# Patient Record
Sex: Female | Born: 1945 | Race: White | Hispanic: No | Smoking: Former smoker
Health system: Southern US, Community
[De-identification: ages and names within clinical notes are randomized; demographics above are authoritative.]

## PROBLEM LIST (undated history)

## (undated) DIAGNOSIS — I272 Pulmonary hypertension, unspecified: Secondary | ICD-10-CM

## (undated) DIAGNOSIS — I2510 Atherosclerotic heart disease of native coronary artery without angina pectoris: Secondary | ICD-10-CM

## (undated) DIAGNOSIS — J439 Emphysema, unspecified: Secondary | ICD-10-CM

## (undated) DIAGNOSIS — K449 Diaphragmatic hernia without obstruction or gangrene: Secondary | ICD-10-CM

## (undated) DIAGNOSIS — J189 Pneumonia, unspecified organism: Secondary | ICD-10-CM

## (undated) DIAGNOSIS — I10 Essential (primary) hypertension: Secondary | ICD-10-CM

## (undated) DIAGNOSIS — M4310 Spondylolisthesis, site unspecified: Secondary | ICD-10-CM

## (undated) DIAGNOSIS — M545 Low back pain, unspecified: Secondary | ICD-10-CM

## (undated) DIAGNOSIS — E039 Hypothyroidism, unspecified: Secondary | ICD-10-CM

## (undated) DIAGNOSIS — IMO0002 Reserved for concepts with insufficient information to code with codable children: Secondary | ICD-10-CM

## (undated) DIAGNOSIS — G8929 Other chronic pain: Secondary | ICD-10-CM

## (undated) DIAGNOSIS — D869 Sarcoidosis, unspecified: Secondary | ICD-10-CM

## (undated) DIAGNOSIS — R251 Tremor, unspecified: Secondary | ICD-10-CM

## (undated) DIAGNOSIS — M7072 Other bursitis of hip, left hip: Secondary | ICD-10-CM

## (undated) DIAGNOSIS — K219 Gastro-esophageal reflux disease without esophagitis: Secondary | ICD-10-CM

## (undated) DIAGNOSIS — M25569 Pain in unspecified knee: Secondary | ICD-10-CM

## (undated) DIAGNOSIS — I2584 Coronary atherosclerosis due to calcified coronary lesion: Secondary | ICD-10-CM

## (undated) DIAGNOSIS — J9611 Chronic respiratory failure with hypoxia: Secondary | ICD-10-CM

## (undated) DIAGNOSIS — E785 Hyperlipidemia, unspecified: Secondary | ICD-10-CM

## (undated) HISTORY — DX: Coronary atherosclerosis due to calcified coronary lesion: I25.84

## (undated) HISTORY — DX: Hyperlipidemia, unspecified: E78.5

## (undated) HISTORY — DX: Essential (primary) hypertension: I10

## (undated) HISTORY — DX: Sarcoidosis, unspecified: D86.9

## (undated) HISTORY — DX: Atherosclerotic heart disease of native coronary artery without angina pectoris: I25.10

## (undated) HISTORY — DX: Low back pain, unspecified: M54.50

## (undated) HISTORY — DX: Spondylolisthesis, site unspecified: M43.10

## (undated) HISTORY — DX: Low back pain: M54.5

## (undated) HISTORY — PX: CARPAL TUNNEL RELEASE: SHX101

## (undated) HISTORY — DX: Other chronic pain: G89.29

## (undated) HISTORY — PX: THYROIDECTOMY: SHX17

## (undated) HISTORY — DX: Gastro-esophageal reflux disease without esophagitis: K21.9

## (undated) HISTORY — DX: Pain in unspecified knee: M25.569

## (undated) HISTORY — PX: ESOPHAGOGASTRODUODENOSCOPY: SHX1529

## (undated) HISTORY — PX: CARDIAC CATHETERIZATION: SHX172

## (undated) HISTORY — DX: Reserved for concepts with insufficient information to code with codable children: IMO0002

## (undated) HISTORY — PX: EYE SURGERY: SHX253

## (undated) HISTORY — DX: Other bursitis of hip, left hip: M70.72

## (undated) HISTORY — PX: COLONOSCOPY: SHX174

---

## 1998-01-09 ENCOUNTER — Observation Stay (HOSPITAL_COMMUNITY): Admission: EM | Admit: 1998-01-09 | Discharge: 1998-01-09 | Payer: Self-pay | Admitting: Emergency Medicine

## 1998-01-10 ENCOUNTER — Ambulatory Visit: Admission: RE | Admit: 1998-01-10 | Discharge: 1998-01-10 | Payer: Self-pay | Admitting: Cardiology

## 1998-02-20 ENCOUNTER — Other Ambulatory Visit: Admission: RE | Admit: 1998-02-20 | Discharge: 1998-02-20 | Payer: Self-pay | Admitting: *Deleted

## 1998-12-31 ENCOUNTER — Ambulatory Visit (HOSPITAL_COMMUNITY): Admission: RE | Admit: 1998-12-31 | Discharge: 1998-12-31 | Payer: Self-pay | Admitting: Cardiology

## 1999-03-18 ENCOUNTER — Encounter (INDEPENDENT_AMBULATORY_CARE_PROVIDER_SITE_OTHER): Payer: Self-pay

## 1999-03-18 ENCOUNTER — Other Ambulatory Visit: Admission: RE | Admit: 1999-03-18 | Discharge: 1999-03-18 | Payer: Self-pay | Admitting: Gastroenterology

## 1999-06-12 ENCOUNTER — Other Ambulatory Visit: Admission: RE | Admit: 1999-06-12 | Discharge: 1999-06-12 | Payer: Self-pay | Admitting: *Deleted

## 1999-09-25 ENCOUNTER — Other Ambulatory Visit: Admission: RE | Admit: 1999-09-25 | Discharge: 1999-09-25 | Payer: Self-pay | Admitting: *Deleted

## 1999-12-13 ENCOUNTER — Encounter: Payer: Self-pay | Admitting: Orthopedic Surgery

## 1999-12-13 ENCOUNTER — Ambulatory Visit (HOSPITAL_COMMUNITY): Admission: RE | Admit: 1999-12-13 | Discharge: 1999-12-13 | Payer: Self-pay | Admitting: Orthopedic Surgery

## 2000-06-23 ENCOUNTER — Other Ambulatory Visit: Admission: RE | Admit: 2000-06-23 | Discharge: 2000-06-23 | Payer: Self-pay | Admitting: *Deleted

## 2001-06-01 ENCOUNTER — Encounter: Payer: Self-pay | Admitting: Otolaryngology

## 2001-06-01 ENCOUNTER — Encounter: Admission: RE | Admit: 2001-06-01 | Discharge: 2001-06-01 | Payer: Self-pay | Admitting: Otolaryngology

## 2001-06-30 ENCOUNTER — Other Ambulatory Visit: Admission: RE | Admit: 2001-06-30 | Discharge: 2001-06-30 | Payer: Self-pay | Admitting: *Deleted

## 2001-08-30 LAB — HM COLONOSCOPY

## 2002-05-27 ENCOUNTER — Ambulatory Visit (HOSPITAL_COMMUNITY): Admission: RE | Admit: 2002-05-27 | Discharge: 2002-05-27 | Payer: Self-pay | Admitting: Internal Medicine

## 2002-05-27 ENCOUNTER — Encounter: Payer: Self-pay | Admitting: Internal Medicine

## 2002-06-03 ENCOUNTER — Encounter: Payer: Self-pay | Admitting: Internal Medicine

## 2002-06-03 ENCOUNTER — Ambulatory Visit (HOSPITAL_COMMUNITY): Admission: RE | Admit: 2002-06-03 | Discharge: 2002-06-03 | Payer: Self-pay | Admitting: Internal Medicine

## 2002-06-17 ENCOUNTER — Encounter: Payer: Self-pay | Admitting: Thoracic Surgery

## 2002-06-20 ENCOUNTER — Ambulatory Visit (HOSPITAL_COMMUNITY): Admission: RE | Admit: 2002-06-20 | Discharge: 2002-06-20 | Payer: Self-pay | Admitting: Thoracic Surgery

## 2002-06-20 ENCOUNTER — Encounter (INDEPENDENT_AMBULATORY_CARE_PROVIDER_SITE_OTHER): Payer: Self-pay | Admitting: *Deleted

## 2002-06-20 ENCOUNTER — Encounter (INDEPENDENT_AMBULATORY_CARE_PROVIDER_SITE_OTHER): Payer: Self-pay | Admitting: Specialist

## 2003-03-26 ENCOUNTER — Encounter: Payer: Self-pay | Admitting: Emergency Medicine

## 2003-03-26 ENCOUNTER — Emergency Department (HOSPITAL_COMMUNITY): Admission: EM | Admit: 2003-03-26 | Discharge: 2003-03-26 | Payer: Self-pay | Admitting: Emergency Medicine

## 2003-08-02 ENCOUNTER — Emergency Department (HOSPITAL_COMMUNITY): Admission: EM | Admit: 2003-08-02 | Discharge: 2003-08-03 | Payer: Self-pay | Admitting: Emergency Medicine

## 2003-08-06 ENCOUNTER — Emergency Department (HOSPITAL_COMMUNITY): Admission: AD | Admit: 2003-08-06 | Discharge: 2003-08-06 | Payer: Self-pay | Admitting: Family Medicine

## 2003-10-06 ENCOUNTER — Observation Stay (HOSPITAL_COMMUNITY): Admission: EM | Admit: 2003-10-06 | Discharge: 2003-10-06 | Payer: Self-pay | Admitting: Emergency Medicine

## 2004-08-12 ENCOUNTER — Ambulatory Visit: Payer: Self-pay | Admitting: Pulmonary Disease

## 2004-09-06 ENCOUNTER — Ambulatory Visit: Payer: Self-pay | Admitting: Internal Medicine

## 2004-10-04 ENCOUNTER — Ambulatory Visit: Payer: Self-pay | Admitting: Internal Medicine

## 2004-10-25 ENCOUNTER — Ambulatory Visit: Payer: Self-pay | Admitting: Pulmonary Disease

## 2004-12-06 ENCOUNTER — Ambulatory Visit: Payer: Self-pay | Admitting: Internal Medicine

## 2004-12-13 ENCOUNTER — Ambulatory Visit: Payer: Self-pay | Admitting: Pulmonary Disease

## 2005-03-14 ENCOUNTER — Ambulatory Visit: Payer: Self-pay | Admitting: Pulmonary Disease

## 2005-06-27 ENCOUNTER — Ambulatory Visit: Payer: Self-pay | Admitting: Endocrinology

## 2005-07-10 ENCOUNTER — Ambulatory Visit: Payer: Self-pay | Admitting: Internal Medicine

## 2005-07-15 ENCOUNTER — Ambulatory Visit: Payer: Self-pay | Admitting: Internal Medicine

## 2005-07-15 ENCOUNTER — Encounter: Admission: RE | Admit: 2005-07-15 | Discharge: 2005-07-15 | Payer: Self-pay | Admitting: Internal Medicine

## 2005-07-30 ENCOUNTER — Ambulatory Visit: Payer: Self-pay | Admitting: Pulmonary Disease

## 2005-10-02 ENCOUNTER — Ambulatory Visit: Payer: Self-pay | Admitting: Internal Medicine

## 2005-10-24 ENCOUNTER — Ambulatory Visit: Payer: Self-pay | Admitting: Pulmonary Disease

## 2006-02-17 ENCOUNTER — Ambulatory Visit: Payer: Self-pay | Admitting: Internal Medicine

## 2006-02-17 ENCOUNTER — Ambulatory Visit (HOSPITAL_COMMUNITY): Admission: RE | Admit: 2006-02-17 | Discharge: 2006-02-17 | Payer: Self-pay | Admitting: Internal Medicine

## 2006-02-23 ENCOUNTER — Ambulatory Visit: Payer: Self-pay | Admitting: Internal Medicine

## 2006-03-06 ENCOUNTER — Ambulatory Visit: Payer: Self-pay | Admitting: Internal Medicine

## 2006-03-06 ENCOUNTER — Ambulatory Visit: Payer: Self-pay | Admitting: Pulmonary Disease

## 2006-03-14 ENCOUNTER — Encounter: Admission: RE | Admit: 2006-03-14 | Discharge: 2006-03-14 | Payer: Self-pay | Admitting: Neurological Surgery

## 2006-04-08 ENCOUNTER — Ambulatory Visit: Payer: Self-pay | Admitting: Internal Medicine

## 2006-07-21 ENCOUNTER — Ambulatory Visit: Payer: Self-pay | Admitting: Emergency Medicine

## 2006-11-23 ENCOUNTER — Ambulatory Visit: Payer: Self-pay | Admitting: Pulmonary Disease

## 2006-11-30 ENCOUNTER — Ambulatory Visit: Payer: Self-pay | Admitting: Internal Medicine

## 2007-02-12 ENCOUNTER — Ambulatory Visit: Payer: Self-pay | Admitting: Internal Medicine

## 2007-03-15 ENCOUNTER — Ambulatory Visit: Payer: Self-pay | Admitting: Internal Medicine

## 2007-03-15 LAB — CONVERTED CEMR LAB
ALT: 32 units/L (ref 0–35)
AST: 27 units/L (ref 0–37)
BUN: 17 mg/dL (ref 6–23)
Blood Glucose, Fasting: 101 mg/dL
CO2: 29 meq/L (ref 19–32)
Calcium: 9.2 mg/dL (ref 8.4–10.5)
Chloride: 98 meq/L (ref 96–112)
Cholesterol: 186 mg/dL (ref 0–200)
Creatinine, Ser: 0.9 mg/dL (ref 0.4–1.2)
GFR calc Af Amer: 82 mL/min
GFR calc non Af Amer: 67 mL/min
Glucose, Bld: 101 mg/dL — ABNORMAL HIGH (ref 70–99)
HDL: 37.9 mg/dL — ABNORMAL LOW (ref >39.0)
Hgb A1c MFr Bld: 5.2 %
Hgb A1c MFr Bld: 5.2 % (ref 4.6–6.0)
LDL Cholesterol: 114 mg/dL — ABNORMAL HIGH (ref 0–99)
Potassium: 4.2 meq/L (ref 3.5–5.1)
Sodium: 135 meq/L (ref 135–145)
TSH: 1.43 microintl units/mL
TSH: 1.43 microintl units/mL (ref 0.35–5.50)
Total CHOL/HDL Ratio: 4.9
Total CK: 61 units/L (ref 7–177)
Triglycerides: 169 mg/dL — ABNORMAL HIGH (ref 0–149)
VLDL: 34 mg/dL (ref 0–40)

## 2007-03-29 ENCOUNTER — Ambulatory Visit: Payer: Self-pay | Admitting: Internal Medicine

## 2007-05-29 ENCOUNTER — Encounter: Payer: Self-pay | Admitting: Internal Medicine

## 2007-05-29 DIAGNOSIS — D869 Sarcoidosis, unspecified: Secondary | ICD-10-CM | POA: Insufficient documentation

## 2007-05-29 DIAGNOSIS — E785 Hyperlipidemia, unspecified: Secondary | ICD-10-CM | POA: Insufficient documentation

## 2007-05-29 DIAGNOSIS — I10 Essential (primary) hypertension: Secondary | ICD-10-CM | POA: Insufficient documentation

## 2007-05-29 DIAGNOSIS — R251 Tremor, unspecified: Secondary | ICD-10-CM | POA: Insufficient documentation

## 2007-05-29 DIAGNOSIS — E039 Hypothyroidism, unspecified: Secondary | ICD-10-CM | POA: Insufficient documentation

## 2007-05-31 ENCOUNTER — Encounter: Payer: Self-pay | Admitting: Internal Medicine

## 2007-05-31 ENCOUNTER — Ambulatory Visit: Payer: Self-pay | Admitting: Internal Medicine

## 2007-05-31 DIAGNOSIS — M519 Unspecified thoracic, thoracolumbar and lumbosacral intervertebral disc disorder: Secondary | ICD-10-CM | POA: Insufficient documentation

## 2007-06-18 ENCOUNTER — Telehealth (INDEPENDENT_AMBULATORY_CARE_PROVIDER_SITE_OTHER): Payer: Self-pay | Admitting: *Deleted

## 2007-08-02 ENCOUNTER — Ambulatory Visit: Payer: Self-pay | Admitting: Internal Medicine

## 2007-08-02 DIAGNOSIS — J329 Chronic sinusitis, unspecified: Secondary | ICD-10-CM | POA: Insufficient documentation

## 2007-11-01 ENCOUNTER — Ambulatory Visit: Payer: Self-pay | Admitting: Internal Medicine

## 2007-11-01 DIAGNOSIS — M25519 Pain in unspecified shoulder: Secondary | ICD-10-CM | POA: Insufficient documentation

## 2008-01-03 ENCOUNTER — Telehealth (INDEPENDENT_AMBULATORY_CARE_PROVIDER_SITE_OTHER): Payer: Self-pay | Admitting: *Deleted

## 2008-01-18 ENCOUNTER — Ambulatory Visit: Payer: Self-pay | Admitting: Internal Medicine

## 2008-01-18 LAB — CONVERTED CEMR LAB
ALT: 35 units/L (ref 0–35)
AST: 31 units/L (ref 0–37)
Albumin: 3.9 g/dL (ref 3.5–5.2)
Alkaline Phosphatase: 51 units/L (ref 39–117)
BUN: 13 mg/dL (ref 6–23)
Bilirubin, Direct: 0.2 mg/dL (ref 0.0–0.3)
CO2: 28 meq/L (ref 19–32)
Calcium: 9.7 mg/dL (ref 8.4–10.5)
Chloride: 105 meq/L (ref 96–112)
Cholesterol: 170 mg/dL (ref 0–200)
Creatinine, Ser: 0.9 mg/dL (ref 0.4–1.2)
GFR calc Af Amer: 81 mL/min
GFR calc non Af Amer: 67 mL/min
Glucose, Bld: 125 mg/dL — ABNORMAL HIGH (ref 70–99)
HDL: 37.1 mg/dL — ABNORMAL LOW (ref >39.0)
LDL Cholesterol: 104 mg/dL — ABNORMAL HIGH (ref 0–99)
Potassium: 4.5 meq/L (ref 3.5–5.1)
Sodium: 142 meq/L (ref 135–145)
TSH: 0.97 microintl units/mL (ref 0.35–5.50)
Total Bilirubin: 1.1 mg/dL (ref 0.3–1.2)
Total CHOL/HDL Ratio: 4.6
Total Protein: 6.9 g/dL (ref 6.0–8.3)
Triglycerides: 143 mg/dL (ref 0–149)
VLDL: 29 mg/dL (ref 0–40)

## 2008-01-26 ENCOUNTER — Ambulatory Visit: Payer: Self-pay | Admitting: Internal Medicine

## 2008-02-24 ENCOUNTER — Ambulatory Visit: Payer: Self-pay | Admitting: Internal Medicine

## 2008-02-24 DIAGNOSIS — R350 Frequency of micturition: Secondary | ICD-10-CM | POA: Insufficient documentation

## 2008-02-24 LAB — CONVERTED CEMR LAB
Bilirubin Urine: NEGATIVE
Blood in Urine, dipstick: NEGATIVE
Glucose, Urine, Semiquant: NEGATIVE
Ketones, urine, test strip: NEGATIVE
Nitrite: NEGATIVE
Protein, U semiquant: NEGATIVE
Specific Gravity, Urine: <1.005
Urobilinogen, UA: 0.2
WBC Urine, dipstick: NEGATIVE
pH: 5

## 2008-04-26 ENCOUNTER — Ambulatory Visit: Payer: Self-pay | Admitting: Internal Medicine

## 2008-04-26 DIAGNOSIS — J018 Other acute sinusitis: Secondary | ICD-10-CM | POA: Insufficient documentation

## 2008-04-26 DIAGNOSIS — K219 Gastro-esophageal reflux disease without esophagitis: Secondary | ICD-10-CM | POA: Insufficient documentation

## 2008-05-04 ENCOUNTER — Encounter: Payer: Self-pay | Admitting: Internal Medicine

## 2008-05-19 ENCOUNTER — Telehealth: Payer: Self-pay | Admitting: Internal Medicine

## 2008-06-01 ENCOUNTER — Ambulatory Visit: Payer: Self-pay | Admitting: Internal Medicine

## 2008-06-01 DIAGNOSIS — N390 Urinary tract infection, site not specified: Secondary | ICD-10-CM | POA: Insufficient documentation

## 2008-06-01 DIAGNOSIS — R1031 Right lower quadrant pain: Secondary | ICD-10-CM | POA: Insufficient documentation

## 2008-06-01 LAB — CONVERTED CEMR LAB
BUN: 14 mg/dL (ref 6–23)
Basophils Absolute: 0 10*3/uL (ref 0.0–0.1)
Basophils Relative: 0 % (ref 0.0–3.0)
Bilirubin Urine: NEGATIVE
Bilirubin Urine: NEGATIVE
Blood in Urine, dipstick: NEGATIVE
CO2: 32 meq/L (ref 19–32)
Calcium: 9.5 mg/dL (ref 8.4–10.5)
Chloride: 102 meq/L (ref 96–112)
Creatinine, Ser: 1 mg/dL (ref 0.4–1.2)
Crystals: NEGATIVE
Eosinophils Absolute: 0.3 10*3/uL (ref 0.0–0.7)
Eosinophils Relative: 3.5 % (ref 0.0–5.0)
GFR calc Af Amer: 72 mL/min
GFR calc non Af Amer: 59 mL/min
Glucose, Bld: 120 mg/dL — ABNORMAL HIGH (ref 70–99)
Glucose, Urine, Semiquant: NEGATIVE
HCT: 43.6 % (ref 36.0–46.0)
Hemoglobin, Urine: NEGATIVE
Hemoglobin: 15.2 g/dL — ABNORMAL HIGH (ref 12.0–15.0)
Ketones, ur: NEGATIVE mg/dL
Ketones, urine, test strip: NEGATIVE
Leukocytes, UA: NEGATIVE
Lymphocytes Relative: 21.3 % (ref 12.0–46.0)
MCHC: 34.9 g/dL (ref 30.0–36.0)
MCV: 89.9 fL (ref 78.0–100.0)
Monocytes Absolute: 0.4 10*3/uL (ref 0.1–1.0)
Monocytes Relative: 3.8 % (ref 3.0–12.0)
Mucus, UA: NEGATIVE
Neutro Abs: 6.9 10*3/uL (ref 1.4–7.7)
Neutrophils Relative %: 71.4 % (ref 43.0–77.0)
Nitrite: NEGATIVE
Nitrite: NEGATIVE
Platelets: 198 10*3/uL (ref 150–400)
Potassium: 3.9 meq/L (ref 3.5–5.1)
Protein, U semiquant: 30
RBC / HPF: NONE SEEN
RBC: 4.85 M/uL (ref 3.87–5.11)
RDW: 12.9 % (ref 11.5–14.6)
Sed Rate: 9 mm/hr (ref 0–22)
Sodium: 142 meq/L (ref 135–145)
Specific Gravity, Urine: <1.005
Specific Gravity, Urine: <=1.005 (ref 1.000–1.03)
Total Protein, Urine: NEGATIVE mg/dL
Urine Glucose: NEGATIVE mg/dL
Urobilinogen, UA: 0.2
Urobilinogen, UA: 0.2 (ref 0.0–1.0)
WBC: 9.6 10*3/uL (ref 4.5–10.5)
pH: 5
pH: 5.5 (ref 5.0–8.0)

## 2008-06-02 ENCOUNTER — Encounter: Payer: Self-pay | Admitting: Internal Medicine

## 2008-06-07 ENCOUNTER — Ambulatory Visit (HOSPITAL_BASED_OUTPATIENT_CLINIC_OR_DEPARTMENT_OTHER): Admission: RE | Admit: 2008-06-07 | Discharge: 2008-06-07 | Payer: Self-pay | Admitting: Internal Medicine

## 2008-06-08 ENCOUNTER — Ambulatory Visit: Payer: Self-pay | Admitting: Internal Medicine

## 2008-06-13 ENCOUNTER — Telehealth: Payer: Self-pay | Admitting: Internal Medicine

## 2008-06-21 ENCOUNTER — Ambulatory Visit: Payer: Self-pay | Admitting: Physical Medicine & Rehabilitation

## 2008-06-28 ENCOUNTER — Encounter: Payer: Self-pay | Admitting: Internal Medicine

## 2008-07-07 ENCOUNTER — Encounter
Admission: RE | Admit: 2008-07-07 | Discharge: 2008-10-05 | Payer: Self-pay | Admitting: Physical Medicine & Rehabilitation

## 2008-07-09 ENCOUNTER — Emergency Department (HOSPITAL_BASED_OUTPATIENT_CLINIC_OR_DEPARTMENT_OTHER): Admission: EM | Admit: 2008-07-09 | Discharge: 2008-07-09 | Payer: Self-pay | Admitting: Emergency Medicine

## 2008-07-10 ENCOUNTER — Ambulatory Visit: Payer: Self-pay | Admitting: Physical Medicine & Rehabilitation

## 2008-07-14 ENCOUNTER — Encounter: Payer: Self-pay | Admitting: Internal Medicine

## 2008-08-08 ENCOUNTER — Ambulatory Visit: Payer: Self-pay | Admitting: Physical Medicine & Rehabilitation

## 2008-08-10 ENCOUNTER — Ambulatory Visit: Payer: Self-pay | Admitting: Internal Medicine

## 2008-08-10 DIAGNOSIS — F32A Depression, unspecified: Secondary | ICD-10-CM | POA: Insufficient documentation

## 2008-08-10 DIAGNOSIS — F329 Major depressive disorder, single episode, unspecified: Secondary | ICD-10-CM | POA: Insufficient documentation

## 2008-08-28 ENCOUNTER — Ambulatory Visit: Payer: Self-pay | Admitting: Physical Medicine & Rehabilitation

## 2008-09-06 ENCOUNTER — Ambulatory Visit: Payer: Self-pay | Admitting: Diagnostic Radiology

## 2008-09-06 ENCOUNTER — Ambulatory Visit (HOSPITAL_BASED_OUTPATIENT_CLINIC_OR_DEPARTMENT_OTHER)
Admission: RE | Admit: 2008-09-06 | Discharge: 2008-09-06 | Payer: Self-pay | Admitting: Physical Medicine & Rehabilitation

## 2008-09-06 ENCOUNTER — Ambulatory Visit: Payer: Self-pay | Admitting: Physical Medicine & Rehabilitation

## 2008-09-19 ENCOUNTER — Encounter
Admission: RE | Admit: 2008-09-19 | Discharge: 2008-09-19 | Payer: Self-pay | Admitting: Physical Medicine and Rehabilitation

## 2008-09-21 ENCOUNTER — Ambulatory Visit: Payer: Self-pay | Admitting: Physical Medicine & Rehabilitation

## 2008-09-29 ENCOUNTER — Encounter: Payer: Self-pay | Admitting: Internal Medicine

## 2008-09-29 ENCOUNTER — Ambulatory Visit (HOSPITAL_COMMUNITY)
Admission: RE | Admit: 2008-09-29 | Discharge: 2008-09-29 | Payer: Self-pay | Admitting: Physical Medicine & Rehabilitation

## 2008-10-16 ENCOUNTER — Encounter: Payer: Self-pay | Admitting: Internal Medicine

## 2008-10-17 ENCOUNTER — Ambulatory Visit: Payer: Self-pay | Admitting: Internal Medicine

## 2008-10-17 DIAGNOSIS — IMO0002 Reserved for concepts with insufficient information to code with codable children: Secondary | ICD-10-CM | POA: Insufficient documentation

## 2008-10-18 ENCOUNTER — Encounter
Admission: RE | Admit: 2008-10-18 | Discharge: 2009-01-16 | Payer: Self-pay | Admitting: Physical Medicine & Rehabilitation

## 2008-10-19 ENCOUNTER — Ambulatory Visit: Payer: Self-pay | Admitting: Physical Medicine & Rehabilitation

## 2008-10-24 ENCOUNTER — Encounter: Payer: Self-pay | Admitting: Internal Medicine

## 2008-11-14 ENCOUNTER — Ambulatory Visit: Payer: Self-pay | Admitting: Physical Medicine & Rehabilitation

## 2008-11-21 ENCOUNTER — Telehealth: Payer: Self-pay | Admitting: Internal Medicine

## 2008-12-12 ENCOUNTER — Ambulatory Visit: Payer: Self-pay | Admitting: Physical Medicine & Rehabilitation

## 2008-12-18 ENCOUNTER — Ambulatory Visit: Payer: Self-pay | Admitting: Internal Medicine

## 2008-12-22 ENCOUNTER — Telehealth: Payer: Self-pay | Admitting: Internal Medicine

## 2009-01-09 ENCOUNTER — Ambulatory Visit: Payer: Self-pay | Admitting: Physical Medicine & Rehabilitation

## 2009-01-10 ENCOUNTER — Encounter: Payer: Self-pay | Admitting: Internal Medicine

## 2009-01-15 ENCOUNTER — Inpatient Hospital Stay (HOSPITAL_COMMUNITY): Admission: RE | Admit: 2009-01-15 | Discharge: 2009-01-18 | Payer: Self-pay | Admitting: Neurosurgery

## 2009-01-15 HISTORY — PX: BACK SURGERY: SHX140

## 2009-01-25 ENCOUNTER — Encounter: Payer: Self-pay | Admitting: Internal Medicine

## 2009-03-20 ENCOUNTER — Ambulatory Visit: Payer: Self-pay | Admitting: Internal Medicine

## 2009-03-26 ENCOUNTER — Ambulatory Visit: Payer: Self-pay | Admitting: Internal Medicine

## 2009-03-26 LAB — CONVERTED CEMR LAB
ALT: 17 units/L (ref 0–35)
AST: 17 units/L (ref 0–37)
Albumin: 4.7 g/dL (ref 3.5–5.2)
Alkaline Phosphatase: 66 units/L (ref 39–117)
BUN: 17 mg/dL (ref 6–23)
Bilirubin, Direct: 0.1 mg/dL (ref 0.0–0.3)
CO2: 25 meq/L (ref 19–32)
Calcium: 10.2 mg/dL (ref 8.4–10.5)
Chloride: 103 meq/L (ref 96–112)
Cholesterol: 238 mg/dL — ABNORMAL HIGH (ref 0–200)
Creatinine, Ser: 0.9 mg/dL (ref 0.40–1.20)
Glucose, Bld: 100 mg/dL — ABNORMAL HIGH (ref 70–99)
HDL: 52 mg/dL (ref >39)
Indirect Bilirubin: 0.4 mg/dL (ref 0.0–0.9)
LDL Cholesterol: 139 mg/dL — ABNORMAL HIGH (ref 0–99)
Potassium: 4.4 meq/L (ref 3.5–5.3)
Sodium: 140 meq/L (ref 135–145)
Total Bilirubin: 0.5 mg/dL (ref 0.3–1.2)
Total CHOL/HDL Ratio: 4.6
Total Protein: 7.4 g/dL (ref 6.0–8.3)
Triglycerides: 233 mg/dL — ABNORMAL HIGH (ref <150)
VLDL: 47 mg/dL — ABNORMAL HIGH (ref 0–40)

## 2009-03-27 ENCOUNTER — Telehealth: Payer: Self-pay | Admitting: Internal Medicine

## 2009-04-19 ENCOUNTER — Encounter: Admission: RE | Admit: 2009-04-19 | Discharge: 2009-04-19 | Payer: Self-pay | Admitting: Neurosurgery

## 2009-04-19 ENCOUNTER — Encounter: Payer: Self-pay | Admitting: Internal Medicine

## 2009-05-07 ENCOUNTER — Encounter: Payer: Self-pay | Admitting: Internal Medicine

## 2009-05-08 ENCOUNTER — Encounter: Payer: Self-pay | Admitting: Internal Medicine

## 2009-06-20 ENCOUNTER — Encounter: Admission: RE | Admit: 2009-06-20 | Discharge: 2009-06-20 | Payer: Self-pay | Admitting: Neurosurgery

## 2009-06-23 ENCOUNTER — Encounter: Admission: RE | Admit: 2009-06-23 | Discharge: 2009-06-23 | Payer: Self-pay | Admitting: Neurosurgery

## 2009-06-25 ENCOUNTER — Telehealth: Payer: Self-pay | Admitting: Internal Medicine

## 2009-07-06 ENCOUNTER — Telehealth: Payer: Self-pay | Admitting: Internal Medicine

## 2009-07-10 ENCOUNTER — Telehealth: Payer: Self-pay | Admitting: Internal Medicine

## 2009-08-07 ENCOUNTER — Ambulatory Visit: Payer: Self-pay | Admitting: Internal Medicine

## 2009-08-13 ENCOUNTER — Telehealth: Payer: Self-pay | Admitting: Internal Medicine

## 2009-08-31 ENCOUNTER — Telehealth: Payer: Self-pay | Admitting: Internal Medicine

## 2009-09-03 ENCOUNTER — Ambulatory Visit: Payer: Self-pay | Admitting: Internal Medicine

## 2009-09-06 ENCOUNTER — Encounter: Payer: Self-pay | Admitting: Internal Medicine

## 2009-09-13 ENCOUNTER — Inpatient Hospital Stay (HOSPITAL_COMMUNITY): Admission: RE | Admit: 2009-09-13 | Discharge: 2009-09-16 | Payer: Self-pay | Admitting: Orthopedic Surgery

## 2009-09-13 HISTORY — PX: JOINT REPLACEMENT: SHX530

## 2009-11-06 ENCOUNTER — Ambulatory Visit: Payer: Self-pay | Admitting: Internal Medicine

## 2009-11-15 ENCOUNTER — Ambulatory Visit: Payer: Self-pay | Admitting: Internal Medicine

## 2009-11-22 ENCOUNTER — Encounter: Admission: RE | Admit: 2009-11-22 | Discharge: 2009-11-22 | Payer: Self-pay | Admitting: Neurosurgery

## 2009-12-07 ENCOUNTER — Encounter: Payer: Self-pay | Admitting: Internal Medicine

## 2009-12-17 ENCOUNTER — Ambulatory Visit: Payer: Self-pay | Admitting: Internal Medicine

## 2009-12-17 DIAGNOSIS — M25559 Pain in unspecified hip: Secondary | ICD-10-CM | POA: Insufficient documentation

## 2010-02-14 ENCOUNTER — Telehealth: Payer: Self-pay | Admitting: Internal Medicine

## 2010-02-21 ENCOUNTER — Ambulatory Visit: Payer: Self-pay | Admitting: Internal Medicine

## 2010-02-21 LAB — CONVERTED CEMR LAB
BUN: 15 mg/dL (ref 6–23)
CO2: 27 meq/L (ref 19–32)
Calcium: 10.2 mg/dL (ref 8.4–10.5)
Chloride: 104 meq/L (ref 96–112)
Creatinine, Ser: 0.97 mg/dL (ref 0.40–1.20)
Glucose, Bld: 124 mg/dL — ABNORMAL HIGH (ref 70–99)
Potassium: 4.5 meq/L (ref 3.5–5.3)
Sodium: 142 meq/L (ref 135–145)
TSH: 0.947 microintl units/mL (ref 0.350–4.500)

## 2010-02-22 ENCOUNTER — Telehealth: Payer: Self-pay | Admitting: Internal Medicine

## 2010-03-11 ENCOUNTER — Encounter: Payer: Self-pay | Admitting: Internal Medicine

## 2010-05-08 ENCOUNTER — Encounter: Payer: Self-pay | Admitting: Internal Medicine

## 2010-05-08 LAB — HM MAMMOGRAPHY: HM Mammogram: NORMAL

## 2010-05-09 ENCOUNTER — Encounter: Payer: Self-pay | Admitting: Internal Medicine

## 2010-05-20 ENCOUNTER — Ambulatory Visit: Payer: Self-pay | Admitting: Internal Medicine

## 2010-05-20 ENCOUNTER — Telehealth: Payer: Self-pay | Admitting: Internal Medicine

## 2010-05-20 ENCOUNTER — Ambulatory Visit: Payer: Self-pay | Admitting: Diagnostic Radiology

## 2010-05-20 ENCOUNTER — Ambulatory Visit (HOSPITAL_BASED_OUTPATIENT_CLINIC_OR_DEPARTMENT_OTHER): Admission: RE | Admit: 2010-05-20 | Discharge: 2010-05-20 | Payer: Self-pay | Admitting: Internal Medicine

## 2010-05-20 DIAGNOSIS — R05 Cough: Secondary | ICD-10-CM | POA: Insufficient documentation

## 2010-05-20 DIAGNOSIS — R059 Cough, unspecified: Secondary | ICD-10-CM | POA: Insufficient documentation

## 2010-05-20 DIAGNOSIS — L259 Unspecified contact dermatitis, unspecified cause: Secondary | ICD-10-CM | POA: Insufficient documentation

## 2010-09-03 NOTE — Letter (Signed)
Summary: Surgical Clearance/[facility]  Surgical Clearance/ Orthopaedics   Imported By: Edmonia James 09/07/2009 11:59:21  _____________________________________________________________________  External Attachment:    Type:   Image     Comment:   External Document

## 2010-09-03 NOTE — Letter (Signed)
Summary: Ezzard Standing MD Cardiology  W Tollie Eth MD Cardiology   Imported By: Edmonia James 09/10/2009 12:46:16  _____________________________________________________________________  External Attachment:    Type:   Image     Comment:   External Document

## 2010-09-03 NOTE — Letter (Signed)
Summary: [facility]   Imported By: Edmonia James 12/26/2009 11:59:41  _____________________________________________________________________  External Attachment:    Type:   Image     Comment:   External Document

## 2010-09-03 NOTE — Assessment & Plan Note (Signed)
Summary: 3 month follow up/mhf--Rm    Vital Signs:  Patient profile:   65 year old female Height:      64 inches Weight:      187 pounds BMI:     32.21 O2 Sat:      95 % on Room air Temp:     98.0 degrees F oral Pulse rate:   64 / minute Pulse rhythm:   regular Resp:     16 per minute BP sitting:   140 / 78  (left arm) Cuff size:   regular  Vitals Entered By: Kelle Darting CMA Deborra Medina) (May 20, 2010 10:17 AM)  O2 Flow:  Room air CC: 3 month follow up. Pt states she has history of sarcoidosis and has been coughing, has been out of Albuterol. Pt would like flu shot today. Is Patient Diabetic? No Pain Assessment Patient in pain? no        Primary Care Provider:  Jennings Books DO  CC:  3 month follow up. Pt states she has history of sarcoidosis and has been coughing and has been out of Albuterol. Pt would like flu shot today.Marland Kitchen  History of Present Illness: 65 y/o female  c/o intermittent cough,  worse during day some SOB at night chest feels heavy  hx of GERD still has symptoms 1-2 x per week  Preventive Screening-Counseling & Management  Alcohol-Tobacco     Alcohol drinks/day: 0     Smoking Status: quit  Allergies: 1)  ! Pcn 2)  ! Flagyl 3)  ! Crestor 4)  Lyrica (Pregabalin)  Past History:  Past Medical History: Sarcoidosis Hyperlipidemia  GERD   Hypothyroidism   Hypertension Chronic Low Back Pain -        1. 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   2. 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        Physical Exam  General:  alert, well-developed, and well-nourished.   Ears:  R ear normal and L ear normal.   Lungs:  normal respiratory effort and normal breath sounds.   Heart:  normal rate, regular rhythm, and no gallop.   Neurologic:  cranial nerves II-XII intact and gait normal.     Impression & Recommendations:  Problem # 1:  COUGH (ICD-786.2) Assessment Unchanged intermittent cough.  CXR  negative.  question exacerbated by GERD encouraged antireflux measures  Orders: T-2 View CXR, Same Day (71020.5TC)  Problem # 2:  ECZEMA (CNO-709.9)  Her updated medication list for this problem includes:    Triamcinolone Acetonide 0.1 % Crea (Triamcinolone acetonide) .Marland Kitchen... Apply two times a day  Problem # 3:  HYPERTENSION, CONTROLLED (ICD-401.1) Assessment: Unchanged  Her updated medication list for this problem includes:    Hydrochlorothiazide 25 Mg Tabs (Hydrochlorothiazide) ..... One by mouth once daily    Bisoprolol Fumarate 5 Mg Tabs (Bisoprolol fumarate) ..... One by mouth once daily  BP today: 140/78 Prior BP: 130/80 (02/21/2010)  Labs Reviewed: K+: 4.5 (02/21/2010) Creat: : 0.97 (02/21/2010)   Chol: 238 (03/26/2009)   HDL: 52 (03/26/2009)   LDL: 139 (03/26/2009)   TG: 233 (03/26/2009)  Complete Medication List: 1)  Hydrochlorothiazide 25 Mg Tabs (Hydrochlorothiazide) .... One by mouth once daily 2)  Levothyroxine Sodium 100 Mcg Tabs (Levothyroxine sodium) .... Take 1 tablet by mouth once a day 3)  Bisoprolol Fumarate 5 Mg Tabs (Bisoprolol fumarate) .... One by mouth once daily 4)  Vytorin 10-10 Mg Tabs (Ezetimibe-simvastatin) .... One by mouth once daily  5)  Low-dose Aspirin 81 Mg Tabs (Aspirin) .... One by mouth once daily 6)  Prilosec 20 Mg Cpdr (Omeprazole) .... One by mouth two times a day 7)  Vitamin D 1000 Unit Tabs (Cholecalciferol) .... 2 daily 8)  Metamucil Plus Calcium Caps (Psyllium-calcium) .... Take 1 capsule by mouth two times a day 9)  Oxycodone-acetaminophen 5-325 Mg Tabs (Oxycodone-acetaminophen) .... One by mouth two times a day as needed 10)  Fish Oil 1200 Mg Caps (Omega-3 fatty acids) .... 2 capsules by mouth once daily 11)  Cinnamon 500 Mg Tabs (Cinnamon) .... 2000 mg by mouth once daily 12)  Gabapentin 100 Mg Caps (Gabapentin) .... Take 3 times per day as directed 13)  Tramadol Hcl 50 Mg Tabs (Tramadol hcl) .... One by mouth once daily prn 14)   Celebrex 200 Mg Caps (Celecoxib) .... Take 1 tablet by mouth two times a day 15)  Triamcinolone Acetonide 0.1 % Crea (Triamcinolone acetonide) .... Apply two times a day 16)  Fluticasone Propionate 50 Mcg/act Susp (Fluticasone propionate) .... 2 sprays each nostril qd  Other Orders: Influenza Vaccine MCR (63817) Administration Flu vaccine - MCR (R1165)  Patient Instructions: 1)  Decrease you intake of fatty and heavy protein meals (especially in the evening) 2)  Please schedule a follow-up appointment in 4 months. 3)  Call our office if your cough does not  improve or gets worse. Prescriptions: FLUTICASONE PROPIONATE 50 MCG/ACT SUSP (FLUTICASONE PROPIONATE) 2 sprays each nostril qd  #1 x 3   Entered and Authorized by:   D. Drema Pry DO   Signed by:   D. Drema Pry DO on 05/20/2010   Method used:   Electronically to        [facility] (retail)       [address]       [place], [place]  [address]       Ph: [phone]       Fax: [phone]   RxID:   [id number] TRIAMCINOLONE ACETONIDE 0.1 % CREA (TRIAMCINOLONE ACETONIDE) apply two times a day  #15 grams x 2   Entered and Authorized by:   D. Drema Pry DO   Signed by:   D. Drema Pry DO on 05/20/2010   Method used:   Electronically to        [facility] (retail)       [address]       [place], [place]  [address]       Ph: [phone]       Fax: [phone]   RxID:   [id number]    Orders Added: 1)  T-2 View CXR, Same Day [71020.5TC] 2)  Influenza Vaccine MCR [00025] 3)  Administration Flu vaccine - MCR [T0211] 4)  Est. Patient Level III [17356]   Immunizations Administered:  Influenza Vaccine # 1:    Vaccine Type: Fluvax MCR    Site: left deltoid    Mfr: GlaxoSmithKline    Dose: 0.5 ml    Route: IM    Given by: Jiles Garter CMA    Exp. Date: 02/01/2011    Lot #: [id number]    VIS given: 02/26/10 version given May 20, 2010.  Flu  Vaccine Consent Questions:    Do you have a history of severe allergic reactions to this vaccine? no    Any prior history of allergic reactions to egg and/or gelatin?  no    Do you have a sensitivity to the preservative Thimersol? no    Do you have a past history of Guillan-Barre Syndrome? no    Do you currently have an acute febrile illness? no    Have you ever had a severe reaction to latex? no    Vaccine information given and explained to patient? yes    Are you currently pregnant? no   Immunizations Administered:  Influenza Vaccine # 1:    Vaccine Type: Fluvax MCR    Site: left deltoid    Mfr: GlaxoSmithKline    Dose: 0.5 ml    Route: IM    Given by: Jiles Garter CMA    Exp. Date: 02/01/2011    Lot #: [id number]    VIS given: 02/26/10 version given May 20, 2010.   Current Allergies (reviewed today): ! PCN ! FLAGYL ! CRESTOR LYRICA (PREGABALIN)

## 2010-09-03 NOTE — Miscellaneous (Signed)
Summary: mammogram  Clinical Lists Changes  Observations: Added new observation of MAMMOGRAM: normal (05/08/2010 9:42)      Preventive Care Screening  Mammogram:    Date:  05/08/2010    Results:  normal

## 2010-09-03 NOTE — Progress Notes (Signed)
Summary: Levothyroxine Refill  Phone Note Refill Request Message from:  Fax from Pharmacy  Refills Requested: Medication #1:  LEVOTHYROXINE SODIUM 100 MCG TABS Take 1 tablet by mouth once a day   Dosage confirmed as above?Dosage Confirmed   Brand Name Necessary? No   Supply Requested: 1 month   Last Refilled: 07/10/2009  Method Requested: Electronic Next Appointment Scheduled: 10-09-09 1030 am Dr Shawna Orleans  Initial call taken by: Shanon Payor,  August 13, 2009 12:59 PM  Follow-up for Phone Call        Rx sent electronically to pharmacy Follow-up by: Jiles Garter CMA,  August 13, 2009 4:38 PM    Prescriptions: LEVOTHYROXINE SODIUM 100 MCG TABS (LEVOTHYROXINE SODIUM) Take 1 tablet by mouth once a day  #30 x 3   Entered by:   Jiles Garter CMA   Authorized by:   D. Drema Pry DO   Signed by:   Jiles Garter CMA on 08/13/2009   Method used:   Electronically to        [facility] (retail)       [address]       [place], [place]  [address]       Ph: [phone]       Fax: [phone]   RxID:   [id number]

## 2010-09-03 NOTE — Letter (Signed)
Summary: [facility]   Imported By: Edmonia James 03/22/2010 10:13:58  _____________________________________________________________________  External Attachment:    Type:   Image     Comment:   External Document

## 2010-09-03 NOTE — Assessment & Plan Note (Signed)
Summary: 2 MONTH FOLLOW /MHF   Vital Signs:  Patient profile:   65 year old female Height:      64 inches Weight:      178.25 pounds BMI:     30.71 O2 Sat:      98 % on Room air Temp:     97.9 degrees F oral Pulse rate:   64 / minute Pulse rhythm:   regular Resp:     18 per minute BP sitting:   142 / 90  (right arm) Cuff size:   large  Vitals Entered By: Jiles Garter CMA (November 06, 2009 10:30 AM)  O2 Flow:  Room air  Primary Care Provider:  D. Drema Pry DO   History of Present Illness: 65 y/o white female for follow up she had right hip surgery in feb still having significant pain in right leg not sure if pain coming from hip or radicular pain from low back  Allergies: 1)  ! Pcn 2)  ! Flagyl 3)  ! Crestor 4)  Lyrica (Pregabalin)  Past History:  Past Medical History: Sarcoidosis Hyperlipidemia GERD   Hypothyroidism  Hypertension Chronic Low Back Pain -      1. 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   2. 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        Past Surgical History: Carpal tunnel release Thyroidectomy        January 15, 2009  1. L3-4 and L4-5 decompressive laminectomy with bilateral L3, L4, and       L5 decompressive foraminotomies, more than it would be required for       simple interbody fusion alone.   2. L3-4 and L4-5 posterior lumbar interbody fusion utilizing tangent       interbody allograft wedge, Telamon interbody PEEK cage, and local       autografting.   3. L3, L4, and L5 posterolateral arthrodesis using segmental pedicle       screw fixation and local autografting.   Family History: The patient's mother had breast cancer diagnosed at age 27 (28 to 25 years ago); she is deceased. Mother also had elevated cholesterol, heart disease, and high blood pressure. She did have a myocardial infarction in the past, unknown age. Pt was raised by her grandparents         Social History: Married-  70 years and has 2 children (2 daughters) Alcohol use-no Former Smoker - she quit 10 years ago, started when she was 77 and has had varying level of use of tobacco products from 1/2 pack to 3 packs per day.             Physical Exam  General:  alert, well-developed, and well-nourished.   Lungs:  normal respiratory effort and normal breath sounds.   Heart:  normal rate, regular rhythm, and no gallop.   Msk:  right lateral hip tenderness,  no pain with hip rotation Neurologic:  cranial nerves II-XII intact.  ambulates with cane Psych:  normally interactive, good eye contact, not anxious appearing, and not depressed appearing.     Impression & Recommendations:  Problem # 1:  LUMBAR RADICULOPATHY, RIGHT (ICD-724.4) Pt having persistent right sided leg/hip pain.  her ortho does not feels symptoms related to her hip.  pain with weight bearing.  restart fentanyl patch  The following medications were removed from the medication list:    Metaxalone 800 Mg Tabs (Metaxalone) ..... One by mouth three times a day  Oxycontin 20 Mg Xr12h-tab (Oxycodone hcl) ..... One by mouth two times a day Her updated medication list for this problem includes:    Low-dose Aspirin 81 Mg Tabs (Aspirin) ..... One by mouth once daily    Fentanyl 50 Mcg/hr Pt72 (Fentanyl) .Marland Kitchen... Apply new patch q 48 hrs    Oxycodone-acetaminophen 5-325 Mg Tabs (Oxycodone-acetaminophen) ..... One by mouth two times a day as needed  Complete Medication List: 1)  Hydrochlorothiazide 25 Mg Tabs (Hydrochlorothiazide) .... One by mouth once daily 2)  Levothyroxine Sodium 100 Mcg Tabs (Levothyroxine sodium) .... Take 1 tablet by mouth once a day 3)  Propranolol Hcl Cr 120 Mg Xr24h-cap (Propranolol hcl) .... One by mouth once daily 4)  Vytorin 10-20 Mg Tabs (Ezetimibe-simvastatin) .... Take one  tablet by mouth every night 5)  Low-dose Aspirin 81 Mg Tabs (Aspirin) .... One by mouth once daily 6)  Prilosec 20 Mg Cpdr (Omeprazole) .... One by  mouth two times a day 7)  Vitamin D 1000 Unit Tabs (Cholecalciferol) .... 2 daily 8)  Metamucil Plus Calcium Caps (Psyllium-calcium) .... Take 1 capsule by mouth two times a day 9)  Fentanyl 50 Mcg/hr Pt72 (Fentanyl) .... Apply new patch q 48 hrs 10)  Oxycodone-acetaminophen 5-325 Mg Tabs (Oxycodone-acetaminophen) .... One by mouth two times a day as needed  Patient Instructions: 1)  Please schedule a follow-up appointment in 2 weeks. Prescriptions: FENTANYL 25 MCG/HR PT72 (FENTANYL) apply patch q 3 days  #10 x 0   Entered and Authorized by:   D. Drema Pry DO   Signed by:   D. Drema Pry DO on 11/06/2009   Method used:   Print then Give to Patient   RxID:   [id number] OXYCONTIN 20 MG XR12H-TAB (OXYCODONE HCL) one by mouth two times a day  #60 x 0   Entered and Authorized by:   D. Drema Pry DO   Signed by:   D. Drema Pry DO on 11/06/2009   Method used:   Print then Give to Patient   RxID:   [id number]   Current Allergies (reviewed today): ! PCN ! FLAGYL ! CRESTOR LYRICA (PREGABALIN)

## 2010-09-03 NOTE — Assessment & Plan Note (Signed)
Summary: 1 month fu/dt   Vital Signs:  Patient profile:   65 year old female Height:      64 inches Weight:      182.75 pounds BMI:     31.48 O2 Sat:      94 % on Room air Temp:     98.0 degrees F oral Pulse rate:   62 / minute Pulse rhythm:   regular Resp:     18 per minute BP sitting:   132 / 80  (right arm) Cuff size:   large  Vitals Entered By: Jiles Garter CMA (Dec 17, 2009 2:31 PM)  O2 Flow:  Room air CC: Rm 3- 1 Month Follow up   Primary Care Provider:  Jennings Books DO  CC:  Rm 3- 1 Month Follow up.  History of Present Illness:  65 year old white female for followup regarding low back pain and right hip pain. pt seen by ortho.  no hip pathology identified pain is localized to right buttock worse with prolonged sitting or standing there is question of nerve damage  fentanyl patches not helping  Allergies: 1)  ! Pcn 2)  ! Flagyl 3)  ! Crestor 4)  Lyrica (Pregabalin)  Past History:  Past Medical History: Sarcoidosis Hyperlipidemia GERD  Hypothyroidism  Hypertension Chronic Low Back Pain -        1. 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   2. 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        Past Surgical History: Carpal tunnel release Thyroidectomy        January 15, 2009  1. L3-4 and L4-5 decompressive laminectomy with bilateral L3, L4, and       L5 decompressive foraminotomies, more than it would be required for       simple interbody fusion alone.   2. L3-4 and L4-5 posterior lumbar interbody fusion utilizing tangent       interbody allograft wedge, Telamon interbody PEEK cage, and local       autografting.   3. L3, L4, and L5 posterolateral arthrodesis using segmental pedicle       screw fixation and local autografting.  Right hip replacement - Dr. Alvan Dame 09/13/2009   Family History: The patient's mother had breast cancer diagnosed at age 102 (73 to 45 years ago); she is deceased. Mother also had  elevated cholesterol, heart disease, and high blood pressure. She did have a myocardial infarction in the past, unknown age. Pt was raised by her grandparents          Social History: Married- 57 years and has 2 children (2 daughters) Alcohol use-no Former Smoker - she quit 10 years ago, started when she was 1 and has had varying level of use of tobacco products from 1/2 pack to 3 packs per day.              Physical Exam  General:  alert, well-developed, and well-nourished.   Lungs:  normal respiratory effort and normal breath sounds.   Heart:  normal rate, regular rhythm, and no gallop.   Msk:  right buttock pain Neurologic:  cranial nerves II-XII intact  ambulates with cane   Impression & Recommendations:  Problem # 1:  HIP PAIN, RIGHT, CHRONIC (ICD-719.45) fentanyl patch not helping.  ortho states hip prosthesis not cause of pain.  consider piriformis syndrome Taper off fentanyl patch.   we reviewed stretching exercises and massage. there is question of nerve damage.  trial of low  dose gabapentin. use tramadol as needed Her updated medication list for this problem includes:    Low-dose Aspirin 81 Mg Tabs (Aspirin) ..... One by mouth once daily    Fentanyl 25 Mcg/hr Pt67 (Fentanyl) .Marland Kitchen... Change q 48-72 hrs as directed    Oxycodone-acetaminophen 5-325 Mg Tabs (Oxycodone-acetaminophen) ..... One by mouth two times a day as needed    Tramadol Hcl 50 Mg Tabs (Tramadol hcl) ..... One by mouth once daily prn  Complete Medication List: 1)  Hydrochlorothiazide 25 Mg Tabs (Hydrochlorothiazide) .... One by mouth once daily 2)  Levothyroxine Sodium 100 Mcg Tabs (Levothyroxine sodium) .... Take 1 tablet by mouth once a day 3)  Propranolol Hcl Cr 120 Mg Xr24h-cap (Propranolol hcl) .... One by mouth once daily 4)  Vytorin 10-20 Mg Tabs (Ezetimibe-simvastatin) .... Take one  tablet by mouth every night 5)  Low-dose Aspirin 81 Mg Tabs (Aspirin) .... One by mouth once daily 6)  Prilosec 20  Mg Cpdr (Omeprazole) .... One by mouth two times a day 7)  Vitamin D 1000 Unit Tabs (Cholecalciferol) .... 2 daily 8)  Metamucil Plus Calcium Caps (Psyllium-calcium) .... Take 1 capsule by mouth two times a day 9)  Fentanyl 25 Mcg/hr Pt67 (Fentanyl) .... Change q 48-72 hrs as directed 10)  Oxycodone-acetaminophen 5-325 Mg Tabs (Oxycodone-acetaminophen) .... One by mouth two times a day as needed 11)  Fish Oil 1200 Mg Caps (Omega-3 fatty acids) .... 2 capsules by mouth once daily 12)  Cinnamon 500 Mg Tabs (Cinnamon) .... 2000 mg by mouth once daily 13)  Gabapentin 100 Mg Caps (Gabapentin) .... Take 3 times per day as directed 14)  Tramadol Hcl 50 Mg Tabs (Tramadol hcl) .... One by mouth once daily prn  Patient Instructions: 1)  Please schedule a follow-up appointment in 2 months. Prescriptions: TRAMADOL HCL 50 MG TABS (TRAMADOL HCL) one by mouth once daily prn  #30 x 1   Entered and Authorized by:   D. Drema Pry DO   Signed by:   D. Drema Pry DO on 12/17/2009   Method used:   Electronically to        [facility] (retail)       [address]       [place], [place]  [address]       Ph: [phone]       Fax: [phone]   RxID:   [id number] FENTANYL 25 MCG/HR PT72 (FENTANYL) change q 48-72 hrs as directed  #15 x 0   Entered and Authorized by:   D. Drema Pry DO   Signed by:   D. Drema Pry DO on 12/17/2009   Method used:   Print then Give to Patient   RxID:   [id number] GABAPENTIN 100 MG CAPS (GABAPENTIN) take 3 times per day as directed  #90 x 1   Entered and Authorized by:   D. Drema Pry DO   Signed by:   D. Drema Pry DO on 12/17/2009   Method used:   Electronically to        [facility] (retail)       [address]       [place],   [address]       Ph: [phone]       Fax: [phone]   RxID:   [id number]   Current Allergies (reviewed today): ! PCN ! FLAGYL ! CRESTOR  LYRICA  (PREGABALIN)

## 2010-09-03 NOTE — Progress Notes (Signed)
Summary: Test Results  Phone Note Outgoing Call   Summary of Call: call pt - no sign of pneumonia or other acute process Initial call taken by: D. Drema Pry DO,  May 20, 2010 3:45 PM  Follow-up for Phone Call        call placed to patient at [phone], she has been advised per Dr Shawna Orleans instructions. Follow-up by: Jiles Garter CMA,  May 20, 2010 4:41 PM

## 2010-09-03 NOTE — Assessment & Plan Note (Signed)
Summary: SURGICAL CLEARANCE/DK   Vital Signs:  Patient profile:   65 year old female Height:      64 inches Weight:      175.50 pounds BMI:     30.23 O2 Sat:      97 % on Room air Temp:     98.0 degrees F oral Pulse rate:   66 / minute Pulse rhythm:   regular Resp:     18 per minute BP sitting:   124 / 74  (right arm) Cuff size:   large  Vitals Entered By: Jiles Garter CMA (September 03, 2009 2:51 PM)  O2 Flow:  Room air  Primary Care Provider:  D. Drema Pry DO  CC:  Surgical Clearance and Pre-op Evaluation.  History of Present Illness: Pre-Op Evaluation      65 y/o white female is scheduled for right hip replacement with Dr. Alvan Dame 09/13/2009.  The patient denies respiratory symptoms, GI bleeding, and chest pain.  Patient has no history of diabetes(m).  She tolerated her back surgery well.  no previous reaction to anesthesia.  She has already seen her cardiologist.  Chemical stress test planned.   Allergies: 1)  ! Pcn 2)  ! Flagyl 3)  ! Crestor 4)  Lyrica (Pregabalin)  Past History:  Past Medical History: Sarcoidosis Hyperlipidemia GERD  Hypothyroidism  Hypertension Chronic Low Back Pain -      1. 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   2. 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        Past Surgical History: Carpal tunnel release Thyroidectomy       January 15, 2009  1. L3-4 and L4-5 decompressive laminectomy with bilateral L3, L4, and       L5 decompressive foraminotomies, more than it would be required for       simple interbody fusion alone.   2. L3-4 and L4-5 posterior lumbar interbody fusion utilizing tangent       interbody allograft wedge, Telamon interbody PEEK cage, and local       autografting.   3. L3, L4, and L5 posterolateral arthrodesis using segmental pedicle       screw fixation and local autografting.   Family History: The patient's mother had breast cancer diagnosed at age 51 (9 to 26  years ago); she is deceased. Mother also had elevated cholesterol, heart disease, and high blood pressure. She did have a myocardial infarction in the past, unknown age. Pt was raised by her grandparents        Social History: Married- 12 years and has 2 children (2 daughters) Alcohol use-no Former Smoker - she quit 10 years ago, started when she was 33 and has had varying level of use of tobacco products from 1/2 pack to 3 packs per day.            Physical Exam  General:  alert, well-developed, and well-nourished.   Neck:  supple and no carotid bruits.   Lungs:  normal respiratory effort, normal breath sounds, no crackles, and no wheezes.   Heart:  normal rate, regular rhythm, and no gallop.   Extremities:  No lower extremity edema  Neurologic:  cranial nerves II-XII intact.   Psych:  normally interactive, good eye contact, not anxious appearing, and not depressed appearing.     Impression & Recommendations:  Problem # 1:  PREOPERATIVE EXAMINATION (ICD-V72.84) 65 y/o for pre op exam for upcoming right hip replacement.   Dr. Wynonia Lawman has arranged cardiac  stress testing.  Pt is low surgical risk.  She tolerated back surgery well.     Instructions for holding vytorin 24 hrs before surgery reviewed.    Complete Medication List: 1)  Hydrochlorothiazide 25 Mg Tabs (Hydrochlorothiazide) .... One by mouth once daily 2)  Levothyroxine Sodium 100 Mcg Tabs (Levothyroxine sodium) .... Take 1 tablet by mouth once a day 3)  Propranolol Hcl Cr 120 Mg Xr24h-cap (Propranolol hcl) .... One by mouth once daily 4)  Vytorin 10-20 Mg Tabs (Ezetimibe-simvastatin) .... Take one  tablet by mouth every night 5)  Low-dose Aspirin 81 Mg Tabs (Aspirin) .... One by mouth once daily 6)  Prilosec 20 Mg Cpdr (Omeprazole) .... One by mouth two times a day 7)  Vitamin D 1000 Unit Tabs (Cholecalciferol) .... 2 daily 8)  Metamucil Plus Calcium Caps (Psyllium-calcium) .... Take 1 capsule by mouth two times a day 9)   Percocet 5-325 Mg Tabs (Oxycodone-acetaminophen) .Marland Kitchen.. 1 tablet by mouth every 4-6 hours as needed 10)  Metaxalone 800 Mg Tabs (Metaxalone) .... One by mouth three times a day  Patient Instructions: 1)  Hold vytorin 24 hrs before hip surgery. 2)  Please schedule a follow-up appointment in 3 months.  Current Allergies (reviewed today): ! PCN ! FLAGYL ! CRESTOR LYRICA (PREGABALIN)

## 2010-09-03 NOTE — Assessment & Plan Note (Signed)
Summary: 4 MONTH FOLLOW UP/MHF rsc with pt /mhf, RESCHED- JR   Vital Signs:  Patient profile:   65 year old female Weight:      175 pounds BMI:     30.15 O2 Sat:      97 % on Room air Temp:     97.8 degrees F oral Pulse rate:   60 / minute Pulse rhythm:   regular Resp:     16 per minute BP sitting:   120 / 78  (right arm) Cuff size:   large  Vitals Entered By: Jiles Garter CMA (August 07, 2009 2:41 PM)  O2 Flow:  Room air  Primary Care Provider:  D. Drema Pry DO  CC:  4 Month Follow up.  History of Present Illness: 4 Month follow up disease management   65 y/o female for f/u re:   back pain.  overall back pain is better but still having tight sensation and stiffness.  she used darvocet to take edge but but medication taken off the market.  pt planning f/u with neurosurgeon.  Allergies: 1)  ! Pcn 2)  ! Flagyl 3)  ! Crestor 4)  Lyrica (Pregabalin)  Past History:  Past Medical History: Sarcoidosis Hyperlipidemia GERD Hypothyroidism  Hypertension Chronic Low Back Pain -      1. 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   2. 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        Family History: The patient's mother had breast cancer diagnosed at age 29 (74 to 63 years ago); she is deceased. Mother also had elevated cholesterol, heart disease, and high blood pressure. She did have a myocardial infarction in the past, unknown age. Pt was raised by her grandparents       Social History: Married- 6 years and has 2 children (2 daughters) Alcohol use-no Former Smoker - she quit 10 years ago, started when she was 54 and has had varying level of use of tobacco products from 1/2 pack to 3 packs per day.           Physical Exam  General:  alert, well-developed, and well-nourished.   Lungs:  normal respiratory effort and normal breath sounds.   Heart:  normal rate, regular rhythm, no murmur, and no gallop.   Extremities:  No  lower extremity edema    Impression & Recommendations:  Problem # 1:  LUMBAR RADICULOPATHY, RIGHT (ICD-724.4) Pt having recurrent low back pain despite surgical decompression.    use tramadol as needed.   f/u with neurosurgeon.  The following medications were removed from the medication list:    Darvocet-n 100 100-650 Mg Tabs (Propoxyphene n-apap) .Marland Kitchen... Take 1 tablet by mouth three times a day Her updated medication list for this problem includes:    Low-dose Aspirin 81 Mg Tabs (Aspirin) ..... One by mouth once daily    Percocet 5-325 Mg Tabs (Oxycodone-acetaminophen) .Marland Kitchen... 1 tablet by mouth every 4-6 hours as needed    Metaxalone 800 Mg Tabs (Metaxalone) ..... One by mouth three times a day    Tramadol Hcl 50 Mg Tabs (Tramadol hcl) ..... One by mouth  two times a day prn  Complete Medication List: 1)  Hydrochlorothiazide 25 Mg Tabs (Hydrochlorothiazide) .... One by mouth once daily 2)  Levothyroxine Sodium 100 Mcg Tabs (Levothyroxine sodium) .... Take 1 tablet by mouth once a day 3)  Propranolol Hcl Cr 120 Mg Xr24h-cap (Propranolol hcl) .... One by mouth once daily 4)  Vytorin  10-20 Mg Tabs (Ezetimibe-simvastatin) .... Take one  tablet by mouth every night 5)  Low-dose Aspirin 81 Mg Tabs (Aspirin) .... One by mouth once daily 6)  Prilosec 20 Mg Cpdr (Omeprazole) .... One by mouth two times a day 7)  Vitamin D 1000 Unit Tabs (Cholecalciferol) .... 2 daily 8)  Metamucil Plus Calcium Caps (Psyllium-calcium) .... Take 1 capsule by mouth two times a day 9)  Percocet 5-325 Mg Tabs (Oxycodone-acetaminophen) .Marland Kitchen.. 1 tablet by mouth every 4-6 hours as needed 10)  Metaxalone 800 Mg Tabs (Metaxalone) .... One by mouth three times a day 11)  Tramadol Hcl 50 Mg Tabs (Tramadol hcl) .... One by mouth  two times a day prn  Patient Instructions: 1)  Please schedule a follow-up appointment in 2 months. Prescriptions: TRAMADOL HCL 50 MG TABS (TRAMADOL HCL) one by mouth  two times a day prn  #60 x 2    Entered and Authorized by:   D. Drema Pry DO   Signed by:   D. Drema Pry DO on 08/07/2009   Method used:   Electronically to        [facility] (retail)       [address]       [place], [place]  [address]       Ph: [phone]       Fax: [phone]   RxID:   [id number] PROPRANOLOL HCL CR 120 MG XR24H-CAP (PROPRANOLOL HCL) one by mouth once daily  #30 x 5   Entered and Authorized by:   D. Drema Pry DO   Signed by:   D. Drema Pry DO on 08/07/2009   Method used:   Electronically to        [facility] (retail)       [address]       [place], [place]  [address]       Ph: [phone]       Fax: [phone]   RxID:   [id number] METAXALONE 800 MG TABS (METAXALONE) one by mouth three times a day  #30 x 1   Entered and Authorized by:   D. Drema Pry DO   Signed by:   D. Drema Pry DO on 08/07/2009   Method used:   Electronically to        [facility] (retail)       [address]       [place],   [address]       Ph: [phone]       Fax: [phone]   RxID:   [id number]   Current Allergies (reviewed today): ! PCN ! FLAGYL ! CRESTOR LYRICA (PREGABALIN)   Immunization History:  Influenza Immunization History:    Influenza:  historical (05/23/2009)

## 2010-09-03 NOTE — Progress Notes (Signed)
Summary: Surgical Clearance  Phone Note Call from Patient Call back at Home Phone [phone]   Caller: Patient Summary of Call: patient called and left voice message stating she is scheduled to have hip surgery ,and the surgeons office will be faxing a form over for clearance. She would like to know if she will need to return for office visit. Initial call taken by: Jiles Garter CMA,  August 31, 2009 1:39 PM  Follow-up for Phone Call        yes, I can not provide surgical clearance w/o OV Follow-up by: D. Drema Pry DO,  August 31, 2009 5:07 PM  Additional Follow-up for Phone Call Additional follow up Details #1::        patient advised per Dr Shawna Orleans instructions appt scheduled for 1/31 @ 3pm Additional Follow-up by: Jiles Garter CMA,  August 31, 2009 5:30 PM

## 2010-09-03 NOTE — Assessment & Plan Note (Signed)
Summary: 2 month follow up/mhf   Vital Signs:  Patient profile:   65 year old female Weight:      183.75 pounds BMI:     31.65 O2 Sat:      95 % on Room air Temp:     97.8 degrees F oral Pulse rate:   66 / minute Pulse rhythm:   regular Resp:     16 per minute BP sitting:   130 / 80  (left arm) Cuff size:   large  Vitals Entered By: Jiles Garter CMA (February 21, 2010 2:58 PM)  O2 Flow:  Room air CC: Rm 3- 2 Month Follow up  Is Patient Diabetic? No Pain Assessment Patient in pain? no       Does patient need assistance? Functional Status Self care Ambulation Normal     Last PAP Result Declined   Primary Care Provider:  DDrema Pry DO  CC:  Rm 3- 2 Month Follow up .  History of Present Illness: 65 y/o female for follow up   leg pain improved, gabapentin helps, she states that she currenty taking two times a day   she is also concerned that she is retaining fluid or wt gain  Dr Annette Stable is advises patient to have a nerve block,   Preventive Screening-Counseling & Management  Alcohol-Tobacco     Smoking Status: quit  Allergies: 1)  ! Pcn 2)  ! Flagyl 3)  ! Crestor 4)  Lyrica (Pregabalin)  Past History:  Past Medical History: Sarcoidosis Hyperlipidemia GERD   Hypothyroidism  Hypertension Chronic Low Back Pain -        1. 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   2. 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        Past Surgical History: Carpal tunnel release Thyroidectomy         January 15, 2009  1. L3-4 and L4-5 decompressive laminectomy with bilateral L3, L4, and       L5 decompressive foraminotomies, more than it would be required for       simple interbody fusion alone.   2. L3-4 and L4-5 posterior lumbar interbody fusion utilizing tangent       interbody allograft wedge, Telamon interbody PEEK cage, and local       autografting.   3. L3, L4, and L5 posterolateral arthrodesis using segmental pedicle         screw fixation and local autografting.  Right hip replacement - Dr. Alvan Dame 09/13/2009   Review of Systems       wt gain,   Physical Exam  General:  alert, well-developed, and well-nourished.   Lungs:  normal respiratory effort and normal breath sounds.   Heart:  normal rate, regular rhythm, and no gallop.   Extremities:  No lower extremity edema    Impression & Recommendations:  Problem # 1:  LUMBAR RADICULOPATHY, RIGHT (ICD-724.4) Assessment Improved  The following medications were removed from the medication list:    Fentanyl 25 Mcg/hr Pt72 (Fentanyl) .Marland Kitchen... Change q 48-72 hrs as directed Her updated medication list for this problem includes:    Low-dose Aspirin 81 Mg Tabs (Aspirin) ..... One by mouth once daily    Oxycodone-acetaminophen 5-325 Mg Tabs (Oxycodone-acetaminophen) ..... One by mouth two times a day as needed    Tramadol Hcl 50 Mg Tabs (Tramadol hcl) ..... One by mouth once daily prn    Celebrex 200 Mg Caps (Celecoxib) .Marland Kitchen... Take 1 tablet by mouth two  times a day  Problem # 2:  HYPERLIPIDEMIA (ICD-272.4) reduce simvastatin.  question contributing to aches and pains Her updated medication list for this problem includes:    Vytorin 10-10 Mg Tabs (Ezetimibe-simvastatin) ..... One by mouth once daily  Complete Medication List: 1)  Hydrochlorothiazide 25 Mg Tabs (Hydrochlorothiazide) .... One by mouth once daily 2)  Levothyroxine Sodium 100 Mcg Tabs (Levothyroxine sodium) .... Take 1 tablet by mouth once a day 3)  Bisoprolol Fumarate 5 Mg Tabs (Bisoprolol fumarate) .... One by mouth once daily 4)  Vytorin 10-10 Mg Tabs (Ezetimibe-simvastatin) .... One by mouth once daily 5)  Low-dose Aspirin 81 Mg Tabs (Aspirin) .... One by mouth once daily 6)  Prilosec 20 Mg Cpdr (Omeprazole) .... One by mouth two times a day 7)  Vitamin D 1000 Unit Tabs (Cholecalciferol) .... 2 daily 8)  Metamucil Plus Calcium Caps (Psyllium-calcium) .... Take 1 capsule by mouth two times a  day 9)  Oxycodone-acetaminophen 5-325 Mg Tabs (Oxycodone-acetaminophen) .... One by mouth two times a day as needed 10)  Fish Oil 1200 Mg Caps (Omega-3 fatty acids) .... 2 capsules by mouth once daily 11)  Cinnamon 500 Mg Tabs (Cinnamon) .... 2000 mg by mouth once daily 12)  Gabapentin 100 Mg Caps (Gabapentin) .... Take 3 times per day as directed 13)  Tramadol Hcl 50 Mg Tabs (Tramadol hcl) .... One by mouth once daily prn 14)  Celebrex 200 Mg Caps (Celecoxib) .... Take 1 tablet by mouth two times a day  Other Orders: T-TSH ([id number]) T-Basic Metabolic Panel ([id number])  Patient Instructions: 1)  Please schedule a follow-up appointment in 3 months. Prescriptions: VYTORIN 10-10 MG TABS (EZETIMIBE-SIMVASTATIN) one by mouth once daily  #30 x 5   Entered and Authorized by:   D. Drema Pry DO   Signed by:   D. Drema Pry DO on 02/21/2010   Method used:   Electronically to        [facility] (retail)       [address]       [place], [place]  [address]       Ph: [phone]       Fax: [phone]   RxID:   [id number] TRAMADOL HCL 50 MG TABS (TRAMADOL HCL) one by mouth once daily prn  #30 x 3   Entered and Authorized by:   D. Drema Pry DO   Signed by:   D. Drema Pry DO on 02/21/2010   Method used:   Electronically to        [facility] (retail)       [address]       [place], [place]  [address]       Ph: [phone]       Fax: [phone]   RxID:   [id number] GABAPENTIN 100 MG CAPS (GABAPENTIN) take 3 times per day as directed  #90 x 3   Entered and Authorized by:   D. Drema Pry DO   Signed by:   D. Drema Pry DO on 02/21/2010   Method used:   Electronically to        [facility] (retail)       [address]       [place], [place]  [address]       Ph: [phone]  Fax: [phone]   RxID:   [id number] HYDROCHLOROTHIAZIDE 25 MG TABS (HYDROCHLOROTHIAZIDE)  one by mouth once daily  #30 x 5   Entered and Authorized by:   D. Drema Pry DO   Signed by:   D. Drema Pry DO on 02/21/2010   Method used:   Electronically to        [facility] (retail)       [address]       [place], [place]  [address]       Ph: [phone]       Fax: [phone]   RxID:   [id number] BISOPROLOL FUMARATE 5 MG TABS (BISOPROLOL FUMARATE) one by mouth once daily  #30 x 2   Entered and Authorized by:   D. Drema Pry DO   Signed by:   D. Drema Pry DO on 02/21/2010   Method used:   Electronically to        [facility] (retail)       [address]       [place], [place]  [address]       Ph: [phone]       Fax: [phone]   RxID:   [id number]   Current Allergies (reviewed today): ! PCN ! FLAGYL ! CRESTOR LYRICA (PREGABALIN)   Preventive Care Screening  Pap Smear:    Date:  02/21/2010    Results:  Declined

## 2010-09-03 NOTE — Progress Notes (Signed)
Summary: Lab Results  Phone Note Outgoing Call   Summary of Call: call pt - blood test is normal ( electrolytes, kidney function and thyroid ) Initial call taken by: D. Drema Pry DO,  February 22, 2010 8:58 AM  Follow-up for Phone Call        attempted to contact patient at [phone], no answer. A detailed voice message was left informing patient per Dr Shawna Orleans instructions. Patient advised to call if any questiions Follow-up by: Jiles Garter CMA,  February 22, 2010 9:15 AM    Prescriptions: LEVOTHYROXINE SODIUM 100 MCG TABS (LEVOTHYROXINE SODIUM) Take 1 tablet by mouth once a day  #90 x 1   Entered and Authorized by:   D. Drema Pry DO   Signed by:   D. Drema Pry DO on 02/22/2010   Method used:   Electronically to        [facility] (retail)       [address] [place] [address]       [place], [place]  [address]       Ph: [phone]       Fax: [phone]   RxID:   [id number]

## 2010-09-03 NOTE — Progress Notes (Signed)
Summary: refill--Dean Delbridge  Phone Note Refill Request Message from:  Fax from [facility] on February 14, 2010 10:09 AM  Refills Requested: Medication #1:  HYDROCHLOROTHIAZIDE 25 MG TABS one by mouth once daily   Dosage confirmed as above?Dosage Confirmed   Supply Requested: 1 month   Last Refilled: 12/28/2009 Next Appointment Scheduled: none Initial call taken by: Kelle Darting CMA Deborra Medina),  February 14, 2010 2:41 PM    Prescriptions: HYDROCHLOROTHIAZIDE 25 MG TABS (HYDROCHLOROTHIAZIDE) one by mouth once daily  #30 x 0   Entered by:   Kelle Darting CMA ([place])   Authorized by:   D. Drema Pry DO   Signed by:   Kelle Darting CMA ([place]) on 02/14/2010   Method used:   Electronically to        [facility] (retail)       [address]       [place], [place]  [address]       Ph: [phone]       Fax: [phone]   RxID:   [id number]

## 2010-09-03 NOTE — Assessment & Plan Note (Signed)
Summary: 2 week follow up/mhf   Vital Signs:  Patient profile:   65 year old female Height:      64 inches Weight:      181.50 pounds BMI:     31.27 O2 Sat:      94 % on Room air Temp:     98.1 degrees F oral Pulse rate:   64 / minute Pulse rhythm:   regular Resp:     18 per minute BP sitting:   124 / 76  (left arm) Cuff size:   large  Vitals Entered By: Jiles Garter CMA (November 15, 2009 11:28 AM)  O2 Flow:  Room air CC: Rm 3- 2 Week Follow up on medication   Primary Care Provider:  DDrema Pry DO  CC:  Rm 3- 2 Week Follow up on medication.  History of Present Illness: 65 y/o still having right hip pain no significant improvement after starting 25 micrograms fentanyl patch pain is the worst when she gets up in morning.   Allergies: 1)  ! Pcn 2)  ! Flagyl 3)  ! Crestor 4)  Lyrica (Pregabalin)  Past History:  Past Medical History: Sarcoidosis Hyperlipidemia GERD  Hypothyroidism  Hypertension Chronic Low Back Pain -       1. 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   2. 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        Past Surgical History: Carpal tunnel release Thyroidectomy        January 15, 2009  1. L3-4 and L4-5 decompressive laminectomy with bilateral L3, L4, and       L5 decompressive foraminotomies, more than it would be required for       simple interbody fusion alone.   2. L3-4 and L4-5 posterior lumbar interbody fusion utilizing tangent       interbody allograft wedge, Telamon interbody PEEK cage, and local       autografting.   3. L3, L4, and L5 posterolateral arthrodesis using segmental pedicle       screw fixation and local autografting.  Right hip replacement - Dr. Alvan Dame 09/13/2009  Family History: The patient's mother had breast cancer diagnosed at age 40 (59 to 32 years ago); she is deceased. Mother also had elevated cholesterol, heart disease, and high blood pressure. She did have a  myocardial infarction in the past, unknown age. Pt was raised by her grandparents         Social History: Married- 47 years and has 2 children (2 daughters) Alcohol use-no Former Smoker - she quit 10 years ago, started when she was 47 and has had varying level of use of tobacco products from 1/2 pack to 3 packs per day.             Physical Exam  General:  alert, well-developed, and well-nourished.   Lungs:  normal respiratory effort, normal breath sounds, no crackles, and no wheezes.   Heart:  normal rate, regular rhythm, and no gallop.   Extremities:  No lower extremity edema  Neurologic:  cranial nerves II-XII intact and gait normal.     Impression & Recommendations:  Problem # 1:  LUMBAR RADICULOPATHY, RIGHT (ICD-724.4) Assessment Unchanged inadequate pain control.  increase fentanyl dose.  use oxycodone as needed  The following medications were removed from the medication list:    Percocet 5-325 Mg Tabs (Oxycodone-acetaminophen) .Marland Kitchen... 1 tablet by mouth every 4-6 hours as needed Her updated medication list for this problem includes:  Low-dose Aspirin 81 Mg Tabs (Aspirin) ..... One by mouth once daily    Fentanyl 50 Mcg/hr Pt72 (Fentanyl) .Marland Kitchen... Apply new patch q 48 hrs    Oxycodone-acetaminophen 5-325 Mg Tabs (Oxycodone-acetaminophen) ..... One by mouth two times a day as needed  Complete Medication List: 1)  Hydrochlorothiazide 25 Mg Tabs (Hydrochlorothiazide) .... One by mouth once daily 2)  Levothyroxine Sodium 100 Mcg Tabs (Levothyroxine sodium) .... Take 1 tablet by mouth once a day 3)  Propranolol Hcl Cr 120 Mg Xr24h-cap (Propranolol hcl) .... One by mouth once daily 4)  Vytorin 10-20 Mg Tabs (Ezetimibe-simvastatin) .... Take one  tablet by mouth every night 5)  Low-dose Aspirin 81 Mg Tabs (Aspirin) .... One by mouth once daily 6)  Prilosec 20 Mg Cpdr (Omeprazole) .... One by mouth two times a day 7)  Vitamin D 1000 Unit Tabs (Cholecalciferol) .... 2 daily 8)   Metamucil Plus Calcium Caps (Psyllium-calcium) .... Take 1 capsule by mouth two times a day 9)  Fentanyl 50 Mcg/hr Pt72 (Fentanyl) .... Apply new patch q 48 hrs 10)  Oxycodone-acetaminophen 5-325 Mg Tabs (Oxycodone-acetaminophen) .... One by mouth two times a day as needed  Patient Instructions: 1)  Please schedule a follow-up appointment in 1 month. Prescriptions: OXYCODONE-ACETAMINOPHEN 5-325 MG TABS (OXYCODONE-ACETAMINOPHEN) one by mouth two times a day as needed  #30 x 0   Entered and Authorized by:   D. Drema Pry DO   Signed by:   D. Drema Pry DO on 11/15/2009   Method used:   Print then Give to Patient   RxID:   [id number] FENTANYL 50 MCG/HR PT72 (FENTANYL) apply new patch q 48 hrs  #15 x 0   Entered and Authorized by:   D. Drema Pry DO   Signed by:   D. Drema Pry DO on 11/15/2009   Method used:   Print then Give to Patient   RxID:   [id number]   Current Allergies (reviewed today): ! PCN ! FLAGYL ! CRESTOR LYRICA (PREGABALIN)

## 2010-09-13 ENCOUNTER — Ambulatory Visit (INDEPENDENT_AMBULATORY_CARE_PROVIDER_SITE_OTHER): Payer: Medicare Other | Admitting: Internal Medicine

## 2010-09-13 ENCOUNTER — Encounter: Payer: Self-pay | Admitting: Internal Medicine

## 2010-09-13 DIAGNOSIS — E785 Hyperlipidemia, unspecified: Secondary | ICD-10-CM

## 2010-09-13 DIAGNOSIS — M25559 Pain in unspecified hip: Secondary | ICD-10-CM

## 2010-09-13 DIAGNOSIS — I10 Essential (primary) hypertension: Secondary | ICD-10-CM

## 2010-09-18 ENCOUNTER — Encounter (INDEPENDENT_AMBULATORY_CARE_PROVIDER_SITE_OTHER): Payer: Self-pay | Admitting: *Deleted

## 2010-09-18 ENCOUNTER — Other Ambulatory Visit: Payer: Medicare Other

## 2010-09-18 ENCOUNTER — Other Ambulatory Visit: Payer: Self-pay | Admitting: Internal Medicine

## 2010-09-18 DIAGNOSIS — E039 Hypothyroidism, unspecified: Secondary | ICD-10-CM

## 2010-09-18 DIAGNOSIS — I10 Essential (primary) hypertension: Secondary | ICD-10-CM

## 2010-09-18 DIAGNOSIS — E785 Hyperlipidemia, unspecified: Secondary | ICD-10-CM

## 2010-09-18 LAB — BASIC METABOLIC PANEL
Calcium: 9.6 mg/dL (ref 8.4–10.5)
Creatinine, Ser: 0.8 mg/dL (ref 0.4–1.2)
GFR: 77.29 mL/min (ref >60.00)
Sodium: 142 mEq/L (ref 135–145)

## 2010-09-18 LAB — TSH: TSH: 0.51 u[IU]/mL (ref 0.35–5.50)

## 2010-09-18 LAB — LIPID PANEL
HDL: 42 mg/dL (ref >39.00)
LDL Cholesterol: 106 mg/dL — ABNORMAL HIGH (ref 0–99)
Total CHOL/HDL Ratio: 4
VLDL: 28.6 mg/dL (ref 0.0–40.0)

## 2010-09-18 LAB — HEPATIC FUNCTION PANEL
Alkaline Phosphatase: 51 U/L (ref 39–117)
Bilirubin, Direct: 0.2 mg/dL (ref 0.0–0.3)
Total Bilirubin: 0.9 mg/dL (ref 0.3–1.2)

## 2010-09-19 ENCOUNTER — Encounter: Payer: Self-pay | Admitting: Internal Medicine

## 2010-09-25 NOTE — Letter (Signed)
   Portal at [facility] [place], [place]  [address]  [place] Phone: [phone]      September 19, 2010   [place] [address], [place] [address]  RE:  LAB RESULTS  Dear  Ms. Ticas,  The following is an interpretation of your most recent lab tests.  Please take note of any instructions provided or changes to medications that have resulted from your lab work.  ELECTROLYTES:  Good - no changes needed  KIDNEY FUNCTION TESTS:  Good - no changes needed  LIVER FUNCTION TESTS:  Good - no changes needed  LIPID PANEL:  Good - no changes needed Triglyceride: 143.0   Cholesterol: 177   LDL: 106   HDL: 42.00   Chol/HDL%:  4  THYROID STUDIES:  Thyroid studies normal TSH: 0.51           Sincerely Yours,    Dr. Drema Pry  Appended Document:  mailed

## 2010-10-01 NOTE — Assessment & Plan Note (Signed)
Summary: 4 month fu/mhf   Vital Signs:  Patient profile:   65 year old female Height:      64 inches Weight:      185 pounds BMI:     31.87 O2 Sat:      96 % on Room air Temp:     97.4 degrees F oral Pulse rate:   73 / minute Resp:     18 per minute BP sitting:   114 / 72  (right arm) Cuff size:   large  Vitals Entered By: Jiles Garter CMA (September 13, 2010 2:09 PM)  O2 Flow:  Room air CC: 4 Month Follow up  Is Patient Diabetic? No Pain Assessment Patient in pain? no      Comments would like to know of the Celebrex could be replaced with something else for her arthritis   Primary Care Provider:  Jennings Books DO  CC:  4 Month Follow up .  History of Present Illness: 65 yo female for fu back pain is better.  she uses celebrex as needed for hip and back stiffness  htn- stable  hyperlipidemia - stable  Preventive Screening-Counseling & Management  Alcohol-Tobacco     Smoking Status: quit  Allergies: 1)  ! Pcn 2)  ! Flagyl 3)  ! Crestor 4)  Lyrica (Pregabalin)  Past History:  Past Medical History: Sarcoidosis Hyperlipidemia  GERD   Hypothyroidism      Hypertension Chronic Low Back Pain -        1. 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   2. 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        Past Surgical History: Carpal tunnel release Thyroidectomy           January 15, 2009  1. L3-4 and L4-5 decompressive laminectomy with bilateral L3, L4, and       L5 decompressive foraminotomies, more than it would be required for       simple interbody fusion alone.   2. L3-4 and L4-5 posterior lumbar interbody fusion utilizing tangent       interbody allograft wedge, Telamon interbody PEEK cage, and local       autografting.   3. L3, L4, and L5 posterolateral arthrodesis using segmental pedicle       screw fixation and local autografting.  Right hip replacement - Dr. Alvan Dame 09/13/2009   Family History: The  patient's mother had breast cancer diagnosed at age 38 (71 to 76 years ago); she is deceased. Mother also had elevated cholesterol, heart disease, and high blood pressure. She did have a myocardial infarction in the past, unknown age. Pt was raised by her grandparents           Social History: Married- 52 years and has 2 children (2 daughters) Alcohol use-no Former Smoker - she quit 10 years ago, started when she was 86 and has had varying level of use of tobacco products from 1/2 pack to 3 packs per day.               Physical Exam  General:  alert, well-developed, and well-nourished.   Lungs:  normal respiratory effort and normal breath sounds.   Heart:  normal rate, regular rhythm, and no gallop.   Extremities:  No lower extremity edema    Impression & Recommendations:  Problem # 1:  HIP PAIN, RIGHT, CHRONIC (ICD-719.45) pt still has periodic stiffness.  use celebrex sparingly  The following medications were removed from  the medication list:    Oxycodone-acetaminophen 5-325 Mg Tabs (Oxycodone-acetaminophen) ..... One by mouth two times a day as needed    Celebrex 200 Mg Caps (Celecoxib) .Marland Kitchen... Take 1 tablet by mouth two times a day Her updated medication list for this problem includes:    Low-dose Aspirin 81 Mg Tabs (Aspirin) ..... One by mouth once daily    Tramadol Hcl 50 Mg Tabs (Tramadol hcl) ..... One by mouth once daily prn    Celebrex 200 Mg Caps (Celecoxib) ..... One by mouth once daily prn  Problem # 2:  HYPERTENSION, CONTROLLED (ICD-401.1) Assessment: Deteriorated  Her updated medication list for this problem includes:    Hydrochlorothiazide 25 Mg Tabs (Hydrochlorothiazide) ..... One by mouth once daily    Bisoprolol Fumarate 5 Mg Tabs (Bisoprolol fumarate) ..... One by mouth once daily  BP today: 114/72 Prior BP: 140/78 (05/20/2010)  Labs Reviewed: K+: 4.5 (02/21/2010) Creat: : 0.97 (02/21/2010)   Chol: 238 (03/26/2009)   HDL: 52 (03/26/2009)   LDL: 139  (03/26/2009)   TG: 233 (03/26/2009)  Problem # 3:  HYPERLIPIDEMIA (ICD-272.4) Assessment: Unchanged  Her updated medication list for this problem includes:    Vytorin 10-10 Mg Tabs (Ezetimibe-simvastatin) ..... One by mouth once daily  Labs Reviewed: SGOT: 17 (03/26/2009)   SGPT: 17 (03/26/2009)   HDL:52 (03/26/2009), 37.1 (01/18/2008)  LDL:139 (03/26/2009), 104 (01/18/2008)  Chol:238 (03/26/2009), 170 (01/18/2008)  Trig:233 (03/26/2009), 143 (01/18/2008)  Complete Medication List: 1)  Hydrochlorothiazide 25 Mg Tabs (Hydrochlorothiazide) .... One by mouth once daily 2)  Levothyroxine Sodium 100 Mcg Tabs (Levothyroxine sodium) .... Take 1 tablet by mouth once a day 3)  Bisoprolol Fumarate 5 Mg Tabs (Bisoprolol fumarate) .... One by mouth once daily 4)  Vytorin 10-10 Mg Tabs (Ezetimibe-simvastatin) .... One by mouth once daily 5)  Low-dose Aspirin 81 Mg Tabs (Aspirin) .... One by mouth once daily 6)  Prilosec 20 Mg Cpdr (Omeprazole) .... One by mouth two times a day 7)  Vitamin D 1000 Unit Tabs (Cholecalciferol) .... 2 tablets by mouth at bedtime 8)  Fish Oil 1200 Mg Caps (Omega-3 fatty acids) .... 2 capsules by mouth once daily 9)  Cinnamon 500 Mg Tabs (Cinnamon) .... 2000 mg by mouth once daily 10)  Gabapentin 100 Mg Caps (Gabapentin) .... Take 3 times per day as directed 11)  Tramadol Hcl 50 Mg Tabs (Tramadol hcl) .... One by mouth once daily prn 12)  Triamcinolone Acetonide 0.1 % Crea (Triamcinolone acetonide) .... Apply two times a day 13)  Fluticasone Propionate 50 Mcg/act Susp (Fluticasone propionate) .... 2 sprays each nostril qd 14)  Celebrex 200 Mg Caps (Celecoxib) .... One by mouth once daily prn  Patient Instructions: 1)  Please schedule a follow-up appointment in 4 months. Prescriptions: VYTORIN 10-10 MG TABS (EZETIMIBE-SIMVASTATIN) one by mouth once daily  #90 x 1   Entered and Authorized by:   D. Drema Pry DO   Signed by:   D. Drema Pry DO on 09/13/2010   Method used:    Electronically to        [facility] (retail)       [address]       [place], [place]  [address]       Ph: [phone]       Fax: [phone]   RxID:   [id number] BISOPROLOL FUMARATE 5 MG TABS (BISOPROLOL FUMARATE) one by mouth once daily  #90 x 1  Entered and Authorized by:   D. Drema Pry DO   Signed by:   D. Drema Pry DO on 09/13/2010   Method used:   Electronically to        [facility] (retail)       [address]       [place], [place]  [address]       Ph: [phone]       Fax: [phone]   RxID:   [id number] LEVOTHYROXINE SODIUM 100 MCG TABS (LEVOTHYROXINE SODIUM) Take 1 tablet by mouth once a day  #90 x 1   Entered and Authorized by:   D. Drema Pry DO   Signed by:   D. Drema Pry DO on 09/13/2010   Method used:   Electronically to        [facility] (retail)       [address]       [place], [place]  [address]       Ph: [phone]       Fax: [phone]   RxID:   [id number] HYDROCHLOROTHIAZIDE 25 MG TABS (HYDROCHLOROTHIAZIDE) one by mouth once daily  #90 x 1   Entered and Authorized by:   D. Drema Pry DO   Signed by:   D. Drema Pry DO on 09/13/2010   Method used:   Electronically to        [facility] (retail)       [address]       [place], [place]  [address]       Ph: [phone]       Fax: [phone]   RxID:   [id number] CELEBREX 200 MG CAPS (CELECOXIB) one by mouth once daily prn  #30 x 3   Entered and Authorized by:   D. Drema Pry DO   Signed by:   D. Drema Pry DO on 09/13/2010   Method used:   Electronically to        [facility] (retail)       [address]       [place], [place]  [address]       Ph: [phone]       Fax: [phone]   RxID:   [id number]    Orders Added: 1)  Est. Patient Level III [[address]]       Current Allergies (reviewed  today): ! PCN ! FLAGYL ! CRESTOR LYRICA (PREGABALIN)

## 2010-10-08 ENCOUNTER — Emergency Department (HOSPITAL_BASED_OUTPATIENT_CLINIC_OR_DEPARTMENT_OTHER)
Admission: EM | Admit: 2010-10-08 | Discharge: 2010-10-08 | Disposition: A | Discharge status: Home / Self Care | Payer: Medicare Other | Attending: Emergency Medicine | Admitting: Emergency Medicine

## 2010-10-08 DIAGNOSIS — I10 Essential (primary) hypertension: Secondary | ICD-10-CM | POA: Insufficient documentation

## 2010-10-08 DIAGNOSIS — I2510 Atherosclerotic heart disease of native coronary artery without angina pectoris: Secondary | ICD-10-CM | POA: Insufficient documentation

## 2010-10-08 DIAGNOSIS — R112 Nausea with vomiting, unspecified: Secondary | ICD-10-CM | POA: Insufficient documentation

## 2010-10-08 DIAGNOSIS — G8929 Other chronic pain: Secondary | ICD-10-CM | POA: Insufficient documentation

## 2010-10-08 DIAGNOSIS — R197 Diarrhea, unspecified: Secondary | ICD-10-CM | POA: Insufficient documentation

## 2010-10-08 DIAGNOSIS — Z79899 Other long term (current) drug therapy: Secondary | ICD-10-CM | POA: Insufficient documentation

## 2010-10-08 DIAGNOSIS — E785 Hyperlipidemia, unspecified: Secondary | ICD-10-CM | POA: Insufficient documentation

## 2010-10-08 LAB — CBC
HCT: 48.9 % — ABNORMAL HIGH (ref 36.0–46.0)
MCHC: 34.8 g/dL (ref 30.0–36.0)
RBC: 5.7 MIL/uL — ABNORMAL HIGH (ref 3.87–5.11)
RDW: 13.4 % (ref 11.5–15.5)
WBC: 15.3 10*3/uL — ABNORMAL HIGH (ref 4.0–10.5)

## 2010-10-08 LAB — DIFFERENTIAL
Eosinophils Absolute: 0.1 10*3/uL (ref 0.0–0.7)
Eosinophils Relative: 0 % (ref 0–5)
Lymphocytes Relative: 4 % — ABNORMAL LOW (ref 12–46)
Lymphs Abs: 0.6 10*3/uL — ABNORMAL LOW (ref 0.7–4.0)
Monocytes Relative: 3 % (ref 3–12)
Neutrophils Relative %: 93 % — ABNORMAL HIGH (ref 43–77)

## 2010-10-08 LAB — URINE MICROSCOPIC-ADD ON

## 2010-10-08 LAB — COMPREHENSIVE METABOLIC PANEL
ALT: 48 U/L — ABNORMAL HIGH (ref 0–35)
AST: 43 U/L — ABNORMAL HIGH (ref 0–37)
CO2: 25 mEq/L (ref 19–32)
Calcium: 10.2 mg/dL (ref 8.4–10.5)
GFR calc Af Amer: >60 mL/min (ref >60)
Sodium: 146 mEq/L — ABNORMAL HIGH (ref 135–145)
Total Protein: 8.9 g/dL — ABNORMAL HIGH (ref 6.0–8.3)

## 2010-10-08 LAB — URINALYSIS, ROUTINE W REFLEX MICROSCOPIC
Bilirubin Urine: NEGATIVE
Glucose, UA: NEGATIVE mg/dL
Ketones, ur: NEGATIVE mg/dL
pH: 6 (ref 5.0–8.0)

## 2010-10-23 LAB — BASIC METABOLIC PANEL
BUN: 8 mg/dL (ref 6–23)
CO2: 33 mEq/L — ABNORMAL HIGH (ref 19–32)
Calcium: 8.1 mg/dL — ABNORMAL LOW (ref 8.4–10.5)
Chloride: 102 mEq/L (ref 96–112)
Chloride: 103 mEq/L (ref 96–112)
Chloride: 105 mEq/L (ref 96–112)
GFR calc Af Amer: >60 mL/min (ref >60)
GFR calc Af Amer: >60 mL/min (ref >60)
GFR calc non Af Amer: 56 mL/min — ABNORMAL LOW (ref >60)
GFR calc non Af Amer: >60 mL/min (ref >60)
GFR calc non Af Amer: >60 mL/min (ref >60)
Glucose, Bld: 117 mg/dL — ABNORMAL HIGH (ref 70–99)
Glucose, Bld: 131 mg/dL — ABNORMAL HIGH (ref 70–99)
Glucose, Bld: 99 mg/dL (ref 70–99)
Potassium: 3 mEq/L — ABNORMAL LOW (ref 3.5–5.1)
Potassium: 4 mEq/L (ref 3.5–5.1)
Potassium: 4.1 mEq/L (ref 3.5–5.1)
Potassium: 4.4 mEq/L (ref 3.5–5.1)
Sodium: 137 mEq/L (ref 135–145)
Sodium: 138 mEq/L (ref 135–145)

## 2010-10-23 LAB — URINALYSIS, ROUTINE W REFLEX MICROSCOPIC
Bilirubin Urine: NEGATIVE
Hgb urine dipstick: NEGATIVE
Ketones, ur: NEGATIVE mg/dL
Nitrite: NEGATIVE
Specific Gravity, Urine: 1.013 (ref 1.005–1.030)
Urobilinogen, UA: 0.2 mg/dL (ref 0.0–1.0)

## 2010-10-23 LAB — CBC
HCT: 30.6 % — ABNORMAL LOW (ref 36.0–46.0)
HCT: 42.1 % (ref 36.0–46.0)
Hemoglobin: 10.7 g/dL — ABNORMAL LOW (ref 12.0–15.0)
Hemoglobin: 11.5 g/dL — ABNORMAL LOW (ref 12.0–15.0)
MCHC: 34.2 g/dL (ref 30.0–36.0)
MCV: 91.9 fL (ref 78.0–100.0)
MCV: 92 fL (ref 78.0–100.0)
RBC: 3.58 MIL/uL — ABNORMAL LOW (ref 3.87–5.11)
RBC: 4.58 MIL/uL (ref 3.87–5.11)
RDW: 13.1 % (ref 11.5–15.5)
RDW: 13.3 % (ref 11.5–15.5)
WBC: 8.6 10*3/uL (ref 4.0–10.5)
WBC: 9.9 10*3/uL (ref 4.0–10.5)

## 2010-10-23 LAB — DIFFERENTIAL
Basophils Relative: 1 % (ref 0–1)
Eosinophils Absolute: 0.2 10*3/uL (ref 0.0–0.7)
Eosinophils Relative: 3 % (ref 0–5)
Lymphs Abs: 2 10*3/uL (ref 0.7–4.0)
Monocytes Absolute: 0.8 10*3/uL (ref 0.1–1.0)
Monocytes Relative: 9 % (ref 3–12)

## 2010-10-23 LAB — ABO/RH: ABO/RH(D): O POS

## 2010-10-23 LAB — TYPE AND SCREEN: Antibody Screen: NEGATIVE

## 2010-10-23 LAB — URINE MICROSCOPIC-ADD ON

## 2010-11-11 LAB — TYPE AND SCREEN
ABO/RH(D): O POS
Antibody Screen: NEGATIVE

## 2010-11-11 LAB — BASIC METABOLIC PANEL
BUN: 14 mg/dL (ref 6–23)
CO2: 30 mEq/L (ref 19–32)
Chloride: 100 mEq/L (ref 96–112)
Creatinine, Ser: 0.9 mg/dL (ref 0.4–1.2)

## 2010-11-11 LAB — DIFFERENTIAL
Basophils Absolute: 0 K/uL (ref 0.0–0.1)
Basophils Relative: 1 % (ref 0–1)
Eosinophils Absolute: 0.3 K/uL (ref 0.0–0.7)
Eosinophils Relative: 3 % (ref 0–5)
Lymphocytes Relative: 21 % (ref 12–46)
Lymphs Abs: 2.1 K/uL (ref 0.7–4.0)
Monocytes Absolute: 0.8 K/uL (ref 0.1–1.0)
Monocytes Relative: 8 % (ref 3–12)
Neutro Abs: 6.6 K/uL (ref 1.7–7.7)
Neutrophils Relative %: 67 % (ref 43–77)

## 2010-11-11 LAB — CBC
HCT: 29.1 % — ABNORMAL LOW (ref 36.0–46.0)
HCT: 42.4 % (ref 36.0–46.0)
Hemoglobin: 14.8 g/dL (ref 12.0–15.0)
MCHC: 34.8 g/dL (ref 30.0–36.0)
MCV: 89.6 fL (ref 78.0–100.0)
MCV: 90.7 fL (ref 78.0–100.0)
Platelets: 160 10*3/uL (ref 150–400)
Platelets: 238 K/uL (ref 150–400)
RBC: 4.67 MIL/uL (ref 3.87–5.11)
RDW: 12.8 % (ref 11.5–15.5)
RDW: 13 % (ref 11.5–15.5)
WBC: 12.4 10*3/uL — ABNORMAL HIGH (ref 4.0–10.5)
WBC: 9.9 K/uL (ref 4.0–10.5)

## 2010-11-15 ENCOUNTER — Telehealth (INDEPENDENT_AMBULATORY_CARE_PROVIDER_SITE_OTHER): Payer: Medicare Other | Admitting: Internal Medicine

## 2010-11-15 DIAGNOSIS — IMO0002 Reserved for concepts with insufficient information to code with codable children: Secondary | ICD-10-CM

## 2010-11-15 MED ORDER — GABAPENTIN 100 MG PO CAPS: 100.0000 mg | ORAL_CAPSULE | Freq: Three times a day (TID) | ORAL | Status: DC

## 2010-11-15 NOTE — Telephone Encounter (Signed)
Refill- gabapentin 124m cap. Take three capsules by mouth every day as directed. Qty 90. Last fill 3.1.12

## 2010-11-15 NOTE — Telephone Encounter (Signed)
Rx refill for Gabapentin sent to pharmacy

## 2010-12-17 NOTE — Op Note (Signed)
Kimberly, Irwin                 ACCOUNT NO.:  [id number]   MEDICAL RECORD NO.:  [id number]          PATIENT TYPE:  INP   LOCATION:  3016                         FACILITY:  [facility]   PHYSICIAN:  Cooper Render. Pool, M.D.    DATE OF BIRTH:  [birth year]   DATE OF PROCEDURE:  01/15/2009  DATE OF DISCHARGE:                               OPERATIVE REPORT   PREOPERATIVE DIAGNOSES:  1. 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  2. 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   POSTOPERATIVE DIAGNOSES:  1. 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  2. 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   PROCEDURE NAME:  1. L3-4 and L4-5 decompressive laminectomy with bilateral L3, L4, and      L5 decompressive foraminotomies, more than it would be required for      simple interbody fusion alone.  2. L3-4 and L4-5 posterior lumbar interbody fusion utilizing tangent      interbody allograft wedge, Telamon interbody PEEK cage, and local      autografting.  3. L3, L4, and L5 posterolateral arthrodesis using segmental pedicle      screw fixation and local autografting.   SURGEON:  Cooper Render. Pool, MD   ASSISTANT:  Faythe Ghee, MD   ANESTHESIA:  General endotracheal.   INDICATIONS:  MS. Kimberly Irwin is a 65 year old female with history of back and  bilateral lower extremity pain failing conservative management.  Workup  demonstrates evidence of stenosis and disk degeneration at L3-4 and  severe stenosis with a grade I L4-5 degenerative spondylolisthesis at L4-  5.  The patient has failed conservative management and presents now for  2-level decompression and fusion with instrumentation in the hopes of  improving her symptoms.   OPERATIVE NOTE:  The patient was taken to the operating room and placed  on the operating table in the supine position.  After adequate level of  anesthesia was achieved, the patient was positioned  prone onto a Wilson  frame and appropriately padded.  The patient's lumbar region was prepped  and draped sterilely.  A 10 blade was used to make a curvilinear skin  incision overlying the L3, 4, and 5 levels.  This was carried down  sharply in the midline.  A subperiosteal dissection was performed  exposing the lamina and facet joints of L3, L4, and L5 as well as the  transverse processes of the aforementioned levels.  Deep self-retaining  retractor was placed.  Intraoperative fluoroscopy was used and levels  were confirmed.  Decompressed laminectomy was then performed using  Leksell rongeurs, Kerrison rongeurs, and high-speed drill to remove the  entire lamina of L3, the entire lamina of L4, and the superior aspect  lamina of L5.  Inferior facetectomies of L4 and L5 were performed  bilaterally.  Superior facetectomies of L4 and L5 were performed  bilaterally.  Ligament flavum was then elevated and resected in the  piecemeal fashion using Kerrison rongeurs.  Wide decompressive  foraminotomies were then performed along the course of exiting L3, L4,  and L5 nerve  roots bilaterally.  Epidural venous plexus was coagulated  and cut.  Bilateral diskectomies were then performed at L3-4 and L4-5.  Starting first at L3-4, disk space was sequentially dilated up to 8 mm  with an 8-mm distractor left in place.  Starting on the patient's left  side, thecal sac and nerve roots were protected.  Disk space was then  reamed and then cut with an 8-mm tangent instrument.  Soft tissues were  then removed from the interspace.  An 8 x 22 mm tangent wedge was then  packed into place and recessed approximately 1 mm from posterior  cortical margin of L3.  Distractors were removed from the patient's left  side.  Thecal sac and nerve roots were protected on the left side.  Disk  space was once again reamed and then cut with an 8-mm tangent  instrument.  The soft tissues were removed from the interspace.   Morselized autograft was then packed into interspace for later fusion.  An x 22 mm Telamon cage was packed with morselized autograft and then  packed into place and recessed approximately 2 mm from posterior  cortical margin of L3.  Procedure was then repeated in a similar fashion  at L4-5 again without complication again using an 8 x 22 mm implants.  Pedicles of L3, L4, and L5 were then identified using surface landmarks  and intraoperative fluoroscopy.  Superficial bone around the pedicle was  then removed using high-speed drill.  Each pedicle was then probed using  pedicle awl.  Each pedicle awl track was then tapped with 5.25 mm screw  tapper.  Each screw tap hole was then probed and found to be solid with  the bone.  A 5.75 x 40 mm radius screw was placed bilaterally at L3-4,  L4, and L5.  Transverse processes of L3, L4, and L5 were then  decorticated using high-speed drill.  Morselized autograft was packed  posterolaterally for later infusion.  Short segment titanium rods were  placed over the screw heads from L3-L5.  Locking caps were then placed  over the screw heads and locking caps were then engaged with construct  under compression.  Transverse connector was placed.  Medium Hemovac  drain was left in the interspace.  Final x-rays revealed good position  of bone grafts, hardware at proper operative level, and normal alignment  of spine.  Wound was then closed in typical fashion.  Steri-Strips and  sterile dressing were applied.  There were no complications.  The  patient tolerated the procedure well and she returned to the recovery  room postoperatively.           ______________________________  Cooper Render Pool, M.D.     HAP/MEDQ  D:  01/15/2009  T:  01/16/2009  Job:  [id number]

## 2010-12-17 NOTE — Procedures (Signed)
NAMEVAIDEHI, Kimberly Irwin                 ACCOUNT NO.:  [id number]   MEDICAL RECORD NO.:  [id number]           PATIENT TYPE:   LOCATION:                                 FACILITY:   PHYSICIAN:  Charlett Blake, M.D.DATE OF BIRTH:  11/25/1945   DATE OF PROCEDURE:  08/28/2008  DATE OF DISCHARGE:                               OPERATIVE REPORT   PROCEDURE:  Right L5 radiofrequency neurotomy, right L4 medial branch  radiofrequency neurotomy, right L3 medial branch radiofrequency  neurotomy under fluoroscopic guidance of both sacral and lumbar areas.   INDICATIONS:  Lumbar axial pain with greater than 50% relief on 2  subsequent medial branch blocks of corresponding levels.  The last one  done on August 08, 2008.   Pain is only partially responsive to narcotic analgesic medications and  interferes with self-care and mobility.   Informed consent was obtained after describing risks and benefits of the  procedure with the patient.  These include bleeding, bruising,  infection, temporary or permanent paralysis.  She elects to proceed and  has given written consent.  The patient placed prone on fluoroscopy  table.  Betadine prep, sterile drape.  A 25-gauge inch and a half needle  was used to anesthetize the skin and subcu tissue with 1% lidocaine x2  mL at each of 3 sites.  Then, a 20-gauge 10-cm RF needle with 10-mm  curved active tip was inserted under fluoroscopic guidance first  targeting at the right S1 SAP-sacral ala junction, bone contact was  made, confirmed with lateral imaging.  Sensory stem at 50 Hz followed by  motor stem at 2 Hz confirmed proper needle location followed by  injection of 1 mL of solution containing 1 mL of 4 mg/mL dexamethasone  and 2 mL of 1% MPF lidocaine followed by radiofrequency lesioning at 70  degree Celsius for 70 seconds.  Then, the right L5 SAP transverse  process junction targeted, bone contact made, confirmed with lateral  imaging.  Sensory stem at 50 Hz  followed by motor stem at 2 Hz confirmed  proper needle location followed by injection of 1 mL of the  dexamethasone lidocaine solution and radiofrequency lesioning at 70  degree Celsius for 70 seconds.  Then, the right L4 SAP transverse  process junction targeted, bone contact made, confirmed with lateral  imaging.  Sensory stem at 50 Hz followed by motor stem at 2 Hz confirmed  proper needle location followed by injection of 1 mL of the  dexamethasone lidocaine solution and radiofrequency lesioning at 70  degree Celsius for 70 seconds.  The patient tolerated the procedure  well.  Pre and postinjection vitals stable.  Postinjection instructions  given.  I will see her back in approximately 2 weeks at the [facility] office for followup.      Charlett Blake, M.D.  Electronically Signed    AEK/MEDQ  D:  08/28/2008 10:54:36  T:  08/28/2008 23:58:39  Job:  [id number]   cc:   Sandy Salaam. [place], [place]  [place], [place] [address]

## 2010-12-17 NOTE — Assessment & Plan Note (Signed)
Kimberly Irwin returns today.  I last saw her September 06, 2008.  She has had a  right L5 radiofrequency neurotomy, right L4 medial branch radiofrequency  neurotomy as well as right L3 medial branch under fluoroscopic guidance  August 28, 2008.  She had some post injection soreness with this  procedure.  However, she states that the pain in the low part of back  has improved overall and she has more or less knee pain in the anterior  thigh.  She has been taking Neurontin 100 mg at night for pain, however,  does not tolerate 300 mg dose.   Current pain meds Darvocet N 100.  She was increased to q.i.d., but this  really did not help any more than the t.i.d.  She continues on fentanyl  patch 25 mcg q.48 h.   Her average pain is 6/10, increase in her activity 6-7.  Her sleep is  good.  Relief from meds is fair.  Her other review of systems negative.  She has no weakness in the lower extremities.  She does have some  numbness at times in the thighs as well.   PHYSICAL EXAMINATION:  VITAL SIGNS:  Her blood pressure 144/70, pulse  60, respirations 18, O2 sat 93% on room air.  GENERAL:  No acute distress.  Orientation x3.  Affect alert.  Gait is  normal.   She has had negative femoral stretch test.  Negative straight leg  raising test.  Her deep tendon reflexes are normal.  Bilateral lower  extremity strength is normal.  Her hip range of motion is full in  external rotation, mild reduced internal rotation.   RADIOLOGIC STUDIES:  I have reviewed PA pelvis performed September 06, 2008 that showed mild degenerative changes with small osteophytic spur  on the superior acetabulum bilaterally as well as subchondral sclerosis  and mild joint space narrowing.  I do not think that this accounts for  her pain, however, and I suspect that she may have some increasing  problems with lumbar spinal stenosis.  Her last MRI of her lumbar spine  was reviewed in 2007 and showed stenosis which is worse at L4-5  level.  I am suspecting that this has progressed and needs further evaluation.   PLAN:  1. We will go ahead and order MRI of lumbar spine without contrast.  2. Reduced Darvocet to t.i.d. dosing.  3. Continue fentanyl patch 25 mcg q.48 h.  4. This patient has been taking some ibuprofen, we would see that she      is only taking 400 b.i.d.  We will give her a trial of Zipsor which      is diclofenac 25 mg t.i.d. with food.   We will see her back to review the MRI study and then decide whether or  not she may benefit from an epidural steroid injection.      Charlett Blake, M.D.  Electronically Signed     AEK/MedQ  D:  09/21/2008 15:28:14  T:  09/22/2008 03:53:59  Job #:  [id number]   cc:   Sandy Salaam. [place], [place]  [place], [place] [address]

## 2010-12-17 NOTE — Procedures (Signed)
Kimberly Irwin, Kimberly Irwin                 ACCOUNT NO.:  [id number]   MEDICAL RECORD NO.:  [id number]           PATIENT TYPE:   LOCATION:                                 FACILITY:   PHYSICIAN:  Charlett Blake, M.D.DATE OF BIRTH:  1946-04-06   DATE OF PROCEDURE:  DATE OF DISCHARGE:                               OPERATIVE REPORT   A 65 year old female with a history of back pain going to 2007.  She has  had an MRI of the spine showing degenerative changes most pronounced at  the L4-5 with moderate stenosis at that level.  She has both constant  aching pain in her back mainly on the right side, but also has some  electric-type sensation that interferes with sleep at night which goes  beyond the knee to the leg.   I saw her in initial consultation on June 22, 2008.  She has had an  ED visit for some new-onset rib pain which is slightly newer pain  occurring after picking something up.  She had ED evaluation and was  treated with oxycodone #15, 7.5 mg, as well as Valium 5 mg.  She has  taken one of each of these yesterday.   She has had no new problems other than that.  They wondered whether this  could be shingles.  She is here for further evaluation.  She has had a  previously scheduled medial branch block today as well.   Her review of systems is positive constipation and abdominal pain.   PHYSICAL EXAMINATION:  GENERAL:  Overweight female in no acute distress.  Her back has tenderness to palpation of lumbar paraspinals.  There is no  evidence of a rash.  Gait is normal.  Mood and affect are appropriate.  Her pain level is 5/10 right now.  Pain does interfere with sleep.  VITAL SIGNS:  Blood pressure 134/78, pulse 65, respirations 18, O2 93%  on room air.   IMPRESSION:  Low back pain, ribcage pain appears to have been improving,  does not appear to have shingles with monitor.  I think we can proceed  with the injection today.   We also discussed medication management.  Her  urine drug screen was  reviewed today and basically showed medications that were expected, i.e.  fentanyl patch.  We have talked about what she took in the past and how  she did okay on fairly low dose of Darvocet.  We will see if we can  reduce her fentanyl patch to 25 mcg and give her 2 Darvocet for  breakthrough pain.  We will have her change her patch q.48 h. because  she states that it tends to wear out on the third day.   I did instruct her that since I am starting to prescribe narcotic  analgesics for her.  Based on her controlled substance agreement, she is  not to get any for many other physician.  She verbalizes understanding  on this.     Charlett Blake, M.D.  Electronically Signed    AEK/MEDQ  D:  07/10/2008 11:38:29  T:  07/11/2008 01:42:14  Job:  [id number]

## 2010-12-17 NOTE — Assessment & Plan Note (Signed)
Kimberly Irwin returns today.  I have last saw her on August 28, 2008, for  right L5 radiofrequency neurotomy, right L4 medial branch radiofrequency  neurotomy, right L3 medial branch radiofrequency neurotomy under  fluoroscopic guidance.  She had good relief with 2 sets of lumbar medial  branch blocks at corresponding levels.  Since the radiofrequency  procedure, she has had increase in axial back pain.  She has some pain  that goes into the hips as well.  She has had no lower extremity  numbness or weakness.  Her average pain is 6/10, increases with activity  at 6/10.  She is still able do her housework.  Pain is worse with  bending and sitting and standing, improves with rest and heat.   Her review of systems is positive for depression..   Her Oswestry disability index score today is 50%.   MEDICATIONS:  We called in some Flexeril 10 p.o. t.i.d., has not noted  much benefit from this thus far.  She continues on fentanyl patch 25 mcg  q.48 h. as well as increased dose of Darvocet-N 100 to a t.i.d. dosing.  She continues on Neurontin 300 nightly.   Her examination is as follows:  Blood pressure is 136/73, weight 178  pounds, pulse is 58.   Overweight female in no acute distress.  Orientation x3.  Lower  extremities without edema.  Gait is normal.  Deep tendon reflexes are  normal in lower extremities.  Motor strength is normal in bilateral  lower extremities.  Hip range of motion reduced internal and external  rotation accompanied by some pain with hip range of motion in the groin  area.  Her back has no evidence of any lesions, no skin discolorations.  No tenderness to palpation in the lumbar paraspinals.  Her range of  motion in the lumbar spine is 50% on forward flexion, extension, lateral  rotation and bending.  Hip range of motion is 50% in internal and  external rotation.  Otherwise, normal in flexion and extension.  Knee  and ankle range of motion are normal.   IMPRESSION:   Lumbar spondylosis without myelopathy.  She has some post  injection soreness with the radiofrequency procedure is fairly common,  and I reiterated that this may last for another 2-3 weeks.   In the interval time, we will go ahead and up her Darvocet to q.i.d. at  least for this next couple of weeks.  We will continue her fentanyl  patch as is and I have asked her to take ibuprofen 400 mg b.i.d. keep in  mind that she has some history of reflux.  She takes some Prilosec, but  she does not think it is that helpful.   We will reevaluate her in 2 weeks.  We will also check a PA pelvis given  her hip and groin pain and need to further evaluate.  She has reduced  range of motion in that joint as well.      Charlett Blake, M.D.  Electronically Signed     AEK/MedQ  D:  09/06/2008 10:16:24  T:  09/06/2008 22:59:42  Job #:  [id number]   cc:   Sandy Salaam. [place], [address]  [place], [place] [address]

## 2010-12-17 NOTE — Procedures (Signed)
Kimberly Irwin, Kimberly Irwin                 ACCOUNT NO.:  [id number]   MEDICAL RECORD NO.:  [id number]          PATIENT TYPE:  EMS   LOCATION:  ED                            FACILITY:  [facility]   PHYSICIAN:  Charlett Blake, M.D.DATE OF BIRTH:  [birth year]   DATE OF PROCEDURE:  07/10/2008  DATE OF DISCHARGE:  07/09/2008                               OPERATIVE REPORT   PROCEDURE:  Right L4 dorsal ramus injection, right L4 medial branch  block, and right L3 medial branch block under fluoroscopic guidance.   INDICATIONS:  Lumbar axial pain.  Pain is only partially responsive to  medication management including narcotic, analgesics, and interferes  with household duties.   Informed consent was obtained after describing risks and benefits of the  procedure with the patient.  These include bleeding, bruising, and  infection.  She elects to proceed and has given written consent.  The  patient was placed prone on fluoroscopy table with Betadine prep,  sterilely draped, a 25-gauge, 1-1/2-inch needle was used to anesthetize  the skin and subcutaneous tissue.  A 1% lidocaine x2 mL and a 22-gauge 3-  1/2-inch spinal needle was inserted under fluoroscopic guidance, first  starting at the right S1 SAP sacroiliac junction.  Bone contact made and  confirmed with lateral imaging.  Omnipaque 180 x 0.5 mL demonstrated no  intravascular uptake, then 0.5 mL of a solution containing 1 mL of 4 mL  dexamethasone and 2 mL of 0.25% Sensorcaine.  Then, the right L5 SAP  transverse process junction targeted, bone contact made, and confirmed  with lateral imaging.  Omnipaque 180 x 0.5 mL demonstrated no  intravascular uptake, then 0.5 mL of dexamethasone lidocaine solution  was injected.  Then, the right L4 SAP transverse process junction  targeted, bone contact made, and confirmed with lateral imaging.  Omnipaque 180 x 0.5 mL demonstrated no intravascular uptake, then 0.5 mL  of dexamethasone lidocaine solution was  injected.  The patient tolerated  the procedure well.  Pre- and post-injections and vitals stable.  Post-  injection instructions given.  Preinjection pain level 4-5/10,  postinjection pain level 0/10, and we will repeat in 1 month.      Charlett Blake, M.D.  Electronically Signed     AEK/MEDQ  D:  07/10/2008 11:26:59  T:  07/11/2008 01:44:14  Job:  [id number]

## 2010-12-17 NOTE — Assessment & Plan Note (Signed)
Kimberly Irwin is a 65 year old female with lumbar spinal stenosis with  radicular discomfort as her main complaint in the right lower extremity.  She has failed conservative care including physical therapy and spinal  injections.  She is scheduled for surgery this coming Monday.   CURRENT MEDICATIONS:  1. Fentanyl patch 25 mcg q.72 h.  2. Darvocet-N 100 p.o. t.i.d. or q.i.d.   She has had no new medical problems since I last saw her in March.   PHYSICAL EXAMINATION:  VITAL SIGNS:  Blood pressure 129/79, pulse 60,  respirations 18 and O2 sat 96% on room air.  GENERAL:  No acute stress.  Mood and affect is appropriate.  BACK:  No tenderness to palpation.  EXTREMITIES:  Her gait is stenotic appearing, bend forward at the waist.  She has normal strength in lower extremities, decreased deep tendon  reflexes of knees and ankles are rated as 1+.  She has full range of  motion in the knees and ankles, mildly decreased internal rotation at  the hip.   Her Oswestry index score today is 66%, which was similar to last visit.   Her pain score is 8-9.   OTHER MEDICATIONS TRIED:  She stopped gabapentin, which she was taking  fairly small dose, but could not tolerate a higher dose, also did not  tolerate Lyrica in the past for neurogenic pain.   IMPRESSION:  Lumbar spinal stenosis with radicular pain, impeding self-  care mobility, has not had good response to physical therapy or  injections and her pain medications are becoming less effective.   I explained that the coming surgery be best for her to reduce her pain  medicines overall.  To that effect, we will stop her Darvocet and then  continue on her fentanyl patch 25 mcg.  She should take off her last  patch on Sunday that will allow slow taper of the fentanyl  preoperatively and this should allow her to have more superior  postoperative analgesia.   I discussed with the patient and agrees with plan.  I will see her back  on a p.r.n. basis.   I will not plan on seeing her on a routine basis due  to her upcoming surgery.      Charlett Blake, M.D.  Electronically Signed     AEK/MedQ  D:  01/09/2009 13:40:10  T:  01/10/2009 03:34:03  Job #:  [id number]   cc:   Sandy Salaam. Shawna Orleans, DO  [place], [place] [address]   Cooper Render. Pool, M.D.  Fax: [phone]

## 2010-12-17 NOTE — Assessment & Plan Note (Signed)
Date of last visit September 21, 2008.   The patient is a 65 year old female with lumbar spinal stenosis and  facet syndrome.  She has had good relief with L3, L4, L5 medial branch  blocks; however, the radiofrequency did not give her the same amount of  relief.  She has some post injection soreness, but this has resolved.  She has some right greater than left lower extremity radicular-type  discomfort and because of this we ordered an MRI of the lumbar spine  without contrast.  This did demonstrate a progression of disk  degeneration at L3-4 level.  There was some right foraminal encroachment  in the lateral recess on the right, facet degeneration at this level.  There was a grade 1 anterior slip L4 and L5 with advanced disk facet  degeneration, moderate-to-severe spinal stenosis that has progressed as  well.  L5-S1 level looked relatively clean.   She has had no bowel or bladder dysfunction.  Her pain level was around  7/10, described as constant, it goes down to her right leg above the  ankle, left leg just involves the thigh.   Her functional status is independent.   CURRENT MEDICATIONS:  1. Fentanyl patch 25 mcg q.72 h.  2. Darvocet-N 100 t.i.d. or q.i.d.   Blood pressure 142/82, pulse 55, respirations 18, and O2 saturation 94%  on room air.  GENERAL:  No acute stress.  Mood and affect appropriate.  Gait is  normal.  She has no evidence of toe drag or knee instability.  Her lower extremity strength is normal.  She has good deep tendon  reflexes at the knees, but reduced in bilateral ankles.  She has tenderness to palpation on paraspinal muscles more on the right  than on the left side, around the L4-5 region.  She has 50% range in  forward flexion and extension.   IMPRESSION:  1. Lumbar spinal stenosis at L3-4 and L4-5 levels.  She has some      radicular discomfort and I would recommend epidural steroid      injection for this.  2. In terms of lumbar axial pain, given she  has spondylolisthesis, she      may benefit for fusion at this level, would recommend neurosurgical      evaluation which she is already obtaining with Dr. Annette Stable.   I will see the patient back in a couple of months.  She will have a  nursing visit in 1 month.  She states that she would like to hold off on  any type of injections until she sees what is going on in terms of her  surgery and with insurance changes.      Charlett Blake, M.D.  Electronically Signed     AEK/MedQ  D:  10/19/2008 13:58:12  T:  10/20/2008 01:08:54  Job #:  [id number]   cc:   Sandy Salaam. Shawna Orleans, DO  [place], [place] [address]   Cooper Render. Pool, M.D.  Fax: [phone]

## 2010-12-17 NOTE — Procedures (Signed)
Kimberly Irwin, STOFFER                 ACCOUNT NO.:  [id number]   MEDICAL RECORD NO.:  [id number]          PATIENT TYPE:  REC   LOCATION:  [facility]                          FACILITY:  [facility]   PHYSICIAN:  Charlett Blake, M.D.DATE OF BIRTH:  1945/10/07   DATE OF PROCEDURE:  DATE OF DISCHARGE:                               OPERATIVE REPORT   PROCEDURE:  Right L5 dorsal ramus retraction, right L4 medial branch  block, right L3 medial branch block under fluoroscopic guidance.   INDICATIONS:  Lumbar axial pain.  She had a 3-day relief of the greater  than 50% with identical procedure approximately 1 month ago.  She has  had recurrence of pain.  Intercurrent pain level was around 8/10.  Her  Oswestry disability scale 42%.   Pain only partially response to narcotic analgesic medications.   Informed consent was obtained after describing risks and benefits of the  procedure with the patient.  These include bleeding, bruising, and  infection.  She elects to proceed and has given written consent.  The  patient was placed prone on fluoroscopy table.  Betadine prep, sterile  drape, 25-gauge 1-1/2 needle was used to anesthetize skin and  subcutaneous tissue with 1% lidocaine x2 mL.  A 22-gauge 3-1/2-inch  spinal needle was inserted under fluoroscopic guidance.  First starting  at the right S1 SAP sacral ala junction bone contact made and confirmed  with lateral imaging.  Omnipaque 180 x 0.5 mL demonstrated no trans  uptake, then 0.5 mL of a solution containing 1 mL of 4 mg/mL  dexamethasone and 2 mL of 2% MPF lidocaine was injected.  Then, the  right L5 SAP transverse process junction targeted bone contact made,  confirmed.  Omnipaque 180 x 0.5 mL demonstrated no intravascular uptake.  Then, 0.5 mL of dexamethasone lidocaine solution was injected into the  right L4.  SAP transverse junction targeted bone contact made,  confirmed.  Omnipaque 180 x 0.5 mL demonstrated no intravascular uptake.  Then,  0.5 mL of dexamethasone lidocaine solution was injected.  The  patient tolerated the procedure well.  Pre- and postinjection vitals  stable.  Pre-injection pain level was 8/10 and post injection was 0/10.  We will schedule for a radiofrequency neurotomy.      Charlett Blake, M.D.  Electronically Signed     AEK/MEDQ  D:  08/08/2008 08:57:24  T:  08/08/2008 20:44:55  Job:  [id number]   cc:   Sandy Salaam. [place], [place]  [place], [place] [address]

## 2010-12-18 ENCOUNTER — Encounter: Payer: Self-pay | Admitting: Internal Medicine

## 2010-12-20 NOTE — Op Note (Signed)
   Kimberly Irwin, Kimberly Irwin                             ACCOUNT NO.:  [id number]   MEDICAL RECORD NO.:  [id number]                   PATIENT TYPE:  OIB   LOCATION:  NA                                   FACILITY:  [facility]   PHYSICIAN:  Nicanor Alcon, M.D.              DATE OF BIRTH:  [birth year]   DATE OF PROCEDURE:  06/20/2002  DATE OF DISCHARGE:                                 OPERATIVE REPORT   PREOPERATIVE DIAGNOSIS:  Mediastinal adenopathy, questionable right lower  lobe lesion.   POSTOPERATIVE DIAGNOSIS:  Mediastinal adenopathy, question right lower lobe  lesion.   OPERATION PERFORMED:  Fiberoptic bronchoscopy and mediastinoscopy.   DESCRIPTION OF PROCEDURE:  After adequate general anesthesia, the fiberoptic  bronchoscope was passed through the endotracheal tube. The right upper lobe,  right middle lobe, and right lower lobe orifices were normal.  The left  upper lobe and left lower lobe was additional normal.  Washings and  brushings were taken from the endotracheal tube bronchial tree, and then the  anterior neck was prepped and draped in the usual sterile manner.  The  patient had a fairly marked mediastinal adenopathy and had a regular lesion  in the medial based or segment of the right lower lobe.  She also had a  previous thyroidectomy.  The middle of the thyroidectomy scar, one 2 cm  incision was made and was carried down to the left carotid to the  subcutaneous tissue.  The pretracheal fascia was entered. The video  mediastinoscope was inserted through the mediastinoscope biopsies of 4R and  4L nodes were done.  Biopsies were sent for culture as well as frozen  section.  The strap muscles were closed with 2-0 Vicryl, subcutaneous tissue  with 3-0 Vicryl and subcuticular stitch with 3-0 Vicryl, and Dermabond for  the skin.  The patient was returned to the recovery room in stable  condition.                                               Nicanor Alcon, M.D.    DPB/MEDQ   D:  06/20/2002  T:  06/20/2002  Job:  [id number]

## 2010-12-20 NOTE — Assessment & Plan Note (Signed)
[facility]                             PULMONARY OFFICE NOTE   NAME:Kimberly Irwin, Kimberly Irwin                        MRN:          [id number]  DATE:07/21/2006                            DOB:          [birth year]    SUBJECTIVE:  Ms. Denson is a 65 year old woman followed by Dr. Derrill Kay for sarcoidosis. This was diagnosed several years ago by  mediastinoscopy in the setting of abnormal chest x-ray. She tells me  that her sarcoidosis has never been active and she has never been  treated with systemic corticosteroids. She also sees Dr. Patsey Berthold for  allergic rhinitis. She tells me today that she is doing well. She rarely  needs to use any supplemental albuterol. The only times when it becomes  necessary are when she has been exposed to second hand smoke. Her  allergies are bothersome all year round. She has been using Nasonex  p.r.n. She formerly was on Zyrtec and had good success with controlling  her allergy symptoms on this medications but this was stopped due to a  change in her insurance. Finally, she has chronic back discomfort due to  degenerative disk disease. She has seen Dr. Ellene [facility] at [facility] for this and has been told that her disk  disease and arthritis make it difficult to offer her surgery but they  have offered her injections for temporary relief. She has not pursued  this to date.   MEDICATIONS:  1. Vytorin 10/20 mg 1/2-tablet daily.  2. Synthroid 100 mcg daily.  3. Aspirin 81 mg daily.  4. Prilosec 20 mg daily.  5. HCTZ 25 mg daily.  6. Nasonex 2 sprays to each nostril p.r.n.  7. Duragesic patch 12.5 mcg q.72 hours.  8. Propranolol (dose unknown) 1 b.i.d.  9. Albuterol 2 puffs q.4 h p.r.n. for shortness of breath which she      rarely needs.   PHYSICAL EXAMINATION:  GENERAL:  This is a pleasant, somewhat overweight  woman who is in no distress on room air.  VITAL SIGNS:  Weight 181 pounds, temperature 98.1,  blood pressure  134/84, heart rate 51, SPO2 97% on room air.  NECK:  Supple without lymphadenopathy. She has no stridor.  LUNGS:  Clear to auscultation bilaterally. She does have some wheezing  on a forced expiration.  HEART:  Regular rate and rhythm without murmurs, rubs or gallops.  ABDOMEN:  Obese, soft, nontender with positive bowel sounds.  NEUROLOGIC:  She has a grossly nonfocal exam.  EXTREMITIES:  Without cyanosis, clubbing or edema.   IMPRESSION:  1. Sarcoidosis that has been inactive. She is very well compensated      and very rarely requires supplemental bronchodilators.  2. Allergic rhinitis. We discussed that she may be able to go back to      Zyrtec as it is going to become generic soon. She will look into      this when this becomes available. In the meantime, she will      continue her Nasonex. We discussed possibly increasing this to  daily scheduling instead of p.r.n. and she will try this and see if      helps her post nasal drip.  3. She will followup with Dr. Ellene Route with neurosurgery regarding her      degenerative disk disease and back pain.  4. She will followup with Dr. Shawna Orleans in primary care for her other      medical issues.  5. Ms. Calvillo will followup with Dr. Patsey Berthold in 4 months for her      regularly scheduled visit or sooner should she have any worsening      symptoms.     Collene Gobble, MD  Electronically Signed    RSB/MedQ  DD: 07/21/2006  DT: 07/22/2006  Job #: [id number]   cc:   Renold Don, MD  Sandy Salaam. Shawna Orleans, DO  Earleen Newport, M.D.

## 2010-12-20 NOTE — Assessment & Plan Note (Signed)
[facility]                             PULMONARY OFFICE NOTE   NAME:Irwin, Kimberly ARTERBURN                        MRN:          [id number]  DATE:11/23/2006                            DOB:          [birth year]    This is a very pleasant 65 year old white female who follows up for  sarcoidosis.  She also has mild airways reactivity related to perennial  rhinitis.  The patient presents today for follow up.  Her questions to  [place] are about disability on the basis of chronic low back pain.  The  patient has been evaluated by Dr. Kristeen Miss who has recommended that  the try an epidural extraforaminal nerve block to the L4 nerve; however,  she has declined this, mainly because she was informed by one of my  partners that this was just a temporary fix.  I did explain to the  patient to the patient that these types of injections are actually good  to at least get the patient able to participate in physical therapy, and  hopefully the physical therapy can help with the back pain long term.  The patient understood what the intention in these injections is.  I did  also inform her that because of my subspecialty, I basically cannot  comment on her chronic back pain as the source of disability, but that  she should discuss this with her primary care physician, Dr. Shawna Orleans.  She  understands this.   Kimberly Irwin has no dizziness issues; however, she does have occasional cough,  particularly aggravated by postnasal drip.  She has in the past shown  evidence of airways reactivity, particularly when her postnasal drip is  exacerbated.  I have recommended that she undergo allergy evaluation by  Dr. Baird Lyons, but she would like to think about this.   CURRENT MEDICATIONS:  As noted on the intake sheet.  These have been  reviewed and are accurate.   PHYSICAL EXAMINATION:  VITAL SIGNS:  Noted.  Oxygen saturation is 95% on  room air.  GENERAL:  This is an obese white female who is  in no acute distress.  HEENT:  Unremarkable.  NECK:  Supple.  No adenopathy noted.  No JVD.  LUNGS:  Clear to auscultation bilaterally.  CARDIAC:  Regular rate and rhythm.  No rubs, murmurs or gallops noted.  EXTREMITIES:  The patient has no cyanosis, no clubbing, no edema noted.   IMPRESSION:  1. Sarcoidosis.  The patient is well compensated in this regard.  She      is currently off all steroids.  2. Airways reactivity associated with perennial rhinitis.  3. Perennial rhinitis.  4. Back pain.   PLAN:  1. I would recommend that the patient be evaluated by Dr. Baird Lyons for her chronic rhinitis issues.  She has to consider this.      Currently, she would not want to undergo this.  2. For her airways reactivity, I would start her on Asmanex one      inhalation daily in the evening.  She is to  continue using her      Proventil p.r.n.  3. I did encourage her to undergo an epidural spinal injection to see      if this will help her participate in physical therapy with regards      to her back pain.  She is to discuss this with Dr. Shawna Orleans.  I would      recommend evaluation by Dr. Sharion Settler, as he has expertise in      this regard.  4. The patient is to follow up here on a p.r.n. basis.  She will      continue to follow up with Dr. Shawna Orleans.     Renold Don, MD  Electronically Signed    CLG/MedQ  DD: 11/23/2006  DT: 11/23/2006  Job #: [id number]   cc:   Sandy Salaam. Shawna Orleans, DO

## 2010-12-20 NOTE — Assessment & Plan Note (Signed)
[place]                               PULMONARY OFFICE NOTE   NAME:Boylan, IMAN REINERTSEN                        MRN:          [id number]  DATE:03/06/2006                            DOB:          [birth year]    Ms. Rolison is a 65 year old female that I follow here for sarcoid, which is  inactive.  She also had issues with chronic rhinitis and airways reactivity.  The patient presents today for followup.  She actually has no respiratory  complaints.  Her main complaint is that of back pain.  She has been seen by  Orthopedics in the past, but has not been satisfied with her interventions  and also, as a matter of fact, feels that the interventions instituted by  Orthopedics actually caused her more difficulties.  The patient recently has  been placed on a Duragesic patch, but would like a second opinion as to what  can be done with regards to her back care.   Again, she has no respiratory complaints.   CURRENT MEDICATIONS:  As noted on the intact sheet and include Vytorin,  Synthroid, aspirin, hydrochlorothiazide, Nasonex on an as-needed basis,  Duragesic patch, Prilosec.  She uses albuterol p.r.n., but is otherwise on  no chronic inhalers, having tapered herself off of these.   PHYSICAL EXAMINATION:  VITAL SIGNS:  Noted.  The patient is afebrile.  Oxygen saturation is 94% on room air.  GENERAL:  This is a moderately obese female who is in no acute distress, but  is somewhat chronically ill-appearing.  She is fully ambulatory.  HEENT:  Examination reveals some nasal turbinate edema, but otherwise  unremarkable.  NECK:  Supple.  No adenopathy noted.  No JVD.  LUNGS:  Clear to auscultation bilaterally.  CARDIAC:  Regular rate and rhythm, no rubs, murmurs or gallops.  EXTREMITIES:  The patient has no cyanosis, no clubbing and no edema noted.   IMPRESSION:  1.  Sarcoidosis:  The patient is actually well compensated in this regard      and is currently  inactive.  2.  Back pain:  A chronic problem for the patient.  She would like a second      opinion and management for the same.   PLAN:  1.  Plan will be to refer the patient to [facility]      for evaluation.  I will defer any necessary imaging testing to the      discretion of neurosurgeon consultant.  2.  Followup will be in 4 months' time.  We will obtain a full set of PFTs      at that time.  She is to      contact us prior to that time should any problems arise in the interim.      She is to continue her medication regimen as directed by Dr. Shawna Orleans.                                   Renold Don,  MD   CLG/MedQ  DD:  03/06/2006  DT:  03/06/2006  Job #:  [id number]

## 2010-12-20 NOTE — Consult Note (Signed)
NAMESHERIA, Kimberly Irwin                             ACCOUNT NO.:  [id number]   MEDICAL RECORD NO.:  [id number]                   PATIENT TYPE:  INP   LOCATION:  1823                                 FACILITY:  [facility]   PHYSICIAN:  W. Doristine Church., M.D.         DATE OF BIRTH:  [birth year]   DATE OF CONSULTATION:  DATE OF DISCHARGE:  10/06/2003                                   CONSULTATION   HISTORY:  This 65 year old female is seen for evaluation of chest  discomfort.  The patient has a strongly positive family history of cardiac  disease and had a cardiac catheterization done by me because of a previously-  negative stress test on Dec 31, 1998, at which point in time she had  completely normal coronary arteries, which were tortuous.  There was no  calcification noted at that time.  She has an abnormal EKG with anterior T-  wave inversions which have been present for some time.  According to the  records, the last one I can see located is in 2003 and has been abnormal for  some time.  She has a previous diagnosis of sarcoidosis but refuses to take  prednisone for it and is on multiple inhalers.  She is followed by Dr.  Patsey Berthold for this.  She was admitted in December to the emergency room,  where she was found to have gallbladder sludge and was seen by the  gastroenterologist during that admission.  She had significant abdominal  pain and had abdominal lymphadenopathy and was recommended to follow up with  her medical doctor after Dr. Hassell Done had seen her.  She developed an upper  respiratory infection with a profuse rhinorrhea, runny nose, and then a  nonproductive cough and a tight feeling in her chest as when she got a cold.  She did not describe pressure or heaviness but found it difficult to breathe  and saw her primary doctor today, who did an EKG, which noted it to be  significantly abnormal.  The abnormality was in the anterior leads, and he  was concerned that she was having  angina.  He gave her a prescription  for  Levaquin and asked her to come to the emergency room to be evaluated by  [facility].  The physician's assistant saw her for [facility], and the patient requested that I be consulted and I was called.  Since that period of time she has had three sets of cardiac enzymes, which  have all been negative, and an EKG that shows anterior T-wave inversions in  V1-V3, some slight changes laterally, but which are unchanged from previous  EKGs going all the way back to 2003.  Chest x-ray shows bibasilar  atelectasis.   PHYSICAL EXAMINATION:  GENERAL:  On examination she is a pleasant woman who  is currently in no acute distress and not complaining of symptoms.  VITAL SIGNS:  Her blood  pressure is currently 130/70.  Her pulse was 70.  HEENT:  Unremarkable.  CHEST:  Lungs were clear without wheezes or rales.  CARDIAC:  Normal S1, S2.  There is no S3.  There is no murmur.  ABDOMEN:  Soft, nontender.  EXTREMITIES:  There is no edema.  Pulses are present and are 2+.   EKG shows T-wave inversions in the anterior leads in V2 and V3, nonspecific  changes laterally.  Her laboratory work evaluated shows white count of 7300,  hemoglobin of 14, hematocrit of 41.5.  PT and PTT are normal.  Chemistry  panel shows a glucose of 103.  Liver enzymes are all normal.  Sets of serial  cardiac markers are negative.   IMPRESSION:  1. Clinical upper respiratory tract infection.  2. Atypical chest tightness that does not sound like angina.  3. History of normal coronary arteries in 2000 with a negative Cardiolite     scan in December 2004 by patient's history.  4. History of sarcoidosis, currently under treatment with inhalers but no     steroids.  5. Recent history of abdominal pain.  6. History of reflux esophagitis.   RECOMMENDATIONS:  At the present time the pain does not sound cardiac to me.  The EKG changes appear chronic.  I think that she  may go home from the  emergency room and should take the Levaquin which was prescribed to her.  I  would like to see her back in follow-up in two weeks.                                               Kimberly Irwin., M.D.    WST/MEDQ  D:  10/06/2003  T:  10/06/2003  Job:  [id number]   cc:   Tamsen Roers, M.D., [place], [place]

## 2010-12-20 NOTE — Consult Note (Signed)
Kimberly Irwin is a 65 year old female with a history of back pain dating back  to over 2 years.  She has had workup including MRI of the lumbar spine,  last performed in 2007 which I was able to review online.  She has a  multilevel degenerative changes, most pronounced at L4-5 where there is  moderate central stenosis based on a shallow base protrusion and facet  arthropathy.  There is some degenerative spondylolisthesis at that  level.  She has undergone surgical evaluation and epidural steroid  injections were recommended.  However, the patient was concerned that  this would only be a temporary effect.  She has been on fentanyl patch  initially at 25 mcg and now at 50 mcg every 72 hours.  On the third day,  she feels like there is reduced effectiveness.   Her pain is described as dull and aching involving primarily the right  side of low back and radiating down into the right leg, although mainly  in the thigh and above the knee rather than below the knee.  Her sleep  is fair.  Pain is worse with walking and sitting and standing.  She does  not have any numbness or tingling in the right lower extremity.  Her  pain does interfere with household duties.  She is able to climb steps,  able to drive, and is independent with her activities of daily living.   PAST MEDICAL HISTORY:  Significant for hypothyroidism as well as  hypertension.   MEDICATIONS:  Metoprolol, propranolol, low-dose aspirin and  hydrochlorothiazide, as well as Vytorin, levothyroxine, Enablex, and  Prozac.   Further history elicited includes some pain that goes down into the  right leg that described as a more electric feeling, but only at night.   ALLERGIES:  PENICILLIN, FLAGYL CRESTOR, and LYRICA.   SOCIAL HISTORY:  Lives with her husband.  She no longer works.   PHYSICAL EXAMINATION:  GENERAL:  No acute distress.  Mood and affect are  appropriate.  NECK:  Range of motion is full.  EXTREMITIES:  Her upper extremity  range of motion and strength are full.  Joint stability is normal.  On her lower extremity, she has normal  strength, normal sensation, and normal joint stability.  Her back has  some tenderness to palpation of the lumbosacral junction.  She has pain  primarily with extension and only minimal pain at end-range flexion.  She has normal deep tendon reflexes in the upper and lower extremities.  Straight leg raising test is negative.   IMPRESSION:  1. Lumbar pain with right lower extremity radiation.  It appears to be      of 2 varieties.  She has a constant aching pain that is more      present during the day and does not extend below the knee.  She      does have some electric-type sensation at night which does      interfere with sleep, which also radiates beyond the knee into the      leg.  I suspect that the more aching discomfort is likely due to      radiating pain from lumbar facet and we will do a diagnostic medial      branch blocks to see if this is a source of pain.  2. In terms of the more neuropathic component to her pain, we will      start her on Neurontin at night.   In terms of her  narcotic analgesics, she indicates that at one time she  was relatively well controlled with just small amounts of Darvocet i.e.  2 tablets per day.  If this is true, this is certainly be a much lower  dose of narcotic analgesic than her fentanyl.  We may, in fact, start  her on a taper and see if she can be maintained on the propoxyphene at  very low dose.   Her Oswestry Disability Index today 30%, which is in the moderate range.  Thank you for this interesting consultation.  I will keep you apprised  of her progress.  We will set her up for medial branch blocks on the  right side and if she fails, we will consider an epidural injection  versus sacroiliac depending on results.      Charlett Blake, M.D.  Electronically Signed     AEK/MedQ  D:06/22/2008 09:43:35  T:06/22/2008  22:07:18  Job #:  [id number]

## 2010-12-20 NOTE — Consult Note (Signed)
NAMEKAILIA, STARRY                             ACCOUNT NO.:  [id number]   MEDICAL RECORD NO.:  [id number]                   PATIENT TYPE:  EMS   LOCATION:  MAJO                                 FACILITY:  [facility]   PHYSICIAN:  Isabel Caprice. Hassell Done, M.D.             DATE OF BIRTH:  Mar 01, 1946   DATE OF CONSULTATION:  08/03/2003  DATE OF DISCHARGE:                                   CONSULTATION   CHIEF COMPLAINT:  Abdominal pain.   HISTORY:  Kimberly Irwin is a 64 year old lady who was seen by me on August 03, 2003 who on August 02, 2003 had onset of nausea, vomiting, and some  diarrhea at work.  She was sent home where she continued to get weaker and  was ultimately brought to the emergency department by her husband.  She was  seen by Dr. Monia Pouch who was impressed that she seemed to have some upper  abdominal soreness, particularly on the right side and a gallbladder  ultrasound was ordered.  This suggested some gallbladder sludge.  I was  contacted about her and discussed the case with him and requested a CT scan  abdomen.  This has been done and her laboratory has been reviewed.  Laboratory included a white count of 7900 and CT scan was remarkable in that  her gallbladder did not appear to be consistent with any acute cholecystitis  but she did have 2 cm lymph nodes in the periportal region.  Her stomach was  not distended and we could not rule out any kind of gastric lesions.  There  was no evidence of any pancreatic masses or anything else to suggest a  definite underlying cancer.   The patient has a history of sarcoidosis.  Apparently, is followed by Dr.  Derrill Kay and has a history of GERD followed by Dr. Verl Blalock.   My impression is that this patient probably had an episode of  gastroenteritis.  I have suggested that she get back with Dr. Rex Kras and she  will likely be referred back to Dr. Sharlett Iles for upper endoscopy to make  sure that she does not have a gastric  mass that is metastatic to the  periportal region.  It could be that these nodes are just related to  sarcoidosis.  The gallbladder sludge could be followed up with a follow-up  ultrasound in a few months depending on her symptomatology.  At the present  time I see no indication for a laparoscopic cholecystectomy.                                               Isabel Caprice Hassell Done, M.D.    MBM/MEDQ  D:  08/03/2003  T:  08/03/2003  Job:  [id number]   cc:  James Little  [place] [place]  [place]  [place] [address]  Fax: [phone]   Derrill Kay, M.D. [facility]   Loralee Pacas. Sharlett Iles, M.D. [facility]

## 2010-12-20 NOTE — Discharge Summary (Signed)
NAMEKRISS, PERLEBERG                 ACCOUNT NO.:  [id number]   MEDICAL RECORD NO.:  [id number]          PATIENT TYPE:  INP   LOCATION:  3016                         FACILITY:  [facility]   PHYSICIAN:  Cooper Render. Pool, M.D.    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   HISTORY OF PRESENT ILLNESS:  Ms. Fleet is a 65 year old female with  history of symptomatic lumbar stenosis.  She presents now for  decompression and fusion at L3-4 and L4-5.   HOSPITAL COURSE:  The patient was taken to the operating room and  underwent an uncomplicated U4-3 and C3-8 decompression and fusion with  instrumentation was performed.  Postoperatively, the patient has done  very well.  Her back and lower extremity symptoms were much improved.  Neurological exam was intact.  Wound is healing well.  She will be  discharged home.  She will follow up in my office in 1 week.           ______________________________  Cooper Render. Pool, M.D.     HAP/MEDQ  D:  02/27/2009  T:  02/28/2009  Job:  [id number]

## 2011-01-15 ENCOUNTER — Ambulatory Visit: Payer: Medicare Other | Admitting: Internal Medicine

## 2011-01-16 ENCOUNTER — Encounter: Payer: Self-pay | Admitting: Family Medicine

## 2011-01-16 ENCOUNTER — Ambulatory Visit (INDEPENDENT_AMBULATORY_CARE_PROVIDER_SITE_OTHER): Payer: Medicare Other | Admitting: Family Medicine

## 2011-01-16 VITALS — BP 120/80 | HR 54 | Temp 97.9°F | Resp 20 | Ht 64.0 in | Wt 190.0 lb

## 2011-01-16 DIAGNOSIS — E785 Hyperlipidemia, unspecified: Secondary | ICD-10-CM

## 2011-01-16 DIAGNOSIS — G894 Chronic pain syndrome: Secondary | ICD-10-CM

## 2011-01-16 DIAGNOSIS — I10 Essential (primary) hypertension: Secondary | ICD-10-CM

## 2011-01-16 DIAGNOSIS — E039 Hypothyroidism, unspecified: Secondary | ICD-10-CM

## 2011-01-16 MED ORDER — BISOPROLOL FUMARATE 5 MG PO TABS: 5.0000 mg | ORAL_TABLET | Freq: Every day | ORAL | Status: DC

## 2011-01-16 MED ORDER — EZETIMIBE-SIMVASTATIN 10-10 MG PO TABS: 1.0000 | ORAL_TABLET | Freq: Every day | ORAL | Status: DC

## 2011-01-16 MED ORDER — GABAPENTIN 300 MG PO CAPS: 300.0000 mg | ORAL_CAPSULE | Freq: Three times a day (TID) | ORAL | Status: DC

## 2011-01-16 MED ORDER — TRAMADOL HCL 50 MG PO TABS: ORAL_TABLET | ORAL | Status: DC

## 2011-01-16 NOTE — Progress Notes (Signed)
OFFICE NOTE  01/20/2011  CC:  Chief Complaint  Patient presents with  . Hyperlipidemia    non fasting  . Hypertension  . Hypothyroidism     HPI:   Patient is a 65 y.o. Caucasian female who is here for f/u osteoarthritis/chronic low back and hip pains, hypothyroidism, HTN, and hyperlipidemia. Compliant with meds, no side effects.  Laments about still having chronic low back and hip pain even after having surgical procedures designed to help them. Says neurontin at present dosing (142m qam and 2018mqhs) and ultram at present dosing (50 mg bid) are not very helpful. However, she wants to avoid really strong pain meds. She also voices dissatisfaction with the mixed message she hears from [place] her medical care.  For example, she says that she is always asked when she had her last pelvic exam and pap smear and when she says it has been a few years she is told that she needs to get one, yet no one ever schedules an appt for this or offers to do one.    Pertinent PMH:  Hypothyroidism Hyperlipidemia HTN Chronic LB and right hip pain: s/p L spine surgery 02/2009 and right hip surgery 08/2009. Sarcoidosis  MEDS;   Outpatient Prescriptions Prior to Visit  Medication Sig Dispense Refill  . aspirin 81 MG tablet Take 81 mg by mouth daily.        . celecoxib (CELEBREX) 200 MG capsule Take 200 mg by mouth daily as needed.        . Cholecalciferol (VITAMIN D) 1000 UNITS capsule Take 2 tablets by mouth at bedtime       . Cinnamon 500 MG capsule Take 2,000 mg by mouth daily.        . hydrochlorothiazide 25 MG tablet Take 25 mg by mouth daily.        . Marland Kitchenevothyroxine (SYNTHROID, LEVOTHROID) 100 MCG tablet Take 100 mcg by mouth daily.        . Omega-3 Fatty Acids (FISH OIL) 1200 MG CAPS Take 2 capsules by mouth daily.        . Marland Kitchenmeprazole (PRILOSEC) 20 MG capsule Take 20 mg by mouth 2 (two) times daily.        . bisoprolol (ZEBETA) 5 MG tablet Take 5 mg by mouth daily.        . Marland Kitchenezetimibe-simvastatin (VYTORIN) 10-10 MG per tablet Take 1 tablet by mouth at bedtime.        . gabapentin (NEURONTIN) 100 MG capsule Take 1 capsule (100 mg total) by mouth 3 (three) times daily.  90 capsule  3  . fluticasone (FLONASE) 50 MCG/ACT nasal spray 2 sprays by Nasal route daily.        . Marland Kitchenriamcinolone (KENALOG) 0.1 % cream Apply topically 2 (two) times daily.        . traMADol (ULTRAM) 50 MG tablet Take 50 mg by mouth daily as needed.          PE: Blood pressure 120/80, pulse 54, temperature 97.9 F (36.6 C), temperature source Oral, resp. rate 20, height _0  (1.626 m), weight 190 lb (86.183 kg), SpO2 100.00%. Gen: Alert, well appearing.  Patient is oriented to person, place, time, and situation. Chest: symmetric expansion, nonlabored respirations.  Clear and equal breath sounds in all lung fields.   CV: RRR, no m/r/g.  Peripheral pulses 2+ and symmetric. BACK: moderately decreased ROM of L spine in flexion and extension and lateral bending. No neuro deficits.  Lab Results  Component Value Date   TSH 0.51 09/18/2010   Lab Results  Component Value Date   CHOL 177 09/18/2010   CHOL 238* 03/26/2009   CHOL 170 01/18/2008   Lab Results  Component Value Date   HDL 42.00 09/18/2010   HDL 52 03/26/2009   HDL 37.1* 01/18/2008   Lab Results  Component Value Date   LDLCALC 106* 09/18/2010   LDLCALC 139* 03/26/2009   LDLCALC 104* 01/18/2008   Lab Results  Component Value Date   TRIG 143.0 09/18/2010   TRIG 233* 03/26/2009   TRIG 143 01/18/2008   Lab Results  Component Value Date   CHOLHDL 4 09/18/2010   CHOLHDL 4.6 Ratio 03/26/2009   CHOLHDL 4.6 CALC 01/18/2008   No results found for this basename: LDLDIRECT     Chemistry      Component Value Date/Time   NA 146* 10/08/2010 1006   K 4.9 10/08/2010 1006   CL 103 10/08/2010 1006   CO2 25 10/08/2010 1006   BUN 20 10/08/2010 1006   CREATININE .9 10/08/2010 1006      Component Value Date/Time   CALCIUM 10.2 10/08/2010 1006   ALKPHOS 83  10/08/2010 1006   AST 43* 10/08/2010 1006   ALT 48* 10/08/2010 1006   BILITOT 1.1 10/08/2010 1006       IMPRESSION AND PLAN:  Chronic pain syndrome DDD/osteoarthritis: :L-spine and right hip.   Medical mgmt not optimal, so will titrate neurontin up to 325m po tid and ultram up to 1019mtid prn. Therapeutic expectations and side effect profile of medication discussed today.  Patient's questions answered.   HTN (hypertension), benign Problem stable.  Continue current medications and diet appropriate for this condition.  We have reviewed our general long term plan for this problem and also reviewed symptoms and signs that should prompt the patient to call or return to the office.   HYPERLIPIDEMIA Problem stable.  Continue current medications and diet appropriate for this condition.  We have reviewed our general long term plan for this problem and also reviewed symptoms and signs that should prompt the patient to call or return to the office.   HYPOTHYROIDISM Problem stable.  Continue current medications and diet appropriate for this condition.  We have reviewed our general long term plan for this problem and also reviewed symptoms and signs that should prompt the patient to call or return to the office.      FOLLOW UP:  Return in about 2 months (around 03/18/2011) for Pap/pelvic with MeDebbrah Alarlease (pt request)+ TSH, CMET,and lipids.

## 2011-01-20 DIAGNOSIS — G894 Chronic pain syndrome: Secondary | ICD-10-CM | POA: Insufficient documentation

## 2011-01-20 DIAGNOSIS — I10 Essential (primary) hypertension: Secondary | ICD-10-CM | POA: Insufficient documentation

## 2011-01-20 NOTE — Assessment & Plan Note (Signed)
Problem stable.  Continue current medications and diet appropriate for this condition.  We have reviewed our general long term plan for this problem and also reviewed symptoms and signs that should prompt the patient to call or return to the office.

## 2011-01-20 NOTE — Assessment & Plan Note (Signed)
DDD/osteoarthritis: :L-spine and right hip.   Medical mgmt not optimal, so will titrate neurontin up to 351m po tid and ultram up to 1074mtid prn. Therapeutic expectations and side effect profile of medication discussed today.  Patient's questions answered.

## 2011-01-20 NOTE — Assessment & Plan Note (Signed)
Problem stable.  Continue current medications and diet appropriate for this condition.  We have reviewed our general long term plan for this problem and also reviewed symptoms and signs that should prompt the patient to call or return to the office.  

## 2011-03-07 ENCOUNTER — Other Ambulatory Visit: Payer: Self-pay | Admitting: Internal Medicine

## 2011-03-19 ENCOUNTER — Ambulatory Visit (INDEPENDENT_AMBULATORY_CARE_PROVIDER_SITE_OTHER): Payer: Medicare Other | Admitting: Family

## 2011-03-19 ENCOUNTER — Other Ambulatory Visit (HOSPITAL_COMMUNITY)
Admission: RE | Admit: 2011-03-19 | Discharge: 2011-03-19 | Disposition: A | Discharge status: Home / Self Care | Payer: Medicare Other | Source: Ambulatory Visit | Attending: Family | Admitting: Family

## 2011-03-19 ENCOUNTER — Encounter: Payer: Self-pay | Admitting: Family

## 2011-03-19 VITALS — BP 132/88 | HR 62 | Temp 98.0°F | Ht 64.0 in | Wt 186.0 lb

## 2011-03-19 DIAGNOSIS — E785 Hyperlipidemia, unspecified: Secondary | ICD-10-CM

## 2011-03-19 DIAGNOSIS — Z124 Encounter for screening for malignant neoplasm of cervix: Secondary | ICD-10-CM

## 2011-03-19 DIAGNOSIS — Z01419 Encounter for gynecological examination (general) (routine) without abnormal findings: Secondary | ICD-10-CM | POA: Insufficient documentation

## 2011-03-19 DIAGNOSIS — I10 Essential (primary) hypertension: Secondary | ICD-10-CM

## 2011-03-19 DIAGNOSIS — Z13820 Encounter for screening for osteoporosis: Secondary | ICD-10-CM

## 2011-03-19 DIAGNOSIS — E039 Hypothyroidism, unspecified: Secondary | ICD-10-CM

## 2011-03-19 NOTE — Assessment & Plan Note (Signed)
Normal exam, pap performed.  Will order Bone density.

## 2011-03-19 NOTE — Progress Notes (Signed)
Subjective:    Patient ID: Kimberly Irwin, female    DOB: 11-Mar-1946, 65 y.o.   MRN: [id number]  HPI  Kimberly Irwin is a 65 yr old female who presents today for Pap smear.  Last pap smear was >6 yrs ago.  She has never had hysterectomy.  LMP was at age 59. Never had a bone density test.  Reports that she is up to date on mammograms.  No other concerns today. Reports that she completes regular self breast exams.     Review of Systems Past Medical History  Diagnosis Date  . Sarcoidosis   . Hyperlipidemia   . GERD (gastroesophageal reflux disease)   . Hypertension   . Thyroid disease     hypothyroidism  . Chronic low back pain   . DDD (degenerative disc disease)     L3-4 with facet arthropathy and stenosis  . Degenerative spondylolisthesis     L4-5 grade 1 with stenosis    History   Social History  . Marital Status: Married    Spouse Name: N/A    Number of Children: 2  . Years of Education: N/A   Occupational History  . Not on file.   Social History Main Topics  . Smoking status: Former Research scientist (life sciences)  . Smokeless tobacco: Not on file  . Alcohol Use: No  . Drug Use:   . Sexually Active:    Other Topics Concern  . Not on file   Social History Narrative   Married 40 years and has 2 children (2 daughters)Alcohol Use - noFormer Smoker - she quit 10 years ago, started when she was 35 and has had varying level of use of tobacco products from 1/2 pack to 3 packs per day.    Past Surgical History  Procedure Date  . Carpal tunnel release   . Thyroidectomy   . Back surgery 01/15/09    L3-4 and L4-5 decompressive laminectomy with bilateral L3, L4, and L5 decompressive foraminotomies, more than it would be required for simple interbody fusion alone.  . Back surgery 01/15/09    L3-4 and L4-5 posterior lumbar interbody fusion utilizing tanget interbody allograft wedge, Telamon interbody PEEk cage, and local autografting.  . Back surgery 01/15/09    L3, L4, and L5 posterolateral arthrodesis  using segmental pedicle screw fixation and local autografting.  . Joint replacement 09/13/09    Right Hip, Dr. Alvan Dame    Family History  Problem Relation Age of Onset  . Cancer Mother 9    Breast  . Heart disease Mother   . Hypertension Mother   . Hyperlipidemia Mother   . Heart attack Mother     Allergies  Allergen Reactions  . Metronidazole   . Penicillins   . Pregabalin     REACTION: Somnolence and dizziness  . Rosuvastatin     Current Outpatient Prescriptions on File Prior to Visit  Medication Sig Dispense Refill  . aspirin 81 MG tablet Take 81 mg by mouth daily.        . bisoprolol (ZEBETA) 5 MG tablet Take 1 tablet (5 mg total) by mouth daily.  30 tablet  6  . celecoxib (CELEBREX) 200 MG capsule Take 200 mg by mouth daily as needed.        . Cholecalciferol (VITAMIN D) 1000 UNITS capsule Take 2 tablets by mouth at bedtime       . Cinnamon 500 MG capsule Take 2,000 mg by mouth daily.        Marland Kitchen  ezetimibe-simvastatin (VYTORIN) 10-10 MG per tablet Take 1 tablet by mouth at bedtime.  30 tablet  6  . gabapentin (NEURONTIN) 300 MG capsule Take 1 capsule (300 mg total) by mouth 3 (three) times daily.  90 capsule  6  . hydrochlorothiazide 25 MG tablet TAKE ONE TABLET BY MOUTH EVERY DAY  90 tablet  0  . levothyroxine (SYNTHROID, LEVOTHROID) 100 MCG tablet Take 100 mcg by mouth daily.        . Omega-3 Fatty Acids (FISH OIL) 1200 MG CAPS Take 2 capsules by mouth daily.        Marland Kitchen omeprazole (PRILOSEC) 20 MG capsule Take 20 mg by mouth 2 (two) times daily.        . traMADol (ULTRAM) 50 MG tablet 1-2 tabs as needed every 6-8 hours for pain  180 tablet  2  . fluticasone (FLONASE) 50 MCG/ACT nasal spray 2 sprays by Nasal route daily.        Marland Kitchen triamcinolone (KENALOG) 0.1 % cream Apply topically 2 (two) times daily.          BP 132/88  Pulse 62  Temp(Src) 98 F (36.7 C) (Oral)  Ht _0  (1.626 m)  Wt 186 lb 0.6 oz (84.387 kg)  BMI 31.93 kg/m2       Objective:   Physical Exam    Constitutional: She appears well-developed and well-nourished.  Abdominal: Soft. Bowel sounds are normal. She exhibits no distension and no mass. There is no tenderness. There is no rebound and no guarding.  Genitourinary: Vagina normal. No vaginal discharge found.       Normal cervix without lesions. Normal adnexa, normal uterine size.          Assessment & Plan:

## 2011-03-19 NOTE — Patient Instructions (Signed)
Please complete fasting lab work on the first floor at your earliest convenience. Follow up in 3 months.

## 2011-03-25 ENCOUNTER — Ambulatory Visit (INDEPENDENT_AMBULATORY_CARE_PROVIDER_SITE_OTHER)
Admission: RE | Admit: 2011-03-25 | Discharge: 2011-03-25 | Disposition: A | Discharge status: Home / Self Care | Payer: Medicare Other | Source: Ambulatory Visit

## 2011-03-25 ENCOUNTER — Encounter: Payer: Self-pay | Admitting: Family

## 2011-03-25 DIAGNOSIS — Z13820 Encounter for screening for osteoporosis: Secondary | ICD-10-CM

## 2011-03-26 ENCOUNTER — Encounter: Payer: Self-pay | Admitting: Family

## 2011-03-28 ENCOUNTER — Other Ambulatory Visit: Payer: Self-pay | Admitting: Internal Medicine

## 2011-03-28 LAB — HEPATIC FUNCTION PANEL
ALT: 28 U/L (ref 0–35)
AST: 28 U/L (ref 0–37)
Albumin: 4.3 g/dL (ref 3.5–5.2)
Total Bilirubin: 0.6 mg/dL (ref 0.3–1.2)

## 2011-03-28 LAB — LIPID PANEL
Cholesterol: 172 mg/dL (ref 0–200)
HDL: 44 mg/dL (ref >39)
Total CHOL/HDL Ratio: 3.9 Ratio

## 2011-03-29 LAB — BASIC METABOLIC PANEL WITH GFR
BUN: 15 mg/dL (ref 6–23)
Chloride: 104 mEq/L (ref 96–112)
GFR, Est Non African American: >60 mL/min (ref >60)
Potassium: 4.7 mEq/L (ref 3.5–5.3)

## 2011-04-01 ENCOUNTER — Telehealth: Payer: Self-pay | Admitting: Family

## 2011-04-01 DIAGNOSIS — R739 Hyperglycemia, unspecified: Secondary | ICD-10-CM

## 2011-04-01 NOTE — Telephone Encounter (Signed)
Left message for pt to return our call. When she calls back I would like to let her know that her sugar is slightly elevated. I would like for her to return to the lab for an A1C which will let us know how her sugar has been averaging over the last few months. Her pap smear is normal.

## 2011-04-01 NOTE — Telephone Encounter (Signed)
Pt returned my call.  I recommended that she return to the lab for A1C. She is agreeable.  Other labs/pap results reviewed with patient.

## 2011-04-02 NOTE — Telephone Encounter (Signed)
Order faxed to the lab.

## 2011-04-03 LAB — HEMOGLOBIN A1C: Hgb A1c MFr Bld: 5.8 % — ABNORMAL HIGH (ref <5.7)

## 2011-04-04 ENCOUNTER — Encounter: Payer: Self-pay | Admitting: Family

## 2011-04-04 DIAGNOSIS — R739 Hyperglycemia, unspecified: Secondary | ICD-10-CM | POA: Insufficient documentation

## 2011-04-13 ENCOUNTER — Encounter: Payer: Self-pay | Admitting: Family

## 2011-04-13 ENCOUNTER — Telehealth: Payer: Self-pay | Admitting: Family

## 2011-04-13 DIAGNOSIS — Z8269 Family history of other diseases of the musculoskeletal system and connective tissue: Secondary | ICD-10-CM

## 2011-04-13 DIAGNOSIS — M8580 Other specified disorders of bone density and structure, unspecified site: Secondary | ICD-10-CM

## 2011-04-13 MED ORDER — CALCIUM CARBONATE-VITAMIN D 600-400 MG-UNIT PO TABS: 1.0000 | ORAL_TABLET | Freq: Two times a day (BID) | ORAL | Status: DC

## 2011-04-13 NOTE — Telephone Encounter (Signed)
Pls call pt and let her know that her bone density shows mild bone thinning.  I would like for her to add Caltrate 639m +D BID and also complete a vitamin D level in the lab.  She should plan to repeat bone density test in 2 yrs.

## 2011-04-14 NOTE — Telephone Encounter (Signed)
Pt notified and will return to the lab on Wednesday for vit D level. Order entered and forwarded to the lab.

## 2011-04-17 ENCOUNTER — Encounter: Payer: Self-pay | Admitting: Family

## 2011-05-08 ENCOUNTER — Ambulatory Visit (INDEPENDENT_AMBULATORY_CARE_PROVIDER_SITE_OTHER): Payer: Medicare Other | Admitting: Internal Medicine

## 2011-05-08 DIAGNOSIS — Z23 Encounter for immunization: Secondary | ICD-10-CM

## 2011-05-09 LAB — URINALYSIS, ROUTINE W REFLEX MICROSCOPIC
Glucose, UA: NEGATIVE mg/dL
Hgb urine dipstick: NEGATIVE
Protein, ur: NEGATIVE mg/dL
Specific Gravity, Urine: 1.016 (ref 1.005–1.030)
pH: 7 (ref 5.0–8.0)

## 2011-05-09 LAB — URINE MICROSCOPIC-ADD ON

## 2011-06-14 ENCOUNTER — Other Ambulatory Visit: Payer: Self-pay | Admitting: Family

## 2011-06-18 ENCOUNTER — Ambulatory Visit (INDEPENDENT_AMBULATORY_CARE_PROVIDER_SITE_OTHER): Payer: Medicare Other | Admitting: Internal Medicine

## 2011-06-18 ENCOUNTER — Encounter: Payer: Self-pay | Admitting: Internal Medicine

## 2011-06-18 ENCOUNTER — Telehealth: Payer: Self-pay | Admitting: Internal Medicine

## 2011-06-18 ENCOUNTER — Ambulatory Visit: Payer: Medicare Other | Admitting: Family

## 2011-06-18 DIAGNOSIS — E785 Hyperlipidemia, unspecified: Secondary | ICD-10-CM

## 2011-06-18 DIAGNOSIS — J069 Acute upper respiratory infection, unspecified: Secondary | ICD-10-CM

## 2011-06-18 DIAGNOSIS — I10 Essential (primary) hypertension: Secondary | ICD-10-CM

## 2011-06-18 DIAGNOSIS — Z79899 Other long term (current) drug therapy: Secondary | ICD-10-CM

## 2011-06-18 DIAGNOSIS — E039 Hypothyroidism, unspecified: Secondary | ICD-10-CM

## 2011-06-18 DIAGNOSIS — Z23 Encounter for immunization: Secondary | ICD-10-CM

## 2011-06-18 MED ORDER — SIMVASTATIN 20 MG PO TABS: 20.0000 mg | ORAL_TABLET | Freq: Every day | ORAL | Status: DC

## 2011-06-18 MED ORDER — DOXYCYCLINE HYCLATE 100 MG PO TABS: 100.0000 mg | ORAL_TABLET | Freq: Two times a day (BID) | ORAL | Status: AC

## 2011-06-18 NOTE — Patient Instructions (Signed)
Please schedule lipid/lft 272.4,  chem7 v58.69 and tsh (hypothyroidism) prior to next visit

## 2011-06-18 NOTE — Assessment & Plan Note (Signed)
Normotensive and stable. Continue current regimen. Monitor bp as outpt and followup in clinic as scheduled.

## 2011-06-18 NOTE — Assessment & Plan Note (Signed)
Change vytorin to zocor. Obtain lipid/lft prior to next visit

## 2011-06-18 NOTE — Assessment & Plan Note (Signed)
Given abx prescription to hold. Begin if sx's do not improve after total duration of 7-10d. Followup if no improvement or worsening.

## 2011-06-18 NOTE — Progress Notes (Signed)
  Subjective:    Patient ID: Kimberly Irwin, female    DOB: [birth year], 65 y.o.   MRN: [id number]  HPI Pt presents to clinic for followup of multiple medical problems. Notes 2d h/o nasal drainage, myalgias and np cough. No f/c. Tolerates statin tx without adverse effect but finds vytorin expensive. Pap/mammogram now utd. Denies having had pneumovax and denies tetanus within past 10y. No other complaints.  Past Medical History  Diagnosis Date  . Sarcoidosis   . Hyperlipidemia   . GERD (gastroesophageal reflux disease)   . Hypertension   . Thyroid disease     hypothyroidism  . Chronic low back pain   . DDD (degenerative disc disease)     L3-4 with facet arthropathy and stenosis  . Degenerative spondylolisthesis     L4-5 grade 1 with stenosis  . Hyperglycemia 04/04/2011   Past Surgical History  Procedure Date  . Carpal tunnel release   . Thyroidectomy   . Back surgery 01/15/09    L3-4 and L4-5 decompressive laminectomy with bilateral L3, L4, and L5 decompressive foraminotomies, more than it would be required for simple interbody fusion alone.  . Back surgery 01/15/09    L3-4 and L4-5 posterior lumbar interbody fusion utilizing tanget interbody allograft wedge, Telamon interbody PEEk cage, and local autografting.  . Back surgery 01/15/09    L3, L4, and L5 posterolateral arthrodesis using segmental pedicle screw fixation and local autografting.  . Joint replacement 09/13/09    Right Hip, Dr. Alvan Dame    reports that she has quit smoking. She has never used smokeless tobacco. She reports that she does not drink alcohol. Her drug history not on file. family history includes Cancer (age of onset:60) in her mother; Heart attack in her mother; Heart disease in her mother; Hyperlipidemia in her mother; and Hypertension in her mother. Allergies  Allergen Reactions  . Metronidazole   . Penicillins   . Pregabalin     REACTION: Somnolence and dizziness  . Rosuvastatin      Review of Systems see  hpi     Objective:   Physical Exam        Assessment & Plan:

## 2011-06-19 NOTE — Telephone Encounter (Signed)
Lab orders entered for March 2013.

## 2011-07-04 ENCOUNTER — Telehealth: Payer: Self-pay | Admitting: Internal Medicine

## 2011-07-04 MED ORDER — LEVOTHYROXINE SODIUM 100 MCG PO TABS: 100.0000 ug | ORAL_TABLET | Freq: Every day | ORAL | Status: DC

## 2011-07-04 NOTE — Telephone Encounter (Signed)
Refill-levothyroxin 189mg tab. Take one tablet by mouth every day. Qty 90 last fill 8.24.12

## 2011-07-04 NOTE — Telephone Encounter (Signed)
Rx refill sent to pharmacy. 

## 2011-07-21 ENCOUNTER — Other Ambulatory Visit: Payer: Self-pay | Admitting: Family

## 2011-09-12 ENCOUNTER — Telehealth: Payer: Self-pay | Admitting: Internal Medicine

## 2011-09-12 NOTE — Telephone Encounter (Signed)
Records request   EMSI   Sent to [facility] to [facility]

## 2011-10-07 ENCOUNTER — Other Ambulatory Visit: Payer: Self-pay | Admitting: *Deleted

## 2011-10-07 DIAGNOSIS — Z79899 Other long term (current) drug therapy: Secondary | ICD-10-CM | POA: Diagnosis not present

## 2011-10-07 DIAGNOSIS — E039 Hypothyroidism, unspecified: Secondary | ICD-10-CM | POA: Diagnosis not present

## 2011-10-07 DIAGNOSIS — E785 Hyperlipidemia, unspecified: Secondary | ICD-10-CM | POA: Diagnosis not present

## 2011-10-07 LAB — BASIC METABOLIC PANEL
BUN: 14 mg/dL (ref 6–23)
CO2: 26 mEq/L (ref 19–32)
Calcium: 10.2 mg/dL (ref 8.4–10.5)
Glucose, Bld: 102 mg/dL — ABNORMAL HIGH (ref 70–99)

## 2011-10-07 LAB — LIPID PANEL: Cholesterol: 225 mg/dL — ABNORMAL HIGH (ref 0–200)

## 2011-10-07 LAB — HEPATIC FUNCTION PANEL
ALT: 29 U/L (ref 0–35)
AST: 27 U/L (ref 0–37)
Alkaline Phosphatase: 49 U/L (ref 39–117)
Bilirubin, Direct: 0.2 mg/dL (ref 0.0–0.3)
Indirect Bilirubin: 0.5 mg/dL (ref 0.0–0.9)

## 2011-10-07 LAB — TSH: TSH: 1.628 u[IU]/mL (ref 0.350–4.500)

## 2011-10-15 ENCOUNTER — Telehealth: Payer: Self-pay | Admitting: Internal Medicine

## 2011-10-15 ENCOUNTER — Ambulatory Visit (INDEPENDENT_AMBULATORY_CARE_PROVIDER_SITE_OTHER): Payer: Medicare Other | Admitting: Internal Medicine

## 2011-10-15 ENCOUNTER — Encounter: Payer: Self-pay | Admitting: Internal Medicine

## 2011-10-15 VITALS — BP 116/80 | HR 72 | Temp 98.8°F | Resp 18 | Ht 64.0 in | Wt 187.0 lb

## 2011-10-15 DIAGNOSIS — J069 Acute upper respiratory infection, unspecified: Secondary | ICD-10-CM

## 2011-10-15 DIAGNOSIS — I10 Essential (primary) hypertension: Secondary | ICD-10-CM

## 2011-10-15 DIAGNOSIS — E785 Hyperlipidemia, unspecified: Secondary | ICD-10-CM

## 2011-10-15 MED ORDER — HYDROCOD POLST-CHLORPHEN POLST 10-8 MG/5ML PO LQCR: 5.0000 mL | Freq: Two times a day (BID) | ORAL | Status: DC | PRN

## 2011-10-15 MED ORDER — ALBUTEROL SULFATE HFA 108 (90 BASE) MCG/ACT IN AERS: 2.0000 | INHALATION_SPRAY | Freq: Four times a day (QID) | RESPIRATORY_TRACT | Status: DC | PRN

## 2011-10-15 MED ORDER — SIMVASTATIN 40 MG PO TABS: 40.0000 mg | ORAL_TABLET | Freq: Every day | ORAL | Status: DC

## 2011-10-15 MED ORDER — FUROSEMIDE 20 MG PO TABS: ORAL_TABLET | ORAL | Status: DC

## 2011-10-15 NOTE — Telephone Encounter (Signed)
Lab order entered for June 2013.

## 2011-10-15 NOTE — Assessment & Plan Note (Signed)
Normotensive and stable. Continue current regimen. Monitor bp as outpt and followup in clinic as scheduled.  

## 2011-10-15 NOTE — Assessment & Plan Note (Signed)
Suspect current viral etiology however monitor sx's and duration carefully for need of abx. Followup if no improvement or worsening.

## 2011-10-15 NOTE — Patient Instructions (Signed)
Please schedule fasting labs prior to your next visit: lipid/lft 272.4

## 2011-10-15 NOTE — Progress Notes (Signed)
  Subjective:    Patient ID: Kimberly Irwin, female    DOB: 09/25/1945, 66 y.o.   MRN: [id number]  HPI Pt presents to clinic for followup of multiple medical problems. Notes one day h/o np cough wit fever tmax 101 without chills. No dyspnea, hemoptysis or wheezing. Changed from vytorin to zocor and tolerating well. ldl reviewed high. Notes intermittent mild bilateral hand and feet fluid retention.   Past Medical History  Diagnosis Date  . Sarcoidosis   . Hyperlipidemia   . GERD (gastroesophageal reflux disease)   . Hypertension   . Thyroid disease     hypothyroidism  . Chronic low back pain   . DDD (degenerative disc disease)     L3-4 with facet arthropathy and stenosis  . Degenerative spondylolisthesis     L4-5 grade 1 with stenosis  . Hyperglycemia 04/04/2011   Past Surgical History  Procedure Date  . Carpal tunnel release   . Thyroidectomy   . Back surgery 01/15/09    L3-4 and L4-5 decompressive laminectomy with bilateral L3, L4, and L5 decompressive foraminotomies, more than it would be required for simple interbody fusion alone.  . Back surgery 01/15/09    L3-4 and L4-5 posterior lumbar interbody fusion utilizing tanget interbody allograft wedge, Telamon interbody PEEk cage, and local autografting.  . Back surgery 01/15/09    L3, L4, and L5 posterolateral arthrodesis using segmental pedicle screw fixation and local autografting.  . Joint replacement 09/13/09    Right Hip, Dr. Alvan Dame    reports that she has quit smoking. She has never used smokeless tobacco. She reports that she does not drink alcohol. Her drug history not on file. family history includes Cancer (age of onset:60) in her mother; Heart attack in her mother; Heart disease in her mother; Hyperlipidemia in her mother; and Hypertension in her mother. Allergies  Allergen Reactions  . Metronidazole   . Penicillins   . Pregabalin     REACTION: Somnolence and dizziness  . Rosuvastatin       Review of Systems see hpi   Objective:   Physical Exam  Physical Exam  Nursing note and vitals reviewed. Constitutional: Appears well-developed and well-nourished. No distress.  HENT:  Head: Normocephalic and atraumatic.  Right Ear: External ear normal.  Left Ear: External ear normal.  Eyes: Conjunctivae are normal. No scleral icterus.  Neck: Neck supple. Carotid bruit is not present.  Cardiovascular: Normal rate, regular rhythm and normal heart sounds.  Exam reveals no gallop and no friction rub.   No murmur heard. Pulmonary/Chest: Effort normal and breath sounds normal. No respiratory distress. He has no wheezes. no rales.  Lymphadenopathy:    He has no cervical adenopathy.  Neurological:Alert.  Skin: Skin is warm and dry. Not diaphoretic.  Psychiatric: Has a normal mood and affect.         Assessment & Plan:

## 2011-10-15 NOTE — Assessment & Plan Note (Signed)
suboptimal control. Increase zocor 2m qd.

## 2011-11-28 ENCOUNTER — Other Ambulatory Visit: Payer: Self-pay | Admitting: Family Medicine

## 2011-12-10 ENCOUNTER — Telehealth: Payer: Self-pay | Admitting: Internal Medicine

## 2011-12-10 MED ORDER — HYDROCHLOROTHIAZIDE 25 MG PO TABS: 25.0000 mg | ORAL_TABLET | Freq: Every day | ORAL | Status: DC

## 2011-12-10 NOTE — Telephone Encounter (Signed)
Refill-hydrochlorothiazide 16m tab. Take one tablet by mouth every day. Qty 30 last fill 4.5.13

## 2011-12-31 ENCOUNTER — Telehealth: Payer: Self-pay | Admitting: Internal Medicine

## 2011-12-31 MED ORDER — LEVOTHYROXINE SODIUM 100 MCG PO TABS: 100.0000 ug | ORAL_TABLET | Freq: Every day | ORAL | Status: DC

## 2011-12-31 NOTE — Telephone Encounter (Signed)
Refill-synthroid 145mg tab. Take one tablet by mouth every day. Qty 90 last fill 2.22.13

## 2011-12-31 NOTE — Telephone Encounter (Signed)
Rx refill sent to pharmacy. 

## 2012-01-07 ENCOUNTER — Other Ambulatory Visit: Payer: Self-pay | Admitting: *Deleted

## 2012-01-07 DIAGNOSIS — E785 Hyperlipidemia, unspecified: Secondary | ICD-10-CM | POA: Diagnosis not present

## 2012-01-07 LAB — HEPATIC FUNCTION PANEL
AST: 37 U/L (ref 0–37)
Alkaline Phosphatase: 53 U/L (ref 39–117)
Bilirubin, Direct: 0.1 mg/dL (ref 0.0–0.3)
Total Bilirubin: 0.7 mg/dL (ref 0.3–1.2)

## 2012-01-07 LAB — LIPID PANEL: LDL Cholesterol: 111 mg/dL — ABNORMAL HIGH (ref 0–99)

## 2012-01-16 ENCOUNTER — Telehealth: Payer: Self-pay | Admitting: Internal Medicine

## 2012-01-16 ENCOUNTER — Ambulatory Visit (HOSPITAL_BASED_OUTPATIENT_CLINIC_OR_DEPARTMENT_OTHER)
Admission: RE | Admit: 2012-01-16 | Discharge: 2012-01-16 | Disposition: A | Discharge status: Home / Self Care | Payer: Medicare Other | Source: Ambulatory Visit | Attending: Internal Medicine | Admitting: Internal Medicine

## 2012-01-16 ENCOUNTER — Encounter: Payer: Self-pay | Admitting: Internal Medicine

## 2012-01-16 ENCOUNTER — Ambulatory Visit (INDEPENDENT_AMBULATORY_CARE_PROVIDER_SITE_OTHER): Payer: Medicare Other | Admitting: Internal Medicine

## 2012-01-16 VITALS — BP 112/72 | HR 70 | Temp 98.0°F | Resp 20 | Ht 64.0 in | Wt 187.0 lb

## 2012-01-16 DIAGNOSIS — M1610 Unilateral primary osteoarthritis, unspecified hip: Secondary | ICD-10-CM | POA: Insufficient documentation

## 2012-01-16 DIAGNOSIS — M25559 Pain in unspecified hip: Secondary | ICD-10-CM

## 2012-01-16 DIAGNOSIS — I10 Essential (primary) hypertension: Secondary | ICD-10-CM | POA: Diagnosis not present

## 2012-01-16 DIAGNOSIS — M169 Osteoarthritis of hip, unspecified: Secondary | ICD-10-CM | POA: Diagnosis not present

## 2012-01-16 DIAGNOSIS — E039 Hypothyroidism, unspecified: Secondary | ICD-10-CM

## 2012-01-16 DIAGNOSIS — M549 Dorsalgia, unspecified: Secondary | ICD-10-CM

## 2012-01-16 DIAGNOSIS — Z96649 Presence of unspecified artificial hip joint: Secondary | ICD-10-CM | POA: Diagnosis not present

## 2012-01-16 DIAGNOSIS — Z79899 Other long term (current) drug therapy: Secondary | ICD-10-CM

## 2012-01-16 DIAGNOSIS — E785 Hyperlipidemia, unspecified: Secondary | ICD-10-CM | POA: Diagnosis not present

## 2012-01-16 NOTE — Assessment & Plan Note (Signed)
Obtain plain hip radiograph

## 2012-01-16 NOTE — Progress Notes (Signed)
  Subjective:    Patient ID: Kimberly Irwin, female    DOB: 06/26/1946, 66 y.o.   MRN: [id number]  HPI Pt presents to clinic for followup of multiple medical problems. Notes chronic right low back pain worsening s/p back surgery 2010. Has attempted conservative measures without improvement. Has daily pain and medication not helping at this point. Also has intermittent right hip pain s/p hip replacement surgery. No recent injury/trauma. Reviewed improved cholesterol. Requests otc weight loss medication recommendation.  Past Medical History  Diagnosis Date  . Sarcoidosis   . Hyperlipidemia   . GERD (gastroesophageal reflux disease)   . Hypertension   . Thyroid disease     hypothyroidism  . Chronic low back pain   . DDD (degenerative disc disease)     L3-4 with facet arthropathy and stenosis  . Degenerative spondylolisthesis     L4-5 grade 1 with stenosis  . Hyperglycemia 04/04/2011   Past Surgical History  Procedure Date  . Carpal tunnel release   . Thyroidectomy   . Back surgery 01/15/09    L3-4 and L4-5 decompressive laminectomy with bilateral L3, L4, and L5 decompressive foraminotomies, more than it would be required for simple interbody fusion alone.  . Back surgery 01/15/09    L3-4 and L4-5 posterior lumbar interbody fusion utilizing tanget interbody allograft wedge, Telamon interbody PEEk cage, and local autografting.  . Back surgery 01/15/09    L3, L4, and L5 posterolateral arthrodesis using segmental pedicle screw fixation and local autografting.  . Joint replacement 09/13/09    Right Hip, Dr. Alvan Dame    reports that she has quit smoking. She has never used smokeless tobacco. She reports that she does not drink alcohol. Her drug history not on file. family history includes Cancer (age of onset:60) in her mother; Heart attack in her mother; Heart disease in her mother; Hyperlipidemia in her mother; and Hypertension in her mother. Allergies  Allergen Reactions  . Metronidazole   .  Penicillins   . Pregabalin     REACTION: Somnolence and dizziness  . Rosuvastatin       Review of Systems see hpi     Objective:   Physical Exam  Physical Exam  Nursing note and vitals reviewed. Constitutional: Appears well-developed and well-nourished. No distress.  HENT:  Head: Normocephalic and atraumatic.  Right Ear: External ear normal.  Left Ear: External ear normal.  Eyes: Conjunctivae are normal. No scleral icterus.  Neck: Neck supple. Carotid bruit is not present.  Cardiovascular: Normal rate, regular rhythm and normal heart sounds.  Exam reveals no gallop and no friction rub.   No murmur heard. Pulmonary/Chest: Effort normal and breath sounds normal. No respiratory distress. He has no wheezes. no rales.  Lymphadenopathy:    He has no cervical adenopathy.  Neurological:Alert.  Skin: Skin is warm and dry. Not diaphoretic.  Psychiatric: Has a normal mood and affect.  MSK: no midline ls tenderness or bony abn.       Assessment & Plan:

## 2012-01-16 NOTE — Assessment & Plan Note (Signed)
Schedule f/u with surgery.

## 2012-01-16 NOTE — Telephone Encounter (Signed)
Lab order entered for October 2013.

## 2012-01-16 NOTE — Assessment & Plan Note (Signed)
Improving. Continue current statin dose

## 2012-01-16 NOTE — Assessment & Plan Note (Signed)
Normotensive and stable. Continue current regimen. Monitor bp as outpt and followup in clinic as scheduled.  

## 2012-01-16 NOTE — Patient Instructions (Signed)
Several over the counter weight loss medications can affect your blood pressure, heart rate or heart. Alli should not. Please schedule labs prior to next visit chem7-v58.69 and tsh/free t4-hypothyroidism

## 2012-02-06 ENCOUNTER — Other Ambulatory Visit: Payer: Self-pay | Admitting: Family Medicine

## 2012-02-26 ENCOUNTER — Other Ambulatory Visit: Payer: Self-pay | Admitting: Neurosurgery

## 2012-02-26 DIAGNOSIS — M545 Low back pain, unspecified: Secondary | ICD-10-CM

## 2012-02-26 DIAGNOSIS — IMO0002 Reserved for concepts with insufficient information to code with codable children: Secondary | ICD-10-CM | POA: Diagnosis not present

## 2012-03-01 ENCOUNTER — Other Ambulatory Visit: Payer: Self-pay | Admitting: Family Medicine

## 2012-03-02 ENCOUNTER — Telehealth: Payer: Self-pay | Admitting: Internal Medicine

## 2012-03-02 NOTE — Telephone Encounter (Signed)
Refill previously sent to pharmacy on 03/01/12 #30 x 2 refills.

## 2012-03-02 NOTE — Telephone Encounter (Signed)
Zebeta 5 mg   #  30 take one tablet by mouth every day    ,last refill    01/23/12

## 2012-03-03 ENCOUNTER — Ambulatory Visit
Admission: RE | Admit: 2012-03-03 | Discharge: 2012-03-03 | Disposition: A | Discharge status: Home / Self Care | Payer: Medicare Other | Source: Ambulatory Visit | Attending: Neurosurgery | Admitting: Neurosurgery

## 2012-03-03 DIAGNOSIS — M5137 Other intervertebral disc degeneration, lumbosacral region: Secondary | ICD-10-CM | POA: Diagnosis not present

## 2012-03-03 DIAGNOSIS — M545 Low back pain, unspecified: Secondary | ICD-10-CM

## 2012-03-03 DIAGNOSIS — M5126 Other intervertebral disc displacement, lumbar region: Secondary | ICD-10-CM | POA: Diagnosis not present

## 2012-03-03 DIAGNOSIS — M47817 Spondylosis without myelopathy or radiculopathy, lumbosacral region: Secondary | ICD-10-CM | POA: Diagnosis not present

## 2012-03-03 MED ORDER — GADOBENATE DIMEGLUMINE 529 MG/ML IV SOLN
18.0000 mL | Freq: Once | INTRAVENOUS | Status: AC | PRN
  Administered 2012-03-03: 18 mL via INTRAVENOUS

## 2012-04-01 DIAGNOSIS — IMO0002 Reserved for concepts with insufficient information to code with codable children: Secondary | ICD-10-CM | POA: Diagnosis not present

## 2012-04-07 ENCOUNTER — Telehealth: Payer: Self-pay | Admitting: Internal Medicine

## 2012-04-07 MED ORDER — HYDROCHLOROTHIAZIDE 25 MG PO TABS: 25.0000 mg | ORAL_TABLET | Freq: Every day | ORAL | Status: DC

## 2012-04-07 NOTE — Telephone Encounter (Signed)
Refill-hydrochlorothiazide 59m tab. Take one tablet by mouth every day. Qty 30 last fill 8.8.13

## 2012-04-07 NOTE — Telephone Encounter (Signed)
Rx done/SLS

## 2012-05-12 DIAGNOSIS — Z79899 Other long term (current) drug therapy: Secondary | ICD-10-CM | POA: Diagnosis not present

## 2012-05-12 DIAGNOSIS — E039 Hypothyroidism, unspecified: Secondary | ICD-10-CM | POA: Diagnosis not present

## 2012-05-12 LAB — BASIC METABOLIC PANEL
BUN: 15 mg/dL (ref 6–23)
Calcium: 10 mg/dL (ref 8.4–10.5)
Creat: 0.93 mg/dL (ref 0.50–1.10)
Glucose, Bld: 115 mg/dL — ABNORMAL HIGH (ref 70–99)

## 2012-05-12 LAB — T4, FREE: Free T4: 1.23 ng/dL (ref 0.80–1.80)

## 2012-05-12 LAB — TSH: TSH: 4.555 u[IU]/mL — ABNORMAL HIGH (ref 0.350–4.500)

## 2012-05-12 NOTE — Addendum Note (Signed)
Addended by: Lyman Bishop on: 05/12/2012 10:57 AM   Modules accepted: Orders

## 2012-05-18 DIAGNOSIS — Z1231 Encounter for screening mammogram for malignant neoplasm of breast: Secondary | ICD-10-CM | POA: Diagnosis not present

## 2012-05-21 ENCOUNTER — Ambulatory Visit: Payer: Medicare Other | Admitting: Internal Medicine

## 2012-05-26 ENCOUNTER — Telehealth: Payer: Self-pay | Admitting: Internal Medicine

## 2012-05-26 ENCOUNTER — Encounter: Payer: Self-pay | Admitting: Internal Medicine

## 2012-05-26 ENCOUNTER — Ambulatory Visit (INDEPENDENT_AMBULATORY_CARE_PROVIDER_SITE_OTHER): Payer: Medicare Other | Admitting: Internal Medicine

## 2012-05-26 VITALS — BP 132/82 | HR 69 | Temp 98.4°F | Resp 16 | Wt 189.2 lb

## 2012-05-26 DIAGNOSIS — L219 Seborrheic dermatitis, unspecified: Secondary | ICD-10-CM

## 2012-05-26 DIAGNOSIS — I10 Essential (primary) hypertension: Secondary | ICD-10-CM | POA: Diagnosis not present

## 2012-05-26 DIAGNOSIS — R739 Hyperglycemia, unspecified: Secondary | ICD-10-CM

## 2012-05-26 DIAGNOSIS — E039 Hypothyroidism, unspecified: Secondary | ICD-10-CM

## 2012-05-26 DIAGNOSIS — Z23 Encounter for immunization: Secondary | ICD-10-CM

## 2012-05-26 DIAGNOSIS — R7309 Other abnormal glucose: Secondary | ICD-10-CM

## 2012-05-26 MED ORDER — SELENIUM SULFIDE 2.5 % EX LOTN: TOPICAL_LOTION | Freq: Two times a day (BID) | CUTANEOUS | Status: DC

## 2012-05-26 MED ORDER — BISOPROLOL FUMARATE 5 MG PO TABS: ORAL_TABLET | ORAL | Status: DC

## 2012-05-26 MED ORDER — SIMVASTATIN 40 MG PO TABS: 40.0000 mg | ORAL_TABLET | Freq: Every day | ORAL | Status: DC

## 2012-05-26 NOTE — Assessment & Plan Note (Signed)
Normotensive and stable. Continue current regimen. Monitor bp as outpt and followup in clinic as scheduled.  

## 2012-05-26 NOTE — Progress Notes (Signed)
  Subjective:    Patient ID: Kimberly Irwin, female    DOB: [birth year], 66 y.o.   MRN: [id number]  HPI Pt presents to clinic for followup of multiple medical problems. Has continued chronic lumbar back pain status post surgery. Currently following the surgery who has recommended what appears to be epidural injection. Patient currently considering. Reviewed slightly elevated TSH with normal free T4. Complains of intermittent red and flaky skin of the face in the T zone. Uses hydrocortisone when necessary.  Past Medical History  Diagnosis Date  . Sarcoidosis   . Hyperlipidemia   . GERD (gastroesophageal reflux disease)   . Hypertension   . Thyroid disease     hypothyroidism  . Chronic low back pain   . DDD (degenerative disc disease)     L3-4 with facet arthropathy and stenosis  . Degenerative spondylolisthesis     L4-5 grade 1 with stenosis  . Hyperglycemia 04/04/2011   Past Surgical History  Procedure Date  . Carpal tunnel release   . Thyroidectomy   . Back surgery 01/15/09    L3-4 and L4-5 decompressive laminectomy with bilateral L3, L4, and L5 decompressive foraminotomies, more than it would be required for simple interbody fusion alone.  . Back surgery 01/15/09    L3-4 and L4-5 posterior lumbar interbody fusion utilizing tanget interbody allograft wedge, Telamon interbody PEEk cage, and local autografting.  . Back surgery 01/15/09    L3, L4, and L5 posterolateral arthrodesis using segmental pedicle screw fixation and local autografting.  . Joint replacement 09/13/09    Right Hip, Dr. Alvan Dame    reports that she has quit smoking. She has never used smokeless tobacco. She reports that she does not drink alcohol. Her drug history not on file. family history includes Cancer (age of onset:60) in her mother; Heart attack in her mother; Heart disease in her mother; Hyperlipidemia in her mother; and Hypertension in her mother. Allergies  Allergen Reactions  . Metronidazole   . Penicillins   .  Pregabalin     REACTION: Somnolence and dizziness  . Rosuvastatin       Review of Systems see hpi     Objective:   Physical Exam  Nursing note and vitals reviewed. Constitutional: She appears well-developed and well-nourished. No distress.  HENT:  Head: Normocephalic and atraumatic.  Eyes: Conjunctivae normal are normal. No scleral icterus.  Neck: Neck supple.  Cardiovascular: Normal rate, regular rhythm and normal heart sounds.  Exam reveals no gallop and no friction rub.   No murmur heard. Neurological: She is alert.  Skin: She is not diaphoretic.  Psychiatric: She has a normal mood and affect.          Assessment & Plan:

## 2012-05-26 NOTE — Assessment & Plan Note (Signed)
Attempt selenium sulfide 2.5% twice a day.

## 2012-05-26 NOTE — Assessment & Plan Note (Signed)
Encouraged low sugar low carbohydrate diet and weight loss

## 2012-05-26 NOTE — Assessment & Plan Note (Signed)
Mildly elevated TSH. Asymptomatic. Take one half extra pill one day a week. Recheck TSH and free T4 in approximately 8-10 weeks

## 2012-05-26 NOTE — Patient Instructions (Signed)
Please schedule non fasting labs in 8-10 weeks Tsh/free t4-hypothyroidism and vit d -vit d deficiency

## 2012-05-26 NOTE — Telephone Encounter (Signed)
Lab order week of 07-19-2012 Tsh/free t4-hypothyroidism and vit d -vit d deficiency

## 2012-05-27 ENCOUNTER — Other Ambulatory Visit: Payer: Self-pay | Admitting: *Deleted

## 2012-05-27 MED ORDER — KETOCONAZOLE 2 % EX CREA: TOPICAL_CREAM | Freq: Every day | CUTANEOUS | Status: DC

## 2012-05-27 NOTE — Progress Notes (Signed)
Insurance will no longer cover Selsun; alternative Rx requested/SLS

## 2012-06-07 DIAGNOSIS — M47817 Spondylosis without myelopathy or radiculopathy, lumbosacral region: Secondary | ICD-10-CM | POA: Diagnosis not present

## 2012-06-07 DIAGNOSIS — M48061 Spinal stenosis, lumbar region without neurogenic claudication: Secondary | ICD-10-CM | POA: Diagnosis not present

## 2012-06-07 DIAGNOSIS — IMO0002 Reserved for concepts with insufficient information to code with codable children: Secondary | ICD-10-CM | POA: Diagnosis not present

## 2012-06-15 ENCOUNTER — Encounter: Payer: Self-pay | Admitting: Internal Medicine

## 2012-06-16 DIAGNOSIS — M47817 Spondylosis without myelopathy or radiculopathy, lumbosacral region: Secondary | ICD-10-CM | POA: Diagnosis not present

## 2012-06-21 ENCOUNTER — Telehealth: Payer: Self-pay | Admitting: Internal Medicine

## 2012-06-21 MED ORDER — LEVOTHYROXINE SODIUM 100 MCG PO TABS: 100.0000 ug | ORAL_TABLET | Freq: Every day | ORAL | Status: DC

## 2012-06-21 NOTE — Telephone Encounter (Signed)
Rx to pharmacy/SLS 

## 2012-06-21 NOTE — Telephone Encounter (Signed)
Refill-levothyroxine 147mg tab. Take one tablet by mouth every day. Qty 90 last fill 8.23.13

## 2012-06-29 DIAGNOSIS — M48061 Spinal stenosis, lumbar region without neurogenic claudication: Secondary | ICD-10-CM | POA: Diagnosis not present

## 2012-06-29 DIAGNOSIS — IMO0002 Reserved for concepts with insufficient information to code with codable children: Secondary | ICD-10-CM | POA: Diagnosis not present

## 2012-07-14 DIAGNOSIS — M48061 Spinal stenosis, lumbar region without neurogenic claudication: Secondary | ICD-10-CM | POA: Diagnosis not present

## 2012-07-14 DIAGNOSIS — IMO0002 Reserved for concepts with insufficient information to code with codable children: Secondary | ICD-10-CM | POA: Diagnosis not present

## 2012-07-21 ENCOUNTER — Telehealth: Payer: Self-pay | Admitting: *Deleted

## 2012-07-21 DIAGNOSIS — E039 Hypothyroidism, unspecified: Secondary | ICD-10-CM

## 2012-07-21 DIAGNOSIS — E559 Vitamin D deficiency, unspecified: Secondary | ICD-10-CM

## 2012-07-21 LAB — T4, FREE: Free T4: 1.37 ng/dL (ref 0.80–1.80)

## 2012-07-21 NOTE — Telephone Encounter (Signed)
Pt presented to the lab per 05/26/12 office note as below:  Please schedule non fasting labs in 8-10 weeks  Tsh/free t4-hypothyroidism and vit d -vit d deficiency

## 2012-07-22 LAB — VITAMIN D 25 HYDROXY (VIT D DEFICIENCY, FRACTURES): Vit D, 25-Hydroxy: 44 ng/mL (ref 30–89)

## 2012-08-06 ENCOUNTER — Telehealth: Payer: Self-pay | Admitting: *Deleted

## 2012-08-06 ENCOUNTER — Telehealth: Payer: Self-pay | Admitting: Internal Medicine

## 2012-08-06 MED ORDER — HYDROCHLOROTHIAZIDE 25 MG PO TABS: 25.0000 mg | ORAL_TABLET | Freq: Every day | ORAL | Status: DC

## 2012-08-06 NOTE — Telephone Encounter (Signed)
Message copied by Rockwell Germany on Fri Aug 06, 2012  5:26 PM ------      Message from: Burnice Logan      Created: Wed Aug 04, 2012 12:53 PM       Thyroid still slightly underactive. When out of current prescription change dose to 112 mcg qd.

## 2012-08-06 NOTE — Telephone Encounter (Signed)
Refill- hydrochlorothiazide 60m tab. Take one tablet by mouth every day. Qty 30 last fill 12.6.13

## 2012-08-06 NOTE — Telephone Encounter (Signed)
Rx to pharmacy/SLS

## 2012-08-06 NOTE — Telephone Encounter (Signed)
LMOM with contact name & number for return call RE: results & further provider instructions; new Rx pending/SLS

## 2012-08-10 MED ORDER — LEVOTHYROXINE SODIUM 112 MCG PO TABS: 112.0000 ug | ORAL_TABLET | Freq: Every day | ORAL | Status: DC

## 2012-08-10 NOTE — Telephone Encounter (Signed)
Patien tinformed, understood & agreed; Rx to pharmacy with note *NEW DOSAGE-PLEASE PLACE ON FILE UNTIL PATIENT REQUEST REFILL*, as pt has just refilled prior dosage/SLS

## 2012-08-21 NOTE — Progress Notes (Signed)
  Subjective:    Patient ID: Kimberly Irwin, female    DOB: [birth year], 67 y.o.   MRN: [id number]  HPI.    Review of Systems     Objective:   Physical Exam        Assessment & Plan:

## 2012-09-20 ENCOUNTER — Ambulatory Visit (INDEPENDENT_AMBULATORY_CARE_PROVIDER_SITE_OTHER): Payer: Medicare Other | Admitting: Family Medicine

## 2012-09-20 ENCOUNTER — Encounter: Payer: Self-pay | Admitting: Family Medicine

## 2012-09-20 VITALS — BP 122/84 | HR 63 | Temp 98.8°F | Ht 64.0 in | Wt 191.0 lb

## 2012-09-20 DIAGNOSIS — E785 Hyperlipidemia, unspecified: Secondary | ICD-10-CM

## 2012-09-20 DIAGNOSIS — J329 Chronic sinusitis, unspecified: Secondary | ICD-10-CM | POA: Diagnosis not present

## 2012-09-20 DIAGNOSIS — I10 Essential (primary) hypertension: Secondary | ICD-10-CM

## 2012-09-20 DIAGNOSIS — E039 Hypothyroidism, unspecified: Secondary | ICD-10-CM

## 2012-09-20 DIAGNOSIS — J019 Acute sinusitis, unspecified: Secondary | ICD-10-CM | POA: Diagnosis not present

## 2012-09-20 DIAGNOSIS — D869 Sarcoidosis, unspecified: Secondary | ICD-10-CM | POA: Diagnosis not present

## 2012-09-20 MED ORDER — AMOXICILLIN-POT CLAVULANATE 875-125 MG PO TABS: 1.0000 | ORAL_TABLET | Freq: Two times a day (BID) | ORAL | Status: DC

## 2012-09-20 NOTE — Patient Instructions (Addendum)
Mucinex twice a day for 10 days and start a probiotic such as Align or Digestive Advantage daily for the bowels  Sinusitis Sinusitis is redness, soreness, and swelling (inflammation) of the paranasal sinuses. Paranasal sinuses are air pockets within the bones of your face (beneath the eyes, the middle of the forehead, or above the eyes). In healthy paranasal sinuses, mucus is able to drain out, and air is able to circulate through them by way of your nose. However, when your paranasal sinuses are inflamed, mucus and air can become trapped. This can allow bacteria and other germs to grow and cause infection. Sinusitis can develop quickly and last only a short time (acute) or continue over a long period (chronic). Sinusitis that lasts for more than 12 weeks is considered chronic.  CAUSES  Causes of sinusitis include:  Allergies.  Structural abnormalities, such as displacement of the cartilage that separates your nostrils (deviated septum), which can decrease the air flow through your nose and sinuses and affect sinus drainage.  Functional abnormalities, such as when the small hairs (cilia) that line your sinuses and help remove mucus do not work properly or are not present. SYMPTOMS  Symptoms of acute and chronic sinusitis are the same. The primary symptoms are pain and pressure around the affected sinuses. Other symptoms include:  Upper toothache.  Earache.  Headache.  Bad breath.  Decreased sense of smell and taste.  A cough, which worsens when you are lying flat.  Fatigue.  Fever.  Thick drainage from your nose, which often is green and may contain pus (purulent).  Swelling and warmth over the affected sinuses. DIAGNOSIS  Your caregiver will perform a physical exam. During the exam, your caregiver may:  Look in your nose for signs of abnormal growths in your nostrils (nasal polyps).  Tap over the affected sinus to check for signs of infection.  View the inside of your  sinuses (endoscopy) with a special imaging device with a light attached (endoscope), which is inserted into your sinuses. If your caregiver suspects that you have chronic sinusitis, one or more of the following tests may be recommended:  Allergy tests.  Nasal culture A sample of mucus is taken from your nose and sent to a lab and screened for bacteria.  Nasal cytology A sample of mucus is taken from your nose and examined by your caregiver to determine if your sinusitis is related to an allergy. TREATMENT  Most cases of acute sinusitis are related to a viral infection and will resolve on their own within 10 days. Sometimes medicines are prescribed to help relieve symptoms (pain medicine, decongestants, nasal steroid sprays, or saline sprays).  However, for sinusitis related to a bacterial infection, your caregiver will prescribe antibiotic medicines. These are medicines that will help kill the bacteria causing the infection.  Rarely, sinusitis is caused by a fungal infection. In theses cases, your caregiver will prescribe antifungal medicine. For some cases of chronic sinusitis, surgery is needed. Generally, these are cases in which sinusitis recurs more than 3 times per year, despite other treatments. HOME CARE INSTRUCTIONS   Drink plenty of water. Water helps thin the mucus so your sinuses can drain more easily.  Use a humidifier.  Inhale steam 3 to 4 times a day (for example, sit in the bathroom with the shower running).  Apply a warm, moist washcloth to your face 3 to 4 times a day, or as directed by your caregiver.  Use saline nasal sprays to help moisten and clean  your sinuses.  Take over-the-counter or prescription medicines for pain, discomfort, or fever only as directed by your caregiver. SEEK IMMEDIATE MEDICAL CARE IF:  You have increasing pain or severe headaches.  You have nausea, vomiting, or drowsiness.  You have swelling around your face.  You have vision  problems.  You have a stiff neck.  You have difficulty breathing. MAKE SURE YOU:   Understand these instructions.  Will watch your condition.  Will get help right away if you are not doing well or get worse. Document Released: 07/21/2005 Document Revised: 10/13/2011 Document Reviewed: 08/05/2011 [facility] Patient Information 2013 [place].

## 2012-09-26 NOTE — Assessment & Plan Note (Signed)
Well treated

## 2012-09-26 NOTE — Assessment & Plan Note (Signed)
Tolerating zocor

## 2012-09-26 NOTE — Assessment & Plan Note (Signed)
Well controlled no changes

## 2012-09-26 NOTE — Progress Notes (Signed)
Patient ID: Kimberly Irwin, female   DOB: 1946/06/01, 67 y.o.   MRN: [id number] Kimberly Irwin [id number] [birth year] 09/26/2012      Progress Note-Follow Up  Subjective  Chief Complaint  Chief Complaint  Patient presents with  . Follow-up    4 month    HPI  Patient is a 67 year old female in today for multiple concerns. She's not been feeling well for a week or 2. Has a frontal headache as well as malaise and myalgias. She's got postnasal drip and occasional cough productive of phlegm. She's shortness of breath with exertion. She has nasal congestion with clear rhinorrhea as well as some colitis discharge from the nose. No chest pain or palpitations GI or GU complaints. She is down to one pack per day after a Max of 5 packs per day.  Past Medical History  Diagnosis Date  . Sarcoidosis   . Hyperlipidemia   . GERD (gastroesophageal reflux disease)   . Hypertension   . Thyroid disease     hypothyroidism  . Chronic low back pain   . DDD (degenerative disc disease)     L3-4 with facet arthropathy and stenosis  . Degenerative spondylolisthesis     L4-5 grade 1 with stenosis  . Hyperglycemia 04/04/2011    Past Surgical History  Procedure Laterality Date  . Carpal tunnel release    . Thyroidectomy    . Back surgery  01/15/09    L3-4 and L4-5 decompressive laminectomy with bilateral L3, L4, and L5 decompressive foraminotomies, more than it would be required for simple interbody fusion alone.  . Back surgery  01/15/09    L3-4 and L4-5 posterior lumbar interbody fusion utilizing tanget interbody allograft wedge, Telamon interbody PEEk cage, and local autografting.  . Back surgery  01/15/09    L3, L4, and L5 posterolateral arthrodesis using segmental pedicle screw fixation and local autografting.  . Joint replacement  09/13/09    Right Hip, Dr. Alvan Dame    Family History  Problem Relation Age of Onset  . Cancer Mother 33    Breast  . Heart disease Mother   . Hypertension Mother   .  Hyperlipidemia Mother   . Heart attack Mother     History   Social History  . Marital Status: Married    Spouse Name: N/A    Number of Children: 2  . Years of Education: N/A   Occupational History  . Not on file.   Social History Main Topics  . Smoking status: Former Research scientist (life sciences)  . Smokeless tobacco: Never Used  . Alcohol Use: No  . Drug Use: Not on file  . Sexually Active: Not on file   Other Topics Concern  . Not on file   Social History Narrative   Married 47 years and has 2 children (2 daughters)   Alcohol Use - no   Former Smoker - she quit 10 years ago, started when she was 64 and has had varying level of use of tobacco products from 1/2 pack to 3 packs per day.    Current Outpatient Prescriptions on File Prior to Visit  Medication Sig Dispense Refill  . albuterol (PROAIR HFA) 108 (90 BASE) MCG/ACT inhaler Inhale 2 puffs into the lungs every 6 (six) hours as needed for wheezing.  1 Inhaler  6  . aspirin 81 MG tablet Take 81 mg by mouth daily.        . bisoprolol (ZEBETA) 5 MG tablet TAKE ONE TABLET BY MOUTH EVERY DAY  30 tablet  6  . Calcium Carbonate-Vitamin D (CALTRATE 600+D) 600-400 MG-UNIT per tablet Take 1 tablet by mouth 2 (two) times daily.      . celecoxib (CELEBREX) 200 MG capsule Take 200 mg by mouth daily as needed.        . Cholecalciferol (VITAMIN D) 1000 UNITS capsule Take 2 tablets by mouth at bedtime       . Cinnamon 500 MG capsule Take 2,000 mg by mouth daily.        . fluticasone (FLONASE) 50 MCG/ACT nasal spray 2 sprays by Nasal route daily.        . furosemide (LASIX) 20 MG tablet One by mouth in the morning as needed for leg swelling  30 tablet  1  . gabapentin (NEURONTIN) 300 MG capsule TAKE ONE CAPSULE BY MOUTH THREE TIMES DAILY  90 capsule  3  . hydrochlorothiazide (HYDRODIURIL) 25 MG tablet Take 1 tablet (25 mg total) by mouth daily.  30 tablet  3  . ketoconazole (NIZORAL) 2 % cream Apply topically daily. To affected area daily.  60 g  1  .  levothyroxine (SYNTHROID, LEVOTHROID) 112 MCG tablet Take 1 tablet (112 mcg total) by mouth daily.  90 tablet  1  . Multiple Vitamin (MULTIVITAMIN) tablet Take 1 tablet by mouth daily.      . Omega-3 Fatty Acids (FISH OIL) 1200 MG CAPS Take 2 capsules by mouth daily.        Marland Kitchen omeprazole (PRILOSEC) 20 MG capsule Take 20 mg by mouth 2 (two) times daily.        Marland Kitchen selenium sulfide (SELSUN) 2.5 % shampoo Apply topically 2 (two) times daily.  118 mL  1  . simvastatin (ZOCOR) 40 MG tablet Take 1 tablet (40 mg total) by mouth at bedtime.  30 tablet  6  . traMADol (ULTRAM) 50 MG tablet 1-2 tabs as needed every 6-8 hours for pain  180 tablet  2   No current facility-administered medications on file prior to visit.    Allergies  Allergen Reactions  . Metronidazole   . Penicillins   . Pregabalin     REACTION: Somnolence and dizziness  . Rosuvastatin     Review of Systems  Review of Systems  Constitutional: Positive for fever and malaise/fatigue.  HENT: Positive for congestion and sore throat.   Eyes: Negative for discharge.  Respiratory: Positive for cough, sputum production and shortness of breath.   Cardiovascular: Negative for chest pain, palpitations and leg swelling.  Gastrointestinal: Negative for nausea, abdominal pain and diarrhea.  Genitourinary: Negative for dysuria.  Musculoskeletal: Positive for myalgias. Negative for falls.  Skin: Negative for rash.  Neurological: Positive for headaches. Negative for loss of consciousness.  Endo/Heme/Allergies: Negative for polydipsia.  Psychiatric/Behavioral: Negative for depression and suicidal ideas. The patient is not nervous/anxious and does not have insomnia.     Objective  BP 122/84  Pulse 63  Temp(Src) 98.8 F (37.1 C) (Oral)  Ht _0  (1.626 m)  Wt 191 lb (86.637 kg)  BMI 32.77 kg/m2  SpO2 92%  Physical Exam  Physical Exam  Constitutional: She is oriented to person, place, and time and well-developed, well-nourished, and  in no distress. No distress.  HENT:  Head: Normocephalic and atraumatic.  Eyes: Conjunctivae are normal.  Neck: Neck supple. No thyromegaly present.  Cardiovascular: Normal rate, regular rhythm and normal heart sounds.   No murmur heard. Pulmonary/Chest: Effort normal and breath sounds normal. She has no wheezes.  Abdominal: She  exhibits no distension and no mass.  Musculoskeletal: She exhibits no edema.  Lymphadenopathy:    She has no cervical adenopathy.  Neurological: She is alert and oriented to person, place, and time.  Skin: Skin is warm and dry. No rash noted. She is not diaphoretic.  Psychiatric: Memory, affect and judgment normal.    Lab Results  Component Value Date   TSH 4.908* 07/21/2012   Lab Results  Component Value Date   WBC 15.3* 10/08/2010   HGB 17.0* 10/08/2010   HCT 48.9* 10/08/2010   MCV 85.8 10/08/2010   PLT 209 10/08/2010   Lab Results  Component Value Date   CREATININE 0.93 05/12/2012   BUN 15 05/12/2012   NA 140 05/12/2012   K 4.3 05/12/2012   CL 102 05/12/2012   CO2 27 05/12/2012   Lab Results  Component Value Date   ALT 34 01/07/2012   AST 37 01/07/2012   ALKPHOS 53 01/07/2012   BILITOT 0.7 01/07/2012   Lab Results  Component Value Date   CHOL 196 01/07/2012   Lab Results  Component Value Date   HDL 42 01/07/2012   Lab Results  Component Value Date   LDLCALC 111* 01/07/2012   Lab Results  Component Value Date   TRIG 213* 01/07/2012   Lab Results  Component Value Date   CHOLHDL 4.7 01/07/2012     Assessment & Plan  SINUSITIS- ACUTE-NOS Started on Augmentin, mucinex, probiotics, increase rest and hydration  HYPOTHYROIDISM Well treated  HYPERTENSION, CONTROLLED Well controlled no changes  HYPERLIPIDEMIA Tolerating zocor

## 2012-09-26 NOTE — Assessment & Plan Note (Signed)
Started on Augmentin, mucinex, probiotics, increase rest and hydration

## 2012-09-27 ENCOUNTER — Telehealth: Payer: Self-pay | Admitting: Family Medicine

## 2012-09-27 MED ORDER — EZETIMIBE-SIMVASTATIN 10-10 MG PO TABS: 1.0000 | ORAL_TABLET | Freq: Every day | ORAL | Status: DC

## 2012-09-27 NOTE — Telephone Encounter (Signed)
Patient Information:  Caller Name: Kaysi  Phone: [phone]  Patient: Kimberly Irwin, Kimberly Irwin  Gender: Female  DOB: September 05, 1945  Age: 67 Years  PCP: [facility])  Office Follow Up:  Does the office need to follow up with this patient?: Yes  Instructions For The Office: Would like to switch back to Vytorin to be pain free.  RN Note:  Asking about the continuation of the Simvastatin 50m.  Would prefer to go back on Vytorin in possible.  She was switched from that due to the cost.  Symptoms  Reason For Call & Symptoms: Having worsening leg pain/cramps after the Simvastatin was increased to 417m  Had slight pain at 2068mut it has worsened now.  The pain is worse at night, in both legs but worse in the right.  The pain is from her knees to the tip of her toes.  Requires Tramadol for the pain.    Reviewed Health History In EMR: Yes  Reviewed Medications In EMR: Yes  Reviewed Allergies In EMR: Yes  Reviewed Surgeries / Procedures: Yes  Date of Onset of Symptoms: Unknown  Treatments Tried: Tramadol  Treatments Tried Worked: No  Guideline(s) Used:  Leg Pain  Disposition Per Guideline:   See Within 3 Days in Office  Reason For Disposition Reached:   Moderate pain (e.g., interferes with normal activities, limping) and present > 3 days  Advice Given:  N/A  RN Overrode Recommendation:  Patient Requests Prescription  Vytorin

## 2012-09-27 NOTE — Telephone Encounter (Signed)
Script filled for vytorin filled

## 2012-09-27 NOTE — Telephone Encounter (Signed)
OK to d/c Simvastatin and start back on Vytorin 10/10 1 tab po daily, disp #30 with 5 rf or 90 with 1 refill at patient discretion

## 2012-09-30 ENCOUNTER — Encounter: Payer: Self-pay | Admitting: Critical Care Medicine

## 2012-09-30 ENCOUNTER — Ambulatory Visit (HOSPITAL_BASED_OUTPATIENT_CLINIC_OR_DEPARTMENT_OTHER)
Admission: RE | Admit: 2012-09-30 | Discharge: 2012-09-30 | Disposition: A | Discharge status: Home / Self Care | Payer: Medicare Other | Source: Ambulatory Visit | Attending: Critical Care Medicine | Admitting: Critical Care Medicine

## 2012-09-30 ENCOUNTER — Ambulatory Visit (INDEPENDENT_AMBULATORY_CARE_PROVIDER_SITE_OTHER): Payer: Medicare Other | Admitting: Critical Care Medicine

## 2012-09-30 VITALS — BP 140/80 | HR 77 | Temp 97.9°F | Ht 64.5 in | Wt 190.0 lb

## 2012-09-30 DIAGNOSIS — I517 Cardiomegaly: Secondary | ICD-10-CM | POA: Diagnosis not present

## 2012-09-30 DIAGNOSIS — R0602 Shortness of breath: Secondary | ICD-10-CM | POA: Insufficient documentation

## 2012-09-30 DIAGNOSIS — D869 Sarcoidosis, unspecified: Secondary | ICD-10-CM

## 2012-09-30 NOTE — Progress Notes (Signed)
Subjective:    Patient ID: Kimberly Irwin, female    DOB: [birth year], 67 y.o.   MRN: [id number] 67 y.o. W[place] Referral for sarcoidosis  Dx of same per Dr Patsey Berthold. Burney dx with mediastinoscopy x 2 49yr ago. Symptoms have been dyspnea.    Shortness of Breath This is a chronic problem. The current episode started more than 1 year ago. The problem occurs daily (Dyspnea with exertion, short distance only, ok at rest). The problem has been unchanged. Pertinent negatives include no abdominal pain, chest pain, ear pain, fever, headaches, leg swelling, neck pain, rash, rhinorrhea, sore throat, vomiting or wheezing. The symptoms are aggravated by fumes, URIs and weather changes (uri or sinus infx flares). Risk factors include smoking. She has tried beta agonist inhalers for the symptoms. The treatment provided no relief. Her past medical history is significant for chronic lung disease. There is no history of allergies, aspirin allergies, asthma, bronchiolitis, CAD, COPD, DVT, a heart failure, PE, pneumonia or a recent surgery.    Past Medical History  Diagnosis Date  . Sarcoidosis   . Hyperlipidemia   . GERD (gastroesophageal reflux disease)   . Hypertension   . Thyroid disease     hypothyroidism  . Chronic low back pain   . DDD (degenerative disc disease)     L3-4 with facet arthropathy and stenosis  . Degenerative spondylolisthesis     L4-5 grade 1 with stenosis  . Hyperglycemia 04/04/2011     Family History  Problem Relation Age of Onset  . Cancer Mother 657   Breast  . Heart disease Mother   . Hypertension Mother   . Hyperlipidemia Mother   . Heart attack Mother   . Asthma Maternal Grandmother      History   Social History  . Marital Status: Widowed    Spouse Name: N/A    Number of Children: 2  . Years of Education: N/A   Occupational History  . Not on file.   Social History Main Topics  . Smoking status: Former Smoker -- 0.50 packs/day for 32 years    Types: Cigarettes     Quit date: 08/04/1993  . Smokeless tobacco: Never Used  . Alcohol Use: No  . Drug Use: Not on file  . Sexually Active: Not on file   Other Topics Concern  . Not on file   Social History Narrative   Married 452years and has 2 children (2 daughters)   Alcohol Use - no   Former Smoker - she quit 10 years ago, started when she was 89and has had varying level of use of tobacco products from 1/2 pack to 3 packs per day.     Allergies  Allergen Reactions  . Lipitor (Atorvastatin)   . Metronidazole   . Penicillins   . Pregabalin     REACTION: Somnolence and dizziness  . Rosuvastatin      Outpatient Prescriptions Prior to Visit  Medication Sig Dispense Refill  . albuterol (PROAIR HFA) 108 (90 BASE) MCG/ACT inhaler Inhale 2 puffs into the lungs every 6 (six) hours as needed for wheezing.  1 Inhaler  6  . aspirin 81 MG tablet Take 81 mg by mouth daily.        . bisoprolol (ZEBETA) 5 MG tablet TAKE ONE TABLET BY MOUTH EVERY DAY  30 tablet  6  . Calcium Carbonate-Vitamin D (CALTRATE 600+D) 600-400 MG-UNIT per tablet Take 1 tablet by mouth 2 (two) times daily.      .Marland Kitchen  celecoxib (CELEBREX) 200 MG capsule Take 200 mg by mouth daily as needed.        . Cholecalciferol (VITAMIN D) 1000 UNITS capsule Take 2 tablets by mouth at bedtime       . Cinnamon 500 MG capsule Take 2,000 mg by mouth daily.        Marland Kitchen ezetimibe-simvastatin (VYTORIN) 10-10 MG per tablet Take 1 tablet by mouth at bedtime.  30 tablet  5  . furosemide (LASIX) 20 MG tablet One by mouth in the morning as needed for leg swelling  30 tablet  1  . gabapentin (NEURONTIN) 300 MG capsule TAKE ONE CAPSULE BY MOUTH THREE TIMES DAILY  90 capsule  3  . hydrochlorothiazide (HYDRODIURIL) 25 MG tablet Take 1 tablet (25 mg total) by mouth daily.  30 tablet  3  . ketoconazole (NIZORAL) 2 % cream Apply topically daily. To affected area daily.  60 g  1  . levothyroxine (SYNTHROID, LEVOTHROID) 112 MCG tablet Take 1 tablet (112 mcg total) by mouth  daily.  90 tablet  1  . Multiple Vitamin (MULTIVITAMIN) tablet Take 1 tablet by mouth daily.      . Omega-3 Fatty Acids (FISH OIL) 1200 MG CAPS Take 2 capsules by mouth daily.        Marland Kitchen omeprazole (PRILOSEC) 20 MG capsule Take 20 mg by mouth 2 (two) times daily.        Marland Kitchen selenium sulfide (SELSUN) 2.5 % shampoo Apply topically 2 (two) times daily.  118 mL  1  . traMADol (ULTRAM) 50 MG tablet 1-2 tabs as needed every 6-8 hours for pain  180 tablet  2  . fluticasone (FLONASE) 50 MCG/ACT nasal spray 2 sprays by Nasal route daily.        Marland Kitchen amoxicillin-clavulanate (AUGMENTIN) 875-125 MG per tablet Take 1 tablet by mouth 2 (two) times daily.  20 tablet  0   No facility-administered medications prior to visit.      Review of Systems  Constitutional: Negative for fever, chills, diaphoresis, activity change, appetite change, fatigue and unexpected weight change.  HENT: Positive for congestion and postnasal drip. Negative for hearing loss, ear pain, nosebleeds, sore throat, facial swelling, rhinorrhea, sneezing, mouth sores, trouble swallowing, neck pain, neck stiffness, dental problem, voice change, sinus pressure, tinnitus and ear discharge.   Eyes: Negative for photophobia, discharge, itching and visual disturbance.  Respiratory: Positive for cough and shortness of breath. Negative for apnea, choking, chest tightness, wheezing and stridor.   Cardiovascular: Negative for chest pain, palpitations and leg swelling.  Gastrointestinal: Negative for nausea, vomiting, abdominal pain, constipation, blood in stool and abdominal distention.       Hx of GERD on PPI  Genitourinary: Negative for dysuria, urgency, frequency, hematuria, flank pain, decreased urine volume and difficulty urinating.  Musculoskeletal: Negative for myalgias, back pain, joint swelling, arthralgias and gait problem.  Skin: Negative for color change, pallor and rash.  Neurological: Negative for dizziness, tremors, seizures, syncope, speech  difficulty, weakness, light-headedness, numbness and headaches.  Hematological: Negative for adenopathy. Does not bruise/bleed easily.  Psychiatric/Behavioral: Negative for confusion, sleep disturbance and agitation. The patient is not nervous/anxious.        Objective:   Physical Exam Filed Vitals:   09/30/12 1502  BP: 140/80  Pulse: 77  Temp: 97.9 F (36.6 C)  TempSrc: Oral  Height: 5' 4.5" (1.638 m)  Weight: 190 lb (86.183 kg)  SpO2: 94%    Gen: Pleasant,obese  in no distress,  normal affect  ENT: No lesions,  mouth clear,  oropharynx clear, no postnasal drip  Neck: No JVD, no TMG, no carotid bruits  Lungs: No use of accessory muscles, no dullness to percussion, clear without rales or rhonchi  Cardiovascular: RRR, heart sounds normal, no murmur or gallops, no peripheral edema  Abdomen: soft and NT, no HSM,  BS normal  Musculoskeletal: No deformities, no cyanosis or clubbing  Neuro: alert, non focal  Skin: Warm, no lesions or rashes     Assessment & Plan:   Sarcoidosis History of sarcoidosis going back to the 2004 2005 time frame. The patient formerly had been evaluated by a former partner of mine Dr. Patsey Berthold. The patient also had mediastinal biopsy of lymph nodes at least twice. I cannot find records of this in the electronic record at this time. We're awaiting the paper chart at this time.  It would appear that a baseline is necessary at this time. It is uncertain that sarcoid is a major issue in this patient. Note chest x-ray from 2011 did not show acute findings of sarcoidosis.  Plan. Obtain old records Obtain full set of pulmonary function studies Obtain ACE level Obtain chest x-ray No change in medications at this time   Updated Medication List Outpatient Encounter Prescriptions as of 09/30/2012  Medication Sig Dispense Refill  . albuterol (PROAIR HFA) 108 (90 BASE) MCG/ACT inhaler Inhale 2 puffs into the lungs every 6 (six) hours as needed for  wheezing.  1 Inhaler  6  . aspirin 81 MG tablet Take 81 mg by mouth daily.        . bisoprolol (ZEBETA) 5 MG tablet TAKE ONE TABLET BY MOUTH EVERY DAY  30 tablet  6  . Calcium Carbonate-Vitamin D (CALTRATE 600+D) 600-400 MG-UNIT per tablet Take 1 tablet by mouth 2 (two) times daily.      . celecoxib (CELEBREX) 200 MG capsule Take 200 mg by mouth daily as needed.        . Cholecalciferol (VITAMIN D) 1000 UNITS capsule Take 2 tablets by mouth at bedtime       . Cinnamon 500 MG capsule Take 2,000 mg by mouth daily.        Marland Kitchen ezetimibe-simvastatin (VYTORIN) 10-10 MG per tablet Take 1 tablet by mouth at bedtime.  30 tablet  5  . furosemide (LASIX) 20 MG tablet One by mouth in the morning as needed for leg swelling  30 tablet  1  . gabapentin (NEURONTIN) 300 MG capsule TAKE ONE CAPSULE BY MOUTH THREE TIMES DAILY  90 capsule  3  . hydrochlorothiazide (HYDRODIURIL) 25 MG tablet Take 1 tablet (25 mg total) by mouth daily.  30 tablet  3  . ketoconazole (NIZORAL) 2 % cream Apply topically daily. To affected area daily.  60 g  1  . levothyroxine (SYNTHROID, LEVOTHROID) 112 MCG tablet Take 1 tablet (112 mcg total) by mouth daily.  90 tablet  1  . Multiple Vitamin (MULTIVITAMIN) tablet Take 1 tablet by mouth daily.      . Omega-3 Fatty Acids (FISH OIL) 1200 MG CAPS Take 2 capsules by mouth daily.        Marland Kitchen omeprazole (PRILOSEC) 20 MG capsule Take 20 mg by mouth 2 (two) times daily.        Marland Kitchen selenium sulfide (SELSUN) 2.5 % shampoo Apply topically 2 (two) times daily.  118 mL  1  . traMADol (ULTRAM) 50 MG tablet 1-2 tabs as needed every 6-8 hours for pain  180 tablet  2  .  fluticasone (FLONASE) 50 MCG/ACT nasal spray 2 sprays by Nasal route daily.        . [DISCONTINUED] amoxicillin-clavulanate (AUGMENTIN) 875-125 MG per tablet Take 1 tablet by mouth 2 (two) times daily.  20 tablet  0   No facility-administered encounter medications on file as of 09/30/2012.

## 2012-09-30 NOTE — Patient Instructions (Signed)
Lab today Pulmonary function studies Chest xray will be obtained I will review old records from [place] Return 1 month, we will call sooner with results

## 2012-09-30 NOTE — Assessment & Plan Note (Addendum)
History of sarcoidosis going back to the 2004 2005 time frame. The patient formerly had been evaluated by a former partner of mine Dr. Patsey Berthold. The patient also had mediastinal biopsy of lymph nodes at least twice. I cannot find records of this in the electronic record at this time. We're awaiting the paper chart at this time.  It would appear that a baseline is necessary at this time. It is uncertain that sarcoid is a major issue in this patient. Note chest x-ray from 2011 did not show acute findings of sarcoidosis.  Plan. 57 old records Obtain full set of pulmonary function studies Obtain ACE level Obtain chest x-ray No change in medications at this time

## 2012-10-11 NOTE — Progress Notes (Signed)
Quick Note:  Call pt and tell her Angiotensin enzyme level is normal , suggesting Sarcoid is not active. Waiting on PFTs to be done No medication changes for now ______

## 2012-10-12 NOTE — Progress Notes (Signed)
Quick Note:  Called, spoke with pt. I informed her of lab results and recs per Dr. Joya Gaskins. Pt states she still hasn't received a call to schedule PFTs and this wasn't scheduled during her OV. She has a pending OV with [facility] on October 28, 2012 in [place]. We do not have any openings for PFT between now and then at the [place] office. Pt ok with going to [facility] to have PFTs done. I called Resp Dept, spoke with Sharyn Lull. We have scheduled pt for PFTs on Monday, March 17 at [facility]. Pt needs to arrive at 9:45 am to check in at admitting. I called pt back -- informed her of PFT date, time, location. She verbalized understanding and voiced no further questions or concerns at this time. ______

## 2012-10-18 ENCOUNTER — Ambulatory Visit (HOSPITAL_COMMUNITY)
Admission: RE | Admit: 2012-10-18 | Discharge: 2012-10-18 | Disposition: A | Discharge status: Home / Self Care | Payer: Medicare Other | Source: Ambulatory Visit | Attending: Critical Care Medicine | Admitting: Critical Care Medicine

## 2012-10-18 DIAGNOSIS — R05 Cough: Secondary | ICD-10-CM | POA: Diagnosis not present

## 2012-10-18 DIAGNOSIS — R059 Cough, unspecified: Secondary | ICD-10-CM | POA: Diagnosis not present

## 2012-10-18 DIAGNOSIS — R0989 Other specified symptoms and signs involving the circulatory and respiratory systems: Secondary | ICD-10-CM | POA: Insufficient documentation

## 2012-10-18 DIAGNOSIS — D869 Sarcoidosis, unspecified: Secondary | ICD-10-CM | POA: Insufficient documentation

## 2012-10-18 DIAGNOSIS — J988 Other specified respiratory disorders: Secondary | ICD-10-CM | POA: Insufficient documentation

## 2012-10-18 DIAGNOSIS — R062 Wheezing: Secondary | ICD-10-CM | POA: Diagnosis not present

## 2012-10-18 DIAGNOSIS — R0609 Other forms of dyspnea: Secondary | ICD-10-CM | POA: Insufficient documentation

## 2012-10-18 LAB — PULMONARY FUNCTION TEST

## 2012-10-18 MED ORDER — ALBUTEROL SULFATE (5 MG/ML) 0.5% IN NEBU
2.5000 mg | INHALATION_SOLUTION | Freq: Once | RESPIRATORY_TRACT | Status: AC
  Administered 2012-10-18: 2.5 mg via RESPIRATORY_TRACT

## 2012-10-25 ENCOUNTER — Telehealth: Payer: Self-pay | Admitting: Family Medicine

## 2012-10-25 NOTE — Telephone Encounter (Signed)
Patient Information:  Caller Name: [place]  Phone: [phone]  Patient: Kimberly Irwin, Kimberly Irwin  Gender: Female  DOB: October 06, 1945  Age: 67 Years  PCP: Penni Homans [facility])  Office Follow Up:  Does the office need to follow up with this patient?: No  Instructions For The Office: N/A  RN Note:  Having left leg pain for several months.  Patient feels that it is bone deep.  Rates pain as a 9/10 and it is worse at night. Is depending on tramadol and takes twice a day.  Had hip replacement of right hip--does not think that this is hip related.  Has back issues but does not think it is related.  Is having to walk with a cane-and she is able to take care of self with daughter's help.  Care advice given with appointment scheduled for 10/26/12 at 09:15  Symptoms  Reason For Call & Symptoms: Leg pain (left)  Reviewed Health History In EMR: Yes  Reviewed Medications In EMR: Yes  Reviewed Allergies In EMR: Yes  Reviewed Surgeries / Procedures: Yes  Date of Onset of Symptoms: Unknown  Guideline(s) Used:  Leg Pain  Disposition Per Guideline:   See Today or Tomorrow in Office  Reason For Disposition Reached:   Patient wants to be seen  Advice Given:  Call Back If:  You become worse.  Patient Will Follow Care Advice:  YES  Appointment Scheduled:  10/26/2012 09:15:00 Appointment Scheduled Provider:  Penni Homans [facility])

## 2012-10-26 ENCOUNTER — Encounter: Payer: Self-pay | Admitting: Family Medicine

## 2012-10-26 ENCOUNTER — Ambulatory Visit (HOSPITAL_BASED_OUTPATIENT_CLINIC_OR_DEPARTMENT_OTHER)
Admission: RE | Admit: 2012-10-26 | Discharge: 2012-10-26 | Disposition: A | Discharge status: Home / Self Care | Payer: Medicare Other | Source: Ambulatory Visit | Attending: Family Medicine | Admitting: Family Medicine

## 2012-10-26 ENCOUNTER — Ambulatory Visit (INDEPENDENT_AMBULATORY_CARE_PROVIDER_SITE_OTHER): Payer: Medicare Other | Admitting: Family Medicine

## 2012-10-26 VITALS — BP 142/92 | HR 79 | Temp 98.4°F | Ht 64.0 in | Wt 190.0 lb

## 2012-10-26 DIAGNOSIS — R109 Unspecified abdominal pain: Secondary | ICD-10-CM | POA: Insufficient documentation

## 2012-10-26 DIAGNOSIS — M7072 Other bursitis of hip, left hip: Secondary | ICD-10-CM | POA: Insufficient documentation

## 2012-10-26 DIAGNOSIS — R7309 Other abnormal glucose: Secondary | ICD-10-CM | POA: Diagnosis not present

## 2012-10-26 DIAGNOSIS — M79609 Pain in unspecified limb: Secondary | ICD-10-CM | POA: Diagnosis not present

## 2012-10-26 DIAGNOSIS — M25552 Pain in left hip: Secondary | ICD-10-CM

## 2012-10-26 DIAGNOSIS — M25559 Pain in unspecified hip: Secondary | ICD-10-CM

## 2012-10-26 DIAGNOSIS — M25569 Pain in unspecified knee: Secondary | ICD-10-CM | POA: Insufficient documentation

## 2012-10-26 DIAGNOSIS — S99929A Unspecified injury of unspecified foot, initial encounter: Secondary | ICD-10-CM | POA: Diagnosis not present

## 2012-10-26 DIAGNOSIS — I10 Essential (primary) hypertension: Secondary | ICD-10-CM | POA: Diagnosis not present

## 2012-10-26 DIAGNOSIS — M76899 Other specified enthesopathies of unspecified lower limb, excluding foot: Secondary | ICD-10-CM

## 2012-10-26 DIAGNOSIS — R739 Hyperglycemia, unspecified: Secondary | ICD-10-CM

## 2012-10-26 DIAGNOSIS — M79605 Pain in left leg: Secondary | ICD-10-CM

## 2012-10-26 DIAGNOSIS — S79919A Unspecified injury of unspecified hip, initial encounter: Secondary | ICD-10-CM | POA: Diagnosis not present

## 2012-10-26 DIAGNOSIS — S8990XA Unspecified injury of unspecified lower leg, initial encounter: Secondary | ICD-10-CM | POA: Diagnosis not present

## 2012-10-26 HISTORY — DX: Pain in unspecified knee: M25.569

## 2012-10-26 HISTORY — DX: Other bursitis of hip, left hip: M70.72

## 2012-10-26 MED ORDER — PREDNISONE 20 MG PO TABS: 20.0000 mg | ORAL_TABLET | Freq: Every day | ORAL | Status: DC

## 2012-10-26 MED ORDER — METHYLPREDNISOLONE ACETATE 40 MG/ML IJ SUSP
40.0000 mg | Freq: Once | INTRAMUSCULAR | Status: AC
  Administered 2012-10-26: 40 mg via INTRAMUSCULAR

## 2012-10-26 NOTE — Progress Notes (Signed)
Quick Note:  Patient Informed and voiced understanding ______

## 2012-10-26 NOTE — Assessment & Plan Note (Addendum)
Left with radicular symptoms down leg for roughly 4 days did have a fall on to right side a couple weeks ago when she slipped on some ice. Given Depo Medrol 40 mg in office,due to intensity of pain will check xray of tibial plateau and hip today. Start on Prednisone tomorrow and increase Tramadol. Follow up with ortho if no improvement. With radicular symptoms down leg, xrays of hip and tibial plateau which are the most painful are normal.

## 2012-10-26 NOTE — Assessment & Plan Note (Signed)
Encouraged to minimize carbs this next week and increase proteins to manage sugars on steroids

## 2012-10-26 NOTE — Assessment & Plan Note (Signed)
Mild elevation with acute pain, no changes

## 2012-10-26 NOTE — Progress Notes (Signed)
Quick Note:  Patient Informed and voiced understanding ______ 

## 2012-10-26 NOTE — Progress Notes (Signed)
Patient ID: Kimberly Irwin, female   DOB: 1946/03/22, 67 y.o.   MRN: [id number] Kimberly Irwin [id number] Jan 17, 1946 10/26/2012      Progress Note-Follow Up  Subjective  Chief Complaint  Chief Complaint  Patient presents with  . Leg Pain    left    HPI  Patient is a 67 yo Caucasian female who is in today with a four-day history of worsening left lower extremity pain. She denies any recent injury although she did slip on the ice a few weeks ago and fell onto her right side. At that time pain was resolved by the next day. In the last 4 days she has had worsening left hip pain. Describes pain in his posterior hip and into the groin. Has pain down the entire left leg the pain is most intense in notable on the left anterior tibial plateau. Pain is noted with palpation. She was unable to find a comfortable position other weightbearing is worse than any other activity. No numbness or tingling. No incontinence. No change in GI or GU habits. Denies chest pains, fevers, recent illness, shortness of breath or palpitations. Tramadol does typically have her pain some and has not helped this pain  Past Medical History  Diagnosis Date  . Sarcoidosis   . Hyperlipidemia   . GERD (gastroesophageal reflux disease)   . Hypertension   . Thyroid disease     hypothyroidism  . Chronic low back pain   . DDD (degenerative disc disease)     L3-4 with facet arthropathy and stenosis  . Degenerative spondylolisthesis     L4-5 grade 1 with stenosis  . Hyperglycemia 04/04/2011  . Acute hip pain 10/26/2012    Past Surgical History  Procedure Laterality Date  . Carpal tunnel release    . Thyroidectomy    . Back surgery  01/15/09    L3-4 and L4-5 decompressive laminectomy with bilateral L3, L4, and L5 decompressive foraminotomies, more than it would be required for simple interbody fusion alone.  . Back surgery  01/15/09    L3-4 and L4-5 posterior lumbar interbody fusion utilizing tanget interbody allograft wedge,  Telamon interbody PEEk cage, and local autografting.  . Back surgery  01/15/09    L3, L4, and L5 posterolateral arthrodesis using segmental pedicle screw fixation and local autografting.  . Joint replacement  09/13/09    Right Hip, Dr. Alvan Dame    Family History  Problem Relation Age of Onset  . Cancer Mother 77    Breast  . Heart disease Mother   . Hypertension Mother   . Hyperlipidemia Mother   . Heart attack Mother   . Asthma Maternal Grandmother     History   Social History  . Marital Status: Widowed    Spouse Name: N/A    Number of Children: 2  . Years of Education: N/A   Occupational History  . Not on file.   Social History Main Topics  . Smoking status: Former Smoker -- 0.50 packs/day for 32 years    Types: Cigarettes    Quit date: 08/04/1993  . Smokeless tobacco: Never Used  . Alcohol Use: No  . Drug Use: Not on file  . Sexually Active: Not on file   Other Topics Concern  . Not on file   Social History Narrative   Married 43 years and has 2 children (2 daughters)   Alcohol Use - no   Former Smoker - she quit 10 years ago, started when she was 23 and  has had varying level of use of tobacco products from 1/2 pack to 3 packs per day.    Current Outpatient Prescriptions on File Prior to Visit  Medication Sig Dispense Refill  . aspirin 81 MG tablet Take 81 mg by mouth daily.        . bisoprolol (ZEBETA) 5 MG tablet TAKE ONE TABLET BY MOUTH EVERY DAY  30 tablet  6  . Calcium Carbonate-Vitamin D (CALTRATE 600+D) 600-400 MG-UNIT per tablet Take 1 tablet by mouth 2 (two) times daily.      . celecoxib (CELEBREX) 200 MG capsule Take 200 mg by mouth daily as needed.        . Cholecalciferol (VITAMIN D) 1000 UNITS capsule Take 2 tablets by mouth at bedtime       . Cinnamon 500 MG capsule Take 2,000 mg by mouth daily.        Marland Kitchen ezetimibe-simvastatin (VYTORIN) 10-10 MG per tablet Take 1 tablet by mouth at bedtime.  30 tablet  5  . fluticasone (FLONASE) 50 MCG/ACT nasal spray  2 sprays by Nasal route daily.        . furosemide (LASIX) 20 MG tablet One by mouth in the morning as needed for leg swelling  30 tablet  1  . gabapentin (NEURONTIN) 300 MG capsule TAKE ONE CAPSULE BY MOUTH THREE TIMES DAILY  90 capsule  3  . hydrochlorothiazide (HYDRODIURIL) 25 MG tablet Take 1 tablet (25 mg total) by mouth daily.  30 tablet  3  . ketoconazole (NIZORAL) 2 % cream Apply topically daily. To affected area daily.  60 g  1  . levothyroxine (SYNTHROID, LEVOTHROID) 112 MCG tablet Take 1 tablet (112 mcg total) by mouth daily.  90 tablet  1  . Multiple Vitamin (MULTIVITAMIN) tablet Take 1 tablet by mouth daily.      . Omega-3 Fatty Acids (FISH OIL) 1200 MG CAPS Take 2 capsules by mouth daily.        Marland Kitchen omeprazole (PRILOSEC) 20 MG capsule Take 20 mg by mouth 2 (two) times daily.        Marland Kitchen selenium sulfide (SELSUN) 2.5 % shampoo Apply topically 2 (two) times daily.  118 mL  1  . traMADol (ULTRAM) 50 MG tablet 1-2 tabs as needed every 6-8 hours for pain  180 tablet  2   No current facility-administered medications on file prior to visit.    Allergies  Allergen Reactions  . Lipitor (Atorvastatin)   . Metronidazole   . Penicillins   . Pregabalin     REACTION: Somnolence and dizziness  . Rosuvastatin     Review of Systems  Review of Systems  Constitutional: Negative for fever and malaise/fatigue.  HENT: Negative for congestion.   Eyes: Negative for discharge.  Respiratory: Negative for shortness of breath.   Cardiovascular: Negative for chest pain, palpitations and leg swelling.  Gastrointestinal: Negative for heartburn, nausea, abdominal pain, diarrhea, constipation and blood in stool.  Genitourinary: Negative for dysuria, urgency, frequency, hematuria and flank pain.  Musculoskeletal: Positive for joint pain. Negative for falls.       Left hip pain, left leg pain worse along anterior tibial plateau  Skin: Negative for rash.  Neurological: Negative for loss of consciousness  and headaches.  Endo/Heme/Allergies: Negative for polydipsia.  Psychiatric/Behavioral: Negative for depression and suicidal ideas. The patient is not nervous/anxious and does not have insomnia.     Objective  BP 142/92  Pulse 79  Temp(Src) 98.4 F (36.9 C) (Oral)  Ht  _0  (1.626 m)  Wt 190 lb 0.6 oz (86.202 kg)  BMI 32.6 kg/m2  SpO2 92%  Physical Exam  Physical Exam  Constitutional: She appears well-developed and well-nourished. No distress.  HENT:  Head: Normocephalic and atraumatic.  Nose: Nose normal.  Eyes: Conjunctivae are normal. Left eye exhibits no discharge.  Neck: Normal range of motion. Neck supple.  Cardiovascular: Normal rate, regular rhythm and normal heart sounds.   No murmur heard. Pulmonary/Chest: Effort normal and breath sounds normal. No respiratory distress. She has no wheezes.  Abdominal: Soft. Bowel sounds are normal. She exhibits no distension. There is no tenderness. There is no rebound.  Musculoskeletal: She exhibits no edema.  Tender with palp over L anterior tibial  Plateau and slight over posterior left sacroiliac joint. No pain with passive straight leg raise or internal, external rotation at hip  Skin: She is not diaphoretic.    Lab Results  Component Value Date   TSH 4.908* 07/21/2012   Lab Results  Component Value Date   WBC 15.3* 10/08/2010   HGB 17.0* 10/08/2010   HCT 48.9* 10/08/2010   MCV 85.8 10/08/2010   PLT 209 10/08/2010   Lab Results  Component Value Date   CREATININE 0.93 05/12/2012   BUN 15 05/12/2012   NA 140 05/12/2012   K 4.3 05/12/2012   CL 102 05/12/2012   CO2 27 05/12/2012   Lab Results  Component Value Date   ALT 34 01/07/2012   AST 37 01/07/2012   ALKPHOS 53 01/07/2012   BILITOT 0.7 01/07/2012   Lab Results  Component Value Date   CHOL 196 01/07/2012   Lab Results  Component Value Date   HDL 42 01/07/2012   Lab Results  Component Value Date   LDLCALC 111* 01/07/2012   Lab Results  Component Value Date   TRIG 213*  01/07/2012   Lab Results  Component Value Date   CHOLHDL 4.7 01/07/2012     Assessment & Plan  HYPERTENSION, CONTROLLED Mild elevation with acute pain, no changes  Acute hip pain Left with radicular symptoms down leg for roughly 4 days did have a fall on to right side a couple weeks ago when she slipped on some ice. Given Depo Medrol 40 mg in office,due to intensity of pain will check xray of tibial plateau and hip today. Start on Prednisone tomorrow and increase Tramadol. Follow up with ortho if no improvement. With radicular symptoms down leg, xrays of hip and tibial plateau which are the most painful are normal.   Hyperglycemia Encouraged to minimize carbs this next week and increase proteins to manage sugars on steroids

## 2012-10-26 NOTE — Patient Instructions (Signed)
Hip Bursitis Bursitis is a swelling and soreness (inflammation) of a fluid-filled sac (bursa). This sac overlies and protects the joints.  CAUSES   Injury.  Overuse of the muscles surrounding the joint.  Arthritis.  Gout.  Infection.  Cold weather.  Inadequate warm-up and conditioning prior to activities. The cause may not be known.  SYMPTOMS   Mild to severe irritation.  Tenderness and swelling over the outside of the hip.  Pain with motion of the hip.  If the bursa becomes infected, a fever may be present. Redness, tenderness, and warmth will develop over the hip. Symptoms usually lessen in 3 to 4 weeks with treatment, but can come back. TREATMENT If conservative treatment does not work, your caregiver may advise draining the bursa and injecting cortisone into the area. This may speed up the healing process. This may also be used as an initial treatment of choice. HOME CARE INSTRUCTIONS   Apply ice to the affected area for 15 to 20 minutes every 3 to 4 hours while awake for the first 2 days. Put the ice in a plastic bag and place a towel between the bag of ice and your skin.  Rest the painful joint as much as possible, but continue to put the joint through a normal range of motion at least 4 times per day. When the pain lessens, begin normal, slow movements and usual activities to help prevent stiffness of the hip.  Only take over-the-counter or prescription medicines for pain, discomfort, or fever as directed by your caregiver.  Use crutches to limit weight bearing on the hip joint, if advised.  Elevate your painful hip to reduce swelling. Use pillows for propping and cushioning your legs and hips.  Gentle massage may provide comfort and decrease swelling. SEEK IMMEDIATE MEDICAL CARE IF:   Your pain increases even during treatment, or you are not improving.  You have a fever.  You have heat and inflammation over the involved bursa.  You have any other questions  or concerns. MAKE SURE YOU:   Understand these instructions.  Will watch your condition.  Will get help right away if you are not doing well or get worse. Document Released: 01/10/2002 Document Revised: 10/13/2011 Document Reviewed: 08/09/2008 [facility] Patient Information 2013 [place].

## 2012-10-27 ENCOUNTER — Telehealth: Payer: Self-pay | Admitting: Internal Medicine

## 2012-10-27 ENCOUNTER — Telehealth: Payer: Self-pay | Admitting: Family Medicine

## 2012-10-27 DIAGNOSIS — M79605 Pain in left leg: Secondary | ICD-10-CM

## 2012-10-27 DIAGNOSIS — M25552 Pain in left hip: Secondary | ICD-10-CM

## 2012-10-27 MED ORDER — PREDNISONE 20 MG PO TABS: 20.0000 mg | ORAL_TABLET | Freq: Every day | ORAL | Status: DC

## 2012-10-27 MED ORDER — PREDNISONE 20 MG PO TABS: ORAL_TABLET | ORAL | Status: DC

## 2012-10-27 NOTE — Telephone Encounter (Signed)
Called after office visit 10/26/12 to clarify dose of Prednisone.  MD said she could take it either 2 tabs daily or split the dose but use 2 pills daily for 5 days.  Rx instructions specify 1 po daily with #10 dispensed.  Please call her at [phone] to clarify.

## 2012-10-27 NOTE — Telephone Encounter (Signed)
Patient informed--Sig: Take 2 tablets daily for 5 days. Start on 10/27/12--DOSING INSTRUCTIONS PER VO, DR. Charlett Blake 03.27.14/SLS

## 2012-10-28 ENCOUNTER — Encounter: Payer: Self-pay | Admitting: Critical Care Medicine

## 2012-10-28 ENCOUNTER — Telehealth: Payer: Self-pay | Admitting: Internal Medicine

## 2012-10-28 ENCOUNTER — Ambulatory Visit (INDEPENDENT_AMBULATORY_CARE_PROVIDER_SITE_OTHER): Payer: Medicare Other | Admitting: Critical Care Medicine

## 2012-10-28 VITALS — BP 140/90 | HR 68 | Temp 98.4°F | Ht 64.5 in | Wt 188.0 lb

## 2012-10-28 DIAGNOSIS — D869 Sarcoidosis, unspecified: Secondary | ICD-10-CM | POA: Diagnosis not present

## 2012-10-28 NOTE — Telephone Encounter (Signed)
Unless you can find documentation in the chart I have no way of knowing for sure what shots she specifically got on a certain day in [address]11 before I knew her. Sorry

## 2012-10-28 NOTE — Progress Notes (Signed)
Subjective:    Patient ID: Kimberly Irwin, female    DOB: 1946/01/20, 67 y.o.   MRN: [id number] 67 y.o. [place] Referral for sarcoidosis  Dx of same per Dr Patsey Berthold. Burney dx with mediastinoscopy x 2 9yr ago. Symptoms have been dyspnea.    Shortness of Breath This is a chronic problem. The current episode started more than 1 year ago. The problem occurs daily (Dyspnea with exertion, short distance only, ok at rest). The problem has been unchanged. Pertinent negatives include no abdominal pain, chest pain, ear pain, fever, headaches, leg swelling, neck pain, rash, rhinorrhea, sore throat, vomiting or wheezing. The symptoms are aggravated by fumes, URIs and weather changes (uri or sinus infx flares). Risk factors include smoking. She has tried beta agonist inhalers for the symptoms. The treatment provided no relief. Her past medical history is significant for chronic lung disease. There is no history of allergies, aspirin allergies, asthma, bronchiolitis, CAD, COPD, DVT, a heart failure, PE, pneumonia or a recent surgery.   10/28/2012 F/u dyspnea and ?sarcoidosis. CXR was normal. ACE normal PFTs:essentially normal.  Now with bursitis. On pred and gave injection.  No wheezing. Dyspnea is the same. No real cough now.  ABX in past since 2/27 OV. This helped the coughing.   No real chest pain.   Past Medical History  Diagnosis Date  . Sarcoidosis   . Hyperlipidemia   . GERD (gastroesophageal reflux disease)   . Hypertension   . Thyroid disease     hypothyroidism  . Chronic low back pain   . DDD (degenerative disc disease)     L3-4 with facet arthropathy and stenosis  . Degenerative spondylolisthesis     L4-5 grade 1 with stenosis  . Hyperglycemia 04/04/2011  . Acute hip pain 10/26/2012  . Bursitis of left hip 10/26/2012     Family History  Problem Relation Age of Onset  . Cancer Mother 659   Breast  . Heart disease Mother   . Hypertension Mother   . Hyperlipidemia Mother   . Heart attack  Mother   . Asthma Maternal Grandmother      History   Social History  . Marital Status: Widowed    Spouse Name: N/A    Number of Children: 2  . Years of Education: N/A   Occupational History  . Not on file.   Social History Main Topics  . Smoking status: Former Smoker -- 0.50 packs/day for 32 years    Types: Cigarettes    Quit date: 08/04/1993  . Smokeless tobacco: Never Used  . Alcohol Use: No  . Drug Use: Not on file  . Sexually Active: Not on file   Other Topics Concern  . Not on file   Social History Narrative   Married 449years and has 2 children (2 daughters)   Alcohol Use - no   Former Smoker - she quit 10 years ago, started when she was 89and has had varying level of use of tobacco products from 1/2 pack to 3 packs per day.     Allergies  Allergen Reactions  . Lipitor (Atorvastatin)   . Metronidazole   . Penicillins   . Pregabalin     REACTION: Somnolence and dizziness  . Rosuvastatin   . Simvastatin     Leg pain     Outpatient Prescriptions Prior to Visit  Medication Sig Dispense Refill  . albuterol (PROAIR HFA) 108 (90 BASE) MCG/ACT inhaler Inhale 2 puffs into the lungs every 6 (  six) hours as needed for wheezing.  1 Inhaler  6  . aspirin 81 MG tablet Take 81 mg by mouth daily.        . bisoprolol (ZEBETA) 5 MG tablet TAKE ONE TABLET BY MOUTH EVERY DAY  30 tablet  6  . Calcium Carbonate-Vitamin D (CALTRATE 600+D) 600-400 MG-UNIT per tablet Take 1 tablet by mouth 2 (two) times daily.      . celecoxib (CELEBREX) 200 MG capsule Take 200 mg by mouth daily as needed.        . Cholecalciferol (VITAMIN D) 1000 UNITS capsule Take 2 tablets by mouth at bedtime       . Cinnamon 500 MG capsule Take 2,000 mg by mouth daily.        Marland Kitchen ezetimibe-simvastatin (VYTORIN) 10-10 MG per tablet Take 1 tablet by mouth at bedtime.  30 tablet  5  . furosemide (LASIX) 20 MG tablet One by mouth in the morning as needed for leg swelling  30 tablet  1  . hydrochlorothiazide  (HYDRODIURIL) 25 MG tablet Take 1 tablet (25 mg total) by mouth daily.  30 tablet  3  . levothyroxine (SYNTHROID, LEVOTHROID) 112 MCG tablet Take 1 tablet (112 mcg total) by mouth daily.  90 tablet  1  . Multiple Vitamin (MULTIVITAMIN) tablet Take 1 tablet by mouth daily.      . Omega-3 Fatty Acids (FISH OIL) 1200 MG CAPS Take 2 capsules by mouth daily.        Marland Kitchen omeprazole (PRILOSEC) 20 MG capsule Take 20 mg by mouth at bedtime.       . predniSONE (DELTASONE) 20 MG tablet Take 2 tablets daily for 5 days. Start on 10/27/12  10 tablet  0  . gabapentin (NEURONTIN) 300 MG capsule TAKE ONE CAPSULE BY MOUTH THREE TIMES DAILY  90 capsule  3  . ketoconazole (NIZORAL) 2 % cream Apply topically daily. To affected area daily.  60 g  1  . traMADol (ULTRAM) 50 MG tablet 1-2 tabs as needed every 6-8 hours for pain  180 tablet  2  . fluticasone (FLONASE) 50 MCG/ACT nasal spray 2 sprays by Nasal route daily.        Marland Kitchen selenium sulfide (SELSUN) 2.5 % shampoo Apply topically 2 (two) times daily.  118 mL  1   No facility-administered medications prior to visit.      Review of Systems  Constitutional: Negative for fever, chills, diaphoresis, activity change, appetite change, fatigue and unexpected weight change.  HENT: Positive for congestion and postnasal drip. Negative for hearing loss, ear pain, nosebleeds, sore throat, facial swelling, rhinorrhea, sneezing, mouth sores, trouble swallowing, neck pain, neck stiffness, dental problem, voice change, sinus pressure, tinnitus and ear discharge.   Eyes: Negative for photophobia, discharge, itching and visual disturbance.  Respiratory: Positive for cough and shortness of breath. Negative for apnea, choking, chest tightness, wheezing and stridor.   Cardiovascular: Negative for chest pain, palpitations and leg swelling.  Gastrointestinal: Negative for nausea, vomiting, abdominal pain, constipation, blood in stool and abdominal distention.       Hx of GERD on PPI   Genitourinary: Negative for dysuria, urgency, frequency, hematuria, flank pain, decreased urine volume and difficulty urinating.  Musculoskeletal: Negative for myalgias, back pain, joint swelling, arthralgias and gait problem.  Skin: Negative for color change, pallor and rash.  Neurological: Negative for dizziness, tremors, seizures, syncope, speech difficulty, weakness, light-headedness, numbness and headaches.  Hematological: Negative for adenopathy. Does not bruise/bleed easily.  Psychiatric/Behavioral: Negative for  confusion, sleep disturbance and agitation. The patient is not nervous/anxious.        Objective:   Physical Exam  Filed Vitals:   10/28/12 1428  BP: 140/90  Pulse: 68  Temp: 98.4 F (36.9 C)  TempSrc: Oral  Height: 5' 4.5" (1.638 m)  Weight: 188 lb (85.276 kg)  SpO2: 91%    Gen: Pleasant,obese  in no distress,  normal affect  ENT: No lesions,  mouth clear,  oropharynx clear, no postnasal drip  Neck: No JVD, no TMG, no carotid bruits  Lungs: No use of accessory muscles, no dullness to percussion, clear without rales or rhonchi  Cardiovascular: RRR, heart sounds normal, no murmur or gallops, no peripheral edema  Abdomen: soft and NT, no HSM,  BS normal  Musculoskeletal: No deformities, no cyanosis or clubbing  Neuro: alert, non focal  Skin: Warm, no lesions or rashes     Assessment & Plan:   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 Note current chest x-ray is negative for disease activity. Note pulmonary functions are normal including normal diffusion capacity and lung volumes. Note ACE level is normal. At this point the patient does not appear to have active sarcoidosis and does not require systemic therapy Plan Return as needed if symptoms recur No systemic therapy    Updated Medication List Outpatient Encounter Prescriptions as of 10/28/2012  Medication Sig Dispense Refill  .  albuterol (PROAIR HFA) 108 (90 BASE) MCG/ACT inhaler Inhale 2 puffs into the lungs every 6 (six) hours as needed for wheezing.  1 Inhaler  6  . aspirin 81 MG tablet Take 81 mg by mouth daily.        . bisoprolol (ZEBETA) 5 MG tablet TAKE ONE TABLET BY MOUTH EVERY DAY  30 tablet  6  . Calcium Carbonate-Vitamin D (CALTRATE 600+D) 600-400 MG-UNIT per tablet Take 1 tablet by mouth 2 (two) times daily.      . celecoxib (CELEBREX) 200 MG capsule Take 200 mg by mouth daily as needed.        . Cholecalciferol (VITAMIN D) 1000 UNITS capsule Take 2 tablets by mouth at bedtime       . Cinnamon 500 MG capsule Take 2,000 mg by mouth daily.        Marland Kitchen ezetimibe-simvastatin (VYTORIN) 10-10 MG per tablet Take 1 tablet by mouth at bedtime.  30 tablet  5  . furosemide (LASIX) 20 MG tablet One by mouth in the morning as needed for leg swelling  30 tablet  1  . gabapentin (NEURONTIN) 300 MG capsule Take 300 mg by mouth. Every night and as needed during the day      . hydrochlorothiazide (HYDRODIURIL) 25 MG tablet Take 1 tablet (25 mg total) by mouth daily.  30 tablet  3  . ketoconazole (NIZORAL) 2 % cream Apply topically as needed. To affected area daily.      Marland Kitchen levothyroxine (SYNTHROID, LEVOTHROID) 112 MCG tablet Take 1 tablet (112 mcg total) by mouth daily.  90 tablet  1  . Multiple Vitamin (MULTIVITAMIN) tablet Take 1 tablet by mouth daily.      . Omega-3 Fatty Acids (FISH OIL) 1200 MG CAPS Take 2 capsules by mouth daily.        Marland Kitchen omeprazole (PRILOSEC) 20 MG capsule Take 20 mg by mouth at bedtime.       . predniSONE (DELTASONE) 20 MG tablet Take 2 tablets daily for 5 days. Start on 10/27/12  10 tablet  0  . traMADol (ULTRAM) 50 MG tablet 1-2 tabs as needed every 5-6 hours for pain      . [DISCONTINUED] gabapentin (NEURONTIN) 300 MG capsule TAKE ONE CAPSULE BY MOUTH THREE TIMES DAILY  90 capsule  3  . [DISCONTINUED] ketoconazole (NIZORAL) 2 % cream Apply topically daily. To affected area daily.  60 g  1  .  [DISCONTINUED] PROAIR HFA 108 (90 BASE) MCG/ACT inhaler as needed.      . [DISCONTINUED] traMADol (ULTRAM) 50 MG tablet 1-2 tabs as needed every 6-8 hours for pain  180 tablet  2  . [DISCONTINUED] fluticasone (FLONASE) 50 MCG/ACT nasal spray 2 sprays by Nasal route daily.        . [DISCONTINUED] selenium sulfide (SELSUN) 2.5 % shampoo Apply topically 2 (two) times daily.  118 mL  1   No facility-administered encounter medications on file as of 10/28/2012.

## 2012-10-28 NOTE — Telephone Encounter (Signed)
Patient is at the front desk.  She was told she was given a flu shot a pheumonia shot and a tdap shot on 06-18-11.  She insists that she got one shot that day, a flu shot and has never gotten more than one shot at a time in this office.   Please advise the patient how soon she can have a pneumonia shot and how soon she could have a t dap shot.  [phone]

## 2012-10-28 NOTE — Patient Instructions (Signed)
No change in medications. Return as needed

## 2012-10-28 NOTE — Telephone Encounter (Signed)
Please advise? It does state the

## 2012-10-28 NOTE — Telephone Encounter (Signed)
Please advise? It does state that pt had these injections on these dates. It shows what arm injections were given in and complete documentation.

## 2012-10-28 NOTE — Telephone Encounter (Signed)
Looks like she got pneumonia and tdap together on same day in November and the flu shot in October 2012

## 2012-10-29 NOTE — Assessment & Plan Note (Signed)
History of sarcoidosis stage I with mediastinal lymph nodes positive for granulomas. Note current chest x-ray is negative for disease activity. Note pulmonary functions are normal including normal diffusion capacity and lung volumes. Note ACE level is normal. At this point the patient does not appear to have active sarcoidosis and does not require systemic therapy Plan Return as needed if symptoms recur No systemic therapy

## 2012-11-01 NOTE — Telephone Encounter (Signed)
Patient informed but is still stating that she did not get two shots in one day. Pt is ok with waiting until the chart is saying its due again. I apologized to pt and stated that we can only go by what the chart is telling us and its even documented stating which arm she received the injection in. Pt stated she understands

## 2012-11-03 ENCOUNTER — Encounter: Payer: Self-pay | Admitting: Critical Care Medicine

## 2012-11-23 ENCOUNTER — Ambulatory Visit: Payer: Medicare Other | Admitting: Family Medicine

## 2012-12-08 ENCOUNTER — Other Ambulatory Visit: Payer: Self-pay

## 2012-12-08 MED ORDER — HYDROCHLOROTHIAZIDE 25 MG PO TABS: 25.0000 mg | ORAL_TABLET | Freq: Every day | ORAL | Status: DC

## 2012-12-08 NOTE — Telephone Encounter (Signed)
Left a detailed message on pts answering machine that medication was sent to pharmacy

## 2012-12-10 ENCOUNTER — Encounter: Payer: Self-pay | Admitting: Family Medicine

## 2012-12-10 ENCOUNTER — Ambulatory Visit (INDEPENDENT_AMBULATORY_CARE_PROVIDER_SITE_OTHER): Payer: Medicare Other | Admitting: Family Medicine

## 2012-12-10 VITALS — BP 134/74 | HR 87 | Temp 98.4°F | Ht 64.0 in | Wt 184.1 lb

## 2012-12-10 DIAGNOSIS — M25569 Pain in unspecified knee: Secondary | ICD-10-CM

## 2012-12-10 DIAGNOSIS — M25562 Pain in left knee: Secondary | ICD-10-CM

## 2012-12-10 DIAGNOSIS — K219 Gastro-esophageal reflux disease without esophagitis: Secondary | ICD-10-CM

## 2012-12-10 DIAGNOSIS — I10 Essential (primary) hypertension: Secondary | ICD-10-CM | POA: Diagnosis not present

## 2012-12-10 DIAGNOSIS — M25559 Pain in unspecified hip: Secondary | ICD-10-CM

## 2012-12-10 DIAGNOSIS — E039 Hypothyroidism, unspecified: Secondary | ICD-10-CM | POA: Diagnosis not present

## 2012-12-10 DIAGNOSIS — E785 Hyperlipidemia, unspecified: Secondary | ICD-10-CM

## 2012-12-10 DIAGNOSIS — M25552 Pain in left hip: Secondary | ICD-10-CM

## 2012-12-10 MED ORDER — CELECOXIB 200 MG PO CAPS: 200.0000 mg | ORAL_CAPSULE | Freq: Every day | ORAL | Status: DC | PRN

## 2012-12-10 NOTE — Patient Instructions (Addendum)
Celebrex in am Gabapentin in pm Tramadol 1-2 tabs as needed twice a day Try the Dr Felicie Morn custom inserts Call if no pain relief and would like to see Ortho or podiatry Try Aspercreme  Labs prior to visit, lipid, renal, tsh, cbc, hepatic Next visit annual

## 2012-12-11 LAB — URIC ACID: Uric Acid, Serum: 6.8 mg/dL (ref 2.4–7.0)

## 2012-12-12 ENCOUNTER — Encounter: Payer: Self-pay | Admitting: Family Medicine

## 2012-12-12 NOTE — Progress Notes (Signed)
Patient ID: Kimberly Irwin, female   DOB: 1946/02/17, 67 y.o.   MRN: [id number] Kimberly Irwin [id number] 1946-03-30 12/12/2012      Progress Note-Follow Up  Subjective  Chief Complaint  Chief Complaint  Patient presents with  . Follow-up    2 month     HPI  This is a 67 year old female who is in today for followup. She was treated for bursitis in her left hip and her last visit with steroids and she did feel it helped slightly but didn't unfortunately not significantly. She sees orthopedics and will return there for further management. No new complaints. No chest pain, palpitations, shortness of breath, GI or GU complaints. She's been seen by pulmonology and actually reports her respiratory symptoms somewhat improved. 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 No complaints of reflux or acute complaints.  Past Medical History  Diagnosis Date  . Sarcoidosis   . Hyperlipidemia   . GERD (gastroesophageal reflux disease)   . Hypertension   . Thyroid disease     hypothyroidism  . Chronic low back pain   . DDD (degenerative disc disease)     L3-4 with facet arthropathy and stenosis  . Degenerative spondylolisthesis     L4-5 grade 1 with stenosis  . Hyperglycemia 04/04/2011  . Acute hip pain 10/26/2012  . Bursitis of left hip 10/26/2012    Past Surgical History  Procedure Laterality Date  . Carpal tunnel release    . Thyroidectomy    . Back surgery  01/15/09    L3-4 and L4-5 decompressive laminectomy with bilateral L3, L4, and L5 decompressive foraminotomies, more than it would be required for simple interbody fusion alone.  . Back surgery  01/15/09    L3-4 and L4-5 posterior lumbar interbody fusion utilizing tanget interbody allograft wedge, Telamon interbody PEEk cage, and local autografting.  . Back surgery  01/15/09    L3, L4, and L5 posterolateral arthrodesis using segmental pedicle screw fixation and local autografting.  . Joint replacement   09/13/09    Right Hip, Dr. Alvan Dame    Family History  Problem Relation Age of Onset  . Cancer Mother 73    Breast  . Heart disease Mother   . Hypertension Mother   . Hyperlipidemia Mother   . Heart attack Mother   . Asthma Maternal Grandmother     History   Social History  . Marital Status: Widowed    Spouse Name: N/A    Number of Children: 2  . Years of Education: N/A   Occupational History  . Not on file.   Social History Main Topics  . Smoking status: Former Smoker -- 0.50 packs/day for 32 years    Types: Cigarettes    Quit date: 08/04/1993  . Smokeless tobacco: Never Used  . Alcohol Use: No  . Drug Use: Not on file  . Sexually Active: Not on file   Other Topics Concern  . Not on file   Social History Narrative   Married 67 years and has 2 children (2 daughters)   Alcohol Use - no   Former Smoker - she quit 10 years ago, started when she was 41 and has had varying level of use of tobacco products from 1/2 pack to 3 packs per day.    Current Outpatient Prescriptions on File Prior to Visit  Medication Sig Dispense Refill  . albuterol (PROAIR HFA) 108 (90 BASE) MCG/ACT inhaler Inhale 2 puffs into the  lungs every 6 (six) hours as needed for wheezing.  1 Inhaler  6  . aspirin 81 MG tablet Take 81 mg by mouth daily.        . bisoprolol (ZEBETA) 5 MG tablet TAKE ONE TABLET BY MOUTH EVERY DAY  30 tablet  6  . Calcium Carbonate-Vitamin D (CALTRATE 600+D) 600-400 MG-UNIT per tablet Take 1 tablet by mouth 2 (two) times daily.      . Cholecalciferol (VITAMIN D) 1000 UNITS capsule Take 2 tablets by mouth at bedtime       . Cinnamon 500 MG capsule Take 2,000 mg by mouth daily.        Marland Kitchen ezetimibe-simvastatin (VYTORIN) 10-10 MG per tablet Take 1 tablet by mouth at bedtime.  30 tablet  5  . furosemide (LASIX) 20 MG tablet One by mouth in the morning as needed for leg swelling  30 tablet  1  . gabapentin (NEURONTIN) 300 MG capsule Take 300 mg by mouth. Every night and as needed  during the day      . hydrochlorothiazide (HYDRODIURIL) 25 MG tablet Take 1 tablet (25 mg total) by mouth daily.  30 tablet  3  . ketoconazole (NIZORAL) 2 % cream Apply topically as needed. To affected area daily.      Marland Kitchen levothyroxine (SYNTHROID, LEVOTHROID) 112 MCG tablet Take 1 tablet (112 mcg total) by mouth daily.  90 tablet  1  . Omega-3 Fatty Acids (FISH OIL) 1200 MG CAPS Take 2 capsules by mouth daily.        Marland Kitchen omeprazole (PRILOSEC) 20 MG capsule Take 20 mg by mouth at bedtime.       . traMADol (ULTRAM) 50 MG tablet 1-2 tabs as needed every 5-6 hours for pain       No current facility-administered medications on file prior to visit.    Allergies  Allergen Reactions  . Lipitor (Atorvastatin)   . Metronidazole   . Penicillins   . Pregabalin     REACTION: Somnolence and dizziness  . Rosuvastatin   . Simvastatin     Leg pain    Review of Systems  Review of Systems  Constitutional: Negative for fever and malaise/fatigue.  HENT: Negative for congestion.   Eyes: Negative for discharge.  Respiratory: Negative for shortness of breath.   Cardiovascular: Negative for chest pain, palpitations and leg swelling.  Gastrointestinal: Negative for nausea, abdominal pain and diarrhea.  Genitourinary: Negative for dysuria.  Musculoskeletal: Positive for joint pain. Negative for falls.  Skin: Negative for rash.  Neurological: Negative for loss of consciousness and headaches.  Endo/Heme/Allergies: Negative for polydipsia.  Psychiatric/Behavioral: Negative for depression and suicidal ideas. The patient is not nervous/anxious and does not have insomnia.     Objective  BP 134/74  Pulse 87  Temp(Src) 98.4 F (36.9 C) (Oral)  Ht _0  (1.626 m)  Wt 184 lb 1.3 oz (83.498 kg)  BMI 31.58 kg/m2  SpO2 89%  Physical Exam  Physical Exam  Constitutional: She is oriented to person, place, and time and well-developed, well-nourished, and in no distress. No distress.  HENT:  Head:  Normocephalic and atraumatic.  Eyes: Conjunctivae are normal.  Neck: Neck supple. No thyromegaly present.  Cardiovascular: Normal rate, regular rhythm and normal heart sounds.   No murmur heard. Pulmonary/Chest: Effort normal and breath sounds normal. She has no wheezes.  Abdominal: She exhibits no distension and no mass.  Musculoskeletal: She exhibits no edema.  Lymphadenopathy:    She has no cervical adenopathy.  Neurological: She is alert and oriented to person, place, and time.  Skin: Skin is warm and dry. No rash noted. She is not diaphoretic.  Psychiatric: Memory, affect and judgment normal.    Lab Results  Component Value Date   TSH 4.908* 07/21/2012   Lab Results  Component Value Date   WBC 15.3* 10/08/2010   HGB 17.0* 10/08/2010   HCT 48.9* 10/08/2010   MCV 85.8 10/08/2010   PLT 209 10/08/2010   Lab Results  Component Value Date   CREATININE 0.93 05/12/2012   BUN 15 05/12/2012   NA 140 05/12/2012   K 4.3 05/12/2012   CL 102 05/12/2012   CO2 27 05/12/2012   Lab Results  Component Value Date   ALT 34 01/07/2012   AST 37 01/07/2012   ALKPHOS 53 01/07/2012   BILITOT 0.7 01/07/2012   Lab Results  Component Value Date   CHOL 196 01/07/2012   Lab Results  Component Value Date   HDL 42 01/07/2012   Lab Results  Component Value Date   LDLCALC 111* 01/07/2012   Lab Results  Component Value Date   TRIG 213* 01/07/2012   Lab Results  Component Value Date   CHOLHDL 4.7 01/07/2012     Assessment & Plan  HYPERTENSION, CONTROLLED Well controlled no changes  Acute hip pain Worsening,   HYPOTHYROIDISM TSH mildly elevated but freeT4 is normal, will continue to monitor  GERD Generally well controlled on Omeprazole, avoid offending foods, small, frequent meals  HYPERLIPIDEMIA Avoid trans fats, minimize simple carbs and saturated fats. Continue Vytorin

## 2012-12-12 NOTE — Assessment & Plan Note (Signed)
Well controlled no changes 

## 2012-12-12 NOTE — Assessment & Plan Note (Signed)
Generally well controlled on Omeprazole, avoid offending foods, small, frequent meals

## 2012-12-12 NOTE — Assessment & Plan Note (Signed)
Avoid trans fats, minimize simple carbs and saturated fats. Continue Vytorin

## 2012-12-12 NOTE — Assessment & Plan Note (Signed)
TSH mildly elevated but freeT4 is normal, will continue to monitor

## 2012-12-12 NOTE — Assessment & Plan Note (Signed)
Worsening,

## 2012-12-14 DIAGNOSIS — H349 Unspecified retinal vascular occlusion: Secondary | ICD-10-CM | POA: Diagnosis not present

## 2012-12-15 ENCOUNTER — Encounter (INDEPENDENT_AMBULATORY_CARE_PROVIDER_SITE_OTHER): Payer: Medicare Other | Admitting: Ophthalmology

## 2012-12-15 DIAGNOSIS — I10 Essential (primary) hypertension: Secondary | ICD-10-CM | POA: Diagnosis not present

## 2012-12-15 DIAGNOSIS — H35039 Hypertensive retinopathy, unspecified eye: Secondary | ICD-10-CM

## 2012-12-15 DIAGNOSIS — H348392 Tributary (branch) retinal vein occlusion, unspecified eye, stable: Secondary | ICD-10-CM | POA: Diagnosis not present

## 2012-12-15 DIAGNOSIS — H43819 Vitreous degeneration, unspecified eye: Secondary | ICD-10-CM | POA: Diagnosis not present

## 2012-12-22 ENCOUNTER — Telehealth: Payer: Self-pay

## 2012-12-22 NOTE — Telephone Encounter (Signed)
So 770 is certainly not a significant number, very unlikely to be dangerous and 120 is perfectly normal, the latest studies on BP suggest we let bp run into the 140s as people age or we run the risk of dropping them too low and having a fall or passing out. She should probably come in for [place] to check her pressure before we decide and have her watch the sodium and the caffeine til seen, try the DASH diet

## 2012-12-22 NOTE — Telephone Encounter (Signed)
Pt informed. Pt will come in next week for nurse visit

## 2012-12-22 NOTE — Telephone Encounter (Signed)
Pt went to eye doctor a week ago Tuesday due to a blood vessel "bursting". He sent pt to Dr Verlin Fester who stated this is caused by high blood pressure? He told pt to call and get her BP medication adjusted so her BP will be 120/80, it was 138/80 (something) he stated this is to high. Dr West Pugh told pt she has to get this controlled or she's going to keep having blood vessels bursting. She said when she went to her regular eye doctor a week ago and her top number was 120 and pt stated that they never told her the bottom number? Please advise?

## 2012-12-24 ENCOUNTER — Other Ambulatory Visit: Payer: Self-pay | Admitting: Internal Medicine

## 2012-12-24 NOTE — Telephone Encounter (Signed)
Refill sent.

## 2012-12-29 ENCOUNTER — Ambulatory Visit (INDEPENDENT_AMBULATORY_CARE_PROVIDER_SITE_OTHER): Payer: Medicare Other | Admitting: Family Medicine

## 2012-12-29 VITALS — BP 128/82 | HR 54

## 2012-12-29 DIAGNOSIS — I10 Essential (primary) hypertension: Secondary | ICD-10-CM

## 2012-12-29 NOTE — Progress Notes (Signed)
  Subjective:    Patient ID: Kimberly Irwin, female    DOB: 07-13-1946, 67 y.o.   MRN: [id number]  HPI    Review of Systems     Objective:   Physical Exam        Assessment & Plan:  Patient came in today for a BP check.

## 2013-01-02 NOTE — Progress Notes (Signed)
Patient ID: Kimberly Irwin, female   DOB: 1946-01-20, 67 y.o.   MRN: [id number] Patient not seen by md

## 2013-01-12 ENCOUNTER — Encounter (INDEPENDENT_AMBULATORY_CARE_PROVIDER_SITE_OTHER): Payer: Medicare Other | Admitting: Ophthalmology

## 2013-01-12 DIAGNOSIS — H43819 Vitreous degeneration, unspecified eye: Secondary | ICD-10-CM

## 2013-01-12 DIAGNOSIS — I10 Essential (primary) hypertension: Secondary | ICD-10-CM | POA: Diagnosis not present

## 2013-01-12 DIAGNOSIS — H348392 Tributary (branch) retinal vein occlusion, unspecified eye, stable: Secondary | ICD-10-CM

## 2013-01-12 DIAGNOSIS — H35039 Hypertensive retinopathy, unspecified eye: Secondary | ICD-10-CM | POA: Diagnosis not present

## 2013-02-09 ENCOUNTER — Other Ambulatory Visit: Payer: Self-pay

## 2013-02-09 DIAGNOSIS — E785 Hyperlipidemia, unspecified: Secondary | ICD-10-CM | POA: Diagnosis not present

## 2013-02-09 DIAGNOSIS — R739 Hyperglycemia, unspecified: Secondary | ICD-10-CM

## 2013-02-09 DIAGNOSIS — E039 Hypothyroidism, unspecified: Secondary | ICD-10-CM

## 2013-02-09 DIAGNOSIS — I10 Essential (primary) hypertension: Secondary | ICD-10-CM | POA: Diagnosis not present

## 2013-02-09 DIAGNOSIS — R7309 Other abnormal glucose: Secondary | ICD-10-CM | POA: Diagnosis not present

## 2013-02-09 LAB — HEPATIC FUNCTION PANEL
AST: 29 U/L (ref 0–37)
Albumin: 4.1 g/dL (ref 3.5–5.2)
Alkaline Phosphatase: 52 U/L (ref 39–117)
Indirect Bilirubin: 0.6 mg/dL (ref 0.0–0.9)
Total Protein: 6.8 g/dL (ref 6.0–8.3)

## 2013-02-09 LAB — CBC
HCT: 42.5 % (ref 36.0–46.0)
Hemoglobin: 14.8 g/dL (ref 12.0–15.0)
MCH: 29.4 pg (ref 26.0–34.0)
MCHC: 34.8 g/dL (ref 30.0–36.0)

## 2013-02-09 LAB — RENAL FUNCTION PANEL
BUN: 14 mg/dL (ref 6–23)
CO2: 25 mEq/L (ref 19–32)
Chloride: 101 mEq/L (ref 96–112)
Glucose, Bld: 111 mg/dL — ABNORMAL HIGH (ref 70–99)
Potassium: 4.1 mEq/L (ref 3.5–5.3)

## 2013-02-09 LAB — LIPID PANEL: Cholesterol: 188 mg/dL (ref 0–200)

## 2013-02-09 MED ORDER — GABAPENTIN 300 MG PO CAPS: ORAL_CAPSULE | ORAL | Status: DC

## 2013-02-09 MED ORDER — TRAMADOL HCL 50 MG PO TABS: ORAL_TABLET | ORAL | Status: DC

## 2013-02-09 NOTE — Telephone Encounter (Signed)
Pt walked in and needs Gabapentin and Tramadol refilled. 1 month RX sent to pharmacy  Also lab order placed

## 2013-02-11 ENCOUNTER — Encounter (INDEPENDENT_AMBULATORY_CARE_PROVIDER_SITE_OTHER): Payer: Medicare Other | Admitting: Ophthalmology

## 2013-02-11 ENCOUNTER — Ambulatory Visit: Payer: Medicare Other | Admitting: Family Medicine

## 2013-02-11 DIAGNOSIS — H348392 Tributary (branch) retinal vein occlusion, unspecified eye, stable: Secondary | ICD-10-CM

## 2013-02-11 DIAGNOSIS — H43819 Vitreous degeneration, unspecified eye: Secondary | ICD-10-CM

## 2013-02-11 DIAGNOSIS — I10 Essential (primary) hypertension: Secondary | ICD-10-CM

## 2013-02-11 DIAGNOSIS — H35039 Hypertensive retinopathy, unspecified eye: Secondary | ICD-10-CM | POA: Diagnosis not present

## 2013-02-18 ENCOUNTER — Encounter: Payer: Self-pay | Admitting: Family Medicine

## 2013-02-18 ENCOUNTER — Ambulatory Visit (INDEPENDENT_AMBULATORY_CARE_PROVIDER_SITE_OTHER): Payer: Medicare Other | Admitting: Family Medicine

## 2013-02-18 VITALS — BP 118/72 | HR 71 | Temp 98.4°F | Ht 64.0 in | Wt 185.0 lb

## 2013-02-18 DIAGNOSIS — M25569 Pain in unspecified knee: Secondary | ICD-10-CM

## 2013-02-18 DIAGNOSIS — M25562 Pain in left knee: Secondary | ICD-10-CM

## 2013-02-18 DIAGNOSIS — M76899 Other specified enthesopathies of unspecified lower limb, excluding foot: Secondary | ICD-10-CM

## 2013-02-18 DIAGNOSIS — E039 Hypothyroidism, unspecified: Secondary | ICD-10-CM

## 2013-02-18 DIAGNOSIS — E785 Hyperlipidemia, unspecified: Secondary | ICD-10-CM

## 2013-02-18 DIAGNOSIS — R739 Hyperglycemia, unspecified: Secondary | ICD-10-CM

## 2013-02-18 DIAGNOSIS — R7309 Other abnormal glucose: Secondary | ICD-10-CM

## 2013-02-18 DIAGNOSIS — M7072 Other bursitis of hip, left hip: Secondary | ICD-10-CM

## 2013-02-18 DIAGNOSIS — I10 Essential (primary) hypertension: Secondary | ICD-10-CM

## 2013-02-18 DIAGNOSIS — H547 Unspecified visual loss: Secondary | ICD-10-CM

## 2013-02-18 NOTE — Assessment & Plan Note (Signed)
Check a hgba1c today,a void simple carbs

## 2013-02-18 NOTE — Assessment & Plan Note (Signed)
Chronic pain in lle, especially from knee down to foot recently.

## 2013-02-18 NOTE — Patient Instructions (Addendum)
Stop Vytorin for one month then restart Crestor 5 mg daily with CoQ10 enzyme daily Salon Pas patch or cream      Cholesterol Cholesterol is a white, waxy, fat-like protein needed by your body in small amounts. The liver makes all the cholesterol you need. It is carried from the liver by the blood through the blood vessels. Deposits (plaque) may build up on blood vessel walls. This makes the arteries narrower and stiffer. Plaque increases the risk for heart attack and stroke. You cannot feel your cholesterol level even if it is very high. The only way to know is by a blood test to check your lipid (fats) levels. Once you know your cholesterol levels, you should keep a record of the test results. Work with your caregiver to to keep your levels in the desired range. WHAT THE RESULTS MEAN:  Total cholesterol is a rough measure of all the cholesterol in your blood.  LDL is the so-called bad cholesterol. This is the type that deposits cholesterol in the walls of the arteries. You want this level to be low.  HDL is the good cholesterol because it cleans the arteries and carries the LDL away. You want this level to be high.  Triglycerides are fat that the body can either burn for energy or store. High levels are closely linked to heart disease. DESIRED LEVELS:  Total cholesterol below 200.  LDL below 100 for people at risk, below 70 for very high risk.  HDL above 50 is good, above 60 is best.  Triglycerides below 150. HOW TO LOWER YOUR CHOLESTEROL:  Diet.  Choose fish or white meat chicken and [place], roasted or baked. Limit fatty cuts of red meat, fried foods, and processed meats, such as sausage and lunch meat.  Eat lots of fresh fruits and vegetables. Choose whole grains, beans, pasta, potatoes and cereals.  Use only small amounts of olive, corn or canola oils. Avoid butter, mayonnaise, shortening or palm kernel oils. Avoid foods with trans-fats.  Use skim/nonfat milk and  low-fat/nonfat yogurt and cheeses. Avoid whole milk, cream, ice cream, egg yolks and cheeses. Healthy desserts include angel food cake, ginger snaps, animal crackers, hard candy, popsicles, and low-fat/nonfat frozen yogurt. Avoid pastries, cakes, pies and cookies.  Exercise.  A regular program helps decrease LDL and raises HDL.  Helps with weight control.  Do things that increase your activity level like gardening, walking, or taking the stairs.  Medication.  May be prescribed by your caregiver to help lowering cholesterol and the risk for heart disease.  You may need medicine even if your levels are normal if you have several risk factors. HOME CARE INSTRUCTIONS   Follow your diet and exercise programs as suggested by your caregiver.  Take medications as directed.  Have blood work done when your caregiver feels it is necessary. MAKE SURE YOU:   Understand these instructions.  Will watch your condition.  Will get help right away if you are not doing well or get worse. Document Released: 04/15/2001 Document Revised: 10/13/2011 Document Reviewed: 10/06/2007 [facility] Patient Information 2014 [place], [place].

## 2013-02-19 ENCOUNTER — Encounter: Payer: Self-pay | Admitting: Family Medicine

## 2013-02-19 DIAGNOSIS — H547 Unspecified visual loss: Secondary | ICD-10-CM | POA: Insufficient documentation

## 2013-02-19 NOTE — Assessment & Plan Note (Signed)
Hip to knee, try Salon Pas cream or patches. Maintain activity as tolerated

## 2013-02-19 NOTE — Assessment & Plan Note (Signed)
Well controlled on current dose of Levothyroxine.

## 2013-02-19 NOTE — Assessment & Plan Note (Signed)
Well controlled, no changes 

## 2013-02-19 NOTE — Progress Notes (Signed)
Patient ID: Kimberly Irwin, female   DOB: 1945-11-02, 67 y.o.   MRN: [id number] Kimberly Irwin [id number] 1946-03-04 02/19/2013      Progress Note-Follow Up  Subjective  Chief Complaint  Chief Complaint  Patient presents with  . Annual Exam    physical    HPI  Patient is a 67 year old Caucasian female who is here today for routine medical care. She generally feels well but she is noting increased myalgias and arthralgias on Vytorin. Pain is most significant from the left hip and left knee that can be diffuse. No recent numbness. Continues to struggle with decreasing visual acuity is following closely with ophthalmology. No chest pain no palpitations, shortness of breath, GI or GU complaints and is taking medications as prescribed  Past Medical History  Diagnosis Date  . Sarcoidosis   . Hyperlipidemia   . GERD (gastroesophageal reflux disease)   . Hypertension   . Thyroid disease     hypothyroidism  . Chronic low back pain   . DDD (degenerative disc disease)     L3-4 with facet arthropathy and stenosis  . Degenerative spondylolisthesis     L4-5 grade 1 with stenosis  . Hyperglycemia 04/04/2011  . Acute hip pain 10/26/2012  . Bursitis of left hip 10/26/2012  . Pain in joint, lower leg 10/26/2012    left   . Decreased visual acuity 02/19/2013    Past Surgical History  Procedure Laterality Date  . Carpal tunnel release    . Thyroidectomy    . Back surgery  01/15/09    L3-4 and L4-5 decompressive laminectomy with bilateral L3, L4, and L5 decompressive foraminotomies, more than it would be required for simple interbody fusion alone.  . Back surgery  01/15/09    L3-4 and L4-5 posterior lumbar interbody fusion utilizing tanget interbody allograft wedge, Telamon interbody PEEk cage, and local autografting.  . Back surgery  01/15/09    L3, L4, and L5 posterolateral arthrodesis using segmental pedicle screw fixation and local autografting.  . Joint replacement  09/13/09    Right Hip, Dr. Alvan Dame     Family History  Problem Relation Age of Onset  . Cancer Mother 88    Breast  . Heart disease Mother   . Hypertension Mother   . Hyperlipidemia Mother   . Heart attack Mother   . Asthma Maternal Grandmother     History   Social History  . Marital Status: Widowed    Spouse Name: N/A    Number of Children: 2  . Years of Education: N/A   Occupational History  . Not on file.   Social History Main Topics  . Smoking status: Former Smoker -- 0.50 packs/day for 32 years    Types: Cigarettes    Quit date: 08/04/1993  . Smokeless tobacco: Never Used  . Alcohol Use: No  . Drug Use: Not on file  . Sexually Active: Not on file   Other Topics Concern  . Not on file   Social History Narrative   Married 17 years and has 2 children (2 daughters)   Alcohol Use - no   Former Smoker - she quit 10 years ago, started when she was 31 and has had varying level of use of tobacco products from 1/2 pack to 3 packs per day.    Current Outpatient Prescriptions on File Prior to Visit  Medication Sig Dispense Refill  . albuterol (PROAIR HFA) 108 (90 BASE) MCG/ACT inhaler Inhale 2 puffs into the lungs every 6 (  six) hours as needed for wheezing.  1 Inhaler  6  . aspirin 81 MG tablet Take 81 mg by mouth daily.        . bisoprolol (ZEBETA) 5 MG tablet TAKE ONE TABLET BY MOUTH EVERY DAY  30 tablet  3  . Calcium Carbonate-Vitamin D (CALTRATE 600+D) 600-400 MG-UNIT per tablet Take 1 tablet by mouth 2 (two) times daily.      . celecoxib (CELEBREX) 200 MG capsule Take 1 capsule (200 mg total) by mouth daily as needed.  30 capsule  3  . Cholecalciferol (VITAMIN D) 1000 UNITS capsule Take 2 tablets by mouth at bedtime       . Cinnamon 500 MG capsule Take 2,000 mg by mouth daily.        Marland Kitchen ezetimibe-simvastatin (VYTORIN) 10-10 MG per tablet Take 1 tablet by mouth at bedtime.  30 tablet  5  . gabapentin (NEURONTIN) 300 MG capsule Every night and as needed during the day  90 capsule  0  .  hydrochlorothiazide (HYDRODIURIL) 25 MG tablet Take 1 tablet (25 mg total) by mouth daily.  30 tablet  3  . ketoconazole (NIZORAL) 2 % cream Apply topically as needed. To affected area daily.      Marland Kitchen levothyroxine (SYNTHROID, LEVOTHROID) 112 MCG tablet Take 1 tablet (112 mcg total) by mouth daily.  90 tablet  1  . Omega-3 Fatty Acids (FISH OIL) 1200 MG CAPS Take 2 capsules by mouth daily.        Marland Kitchen omeprazole (PRILOSEC) 20 MG capsule Take 20 mg by mouth at bedtime.       . traMADol (ULTRAM) 50 MG tablet 1-2 tabs as needed every 5-6 hours for pain  30 tablet  0   No current facility-administered medications on file prior to visit.    Allergies  Allergen Reactions  . Lipitor (Atorvastatin)   . Metronidazole   . Penicillins   . Pregabalin     REACTION: Somnolence and dizziness  . Simvastatin     Leg pain    Review of Systems  Review of Systems  Constitutional: Negative for fever and malaise/fatigue.  HENT: Negative for congestion.   Eyes: Negative for pain and discharge.  Respiratory: Negative for shortness of breath.   Cardiovascular: Negative for chest pain, palpitations and leg swelling.  Gastrointestinal: Negative for nausea, abdominal pain and diarrhea.  Genitourinary: Negative for dysuria.  Musculoskeletal: Positive for joint pain. Negative for falls.  Skin: Negative for rash.  Neurological: Negative for loss of consciousness and headaches.  Endo/Heme/Allergies: Negative for polydipsia.  Psychiatric/Behavioral: Negative for depression and suicidal ideas. The patient is not nervous/anxious and does not have insomnia.     Objective  BP 118/72  Pulse 71  Temp(Src) 98.4 F (36.9 C) (Oral)  Ht _0  (1.626 m)  Wt 185 lb (83.915 kg)  BMI 31.74 kg/m2  SpO2 93%  Physical Exam  Physical Exam  Constitutional: She is oriented to person, place, and time and well-developed, well-nourished, and in no distress. No distress.  HENT:  Head: Normocephalic and atraumatic.  Eyes:  Conjunctivae are normal.  Neck: Neck supple. No thyromegaly present.  Cardiovascular: Normal rate and regular rhythm.  Exam reveals no gallop.   No murmur heard. Pulmonary/Chest: Effort normal and breath sounds normal. She has no wheezes.  Abdominal: She exhibits no distension and no mass.  Musculoskeletal: She exhibits tenderness. She exhibits no edema.  Lymphadenopathy:    She has no cervical adenopathy.  Neurological: She is alert  and oriented to person, place, and time.  Skin: Skin is warm and dry. No rash noted. She is not diaphoretic.  Psychiatric: Memory, affect and judgment normal.    Lab Results  Component Value Date   TSH 3.922 02/09/2013   Lab Results  Component Value Date   WBC 7.9 02/09/2013   HGB 14.8 02/09/2013   HCT 42.5 02/09/2013   MCV 84.5 02/09/2013   PLT 207 02/09/2013   Lab Results  Component Value Date   CREATININE 0.91 02/09/2013   BUN 14 02/09/2013   NA 138 02/09/2013   K 4.1 02/09/2013   CL 101 02/09/2013   CO2 25 02/09/2013   Lab Results  Component Value Date   ALT 30 02/09/2013   AST 29 02/09/2013   ALKPHOS 52 02/09/2013   BILITOT 0.7 02/09/2013   Lab Results  Component Value Date   CHOL 188 02/09/2013   Lab Results  Component Value Date   HDL 41 02/09/2013   Lab Results  Component Value Date   LDLCALC 112* 02/09/2013   Lab Results  Component Value Date   TRIG 176* 02/09/2013   Lab Results  Component Value Date   CHOLHDL 4.6 02/09/2013     Assessment & Plan  Bursitis of left hip Chronic pain in lle, especially from knee down to foot recently.   Hyperglycemia Check a hgba1c today,a void simple carbs  HYPERLIPIDEMIA Will stop Vytroin due to pain, given samples to start of Crestor 76m daily along with coQ10. Avoid trans fats, take krill oil caps  HYPERTENSION, CONTROLLED Well controlled, no changes  HYPOTHYROIDISM Well controlled on current dose of Levothyroxine.  Pain in joint, lower leg Hip to knee, try Salon Pas cream or patches. Maintain activity  as tolerated

## 2013-02-19 NOTE — Assessment & Plan Note (Signed)
Will stop Vytroin due to pain, given samples to start of Crestor 84m daily along with coQ10. Avoid trans fats, take krill oil caps

## 2013-03-11 ENCOUNTER — Encounter (INDEPENDENT_AMBULATORY_CARE_PROVIDER_SITE_OTHER): Payer: Medicare Other | Admitting: Ophthalmology

## 2013-03-11 DIAGNOSIS — M25569 Pain in unspecified knee: Secondary | ICD-10-CM | POA: Diagnosis not present

## 2013-03-11 DIAGNOSIS — I10 Essential (primary) hypertension: Secondary | ICD-10-CM | POA: Diagnosis not present

## 2013-03-11 DIAGNOSIS — H348392 Tributary (branch) retinal vein occlusion, unspecified eye, stable: Secondary | ICD-10-CM

## 2013-03-11 DIAGNOSIS — H35039 Hypertensive retinopathy, unspecified eye: Secondary | ICD-10-CM

## 2013-03-11 DIAGNOSIS — H43819 Vitreous degeneration, unspecified eye: Secondary | ICD-10-CM

## 2013-03-16 ENCOUNTER — Telehealth: Payer: Self-pay | Admitting: *Deleted

## 2013-03-16 DIAGNOSIS — I10 Essential (primary) hypertension: Secondary | ICD-10-CM | POA: Diagnosis not present

## 2013-03-16 DIAGNOSIS — E785 Hyperlipidemia, unspecified: Secondary | ICD-10-CM

## 2013-03-16 LAB — HEPATIC FUNCTION PANEL
ALT: 34 U/L (ref 0–35)
AST: 30 U/L (ref 0–37)
Alkaline Phosphatase: 61 U/L (ref 39–117)
Bilirubin, Direct: 0.1 mg/dL (ref 0.0–0.3)

## 2013-03-16 NOTE — Telephone Encounter (Signed)
Pt presented to the lab for blood work and states it was written on her last check out sheet to return for labs prior to next appt.  Please advise what should be ordered?

## 2013-03-16 NOTE — Telephone Encounter (Signed)
Hepatic panel for htn, hyperlipid

## 2013-03-18 ENCOUNTER — Other Ambulatory Visit: Payer: Self-pay | Admitting: *Deleted

## 2013-03-18 MED ORDER — LEVOTHYROXINE SODIUM 112 MCG PO TABS: 112.0000 ug | ORAL_TABLET | Freq: Every day | ORAL | Status: DC

## 2013-03-18 NOTE — Telephone Encounter (Signed)
Rx request to pharmacy/SLS  

## 2013-04-01 ENCOUNTER — Other Ambulatory Visit: Payer: Self-pay | Admitting: Family Medicine

## 2013-04-06 ENCOUNTER — Other Ambulatory Visit: Payer: Self-pay

## 2013-04-06 MED ORDER — ROSUVASTATIN CALCIUM 5 MG PO TABS: 5.0000 mg | ORAL_TABLET | Freq: Every day | ORAL | Status: DC

## 2013-04-06 MED ORDER — TRAMADOL HCL 50 MG PO TABS: ORAL_TABLET | ORAL | Status: DC

## 2013-04-06 NOTE — Telephone Encounter (Signed)
Please advise Tramadol refill? Last RX was done on 02-09-13 quantity 30 with 0 refills  If ok fax to [phone]

## 2013-04-07 NOTE — Telephone Encounter (Signed)
RX faxed

## 2013-04-11 ENCOUNTER — Encounter (INDEPENDENT_AMBULATORY_CARE_PROVIDER_SITE_OTHER): Payer: Medicare Other | Admitting: Ophthalmology

## 2013-04-11 DIAGNOSIS — I10 Essential (primary) hypertension: Secondary | ICD-10-CM | POA: Diagnosis not present

## 2013-04-11 DIAGNOSIS — H43819 Vitreous degeneration, unspecified eye: Secondary | ICD-10-CM

## 2013-04-11 DIAGNOSIS — H348392 Tributary (branch) retinal vein occlusion, unspecified eye, stable: Secondary | ICD-10-CM | POA: Diagnosis not present

## 2013-04-11 DIAGNOSIS — H35039 Hypertensive retinopathy, unspecified eye: Secondary | ICD-10-CM

## 2013-04-25 ENCOUNTER — Other Ambulatory Visit: Payer: Self-pay | Admitting: *Deleted

## 2013-04-25 MED ORDER — BISOPROLOL FUMARATE 5 MG PO TABS: ORAL_TABLET | ORAL | Status: DC

## 2013-04-25 NOTE — Telephone Encounter (Signed)
Rx request to pharmacy/SLS  

## 2013-04-26 ENCOUNTER — Telehealth: Payer: Self-pay | Admitting: *Deleted

## 2013-04-26 MED ORDER — GABAPENTIN 300 MG PO CAPS: ORAL_CAPSULE | ORAL | Status: DC

## 2013-04-26 NOTE — Telephone Encounter (Signed)
Faxed refill request received from pharmacy for Gabapentin  Last filled by MD on 07.09.14, #[id number] Last AEX - 07.18.14 Next AEX - 4 Months Please Advise/SLS

## 2013-04-26 NOTE — Telephone Encounter (Signed)
RX sent. Pt is scheduled to come in on 05-23-14

## 2013-04-26 NOTE — Telephone Encounter (Signed)
OK to provide refill on Gabapentin with same sig, same strength, #90 no rf but she will need appt for more.

## 2013-05-05 ENCOUNTER — Other Ambulatory Visit: Payer: Self-pay | Admitting: *Deleted

## 2013-05-05 MED ORDER — BISOPROLOL FUMARATE 5 MG PO TABS: ORAL_TABLET | ORAL | Status: DC

## 2013-05-05 NOTE — Progress Notes (Signed)
A user error has taken place: medication ordered in error to wrong pharmacy, not dispensed to this patient; corrected/SLS

## 2013-05-11 ENCOUNTER — Encounter (INDEPENDENT_AMBULATORY_CARE_PROVIDER_SITE_OTHER): Payer: Medicare Other | Admitting: Ophthalmology

## 2013-05-11 DIAGNOSIS — H348392 Tributary (branch) retinal vein occlusion, unspecified eye, stable: Secondary | ICD-10-CM

## 2013-05-11 DIAGNOSIS — H43819 Vitreous degeneration, unspecified eye: Secondary | ICD-10-CM

## 2013-05-11 DIAGNOSIS — I10 Essential (primary) hypertension: Secondary | ICD-10-CM

## 2013-05-11 DIAGNOSIS — H35039 Hypertensive retinopathy, unspecified eye: Secondary | ICD-10-CM

## 2013-05-16 ENCOUNTER — Telehealth: Payer: Self-pay

## 2013-05-16 DIAGNOSIS — E785 Hyperlipidemia, unspecified: Secondary | ICD-10-CM

## 2013-05-16 DIAGNOSIS — R739 Hyperglycemia, unspecified: Secondary | ICD-10-CM

## 2013-05-16 DIAGNOSIS — E039 Hypothyroidism, unspecified: Secondary | ICD-10-CM

## 2013-05-16 DIAGNOSIS — I10 Essential (primary) hypertension: Secondary | ICD-10-CM | POA: Diagnosis not present

## 2013-05-16 DIAGNOSIS — R7309 Other abnormal glucose: Secondary | ICD-10-CM | POA: Diagnosis not present

## 2013-05-16 LAB — RENAL FUNCTION PANEL
Albumin: 4.3 g/dL (ref 3.5–5.2)
CO2: 30 mEq/L (ref 19–32)
Calcium: 9.9 mg/dL (ref 8.4–10.5)
Creat: 0.81 mg/dL (ref 0.50–1.10)
Phosphorus: 3.5 mg/dL (ref 2.3–4.6)
Sodium: 141 mEq/L (ref 135–145)

## 2013-05-16 LAB — HEMOGLOBIN A1C: Hgb A1c MFr Bld: 5.8 % — ABNORMAL HIGH (ref <5.7)

## 2013-05-16 LAB — LIPID PANEL
HDL: 43 mg/dL (ref >39)
LDL Cholesterol: 119 mg/dL — ABNORMAL HIGH (ref 0–99)
VLDL: 40 mg/dL (ref 0–40)

## 2013-05-16 LAB — CBC
MCV: 85.9 fL (ref 78.0–100.0)
Platelets: 211 10*3/uL (ref 150–400)
RBC: 4.91 MIL/uL (ref 3.87–5.11)
RDW: 13.5 % (ref 11.5–15.5)
WBC: 8.6 10*3/uL (ref 4.0–10.5)

## 2013-05-16 LAB — HEPATIC FUNCTION PANEL
Bilirubin, Direct: 0.1 mg/dL (ref 0.0–0.3)
Indirect Bilirubin: 0.6 mg/dL (ref 0.0–0.9)
Total Bilirubin: 0.7 mg/dL (ref 0.3–1.2)

## 2013-05-16 NOTE — Telephone Encounter (Signed)
Patient came in today for labs? I don't see where you wanted anything ordered?  Please advise?

## 2013-05-16 NOTE — Telephone Encounter (Signed)
Hgba1c, lipid, renal, cbc, tsh, hepatic

## 2013-05-17 LAB — TSH: TSH: 1.381 u[IU]/mL (ref 0.350–4.500)

## 2013-05-20 DIAGNOSIS — Z1231 Encounter for screening mammogram for malignant neoplasm of breast: Secondary | ICD-10-CM | POA: Diagnosis not present

## 2013-05-20 DIAGNOSIS — M25559 Pain in unspecified hip: Secondary | ICD-10-CM | POA: Diagnosis not present

## 2013-05-20 LAB — HM MAMMOGRAPHY: HM Mammogram: NORMAL

## 2013-05-23 ENCOUNTER — Ambulatory Visit (INDEPENDENT_AMBULATORY_CARE_PROVIDER_SITE_OTHER): Payer: Medicare Other | Admitting: Family Medicine

## 2013-05-23 ENCOUNTER — Encounter: Payer: Self-pay | Admitting: Family Medicine

## 2013-05-23 ENCOUNTER — Other Ambulatory Visit: Payer: Self-pay | Admitting: Family Medicine

## 2013-05-23 VITALS — BP 120/86 | HR 63 | Temp 97.8°F | Ht 64.0 in | Wt 184.0 lb

## 2013-05-23 DIAGNOSIS — E785 Hyperlipidemia, unspecified: Secondary | ICD-10-CM | POA: Diagnosis not present

## 2013-05-23 DIAGNOSIS — M76899 Other specified enthesopathies of unspecified lower limb, excluding foot: Secondary | ICD-10-CM

## 2013-05-23 DIAGNOSIS — I10 Essential (primary) hypertension: Secondary | ICD-10-CM | POA: Diagnosis not present

## 2013-05-23 DIAGNOSIS — M25562 Pain in left knee: Secondary | ICD-10-CM

## 2013-05-23 DIAGNOSIS — R7309 Other abnormal glucose: Secondary | ICD-10-CM

## 2013-05-23 DIAGNOSIS — R52 Pain, unspecified: Secondary | ICD-10-CM

## 2013-05-23 DIAGNOSIS — Z23 Encounter for immunization: Secondary | ICD-10-CM

## 2013-05-23 DIAGNOSIS — R739 Hyperglycemia, unspecified: Secondary | ICD-10-CM

## 2013-05-23 DIAGNOSIS — E039 Hypothyroidism, unspecified: Secondary | ICD-10-CM | POA: Diagnosis not present

## 2013-05-23 DIAGNOSIS — M549 Dorsalgia, unspecified: Secondary | ICD-10-CM

## 2013-05-23 DIAGNOSIS — M25569 Pain in unspecified knee: Secondary | ICD-10-CM

## 2013-05-23 DIAGNOSIS — M7072 Other bursitis of hip, left hip: Secondary | ICD-10-CM

## 2013-05-23 DIAGNOSIS — K219 Gastro-esophageal reflux disease without esophagitis: Secondary | ICD-10-CM

## 2013-05-23 MED ORDER — ROSUVASTATIN CALCIUM 10 MG PO TABS: 10.0000 mg | ORAL_TABLET | Freq: Every day | ORAL | Status: DC

## 2013-05-23 MED ORDER — TRAMADOL HCL 50 MG PO TABS: ORAL_TABLET | ORAL | Status: DC

## 2013-05-23 NOTE — Progress Notes (Signed)
Patient ID: Kimberly Irwin, female   DOB: 1946-05-03, 67 y.o.   MRN: [id number] Kimberly Irwin [id number] 05-03-1946 05/23/2013      Progress Note-Follow Up  Subjective  Chief Complaint  Chief Complaint  Patient presents with  . Follow-up    3 month  . Injections    high- flu    HPI  Patient is a 67 year old female who is in today for routine followup. Her largest complaints are orthopedic. She has chronic hip pain. She also has chronic low back pain. She suffers with loss of visual acuity. No CP, palp, Shortness of breath, GI or GU complaints at this time. Taking medications as prescribed.No recent illness. No headaches, congestion   Past Medical History  Diagnosis Date  . Sarcoidosis   . Hyperlipidemia   . GERD (gastroesophageal reflux disease)   . Hypertension   . Thyroid disease     hypothyroidism  . Chronic low back pain   . DDD (degenerative disc disease)     L3-4 with facet arthropathy and stenosis  . Degenerative spondylolisthesis     L4-5 grade 1 with stenosis  . Hyperglycemia 04/04/2011  . Acute hip pain 10/26/2012  . Bursitis of left hip 10/26/2012  . Pain in joint, lower leg 10/26/2012    left   . Decreased visual acuity 02/19/2013    Past Surgical History  Procedure Laterality Date  . Carpal tunnel release    . Thyroidectomy    . Back surgery  01/15/09    L3-4 and L4-5 decompressive laminectomy with bilateral L3, L4, and L5 decompressive foraminotomies, more than it would be required for simple interbody fusion alone.  . Back surgery  01/15/09    L3-4 and L4-5 posterior lumbar interbody fusion utilizing tanget interbody allograft wedge, Telamon interbody PEEk cage, and local autografting.  . Back surgery  01/15/09    L3, L4, and L5 posterolateral arthrodesis using segmental pedicle screw fixation and local autografting.  . Joint replacement  09/13/09    Right Hip, Dr. Alvan Dame    Family History  Problem Relation Age of Onset  . Cancer Mother 5    Breast  . Heart  disease Mother   . Hypertension Mother   . Hyperlipidemia Mother   . Heart attack Mother   . Asthma Maternal Grandmother     History   Social History  . Marital Status: Widowed    Spouse Name: N/A    Number of Children: 2  . Years of Education: N/A   Occupational History  . Not on file.   Social History Main Topics  . Smoking status: Former Smoker -- 0.50 packs/day for 32 years    Types: Cigarettes    Quit date: 08/04/1993  . Smokeless tobacco: Never Used  . Alcohol Use: No  . Drug Use: Not on file  . Sexual Activity: Not on file   Other Topics Concern  . Not on file   Social History Narrative   Married 39 years and has 2 children (2 daughters)   Alcohol Use - no   Former Smoker - she quit 10 years ago, started when she was 2 and has had varying level of use of tobacco products from 1/2 pack to 3 packs per day.    Current Outpatient Prescriptions on File Prior to Visit  Medication Sig Dispense Refill  . albuterol (PROAIR HFA) 108 (90 BASE) MCG/ACT inhaler Inhale 2 puffs into the lungs every 6 (six) hours as needed for wheezing.  1 Inhaler  6  . aspirin 81 MG tablet Take 81 mg by mouth daily.        . bisoprolol (ZEBETA) 5 MG tablet TAKE ONE TABLET BY MOUTH EVERY DAY  30 tablet  3  . Calcium Carbonate-Vitamin D (CALTRATE 600+D) 600-400 MG-UNIT per tablet Take 1 tablet by mouth 2 (two) times daily.      . celecoxib (CELEBREX) 200 MG capsule Take 1 capsule (200 mg total) by mouth daily as needed.  30 capsule  3  . Cholecalciferol (VITAMIN D) 1000 UNITS capsule Take 2 tablets by mouth at bedtime       . Cinnamon 500 MG capsule Take 2,000 mg by mouth daily.        Marland Kitchen ezetimibe-simvastatin (VYTORIN) 10-10 MG per tablet Take 1 tablet by mouth at bedtime.  30 tablet  5  . gabapentin (NEURONTIN) 300 MG capsule Every night and as needed during the day  90 capsule  0  . hydrochlorothiazide (HYDRODIURIL) 25 MG tablet Take 1 tablet (25 mg total) by mouth daily.  30 tablet  5  .  ketoconazole (NIZORAL) 2 % cream Apply topically as needed. To affected area daily.      Marland Kitchen levothyroxine (SYNTHROID, LEVOTHROID) 112 MCG tablet Take 1 tablet (112 mcg total) by mouth daily.  90 tablet  0  . Omega-3 Fatty Acids (FISH OIL) 1200 MG CAPS Take 2 capsules by mouth daily.        . rosuvastatin (CRESTOR) 5 MG tablet Take 1 tablet (5 mg total) by mouth daily.  30 tablet  3  . traMADol (ULTRAM) 50 MG tablet 1-2 tabs as needed every 5-6 hours for pain  30 tablet  0   No current facility-administered medications on file prior to visit.    Allergies  Allergen Reactions  . Lipitor [Atorvastatin]   . Metronidazole   . Penicillins   . Pregabalin     REACTION: Somnolence and dizziness  . Simvastatin     Leg pain    Review of Systems  Review of Systems  Constitutional: Negative for fever and malaise/fatigue.  HENT: Negative for congestion.   Eyes: Negative for discharge.  Respiratory: Negative for shortness of breath.   Cardiovascular: Negative for chest pain, palpitations and leg swelling.  Gastrointestinal: Negative for nausea, abdominal pain and diarrhea.  Genitourinary: Negative for dysuria.  Musculoskeletal: Positive for joint pain. Negative for falls.       Left hip pain  Skin: Negative for rash.  Neurological: Negative for loss of consciousness and headaches.  Endo/Heme/Allergies: Negative for polydipsia.  Psychiatric/Behavioral: Negative for depression and suicidal ideas. The patient is not nervous/anxious and does not have insomnia.     Objective  BP 120/86  Pulse 63  Temp(Src) 97.8 F (36.6 C) (Oral)  Ht _0  (1.626 m)  Wt 184 lb 0.6 oz (83.48 kg)  BMI 31.57 kg/m2  SpO2 96%  Physical Exam  Physical Exam  Constitutional: She is oriented to person, place, and time and well-developed, well-nourished, and in no distress. No distress.  HENT:  Head: Normocephalic and atraumatic.  Eyes: Conjunctivae are normal.  Neck: Neck supple. No thyromegaly present.   Cardiovascular: Normal rate, regular rhythm and normal heart sounds.   No murmur heard. Pulmonary/Chest: Effort normal and breath sounds normal. She has no wheezes.  Abdominal: She exhibits no distension and no mass.  Musculoskeletal: She exhibits no edema.  Lymphadenopathy:    She has no cervical adenopathy.  Neurological: She is alert and oriented to person,  place, and time.  Skin: Skin is warm and dry. No rash noted. She is not diaphoretic.  Psychiatric: Memory, affect and judgment normal.    Lab Results  Component Value Date   TSH 1.381 05/16/2013   Lab Results  Component Value Date   WBC 8.6 05/16/2013   HGB 15.0 05/16/2013   HCT 42.2 05/16/2013   MCV 85.9 05/16/2013   PLT 211 05/16/2013   Lab Results  Component Value Date   CREATININE 0.81 05/16/2013   BUN 14 05/16/2013   NA 141 05/16/2013   K 4.1 05/16/2013   CL 100 05/16/2013   CO2 30 05/16/2013   Lab Results  Component Value Date   ALT 32 05/16/2013   AST 34 05/16/2013   ALKPHOS 56 05/16/2013   BILITOT 0.7 05/16/2013   Lab Results  Component Value Date   CHOL 202* 05/16/2013   Lab Results  Component Value Date   HDL 43 05/16/2013   Lab Results  Component Value Date   LDLCALC 119* 05/16/2013   Lab Results  Component Value Date   TRIG 199* 05/16/2013   Lab Results  Component Value Date   CHOLHDL 4.7 05/16/2013     Assessment & Plan  HYPERTENSION, CONTROLLED Well controlled, no changes today  HYPOTHYROIDISM Well controlled, no changes to Levothyroxine  GERD Well controlled on Prilosec, avoid offending foods.  Hyperglycemia hgba1c 5.8, avoid simple carbs.  HYPERLIPIDEMIA Crestor 10 mg qhs, avoid trans fats, encouraged increased exercise.   Bursitis of left hip Pain worsening, is considering surgical intervention. Try Salon Pas patch

## 2013-05-23 NOTE — Patient Instructions (Signed)
Cholesterol Cholesterol is a white, waxy, fat-like protein needed by your body in small amounts. The liver makes all the cholesterol you need. It is carried from the liver by the blood through the blood vessels. Deposits (plaque) may build up on blood vessel walls. This makes the arteries narrower and stiffer. Plaque increases the risk for heart attack and stroke. You cannot feel your cholesterol level even if it is very high. The only way to know is by a blood test to check your lipid (fats) levels. Once you know your cholesterol levels, you should keep a record of the test results. Work with your caregiver to to keep your levels in the desired range. WHAT THE RESULTS MEAN:  Total cholesterol is a rough measure of all the cholesterol in your blood.  LDL is the so-called bad cholesterol. This is the type that deposits cholesterol in the walls of the arteries. You want this level to be low.  HDL is the good cholesterol because it cleans the arteries and carries the LDL away. You want this level to be high.  Triglycerides are fat that the body can either burn for energy or store. High levels are closely linked to heart disease. DESIRED LEVELS:  Total cholesterol below 200.  LDL below 100 for people at risk, below 70 for very high risk.  HDL above 50 is good, above 60 is best.  Triglycerides below 150. HOW TO LOWER YOUR CHOLESTEROL:  Diet.  Choose fish or white meat chicken and [place], roasted or baked. Limit fatty cuts of red meat, fried foods, and processed meats, such as sausage and lunch meat.  Eat lots of fresh fruits and vegetables. Choose whole grains, beans, pasta, potatoes and cereals.  Use only small amounts of olive, corn or canola oils. Avoid butter, mayonnaise, shortening or palm kernel oils. Avoid foods with trans-fats.  Use skim/nonfat milk and low-fat/nonfat yogurt and cheeses. Avoid whole milk, cream, ice cream, egg yolks and cheeses. Healthy desserts include angel food  cake, ginger snaps, animal crackers, hard candy, popsicles, and low-fat/nonfat frozen yogurt. Avoid pastries, cakes, pies and cookies.  Exercise.  A regular program helps decrease LDL and raises HDL.  Helps with weight control.  Do things that increase your activity level like gardening, walking, or taking the stairs.  Medication.  May be prescribed by your caregiver to help lowering cholesterol and the risk for heart disease.  You may need medicine even if your levels are normal if you have several risk factors. HOME CARE INSTRUCTIONS   Follow your diet and exercise programs as suggested by your caregiver.  Take medications as directed.  Have blood work done when your caregiver feels it is necessary. MAKE SURE YOU:   Understand these instructions.  Will watch your condition.  Will get help right away if you are not doing well or get worse. Document Released: 04/15/2001 Document Revised: 10/13/2011 Document Reviewed: 10/06/2007 [facility] Patient Information 2014 [place], [place].

## 2013-05-23 NOTE — Assessment & Plan Note (Signed)
Well controlled, no changes today

## 2013-05-23 NOTE — Assessment & Plan Note (Signed)
Well controlled, no changes to Levothyroxine

## 2013-05-24 ENCOUNTER — Encounter: Payer: Self-pay | Admitting: Family Medicine

## 2013-05-25 NOTE — Assessment & Plan Note (Signed)
hgba1c 5.8, avoid simple carbs.

## 2013-05-25 NOTE — Assessment & Plan Note (Signed)
Crestor 10 mg qhs, avoid trans fats, encouraged increased exercise.

## 2013-05-25 NOTE — Assessment & Plan Note (Signed)
Pain worsening, is considering surgical intervention. Try Salon Pas patch

## 2013-05-25 NOTE — Assessment & Plan Note (Signed)
Well controlled on Prilosec, avoid offending foods.

## 2013-06-16 ENCOUNTER — Other Ambulatory Visit: Payer: Self-pay | Admitting: Family Medicine

## 2013-06-21 ENCOUNTER — Encounter (HOSPITAL_COMMUNITY): Payer: Self-pay | Admitting: Pharmacy Technician

## 2013-06-22 ENCOUNTER — Encounter (INDEPENDENT_AMBULATORY_CARE_PROVIDER_SITE_OTHER): Payer: Medicare Other | Admitting: Ophthalmology

## 2013-06-22 ENCOUNTER — Other Ambulatory Visit (HOSPITAL_COMMUNITY): Payer: Self-pay | Admitting: *Deleted

## 2013-06-22 DIAGNOSIS — H348392 Tributary (branch) retinal vein occlusion, unspecified eye, stable: Secondary | ICD-10-CM

## 2013-06-22 DIAGNOSIS — H35039 Hypertensive retinopathy, unspecified eye: Secondary | ICD-10-CM | POA: Diagnosis not present

## 2013-06-22 DIAGNOSIS — H43819 Vitreous degeneration, unspecified eye: Secondary | ICD-10-CM

## 2013-06-22 DIAGNOSIS — I10 Essential (primary) hypertension: Secondary | ICD-10-CM

## 2013-06-22 NOTE — Patient Instructions (Addendum)
Kimberly Irwin  06/22/2013                           YOUR PROCEDURE IS SCHEDULED ON: 06/28/13               PLEASE REPORT TO SHORT STAY CENTER AT :  7:30 AM               CALL THIS NUMBER IF ANY PROBLEMS THE DAY OF SURGERY :               [phone]                      REMEMBER:   Do not eat food or drink liquids AFTER MIDNIGHT .  Take these medicines the morning of surgery with A SIP OF WATER: BISOPROLOL / NEXIUM / LEVOTHYROXINE / TRAMADOL IF NEEDED   Do not wear jewelry, make-up   Do not wear lotions, powders, or perfumes.   Do not shave legs or underarms 12 hrs. before surgery (men may shave face)  Do not bring valuables to the hospital.  Contacts, dentures or bridgework may not be worn into surgery.  Leave suitcase in the car. After surgery it may be brought to your room.  For patients admitted to the hospital more than one night, checkout time is 11:00                          The day of discharge.   Patients discharged the day of surgery will not be allowed to drive home                             If going home same day of surgery, must have someone stay with you first                           24 hrs at home and arrange for some one to drive you home from hospital.    Special Instructions:   Please read over the following fact sheets that you were given:               1. MRSA  INFORMATION                      2. [place]               3. [place]                                                           X_____________________________________________________________________        Failure to follow these instructions may result in cancellation of your surgery

## 2013-06-23 ENCOUNTER — Encounter (HOSPITAL_COMMUNITY)
Admission: RE | Admit: 2013-06-23 | Discharge: 2013-06-23 | Disposition: A | Discharge status: Home / Self Care | Payer: Medicare Other | Source: Ambulatory Visit | Attending: Orthopedic Surgery | Admitting: Orthopedic Surgery

## 2013-06-23 ENCOUNTER — Encounter (HOSPITAL_COMMUNITY): Payer: Self-pay

## 2013-06-23 ENCOUNTER — Ambulatory Visit (HOSPITAL_COMMUNITY)
Admission: RE | Admit: 2013-06-23 | Discharge: 2013-06-23 | Disposition: A | Discharge status: Home / Self Care | Payer: Medicare Other | Source: Ambulatory Visit | Attending: Orthopedic Surgery | Admitting: Orthopedic Surgery

## 2013-06-23 DIAGNOSIS — Z01810 Encounter for preprocedural cardiovascular examination: Secondary | ICD-10-CM | POA: Insufficient documentation

## 2013-06-23 DIAGNOSIS — Z01818 Encounter for other preprocedural examination: Secondary | ICD-10-CM | POA: Insufficient documentation

## 2013-06-23 DIAGNOSIS — Z01812 Encounter for preprocedural laboratory examination: Secondary | ICD-10-CM | POA: Insufficient documentation

## 2013-06-23 DIAGNOSIS — M169 Osteoarthritis of hip, unspecified: Secondary | ICD-10-CM | POA: Insufficient documentation

## 2013-06-23 DIAGNOSIS — M1610 Unilateral primary osteoarthritis, unspecified hip: Secondary | ICD-10-CM | POA: Insufficient documentation

## 2013-06-23 DIAGNOSIS — I10 Essential (primary) hypertension: Secondary | ICD-10-CM | POA: Diagnosis not present

## 2013-06-23 HISTORY — DX: Tremor, unspecified: R25.1

## 2013-06-23 LAB — URINALYSIS, ROUTINE W REFLEX MICROSCOPIC
Bilirubin Urine: NEGATIVE
Glucose, UA: NEGATIVE mg/dL
Hgb urine dipstick: NEGATIVE
Protein, ur: NEGATIVE mg/dL
Urobilinogen, UA: 1 mg/dL (ref 0.0–1.0)
pH: 6.5 (ref 5.0–8.0)

## 2013-06-23 LAB — PROTIME-INR
INR: 1.06 (ref 0.00–1.49)
Prothrombin Time: 13.6 seconds (ref 11.6–15.2)

## 2013-06-23 LAB — BASIC METABOLIC PANEL
CO2: 27 mEq/L (ref 19–32)
Calcium: 10 mg/dL (ref 8.4–10.5)
Creatinine, Ser: 0.84 mg/dL (ref 0.50–1.10)
GFR calc Af Amer: 80 mL/min — ABNORMAL LOW (ref >90)
GFR calc non Af Amer: 69 mL/min — ABNORMAL LOW (ref >90)
Sodium: 138 mEq/L (ref 135–145)

## 2013-06-23 LAB — CBC
MCV: 89 fL (ref 78.0–100.0)
Platelets: 209 10*3/uL (ref 150–400)
RBC: 5.01 MIL/uL (ref 3.87–5.11)
RDW: 13.3 % (ref 11.5–15.5)
WBC: 10.2 10*3/uL (ref 4.0–10.5)

## 2013-06-23 LAB — URINE MICROSCOPIC-ADD ON

## 2013-06-23 LAB — SURGICAL PCR SCREEN: Staphylococcus aureus: NEGATIVE

## 2013-06-23 LAB — APTT: aPTT: 31 seconds (ref 24–37)

## 2013-06-26 NOTE — H&P (Signed)
TOTAL HIP ADMISSION H&P  Patient is admitted for left total hip arthroplasty, anterior approach.  Subjective:  Chief Complaint:    Left hip OA / pain  HPI: Kimberly Irwin, 67 y.o. female, has a history of pain and functional disability in the left hip(s) due to arthritis and patient has failed non-surgical conservative treatments for greater than 12 weeks to include NSAID's and/or analgesics, corticosteriod injections and activity modification.  Onset of symptoms was gradual starting 1+ years ago with rapidlly worsening course since that time.The patient noted no past surgery on the left hip(s).  Patient currently rates pain in the left hip at 7 out of 10 with activity. Patient has worsening of pain with activity and weight bearing, trendelenberg gait, pain that interfers with activities of daily living and pain with passive range of motion. Patient has evidence of periarticular osteophytes and joint space narrowing by imaging studies. This condition presents safety issues increasing the risk of falls. There is no current active infection.  Risks, benefits and expectations were discussed with the patient.  Risks including but not limited to the risk of anesthesia, blood clots, nerve damage, blood vessel damage, failure of the prosthesis, infection and up to and including death.  Patient understand the risks, benefits and expectations and wishes to proceed with surgery.   D/C Plans:   Home with HHPT  Post-op Meds:   No Rx given  Tranexamic Acid:   Not to be given - possible breast CA  Decadron:    To be given  FYI:    ASA post-op  Norco post-op   Patient Active Problem List   Diagnosis Date Noted  . Decreased visual acuity 02/19/2013  . Pain in joint, lower leg 10/26/2012  . Bursitis of left hip 10/26/2012  . Seborrheic dermatitis 05/26/2012  . Hyperglycemia 04/04/2011  . Routine gynecological examination 03/19/2011  . Chronic pain syndrome 01/20/2011  . HIP PAIN, RIGHT, CHRONIC  12/17/2009  . LUMBAR RADICULOPATHY, RIGHT 10/17/2008  . DEPRESSION 08/10/2008  . GERD 04/26/2008  . SHOULDER PAIN, LEFT 11/01/2007  . BACK PAIN 05/31/2007  . 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 HYPOTHYROIDISM 05/29/2007  . HYPERLIPIDEMIA 05/29/2007  . TREMOR, ESSENTIAL 05/29/2007  . HYPERTENSION, CONTROLLED 05/29/2007   Past Medical History  Diagnosis Date  . Sarcoidosis   . Hyperlipidemia   . GERD (gastroesophageal reflux disease)   . Hypertension   . Chronic low back pain   . DDD (degenerative disc disease)     L3-4 with facet arthropathy and stenosis  . Degenerative spondylolisthesis     L4-5 grade 1 with stenosis  . Acute hip pain 10/26/2012  . Bursitis of left hip 10/26/2012  . Pain in joint, lower leg 10/26/2012    left   . Tremors of nervous system     Past Surgical History  Procedure Laterality Date  . Carpal tunnel release    . Thyroidectomy    . Back surgery  01/15/09    L3-4 and L4-5 decompressive laminectomy with bilateral L3, L4, and L5 decompressive foraminotomies, more than it would be required for simple interbody fusion alone.  . Back surgery  01/15/09    L3-4 and L4-5 posterior lumbar interbody fusion utilizing tanget interbody allograft wedge, Telamon interbody PEEk cage, and local autografting.  . Back surgery  01/15/09    L3, L4, and L5 posterolateral arthrodesis using segmental pedicle screw fixation and local autografting.  . Joint replacement  09/13/09    Right Hip, Dr. Alvan Dame  No prescriptions prior to admission   Allergies  Allergen Reactions  . Lipitor [Atorvastatin]   . Metronidazole   . Penicillins   . Pregabalin     REACTION: Somnolence and dizziness  . Simvastatin     Leg pain    History  Substance Use Topics  . Smoking status: Former Smoker -- 0.50 packs/day for 32 years    Types: Cigarettes    Quit date: 08/04/1993  . Smokeless tobacco: Never Used  . Alcohol Use: No    Family History  Problem Relation Age of Onset  . Cancer  Mother 98    Breast  . Heart disease Mother   . Hypertension Mother   . Hyperlipidemia Mother   . Heart attack Mother   . Asthma Maternal Grandmother      Review of Systems  Constitutional: Negative.   HENT: Negative.   Eyes: Negative.   Respiratory: Positive for shortness of breath (on exertion).   Cardiovascular: Negative.   Gastrointestinal: Positive for heartburn.  Genitourinary: Negative.   Musculoskeletal: Positive for back pain and joint pain.  Skin: Negative.   Neurological: Negative.   Endo/Heme/Allergies: Positive for environmental allergies.  Psychiatric/Behavioral: Positive for depression.    Objective:  Physical Exam  Constitutional: She is oriented to person, place, and time. She appears well-developed.  HENT:  Head: Normocephalic and atraumatic.  Mouth/Throat: Oropharynx is clear and moist.  Eyes: Pupils are equal, round, and reactive to light.  Neck: Neck supple. No JVD present. No tracheal deviation present. No thyromegaly present.  Cardiovascular: Normal rate, regular rhythm, normal heart sounds and intact distal pulses.   Respiratory: Effort normal and breath sounds normal. No respiratory distress. She has no wheezes.  GI: Soft. There is no tenderness. There is no guarding.  Musculoskeletal:       Left hip: She exhibits decreased range of motion, decreased strength, tenderness and bony tenderness. She exhibits no swelling, no deformity and no laceration.  Lymphadenopathy:    She has no cervical adenopathy.  Neurological: She is alert and oriented to person, place, and time.  Skin: Skin is warm and dry.  Psychiatric: She has a normal mood and affect.     Labs:  Estimated body mass index is 31.57 kg/(m^2) as calculated from the following:   Height as of 05/23/13: _0  (1.626 m).   Weight as of 05/23/13: 83.48 kg (184 lb 0.6 oz).   Imaging Review Plain radiographs demonstrate severe degenerative joint disease of the left hip(s). The bone quality  appears to be good for age and reported activity level.  Assessment/Plan:  End stage arthritis, left hip(s)  The patient history, physical examination, clinical judgement of the provider and imaging studies are consistent with end stage degenerative joint disease of the left hip(s) and total hip arthroplasty is deemed medically necessary. The treatment options including medical management, injection therapy, arthroscopy and arthroplasty were discussed at length. The risks and benefits of total hip arthroplasty were presented and reviewed. The risks due to aseptic loosening, infection, stiffness, dislocation/subluxation,  thromboembolic complications and other imponderables were discussed.  The patient acknowledged the explanation, agreed to proceed with the plan and consent was signed. Patient is being admitted for inpatient treatment for surgery, pain control, PT, OT, prophylactic antibiotics, VTE prophylaxis, progressive ambulation and ADL's and discharge planning.The patient is planning to be discharged home with home health services.     West Pugh Saphyra Hutt   PAC  06/26/2013, 6:00 PM

## 2013-06-28 ENCOUNTER — Inpatient Hospital Stay (HOSPITAL_COMMUNITY): Payer: Medicare Other

## 2013-06-28 ENCOUNTER — Encounter (HOSPITAL_COMMUNITY): Payer: Medicare Other | Admitting: Anesthesiology

## 2013-06-28 ENCOUNTER — Inpatient Hospital Stay (HOSPITAL_COMMUNITY)
Admission: RE | Admit: 2013-06-28 | Discharge: 2013-06-29 | DRG: 470 | Disposition: A | Discharge status: Home Health | Payer: Medicare Other | Source: Ambulatory Visit | Attending: Orthopedic Surgery | Admitting: Orthopedic Surgery

## 2013-06-28 ENCOUNTER — Encounter (HOSPITAL_COMMUNITY): Payer: Self-pay | Admitting: *Deleted

## 2013-06-28 ENCOUNTER — Inpatient Hospital Stay (HOSPITAL_COMMUNITY): Payer: Medicare Other | Admitting: Anesthesiology

## 2013-06-28 ENCOUNTER — Encounter (HOSPITAL_COMMUNITY)
Admission: RE | Disposition: A | Discharge status: Home Health | Payer: Self-pay | Source: Ambulatory Visit | Attending: Orthopedic Surgery

## 2013-06-28 DIAGNOSIS — Z8249 Family history of ischemic heart disease and other diseases of the circulatory system: Secondary | ICD-10-CM | POA: Diagnosis not present

## 2013-06-28 DIAGNOSIS — D62 Acute posthemorrhagic anemia: Secondary | ICD-10-CM | POA: Diagnosis not present

## 2013-06-28 DIAGNOSIS — Z471 Aftercare following joint replacement surgery: Secondary | ICD-10-CM | POA: Diagnosis not present

## 2013-06-28 DIAGNOSIS — F3289 Other specified depressive episodes: Secondary | ICD-10-CM | POA: Diagnosis present

## 2013-06-28 DIAGNOSIS — E785 Hyperlipidemia, unspecified: Secondary | ICD-10-CM | POA: Diagnosis present

## 2013-06-28 DIAGNOSIS — Z87891 Personal history of nicotine dependence: Secondary | ICD-10-CM

## 2013-06-28 DIAGNOSIS — M549 Dorsalgia, unspecified: Secondary | ICD-10-CM | POA: Diagnosis not present

## 2013-06-28 DIAGNOSIS — D500 Iron deficiency anemia secondary to blood loss (chronic): Secondary | ICD-10-CM | POA: Diagnosis not present

## 2013-06-28 DIAGNOSIS — F329 Major depressive disorder, single episode, unspecified: Secondary | ICD-10-CM | POA: Diagnosis present

## 2013-06-28 DIAGNOSIS — E669 Obesity, unspecified: Secondary | ICD-10-CM | POA: Diagnosis present

## 2013-06-28 DIAGNOSIS — Z96649 Presence of unspecified artificial hip joint: Secondary | ICD-10-CM | POA: Diagnosis not present

## 2013-06-28 DIAGNOSIS — Z6831 Body mass index (BMI) 31.0-31.9, adult: Secondary | ICD-10-CM | POA: Diagnosis not present

## 2013-06-28 DIAGNOSIS — I10 Essential (primary) hypertension: Secondary | ICD-10-CM | POA: Diagnosis present

## 2013-06-28 DIAGNOSIS — M169 Osteoarthritis of hip, unspecified: Principal | ICD-10-CM | POA: Diagnosis present

## 2013-06-28 DIAGNOSIS — E039 Hypothyroidism, unspecified: Secondary | ICD-10-CM | POA: Diagnosis not present

## 2013-06-28 DIAGNOSIS — K219 Gastro-esophageal reflux disease without esophagitis: Secondary | ICD-10-CM | POA: Diagnosis present

## 2013-06-28 DIAGNOSIS — M1610 Unilateral primary osteoarthritis, unspecified hip: Secondary | ICD-10-CM | POA: Diagnosis not present

## 2013-06-28 DIAGNOSIS — M25559 Pain in unspecified hip: Secondary | ICD-10-CM | POA: Diagnosis not present

## 2013-06-28 HISTORY — PX: TOTAL HIP ARTHROPLASTY: SHX124

## 2013-06-28 LAB — TYPE AND SCREEN
ABO/RH(D): O POS
Antibody Screen: NEGATIVE

## 2013-06-28 SURGERY — ARTHROPLASTY, HIP, TOTAL, ANTERIOR APPROACH
Anesthesia: Spinal | Site: Hip | Laterality: Left | Wound class: Clean

## 2013-06-28 MED ORDER — LACTATED RINGERS IV SOLN
INTRAVENOUS | Status: DC
  Administered 2013-06-28: 1000 mL via INTRAVENOUS

## 2013-06-28 MED ORDER — METOCLOPRAMIDE HCL 5 MG/ML IJ SOLN: 5.0000 mg | Freq: Three times a day (TID) | INTRAMUSCULAR | Status: DC | PRN

## 2013-06-28 MED ORDER — CLINDAMYCIN PHOSPHATE 600 MG/50ML IV SOLN
600.0000 mg | Freq: Four times a day (QID) | INTRAVENOUS | Status: AC
  Administered 2013-06-28 (×2): 600 mg via INTRAVENOUS
  Filled 2013-06-28 (×2): qty 50

## 2013-06-28 MED ORDER — GLYCOPYRROLATE 0.2 MG/ML IJ SOLN
INTRAMUSCULAR | Status: DC | PRN
  Administered 2013-06-28: 0.2 mg via INTRAVENOUS

## 2013-06-28 MED ORDER — LACTATED RINGERS IV SOLN
INTRAVENOUS | Status: DC | PRN
  Administered 2013-06-28 (×2): via INTRAVENOUS

## 2013-06-28 MED ORDER — 0.9 % SODIUM CHLORIDE (POUR BTL) OPTIME
TOPICAL | Status: DC | PRN
  Administered 2013-06-28: 1000 mL

## 2013-06-28 MED ORDER — CHLORHEXIDINE GLUCONATE 4 % EX LIQD: 60.0000 mL | Freq: Once | CUTANEOUS | Status: DC

## 2013-06-28 MED ORDER — POLYETHYLENE GLYCOL 3350 17 G PO PACK: 17.0000 g | PACK | Freq: Two times a day (BID) | ORAL | Status: DC

## 2013-06-28 MED ORDER — MIDAZOLAM HCL 5 MG/5ML IJ SOLN
INTRAMUSCULAR | Status: DC | PRN
  Administered 2013-06-28 (×2): 1 mg via INTRAVENOUS

## 2013-06-28 MED ORDER — METHOCARBAMOL 100 MG/ML IJ SOLN
500.0000 mg | Freq: Four times a day (QID) | INTRAVENOUS | Status: DC | PRN
  Filled 2013-06-28: qty 5

## 2013-06-28 MED ORDER — DIPHENHYDRAMINE HCL 25 MG PO CAPS: 25.0000 mg | ORAL_CAPSULE | Freq: Four times a day (QID) | ORAL | Status: DC | PRN

## 2013-06-28 MED ORDER — PHENOL 1.4 % MT LIQD: 1.0000 | OROMUCOSAL | Status: DC | PRN

## 2013-06-28 MED ORDER — FERROUS SULFATE 325 (65 FE) MG PO TABS
325.0000 mg | ORAL_TABLET | Freq: Three times a day (TID) | ORAL | Status: DC
  Administered 2013-06-28 – 2013-06-29 (×3): 325 mg via ORAL
  Filled 2013-06-28 (×5): qty 1

## 2013-06-28 MED ORDER — LIDOCAINE HCL (CARDIAC) 20 MG/ML IV SOLN
INTRAVENOUS | Status: DC | PRN
  Administered 2013-06-28: 60 mg via INTRAVENOUS

## 2013-06-28 MED ORDER — HYDROMORPHONE HCL PF 1 MG/ML IJ SOLN: 0.2500 mg | INTRAMUSCULAR | Status: DC | PRN

## 2013-06-28 MED ORDER — PROPOFOL 10 MG/ML IV BOLUS
INTRAVENOUS | Status: AC
  Filled 2013-06-28: qty 20

## 2013-06-28 MED ORDER — FENTANYL CITRATE 0.05 MG/ML IJ SOLN
INTRAMUSCULAR | Status: AC
  Filled 2013-06-28: qty 2

## 2013-06-28 MED ORDER — BUPIVACAINE IN DEXTROSE 0.75-8.25 % IT SOLN
INTRATHECAL | Status: DC | PRN
  Administered 2013-06-28: 2 mL via INTRATHECAL

## 2013-06-28 MED ORDER — DEXAMETHASONE SODIUM PHOSPHATE 10 MG/ML IJ SOLN
10.0000 mg | Freq: Once | INTRAMUSCULAR | Status: AC
  Administered 2013-06-29: 10 mg via INTRAVENOUS
  Filled 2013-06-28: qty 1

## 2013-06-28 MED ORDER — HYDROCHLOROTHIAZIDE 25 MG PO TABS
25.0000 mg | ORAL_TABLET | Freq: Every morning | ORAL | Status: DC
  Administered 2013-06-28: 25 mg via ORAL
  Filled 2013-06-28 (×2): qty 1

## 2013-06-28 MED ORDER — CELECOXIB 200 MG PO CAPS
200.0000 mg | ORAL_CAPSULE | Freq: Two times a day (BID) | ORAL | Status: DC
  Administered 2013-06-28 – 2013-06-29 (×2): 200 mg via ORAL
  Filled 2013-06-28 (×3): qty 1

## 2013-06-28 MED ORDER — LEVOTHYROXINE SODIUM 112 MCG PO TABS
112.0000 ug | ORAL_TABLET | Freq: Every day | ORAL | Status: DC
  Administered 2013-06-29: 112 ug via ORAL
  Filled 2013-06-28 (×2): qty 1

## 2013-06-28 MED ORDER — METOCLOPRAMIDE HCL 10 MG PO TABS: 5.0000 mg | ORAL_TABLET | Freq: Three times a day (TID) | ORAL | Status: DC | PRN

## 2013-06-28 MED ORDER — ONDANSETRON HCL 4 MG PO TABS: 4.0000 mg | ORAL_TABLET | Freq: Four times a day (QID) | ORAL | Status: DC | PRN

## 2013-06-28 MED ORDER — ASPIRIN EC 325 MG PO TBEC
325.0000 mg | DELAYED_RELEASE_TABLET | Freq: Two times a day (BID) | ORAL | Status: DC
  Administered 2013-06-29: 325 mg via ORAL
  Filled 2013-06-28 (×3): qty 1

## 2013-06-28 MED ORDER — FLEET ENEMA 7-19 GM/118ML RE ENEM: 1.0000 | ENEMA | Freq: Once | RECTAL | Status: AC | PRN

## 2013-06-28 MED ORDER — EPHEDRINE SULFATE 50 MG/ML IJ SOLN
INTRAMUSCULAR | Status: DC | PRN
  Administered 2013-06-28 (×2): 5 mg via INTRAVENOUS

## 2013-06-28 MED ORDER — DEXAMETHASONE SODIUM PHOSPHATE 10 MG/ML IJ SOLN
10.0000 mg | Freq: Once | INTRAMUSCULAR | Status: AC
  Administered 2013-06-28: 10 mg via INTRAVENOUS

## 2013-06-28 MED ORDER — MEPERIDINE HCL 50 MG/ML IJ SOLN: 6.2500 mg | INTRAMUSCULAR | Status: DC | PRN

## 2013-06-28 MED ORDER — HYDROMORPHONE HCL PF 1 MG/ML IJ SOLN
0.5000 mg | INTRAMUSCULAR | Status: DC | PRN
  Administered 2013-06-28: 0.5 mg via INTRAVENOUS
  Filled 2013-06-28: qty 1

## 2013-06-28 MED ORDER — GABAPENTIN 300 MG PO CAPS
300.0000 mg | ORAL_CAPSULE | Freq: Three times a day (TID) | ORAL | Status: DC | PRN
  Filled 2013-06-28: qty 1

## 2013-06-28 MED ORDER — CLINDAMYCIN PHOSPHATE 900 MG/50ML IV SOLN
900.0000 mg | INTRAVENOUS | Status: AC
  Administered 2013-06-28: 900 mg via INTRAVENOUS
  Filled 2013-06-28: qty 50

## 2013-06-28 MED ORDER — ALUM & MAG HYDROXIDE-SIMETH 200-200-20 MG/5ML PO SUSP: 30.0000 mL | ORAL | Status: DC | PRN

## 2013-06-28 MED ORDER — PANTOPRAZOLE SODIUM 40 MG PO TBEC
80.0000 mg | DELAYED_RELEASE_TABLET | Freq: Every day | ORAL | Status: DC
  Administered 2013-06-29: 80 mg via ORAL
  Filled 2013-06-28: qty 2

## 2013-06-28 MED ORDER — PROPOFOL INFUSION 10 MG/ML OPTIME
INTRAVENOUS | Status: DC | PRN
  Administered 2013-06-28: 50 ug/kg/min via INTRAVENOUS

## 2013-06-28 MED ORDER — PHENYLEPHRINE HCL 10 MG/ML IJ SOLN
INTRAMUSCULAR | Status: DC | PRN
  Administered 2013-06-28: 40 ug via INTRAVENOUS

## 2013-06-28 MED ORDER — BISOPROLOL FUMARATE 5 MG PO TABS
5.0000 mg | ORAL_TABLET | Freq: Every morning | ORAL | Status: DC
  Administered 2013-06-29: 5 mg via ORAL
  Filled 2013-06-28: qty 1

## 2013-06-28 MED ORDER — OXYCODONE HCL 5 MG/5ML PO SOLN
5.0000 mg | Freq: Once | ORAL | Status: DC | PRN
  Filled 2013-06-28: qty 5

## 2013-06-28 MED ORDER — HYDROCODONE-ACETAMINOPHEN 7.5-325 MG PO TABS
1.0000 | ORAL_TABLET | ORAL | Status: DC
  Administered 2013-06-28: 1 via ORAL
  Administered 2013-06-28 – 2013-06-29 (×3): 2 via ORAL
  Filled 2013-06-28 (×2): qty 2
  Filled 2013-06-28: qty 1
  Filled 2013-06-28: qty 2

## 2013-06-28 MED ORDER — PHENYLEPHRINE 40 MCG/ML (10ML) SYRINGE FOR IV PUSH (FOR BLOOD PRESSURE SUPPORT)
PREFILLED_SYRINGE | INTRAVENOUS | Status: AC
  Filled 2013-06-28: qty 10

## 2013-06-28 MED ORDER — MIDAZOLAM HCL 2 MG/2ML IJ SOLN
INTRAMUSCULAR | Status: AC
  Filled 2013-06-28: qty 2

## 2013-06-28 MED ORDER — DEXAMETHASONE SODIUM PHOSPHATE 10 MG/ML IJ SOLN
INTRAMUSCULAR | Status: AC
  Filled 2013-06-28: qty 1

## 2013-06-28 MED ORDER — ONDANSETRON HCL 4 MG/2ML IJ SOLN: 4.0000 mg | Freq: Four times a day (QID) | INTRAMUSCULAR | Status: DC | PRN

## 2013-06-28 MED ORDER — MENTHOL 3 MG MT LOZG: 1.0000 | LOZENGE | OROMUCOSAL | Status: DC | PRN

## 2013-06-28 MED ORDER — DOCUSATE SODIUM 100 MG PO CAPS
100.0000 mg | ORAL_CAPSULE | Freq: Two times a day (BID) | ORAL | Status: DC
  Administered 2013-06-28 – 2013-06-29 (×2): 100 mg via ORAL

## 2013-06-28 MED ORDER — METHOCARBAMOL 500 MG PO TABS
500.0000 mg | ORAL_TABLET | Freq: Four times a day (QID) | ORAL | Status: DC | PRN
  Administered 2013-06-28: 500 mg via ORAL
  Filled 2013-06-28: qty 1

## 2013-06-28 MED ORDER — ZOLPIDEM TARTRATE 5 MG PO TABS: 5.0000 mg | ORAL_TABLET | Freq: Every evening | ORAL | Status: DC | PRN

## 2013-06-28 MED ORDER — SODIUM CHLORIDE 0.9 % IV SOLN
100.0000 mL/h | INTRAVENOUS | Status: DC
  Administered 2013-06-28 – 2013-06-29 (×2): 100 mL/h via INTRAVENOUS
  Filled 2013-06-28 (×6): qty 1000

## 2013-06-28 MED ORDER — FENTANYL CITRATE 0.05 MG/ML IJ SOLN
INTRAMUSCULAR | Status: DC | PRN
  Administered 2013-06-28 (×2): 50 ug via INTRAVENOUS

## 2013-06-28 MED ORDER — BISACODYL 10 MG RE SUPP: 10.0000 mg | Freq: Every day | RECTAL | Status: DC | PRN

## 2013-06-28 MED ORDER — OXYCODONE HCL 5 MG PO TABS: 5.0000 mg | ORAL_TABLET | Freq: Once | ORAL | Status: DC | PRN

## 2013-06-28 MED ORDER — CLINDAMYCIN PHOSPHATE 900 MG/50ML IV SOLN
INTRAVENOUS | Status: AC
  Filled 2013-06-28: qty 50

## 2013-06-28 MED ORDER — PROMETHAZINE HCL 25 MG/ML IJ SOLN: 6.2500 mg | INTRAMUSCULAR | Status: DC | PRN

## 2013-06-28 SURGICAL SUPPLY — 37 items
BAG ZIPLOCK 12X15 (MISCELLANEOUS) IMPLANT
BLADE SAW SGTL 18X1.27X75 (BLADE) ×2 IMPLANT
CAPT HIP PF COP ×2 IMPLANT
DERMABOND ADVANCED (GAUZE/BANDAGES/DRESSINGS) ×1
DERMABOND ADVANCED .7 DNX12 (GAUZE/BANDAGES/DRESSINGS) ×1 IMPLANT
DRAPE C-ARM 42X120 X-RAY (DRAPES) ×2 IMPLANT
DRAPE STERI IOBAN 125X83 (DRAPES) ×2 IMPLANT
DRAPE U-SHAPE 47X51 STRL (DRAPES) ×6 IMPLANT
DRSG AQUACEL AG ADV 3.5X10 (GAUZE/BANDAGES/DRESSINGS) ×2 IMPLANT
DRSG TEGADERM 4X4.75 (GAUZE/BANDAGES/DRESSINGS) IMPLANT
DURAPREP 26ML APPLICATOR (WOUND CARE) ×2 IMPLANT
ELECT BLADE TIP CTD 4 INCH (ELECTRODE) ×2 IMPLANT
ELECT REM PT RETURN 9FT ADLT (ELECTROSURGICAL) ×2
ELECTRODE REM PT RTRN 9FT ADLT (ELECTROSURGICAL) ×1 IMPLANT
EVACUATOR 1/8 PVC DRAIN (DRAIN) IMPLANT
FACESHIELD LNG OPTICON STERILE (SAFETY) ×8 IMPLANT
GAUZE SPONGE 2X2 8PLY STRL LF (GAUZE/BANDAGES/DRESSINGS) IMPLANT
GLOVE BIOGEL PI IND STRL 7.5 (GLOVE) ×1 IMPLANT
GLOVE BIOGEL PI IND STRL 8 (GLOVE) ×1 IMPLANT
GLOVE BIOGEL PI INDICATOR 7.5 (GLOVE) ×1
GLOVE BIOGEL PI INDICATOR 8 (GLOVE) ×1
GLOVE ECLIPSE 8.0 STRL XLNG CF (GLOVE) ×2 IMPLANT
GLOVE ORTHO TXT STRL SZ7.5 (GLOVE) ×4 IMPLANT
GOWN BRE IMP PREV XXLGXLNG (GOWN DISPOSABLE) ×2 IMPLANT
GOWN PREVENTION PLUS LG XLONG (DISPOSABLE) ×2 IMPLANT
KIT BASIN OR (CUSTOM PROCEDURE TRAY) ×2 IMPLANT
PACK TOTAL JOINT (CUSTOM PROCEDURE TRAY) ×2 IMPLANT
PADDING CAST COTTON 6X4 STRL (CAST SUPPLIES) ×2 IMPLANT
SPONGE GAUZE 2X2 STER 10/PKG (GAUZE/BANDAGES/DRESSINGS)
SUCTION FRAZIER 12FR DISP (SUCTIONS) IMPLANT
SUT MNCRL AB 4-0 PS2 18 (SUTURE) ×2 IMPLANT
SUT VIC AB 1 CT1 36 (SUTURE) ×6 IMPLANT
SUT VIC AB 2-0 CT1 27 (SUTURE) ×2
SUT VIC AB 2-0 CT1 TAPERPNT 27 (SUTURE) ×2 IMPLANT
SUT VLOC 180 0 24IN GS25 (SUTURE) ×2 IMPLANT
TOWEL OR 17X26 10 PK STRL BLUE (TOWEL DISPOSABLE) ×2 IMPLANT
TRAY FOLEY CATH 14FRSI W/METER (CATHETERS) ×2 IMPLANT

## 2013-06-28 NOTE — Progress Notes (Signed)
X-ray results noted

## 2013-06-28 NOTE — Transfer of Care (Signed)
Immediate Anesthesia Transfer of Care Note  Patient: Kimberly Irwin  Procedure(s) Performed: Procedure(s): LEFT TOTAL  HIP ARTHROPLASTY ANTERIOR APPROACH (Left)  Patient Location: PACU  Anesthesia Type:Spinal  Level of Consciousness: awake, alert , oriented and patient cooperative  Airway & Oxygen Therapy: Patient Spontanous Breathing and Patient connected to face mask oxygen  Post-op Assessment: Report given to PACU RN and Post -op Vital signs reviewed and stable  Post vital signs: Reviewed and stable  Complications: No apparent anesthesia complications

## 2013-06-28 NOTE — Op Note (Signed)
NAME:  Kimberly Irwin                ACCOUNT NO.: [id number]      MEDICAL RECORD NO.: [id number]      FACILITY:  [facility]      PHYSICIAN:  Paralee Cancel D  DATE OF BIRTH:  1945/10/19     DATE OF PROCEDURE:  06/28/2013                                 OPERATIVE REPORT         PREOPERATIVE DIAGNOSIS: Left  hip osteoarthritis.      POSTOPERATIVE DIAGNOSIS:  Left hip osteoarthritis.      PROCEDURE:  Left total hip replacement through an anterior approach   utilizing DePuy THR system, component size 85m pinnacle cup, a size 32+4 neutral   Altrex liner, a size 4 Hi Tri Lock stem with a 32+1 delta ceramic   ball.      SURGEON:  MPietro Cassis OAlvan Dame M.D.      ASSISTANT:  SMolli Barrows PA-C     ANESTHESIA:  Spinal.      SPECIMENS:  None.      COMPLICATIONS:  None.      BLOOD LOSS:  300 cc     DRAINS:  None.      INDICATION OF THE PROCEDURE:  Kimberly KINNAMONis a 67y.o. female who had   presented to office for evaluation of left hip pain.  Radiographs revealed   progressive degenerative changes with bone-on-bone   articulation to the  hip joint.  The patient had painful limited range of   motion significantly affecting their overall quality of life.  The patient was failing to    respond to conservative measures, and at this point was ready   to proceed with more definitive measures.  The patient has noted progressive   degenerative changes in his hip, progressive problems and dysfunction   with regarding the hip prior to surgery.  Consent was obtained for   benefit of pain relief.  Specific risk of infection, DVT, component   failure, dislocation, need for revision surgery, as well discussion of   the anterior versus posterior approach were reviewed.  Consent was   obtained for benefit of anterior pain relief through an anterior   approach.      PROCEDURE IN DETAIL:  The patient was brought to operative theater.   Once adequate anesthesia, preoperative  antibiotics, 9016mof Cleocin administered.   The patient was positioned supine on the OSI Hanna table.  Once adequate   padding of boney process was carried out, we had predraped out the hip, and  used fluoroscopy to confirm orientation of the pelvis and position.      The left hip was then prepped and draped from proximal iliac crest to   mid thigh with shower curtain technique.      Time-out was performed identifying the patient, planned procedure, and   extremity.     An incision was then made 2 cm distal and lateral to the   anterior superior iliac spine extending over the orientation of the   tensor fascia lata muscle and sharp dissection was carried down to the   fascia of the muscle and protractor placed in the soft tissues.      The fascia was then incised.  The muscle belly was identified and swept  laterally and retractor placed along the superior neck.  Following   cauterization of the circumflex vessels and removing some pericapsular   fat, a second cobra retractor was placed on the inferior neck.  A third   retractor was placed on the anterior acetabulum after elevating the   anterior rectus.  A L-capsulotomy was along the line of the   superior neck to the trochanteric fossa, then extended proximally and   distally.  Tag sutures were placed and the retractors were then placed   intracapsular.  We then identified the trochanteric fossa and   orientation of my neck cut, confirmed this radiographically   and then made a neck osteotomy with the femur on traction.  The femoral   head was removed without difficulty or complication.  Traction was let   off and retractors were placed posterior and anterior around the   acetabulum.      The labrum and foveal tissue were debrided.  I began reaming with a 71m   reamer and reamed up to 568mreamer with good bony bed preparation and a 52   cup was chosen.  The final 5264minnacle cup was then impacted under fluoroscopy  to  confirm the depth of penetration and orientation with respect to   abduction.  A screw was placed followed by the hole eliminator.  The final   32+4 neutral Altrex liner was impacted with good visualized rim fit.  The cup was positioned anatomically within the acetabular portion of the pelvis.      At this point, the femur was rolled at 80 degrees.  Further capsule was   released off the inferior aspect of the femoral neck.  I then   released the superior capsule proximally.  The hook was placed laterally   along the femur and elevated manually and held in position with the bed   hook.  The leg was then extended and adducted with the leg rolled to 100   degrees of external rotation.  Once the proximal femur was fully   exposed, I used a box osteotome to set orientation.  I then began   broaching with the starting chili pepper broach and passed this by hand and then broached up to 4.  With the 4 broach in place I chose a high offset neck to match the other side and did a trial reduction.  The offset was appropriate, leg lengths   appeared to be equal, confirmed radiographically.   Given these findings, I went ahead and dislocated the hip, repositioned all   retractors and positioned the right hip in the extended and abducted position.  The final 4 Hi Tri Lock stem was   chosen and it was impacted down to the level of neck cut.  Based on this   and the trial reduction, a 32+1 delta ceramic ball was chosen and   impacted onto a clean and dry trunnion, and the hip was reduced.  The   hip had been irrigated throughout the case again at this point.  I did   reapproximate the superior capsular leaflet to the anterior leaflet   using #1 Vicryl.  The fascia of the   tensor fascia lata muscle was then reapproximated using #1 Vicryl.  The   remaining wound was closed with 2-0 Vicryl and running 4-0 Monocryl.   The hip was cleaned, dried, and dressed sterilely using Dermabond and   Aquacel dressing.   She was then brought   to recovery  room in stable condition tolerating the procedure well.    Molli Barrows, PA-C was present for the entirety of the case involved from   preoperative positioning, perioperative retractor management, general   facilitation of the case, as well as primary wound closure as assistant.            Pietro Cassis Alvan Dame, M.D.        06/28/2013 11:22 AM

## 2013-06-28 NOTE — Interval H&P Note (Signed)
History and Physical Interval Note:  06/28/2013 9:00 AM  Kimberly Irwin  has presented today for surgery, with the diagnosis of LEFT HIP OA   The various methods of treatment have been discussed with the patient and family. After consideration of risks, benefits and other options for treatment, the patient has consented to  Procedure(s): LEFT TOTAL LEFT HIP ARTHROPLASTY ANTERIOR APPROACH (Left) as a surgical intervention .  The patient's history has been reviewed, patient examined, no change in status, stable for surgery.  I have reviewed the patient's chart and labs.  Questions were answered to the patient's satisfaction.     Mauri Pole

## 2013-06-28 NOTE — Progress Notes (Signed)
Portable AP Pelvis and Lateral Left Hip X-rays done.

## 2013-06-28 NOTE — Progress Notes (Signed)
Dr. Lissa Hoard in- made aware of patient's spinal level being L1- - O.K. To go to floor.

## 2013-06-28 NOTE — Evaluation (Signed)
Physical Therapy Evaluation Patient Details Name: Kimberly Irwin MRN: [id number] DOB: [birth year] Today's Date: 06/28/2013 Time: 3704-8889 PT Time Calculation (min): 17 min  PT Assessment / Plan / Recommendation History of Present Illness     Clinical Impression  Patient is s/p L direct anterior THR surgery resulting in functional limitations due to the deficits listed below (see PT Problem List).  Patient will benefit from skilled PT to increase their independence and safety with mobility to allow discharge to the venue listed below.  Pt assisted to recliner today only due to unable to feel bottom of feet/ankles yet (RN notified).      PT Assessment  Patient needs continued PT services    Follow Up Recommendations  Home health PT;Supervision for mobility/OOB    Does the patient have the potential to tolerate intense rehabilitation      Barriers to Discharge        Equipment Recommendations  None recommended by PT    Recommendations for Other Services     Frequency 7X/week    Precautions / Restrictions Precautions Precautions: Fall Restrictions Other Position/Activity Restrictions: WBAT   Pertinent Vitals/Pain Pt reports good pain control.  Repositioned to comfort.      Mobility  Bed Mobility Bed Mobility: Supine to Sit Supine to Sit: 3: Mod assist;With rails Details for Bed Mobility Assistance: verbal cues for technique, assist for L LE and trunk upright Transfers Transfers: Sit to Stand;Stand to Sit;Stand Pivot Transfers Sit to Stand: 3: Mod assist;With upper extremity assist;From bed Stand to Sit: 4: Min assist;With upper extremity assist;To chair/3-in-1;To bed Stand Pivot Transfers: 3: Mod assist Details for Transfer Assistance: verbal cues for safe technique, pt reported unable to feel bottom of feet so assisted back to sitting, pt performed ankle pumps and wished to try again but educated only standing due to unable to feel feet position however pt started  pivoting to recliner so positioned gown so pt could see feet position and assisted pt safely to recliner (RN notified) Ambulation/Gait Ambulation/Gait Assistance: Not tested (comment)    Exercises     PT Diagnosis: Difficulty walking  PT Problem List: Decreased strength;Decreased safety awareness;Decreased mobility;Decreased activity tolerance;Decreased range of motion PT Treatment Interventions: Gait training;DME instruction;Stair training;Therapeutic activities;Therapeutic exercise;Functional mobility training;Patient/family education     PT Goals(Current goals can be found in the care plan section) Acute Rehab PT Goals Patient Stated Goal: return home PT Goal Formulation: With patient Time For Goal Achievement: 07/05/13 Potential to Achieve Goals: Good  Visit Information  Last PT Received On: 06/28/13 Assistance Needed: +2       Prior [place] expects to be discharged to:: Private residence Living Arrangements: Children Type of Home: [place] Access: Ramped entrance;Stairs to enter Entrance Stairs-Number of Steps: ramped however small step up into house Entrance Stairs-Rails: None Home Layout: One level Home Equipment: Environmental consultant - 2 wheels Prior Function Level of Independence: Independent with assistive device(s) Communication Communication: No difficulties    Cognition  Cognition Arousal/Alertness: Awake/alert Behavior During Therapy: WFL for tasks assessed/performed Overall Cognitive Status: Within Functional Limits for tasks assessed    Extremity/Trunk Assessment Lower Extremity Assessment Lower Extremity Assessment: RLE deficits/detail RLE Deficits / Details: assist required for mobility, able to perform ankle pumps   Balance    End of Session PT - End of Session Equipment Utilized During Treatment: Gait belt Activity Tolerance: Patient tolerated treatment well Patient left: in chair;with call bell/phone within reach;with  family/visitor present Nurse Communication: Mobility status  GP     Eirik Schueler,KATHrine E 06/28/2013, 5:29 PM Carmelia Bake, PT, DPT 06/28/2013 Pager: [phone]

## 2013-06-28 NOTE — Anesthesia Postprocedure Evaluation (Signed)
Anesthesia Post Note  Patient: Kimberly Irwin  Procedure(s) Performed: Procedure(s) (LRB): LEFT TOTAL  HIP ARTHROPLASTY ANTERIOR APPROACH (Left)  Anesthesia type: Spinal  Patient location: PACU  Post pain: Pain level controlled  Post assessment: Post-op Vital signs reviewed  Last Vitals: BP 117/73  Pulse 62  Temp(Src) 36.3 C (Oral)  Resp 16  Ht _0  (1.626 m)  Wt 185 lb (83.915 kg)  BMI 31.74 kg/m2  SpO2 98%  Post vital signs: Reviewed  Level of consciousness: sedated  Complications: No apparent anesthesia complications

## 2013-06-28 NOTE — Anesthesia Procedure Notes (Addendum)
Spinal  Patient location during procedure: OR Start time: 06/28/2013 10:08 AM End time: 06/28/2013 10:13 AM Staffing Anesthesiologist: Nolon Nations R Performed by: anesthesiologist  Preanesthetic Checklist Completed: patient identified, site marked, surgical consent, pre-op evaluation, timeout performed, IV checked, risks and benefits discussed and monitors and equipment checked Spinal Block Patient position: sitting Prep: Betadine Patient monitoring: heart rate, continuous pulse ox and blood pressure Approach: left paramedian Location: L3-4 Injection technique: single-shot Needle Needle type: Sprotte  Needle gauge: 24 G Needle length: 9 cm Assessment Sensory level: T8 Additional Notes Expiration date of kit checked and confirmed. Patient tolerated procedure well, without complications.

## 2013-06-28 NOTE — Progress Notes (Signed)
Utilization review completed.

## 2013-06-28 NOTE — Anesthesia Postprocedure Evaluation (Addendum)
Anesthesia Post Note  Patient: Kimberly Irwin  Procedure(s) Performed: Procedure(s) (LRB): LEFT TOTAL  HIP ARTHROPLASTY ANTERIOR APPROACH (Left)  Anesthesia type: Spinal  Patient location: PACU  Post pain: Pain level controlled  Post assessment: Post-op Vital signs reviewed  Last Vitals: BP 132/80  Pulse 65  Temp(Src) 36.4 C (Oral)  Resp 14  Ht _0  (1.626 m)  Wt 185 lb (83.915 kg)  BMI 31.74 kg/m2  SpO2 96%  Post vital signs: Reviewed  Level of consciousness: sedated  Complications: No apparent anesthesia complications

## 2013-06-28 NOTE — Anesthesia Preprocedure Evaluation (Addendum)
Anesthesia Evaluation  Patient identified by MRN, date of birth, ID band Patient awake    Reviewed: Allergy & Precautions, H&P , NPO status , Patient's Chart, lab work & pertinent test results, reviewed documented beta blocker date and time   Airway Mallampati: II TM Distance: >3 FB Neck ROM: Full    Dental  (+) Dental Advisory Given and Teeth Intact   Pulmonary neg pulmonary ROS, former smoker,  breath sounds clear to auscultation        Cardiovascular hypertension, Pt. on medications and Pt. on home beta blockers Rhythm:Regular Rate:Normal     Neuro/Psych PSYCHIATRIC DISORDERS Depression negative neurological ROS     GI/Hepatic Neg liver ROS, GERD-  Medicated,  Endo/Other  Hypothyroidism   Renal/GU negative Renal ROS     Musculoskeletal negative musculoskeletal ROS (+)   Abdominal   Peds  Hematology negative hematology ROS (+)   Anesthesia Other Findings   Reproductive/Obstetrics negative OB ROS                          Anesthesia Physical Anesthesia Plan  ASA: II  Anesthesia Plan: Spinal   Post-op Pain Management:    Induction:   Airway Management Planned:   Additional Equipment:   Intra-op Plan:   Post-operative Plan:   Informed Consent: I have reviewed the patients History and Physical, chart, labs and discussed the procedure including the risks, benefits and alternatives for the proposed anesthesia with the patient or authorized representative who has indicated his/her understanding and acceptance.   Dental advisory given  Plan Discussed with: CRNA  Anesthesia Plan Comments:        Anesthesia Quick Evaluation

## 2013-06-29 DIAGNOSIS — E669 Obesity, unspecified: Secondary | ICD-10-CM | POA: Diagnosis present

## 2013-06-29 DIAGNOSIS — D500 Iron deficiency anemia secondary to blood loss (chronic): Secondary | ICD-10-CM | POA: Diagnosis not present

## 2013-06-29 LAB — BASIC METABOLIC PANEL
CO2: 27 mEq/L (ref 19–32)
Chloride: 104 mEq/L (ref 96–112)
Creatinine, Ser: 1.15 mg/dL — ABNORMAL HIGH (ref 0.50–1.10)
Sodium: 141 mEq/L (ref 135–145)

## 2013-06-29 LAB — CBC
HCT: 35.1 % — ABNORMAL LOW (ref 36.0–46.0)
Hemoglobin: 11.9 g/dL — ABNORMAL LOW (ref 12.0–15.0)
MCV: 89.3 fL (ref 78.0–100.0)
Platelets: 156 10*3/uL (ref 150–400)
RBC: 3.93 MIL/uL (ref 3.87–5.11)
WBC: 10.8 10*3/uL — ABNORMAL HIGH (ref 4.0–10.5)

## 2013-06-29 MED ORDER — POLYETHYLENE GLYCOL 3350 17 G PO PACK: 17.0000 g | PACK | Freq: Two times a day (BID) | ORAL | Status: DC

## 2013-06-29 MED ORDER — FERROUS SULFATE 325 (65 FE) MG PO TABS: 325.0000 mg | ORAL_TABLET | Freq: Three times a day (TID) | ORAL | Status: DC

## 2013-06-29 MED ORDER — DSS 100 MG PO CAPS: 100.0000 mg | ORAL_CAPSULE | Freq: Two times a day (BID) | ORAL | Status: DC

## 2013-06-29 MED ORDER — ASPIRIN 325 MG PO TBEC: 325.0000 mg | DELAYED_RELEASE_TABLET | Freq: Two times a day (BID) | ORAL | Status: AC

## 2013-06-29 MED ORDER — TIZANIDINE HCL 4 MG PO TABS: 4.0000 mg | ORAL_TABLET | Freq: Four times a day (QID) | ORAL | Status: DC | PRN

## 2013-06-29 MED ORDER — HYDROCODONE-ACETAMINOPHEN 7.5-325 MG PO TABS: 1.0000 | ORAL_TABLET | ORAL | Status: DC | PRN

## 2013-06-29 NOTE — Progress Notes (Signed)
[facility]   [facility] is providing the following services: patient declined rw and commode.  Has equipment at home.   If patient discharges after hours, please call [phone].   Linward Headland 06/29/2013, 11:12 AM

## 2013-06-29 NOTE — Progress Notes (Signed)
Subjective: 1 Day Post-Op Procedure(s) (LRB): LEFT TOTAL  HIP ARTHROPLASTY ANTERIOR APPROACH (Left)   Patient reports pain as mild, pain well controlled. No events throughout the night. Ready to be discharged home.   Objective:   VITALS:   Filed Vitals:   06/29/13 0630  BP: 123/74  Pulse: 73  Temp: 97.6 F (36.4 C)  Resp: 16    Neurovascular intact Dorsiflexion/Plantar flexion intact Incision: dressing C/D/I No cellulitis present Compartment soft  LABS  Recent Labs  06/29/13 0455  HGB 11.9*  HCT 35.1*  WBC 10.8*  PLT 156     Recent Labs  06/29/13 0455  NA 141  K 4.4  BUN 15  CREATININE 1.15*  GLUCOSE 142*     Assessment/Plan: 1 Day Post-Op Procedure(s) (LRB): LEFT TOTAL  HIP ARTHROPLASTY ANTERIOR APPROACH (Left) Foley cath d/c'ed Advance diet Up with therapy D/C IV fluids Discharge home with home health Follow up in 2 weeks at [facility]. Follow up with OLIN,Fynlee Rowlands D in 2 weeks.  Contact information:  [facility] [address], [place] [phone]    Expected ABLA  Treated with iron and will observe  Obese (BMI 30-39.9) Estimated body mass index is 31.74 kg/(m^2) as calculated from the following:   Height as of this encounter: 5' 4" (1.626 m).   Weight as of this encounter: 83.915 kg (185 lb). Patient also counseled that weight may inhibit the healing process Patient counseled that losing weight will help with future health issues       Kimberly Irwin   PAC  06/29/2013, 9:05 AM

## 2013-06-29 NOTE — Progress Notes (Signed)
Physical Therapy Treatment Note   06/29/13 1444  PT Visit Information  Last PT Received On 06/29/13  Assistance Needed +1  PT Time Calculation  PT Start Time 1332  PT Stop Time 1340  PT Time Calculation (min) 8 min  Precautions  Precautions Fall  Restrictions  Other Position/Activity Restrictions WBAT  Cognition  Arousal/Alertness Awake/alert  Behavior During Therapy WFL for tasks assessed/performed  Overall Cognitive Status Within Functional Limits for tasks assessed  Bed Mobility  Bed Mobility Not assessed  Transfers  Transfers Sit to Stand;Stand to Sit  Sit to Stand 5: Supervision;With upper extremity assist;From chair/3-in-1  Stand to Sit 5: Supervision;With upper extremity assist;To chair/3-in-1  Details for Transfer Assistance performed safely  Ambulation/Gait  Ambulation/Gait Assistance 5: Supervision  Ambulation Distance (Feet) 200 Feet  Assistive device Rolling walker  Ambulation/Gait Assistance Details verbal cue for RW distance  Gait Pattern Step-through pattern;Antalgic;Decreased step length - right  Stairs Yes  Stairs Assistance 5: Supervision  Stairs Assistance Details (indicate cue type and reason) verbal cue for sequence and safe technique  Stair Management Technique Step to pattern;Forwards;With walker  Number of Stairs 1 (performed twice)  PT - End of Session  Activity Tolerance Patient tolerated treatment well  Patient left in chair;with call bell/phone within reach;with family/visitor present  PT - Assessment/Plan  PT Plan Current plan remains appropriate  Follow Up Recommendations Home health PT;Supervision for mobility/OOB  PT equipment None recommended by PT  PT Goal Progression  Progress towards PT goals Progressing toward goals  PT General Charges  $$ ACUTE PT VISIT 1 Procedure  PT Treatments  $Gait Training 8-22 mins  Pt reports premedicated and ready for d/c.  Pt had no further questions/concerns.  Carmelia Bake, PT, DPT 06/29/2013 Pager:  [phone]

## 2013-06-29 NOTE — Care Management Note (Signed)
    Page 1 of 1   06/29/2013     11:37:37 AM   CARE MANAGEMENT NOTE 06/29/2013  Patient:  Kimberly Irwin, Kimberly Irwin   Account Number:  [id number]  Date Initiated:  06/29/2013  Documentation initiated by:  Sherrin Daisy  Subjective/Objective Assessment:   dx Left hip replacemnt-anterior approach    Per-arranged with Arville Go for [facility] services. Services to start Friday 07/01/2013     Action/Plan:   CM spoke with patient. Plans are for her to return to her home in [facility]) where daughters will be her caregivers. She will have someone with her at all times. Pt already has RW, cane, shower chair and commode seat.   Anticipated DC Date:  06/29/2013   Anticipated DC Plan:  [place]  CM consult      [facility] Choice  HOME HEALTH   Choice offered to / List presented to:  C-1 Patient        [place] arranged  HH-2 PT      [place]   Status of service:  Completed, signed off Medicare Important Message given?  NA - LOS <3 / Initial given by admissions (If response is "NO", the following Medicare IM given date fields will be blank) Date Medicare IM given:   Date Additional Medicare IM given:    Discharge Disposition:  [place]  Per UR Regulation:    If discussed at Long Length of Stay Meetings, dates discussed:    Comments:

## 2013-06-29 NOTE — Evaluation (Signed)
Occupational Therapy Evaluation Patient Details Name: Kimberly Irwin MRN: [id number] DOB: 1945/09/29 Today's Date: 06/29/2013 Time: 4481-8563 OT Time Calculation (min): 22 min  OT Assessment / Plan / Recommendation History of present illness pt was admitted for L DA THA.     Clinical Impression   Pt was admitted for L DA THA.  She has had multiple orthopedic surgeries and has all DME.  She will have daughters helping for 24/7 initially.  During eval, pt had one loss of balance in bathroom; cued for allowing help at home.  Also educated on energy conservation as she has dyspnea with a little activity.  All education was completed.      OT Assessment  Patient does not need any further OT services    Follow Up Recommendations  Supervision/Assistance - 24 hour;No OT follow up    Barriers to Discharge      Equipment Recommendations  None recommended by OT    Recommendations for Other Services    Frequency       Precautions / Restrictions Precautions Precautions: Fall Restrictions Weight Bearing Restrictions: No   Pertinent Vitals/Pain Pain is OK; repositioned    ADL  Toilet Transfer: Minimal assistance Toilet Transfer Method: Sit to stand Toilet Transfer Equipment: Comfort height toilet;Grab bars Toileting - Clothing Manipulation and Hygiene: Supervision/safety Where Assessed - Toileting Clothing Manipulation and Hygiene: Sit to stand from 3-in-1 or toilet Equipment Used: Rolling walker Transfers/Ambulation Related to ADLs: ambulated to bathroom then back to chair:  dyspnea 2/4; 1 LOB when turning to flush commode ADL Comments: Pt will have daughters help with adls. She also has AE kit, but feels she will have them help instead of using these items.  Did not want to review.  overall, UB set up; LB mod A bathing; max A dressing without AE.  Educated on rest breaks/energy conservation due to dyspnea.  Pt has been limited with activity prior to admission due to pain    OT  Diagnosis:    OT Problem List:   OT Treatment Interventions:     OT Goals(Current goals can be found in the care plan section)    Visit Information  Last OT Received On: 06/29/13 Assistance Needed: +1 History of Present Illness: pt was admitted for L DA THA.         Prior [place] expects to be discharged to:: Private residence Living Arrangements: [place]: Bedside commode (AE kit) Prior Function Level of Independence: Independent with assistive device(s) Communication Communication: No difficulties         Vision/Perception     Cognition  Cognition Arousal/Alertness: Awake/alert Behavior During Therapy: WFL for tasks assessed/performed Overall Cognitive Status: Within Functional Limits for tasks assessed    Extremity/Trunk Assessment Upper Extremity Assessment Upper Extremity Assessment: Overall WFL for tasks assessed (has tremors head and UEs)     Mobility Bed Mobility Supine to Sit: 4: Min guard;With rails;HOB elevated Details for Bed Mobility Assistance: pt feels she will sleep on reclining love seat.  Got up on L side today Transfers Sit to Stand: 4: Min guard;4: Min assist;From chair/3-in-1;From bed;With armrests;With upper extremity assist Details for Transfer Assistance: min guard from toilet with grab bar     Exercise     Balance     End of Session OT - End of Session Activity Tolerance: Patient tolerated treatment well Patient left: in chair;with call bell/phone within reach  [place] 06/29/2013, 9:21 AM  Lesle Chris, OTR/L [phone] 06/29/2013

## 2013-06-29 NOTE — Plan of Care (Signed)
Problem: Consults Goal: Diagnosis- Total Joint Replacement Primary Total Hip

## 2013-06-29 NOTE — Progress Notes (Signed)
Physical Therapy Treatment Patient Details Name: Kimberly Irwin MRN: [id number] DOB: 03-18-1946 Today's Date: 06/29/2013 Time: 1022-1050 PT Time Calculation (min): 28 min  PT Assessment / Plan / Recommendation  History of Present Illness pt was admitted for L DA THA.     PT Comments   Pt progressing well and ambulated in hallway as well as performed exercises.  Pt provided with HEP handout.  Pt plans to d/c home after afternoon therapy session.  Follow Up Recommendations  Home health PT;Supervision for mobility/OOB     Does the patient have the potential to tolerate intense rehabilitation     Barriers to Discharge        Equipment Recommendations  None recommended by PT    Recommendations for Other Services    Frequency 7X/week   Progress towards PT Goals Progress towards PT goals: Progressing toward goals  Plan Current plan remains appropriate    Precautions / Restrictions Precautions Precautions: Fall Restrictions Weight Bearing Restrictions: No Other Position/Activity Restrictions: WBAT   Pertinent Vitals/Pain None at rest however reports some pain with mobility and exercises (not rated), repositioned, pt states she prefers not to take pain meds unless absolutely necessary and aware to call out if pain increases    Mobility  Bed Mobility Bed Mobility: Not assessed Supine to Sit: 4: Min guard;With rails;HOB elevated Details for Bed Mobility Assistance: pt feels she will sleep on reclining love seat.  Got up on L side today Transfers Transfers: Sit to Stand;Stand to Sit Sit to Stand: 4: Min guard;From toilet;From chair/3-in-1;With upper extremity assist Stand to Sit: To toilet;To chair/3-in-1;4: Min guard;With upper extremity assist Details for Transfer Assistance: verbal cues for safe technique Ambulation/Gait Ambulation/Gait Assistance: 4: Min guard Ambulation Distance (Feet): 140 Feet Assistive device: Rolling walker Ambulation/Gait Assistance Details: verbal cues  for sequence, step length, RW distance Gait Pattern: Step-through pattern;Antalgic;Decreased step length - right    Exercises Total Joint Exercises Ankle Circles/Pumps: AROM;Both;20 reps;Supine Quad Sets: AROM;Both;20 reps;Supine Gluteal Sets: AROM;Both;20 reps;Supine Short Arc Quad: AROM;Left;Supine;20 reps Heel Slides: AAROM;Left;Supine;20 reps Hip ABduction/ADduction: AAROM;Left;20 reps;Supine   PT Diagnosis:    PT Problem List:   PT Treatment Interventions:     PT Goals (current goals can now be found in the care plan section)    Visit Information  Last PT Received On: 06/29/13 Assistance Needed: +1 History of Present Illness: pt was admitted for L DA THA.      Subjective Data      Cognition  Cognition Arousal/Alertness: Awake/alert Behavior During Therapy: WFL for tasks assessed/performed Overall Cognitive Status: Within Functional Limits for tasks assessed    Balance     End of Session PT - End of Session Activity Tolerance: Patient tolerated treatment well Patient left: in chair;with call bell/phone within reach   GP     [facility] 06/29/2013, 11:37 AM Carmelia Bake, PT, DPT 06/29/2013 Pager: [phone]

## 2013-07-01 DIAGNOSIS — Z471 Aftercare following joint replacement surgery: Secondary | ICD-10-CM | POA: Diagnosis not present

## 2013-07-01 DIAGNOSIS — F3289 Other specified depressive episodes: Secondary | ICD-10-CM | POA: Diagnosis not present

## 2013-07-01 DIAGNOSIS — Z96649 Presence of unspecified artificial hip joint: Secondary | ICD-10-CM | POA: Diagnosis not present

## 2013-07-01 DIAGNOSIS — IMO0001 Reserved for inherently not codable concepts without codable children: Secondary | ICD-10-CM | POA: Diagnosis not present

## 2013-07-01 DIAGNOSIS — F329 Major depressive disorder, single episode, unspecified: Secondary | ICD-10-CM | POA: Diagnosis not present

## 2013-07-04 DIAGNOSIS — F329 Major depressive disorder, single episode, unspecified: Secondary | ICD-10-CM | POA: Diagnosis not present

## 2013-07-04 DIAGNOSIS — IMO0001 Reserved for inherently not codable concepts without codable children: Secondary | ICD-10-CM | POA: Diagnosis not present

## 2013-07-04 DIAGNOSIS — F3289 Other specified depressive episodes: Secondary | ICD-10-CM | POA: Diagnosis not present

## 2013-07-04 DIAGNOSIS — Z471 Aftercare following joint replacement surgery: Secondary | ICD-10-CM | POA: Diagnosis not present

## 2013-07-04 DIAGNOSIS — Z96649 Presence of unspecified artificial hip joint: Secondary | ICD-10-CM | POA: Diagnosis not present

## 2013-07-04 NOTE — Discharge Summary (Signed)
Physician Discharge Summary  Patient ID: Kimberly Irwin MRN: [id number] DOB/AGE: February 13, 1946 67 y.o.  Admit date: 06/28/2013 Discharge date: 06/29/2013   Procedures:  Procedure(s) (LRB): LEFT TOTAL  HIP ARTHROPLASTY ANTERIOR APPROACH (Left)  Attending Physician:  Dr. Paralee Cancel   Admission Diagnoses:   Left hip OA / pain  Discharge Diagnoses:  Principal Problem:   S/P left THA, AA Active Problems:   Obese   Expected blood loss anemia  Past Medical History  Diagnosis Date  . Sarcoidosis   . Hyperlipidemia   . GERD (gastroesophageal reflux disease)   . Hypertension   . Chronic low back pain   . DDD (degenerative disc disease)     L3-4 with facet arthropathy and stenosis  . Degenerative spondylolisthesis     L4-5 grade 1 with stenosis  . Acute hip pain 10/26/2012  . Bursitis of left hip 10/26/2012  . Pain in joint, lower leg 10/26/2012    left   . Tremors of nervous system     HPI: Kimberly Irwin, 67 y.o. female, has a history of pain and functional disability in the left hip(s) due to arthritis and patient has failed non-surgical conservative treatments for greater than 12 weeks to include NSAID's and/or analgesics, corticosteriod injections and activity modification. Onset of symptoms was gradual starting 1+ years ago with rapidlly worsening course since that time.The patient noted no past surgery on the left hip(s). Patient currently rates pain in the left hip at 7 out of 10 with activity. Patient has worsening of pain with activity and weight bearing, trendelenberg gait, pain that interfers with activities of daily living and pain with passive range of motion. Patient has evidence of periarticular osteophytes and joint space narrowing by imaging studies. This condition presents safety issues increasing the risk of falls. There is no current active infection. Risks, benefits and expectations were discussed with the patient. Risks including but not limited to the risk of  anesthesia, blood clots, nerve damage, blood vessel damage, failure of the prosthesis, infection and up to and including death. Patient understand the risks, benefits and expectations and wishes to proceed with surgery.  PCP: Penni Homans, MD   Discharged Condition: good  Hospital Course:  Patient underwent the above stated procedure on 06/28/2013. Patient tolerated the procedure well and brought to the recovery room in good condition and subsequently to the floor.  POD #1 BP: 123/74 ; Pulse: 73 ; Temp: 97.6 F (36.4 C) ; Resp: 16  Pt's foley was removed. IV was changed to a saline lock. Patient reports pain as mild, pain well controlled. No events throughout the night. Ready to be discharged home. Neurovascular intact, dorsiflexion/plantar flexion intact, incision: dressing C/D/I, no cellulitis present and compartment soft.   LABS  Basename    HGB  11.9  HCT  35.1    Discharge Exam: General appearance: alert, cooperative and no distress Extremities: Homans sign is negative, no sign of DVT, no edema, redness or tenderness in the calves or thighs and no ulcers, gangrene or trophic changes  Disposition:    Home-Health Care Svc with follow up in 2 weeks   Follow-up Information   Follow up with Mauri Pole, MD. Schedule an appointment as soon as possible for a visit in 2 weeks.   Specialty:  Orthopedic Surgery   Contact information:   [address] [place] [address] [phone]       Discharge Orders   Future Appointments Provider Department Dept Phone  08/16/2013 10:00 AM Mosie Lukes, MD [facility]  [place]   08/17/2013 8:45 AM Hayden Pedro, MD [facility] [phone]   Future Orders Complete By Expires   Call MD / Call 911  As directed    Comments:     If you experience chest pain or shortness of breath, CALL 911 and be transported to the hospital emergency room.  If you develope a fever  above 101 F, pus (white drainage) or increased drainage or redness at the wound, or calf pain, call your surgeon's office.   Change dressing  As directed    Comments:     Maintain surgical dressing for 10-14 days, then replace with 4x4 guaze and tape. Keep the area dry and clean.   Constipation Prevention  As directed    Comments:     Drink plenty of fluids.  Prune juice may be helpful.  You may use a stool softener, such as Colace (over the counter) 100 mg twice a day.  Use MiraLax (over the counter) for constipation as needed.   Diet - low sodium heart healthy  As directed    Discharge instructions  As directed    Comments:     Maintain surgical dressing for 10-14 days, then replace with gauze and tape. Keep the area dry and clean until follow up. Follow up in 2 weeks at [facility]. Call with any questions or concerns.   Driving restrictions  As directed    Comments:     No driving for 4 weeks   Increase activity slowly as tolerated  As directed    TED hose  As directed    Comments:     Use stockings (TED hose) for 2 weeks on both leg(s).  You may remove them at night for sleeping.   Weight bearing as tolerated  As directed    Questions:     Laterality:     Extremity:          Medication List    STOP taking these medications       aspirin 81 MG tablet  Replaced by:  aspirin 325 MG EC tablet     traMADol 50 MG tablet  Commonly known as:  ULTRAM      TAKE these medications       aspirin 325 MG EC tablet  Take 1 tablet (325 mg total) by mouth 2 (two) times daily.     bisoprolol 5 MG tablet  Commonly known as:  ZEBETA  Take 5 mg by mouth every morning.     CALCIUM 1200 PO  Take 1 tablet by mouth daily.     celecoxib 200 MG capsule  Commonly known as:  CELEBREX  Take 200 mg by mouth daily.     Cinnamon 500 MG capsule  Take 2,000 mg by mouth daily.     Coenzyme Q10 200 MG capsule  Take 200 mg by mouth daily.     DSS 100 MG Caps  Take 100 mg by  mouth 2 (two) times daily.     ferrous sulfate 325 (65 FE) MG tablet  Take 1 tablet (325 mg total) by mouth 3 (three) times daily after meals.     Fish Oil 1200 MG Caps  Take 2 capsules by mouth daily.     gabapentin 300 MG capsule  Commonly known as:  NEURONTIN  Take 300 mg by mouth 3 (three) times daily as needed (pain).  hydrochlorothiazide 25 MG tablet  Commonly known as:  HYDRODIURIL  Take 25 mg by mouth every morning.     HYDROcodone-acetaminophen 7.5-325 MG per tablet  Commonly known as:  NORCO  Take 1-2 tablets by mouth every 4 (four) hours as needed for moderate pain.     levothyroxine 112 MCG tablet  Commonly known as:  SYNTHROID, LEVOTHROID  Take 112 mcg by mouth daily before breakfast.     NEXIUM PO  Take by mouth daily.     polyethylene glycol packet  Commonly known as:  MIRALAX / GLYCOLAX  Take 17 g by mouth 2 (two) times daily.     rosuvastatin 10 MG tablet  Commonly known as:  CRESTOR  Take 5 mg by mouth every evening.     tiZANidine 4 MG tablet  Commonly known as:  ZANAFLEX  Take 1 tablet (4 mg total) by mouth every 6 (six) hours as needed for muscle spasms.     Vitamin D 2000 UNITS tablet  Take 2,000 Units by mouth daily.         Signed: [place]. Tamella Tuccillo   PAC  07/04/2013, 5:23 PM

## 2013-07-06 DIAGNOSIS — F329 Major depressive disorder, single episode, unspecified: Secondary | ICD-10-CM | POA: Diagnosis not present

## 2013-07-06 DIAGNOSIS — Z96649 Presence of unspecified artificial hip joint: Secondary | ICD-10-CM | POA: Diagnosis not present

## 2013-07-06 DIAGNOSIS — F3289 Other specified depressive episodes: Secondary | ICD-10-CM | POA: Diagnosis not present

## 2013-07-06 DIAGNOSIS — Z471 Aftercare following joint replacement surgery: Secondary | ICD-10-CM | POA: Diagnosis not present

## 2013-07-06 DIAGNOSIS — IMO0001 Reserved for inherently not codable concepts without codable children: Secondary | ICD-10-CM | POA: Diagnosis not present

## 2013-07-08 DIAGNOSIS — IMO0001 Reserved for inherently not codable concepts without codable children: Secondary | ICD-10-CM | POA: Diagnosis not present

## 2013-07-08 DIAGNOSIS — Z471 Aftercare following joint replacement surgery: Secondary | ICD-10-CM | POA: Diagnosis not present

## 2013-07-08 DIAGNOSIS — Z96649 Presence of unspecified artificial hip joint: Secondary | ICD-10-CM | POA: Diagnosis not present

## 2013-07-08 DIAGNOSIS — F329 Major depressive disorder, single episode, unspecified: Secondary | ICD-10-CM | POA: Diagnosis not present

## 2013-07-08 DIAGNOSIS — F3289 Other specified depressive episodes: Secondary | ICD-10-CM | POA: Diagnosis not present

## 2013-07-11 DIAGNOSIS — IMO0001 Reserved for inherently not codable concepts without codable children: Secondary | ICD-10-CM | POA: Diagnosis not present

## 2013-07-11 DIAGNOSIS — Z471 Aftercare following joint replacement surgery: Secondary | ICD-10-CM | POA: Diagnosis not present

## 2013-07-11 DIAGNOSIS — Z96649 Presence of unspecified artificial hip joint: Secondary | ICD-10-CM | POA: Diagnosis not present

## 2013-07-11 DIAGNOSIS — F329 Major depressive disorder, single episode, unspecified: Secondary | ICD-10-CM | POA: Diagnosis not present

## 2013-07-11 DIAGNOSIS — F3289 Other specified depressive episodes: Secondary | ICD-10-CM | POA: Diagnosis not present

## 2013-07-13 ENCOUNTER — Telehealth: Payer: Self-pay | Admitting: Family Medicine

## 2013-07-13 DIAGNOSIS — F3289 Other specified depressive episodes: Secondary | ICD-10-CM | POA: Diagnosis not present

## 2013-07-13 DIAGNOSIS — F329 Major depressive disorder, single episode, unspecified: Secondary | ICD-10-CM | POA: Diagnosis not present

## 2013-07-13 DIAGNOSIS — Z471 Aftercare following joint replacement surgery: Secondary | ICD-10-CM | POA: Diagnosis not present

## 2013-07-13 DIAGNOSIS — IMO0001 Reserved for inherently not codable concepts without codable children: Secondary | ICD-10-CM | POA: Diagnosis not present

## 2013-07-13 DIAGNOSIS — Z96649 Presence of unspecified artificial hip joint: Secondary | ICD-10-CM | POA: Diagnosis not present

## 2013-07-13 NOTE — Telephone Encounter (Signed)
Called pt gave her call a nurse number

## 2013-07-13 NOTE — Telephone Encounter (Signed)
Pt BP low 88/60, request call back asap, call pts house if its in the 20 mins

## 2013-07-13 NOTE — Telephone Encounter (Signed)
Please have her call triage. Thanks.

## 2013-07-13 NOTE — Telephone Encounter (Signed)
Please advise?

## 2013-07-13 NOTE — Telephone Encounter (Signed)
Per Tommie Ard, RN  Patient Information: Caller Name: Daniah Phone: [phone] Patient: Kimberly Irwin Gender: Female DOB: Jun 13, 1946 Age: 67 Years PCP: Penni Homans [facility])  Office Follow Up: Does the office need to follow up with this patient?: Yes Instructions For The Office: Patient is recuperating at home from hip replacement. Unable to come to office appt.  Low blood pressure this morning. Advised her that I would forward concerns to the office. Please contact patient. She has taken morning medication (HCTZ & Bisoprolol). ?Medication adjustment /recovery from surgery  RN Note: Patient is recuperating at home from hip replacement. Unable to come to office appt.  Low blood pressure this morning. Advised her that I would forward concerns to the office. Please contact patient. She has taken morning medication (HCTZ & Bisoprolol). ?Medication adjustment /recovery from surgery  Symptoms Reason For Call & Symptoms: Patient states she had hip replacement two weeks ago 06/28/13 . Her therapist took her blood pressure this morning it was 88/54.  Repeat is 95/64. She reports appetite is well.  She reports she takes HCTZ 58m and Bisoprolol 529mdaily. she is not dizzy but "my head is not right".  (she is on pain medication and muscle relaxers Hydrocodone 7.36m736mvery 4 hours , Tizanidine 4mg59mery 6 hours) Reviewed Health History In EMR: Yes Reviewed Medications In EMR: Yes Reviewed Allergies In EMR: Yes Reviewed Surgeries / Procedures: Yes Date of Onset of Symptoms: 07/13/2013  Guideline(s) Used: High Blood Pressure  Disposition Per Guideline:   Discuss with PCP and Callback by Nurse Today  Reason For Disposition Reached:   Taking BP medications and feels is having side effects (e.g., impotence, cough, dizziness)  Advice Given: Call Back If: Headache, blurred vision, difficulty talking, or difficulty walking occurs Chest pain or difficulty breathing occurs You become  worse.  RN Overrode Recommendation: Document Patient Patient is recuperating at home from hip replacement. Unable to come to office appt.  Low blood pressure this morning. Advised her that I would forward concerns to the office. Please contact patient. She has taken morning medication (HCTZ & Bisoprolol). ?Medication adjustment /recovery from surgery

## 2013-07-13 NOTE — Telephone Encounter (Signed)
I agree needs to talk to CAN to triage

## 2013-07-13 NOTE — Telephone Encounter (Signed)
Patient should check her blood pressure in the am before taking her medications.  If BP still low, patient should call before taking her medications.  If blood pressure seems consistently low, make sure patient is well-hydrated and return to office for assessment and possible medication changes.  If she develops chest pain, palpitations, lightheadedness or dizziness, then she should be taken to the ER.

## 2013-07-15 DIAGNOSIS — F329 Major depressive disorder, single episode, unspecified: Secondary | ICD-10-CM | POA: Diagnosis not present

## 2013-07-15 DIAGNOSIS — F3289 Other specified depressive episodes: Secondary | ICD-10-CM | POA: Diagnosis not present

## 2013-07-15 DIAGNOSIS — Z471 Aftercare following joint replacement surgery: Secondary | ICD-10-CM | POA: Diagnosis not present

## 2013-07-15 DIAGNOSIS — IMO0001 Reserved for inherently not codable concepts without codable children: Secondary | ICD-10-CM | POA: Diagnosis not present

## 2013-07-15 DIAGNOSIS — Z96649 Presence of unspecified artificial hip joint: Secondary | ICD-10-CM | POA: Diagnosis not present

## 2013-07-18 DIAGNOSIS — F329 Major depressive disorder, single episode, unspecified: Secondary | ICD-10-CM | POA: Diagnosis not present

## 2013-07-18 DIAGNOSIS — Z96649 Presence of unspecified artificial hip joint: Secondary | ICD-10-CM | POA: Diagnosis not present

## 2013-07-18 DIAGNOSIS — Z471 Aftercare following joint replacement surgery: Secondary | ICD-10-CM | POA: Diagnosis not present

## 2013-07-18 DIAGNOSIS — F3289 Other specified depressive episodes: Secondary | ICD-10-CM | POA: Diagnosis not present

## 2013-07-18 DIAGNOSIS — IMO0001 Reserved for inherently not codable concepts without codable children: Secondary | ICD-10-CM | POA: Diagnosis not present

## 2013-07-19 NOTE — Telephone Encounter (Signed)
Thank you for informing us.  I will forward this to Dr. Charlett Blake who is the patient's PCP, so she can be made aware.

## 2013-07-19 NOTE — Telephone Encounter (Signed)
Spoke with pt, she states that they have discovered that the medication she was taking for muscle spasms was causing her BP to drop low.  She has contacted her surgeon's office and they have called her in a new medication but she cannot recall the name of it. States she will start taking it tomorrow.

## 2013-07-20 DIAGNOSIS — Z471 Aftercare following joint replacement surgery: Secondary | ICD-10-CM | POA: Diagnosis not present

## 2013-07-20 DIAGNOSIS — F329 Major depressive disorder, single episode, unspecified: Secondary | ICD-10-CM | POA: Diagnosis not present

## 2013-07-20 DIAGNOSIS — F3289 Other specified depressive episodes: Secondary | ICD-10-CM | POA: Diagnosis not present

## 2013-07-20 DIAGNOSIS — IMO0001 Reserved for inherently not codable concepts without codable children: Secondary | ICD-10-CM | POA: Diagnosis not present

## 2013-07-20 DIAGNOSIS — Z96649 Presence of unspecified artificial hip joint: Secondary | ICD-10-CM | POA: Diagnosis not present

## 2013-07-21 DIAGNOSIS — F3289 Other specified depressive episodes: Secondary | ICD-10-CM | POA: Diagnosis not present

## 2013-07-21 DIAGNOSIS — Z471 Aftercare following joint replacement surgery: Secondary | ICD-10-CM | POA: Diagnosis not present

## 2013-07-21 DIAGNOSIS — IMO0001 Reserved for inherently not codable concepts without codable children: Secondary | ICD-10-CM | POA: Diagnosis not present

## 2013-07-21 DIAGNOSIS — F329 Major depressive disorder, single episode, unspecified: Secondary | ICD-10-CM | POA: Diagnosis not present

## 2013-07-21 DIAGNOSIS — Z96649 Presence of unspecified artificial hip joint: Secondary | ICD-10-CM | POA: Diagnosis not present

## 2013-07-25 DIAGNOSIS — F3289 Other specified depressive episodes: Secondary | ICD-10-CM | POA: Diagnosis not present

## 2013-07-25 DIAGNOSIS — Z96649 Presence of unspecified artificial hip joint: Secondary | ICD-10-CM | POA: Diagnosis not present

## 2013-07-25 DIAGNOSIS — F329 Major depressive disorder, single episode, unspecified: Secondary | ICD-10-CM | POA: Diagnosis not present

## 2013-07-25 DIAGNOSIS — Z471 Aftercare following joint replacement surgery: Secondary | ICD-10-CM | POA: Diagnosis not present

## 2013-07-25 DIAGNOSIS — IMO0001 Reserved for inherently not codable concepts without codable children: Secondary | ICD-10-CM | POA: Diagnosis not present

## 2013-07-26 DIAGNOSIS — F329 Major depressive disorder, single episode, unspecified: Secondary | ICD-10-CM | POA: Diagnosis not present

## 2013-07-26 DIAGNOSIS — Z96649 Presence of unspecified artificial hip joint: Secondary | ICD-10-CM | POA: Diagnosis not present

## 2013-07-26 DIAGNOSIS — IMO0001 Reserved for inherently not codable concepts without codable children: Secondary | ICD-10-CM | POA: Diagnosis not present

## 2013-07-26 DIAGNOSIS — F3289 Other specified depressive episodes: Secondary | ICD-10-CM | POA: Diagnosis not present

## 2013-07-26 DIAGNOSIS — Z471 Aftercare following joint replacement surgery: Secondary | ICD-10-CM | POA: Diagnosis not present

## 2013-07-27 DIAGNOSIS — Z471 Aftercare following joint replacement surgery: Secondary | ICD-10-CM | POA: Diagnosis not present

## 2013-07-27 DIAGNOSIS — F3289 Other specified depressive episodes: Secondary | ICD-10-CM | POA: Diagnosis not present

## 2013-07-27 DIAGNOSIS — IMO0001 Reserved for inherently not codable concepts without codable children: Secondary | ICD-10-CM | POA: Diagnosis not present

## 2013-07-27 DIAGNOSIS — F329 Major depressive disorder, single episode, unspecified: Secondary | ICD-10-CM | POA: Diagnosis not present

## 2013-07-27 DIAGNOSIS — Z96649 Presence of unspecified artificial hip joint: Secondary | ICD-10-CM | POA: Diagnosis not present

## 2013-08-16 ENCOUNTER — Encounter: Payer: Self-pay | Admitting: Family Medicine

## 2013-08-16 ENCOUNTER — Ambulatory Visit (INDEPENDENT_AMBULATORY_CARE_PROVIDER_SITE_OTHER): Payer: Medicare Other | Admitting: Family Medicine

## 2013-08-16 VITALS — BP 138/90 | HR 71 | Temp 97.9°F | Ht 64.0 in | Wt 181.1 lb

## 2013-08-16 DIAGNOSIS — E785 Hyperlipidemia, unspecified: Secondary | ICD-10-CM | POA: Diagnosis not present

## 2013-08-16 DIAGNOSIS — R259 Unspecified abnormal involuntary movements: Secondary | ICD-10-CM

## 2013-08-16 DIAGNOSIS — R739 Hyperglycemia, unspecified: Secondary | ICD-10-CM

## 2013-08-16 DIAGNOSIS — R7309 Other abnormal glucose: Secondary | ICD-10-CM | POA: Diagnosis not present

## 2013-08-16 DIAGNOSIS — E669 Obesity, unspecified: Secondary | ICD-10-CM

## 2013-08-16 DIAGNOSIS — E039 Hypothyroidism, unspecified: Secondary | ICD-10-CM

## 2013-08-16 DIAGNOSIS — R251 Tremor, unspecified: Secondary | ICD-10-CM

## 2013-08-16 DIAGNOSIS — I10 Essential (primary) hypertension: Secondary | ICD-10-CM

## 2013-08-16 DIAGNOSIS — R52 Pain, unspecified: Secondary | ICD-10-CM

## 2013-08-16 LAB — HEPATIC FUNCTION PANEL
ALBUMIN: 4.4 g/dL (ref 3.5–5.2)
ALK PHOS: 66 U/L (ref 39–117)
ALT: 28 U/L (ref 0–35)
AST: 28 U/L (ref 0–37)
BILIRUBIN TOTAL: 0.7 mg/dL (ref 0.3–1.2)
Bilirubin, Direct: 0.1 mg/dL (ref 0.0–0.3)
Indirect Bilirubin: 0.6 mg/dL (ref 0.0–0.9)
TOTAL PROTEIN: 7.5 g/dL (ref 6.0–8.3)

## 2013-08-16 LAB — LIPID PANEL
Cholesterol: 189 mg/dL (ref 0–200)
HDL: 45 mg/dL (ref >39)
LDL Cholesterol: 101 mg/dL — ABNORMAL HIGH (ref 0–99)
Total CHOL/HDL Ratio: 4.2 Ratio
Triglycerides: 214 mg/dL — ABNORMAL HIGH (ref <150)
VLDL: 43 mg/dL — ABNORMAL HIGH (ref 0–40)

## 2013-08-16 LAB — CBC
HCT: 45.5 % (ref 36.0–46.0)
HEMOGLOBIN: 16 g/dL — AB (ref 12.0–15.0)
MCH: 30.7 pg (ref 26.0–34.0)
MCHC: 35.2 g/dL (ref 30.0–36.0)
MCV: 87.2 fL (ref 78.0–100.0)
Platelets: 251 10*3/uL (ref 150–400)
RBC: 5.22 MIL/uL — ABNORMAL HIGH (ref 3.87–5.11)
RDW: 13.9 % (ref 11.5–15.5)
WBC: 12.5 10*3/uL — ABNORMAL HIGH (ref 4.0–10.5)

## 2013-08-16 LAB — RENAL FUNCTION PANEL
Albumin: 4.4 g/dL (ref 3.5–5.2)
BUN: 17 mg/dL (ref 6–23)
CALCIUM: 10.2 mg/dL (ref 8.4–10.5)
CHLORIDE: 99 meq/L (ref 96–112)
CO2: 28 mEq/L (ref 19–32)
Creat: 0.87 mg/dL (ref 0.50–1.10)
Glucose, Bld: 97 mg/dL (ref 70–99)
PHOSPHORUS: 3.7 mg/dL (ref 2.3–4.6)
Potassium: 4.2 mEq/L (ref 3.5–5.3)
Sodium: 140 mEq/L (ref 135–145)

## 2013-08-16 LAB — TSH: TSH: 0.756 u[IU]/mL (ref 0.350–4.500)

## 2013-08-16 LAB — HEMOGLOBIN A1C
Hgb A1c MFr Bld: 5.4 % (ref <5.7)
MEAN PLASMA GLUCOSE: 108 mg/dL (ref <117)

## 2013-08-16 MED ORDER — GABAPENTIN 300 MG PO CAPS: 300.0000 mg | ORAL_CAPSULE | Freq: Three times a day (TID) | ORAL | Status: DC | PRN

## 2013-08-16 MED ORDER — TRAMADOL HCL 50 MG PO TABS: 50.0000 mg | ORAL_TABLET | Freq: Four times a day (QID) | ORAL | Status: DC | PRN

## 2013-08-16 MED ORDER — ATENOLOL 25 MG PO TABS: 25.0000 mg | ORAL_TABLET | Freq: Every day | ORAL | Status: DC

## 2013-08-16 NOTE — Patient Instructions (Addendum)
Ice x 15 min twice a day apply aspercreme or salon pas  Basic Carbohydrate Counting Basic carbohydrate counting is a way to plan meals. It is done by counting the amount of carbohydrate in foods. Foods that have carbohydrates are starches (grains, beans, starchy vegetables) and sweets. Eating carbohydrates increases blood glucose (sugar) levels. People with diabetes use carbohydrate counting to help keep their blood glucose at a normal level.  COUNTING CARBOHYDRATES IN FOODS The first step in counting carbohydrates is to learn how many carbohydrate servings you should have in every meal. A dietitian can plan this for you. After learning the amount of carbohydrates to include in your meal plan, you can start to choose the carbohydrate-containing foods you want to eat.  There are 2 ways to identify the amount of carbohydrates in the foods you eat.  Read the Nutrition Facts panel on food labels. You need 2 pieces of information from the Nutrition Facts panel to count carbohydrates this way:  Serving size.  Total carbohydrate (in grams). Decide how many servings you will be eating. If it is 1 serving, you will be eating the amount of carbohydrate listed on the panel. If you will be eating 2 servings, you will be eating double the amount of carbohydrate listed on the panel.   Learn serving sizes. A serving size of most carbohydrate-containing foods is about 15 grams (g). Listed below are single serving sizes of common carbohydrate-containing foods:  1 slice bread.   cup unsweetened, dry cereal.   cup hot cereal.   cup rice.   cup mashed potatoes.   cup pasta.  1 cup fresh fruit.   cup canned fruit.  1 cup milk (whole, 2%, or skim).   cup starchy vegetables (peas, corn, or potatoes). Counting carbohydrates this way is similar to looking on the Nutrition Facts panel. Decide how many servings you will eat first. Multiply the number of servings you eat by 15 g. For example, if you  have 2 cups of strawberries, you had 2 servings. That means you had 30 g of carbohydrate (2 servings x 15 g = 30 g). CALCULATING CARBOHYDRATES IN A MEAL Sample dinner  3 oz chicken breast.   cup brown rice.   cup corn.  1 cup fat-free milk.  1 cup strawberries with sugar-free whipped topping. Carbohydrate calculation First, identify the foods that contain carbohydrate:  Rice.  Corn.  Milk.  Strawberries. Calculate the number of servings eaten:  2 servings rice.  1 serving corn.  1 serving milk.  1 serving strawberries. Multiply the number of servings by 15 g:  2 servings rice x 15 g = 30 g.  1 serving corn x 15 g = 15 g.  1 serving milk x 15 g = 15 g.  1 serving strawberries x 15 g = 15 g. Add the amounts to find the total carbohydrates eaten: 30 g + 15 g + 15 g + 15 g = 75 g carbohydrate eaten at dinner. Document Released: 07/21/2005 Document Revised: 10/13/2011 Document Reviewed: 06/06/2011 [facility] Patient Information 2014 [place], [place].

## 2013-08-16 NOTE — Progress Notes (Signed)
Pre visit review using our clinic review tool, if applicable. No additional management support is needed unless otherwise documented below in the visit note.

## 2013-08-17 ENCOUNTER — Telehealth: Payer: Self-pay | Admitting: Family Medicine

## 2013-08-17 ENCOUNTER — Encounter (INDEPENDENT_AMBULATORY_CARE_PROVIDER_SITE_OTHER): Payer: Medicare Other | Admitting: Ophthalmology

## 2013-08-17 NOTE — Telephone Encounter (Signed)
Relevant patient education assigned to patient using Emmi. ° °

## 2013-08-18 ENCOUNTER — Encounter (INDEPENDENT_AMBULATORY_CARE_PROVIDER_SITE_OTHER): Payer: Medicare Other | Admitting: Ophthalmology

## 2013-08-18 DIAGNOSIS — H43819 Vitreous degeneration, unspecified eye: Secondary | ICD-10-CM | POA: Diagnosis not present

## 2013-08-18 DIAGNOSIS — H348392 Tributary (branch) retinal vein occlusion, unspecified eye, stable: Secondary | ICD-10-CM

## 2013-08-18 DIAGNOSIS — I10 Essential (primary) hypertension: Secondary | ICD-10-CM | POA: Diagnosis not present

## 2013-08-18 DIAGNOSIS — H35039 Hypertensive retinopathy, unspecified eye: Secondary | ICD-10-CM

## 2013-08-18 DIAGNOSIS — H2700 Aphakia, unspecified eye: Secondary | ICD-10-CM

## 2013-08-19 ENCOUNTER — Telehealth: Payer: Self-pay

## 2013-08-19 DIAGNOSIS — D72829 Elevated white blood cell count, unspecified: Secondary | ICD-10-CM

## 2013-08-19 NOTE — Telephone Encounter (Signed)
Lab order placed

## 2013-08-21 NOTE — Assessment & Plan Note (Signed)
Borderline, encouraged DASH diet and no change in meds for now.

## 2013-08-21 NOTE — Assessment & Plan Note (Signed)
Tolerating 5 mg of Crestor but did not tolerate 10 mg had significant nausea. Void trans fats and continue current dosing. Consider switch to krill oil caps

## 2013-08-21 NOTE — Progress Notes (Signed)
Patient ID: Kimberly Irwin, female   DOB: 11/10/1945, 68 y.o.   MRN: [id number] Kimberly Irwin [id number] 1946-06-03 08/21/2013      Progress Note-Follow Up  Subjective  Chief Complaint  Chief Complaint  Patient presents with  . Follow-up    3 month    HPI  She and female who is in today for routine followup. She did not tolerate Crestor at 10 mg. It caused nausea but it 5 mg she is tolerating. No nausea. Has been struggling with some mild flexion pain with increased exercise but otherwise offers no acute complaints. Denies any recent illness, headache, chest pain, palpitations, shortness of breath, GI or GU concerns. Is taking medications as prescribed other than having to decrease the dose of her Crestor  Past Medical History  Diagnosis Date  . Sarcoidosis   . Hyperlipidemia   . GERD (gastroesophageal reflux disease)   . Hypertension   . Chronic low back pain   . DDD (degenerative disc disease)     L3-4 with facet arthropathy and stenosis  . Degenerative spondylolisthesis     L4-5 grade 1 with stenosis  . Acute hip pain 10/26/2012  . Bursitis of left hip 10/26/2012  . Pain in joint, lower leg 10/26/2012    left   . Tremors of nervous system     Past Surgical History  Procedure Laterality Date  . Carpal tunnel release    . Thyroidectomy    . Back surgery  01/15/09    L3-4 and L4-5 decompressive laminectomy with bilateral L3, L4, and L5 decompressive foraminotomies, more than it would be required for simple interbody fusion alone.  . Back surgery  01/15/09    L3-4 and L4-5 posterior lumbar interbody fusion utilizing tanget interbody allograft wedge, Telamon interbody PEEk cage, and local autografting.  . Back surgery  01/15/09    L3, L4, and L5 posterolateral arthrodesis using segmental pedicle screw fixation and local autografting.  . Joint replacement  09/13/09    Right Hip, Dr. Alvan Dame  . 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 Cancer Mother 82    Breast  . Heart disease Mother   . Hypertension Mother   . Hyperlipidemia Mother   . Heart attack Mother   . Asthma Maternal Grandmother     History   Social History  . Marital Status: Widowed    Spouse Name: N/A    Number of Children: 2  . Years of Education: N/A   Occupational History  . Not on file.   Social History Main Topics  . Smoking status: Former Smoker -- 0.50 packs/day for 32 years    Types: Cigarettes    Quit date: 08/04/1993  . Smokeless tobacco: Never Used  . Alcohol Use: No  . Drug Use: No  . Sexual Activity: Not on file   Other Topics Concern  . Not on file   Social History Narrative   Married 26 years and has 2 children (2 daughters)   Alcohol Use - no   Former Smoker - she quit 10 years ago, started when she was 41 and has had varying level of use of tobacco products from 1/2 pack to 3 packs per day.    Current Outpatient Prescriptions on File Prior to Visit  Medication  Sig Dispense Refill  . Calcium Carbonate-Vit D-Min (CALCIUM 1200 PO) Take 1 tablet by mouth daily.      . celecoxib (CELEBREX) 200 MG capsule Take 200 mg by mouth daily as needed.       . Cholecalciferol (VITAMIN D) 2000 UNITS tablet Take 2,000 Units by mouth daily.      . Cinnamon 500 MG capsule Take 2,000 mg by mouth daily.        . Coenzyme Q10 200 MG capsule Take 200 mg by mouth daily.      . Esomeprazole Magnesium (NEXIUM PO) Take by mouth daily.      Marland Kitchen levothyroxine (SYNTHROID, LEVOTHROID) 112 MCG tablet Take 112 mcg by mouth daily before breakfast.      . Omega-3 Fatty Acids (FISH OIL) 1200 MG CAPS Take 2 capsules by mouth daily.        . rosuvastatin (CRESTOR) 10 MG tablet Take 5 mg by mouth every evening.      . hydrochlorothiazide (HYDRODIURIL) 25 MG tablet Take 25 mg by mouth every morning.        No current facility-administered medications on file prior to visit.    Allergies  Allergen Reactions  . Lipitor [Atorvastatin]   . Metronidazole   . Penicillins   . Pregabalin     REACTION: Somnolence and dizziness  . Simvastatin     Leg pain    Review of Systems  Review of Systems  Constitutional: Negative for fever and malaise/fatigue.  HENT: Negative for congestion.   Eyes: Negative for discharge.  Respiratory: Negative for shortness of breath.   Cardiovascular: Negative for chest pain, palpitations and leg swelling.  Gastrointestinal: Negative for nausea, abdominal pain and diarrhea.  Genitourinary: Negative for dysuria.  Musculoskeletal: Positive for joint pain. Negative for falls.       Intermittent left shin pain  Skin: Negative for rash.  Neurological: Negative for loss of consciousness and headaches.  Endo/Heme/Allergies: Negative for polydipsia.  Psychiatric/Behavioral: Negative for depression and suicidal ideas. The patient is not nervous/anxious and does not have insomnia.     Objective  BP 138/90  Pulse 71  Temp(Src) 97.9 F (36.6 C) (Oral)  Ht _0  (1.626 m)  Wt 181 lb 1.3 oz (82.137 kg)  BMI 31.07 kg/m2  SpO2 94%  Physical Exam  Physical Exam  Constitutional: She is oriented to person, place, and time and well-developed, well-nourished, and in no distress. No distress.  HENT:  Head: Normocephalic and atraumatic.  Eyes: Conjunctivae are normal.  Neck: Neck supple. No thyromegaly present.  Cardiovascular: Normal rate, regular rhythm and normal heart sounds.   No murmur heard. Pulmonary/Chest: Effort normal and breath sounds normal. She has no wheezes.  Abdominal: She exhibits no distension and no mass.  Musculoskeletal: She exhibits no edema.  Lymphadenopathy:    She has no cervical adenopathy.  Neurological: She is alert and oriented to person, place, and time.  Skin: Skin is warm and dry. No rash noted. She is not diaphoretic.   Psychiatric: Memory, affect and judgment normal.    Lab Results  Component Value Date   TSH 0.756 08/16/2013   Lab Results  Component Value Date   WBC 12.5* 08/16/2013   HGB 16.0* 08/16/2013   HCT 45.5 08/16/2013   MCV 87.2 08/16/2013   PLT 251 08/16/2013   Lab Results  Component Value Date   CREATININE 0.87 08/16/2013   BUN 17 08/16/2013   NA 140 08/16/2013   K 4.2 08/16/2013   CL  99 08/16/2013   CO2 28 08/16/2013   Lab Results  Component Value Date   ALT 28 08/16/2013   AST 28 08/16/2013   ALKPHOS 66 08/16/2013   BILITOT 0.7 08/16/2013   Lab Results  Component Value Date   CHOL 189 08/16/2013   Lab Results  Component Value Date   HDL 45 08/16/2013   Lab Results  Component Value Date   LDLCALC 101* 08/16/2013   Lab Results  Component Value Date   TRIG 214* 08/16/2013   Lab Results  Component Value Date   CHOLHDL 4.2 08/16/2013     Assessment & Plan  HYPERTENSION, CONTROLLED Borderline, encouraged DASH diet and no change in meds for now.  HYPOTHYROIDISM tsh wnl on current dose of Levothyroxine. Continue same  HYPERLIPIDEMIA Tolerating 5 mg of Crestor but did not tolerate 10 mg had significant nausea. Void trans fats and continue current dosing. Consider switch to krill oil caps  Obese Encouraged DASH diet and regular exercise  Hyperglycemia hgba1c 5.4 minimize simple carbs

## 2013-08-21 NOTE — Assessment & Plan Note (Signed)
hgba1c 5.4 minimize simple carbs

## 2013-08-21 NOTE — Assessment & Plan Note (Signed)
Encouraged DASH diet and regular exercise

## 2013-08-21 NOTE — Assessment & Plan Note (Signed)
tsh wnl on current dose of Levothyroxine. Continue same

## 2013-08-24 DIAGNOSIS — M1610 Unilateral primary osteoarthritis, unspecified hip: Secondary | ICD-10-CM | POA: Diagnosis not present

## 2013-08-24 DIAGNOSIS — M169 Osteoarthritis of hip, unspecified: Secondary | ICD-10-CM | POA: Diagnosis not present

## 2013-08-24 DIAGNOSIS — Z96649 Presence of unspecified artificial hip joint: Secondary | ICD-10-CM | POA: Diagnosis not present

## 2013-09-14 ENCOUNTER — Other Ambulatory Visit: Payer: Self-pay | Admitting: Family Medicine

## 2013-09-16 ENCOUNTER — Other Ambulatory Visit: Payer: Self-pay | Admitting: Family Medicine

## 2013-09-22 DIAGNOSIS — M1610 Unilateral primary osteoarthritis, unspecified hip: Secondary | ICD-10-CM | POA: Diagnosis not present

## 2013-09-22 DIAGNOSIS — Z96649 Presence of unspecified artificial hip joint: Secondary | ICD-10-CM | POA: Diagnosis not present

## 2013-09-22 DIAGNOSIS — M169 Osteoarthritis of hip, unspecified: Secondary | ICD-10-CM | POA: Diagnosis not present

## 2013-09-23 ENCOUNTER — Encounter: Payer: Self-pay | Admitting: Family Medicine

## 2013-09-23 ENCOUNTER — Ambulatory Visit (INDEPENDENT_AMBULATORY_CARE_PROVIDER_SITE_OTHER): Payer: Medicare Other | Admitting: Family Medicine

## 2013-09-23 VITALS — BP 134/88 | HR 70 | Temp 98.3°F | Resp 16 | Ht 64.0 in | Wt 189.0 lb

## 2013-09-23 DIAGNOSIS — I10 Essential (primary) hypertension: Secondary | ICD-10-CM

## 2013-09-23 DIAGNOSIS — R1013 Epigastric pain: Secondary | ICD-10-CM | POA: Diagnosis not present

## 2013-09-23 DIAGNOSIS — R110 Nausea: Secondary | ICD-10-CM | POA: Insufficient documentation

## 2013-09-23 DIAGNOSIS — R7309 Other abnormal glucose: Secondary | ICD-10-CM

## 2013-09-23 DIAGNOSIS — D72829 Elevated white blood cell count, unspecified: Secondary | ICD-10-CM | POA: Insufficient documentation

## 2013-09-23 DIAGNOSIS — E785 Hyperlipidemia, unspecified: Secondary | ICD-10-CM

## 2013-09-23 DIAGNOSIS — E039 Hypothyroidism, unspecified: Secondary | ICD-10-CM

## 2013-09-23 DIAGNOSIS — R739 Hyperglycemia, unspecified: Secondary | ICD-10-CM

## 2013-09-23 LAB — CBC
HEMATOCRIT: 43.8 % (ref 36.0–46.0)
Hemoglobin: 15.6 g/dL — ABNORMAL HIGH (ref 12.0–15.0)
MCH: 30.6 pg (ref 26.0–34.0)
MCHC: 35.6 g/dL (ref 30.0–36.0)
MCV: 85.9 fL (ref 78.0–100.0)
Platelets: 211 10*3/uL (ref 150–400)
RBC: 5.1 MIL/uL (ref 3.87–5.11)
RDW: 13.6 % (ref 11.5–15.5)
WBC: 9.3 10*3/uL (ref 4.0–10.5)

## 2013-09-23 MED ORDER — PROMETHAZINE HCL 25 MG PO TABS: 25.0000 mg | ORAL_TABLET | Freq: Three times a day (TID) | ORAL | Status: DC | PRN

## 2013-09-23 NOTE — Progress Notes (Signed)
Patient ID: Kimberly Irwin, female   DOB: [birth year], 68 y.o.   MRN: [id number] Kimberly Irwin [id number] May 24, 1946 09/23/2013      Progress Note-Follow Up  Subjective  Chief Complaint  Chief Complaint  Patient presents with  . Follow-up    5-wk. [Hyperglycemia; HTN; Hyperlipid; Hypothyroid; Obesity]    HPI  Is a 68 year old Caucasian female who is in today for routine followup. She continues to struggle with daily nausea and she does not feel the injury was helpful. She is not taking any muscle relaxers or tramadol. No celebrex and has not started Meloxicam at this time. No recent illness patient, GI or GU concerns otherwise noted at this time. She continues to show with left hip pain status post surgery with Dr. Alvan Dame. It is slowly improving. Taking minimal medications  Past Medical History  Diagnosis Date  . Sarcoidosis   . Hyperlipidemia   . GERD (gastroesophageal reflux disease)   . Hypertension   . Chronic low back pain   . DDD (degenerative disc disease)     L3-4 with facet arthropathy and stenosis  . Degenerative spondylolisthesis     L4-5 grade 1 with stenosis  . Acute hip pain 10/26/2012  . Bursitis of left hip 10/26/2012  . Pain in joint, lower leg 10/26/2012    left   . Tremors of nervous system     Past Surgical History  Procedure Laterality Date  . Carpal tunnel release    . Thyroidectomy    . Back surgery  01/15/09    L3-4 and L4-5 decompressive laminectomy with bilateral L3, L4, and L5 decompressive foraminotomies, more than it would be required for simple interbody fusion alone.  . Back surgery  01/15/09    L3-4 and L4-5 posterior lumbar interbody fusion utilizing tanget interbody allograft wedge, Telamon interbody PEEk cage, and local autografting.  . Back surgery  01/15/09    L3, L4, and L5 posterolateral arthrodesis using segmental pedicle screw fixation and local autografting.  . Joint replacement  09/13/09    Right Hip, Dr. Alvan Dame  . Total hip arthroplasty Left  06/28/2013    Procedure: LEFT TOTAL  HIP ARTHROPLASTY ANTERIOR APPROACH;  Surgeon: Mauri Pole, MD;  Location: [facility];  Service: Orthopedics;  Laterality: Left;    Family History  Problem Relation Age of Onset  . Cancer Mother 19    Breast  . Heart disease Mother   . Hypertension Mother   . Hyperlipidemia Mother   . Heart attack Mother   . Asthma Maternal Grandmother     History   Social History  . Marital Status: Widowed    Spouse Name: N/A    Number of Children: 2  . Years of Education: N/A   Occupational History  . Not on file.   Social History Main Topics  . Smoking status: Former Smoker -- 0.50 packs/day for 32 years    Types: Cigarettes    Quit date: 08/04/1993  . Smokeless tobacco: Never Used  . Alcohol Use: No  . Drug Use: No  . Sexual Activity: Not on file   Other Topics Concern  . Not on file   Social History Narrative   Married 22 years and has 2 children (2 daughters)   Alcohol Use - no   Former Smoker - she quit 10 years ago, started when she was 59 and has had varying level of use of tobacco products from 1/2 pack to 3 packs per day.    Current Outpatient  Prescriptions on File Prior to Visit  Medication Sig Dispense Refill  . atenolol (TENORMIN) 25 MG tablet Take 1 tablet (25 mg total) by mouth daily.  30 tablet  3  . Calcium Carbonate-Vit D-Min (CALCIUM 1200 PO) Take 1 tablet by mouth daily.      . celecoxib (CELEBREX) 200 MG capsule Take 200 mg by mouth daily as needed.       . Cholecalciferol (VITAMIN D) 2000 UNITS tablet Take 2,000 Units by mouth daily.      . Cinnamon 500 MG capsule Take 2,000 mg by mouth daily.        . Coenzyme Q10 200 MG capsule Take 200 mg by mouth daily.      . Esomeprazole Magnesium (NEXIUM PO) Take by mouth daily.      Marland Kitchen gabapentin (NEURONTIN) 300 MG capsule Take 1 capsule (300 mg total) by mouth 3 (three) times daily as needed (pain).  90 capsule  5  . hydrochlorothiazide (HYDRODIURIL) 25 MG tablet Take 25 mg by  mouth every morning.      Marland Kitchen levothyroxine (SYNTHROID, LEVOTHROID) 112 MCG tablet Take 112 mcg by mouth daily before breakfast.      . Omega-3 Fatty Acids (FISH OIL) 1200 MG CAPS Take 2 capsules by mouth daily.        . rosuvastatin (CRESTOR) 10 MG tablet Take 5 mg by mouth every evening.      . traMADol (ULTRAM) 50 MG tablet Take 1 tablet (50 mg total) by mouth every 6 (six) hours as needed for moderate pain or severe pain.  60 tablet  0   No current facility-administered medications on file prior to visit.    Allergies  Allergen Reactions  . Lipitor [Atorvastatin]   . Metronidazole   . Penicillins   . Pregabalin     REACTION: Somnolence and dizziness  . Simvastatin     Leg pain    Review of Systems  Review of Systems  Constitutional: Negative for fever and malaise/fatigue.  HENT: Negative for congestion.   Eyes: Negative for discharge.  Respiratory: Negative for shortness of breath.   Cardiovascular: Negative for chest pain, palpitations and leg swelling.  Gastrointestinal: Negative for nausea, abdominal pain and diarrhea.  Genitourinary: Negative for dysuria.  Musculoskeletal: Negative for falls.  Skin: Negative for rash.  Neurological: Negative for loss of consciousness and headaches.  Endo/Heme/Allergies: Negative for polydipsia.  Psychiatric/Behavioral: Negative for depression and suicidal ideas. The patient is not nervous/anxious and does not have insomnia.     Objective  BP 134/88  Pulse 70  Temp(Src) 98.3 F (36.8 C) (Oral)  Resp 16  Ht _0  (1.626 m)  Wt 189 lb (85.73 kg)  BMI 32.43 kg/m2  SpO2 98%  Physical Exam  Physical Exam  Constitutional: She is oriented to person, place, and time and well-developed, well-nourished, and in no distress. No distress.  HENT:  Head: Normocephalic and atraumatic.  Eyes: Conjunctivae are normal.  Neck: Neck supple. No thyromegaly present.  Cardiovascular: Normal rate, regular rhythm and normal heart sounds.   No  murmur heard. Pulmonary/Chest: Effort normal and breath sounds normal. She has no wheezes.  Abdominal: She exhibits no distension and no mass.  Musculoskeletal: She exhibits no edema.  Lymphadenopathy:    She has no cervical adenopathy.  Neurological: She is alert and oriented to person, place, and time.  Skin: Skin is warm and dry. No rash noted. She is not diaphoretic.  Psychiatric: Memory, affect and judgment normal.  Lab Results  Component Value Date   TSH 0.756 08/16/2013   Lab Results  Component Value Date   WBC 12.5* 08/16/2013   HGB 16.0* 08/16/2013   HCT 45.5 08/16/2013   MCV 87.2 08/16/2013   PLT 251 08/16/2013   Lab Results  Component Value Date   CREATININE 0.87 08/16/2013   BUN 17 08/16/2013   NA 140 08/16/2013   K 4.2 08/16/2013   CL 99 08/16/2013   CO2 28 08/16/2013   Lab Results  Component Value Date   ALT 28 08/16/2013   AST 28 08/16/2013   ALKPHOS 66 08/16/2013   BILITOT 0.7 08/16/2013   Lab Results  Component Value Date   CHOL 189 08/16/2013   Lab Results  Component Value Date   HDL 45 08/16/2013   Lab Results  Component Value Date   LDLCALC 101* 08/16/2013   Lab Results  Component Value Date   TRIG 214* 08/16/2013   Lab Results  Component Value Date   CHOLHDL 4.2 08/16/2013     Assessment & Plan  HYPERTENSION, CONTROLLED Well controlled, no changes  HYPERLIPIDEMIA Tolerating Crestor at 5 mg did not tolerate at 10 mg continue this with CoQ10  HYPOTHYROIDISM Well treated on current dose of medication  Hyperglycemia Encouraged DASH diet and avoid simple carbs continue  To monitor  Leukocytosis, unspecified Mild repeat cbc today.  Nausea alone Patient questions whether this developed/worsened when insurance forced [place] to change her beta blocker. For now will stop cinnamon, fish oil, vitamin d and she is given Phenergan to use sparingly for bad episodes and she will call to switch beta blocker again if symptoms persist

## 2013-09-23 NOTE — Assessment & Plan Note (Signed)
Mild repeat cbc today.

## 2013-09-23 NOTE — Assessment & Plan Note (Signed)
Well treated on current dose of medication

## 2013-09-23 NOTE — Patient Instructions (Signed)
Stop Cinnamon, Vitamin D, fish oil for 2 weeks to see if that helps nausea Call if you want to try switching from Atenolol to Carvedilol or Metoprolol  Nausea, Adult Nausea is the feeling that you have an upset stomach or have to vomit. Nausea by itself is not likely a serious concern, but it may be an early sign of more serious medical problems. As nausea gets worse, it can lead to vomiting. If vomiting develops, there is the risk of dehydration.  CAUSES   Viral infections.  Food poisoning.  Medicines.  Pregnancy.  Motion sickness.  Migraine headaches.  Emotional distress.  Severe pain from any source.  Alcohol intoxication. HOME CARE INSTRUCTIONS  Get plenty of rest.  Ask your caregiver about specific rehydration instructions.  Eat small amounts of food and sip liquids more often.  Take all medicines as told by your caregiver. SEEK MEDICAL CARE IF:  You have not improved after 2 days, or you get worse.  You have a headache. SEEK IMMEDIATE MEDICAL CARE IF:   You have a fever.  You faint.  You keep vomiting or have blood in your vomit.  You are extremely weak or dehydrated.  You have dark or bloody stools.  You have severe chest or abdominal pain. MAKE SURE YOU:  Understand these instructions.  Will watch your condition.  Will get help right away if you are not doing well or get worse. Document Released: 08/28/2004 Document Revised: 04/14/2012 Document Reviewed: 04/02/2011 [facility] Patient Information 2014 [place], [place].

## 2013-09-23 NOTE — Assessment & Plan Note (Signed)
Tolerating Crestor at 5 mg did not tolerate at 10 mg continue this with CoQ10

## 2013-09-23 NOTE — Assessment & Plan Note (Signed)
Encouraged DASH diet and avoid simple carbs continue  To monitor

## 2013-09-23 NOTE — Assessment & Plan Note (Signed)
Patient questions whether this developed/worsened when insurance forced [place] to change her beta blocker. For now will stop cinnamon, fish oil, vitamin d and she is given Phenergan to use sparingly for bad episodes and she will call to switch beta blocker again if symptoms persist

## 2013-09-23 NOTE — Assessment & Plan Note (Signed)
Well controlled, no changes 

## 2013-09-23 NOTE — Progress Notes (Signed)
Pre visit review using our clinic review tool, if applicable. No additional management support is needed unless otherwise documented below in the visit note/SLS

## 2013-09-26 LAB — H. PYLORI ANTIBODY, IGG: H Pylori IgG: <0.4 {ISR}

## 2013-10-13 ENCOUNTER — Encounter (INDEPENDENT_AMBULATORY_CARE_PROVIDER_SITE_OTHER): Payer: Medicare Other | Admitting: Ophthalmology

## 2013-10-13 DIAGNOSIS — H43819 Vitreous degeneration, unspecified eye: Secondary | ICD-10-CM

## 2013-10-13 DIAGNOSIS — H348392 Tributary (branch) retinal vein occlusion, unspecified eye, stable: Secondary | ICD-10-CM

## 2013-10-13 DIAGNOSIS — I10 Essential (primary) hypertension: Secondary | ICD-10-CM

## 2013-10-13 DIAGNOSIS — H35039 Hypertensive retinopathy, unspecified eye: Secondary | ICD-10-CM | POA: Diagnosis not present

## 2013-12-05 ENCOUNTER — Telehealth: Payer: Self-pay | Admitting: Family Medicine

## 2013-12-05 DIAGNOSIS — R251 Tremor, unspecified: Secondary | ICD-10-CM

## 2013-12-05 DIAGNOSIS — I10 Essential (primary) hypertension: Secondary | ICD-10-CM

## 2013-12-05 MED ORDER — ATENOLOL 25 MG PO TABS: 25.0000 mg | ORAL_TABLET | Freq: Every day | ORAL | Status: DC

## 2013-12-05 NOTE — Telephone Encounter (Signed)
Refill-atenolol

## 2013-12-09 ENCOUNTER — Encounter: Payer: Self-pay | Admitting: Family Medicine

## 2013-12-09 ENCOUNTER — Ambulatory Visit (INDEPENDENT_AMBULATORY_CARE_PROVIDER_SITE_OTHER): Payer: Medicare Other | Admitting: Family Medicine

## 2013-12-09 VITALS — BP 112/80 | HR 64 | Temp 98.5°F | Ht 64.0 in | Wt 188.0 lb

## 2013-12-09 DIAGNOSIS — R52 Pain, unspecified: Secondary | ICD-10-CM | POA: Diagnosis not present

## 2013-12-09 DIAGNOSIS — E039 Hypothyroidism, unspecified: Secondary | ICD-10-CM

## 2013-12-09 DIAGNOSIS — R7309 Other abnormal glucose: Secondary | ICD-10-CM

## 2013-12-09 DIAGNOSIS — I10 Essential (primary) hypertension: Secondary | ICD-10-CM | POA: Diagnosis not present

## 2013-12-09 DIAGNOSIS — E785 Hyperlipidemia, unspecified: Secondary | ICD-10-CM

## 2013-12-09 DIAGNOSIS — R739 Hyperglycemia, unspecified: Secondary | ICD-10-CM

## 2013-12-09 DIAGNOSIS — K219 Gastro-esophageal reflux disease without esophagitis: Secondary | ICD-10-CM

## 2013-12-09 DIAGNOSIS — D72829 Elevated white blood cell count, unspecified: Secondary | ICD-10-CM

## 2013-12-09 DIAGNOSIS — E669 Obesity, unspecified: Secondary | ICD-10-CM

## 2013-12-09 DIAGNOSIS — G894 Chronic pain syndrome: Secondary | ICD-10-CM

## 2013-12-09 LAB — RENAL FUNCTION PANEL
ALBUMIN: 4.3 g/dL (ref 3.5–5.2)
BUN: 17 mg/dL (ref 6–23)
CALCIUM: 9.8 mg/dL (ref 8.4–10.5)
CO2: 30 mEq/L (ref 19–32)
Chloride: 101 mEq/L (ref 96–112)
Creat: 0.91 mg/dL (ref 0.50–1.10)
GLUCOSE: 90 mg/dL (ref 70–99)
POTASSIUM: 4.8 meq/L (ref 3.5–5.3)
Phosphorus: 3.2 mg/dL (ref 2.3–4.6)
Sodium: 140 mEq/L (ref 135–145)

## 2013-12-09 LAB — HEPATIC FUNCTION PANEL
ALBUMIN: 4.3 g/dL (ref 3.5–5.2)
ALT: 35 U/L (ref 0–35)
AST: 40 U/L — AB (ref 0–37)
Alkaline Phosphatase: 60 U/L (ref 39–117)
Bilirubin, Direct: 0.1 mg/dL (ref 0.0–0.3)
Indirect Bilirubin: 0.5 mg/dL (ref 0.2–1.2)
Total Bilirubin: 0.6 mg/dL (ref 0.2–1.2)
Total Protein: 7.3 g/dL (ref 6.0–8.3)

## 2013-12-09 LAB — CBC
HEMATOCRIT: 43.3 % (ref 36.0–46.0)
Hemoglobin: 15.3 g/dL — ABNORMAL HIGH (ref 12.0–15.0)
MCH: 30.6 pg (ref 26.0–34.0)
MCHC: 35.3 g/dL (ref 30.0–36.0)
MCV: 86.6 fL (ref 78.0–100.0)
Platelets: 210 10*3/uL (ref 150–400)
RBC: 5 MIL/uL (ref 3.87–5.11)
RDW: 13.8 % (ref 11.5–15.5)
WBC: 9.3 10*3/uL (ref 4.0–10.5)

## 2013-12-09 LAB — LIPID PANEL
CHOL/HDL RATIO: 5 ratio
CHOLESTEROL: 198 mg/dL (ref 0–200)
HDL: 40 mg/dL (ref >39)
LDL CALC: 114 mg/dL — AB (ref 0–99)
TRIGLYCERIDES: 222 mg/dL — AB (ref <150)
VLDL: 44 mg/dL — AB (ref 0–40)

## 2013-12-09 LAB — TSH: TSH: 0.984 u[IU]/mL (ref 0.350–4.500)

## 2013-12-09 MED ORDER — TRAMADOL HCL 50 MG PO TABS: 100.0000 mg | ORAL_TABLET | Freq: Four times a day (QID) | ORAL | Status: DC | PRN

## 2013-12-09 MED ORDER — LEVOTHYROXINE SODIUM 112 MCG PO TABS: 112.0000 ug | ORAL_TABLET | Freq: Every day | ORAL | Status: DC

## 2013-12-09 NOTE — Progress Notes (Signed)
Patient ID: Kimberly Irwin, female   DOB: 1945/10/24, 68 y.o.   MRN: [id number] Kimberly Irwin [id number] 1946-06-07 12/09/2013      Progress Note-Follow Up  Subjective  Chief Complaint  Chief Complaint  Patient presents with  . Follow-up    2 month    HPI  Patient is a 68 year old female in today for routine medical care. She is in today for followup. She was struggling with increased indigestion and began to take vinegar, honey and grapefruit and has found her symptoms improving. Presently no dyspepsia. Notes ongoing chronic pain in her left hip as well as her back and HA occasionally uses meloxicam and tramadol with good results. No other acute complaints. No recent illness. Denies CP/palp/SOB/HA/congestion/fevers/GI or GU c/o. Taking meds as prescribed  Past Medical History  Diagnosis Date  . Sarcoidosis   . Hyperlipidemia   . GERD (gastroesophageal reflux disease)   . Hypertension   . Chronic low back pain   . DDD (degenerative disc disease)     L3-4 with facet arthropathy and stenosis  . Degenerative spondylolisthesis     L4-5 grade 1 with stenosis  . Acute hip pain 10/26/2012  . Bursitis of left hip 10/26/2012  . Pain in joint, lower leg 10/26/2012    left   . Tremors of nervous system     Past Surgical History  Procedure Laterality Date  . Carpal tunnel release    . Thyroidectomy    . Back surgery  01/15/09    L3-4 and L4-5 decompressive laminectomy with bilateral L3, L4, and L5 decompressive foraminotomies, more than it would be required for simple interbody fusion alone.  . Back surgery  01/15/09    L3-4 and L4-5 posterior lumbar interbody fusion utilizing tanget interbody allograft wedge, Telamon interbody PEEk cage, and local autografting.  . Back surgery  01/15/09    L3, L4, and L5 posterolateral arthrodesis using segmental pedicle screw fixation and local autografting.  . Joint replacement  09/13/09    Right Hip, Dr. Alvan Dame  . 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 Cancer Mother 51    Breast  . Heart disease Mother   . Hypertension Mother   . Hyperlipidemia Mother   . Heart attack Mother   . Asthma Maternal Grandmother     History   Social History  . Marital Status: Widowed    Spouse Name: N/A    Number of Children: 2  . Years of Education: N/A   Occupational History  . Not on file.   Social History Main Topics  . Smoking status: Former Smoker -- 0.50 packs/day for 32 years    Types: Cigarettes    Quit date: 08/04/1993  . Smokeless tobacco: Never Used  . Alcohol Use: No  . Drug Use: No  . Sexual Activity: Not on file   Other Topics Concern  . Not on file   Social History Narrative   Married 1 years and has 2 children (2 daughters)   Alcohol Use - no   Former Smoker - she quit 10 years ago, started when she was 65 and has had varying level of use of tobacco products from 1/2 pack to 3 packs per day.    Current Outpatient Prescriptions on File Prior to Visit  Medication Sig Dispense Refill  . atenolol (TENORMIN) 25 MG tablet Take 1 tablet (25 mg total) by mouth daily.  30 tablet  3  . Calcium Carbonate-Vit D-Min (CALCIUM 1200 PO) Take 1 tablet by mouth daily.      . celecoxib (CELEBREX) 200 MG capsule Take 200 mg by mouth daily as needed.       . Cholecalciferol (VITAMIN D) 2000 UNITS tablet Take 2,000 Units by mouth daily.      . Cinnamon 500 MG capsule Take 2,000 mg by mouth daily.        . Coenzyme Q10 200 MG capsule Take 200 mg by mouth daily.      . Esomeprazole Magnesium (NEXIUM PO) Take by mouth daily.      Marland Kitchen gabapentin (NEURONTIN) 300 MG capsule Take 1 capsule (300 mg total) by mouth 3 (three) times daily as needed (pain).  90 capsule  5  . hydrochlorothiazide (HYDRODIURIL) 25 MG tablet Take 25 mg by mouth every  morning.      Marland Kitchen levothyroxine (SYNTHROID, LEVOTHROID) 112 MCG tablet Take 112 mcg by mouth daily before breakfast.      . meloxicam (MOBIC) 15 MG tablet Take 15 mg by mouth daily. Start Date: 02.22.15      . Omega-3 Fatty Acids (FISH OIL) 1200 MG CAPS Take 2 capsules by mouth daily.        . promethazine (PHENERGAN) 25 MG tablet Take 1 tablet (25 mg total) by mouth every 8 (eight) hours as needed for nausea or vomiting.  20 tablet  0  . rosuvastatin (CRESTOR) 10 MG tablet Take 5 mg by mouth every evening.      . traMADol (ULTRAM) 50 MG tablet Take 1 tablet (50 mg total) by mouth every 6 (six) hours as needed for moderate pain or severe pain.  60 tablet  0   No current facility-administered medications on file prior to visit.    Allergies  Allergen Reactions  . Lipitor [Atorvastatin]   . Metronidazole   . Penicillins   . Pregabalin     REACTION: Somnolence and dizziness  . Simvastatin     Leg pain    Review of Systems  Review of Systems  Constitutional: Negative for fever and malaise/fatigue.  HENT: Negative for congestion.   Eyes: Negative for discharge.  Respiratory: Negative for shortness of breath.   Cardiovascular: Negative for chest pain, palpitations and leg swelling.  Gastrointestinal: Positive for heartburn. Negative for nausea, vomiting, abdominal pain and diarrhea.  Genitourinary: Negative for dysuria.  Musculoskeletal: Positive for back pain and joint pain. Negative for falls.  Skin: Negative for rash.  Neurological: Negative for loss of consciousness and headaches.  Endo/Heme/Allergies: Negative for polydipsia.  Psychiatric/Behavioral: Negative for depression and suicidal ideas. The patient is not nervous/anxious and does not have insomnia.     Objective  BP 112/80  Pulse 64  Temp(Src) 98.5 F (36.9 C) (Oral)  Ht _0  (1.626 m)  Wt 188 lb (85.276 kg)  BMI 32.25 kg/m2  SpO2 93%  Physical Exam  Physical Exam  Constitutional: She is oriented to person,  place, and time and well-developed, well-nourished, and in no distress. No distress.  HENT:  Head: Normocephalic and atraumatic.  Eyes: Conjunctivae are normal.  Neck: Neck supple. No thyromegaly present.  Cardiovascular: Normal rate, regular rhythm and normal heart sounds.   No murmur heard. Pulmonary/Chest: Effort normal and breath sounds normal. She has no wheezes.  Abdominal: She exhibits no distension and no mass.  Musculoskeletal: She exhibits no edema.  Lymphadenopathy:    She has no cervical adenopathy.  Neurological: She is alert and oriented to person, place, and time.  Skin: Skin is warm and dry. No rash noted. She is not diaphoretic.  Psychiatric: Memory, affect and judgment normal.    Lab Results  Component Value Date   TSH 0.756 08/16/2013   Lab Results  Component Value Date   WBC 9.3 09/23/2013   HGB 15.6* 09/23/2013   HCT 43.8 09/23/2013   MCV 85.9 09/23/2013   PLT 211 09/23/2013   Lab Results  Component Value Date   CREATININE 0.87 08/16/2013   BUN 17 08/16/2013   NA 140 08/16/2013   K 4.2 08/16/2013   CL 99 08/16/2013   CO2 28 08/16/2013   Lab Results  Component Value Date   ALT 28 08/16/2013   AST 28 08/16/2013   ALKPHOS 66 08/16/2013   BILITOT 0.7 08/16/2013   Lab Results  Component Value Date   CHOL 189 08/16/2013   Lab Results  Component Value Date   HDL 45 08/16/2013   Lab Results  Component Value Date   LDLCALC 101* 08/16/2013   Lab Results  Component Value Date   TRIG 214* 08/16/2013   Lab Results  Component Value Date   CHOLHDL 4.2 08/16/2013     Assessment & Plan   GERD Avoid offending foods, start probiotics. Do not eat large meals in late evening and consider raising head of bed. Has been using vinegar/honey/grapefruit juice with good results  HYPERTENSION, CONTROLLED Well controlled, no changes to meds. Encouraged heart healthy diet such as the DASH diet and exercise as tolerated.   HYPOTHYROIDISM On Levothyroxine, continue to  monitor  Leukocytosis, unspecified resolved  Obese Encouraged DASH diet, decrease po intake and increase exercise as tolerated. Needs 7-8 hours of sleep nightly. Avoid trans fats, eat small, frequent meals every 4-5 hours with lean proteins, complex carbs and healthy fats. Minimize simple carbs, GMO foods.  Chronic pain syndrome May continue Meloxicam and Tramadol prn

## 2013-12-09 NOTE — Progress Notes (Signed)
Pre visit review using our clinic review tool, if applicable. No additional management support is needed unless otherwise documented below in the visit note.

## 2013-12-09 NOTE — Patient Instructions (Signed)
Back Pain, Adult °Low back pain is very common. About 1 in 5 people have back pain. The cause of low back pain is rarely dangerous. The pain often gets better over time. About half of people with a sudden onset of back pain feel better in just 2 weeks. About 8 in 10 people feel better by 6 weeks.  °CAUSES °Some common causes of back pain include: °· Strain of the muscles or ligaments supporting the spine. °· Wear and tear (degeneration) of the spinal discs. °· Arthritis. °· Direct injury to the back. °DIAGNOSIS °Most of the time, the direct cause of low back pain is not known. However, back pain can be treated effectively even when the exact cause of the pain is unknown. Answering your caregiver's questions about your overall health and symptoms is one of the most accurate ways to make sure the cause of your pain is not dangerous. If your caregiver needs more information, he or she may order lab work or imaging tests (X-rays or MRIs). However, even if imaging tests show changes in your back, this usually does not require surgery. °HOME CARE INSTRUCTIONS °For many people, back pain returns. Since low back pain is rarely dangerous, it is often a condition that people can learn to manage on their own.  °· Remain active. It is stressful on the back to sit or stand in one place. Do not sit, drive, or stand in one place for more than 30 minutes at a time. Take short walks on level surfaces as soon as pain allows. Try to increase the length of time you walk each day. °· Do not stay in bed. Resting more than 1 or 2 days can delay your recovery. °· Do not avoid exercise or work. Your body is made to move. It is not dangerous to be active, even though your back may hurt. Your back will likely heal faster if you return to being active before your pain is gone. °· Pay attention to your body when you  bend and lift. Many people have less discomfort when lifting if they bend their knees, keep the load close to their bodies, and  avoid twisting. Often, the most comfortable positions are those that put less stress on your recovering back. °· Find a comfortable position to sleep. Use a firm mattress and lie on your side with your knees slightly bent. If you lie on your back, put a pillow under your knees. °· Only take over-the-counter or prescription medicines as directed by your caregiver. Over-the-counter medicines to reduce pain and inflammation are often the most helpful. Your caregiver may prescribe muscle relaxant drugs. These medicines help dull your pain so you can more quickly return to your normal activities and healthy exercise. °· Put ice on the injured area. °· Put ice in a plastic bag. °· Place a towel between your skin and the bag. °· Leave the ice on for 15-20 minutes, 03-04 times a day for the first 2 to 3 days. After that, ice and heat may be alternated to reduce pain and spasms. °· Ask your caregiver about trying back exercises and gentle massage. This may be of some benefit. °· Avoid feeling anxious or stressed. Stress increases muscle tension and can worsen back pain. It is important to recognize when you are anxious or stressed and learn ways to manage it. Exercise is a great option. °SEEK MEDICAL CARE IF: °· You have pain that is not relieved with rest or medicine. °· You have pain that does not improve in 1 week. °· You have new symptoms. °· You are generally not feeling well. °SEEK   IMMEDIATE MEDICAL CARE IF:   You have pain that radiates from your back into your legs.  You develop new bowel or bladder control problems.  You have unusual weakness or numbness in your arms or legs.  You develop nausea or vomiting.  You develop abdominal pain.  You feel faint. Document Released: 07/21/2005 Document Revised: 01/20/2012 Document Reviewed: 12/09/2010 [facility] Patient Information 2014 [place], [place].

## 2013-12-14 NOTE — Assessment & Plan Note (Signed)
resolved 

## 2013-12-14 NOTE — Assessment & Plan Note (Signed)
On Levothyroxine, continue to monitor 

## 2013-12-14 NOTE — Assessment & Plan Note (Signed)
Well controlled, no changes to meds. Encouraged heart healthy diet such as the DASH diet and exercise as tolerated.

## 2013-12-14 NOTE — Assessment & Plan Note (Signed)
Avoid offending foods, start probiotics. Do not eat large meals in late evening and consider raising head of bed. Has been using vinegar/honey/grapefruit juice with good results

## 2013-12-14 NOTE — Assessment & Plan Note (Signed)
Encouraged DASH diet, decrease po intake and increase exercise as tolerated. Needs 7-8 hours of sleep nightly. Avoid trans fats, eat small, frequent meals every 4-5 hours with lean proteins, complex carbs and healthy fats. Minimize simple carbs, GMO foods. 

## 2013-12-14 NOTE — Assessment & Plan Note (Signed)
May continue Meloxicam and Tramadol prn

## 2014-01-14 ENCOUNTER — Other Ambulatory Visit: Payer: Self-pay | Admitting: Family Medicine

## 2014-01-16 ENCOUNTER — Telehealth: Payer: Self-pay | Admitting: Family Medicine

## 2014-01-16 MED ORDER — ROSUVASTATIN CALCIUM 10 MG PO TABS: 5.0000 mg | ORAL_TABLET | Freq: Every evening | ORAL | Status: DC

## 2014-01-16 NOTE — Telephone Encounter (Signed)
Refill sent.

## 2014-01-16 NOTE — Telephone Encounter (Signed)
Refill- crestor  [facility] [phone]

## 2014-01-25 ENCOUNTER — Encounter (INDEPENDENT_AMBULATORY_CARE_PROVIDER_SITE_OTHER): Payer: Medicare Other | Admitting: Ophthalmology

## 2014-01-25 DIAGNOSIS — H35039 Hypertensive retinopathy, unspecified eye: Secondary | ICD-10-CM | POA: Diagnosis not present

## 2014-01-25 DIAGNOSIS — H43819 Vitreous degeneration, unspecified eye: Secondary | ICD-10-CM | POA: Diagnosis not present

## 2014-01-25 DIAGNOSIS — I10 Essential (primary) hypertension: Secondary | ICD-10-CM | POA: Diagnosis not present

## 2014-01-25 DIAGNOSIS — H348392 Tributary (branch) retinal vein occlusion, unspecified eye, stable: Secondary | ICD-10-CM

## 2014-02-20 ENCOUNTER — Telehealth: Payer: Self-pay

## 2014-02-20 MED ORDER — ROSUVASTATIN CALCIUM 5 MG PO TABS: 5.0000 mg | ORAL_TABLET | Freq: Every day | ORAL | Status: DC

## 2014-02-20 NOTE — Telephone Encounter (Signed)
Paperwork states patient is requesting 5 mg tabs of crestor. Pt usually gets 10 mg and takes half.  Sent new rx

## 2014-02-21 ENCOUNTER — Encounter (INDEPENDENT_AMBULATORY_CARE_PROVIDER_SITE_OTHER): Payer: Medicare Other | Admitting: Ophthalmology

## 2014-02-21 DIAGNOSIS — H35039 Hypertensive retinopathy, unspecified eye: Secondary | ICD-10-CM | POA: Diagnosis not present

## 2014-02-21 DIAGNOSIS — H43819 Vitreous degeneration, unspecified eye: Secondary | ICD-10-CM

## 2014-02-21 DIAGNOSIS — I10 Essential (primary) hypertension: Secondary | ICD-10-CM

## 2014-02-21 DIAGNOSIS — H348392 Tributary (branch) retinal vein occlusion, unspecified eye, stable: Secondary | ICD-10-CM

## 2014-02-23 ENCOUNTER — Other Ambulatory Visit: Payer: Self-pay | Admitting: Dermatology

## 2014-02-23 DIAGNOSIS — D485 Neoplasm of uncertain behavior of skin: Secondary | ICD-10-CM | POA: Diagnosis not present

## 2014-02-23 DIAGNOSIS — L820 Inflamed seborrheic keratosis: Secondary | ICD-10-CM | POA: Diagnosis not present

## 2014-02-23 DIAGNOSIS — L219 Seborrheic dermatitis, unspecified: Secondary | ICD-10-CM | POA: Diagnosis not present

## 2014-02-23 DIAGNOSIS — L821 Other seborrheic keratosis: Secondary | ICD-10-CM | POA: Diagnosis not present

## 2014-03-13 ENCOUNTER — Other Ambulatory Visit: Payer: Self-pay | Admitting: Family Medicine

## 2014-04-03 ENCOUNTER — Encounter (INDEPENDENT_AMBULATORY_CARE_PROVIDER_SITE_OTHER): Payer: Medicare Other | Admitting: Ophthalmology

## 2014-04-03 DIAGNOSIS — H35039 Hypertensive retinopathy, unspecified eye: Secondary | ICD-10-CM

## 2014-04-03 DIAGNOSIS — H43819 Vitreous degeneration, unspecified eye: Secondary | ICD-10-CM

## 2014-04-03 DIAGNOSIS — H348392 Tributary (branch) retinal vein occlusion, unspecified eye, stable: Secondary | ICD-10-CM | POA: Diagnosis not present

## 2014-04-03 DIAGNOSIS — I10 Essential (primary) hypertension: Secondary | ICD-10-CM

## 2014-04-04 ENCOUNTER — Encounter (INDEPENDENT_AMBULATORY_CARE_PROVIDER_SITE_OTHER): Payer: Medicare Other | Admitting: Ophthalmology

## 2014-04-13 ENCOUNTER — Other Ambulatory Visit: Payer: Self-pay | Admitting: Family Medicine

## 2014-04-20 ENCOUNTER — Other Ambulatory Visit (INDEPENDENT_AMBULATORY_CARE_PROVIDER_SITE_OTHER): Payer: Medicare Other

## 2014-04-20 DIAGNOSIS — E039 Hypothyroidism, unspecified: Secondary | ICD-10-CM

## 2014-04-20 DIAGNOSIS — Z0000 Encounter for general adult medical examination without abnormal findings: Secondary | ICD-10-CM | POA: Diagnosis not present

## 2014-04-20 DIAGNOSIS — I10 Essential (primary) hypertension: Secondary | ICD-10-CM | POA: Diagnosis not present

## 2014-04-20 DIAGNOSIS — E785 Hyperlipidemia, unspecified: Secondary | ICD-10-CM | POA: Diagnosis not present

## 2014-04-21 LAB — LIPID PANEL
CHOL/HDL RATIO: 6
Cholesterol: 200 mg/dL (ref 0–200)
HDL: 35.3 mg/dL — ABNORMAL LOW (ref >39.00)
LDL CALC: 128 mg/dL — AB (ref 0–99)
NONHDL: 164.7
Triglycerides: 184 mg/dL — ABNORMAL HIGH (ref 0.0–149.0)
VLDL: 36.8 mg/dL (ref 0.0–40.0)

## 2014-04-21 LAB — RENAL FUNCTION PANEL
ALBUMIN: 4 g/dL (ref 3.5–5.2)
BUN: 15 mg/dL (ref 6–23)
CO2: 26 mEq/L (ref 19–32)
Calcium: 9.9 mg/dL (ref 8.4–10.5)
Chloride: 101 mEq/L (ref 96–112)
Creatinine, Ser: 1 mg/dL (ref 0.4–1.2)
GFR: 56.3 mL/min — ABNORMAL LOW (ref >60.00)
GLUCOSE: 126 mg/dL — AB (ref 70–99)
PHOSPHORUS: 3.4 mg/dL (ref 2.3–4.6)
POTASSIUM: 4.5 meq/L (ref 3.5–5.1)
Sodium: 138 mEq/L (ref 135–145)

## 2014-04-21 LAB — HEPATIC FUNCTION PANEL
ALT: 38 U/L — ABNORMAL HIGH (ref 0–35)
AST: 43 U/L — ABNORMAL HIGH (ref 0–37)
Albumin: 4 g/dL (ref 3.5–5.2)
Alkaline Phosphatase: 52 U/L (ref 39–117)
Bilirubin, Direct: 0.1 mg/dL (ref 0.0–0.3)
Total Bilirubin: 0.5 mg/dL (ref 0.2–1.2)
Total Protein: 7.9 g/dL (ref 6.0–8.3)

## 2014-04-21 LAB — CBC WITH DIFFERENTIAL/PLATELET
Basophils Absolute: 0 10*3/uL (ref 0.0–0.1)
Basophils Relative: 0.3 % (ref 0.0–3.0)
EOS ABS: 0.3 10*3/uL (ref 0.0–0.7)
Eosinophils Relative: 3.3 % (ref 0.0–5.0)
HCT: 44.5 % (ref 36.0–46.0)
Hemoglobin: 14.7 g/dL (ref 12.0–15.0)
Lymphocytes Relative: 22.1 % (ref 12.0–46.0)
Lymphs Abs: 1.9 10*3/uL (ref 0.7–4.0)
MCHC: 33 g/dL (ref 30.0–36.0)
MCV: 91.5 fl (ref 78.0–100.0)
Monocytes Absolute: 0.6 10*3/uL (ref 0.1–1.0)
Monocytes Relative: 6.7 % (ref 3.0–12.0)
NEUTROS PCT: 67.6 % (ref 43.0–77.0)
Neutro Abs: 5.9 10*3/uL (ref 1.4–7.7)
Platelets: 204 10*3/uL (ref 150.0–400.0)
RBC: 4.87 Mil/uL (ref 3.87–5.11)
RDW: 13.8 % (ref 11.5–15.5)
WBC: 8.8 10*3/uL (ref 4.0–10.5)

## 2014-04-21 LAB — TSH: TSH: 0.31 u[IU]/mL — ABNORMAL LOW (ref 0.35–4.50)

## 2014-04-22 ENCOUNTER — Emergency Department (HOSPITAL_BASED_OUTPATIENT_CLINIC_OR_DEPARTMENT_OTHER)
Admission: EM | Admit: 2014-04-22 | Discharge: 2014-04-22 | Disposition: A | Discharge status: Home / Self Care | Payer: Medicare Other | Attending: Emergency Medicine | Admitting: Emergency Medicine

## 2014-04-22 ENCOUNTER — Encounter (HOSPITAL_BASED_OUTPATIENT_CLINIC_OR_DEPARTMENT_OTHER): Payer: Self-pay | Admitting: Emergency Medicine

## 2014-04-22 DIAGNOSIS — K219 Gastro-esophageal reflux disease without esophagitis: Secondary | ICD-10-CM | POA: Insufficient documentation

## 2014-04-22 DIAGNOSIS — R6883 Chills (without fever): Secondary | ICD-10-CM | POA: Insufficient documentation

## 2014-04-22 DIAGNOSIS — Z791 Long term (current) use of non-steroidal anti-inflammatories (NSAID): Secondary | ICD-10-CM | POA: Diagnosis not present

## 2014-04-22 DIAGNOSIS — Z880 Allergy status to penicillin: Secondary | ICD-10-CM | POA: Diagnosis not present

## 2014-04-22 DIAGNOSIS — Z87891 Personal history of nicotine dependence: Secondary | ICD-10-CM | POA: Insufficient documentation

## 2014-04-22 DIAGNOSIS — E785 Hyperlipidemia, unspecified: Secondary | ICD-10-CM | POA: Diagnosis not present

## 2014-04-22 DIAGNOSIS — G8929 Other chronic pain: Secondary | ICD-10-CM | POA: Insufficient documentation

## 2014-04-22 DIAGNOSIS — Z9889 Other specified postprocedural states: Secondary | ICD-10-CM | POA: Diagnosis not present

## 2014-04-22 DIAGNOSIS — I10 Essential (primary) hypertension: Secondary | ICD-10-CM | POA: Insufficient documentation

## 2014-04-22 DIAGNOSIS — R259 Unspecified abnormal involuntary movements: Secondary | ICD-10-CM | POA: Insufficient documentation

## 2014-04-22 DIAGNOSIS — Z8739 Personal history of other diseases of the musculoskeletal system and connective tissue: Secondary | ICD-10-CM | POA: Insufficient documentation

## 2014-04-22 DIAGNOSIS — M545 Low back pain, unspecified: Secondary | ICD-10-CM

## 2014-04-22 DIAGNOSIS — Z8619 Personal history of other infectious and parasitic diseases: Secondary | ICD-10-CM | POA: Diagnosis not present

## 2014-04-22 DIAGNOSIS — Z79899 Other long term (current) drug therapy: Secondary | ICD-10-CM | POA: Diagnosis not present

## 2014-04-22 LAB — URINALYSIS, ROUTINE W REFLEX MICROSCOPIC
Bilirubin Urine: NEGATIVE
Glucose, UA: NEGATIVE mg/dL
Hgb urine dipstick: NEGATIVE
KETONES UR: NEGATIVE mg/dL
Nitrite: NEGATIVE
PH: 6 (ref 5.0–8.0)
Protein, ur: NEGATIVE mg/dL
Specific Gravity, Urine: 1.012 (ref 1.005–1.030)
Urobilinogen, UA: 0.2 mg/dL (ref 0.0–1.0)

## 2014-04-22 LAB — URINE MICROSCOPIC-ADD ON

## 2014-04-22 MED ORDER — MORPHINE SULFATE 4 MG/ML IJ SOLN
6.0000 mg | Freq: Once | INTRAMUSCULAR | Status: AC
  Administered 2014-04-22: 6 mg via INTRAMUSCULAR
  Filled 2014-04-22: qty 2

## 2014-04-22 MED ORDER — OXYCODONE-ACETAMINOPHEN 5-325 MG PO TABS: 1.0000 | ORAL_TABLET | Freq: Four times a day (QID) | ORAL | Status: DC | PRN

## 2014-04-22 NOTE — ED Notes (Signed)
Pt reports lower back pain that started yesterday. Pt denies incontinence. Reports history of lower back pain with surgery in the past. Denies known injury.

## 2014-04-22 NOTE — Discharge Instructions (Signed)
Back Pain, Adult °Low back pain is very common. About 1 in 5 people have back pain. The cause of low back pain is rarely dangerous. The pain often gets better over time. About half of people with a sudden onset of back pain feel better in just 2 weeks. About 8 in 10 people feel better by 6 weeks.  °CAUSES °Some common causes of back pain include: °· Strain of the muscles or ligaments supporting the spine. °· Wear and tear (degeneration) of the spinal discs. °· Arthritis. °· Direct injury to the back. °DIAGNOSIS °Most of the time, the direct cause of low back pain is not known. However, back pain can be treated effectively even when the exact cause of the pain is unknown. Answering your caregiver's questions about your overall health and symptoms is one of the most accurate ways to make sure the cause of your pain is not dangerous. If your caregiver needs more information, he or she may order lab work or imaging tests (X-rays or MRIs). However, even if imaging tests show changes in your back, this usually does not require surgery. °HOME CARE INSTRUCTIONS °For many people, back pain returns. Since low back pain is rarely dangerous, it is often a condition that people can learn to manage on their own.  °· Remain active. It is stressful on the back to sit or stand in one place. Do not sit, drive, or stand in one place for more than 30 minutes at a time. Take short walks on level surfaces as soon as pain allows. Try to increase the length of time you walk each day. °· Do not stay in bed. Resting more than 1 or 2 days can delay your recovery. °· Do not avoid exercise or work. Your body is made to move. It is not dangerous to be active, even though your back may hurt. Your back will likely heal faster if you return to being active before your pain is gone. °· Pay attention to your body when you  bend and lift. Many people have less discomfort when lifting if they bend their knees, keep the load close to their bodies, and  avoid twisting. Often, the most comfortable positions are those that put less stress on your recovering back. °· Find a comfortable position to sleep. Use a firm mattress and lie on your side with your knees slightly bent. If you lie on your back, put a pillow under your knees. °· Only take over-the-counter or prescription medicines as directed by your caregiver. Over-the-counter medicines to reduce pain and inflammation are often the most helpful. Your caregiver may prescribe muscle relaxant drugs. These medicines help dull your pain so you can more quickly return to your normal activities and healthy exercise. °· Put ice on the injured area. °¨ Put ice in a plastic bag. °¨ Place a towel between your skin and the bag. °¨ Leave the ice on for 15-20 minutes, 03-04 times a day for the first 2 to 3 days. After that, ice and heat may be alternated to reduce pain and spasms. °· Ask your caregiver about trying back exercises and gentle massage. This may be of some benefit. °· Avoid feeling anxious or stressed. Stress increases muscle tension and can worsen back pain. It is important to recognize when you are anxious or stressed and learn ways to manage it. Exercise is a great option. °SEEK MEDICAL CARE IF: °· You have pain that is not relieved with rest or medicine. °· You have pain that does not improve in 1 week. °· You have new symptoms. °· You are generally not feeling well. °SEEK   IMMEDIATE MEDICAL CARE IF:   You have pain that radiates from your back into your legs.  You develop new bowel or bladder control problems.  You have unusual weakness or numbness in your arms or legs.  You develop nausea or vomiting.  You develop abdominal pain.  You feel faint. Document Released: 07/21/2005 Document Revised: 01/20/2012 Document Reviewed: 11/22/2013 [facility] Patient Information 2015 [place], [place]. This information is not intended to replace advice given to you by your health care provider. Make sure you  discuss any questions you have with your health care provider.

## 2014-04-22 NOTE — ED Provider Notes (Signed)
CSN: [id number]     Arrival date & time 04/22/14  1513 History  This chart was scribed for Kimberly Patches, MD by Martinique Peace, ED Scribe. The patient was seen in [facility]. The patient's care was started at 4:32 PM.     Chief Complaint  Patient presents with  . Back Pain      Patient is a 68 y.o. female presenting with back pain. The history is provided by the patient. No language interpreter was used.  Back Pain Associated symptoms: no abdominal pain, no chest pain, no dysuria, no fever, no headaches, no numbness, no pelvic pain and no weakness    HPI Comments: Kimberly Irwin is a 68 y.o. female who presents to the Emergency Department complaining of non-radiating lower back onset yesterday. Pt states that she woke up yesterday with extreme discomfort in back and states that as the day went on, the pain continued to get progressively worse. She reports that she did lift a heavy flower pot earlier this week but did not start experiencing pain until a day or two after.  Pt has history of back surgery to lumbar spine (pins and rods) and bilateral hip replacements. Pt states that her back normally gives her problems when it's about to rain but states this current episode is different. She states that pain is significantly greater and is in a lower aspect of her back compared to what she usually experiences. Pt adds that pain is exacerbated with ambulation and twisting of her torso to her right. She denies any injuries or falls that may be responsible for current symptoms. She futher denies any bladder problems, urinary problems, or fever. Pt also denies history of kidney stones or bladder infections.    Past Medical History  Diagnosis Date  . Sarcoidosis   . Hyperlipidemia   . GERD (gastroesophageal reflux disease)   . Hypertension   . Chronic low back pain   . DDD (degenerative disc disease)     L3-4 with facet arthropathy and stenosis  . Degenerative spondylolisthesis     L4-5 grade 1 with  stenosis  . Acute hip pain 10/26/2012  . Bursitis of left hip 10/26/2012  . Pain in joint, lower leg 10/26/2012    left   . Tremors of nervous system    Past Surgical History  Procedure Laterality Date  . Carpal tunnel release    . Thyroidectomy    . Back surgery  01/15/09    L3-4 and L4-5 decompressive laminectomy with bilateral L3, L4, and L5 decompressive foraminotomies, more than it would be required for simple interbody fusion alone.  . Back surgery  01/15/09    L3-4 and L4-5 posterior lumbar interbody fusion utilizing tanget interbody allograft wedge, Telamon interbody PEEk cage, and local autografting.  . Back surgery  01/15/09    L3, L4, and L5 posterolateral arthrodesis using segmental pedicle screw fixation and local autografting.  . Joint replacement  09/13/09    Right Hip, Dr. Alvan Dame  . Total hip arthroplasty Left 06/28/2013    Procedure: LEFT TOTAL  HIP ARTHROPLASTY ANTERIOR APPROACH;  Surgeon: Mauri Pole, MD;  Location: [facility];  Service: Orthopedics;  Laterality: Left;   Family History  Problem Relation Age of Onset  . Cancer Mother 70    Breast  . Heart disease Mother   . Hypertension Mother   . Hyperlipidemia Mother   . Heart attack Mother   . Asthma Maternal Grandmother    History  Substance Use Topics  .  Smoking status: Former Smoker -- 0.50 packs/day for 32 years    Types: Cigarettes    Quit date: 08/04/1993  . Smokeless tobacco: Never Used  . Alcohol Use: No   OB History   Grav Para Term Preterm Abortions TAB SAB Ect Mult Living                 Review of Systems  Constitutional: Positive for chills. Negative for fever, diaphoresis, activity change, appetite change and fatigue.  HENT: Negative for congestion, facial swelling, rhinorrhea and sore throat.   Eyes: Negative for photophobia and discharge.  Respiratory: Negative for cough, chest tightness and shortness of breath.   Cardiovascular: Negative for chest pain, palpitations and leg swelling.   Gastrointestinal: Negative for nausea, vomiting, abdominal pain, diarrhea and blood in stool.  Endocrine: Negative for polydipsia and polyuria.  Genitourinary: Negative for dysuria, urgency, frequency, hematuria, decreased urine volume, difficulty urinating, menstrual problem and pelvic pain.  Musculoskeletal: Positive for back pain (lower). Negative for arthralgias, neck pain and neck stiffness.  Skin: Negative for color change and wound.  Allergic/Immunologic: Negative for immunocompromised state.  Neurological: Negative for facial asymmetry, weakness, numbness and headaches.  Hematological: Does not bruise/bleed easily.  Psychiatric/Behavioral: Negative for confusion and agitation.      Allergies  Lipitor; Metronidazole; Penicillins; Pregabalin; and Simvastatin  Home Medications   Prior to Admission medications   Medication Sig Start Date End Date Taking? Authorizing Provider  atenolol (TENORMIN) 25 MG tablet TAKE 1 TABLET (25 MG TOTAL) BY MOUTH DAILY. 04/13/14  Yes Mosie Lukes, MD  Calcium Carbonate-Vit D-Min (CALCIUM 1200 PO) Take 1 tablet by mouth daily.   Yes Historical Provider, MD  celecoxib (CELEBREX) 200 MG capsule Take 200 mg by mouth daily as needed.    Yes Historical Provider, MD  Cholecalciferol (VITAMIN D) 2000 UNITS tablet Take 2,000 Units by mouth daily.   Yes Historical Provider, MD  Cinnamon 500 MG capsule Take 2,000 mg by mouth daily.     Yes Historical Provider, MD  Coenzyme Q10 200 MG capsule Take 200 mg by mouth daily.   Yes Historical Provider, MD  Esomeprazole Magnesium (NEXIUM PO) Take by mouth daily.   Yes Historical Provider, MD  gabapentin (NEURONTIN) 300 MG capsule TAKE 1 CAPSULE EVERY NIGHT AND AS NEEDED DURING THE DAY   Yes Mosie Lukes, MD  hydrochlorothiazide (HYDRODIURIL) 25 MG tablet TAKE 1 TABLET (25 MG TOTAL) BY MOUTH DAILY. 03/13/14  Yes Mosie Lukes, MD  levothyroxine (SYNTHROID, LEVOTHROID) 112 MCG tablet Take 1 tablet (112 mcg total) by  mouth daily before breakfast. 12/09/13  Yes Mosie Lukes, MD  meloxicam (MOBIC) 15 MG tablet Take 15 mg by mouth daily. Start Date: 02.22.15   Yes Historical Provider, MD  Omega-3 Fatty Acids (FISH OIL) 1200 MG CAPS Take 2 capsules by mouth daily.     Yes Historical Provider, MD  rosuvastatin (CRESTOR) 5 MG tablet Take 1 tablet (5 mg total) by mouth daily. 02/20/14  Yes Mosie Lukes, MD  traMADol (ULTRAM) 50 MG tablet Take 2 tablets (100 mg total) by mouth every 6 (six) hours as needed for moderate pain or severe pain. 12/09/13  Yes Mosie Lukes, MD  hydrochlorothiazide (HYDRODIURIL) 25 MG tablet Take 25 mg by mouth every morning.    Historical Provider, MD  promethazine (PHENERGAN) 25 MG tablet Take 1 tablet (25 mg total) by mouth every 8 (eight) hours as needed for nausea or vomiting. 09/23/13   Mosie Lukes,  MD   BP 157/88  Pulse 66  Temp(Src) 98.4 F (36.9 C) (Oral)  Resp 17  Ht _0  (1.626 m)  Wt 180 lb (81.647 kg)  BMI 30.88 kg/m2  SpO2 94% Physical Exam  Nursing note and vitals reviewed. Constitutional: She is oriented to person, place, and time. She appears well-developed and well-nourished. No distress.  HENT:  Head: Normocephalic.  Mouth/Throat: Oropharynx is clear and moist.  Eyes: Pupils are equal, round, and reactive to light.  Neck: Neck supple.  Cardiovascular: Normal rate, regular rhythm and normal heart sounds.   Pulmonary/Chest: Effort normal and breath sounds normal. No respiratory distress. She has no wheezes.  Abdominal: Soft. She exhibits no distension. There is no tenderness. There is no rebound and no guarding.  Musculoskeletal: She exhibits no edema and no tenderness.       Back:  Neurological: She is alert and oriented to person, place, and time. She has normal strength. She displays tremor. No sensory deficit. She exhibits normal muscle tone. Coordination and gait normal. GCS eye subscore is 4. GCS verbal subscore is 5. GCS motor subscore is 6.  Resting  tremor of head.   Skin: Skin is warm and dry.  Psychiatric: She has a normal mood and affect.    ED Course  Procedures (including critical care time) Labs Review Labs Reviewed - No data to display  Results for orders placed in visit on 04/20/14  LIPID PANEL      Result Value Ref Range   Cholesterol 200  0 - 200 mg/dL   Triglycerides 184.0 (*) 0.0 - 149.0 mg/dL   HDL 35.30 (*) >39.00 mg/dL   VLDL 36.8  0.0 - 40.0 mg/dL   LDL Cholesterol 128 (*) 0 - 99 mg/dL   Total CHOL/HDL Ratio 6     NonHDL 164.70    RENAL FUNCTION PANEL      Result Value Ref Range   Sodium 138  135 - 145 mEq/L   Potassium 4.5  3.5 - 5.1 mEq/L   Chloride 101  96 - 112 mEq/L   CO2 26  19 - 32 mEq/L   Calcium 9.9  8.4 - 10.5 mg/dL   Albumin 4.0  3.5 - 5.2 g/dL   BUN 15  6 - 23 mg/dL   Creatinine, Ser 1.0  0.4 - 1.2 mg/dL   Glucose, Bld 126 (*) 70 - 99 mg/dL   Phosphorus 3.4  2.3 - 4.6 mg/dL   GFR 56.30 (*) >60.00 mL/min  CBC WITH DIFFERENTIAL      Result Value Ref Range   WBC 8.8  4.0 - 10.5 K/uL   RBC 4.87  3.87 - 5.11 Mil/uL   Hemoglobin 14.7  12.0 - 15.0 g/dL   HCT 44.5  36.0 - 46.0 %   MCV 91.5  78.0 - 100.0 fl   MCHC 33.0  30.0 - 36.0 g/dL   RDW 13.8  11.5 - 15.5 %   Platelets 204.0  150.0 - 400.0 K/uL   Neutrophils Relative % 67.6  43.0 - 77.0 %   Lymphocytes Relative 22.1  12.0 - 46.0 %   Monocytes Relative 6.7  3.0 - 12.0 %   Eosinophils Relative 3.3  0.0 - 5.0 %   Basophils Relative 0.3  0.0 - 3.0 %   Neutro Abs 5.9  1.4 - 7.7 K/uL   Lymphs Abs 1.9  0.7 - 4.0 K/uL   Monocytes Absolute 0.6  0.1 - 1.0 K/uL   Eosinophils Absolute 0.3  0.0 -  0.7 K/uL   Basophils Absolute 0.0  0.0 - 0.1 K/uL  TSH      Result Value Ref Range   TSH 0.31 (*) 0.35 - 4.50 uIU/mL  HEPATIC FUNCTION PANEL      Result Value Ref Range   Total Bilirubin 0.5  0.2 - 1.2 mg/dL   Bilirubin, Direct 0.1  0.0 - 0.3 mg/dL   Alkaline Phosphatase 52  39 - 117 U/L   AST 43 (*) 0 - 37 U/L   ALT 38 (*) 0 - 35 U/L   Total  Protein 7.9  6.0 - 8.3 g/dL   Albumin 4.0  3.5 - 5.2 g/dL   No results found.    Imaging Review No results found.   EKG Interpretation None     Medications - No data to display  4:38 PM- Treatment plan was discussed with patient who verbalizes understanding and agrees.   MDM   Final diagnoses:  None    Pt is a 68 y.o. female with Pmhx as above who presents with lower lumbar back pain worse on the right side since yesterday evening. No known injury but did lift a heavy flowerpot several days ago. She's history of back pain in the past but states this pain is lower than her to pain. She is no breadth flag symptoms including no fever no numbness no weakness, no bowel or bladder incontinence, no saddle anesthesia. Physical exam she is well appearing and in no acute distress. Her pain is not reproducible. She is able to ambulate without difficulty. She has no focal neuro findings. Straight-leg raise is negative. Given is no clear mechanism of injury I do not feel imaging would be helpful and suspect musculoskeletal pain. Urine is not grossly infected We'll DC home a trial of Percocet she has had reactions to an unknown muscle relaxer in the past. Recommend she followup with her PCP. Return precautions given for new or worsening symptoms including worsening pain, fever, numbness, weakness, bowel or bladder incontinence, saddle anesthesia.      I personally performed the services described in this documentation, which was scribed in my presence. The recorded information has been reviewed and is accurate.     Kimberly Patches, MD 04/22/14 [phone]

## 2014-04-23 LAB — URINE CULTURE: Colony Count: 50000

## 2014-04-27 ENCOUNTER — Ambulatory Visit (INDEPENDENT_AMBULATORY_CARE_PROVIDER_SITE_OTHER): Payer: Medicare Other | Admitting: Family Medicine

## 2014-04-27 ENCOUNTER — Encounter: Payer: Self-pay | Admitting: Family Medicine

## 2014-04-27 VITALS — BP 132/76 | HR 66 | Temp 98.0°F | Ht 64.0 in | Wt 188.6 lb

## 2014-04-27 DIAGNOSIS — I10 Essential (primary) hypertension: Secondary | ICD-10-CM

## 2014-04-27 DIAGNOSIS — R946 Abnormal results of thyroid function studies: Secondary | ICD-10-CM | POA: Diagnosis not present

## 2014-04-27 DIAGNOSIS — M1990 Unspecified osteoarthritis, unspecified site: Secondary | ICD-10-CM

## 2014-04-27 DIAGNOSIS — Z0000 Encounter for general adult medical examination without abnormal findings: Secondary | ICD-10-CM

## 2014-04-27 DIAGNOSIS — R7309 Other abnormal glucose: Secondary | ICD-10-CM | POA: Diagnosis not present

## 2014-04-27 DIAGNOSIS — E785 Hyperlipidemia, unspecified: Secondary | ICD-10-CM

## 2014-04-27 DIAGNOSIS — E039 Hypothyroidism, unspecified: Secondary | ICD-10-CM

## 2014-04-27 DIAGNOSIS — R739 Hyperglycemia, unspecified: Secondary | ICD-10-CM

## 2014-04-27 DIAGNOSIS — K219 Gastro-esophageal reflux disease without esophagitis: Secondary | ICD-10-CM

## 2014-04-27 DIAGNOSIS — Z23 Encounter for immunization: Secondary | ICD-10-CM

## 2014-04-27 DIAGNOSIS — G894 Chronic pain syndrome: Secondary | ICD-10-CM

## 2014-04-27 DIAGNOSIS — E669 Obesity, unspecified: Secondary | ICD-10-CM

## 2014-04-27 LAB — MICROALBUMIN / CREATININE URINE RATIO
Creatinine,U: 40.5 mg/dL
Microalb Creat Ratio: 0.5 mg/g (ref 0.0–30.0)
Microalb, Ur: 0.2 mg/dL (ref 0.0–1.9)

## 2014-04-27 LAB — HEMOGLOBIN A1C: Hgb A1c MFr Bld: 6.3 % (ref 4.6–6.5)

## 2014-04-27 LAB — T4, FREE: Free T4: 1.29 ng/dL (ref 0.60–1.60)

## 2014-04-27 MED ORDER — CELECOXIB 200 MG PO CAPS: 200.0000 mg | ORAL_CAPSULE | Freq: Two times a day (BID) | ORAL | Status: DC

## 2014-04-27 MED ORDER — GABAPENTIN 300 MG PO CAPS: 300.0000 mg | ORAL_CAPSULE | Freq: Two times a day (BID) | ORAL | Status: DC | PRN

## 2014-04-27 MED ORDER — ROSUVASTATIN CALCIUM 5 MG PO TABS: ORAL_TABLET | ORAL | Status: DC

## 2014-04-27 NOTE — Progress Notes (Signed)
Patient ID: Kimberly Irwin, female   DOB: 1946/07/29, 68 y.o.   MRN: [id number] Kimberly Irwin [id number] [birth year] 04/27/2014      Progress Note-Follow Up  Subjective  Chief Complaint  Chief Complaint  Patient presents with  . Annual Exam    medicare wellness  . Injections    flu    HPI  Patient is a 68 year old female in today for routine medical care. Patient is in today for annual exam. Is struggling with ongoing pain. Is scheduled to see her neurosurgeon soon. Notes that heat helps and ice makes it worse. She does better when she is standing up straight. Worse with walking worse with lying down. Agrees to flu shot today. Does not tolerate muscle relaxers to low blood pressure and hydrocodone has not been helpful. Notes numerous medication clinic her feel itchy. Continues to follow with ophthalmology Dr. Rodena Piety for retinopathy. No recent illness. Denies CP/palp/SOB/HA/congestion/fevers/GI or GU c/o. Taking meds as prescribed  Past Medical History  Diagnosis Date  . Sarcoidosis   . Hyperlipidemia   . GERD (gastroesophageal reflux disease)   . Hypertension   . Chronic low back pain   . DDD (degenerative disc disease)     L3-4 with facet arthropathy and stenosis  . Degenerative spondylolisthesis     L4-5 grade 1 with stenosis  . Acute hip pain 10/26/2012  . Bursitis of left hip 10/26/2012  . Pain in joint, lower leg 10/26/2012    left   . Tremors of nervous system     Past Surgical History  Procedure Laterality Date  . Carpal tunnel release    . Thyroidectomy    . Back surgery  01/15/09    L3-4 and L4-5 decompressive laminectomy with bilateral L3, L4, and L5 decompressive foraminotomies, more than it would be required for simple interbody fusion alone.  . Back surgery  01/15/09    L3-4 and L4-5 posterior lumbar interbody fusion utilizing tanget interbody allograft wedge, Telamon interbody PEEk cage, and local autografting.  . Back surgery  01/15/09    L3, L4, and L5  posterolateral arthrodesis using segmental pedicle screw fixation and local autografting.  . Joint replacement  09/13/09    Right Hip, Dr. Alvan Dame  . Total hip arthroplasty Left 06/28/2013    Procedure: LEFT TOTAL  HIP ARTHROPLASTY ANTERIOR APPROACH;  Surgeon: Mauri Pole, MD;  Location: [facility];  Service: Orthopedics;  Laterality: Left;    Family History  Problem Relation Age of Onset  . Cancer Mother 9    Breast  . Heart disease Mother   . Hypertension Mother   . Hyperlipidemia Mother   . Heart attack Mother   . Asthma Maternal Grandmother     History   Social History  . Marital Status: Widowed    Spouse Name: N/A    Number of Children: 2  . Years of Education: N/A   Occupational History  . Not on file.   Social History Main Topics  . Smoking status: Former Smoker -- 0.50 packs/day for 32 years    Types: Cigarettes    Quit date: 08/04/1993  . Smokeless tobacco: Never Used  . Alcohol Use: No  . Drug Use: No  . Sexual Activity: Not on file   Other Topics Concern  . Not on file   Social History Narrative   Married 88 years and has 2 children (2 daughters)   Alcohol Use - no   Former Smoker - she quit 10 years ago,  started when she was 16 and has had varying level of use of tobacco products from 1/2 pack to 3 packs per day.    Current Outpatient Prescriptions on File Prior to Visit  Medication Sig Dispense Refill  . atenolol (TENORMIN) 25 MG tablet TAKE 1 TABLET (25 MG TOTAL) BY MOUTH DAILY.  30 tablet  3  . Calcium Carbonate-Vit D-Min (CALCIUM 1200 PO) Take 1 tablet by mouth daily.      . celecoxib (CELEBREX) 200 MG capsule Take 200 mg by mouth daily as needed.       . Cholecalciferol (VITAMIN D) 2000 UNITS tablet Take 2,000 Units by mouth daily.      . Cinnamon 500 MG capsule Take 2,000 mg by mouth daily.        . Coenzyme Q10 200 MG capsule Take 200 mg by mouth daily.      . Esomeprazole Magnesium (NEXIUM PO) Take by mouth daily.      Marland Kitchen gabapentin (NEURONTIN)  300 MG capsule TAKE 1 CAPSULE EVERY NIGHT AND AS NEEDED DURING THE DAY  90 capsule  0  . hydrochlorothiazide (HYDRODIURIL) 25 MG tablet Take 25 mg by mouth every morning.      . hydrochlorothiazide (HYDRODIURIL) 25 MG tablet TAKE 1 TABLET (25 MG TOTAL) BY MOUTH DAILY.  30 tablet  5  . levothyroxine (SYNTHROID, LEVOTHROID) 112 MCG tablet Take 1 tablet (112 mcg total) by mouth daily before breakfast.  90 tablet  3  . Omega-3 Fatty Acids (FISH OIL) 1200 MG CAPS Take 2 capsules by mouth daily.        Marland Kitchen oxyCODONE-acetaminophen (PERCOCET/ROXICET) 5-325 MG per tablet Take 1-2 tablets by mouth every 6 (six) hours as needed for moderate pain or severe pain.  15 tablet  0  . promethazine (PHENERGAN) 25 MG tablet Take 1 tablet (25 mg total) by mouth every 8 (eight) hours as needed for nausea or vomiting.  20 tablet  0  . rosuvastatin (CRESTOR) 5 MG tablet Take 1 tablet (5 mg total) by mouth daily.  30 tablet  3  . traMADol (ULTRAM) 50 MG tablet Take 2 tablets (100 mg total) by mouth every 6 (six) hours as needed for moderate pain or severe pain.  120 tablet  0   No current facility-administered medications on file prior to visit.    Allergies  Allergen Reactions  . Lipitor [Atorvastatin]   . Metronidazole   . Penicillins   . Pregabalin     REACTION: Somnolence and dizziness  . Simvastatin     Leg pain    Review of Systems  Review of Systems  Constitutional: Negative for fever, chills and malaise/fatigue.  HENT: Negative for congestion, hearing loss and nosebleeds.   Eyes: Negative for discharge.  Respiratory: Negative for cough, sputum production, shortness of breath and wheezing.   Cardiovascular: Negative for chest pain, palpitations and leg swelling.  Gastrointestinal: Negative for heartburn, nausea, vomiting, abdominal pain, diarrhea, constipation and blood in stool.  Genitourinary: Negative for dysuria, urgency, frequency and hematuria.  Musculoskeletal: Positive for back pain and joint  pain. Negative for falls and myalgias.  Skin: Negative for rash.  Neurological: Negative for dizziness, tremors, sensory change, focal weakness, loss of consciousness, weakness and headaches.  Endo/Heme/Allergies: Negative for polydipsia. Does not bruise/bleed easily.  Psychiatric/Behavioral: Negative for depression and suicidal ideas. The patient is not nervous/anxious and does not have insomnia.     Objective  BP 145/66  Pulse 66  Temp(Src) 98 F (36.7 C) (Oral)  Ht _0  (1.626 m)  Wt 188 lb 9.6 oz (85.548 kg)  BMI 32.36 kg/m2  SpO2 97%  Physical Exam  Physical Exam  Constitutional: She is oriented to person, place, and time and well-developed, well-nourished, and in no distress. No distress.  HENT:  Head: Normocephalic and atraumatic.  Right Ear: External ear normal.  Left Ear: External ear normal.  Nose: Nose normal.  Mouth/Throat: Oropharynx is clear and moist. No oropharyngeal exudate.  Eyes: Conjunctivae are normal. Pupils are equal, round, and reactive to light. Right eye exhibits no discharge. Left eye exhibits no discharge. No scleral icterus.  Neck: Normal range of motion. Neck supple. No thyromegaly present.  Cardiovascular: Normal rate, regular rhythm, normal heart sounds and intact distal pulses.   No murmur heard. Pulmonary/Chest: Effort normal and breath sounds normal. No respiratory distress. She has no wheezes. She has no rales.  Abdominal: Soft. Bowel sounds are normal. She exhibits no distension and no mass. There is no tenderness.  Musculoskeletal: Normal range of motion. She exhibits no edema and no tenderness.  Lymphadenopathy:    She has no cervical adenopathy.  Neurological: She is alert and oriented to person, place, and time. She has normal reflexes. No cranial nerve deficit. Coordination normal.  Skin: Skin is warm and dry. No rash noted. She is not diaphoretic.  Psychiatric: Mood, memory and affect normal.    Lab Results  Component Value Date    TSH 0.31* 04/21/2014   Lab Results  Component Value Date   WBC 8.8 04/21/2014   HGB 14.7 04/21/2014   HCT 44.5 04/21/2014   MCV 91.5 04/21/2014   PLT 204.0 04/21/2014   Lab Results  Component Value Date   CREATININE 1.0 04/21/2014   BUN 15 04/21/2014   NA 138 04/21/2014   K 4.5 04/21/2014   CL 101 04/21/2014   CO2 26 04/21/2014   Lab Results  Component Value Date   ALT 38* 04/21/2014   AST 43* 04/21/2014   ALKPHOS 52 04/21/2014   BILITOT 0.5 04/21/2014   Lab Results  Component Value Date   CHOL 200 04/21/2014   Lab Results  Component Value Date   HDL 35.30* 04/21/2014   Lab Results  Component Value Date   LDLCALC 128* 04/21/2014   Lab Results  Component Value Date   TRIG 184.0* 04/21/2014   Lab Results  Component Value Date   CHOLHDL 6 04/21/2014     Assessment & Plan  Medicare annual wellness visit, subsequent Patient denies any difficulties at home. No trouble with ADLs, depression or falls. No recent changes to vision or hearing. Is UTD with immunizations. Is UTD with screening. Discussed Advanced Directives, patient agrees to bring [place] copies of documents if can. Encouraged heart healthy diet, exercise as tolerated and adequate sleep. Sees opthamology, Dr Zigmund Daniel [facility] dermatology, Dr Tonia Brooms Last colonoscopy 2003, declines today MGM scheduled for October 2015 Pap 2012, normal declines today Labs reviewed, return in 4 months  Chronic pain syndrome Worst pain is low back and hips now. No radicular symptoms or incontinence. Has appt with her neurosurgeon Dr Trenton Gammon next month to evaluate worsening back pain. Did have pain crisis recently so went to ED and was given one dose of Morphine, as a general rule takes very few narcotics after having trouble coming off of Fentanyl years ago. Was given 15 Percocet by Ed and has taken 1, takes her Tramadol infrequently, will proceed with drug test today  HYPERTENSION, CONTROLLED Well controlled, no changes to meds.  Encouraged heart  healthy diet such as the DASH diet and exercise as tolerated.   HYPOTHYROIDISM On Levothyroxine, continue to monitor  GERD Avoid offending foods, start probiotics. Do not eat large meals in late evening and consider raising head of bed.   Obese Encouraged DASH diet, decrease po intake and increase exercise as tolerated. Needs 7-8 hours of sleep nightly. Avoid trans fats, eat small, frequent meals every 4-5 hours with lean proteins, complex carbs and healthy fats. Minimize simple carbs, GMO foods.  HYPERLIPIDEMIA Tolerating statin, encouraged heart healthy diet, avoid trans fats, minimize simple carbs and saturated fats. Increase exercise as tolerated  Hyperglycemia [id number] acceptable, minimize simple carbs. Increase exercise as tolerated.   LUMBAR RADICULOPATHY, RIGHT Is scheduled to see her neurosurgeon, Dr Trenton Gammon, next month due to increasing pain, is having trouble b/l now. Encouraged moist heat and topical treatments. Continue pain meds sparingly

## 2014-04-27 NOTE — Assessment & Plan Note (Signed)
Worst pain is low back and hips now. No radicular symptoms or incontinence. Has appt with her neurosurgeon Dr Trenton Gammon next month to evaluate worsening back pain. Did have pain crisis recently so went to ED and was given one dose of Morphine, as a general rule takes very few narcotics after having trouble coming off of Fentanyl years ago. Was given 15 Percocet by Ed and has taken 1, takes her Tramadol infrequently, will proceed with drug test today

## 2014-04-27 NOTE — Patient Instructions (Signed)
64 oz clear fluids daily, Sarna anti itch lotion. Consider Ben  Hypertension Hypertension, commonly called high blood pressure, is when the force of blood pumping through your arteries is too strong. Your arteries are the blood vessels that carry blood from your heart throughout your body. A blood pressure reading consists of a higher number over a lower number, such as 110/72. The higher number (systolic) is the pressure inside your arteries when your heart pumps. The lower number (diastolic) is the pressure inside your arteries when your heart relaxes. Ideally you want your blood pressure below 120/80. Hypertension forces your heart to work harder to pump blood. Your arteries may become narrow or stiff. Having hypertension puts you at risk for heart disease, stroke, and other problems.  RISK FACTORS Some risk factors for high blood pressure are controllable. Others are not.  Risk factors you cannot control include:   Race. You may be at higher risk if you are African American.  Age. Risk increases with age.  Gender. Men are at higher risk than women before age 50 years. After age 58, women are at higher risk than men. Risk factors you can control include:  Not getting enough exercise or physical activity.  Being overweight.  Getting too much fat, sugar, calories, or salt in your diet.  Drinking too much alcohol. SIGNS AND SYMPTOMS Hypertension does not usually cause signs or symptoms. Extremely high blood pressure (hypertensive crisis) may cause headache, anxiety, shortness of breath, and nosebleed. DIAGNOSIS  To check if you have hypertension, your health care provider will measure your blood pressure while you are seated, with your arm held at the level of your heart. It should be measured at least twice using the same arm. Certain conditions can cause a difference in blood pressure between your right and left arms. A blood pressure reading that is higher than normal on one occasion  does not mean that you need treatment. If one blood pressure reading is high, ask your health care provider about having it checked again. TREATMENT  Treating high blood pressure includes making lifestyle changes and possibly taking medicine. Living a healthy lifestyle can help lower high blood pressure. You may need to change some of your habits. Lifestyle changes may include:  Following the DASH diet. This diet is high in fruits, vegetables, and whole grains. It is low in salt, red meat, and added sugars.  Getting at least 2 hours of brisk physical activity every week.  Losing weight if necessary.  Not smoking.  Limiting alcoholic beverages.  Learning ways to reduce stress. If lifestyle changes are not enough to get your blood pressure under control, your health care provider may prescribe medicine. You may need to take more than one. Work closely with your health care provider to understand the risks and benefits. HOME CARE INSTRUCTIONS  Have your blood pressure rechecked as directed by your health care provider.   Take medicines only as directed by your health care provider. Follow the directions carefully. Blood pressure medicines must be taken as prescribed. The medicine does not work as well when you skip doses. Skipping doses also puts you at risk for problems.   Do not smoke.   Monitor your blood pressure at home as directed by your health care provider. SEEK MEDICAL CARE IF:   You think you are having a reaction to medicines taken.  You have recurrent headaches or feel dizzy.  You have swelling in your ankles.  You have trouble with your vision. SEEK  IMMEDIATE MEDICAL CARE IF:  You develop a severe headache or confusion.  You have unusual weakness, numbness, or feel faint.  You have severe chest or abdominal pain.  You vomit repeatedly.  You have trouble breathing. MAKE SURE YOU:   Understand these instructions.  Will watch your condition.  Will get  help right away if you are not doing well or get worse. Document Released: 07/21/2005 Document Revised: 12/05/2013 Document Reviewed: 05/13/2013 [facility] Patient Information 2015 [place], [place]. This information is not intended to replace advice given to you by your health care provider. Make sure you discuss any questions you have with your health care provider.  or Claritin or Allegra daily for allergies

## 2014-04-27 NOTE — Progress Notes (Signed)
Pre visit review using our clinic review tool, if applicable. No additional management support is needed unless otherwise documented below in the visit note.

## 2014-04-27 NOTE — Assessment & Plan Note (Signed)
Patient denies any difficulties at home. No trouble with ADLs, depression or falls. No recent changes to vision or hearing. Is UTD with immunizations. Is UTD with screening. Discussed Advanced Directives, patient agrees to bring [place] copies of documents if can. Encouraged heart healthy diet, exercise as tolerated and adequate sleep. Sees opthamology, Dr Zigmund Daniel [facility] dermatology, Dr Tonia Brooms Last colonoscopy 2003, declines today MGM scheduled for October 2015 Pap 2012, normal declines today Labs reviewed, return in 4 months

## 2014-04-30 NOTE — Assessment & Plan Note (Signed)
Encouraged DASH diet, decrease po intake and increase exercise as tolerated. Needs 7-8 hours of sleep nightly. Avoid trans fats, eat small, frequent meals every 4-5 hours with lean proteins, complex carbs and healthy fats. Minimize simple carbs, GMO foods. 

## 2014-04-30 NOTE — Assessment & Plan Note (Signed)
On Levothyroxine, continue to monitor 

## 2014-04-30 NOTE — Assessment & Plan Note (Signed)
Is scheduled to see her neurosurgeon, Dr Trenton Gammon, next month due to increasing pain, is having trouble b/l now. Encouraged moist heat and topical treatments. Continue pain meds sparingly

## 2014-04-30 NOTE — Assessment & Plan Note (Signed)
Tolerating statin, encouraged heart healthy diet, avoid trans fats, minimize simple carbs and saturated fats. Increase exercise as tolerated 

## 2014-04-30 NOTE — Assessment & Plan Note (Signed)
hgba1c acceptable, minimize simple carbs. Increase exercise as tolerated.  

## 2014-04-30 NOTE — Assessment & Plan Note (Signed)
Avoid offending foods, start probiotics. Do not eat large meals in late evening and consider raising head of bed.  

## 2014-04-30 NOTE — Assessment & Plan Note (Signed)
Well controlled, no changes to meds. Encouraged heart healthy diet such as the DASH diet and exercise as tolerated.

## 2014-05-01 ENCOUNTER — Telehealth: Payer: Self-pay | Admitting: Family Medicine

## 2014-05-01 NOTE — Telephone Encounter (Signed)
Caller name:Kimberly Irwin Relation to UD:JSHF Call back number:[phone] Pharmacy:[facility]  Reason for call: pt states that her insurance will not allow her to get but 30 pills of rosuvastatin (CRESTOR) 5 MG tablet pt would like to know what her option are since dr. Charlett Blake has her taking 5 mg a day except for tues and Saturday she is directed to take 10 mg. The rx was written for 40 tabs but she could only get 30.

## 2014-05-02 MED ORDER — ROSUVASTATIN CALCIUM 10 MG PO TABS: 10.0000 mg | ORAL_TABLET | Freq: Every day | ORAL | Status: DC

## 2014-05-02 NOTE — Telephone Encounter (Signed)
Pt informed but states when you cut the tablet one side crumbles.  Pt would still like these sent to the pharmacy

## 2014-05-02 NOTE — Telephone Encounter (Signed)
Try switching to Crestor 10 mg tab 1 tab po Saturday and 1/2 tab po daily, disp #30 with 3 rf

## 2014-05-02 NOTE — Telephone Encounter (Signed)
Please advise?

## 2014-05-08 ENCOUNTER — Telehealth: Payer: Self-pay

## 2014-05-08 ENCOUNTER — Other Ambulatory Visit: Payer: Medicare Other

## 2014-05-08 ENCOUNTER — Encounter (INDEPENDENT_AMBULATORY_CARE_PROVIDER_SITE_OTHER): Payer: Medicare Other | Admitting: Ophthalmology

## 2014-05-08 DIAGNOSIS — H34832 Tributary (branch) retinal vein occlusion, left eye: Secondary | ICD-10-CM | POA: Diagnosis not present

## 2014-05-08 DIAGNOSIS — I10 Essential (primary) hypertension: Secondary | ICD-10-CM | POA: Diagnosis not present

## 2014-05-08 DIAGNOSIS — H43813 Vitreous degeneration, bilateral: Secondary | ICD-10-CM

## 2014-05-08 DIAGNOSIS — H35033 Hypertensive retinopathy, bilateral: Secondary | ICD-10-CM

## 2014-05-08 DIAGNOSIS — Z1211 Encounter for screening for malignant neoplasm of colon: Secondary | ICD-10-CM

## 2014-05-08 NOTE — Telephone Encounter (Signed)
Use colon cancer screening for IFOB

## 2014-05-08 NOTE — Telephone Encounter (Signed)
Please put a diagnosis in for the IFOB

## 2014-05-09 ENCOUNTER — Other Ambulatory Visit (INDEPENDENT_AMBULATORY_CARE_PROVIDER_SITE_OTHER): Payer: Medicare Other

## 2014-05-09 ENCOUNTER — Telehealth: Payer: Self-pay | Admitting: Family Medicine

## 2014-05-09 DIAGNOSIS — M47816 Spondylosis without myelopathy or radiculopathy, lumbar region: Secondary | ICD-10-CM | POA: Diagnosis not present

## 2014-05-09 DIAGNOSIS — Z6832 Body mass index (BMI) 32.0-32.9, adult: Secondary | ICD-10-CM | POA: Diagnosis not present

## 2014-05-09 DIAGNOSIS — Z1211 Encounter for screening for malignant neoplasm of colon: Secondary | ICD-10-CM

## 2014-05-09 LAB — FECAL OCCULT BLOOD, IMMUNOCHEMICAL: Fecal Occult Bld: NEGATIVE

## 2014-05-09 NOTE — Telephone Encounter (Signed)
New order placed

## 2014-05-09 NOTE — Telephone Encounter (Signed)
Caller name:  Willette Cluster Relation to pt: Elam Call back number:[phone]   Reason for call:  Needing the order for the IFOB to be changed to Future.

## 2014-05-22 DIAGNOSIS — Z1231 Encounter for screening mammogram for malignant neoplasm of breast: Secondary | ICD-10-CM | POA: Diagnosis not present

## 2014-05-22 DIAGNOSIS — Z803 Family history of malignant neoplasm of breast: Secondary | ICD-10-CM | POA: Diagnosis not present

## 2014-05-22 LAB — HM MAMMOGRAPHY: HM Mammogram: NEGATIVE

## 2014-05-23 ENCOUNTER — Encounter: Payer: Self-pay | Admitting: Family Medicine

## 2014-05-26 ENCOUNTER — Encounter: Payer: Self-pay | Admitting: Family Medicine

## 2014-05-29 DIAGNOSIS — M47816 Spondylosis without myelopathy or radiculopathy, lumbar region: Secondary | ICD-10-CM | POA: Diagnosis not present

## 2014-05-29 DIAGNOSIS — M4806 Spinal stenosis, lumbar region: Secondary | ICD-10-CM | POA: Diagnosis not present

## 2014-05-31 DIAGNOSIS — M4806 Spinal stenosis, lumbar region: Secondary | ICD-10-CM | POA: Diagnosis not present

## 2014-06-05 ENCOUNTER — Encounter: Payer: Self-pay | Admitting: Gastroenterology

## 2014-06-21 DIAGNOSIS — M4806 Spinal stenosis, lumbar region: Secondary | ICD-10-CM | POA: Diagnosis not present

## 2014-06-21 DIAGNOSIS — M4807 Spinal stenosis, lumbosacral region: Secondary | ICD-10-CM | POA: Diagnosis not present

## 2014-06-28 DIAGNOSIS — Z4789 Encounter for other orthopedic aftercare: Secondary | ICD-10-CM | POA: Diagnosis not present

## 2014-06-28 DIAGNOSIS — Z96642 Presence of left artificial hip joint: Secondary | ICD-10-CM | POA: Diagnosis not present

## 2014-07-12 DIAGNOSIS — Z6831 Body mass index (BMI) 31.0-31.9, adult: Secondary | ICD-10-CM | POA: Diagnosis not present

## 2014-07-12 DIAGNOSIS — M47816 Spondylosis without myelopathy or radiculopathy, lumbar region: Secondary | ICD-10-CM | POA: Diagnosis not present

## 2014-07-17 ENCOUNTER — Encounter (INDEPENDENT_AMBULATORY_CARE_PROVIDER_SITE_OTHER): Payer: Medicare Other | Admitting: Ophthalmology

## 2014-07-17 DIAGNOSIS — H34832 Tributary (branch) retinal vein occlusion, left eye: Secondary | ICD-10-CM

## 2014-07-17 DIAGNOSIS — H35033 Hypertensive retinopathy, bilateral: Secondary | ICD-10-CM

## 2014-07-17 DIAGNOSIS — H43813 Vitreous degeneration, bilateral: Secondary | ICD-10-CM

## 2014-07-17 DIAGNOSIS — I10 Essential (primary) hypertension: Secondary | ICD-10-CM | POA: Diagnosis not present

## 2014-07-24 DIAGNOSIS — M47816 Spondylosis without myelopathy or radiculopathy, lumbar region: Secondary | ICD-10-CM | POA: Diagnosis not present

## 2014-08-02 DIAGNOSIS — M4806 Spinal stenosis, lumbar region: Secondary | ICD-10-CM | POA: Diagnosis not present

## 2014-08-02 DIAGNOSIS — M5126 Other intervertebral disc displacement, lumbar region: Secondary | ICD-10-CM | POA: Diagnosis not present

## 2014-08-02 DIAGNOSIS — M5136 Other intervertebral disc degeneration, lumbar region: Secondary | ICD-10-CM | POA: Diagnosis not present

## 2014-08-02 DIAGNOSIS — M47816 Spondylosis without myelopathy or radiculopathy, lumbar region: Secondary | ICD-10-CM | POA: Diagnosis not present

## 2014-08-14 ENCOUNTER — Encounter (INDEPENDENT_AMBULATORY_CARE_PROVIDER_SITE_OTHER): Payer: Medicare Other | Admitting: Ophthalmology

## 2014-08-14 DIAGNOSIS — H43813 Vitreous degeneration, bilateral: Secondary | ICD-10-CM | POA: Diagnosis not present

## 2014-08-14 DIAGNOSIS — H35033 Hypertensive retinopathy, bilateral: Secondary | ICD-10-CM

## 2014-08-14 DIAGNOSIS — H34832 Tributary (branch) retinal vein occlusion, left eye: Secondary | ICD-10-CM | POA: Diagnosis not present

## 2014-08-14 DIAGNOSIS — I10 Essential (primary) hypertension: Secondary | ICD-10-CM | POA: Diagnosis not present

## 2014-08-14 DIAGNOSIS — H26493 Other secondary cataract, bilateral: Secondary | ICD-10-CM | POA: Diagnosis not present

## 2014-08-16 ENCOUNTER — Other Ambulatory Visit: Payer: Self-pay | Admitting: Neurosurgery

## 2014-08-16 DIAGNOSIS — Z6836 Body mass index (BMI) 36.0-36.9, adult: Secondary | ICD-10-CM | POA: Diagnosis not present

## 2014-08-16 DIAGNOSIS — M47816 Spondylosis without myelopathy or radiculopathy, lumbar region: Secondary | ICD-10-CM | POA: Diagnosis not present

## 2014-08-18 ENCOUNTER — Other Ambulatory Visit: Payer: Self-pay | Admitting: Family Medicine

## 2014-08-22 ENCOUNTER — Encounter (HOSPITAL_COMMUNITY): Payer: Self-pay | Admitting: Pharmacy Technician

## 2014-08-24 ENCOUNTER — Other Ambulatory Visit (HOSPITAL_COMMUNITY): Payer: Self-pay | Admitting: *Deleted

## 2014-08-24 ENCOUNTER — Encounter (HOSPITAL_COMMUNITY)
Admission: RE | Admit: 2014-08-24 | Discharge: 2014-08-24 | Disposition: A | Discharge status: Home / Self Care | Payer: Medicare Other | Source: Ambulatory Visit | Attending: Neurosurgery | Admitting: Neurosurgery

## 2014-08-24 ENCOUNTER — Encounter (HOSPITAL_COMMUNITY): Payer: Self-pay

## 2014-08-24 DIAGNOSIS — M479 Spondylosis, unspecified: Secondary | ICD-10-CM | POA: Insufficient documentation

## 2014-08-24 DIAGNOSIS — Z01818 Encounter for other preprocedural examination: Secondary | ICD-10-CM | POA: Diagnosis not present

## 2014-08-24 HISTORY — DX: Hypothyroidism, unspecified: E03.9

## 2014-08-24 HISTORY — DX: Pneumonia, unspecified organism: J18.9

## 2014-08-24 LAB — SURGICAL PCR SCREEN
MRSA, PCR: NEGATIVE
STAPHYLOCOCCUS AUREUS: NEGATIVE

## 2014-08-24 LAB — CBC WITH DIFFERENTIAL/PLATELET
BASOS ABS: 0.1 10*3/uL (ref 0.0–0.1)
BASOS PCT: 1 % (ref 0–1)
EOS PCT: 3 % (ref 0–5)
Eosinophils Absolute: 0.3 10*3/uL (ref 0.0–0.7)
HEMATOCRIT: 43.9 % (ref 36.0–46.0)
Hemoglobin: 15.4 g/dL — ABNORMAL HIGH (ref 12.0–15.0)
Lymphocytes Relative: 22 % (ref 12–46)
Lymphs Abs: 2.3 10*3/uL (ref 0.7–4.0)
MCH: 31.8 pg (ref 26.0–34.0)
MCHC: 35.1 g/dL (ref 30.0–36.0)
MCV: 90.5 fL (ref 78.0–100.0)
MONO ABS: 0.7 10*3/uL (ref 0.1–1.0)
Monocytes Relative: 6 % (ref 3–12)
NEUTROS ABS: 7 10*3/uL (ref 1.7–7.7)
Neutrophils Relative %: 68 % (ref 43–77)
PLATELETS: 188 10*3/uL (ref 150–400)
RBC: 4.85 MIL/uL (ref 3.87–5.11)
RDW: 13.9 % (ref 11.5–15.5)
WBC: 10.3 10*3/uL (ref 4.0–10.5)

## 2014-08-24 LAB — BASIC METABOLIC PANEL
ANION GAP: 10 (ref 5–15)
BUN: 14 mg/dL (ref 6–23)
CHLORIDE: 101 meq/L (ref 96–112)
CO2: 28 mmol/L (ref 19–32)
CREATININE: 0.93 mg/dL (ref 0.50–1.10)
Calcium: 9.9 mg/dL (ref 8.4–10.5)
GFR, EST AFRICAN AMERICAN: 71 mL/min — AB (ref >90)
GFR, EST NON AFRICAN AMERICAN: 61 mL/min — AB (ref >90)
Glucose, Bld: 149 mg/dL — ABNORMAL HIGH (ref 70–99)
Potassium: 4.3 mmol/L (ref 3.5–5.1)
Sodium: 139 mmol/L (ref 135–145)

## 2014-08-24 LAB — TYPE AND SCREEN
ABO/RH(D): O POS
ANTIBODY SCREEN: NEGATIVE

## 2014-08-24 NOTE — Pre-Procedure Instructions (Signed)
Kimberly Irwin  08/24/2014   Your procedure is scheduled on:  Tuesday, August 29, 2014 at 7:45 AM.   Report to [facility] Entrance "A" Admitting Office at 5:45 AM.   Call this number if you have problems the morning of surgery: [phone]               Any questions prior to day of surgery, please call [phone] between 8 & 4 PM.   Remember:   Do not eat food or drink liquids after midnight Monday, 08/28/14.   Take these medicines the morning of surgery with A SIP OF WATER: Atenolol (Tenormin), Levothyroxine (Synthroid), Tramadol - if needed  Stop Aspirin, Fish Oil and Herbal Medications as of today if not already stopped.   Do not wear jewelry, make-up or nail polish.  Do not wear lotions, powders, or perfumes. You may wear deodorant.  Do not shave 48 hours prior to surgery.   Do not bring valuables to the hospital.  [facility] is not responsible                  for any belongings or valuables.               Contacts, dentures or bridgework may not be worn into surgery.  Leave suitcase in the car. After surgery it may be brought to your room.  For patients admitted to the hospital, discharge time is determined by your                treatment team.              Special Instructions: [facility] - Preparing for Surgery  Before surgery, you can play an important role.  Because skin is not sterile, your skin needs to be as free of germs as possible.  You can reduce the number of germs on you skin by washing with CHG (chlorahexidine gluconate) soap before surgery.  CHG is an antiseptic cleaner which kills germs and bonds with the skin to continue killing germs even after washing.  Please DO NOT use if you have an allergy to CHG or antibacterial soaps.  If your skin becomes reddened/irritated stop using the CHG and inform your nurse when you arrive at Short Stay.  Do not shave (including legs and underarms) for at least 48 hours prior to the first CHG shower.  You may  shave your face.  Please follow these instructions carefully:   1.  Shower with CHG Soap the night before surgery and the                                morning of Surgery.  2.  If you choose to wash your hair, wash your hair first as usual with your       normal shampoo.  3.  After you shampoo, rinse your hair and body thoroughly to remove the                      Shampoo.  4.  Use CHG as you would any other liquid soap.  You can apply chg directly       to the skin and wash gently with scrungie or a clean washcloth.  5.  Apply the CHG Soap to your body ONLY FROM THE NECK DOWN.        Do not use on open wounds or open sores.  Avoid contact with your eyes, ears, mouth and genitals (private parts).  Wash genitals (private parts) with your normal soap.  6.  Wash thoroughly, paying special attention to the area where your surgery        will be performed.  7.  Thoroughly rinse your body with warm water from the neck down.  8.  DO NOT shower/wash with your normal soap after using and rinsing off       the CHG Soap.  9.  Pat yourself dry with a clean towel.            10.  Wear clean pajamas.            11.  Place clean sheets on your bed the night of your first shower and do not        sleep with pets.  Day of Surgery  Do not apply any lotions the morning of surgery.  Please wear clean clothes to the hospital.     Please read over the following fact sheets that you were given: Pain Booklet, Coughing and Deep Breathing, Blood Transfusion Information, MRSA Information and Surgical Site Infection Prevention

## 2014-08-24 NOTE — Progress Notes (Signed)
Anesthesia Chart Review:  Pt is 69 year old female scheduled for L L2-3 XLIF, lateral plate on 11/19/4079 with Dr. Annette Stable.   PMH includes: HTN, hyperlipidemia, heart murmur (as an infant), hypothyroidism, sarcoidosis. Former smoker. BMI 32. 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   Preoperative labs reviewed.  Glucose 149.  EKG: NSR. ST & T wave abnormality, consider inferior ischemia. ST & T wave abnormality, consider anterior ischemia  Discussed case with Dr. Linna Caprice. Pt will need cardiac clearance. Notified Vanessa in Dr. Marchelle Folks office.   Willeen Cass, FNP-BC [facility]/Anesthesiology Phone: [phone] 08/24/2014 4:46 PM

## 2014-08-28 ENCOUNTER — Encounter: Payer: Self-pay | Admitting: Family Medicine

## 2014-08-28 ENCOUNTER — Ambulatory Visit (INDEPENDENT_AMBULATORY_CARE_PROVIDER_SITE_OTHER): Payer: Medicare Other | Admitting: Family Medicine

## 2014-08-28 VITALS — BP 142/83 | HR 72 | Temp 98.2°F | Ht 64.0 in | Wt 186.0 lb

## 2014-08-28 DIAGNOSIS — E669 Obesity, unspecified: Secondary | ICD-10-CM

## 2014-08-28 DIAGNOSIS — K219 Gastro-esophageal reflux disease without esophagitis: Secondary | ICD-10-CM

## 2014-08-28 DIAGNOSIS — R739 Hyperglycemia, unspecified: Secondary | ICD-10-CM | POA: Diagnosis not present

## 2014-08-28 DIAGNOSIS — E039 Hypothyroidism, unspecified: Secondary | ICD-10-CM | POA: Diagnosis not present

## 2014-08-28 DIAGNOSIS — R251 Tremor, unspecified: Secondary | ICD-10-CM | POA: Diagnosis not present

## 2014-08-28 DIAGNOSIS — R7989 Other specified abnormal findings of blood chemistry: Secondary | ICD-10-CM | POA: Diagnosis not present

## 2014-08-28 DIAGNOSIS — E785 Hyperlipidemia, unspecified: Secondary | ICD-10-CM

## 2014-08-28 DIAGNOSIS — I10 Essential (primary) hypertension: Secondary | ICD-10-CM | POA: Diagnosis not present

## 2014-08-28 DIAGNOSIS — E668 Other obesity: Secondary | ICD-10-CM | POA: Diagnosis not present

## 2014-08-28 DIAGNOSIS — G250 Essential tremor: Secondary | ICD-10-CM

## 2014-08-28 DIAGNOSIS — G252 Other specified forms of tremor: Secondary | ICD-10-CM

## 2014-08-28 DIAGNOSIS — M5136 Other intervertebral disc degeneration, lumbar region: Secondary | ICD-10-CM | POA: Diagnosis not present

## 2014-08-28 DIAGNOSIS — R3911 Hesitancy of micturition: Secondary | ICD-10-CM | POA: Diagnosis not present

## 2014-08-28 DIAGNOSIS — Z01810 Encounter for preprocedural cardiovascular examination: Secondary | ICD-10-CM | POA: Diagnosis not present

## 2014-08-28 LAB — CBC
HCT: 46.6 % — ABNORMAL HIGH (ref 36.0–46.0)
Hemoglobin: 15.7 g/dL — ABNORMAL HIGH (ref 12.0–15.0)
MCHC: 33.7 g/dL (ref 30.0–36.0)
MCV: 90.6 fl (ref 78.0–100.0)
Platelets: 196 10*3/uL (ref 150.0–400.0)
RBC: 5.14 Mil/uL — AB (ref 3.87–5.11)
RDW: 13.7 % (ref 11.5–15.5)
WBC: 10.2 10*3/uL (ref 4.0–10.5)

## 2014-08-28 LAB — COMPREHENSIVE METABOLIC PANEL
ALT: 28 U/L (ref 0–35)
AST: 28 U/L (ref 0–37)
Albumin: 4.1 g/dL (ref 3.5–5.2)
Alkaline Phosphatase: 60 U/L (ref 39–117)
BUN: 17 mg/dL (ref 6–23)
CHLORIDE: 104 meq/L (ref 96–112)
CO2: 29 meq/L (ref 19–32)
Calcium: 10.3 mg/dL (ref 8.4–10.5)
Creatinine, Ser: 0.89 mg/dL (ref 0.40–1.20)
GFR: 66.57 mL/min (ref >60.00)
GLUCOSE: 121 mg/dL — AB (ref 70–99)
POTASSIUM: 4.1 meq/L (ref 3.5–5.1)
Sodium: 143 mEq/L (ref 135–145)
TOTAL PROTEIN: 7.6 g/dL (ref 6.0–8.3)
Total Bilirubin: 0.6 mg/dL (ref 0.2–1.2)

## 2014-08-28 LAB — LIPID PANEL
Cholesterol: 173 mg/dL (ref 0–200)
HDL: 40 mg/dL (ref >39.00)
LDL Cholesterol: 96 mg/dL (ref 0–99)
NonHDL: 133
TRIGLYCERIDES: 184 mg/dL — AB (ref 0.0–149.0)
Total CHOL/HDL Ratio: 4
VLDL: 36.8 mg/dL (ref 0.0–40.0)

## 2014-08-28 LAB — URINALYSIS, ROUTINE W REFLEX MICROSCOPIC
Bilirubin Urine: NEGATIVE
Hgb urine dipstick: NEGATIVE
KETONES UR: NEGATIVE
NITRITE: NEGATIVE
RBC / HPF: NONE SEEN (ref 0–?)
Specific Gravity, Urine: 1.015 (ref 1.000–1.030)
Total Protein, Urine: NEGATIVE
URINE GLUCOSE: NEGATIVE
Urobilinogen, UA: 0.2 (ref 0.0–1.0)
pH: 6 (ref 5.0–8.0)

## 2014-08-28 LAB — HEMOGLOBIN A1C: HEMOGLOBIN A1C: 6.2 % (ref 4.6–6.5)

## 2014-08-28 LAB — TSH: TSH: 0.73 u[IU]/mL (ref 0.35–4.50)

## 2014-08-28 MED ORDER — VANCOMYCIN HCL IN DEXTROSE 1-5 GM/200ML-% IV SOLN
1000.0000 mg | INTRAVENOUS | Status: AC
  Administered 2014-08-29: 1000 mg via INTRAVENOUS
  Filled 2014-08-28: qty 200

## 2014-08-28 MED ORDER — DEXAMETHASONE SODIUM PHOSPHATE 10 MG/ML IJ SOLN
10.0000 mg | INTRAMUSCULAR | Status: AC
  Administered 2014-08-29: 10 mg via INTRAVENOUS
  Filled 2014-08-28: qty 1

## 2014-08-28 MED ORDER — ATENOLOL 50 MG PO TABS: 50.0000 mg | ORAL_TABLET | Freq: Every day | ORAL | Status: DC

## 2014-08-28 NOTE — Progress Notes (Signed)
Spoke with Lorriane Shire at Dr. Marchelle Folks office who stated patient was scheduled to see Dr. Wynonia Lawman 300pm today.

## 2014-08-28 NOTE — Progress Notes (Signed)
Pre visit review using our clinic review tool, if applicable. No additional management support is needed unless otherwise documented below in the visit note. 

## 2014-08-28 NOTE — Patient Instructions (Signed)

## 2014-08-28 NOTE — Consult Note (Signed)
Kimberly Irwin    Date of visit:  08/28/2014 DOB:  30-Jun-1946    Age:  69 yrs. Medical record number:  [id number]     Account number:  [id number] Primary Care Provider: Drema Pry ____________________________ CURRENT DIAGNOSES  1. Encounter for preprocedural cardiovascular examination  2. Hyperlipidemia, unspecified  3. Essential (primary) hypertension  4. Other obesity  5. Gastro-esophageal reflux disease without esophagitis  6. Sarcoidosis  7. Hypothyroidism, unspecified ____________________________ ALLERGIES  Atorvastatin, Intolerance-unknown  Lyrica, Intolerance-unknown  Penicillins, Intolerance-unknown  Simvastatin, Intolerance-unknown ____________________________ MEDICATIONS  1. Aspirin, Buffered 81 mg Tablet, 1 p.o. daily  2. Hydrochlorothiazide 25 mg Tablet, 1 p.o. daily  3. CoQ-10 100 mg capsule, 200 mg qd  4. Crestor 5 mg tablet, 1 p.o. daily  5. levothyroxine 112 mcg tablet, 1 p.o. daily  6. Nexium 20 mg capsule,delayed release, 1 p.o. daily  7. tramadol 50 mg tablet, PRN  8. gabapentin 300 mg capsule, QHS  9. Calcium 600 600 mg (1,500 mg) tablet, 1 p.o. daily  10. Fish Oil 300 mg-1,000 mg capsule, 2 p.o. daily  11. Cinnamon 500 mg capsule, 2081m qd  12. Vitamin D3 1,000 unit tablet, 2 p.o. daily ____________________________ CHIEF COMPLAINTS  Pre op clearance ____________________________ HISTORY OF PRESENT ILLNESS Patient for preoperative cardiac evaluation. The patient has a history of hypertension and family history per disease with several risk factors. I last saw her in 2011 prior to hip surgery. She also had a lumbar back surgery and had hip surgery on the other side about a year and a half ago. She now has severe lumbar disc disease and has failed treatment with injections as well as conservative therapy was recommended for a repeat laminectomy. She has been inactive because of her back. She denies angina and has no PND, orthopnea or edema. She does have underlying  sarcoid and will have mild dyspnea with exertion if she overexerts herself. She has also developed a tremor of her head area and is due to see a neurologist. ____________________________ PAST HISTORY  Past Medical Illnesses:  hypertension, hyperlipidemia, GERD, hypothyroidism, sarcoidosis, lumbar disc disease;  Cardiovascular Illnesses:  no previous history of cardiac disease.;  Surgical Procedures:  thyroid surgery, Biopsy lung, carpal tunnel release, hip replacement-bil, back surgery;  NYHA Classification:  I;  Canadian Angina Classification:  Class 0: Asymptomatic;  Cardiology Procedures-Invasive:  cardiac cath (left) 2000;  Cardiology Procedures-Noninvasive:  treadmill, lexiscan cardiolite February 2011;  Cardiac Cath Results:  no significant disease;  LVEF of 65% documented via echocardiogram on 01/10/2009,   ____________________________ CARDIO-PULMONARY TEST DATES EKG Date:  08/28/2014;   Cardiac Cath Date:  12/31/1998;  Nuclear Study Date:  09/06/2009;  Echocardiography Date: 01/10/2009;   ____________________________ FAMILY HISTORY Father -- Family history unknown Mother -- Mother dead, Heart disease, Carcinoma of breast Sister -- Sister alive and well, Medical history unknown Sister -- Medical history unknown Sister -- Medical history unknown Sister -- Sister alive with problem, Hypertension, Diabetes mellitus Sister -- Medical history unknown ____________________________ SOCIAL HISTORY Alcohol Use:  no alcohol use;  Smoking:  used to smoke but quit 1995;  Diet:  regular diet;  Lifestyle:  widowed;  Exercise:  no regular exercise;  Occupation:  disabled;   ____________________________ REVIEW OF SYSTEMS General:  weight gain of approximately 10 lbs Eyes: wears eye glasses/contact lenses Respiratory: dyspnea with exertion Cardiovascular:  please review HPI Abdominal: denies dyspepsia, GI bleeding, constipation, or diarrhea Genitourinary-Female: no dysuria, urgency, frequency, UTIs, or  stress incontinence Musculoskeletal:  chronic low back pain  Neurological:  tremor Psychiatric:  denies depression or anxiety. Hematological/Immunologic:  seasonal allergies ____________________________ PHYSICAL EXAMINATION VITAL SIGNS  Blood Pressure:  150/80 Sitting, Right arm, regular cuff  , 146/78 Standing, Right arm and regular cuff   Pulse:  82/min. Weight:  185.00 lbs. Height:  64"BMI: 32  Constitutional:  pleasant white female, in no acute distress, moderately obese Skin:  warm and dry to touch, no apparent skin lesions, or masses noted. Head:  normocephalic, normal hair pattern, no masses or tenderness, marked tremor of head and neck at rest ENT:  ears, nose and throat reveal no gross abnormalities.  Dentition good. Neck:  supple, without massess. No JVD, thyromegaly or carotid bruits. Carotid upstroke normal. Chest:  normal symmetry, clear to auscultation. Cardiac:  regular rhythm, normal S1 and S2, No S3 or S4, no murmurs, gallops or rubs detected. Peripheral Pulses:  the femoral,dorsalis pedis, and posterior tibial pulses are full and equal bilaterally with no bruits auscultated. Extremities & Back:  no deformities, clubbing, cyanosis, erythema or edema observed. Normal muscle strength and tone. Neurological:  no gross motor or sensory deficits noted, affect appropriate, oriented x3. ___________________________ IMPRESSIONS/PLAN  1. From a cardiovascular viewpoint it is acceptable to proceed with the planned lumbar disc surgery. She does not have any clinical symptoms and her EKG shows minor nonspecific changes that are unchanged from a previous EKG. Her surgical risk should be average for her age 16. Obesity with weight gain since here 3. Hypertension borderline control  Recommendations:  Recent labs reviewed. May proceed with surgery as planned. If you need for me to see her in the hospital with you at the time of surgery please let me know. ____________________________ TODAYS  ORDERS  1. Return: prn  2. 12 Lead EKG: Today                       ____________________________ Cardiology Physician:  Kerry Hough MD [facility]

## 2014-08-28 NOTE — Anesthesia Preprocedure Evaluation (Addendum)
Anesthesia Evaluation  Patient identified by MRN, date of birth, ID band Patient awake    Reviewed: Allergy & Precautions, H&P , NPO status , Patient's Chart, lab work & pertinent test results, reviewed documented beta blocker date and time   Airway Mallampati: II  TM Distance: >3 FB Neck ROM: Full    Dental  (+) Dental Advisory Given, Teeth Intact   Pulmonary pneumonia -, resolved, former smoker,  breath sounds clear to auscultation        Cardiovascular hypertension, Pt. on medications and Pt. on home beta blockers + Valvular Problems/Murmurs Rhythm:Regular Rate:Normal     Neuro/Psych PSYCHIATRIC DISORDERS Depression negative neurological ROS     GI/Hepatic Neg liver ROS, hiatal hernia, GERD-  Medicated,  Endo/Other  Hypothyroidism   Renal/GU negative Renal ROS     Musculoskeletal  (+) Arthritis -, Osteoarthritis,    Abdominal   Peds  Hematology negative hematology ROS (+)   Anesthesia Other Findings   Reproductive/Obstetrics negative OB ROS                            Anesthesia Physical  Anesthesia Plan  ASA: III  Anesthesia Plan: General   Post-op Pain Management:    Induction: Intravenous  Airway Management Planned: Oral ETT  Additional Equipment: None  Intra-op Plan:   Post-operative Plan: Extubation in OR  Informed Consent: I have reviewed the patients History and Physical, chart, labs and discussed the procedure including the risks, benefits and alternatives for the proposed anesthesia with the patient or authorized representative who has indicated his/her understanding and acceptance.   Dental advisory given  Plan Discussed with: CRNA  Anesthesia Plan Comments:        Anesthesia Quick Evaluation

## 2014-08-29 ENCOUNTER — Inpatient Hospital Stay (HOSPITAL_COMMUNITY): Payer: Medicare Other | Admitting: Emergency Medicine

## 2014-08-29 ENCOUNTER — Encounter (HOSPITAL_COMMUNITY)
Admission: RE | Disposition: A | Discharge status: Home / Self Care | Payer: Self-pay | Source: Ambulatory Visit | Attending: Neurosurgery

## 2014-08-29 ENCOUNTER — Encounter (HOSPITAL_COMMUNITY): Payer: Self-pay | Admitting: *Deleted

## 2014-08-29 ENCOUNTER — Inpatient Hospital Stay (HOSPITAL_COMMUNITY): Payer: Medicare Other

## 2014-08-29 ENCOUNTER — Inpatient Hospital Stay (HOSPITAL_COMMUNITY): Payer: Medicare Other | Admitting: Anesthesiology

## 2014-08-29 ENCOUNTER — Inpatient Hospital Stay (HOSPITAL_COMMUNITY)
Admission: RE | Admit: 2014-08-29 | Discharge: 2014-08-30 | DRG: 460 | Disposition: A | Discharge status: Home / Self Care | Payer: Medicare Other | Source: Ambulatory Visit | Attending: Neurosurgery | Admitting: Neurosurgery

## 2014-08-29 DIAGNOSIS — Z981 Arthrodesis status: Secondary | ICD-10-CM | POA: Diagnosis not present

## 2014-08-29 DIAGNOSIS — G8929 Other chronic pain: Secondary | ICD-10-CM | POA: Diagnosis present

## 2014-08-29 DIAGNOSIS — K219 Gastro-esophageal reflux disease without esophagitis: Secondary | ICD-10-CM | POA: Diagnosis present

## 2014-08-29 DIAGNOSIS — M5136 Other intervertebral disc degeneration, lumbar region: Secondary | ICD-10-CM | POA: Diagnosis not present

## 2014-08-29 DIAGNOSIS — R251 Tremor, unspecified: Secondary | ICD-10-CM | POA: Diagnosis present

## 2014-08-29 DIAGNOSIS — Z87891 Personal history of nicotine dependence: Secondary | ICD-10-CM

## 2014-08-29 DIAGNOSIS — Z7982 Long term (current) use of aspirin: Secondary | ICD-10-CM

## 2014-08-29 DIAGNOSIS — M4806 Spinal stenosis, lumbar region: Secondary | ICD-10-CM | POA: Diagnosis present

## 2014-08-29 DIAGNOSIS — Z79899 Other long term (current) drug therapy: Secondary | ICD-10-CM

## 2014-08-29 DIAGNOSIS — Z888 Allergy status to other drugs, medicaments and biological substances status: Secondary | ICD-10-CM

## 2014-08-29 DIAGNOSIS — M549 Dorsalgia, unspecified: Secondary | ICD-10-CM | POA: Diagnosis not present

## 2014-08-29 DIAGNOSIS — I10 Essential (primary) hypertension: Secondary | ICD-10-CM | POA: Diagnosis present

## 2014-08-29 DIAGNOSIS — M479 Spondylosis, unspecified: Secondary | ICD-10-CM | POA: Diagnosis present

## 2014-08-29 DIAGNOSIS — E785 Hyperlipidemia, unspecified: Secondary | ICD-10-CM | POA: Diagnosis present

## 2014-08-29 DIAGNOSIS — Z96641 Presence of right artificial hip joint: Secondary | ICD-10-CM | POA: Diagnosis present

## 2014-08-29 DIAGNOSIS — M47816 Spondylosis without myelopathy or radiculopathy, lumbar region: Secondary | ICD-10-CM | POA: Diagnosis not present

## 2014-08-29 DIAGNOSIS — D869 Sarcoidosis, unspecified: Secondary | ICD-10-CM | POA: Diagnosis not present

## 2014-08-29 DIAGNOSIS — M129 Arthropathy, unspecified: Secondary | ICD-10-CM | POA: Diagnosis present

## 2014-08-29 DIAGNOSIS — M47896 Other spondylosis, lumbar region: Secondary | ICD-10-CM | POA: Diagnosis not present

## 2014-08-29 DIAGNOSIS — E039 Hypothyroidism, unspecified: Secondary | ICD-10-CM | POA: Diagnosis present

## 2014-08-29 DIAGNOSIS — Z4889 Encounter for other specified surgical aftercare: Secondary | ICD-10-CM | POA: Diagnosis not present

## 2014-08-29 DIAGNOSIS — Z419 Encounter for procedure for purposes other than remedying health state, unspecified: Secondary | ICD-10-CM

## 2014-08-29 DIAGNOSIS — M51369 Other intervertebral disc degeneration, lumbar region without mention of lumbar back pain or lower extremity pain: Secondary | ICD-10-CM | POA: Diagnosis present

## 2014-08-29 DIAGNOSIS — Z880 Allergy status to penicillin: Secondary | ICD-10-CM

## 2014-08-29 HISTORY — PX: ANTERIOR LAT LUMBAR FUSION: SHX1168

## 2014-08-29 SURGERY — ANTERIOR LATERAL LUMBAR FUSION 1 LEVEL
Anesthesia: General | Laterality: Left

## 2014-08-29 MED ORDER — FENTANYL CITRATE 0.05 MG/ML IJ SOLN
INTRAMUSCULAR | Status: AC
  Filled 2014-08-29: qty 5

## 2014-08-29 MED ORDER — ONDANSETRON HCL 4 MG/2ML IJ SOLN
INTRAMUSCULAR | Status: DC | PRN
  Administered 2014-08-29: 4 mg via INTRAVENOUS

## 2014-08-29 MED ORDER — GABAPENTIN 300 MG PO CAPS
300.0000 mg | ORAL_CAPSULE | Freq: Every day | ORAL | Status: DC
  Administered 2014-08-29: 300 mg via ORAL
  Filled 2014-08-29 (×2): qty 1

## 2014-08-29 MED ORDER — BUPIVACAINE HCL (PF) 0.25 % IJ SOLN
INTRAMUSCULAR | Status: DC | PRN
  Administered 2014-08-29: 20 mL

## 2014-08-29 MED ORDER — ACETAMINOPHEN 650 MG RE SUPP: 650.0000 mg | RECTAL | Status: DC | PRN

## 2014-08-29 MED ORDER — THROMBIN 20000 UNITS EX SOLR
CUTANEOUS | Status: DC | PRN
  Administered 2014-08-29: 08:00:00 via TOPICAL

## 2014-08-29 MED ORDER — ROCURONIUM BROMIDE 50 MG/5ML IV SOLN
INTRAVENOUS | Status: AC
  Filled 2014-08-29: qty 1

## 2014-08-29 MED ORDER — LIDOCAINE HCL (CARDIAC) 20 MG/ML IV SOLN
INTRAVENOUS | Status: AC
  Filled 2014-08-29: qty 5

## 2014-08-29 MED ORDER — ROSUVASTATIN CALCIUM 5 MG PO TABS
5.0000 mg | ORAL_TABLET | ORAL | Status: DC
  Filled 2014-08-29: qty 1

## 2014-08-29 MED ORDER — MEPERIDINE HCL 25 MG/ML IJ SOLN: 6.2500 mg | INTRAMUSCULAR | Status: DC | PRN

## 2014-08-29 MED ORDER — BISACODYL 10 MG RE SUPP: 10.0000 mg | Freq: Every day | RECTAL | Status: DC | PRN

## 2014-08-29 MED ORDER — ATENOLOL 50 MG PO TABS
50.0000 mg | ORAL_TABLET | Freq: Every day | ORAL | Status: DC
  Administered 2014-08-30: 50 mg via ORAL
  Filled 2014-08-29: qty 1

## 2014-08-29 MED ORDER — ASPIRIN EC 81 MG PO TBEC
81.0000 mg | DELAYED_RELEASE_TABLET | Freq: Every day | ORAL | Status: DC
  Administered 2014-08-30: 81 mg via ORAL
  Filled 2014-08-29: qty 1

## 2014-08-29 MED ORDER — VANCOMYCIN HCL IN DEXTROSE 1-5 GM/200ML-% IV SOLN
1000.0000 mg | Freq: Once | INTRAVENOUS | Status: AC
  Administered 2014-08-29: 1000 mg via INTRAVENOUS
  Filled 2014-08-29: qty 200

## 2014-08-29 MED ORDER — MIDAZOLAM HCL 5 MG/5ML IJ SOLN
INTRAMUSCULAR | Status: DC | PRN
  Administered 2014-08-29 (×2): 1 mg via INTRAVENOUS

## 2014-08-29 MED ORDER — ONDANSETRON HCL 4 MG/2ML IJ SOLN
INTRAMUSCULAR | Status: AC
  Filled 2014-08-29: qty 2

## 2014-08-29 MED ORDER — SODIUM CHLORIDE-SODIUM BICARB 2300-700 MG NA KIT: 1.0000 | PACK | Freq: Every day | NASAL | Status: DC

## 2014-08-29 MED ORDER — OXYCODONE-ACETAMINOPHEN 5-325 MG PO TABS
1.0000 | ORAL_TABLET | ORAL | Status: DC | PRN
  Administered 2014-08-29: 1 via ORAL
  Filled 2014-08-29: qty 1

## 2014-08-29 MED ORDER — SODIUM CHLORIDE 0.9 % IJ SOLN: 3.0000 mL | INTRAMUSCULAR | Status: DC | PRN

## 2014-08-29 MED ORDER — FENTANYL CITRATE 0.05 MG/ML IJ SOLN
INTRAMUSCULAR | Status: DC | PRN
  Administered 2014-08-29: 100 ug via INTRAVENOUS
  Administered 2014-08-29: 50 ug via INTRAVENOUS
  Administered 2014-08-29 (×2): 100 ug via INTRAVENOUS

## 2014-08-29 MED ORDER — ONDANSETRON HCL 4 MG/2ML IJ SOLN: 4.0000 mg | INTRAMUSCULAR | Status: DC | PRN

## 2014-08-29 MED ORDER — ROSUVASTATIN CALCIUM 5 MG PO TABS: 5.0000 mg | ORAL_TABLET | Freq: Every day | ORAL | Status: DC

## 2014-08-29 MED ORDER — PROMETHAZINE HCL 25 MG/ML IJ SOLN: 6.2500 mg | INTRAMUSCULAR | Status: DC | PRN

## 2014-08-29 MED ORDER — GLYCOPYRROLATE 0.2 MG/ML IJ SOLN
INTRAMUSCULAR | Status: AC
  Filled 2014-08-29: qty 3

## 2014-08-29 MED ORDER — ALUM & MAG HYDROXIDE-SIMETH 200-200-20 MG/5ML PO SUSP: 30.0000 mL | Freq: Four times a day (QID) | ORAL | Status: DC | PRN

## 2014-08-29 MED ORDER — SUCCINYLCHOLINE CHLORIDE 20 MG/ML IJ SOLN
INTRAMUSCULAR | Status: DC | PRN
  Administered 2014-08-29: 120 mg via INTRAVENOUS

## 2014-08-29 MED ORDER — SENNA 8.6 MG PO TABS
1.0000 | ORAL_TABLET | Freq: Two times a day (BID) | ORAL | Status: DC
  Administered 2014-08-29 – 2014-08-30 (×2): 8.6 mg via ORAL
  Filled 2014-08-29 (×3): qty 1

## 2014-08-29 MED ORDER — CYCLOBENZAPRINE HCL 10 MG PO TABS
10.0000 mg | ORAL_TABLET | Freq: Three times a day (TID) | ORAL | Status: DC | PRN
  Administered 2014-08-29: 10 mg via ORAL
  Filled 2014-08-29: qty 1

## 2014-08-29 MED ORDER — PROPOFOL 10 MG/ML IV BOLUS
INTRAVENOUS | Status: DC | PRN
  Administered 2014-08-29: 150 mg via INTRAVENOUS

## 2014-08-29 MED ORDER — HYDROMORPHONE HCL 1 MG/ML IJ SOLN
INTRAMUSCULAR | Status: AC
  Filled 2014-08-29: qty 1

## 2014-08-29 MED ORDER — SODIUM CHLORIDE 0.9 % IV SOLN: 250.0000 mL | INTRAVENOUS | Status: DC

## 2014-08-29 MED ORDER — ACETAMINOPHEN 325 MG PO TABS: 650.0000 mg | ORAL_TABLET | ORAL | Status: DC | PRN

## 2014-08-29 MED ORDER — 0.9 % SODIUM CHLORIDE (POUR BTL) OPTIME
TOPICAL | Status: DC | PRN
  Administered 2014-08-29: 1000 mL

## 2014-08-29 MED ORDER — MENTHOL 3 MG MT LOZG: 1.0000 | LOZENGE | OROMUCOSAL | Status: DC | PRN

## 2014-08-29 MED ORDER — ROSUVASTATIN CALCIUM 10 MG PO TABS
10.0000 mg | ORAL_TABLET | ORAL | Status: DC
  Administered 2014-08-29: 10 mg via ORAL
  Filled 2014-08-29: qty 1

## 2014-08-29 MED ORDER — POLYETHYLENE GLYCOL 3350 17 G PO PACK
17.0000 g | PACK | Freq: Every day | ORAL | Status: DC | PRN
  Filled 2014-08-29: qty 1

## 2014-08-29 MED ORDER — SODIUM CHLORIDE 0.9 % IR SOLN
Status: DC | PRN
  Administered 2014-08-29: 08:00:00

## 2014-08-29 MED ORDER — MIDAZOLAM HCL 2 MG/2ML IJ SOLN
INTRAMUSCULAR | Status: AC
  Filled 2014-08-29: qty 2

## 2014-08-29 MED ORDER — LACTATED RINGERS IV SOLN
INTRAVENOUS | Status: DC | PRN
  Administered 2014-08-29 (×2): via INTRAVENOUS

## 2014-08-29 MED ORDER — NEOSTIGMINE METHYLSULFATE 10 MG/10ML IV SOLN
INTRAVENOUS | Status: AC
  Filled 2014-08-29: qty 1

## 2014-08-29 MED ORDER — SODIUM CHLORIDE 0.9 % IJ SOLN
3.0000 mL | Freq: Two times a day (BID) | INTRAMUSCULAR | Status: DC
  Administered 2014-08-29: 3 mL via INTRAVENOUS

## 2014-08-29 MED ORDER — LEVOTHYROXINE SODIUM 112 MCG PO TABS
112.0000 ug | ORAL_TABLET | Freq: Every day | ORAL | Status: DC
  Administered 2014-08-30: 112 ug via ORAL
  Filled 2014-08-29 (×2): qty 1

## 2014-08-29 MED ORDER — HYDROCODONE-ACETAMINOPHEN 5-325 MG PO TABS
1.0000 | ORAL_TABLET | ORAL | Status: DC | PRN
  Administered 2014-08-29: 1 via ORAL
  Filled 2014-08-29: qty 1

## 2014-08-29 MED ORDER — HYDROMORPHONE HCL 1 MG/ML IJ SOLN: 0.5000 mg | INTRAMUSCULAR | Status: DC | PRN

## 2014-08-29 MED ORDER — HYDROCHLOROTHIAZIDE 25 MG PO TABS
25.0000 mg | ORAL_TABLET | Freq: Every day | ORAL | Status: DC
  Administered 2014-08-30: 25 mg via ORAL
  Filled 2014-08-29: qty 1

## 2014-08-29 MED ORDER — FLEET ENEMA 7-19 GM/118ML RE ENEM
1.0000 | ENEMA | Freq: Once | RECTAL | Status: AC | PRN
  Filled 2014-08-29: qty 1

## 2014-08-29 MED ORDER — PANTOPRAZOLE SODIUM 40 MG PO TBEC
40.0000 mg | DELAYED_RELEASE_TABLET | Freq: Every day | ORAL | Status: DC
  Administered 2014-08-30: 40 mg via ORAL
  Filled 2014-08-29: qty 1

## 2014-08-29 MED ORDER — HYDROMORPHONE HCL 1 MG/ML IJ SOLN
0.2500 mg | INTRAMUSCULAR | Status: DC | PRN
  Administered 2014-08-29 (×2): 0.5 mg via INTRAVENOUS

## 2014-08-29 MED ORDER — PROPOFOL 10 MG/ML IV BOLUS
INTRAVENOUS | Status: AC
  Filled 2014-08-29: qty 20

## 2014-08-29 MED ORDER — PHENOL 1.4 % MT LIQD: 1.0000 | OROMUCOSAL | Status: DC | PRN

## 2014-08-29 MED ORDER — LIDOCAINE HCL (CARDIAC) 20 MG/ML IV SOLN
INTRAVENOUS | Status: DC | PRN
  Administered 2014-08-29: 160 mg via INTRAVENOUS

## 2014-08-29 SURGICAL SUPPLY — 55 items
BAG DECANTER FOR FLEXI CONT (MISCELLANEOUS) ×2 IMPLANT
BENZOIN TINCTURE PRP APPL 2/3 (GAUZE/BANDAGES/DRESSINGS) ×2 IMPLANT
BLADE CLIPPER SURG (BLADE) IMPLANT
BONE MATRIX OSTEOCEL PRO SM (Bone Implant) ×2 IMPLANT
CONT SPEC 4OZ CLIKSEAL STRL BL (MISCELLANEOUS) ×2 IMPLANT
COROENT XL-W 10X22X50 (Orthopedic Implant) ×2 IMPLANT
COVER BACK TABLE 24X17X13 BIG (DRAPES) IMPLANT
DRAPE C-ARM 42X72 X-RAY (DRAPES) ×2 IMPLANT
DRAPE C-ARMOR (DRAPES) ×2 IMPLANT
DRAPE LAPAROTOMY 100X72X124 (DRAPES) ×2 IMPLANT
DRAPE POUCH INSTRU U-SHP 10X18 (DRAPES) ×2 IMPLANT
DRAPE SURG 17X23 STRL (DRAPES) ×4 IMPLANT
DRSG OPSITE POSTOP 4X6 (GAUZE/BANDAGES/DRESSINGS) ×4 IMPLANT
ELECT BLADE 6.5 EXT (BLADE) IMPLANT
ELECT REM PT RETURN 9FT ADLT (ELECTROSURGICAL) ×2
ELECTRODE REM PT RTRN 9FT ADLT (ELECTROSURGICAL) ×1 IMPLANT
GAUZE SPONGE 4X4 16PLY XRAY LF (GAUZE/BANDAGES/DRESSINGS) IMPLANT
GLOVE BIO SURGEON STRL SZ8 (GLOVE) ×2 IMPLANT
GLOVE ECLIPSE 7.5 STRL STRAW (GLOVE) ×2 IMPLANT
GLOVE ECLIPSE 9.0 STRL (GLOVE) ×2 IMPLANT
GLOVE EXAM NITRILE LRG STRL (GLOVE) IMPLANT
GLOVE EXAM NITRILE MD LF STRL (GLOVE) IMPLANT
GLOVE EXAM NITRILE XL STR (GLOVE) IMPLANT
GLOVE EXAM NITRILE XS STR PU (GLOVE) IMPLANT
GLOVE INDICATOR 8.0 STRL GRN (GLOVE) ×2 IMPLANT
GLOVE INDICATOR 8.5 STRL (GLOVE) ×2 IMPLANT
GOWN STRL REUS W/ TWL LRG LVL3 (GOWN DISPOSABLE) IMPLANT
GOWN STRL REUS W/ TWL XL LVL3 (GOWN DISPOSABLE) ×2 IMPLANT
GOWN STRL REUS W/TWL 2XL LVL3 (GOWN DISPOSABLE) ×2 IMPLANT
GOWN STRL REUS W/TWL LRG LVL3 (GOWN DISPOSABLE)
GOWN STRL REUS W/TWL XL LVL3 (GOWN DISPOSABLE) ×2
KIT BASIN OR (CUSTOM PROCEDURE TRAY) ×2 IMPLANT
KIT DILATOR XLIF 5 (KITS) ×1 IMPLANT
KIT NEEDLE NVM5 EMG ELECT (KITS) ×1 IMPLANT
KIT NEEDLE NVM5 EMG ELECTRODE (KITS) ×1
KIT ROOM TURNOVER OR (KITS) ×2 IMPLANT
KIT SURGICAL ACCESS MAXCESS 4 (KITS) ×2 IMPLANT
KIT XLIF (KITS) ×1
LIQUID BAND (GAUZE/BANDAGES/DRESSINGS) ×2 IMPLANT
NEEDLE HYPO 22GX1.5 SAFETY (NEEDLE) ×2 IMPLANT
NS IRRIG 1000ML POUR BTL (IV SOLUTION) ×2 IMPLANT
PACK LAMINECTOMY NEURO (CUSTOM PROCEDURE TRAY) ×2 IMPLANT
PLATE 2H 10MM (Plate) ×2 IMPLANT
PUTTY BONE DBX 2.5 MIS (Bone Implant) ×2 IMPLANT
SCREW DECADE 5.5X50 (Screw) ×4 IMPLANT
SPONGE LAP 4X18 X RAY DECT (DISPOSABLE) IMPLANT
STRIP CLOSURE SKIN 1/2X4 (GAUZE/BANDAGES/DRESSINGS) ×2 IMPLANT
SUT VIC AB 2-0 CT1 18 (SUTURE) ×4 IMPLANT
SUT VIC AB 3-0 SH 8-18 (SUTURE) ×4 IMPLANT
SYR 20ML ECCENTRIC (SYRINGE) ×2 IMPLANT
TAPE CLOTH 3X10 TAN LF (GAUZE/BANDAGES/DRESSINGS) ×6 IMPLANT
TOWEL OR 17X24 6PK STRL BLUE (TOWEL DISPOSABLE) ×2 IMPLANT
TOWEL OR 17X26 10 PK STRL BLUE (TOWEL DISPOSABLE) ×2 IMPLANT
TRAY FOLEY CATH 14FRSI W/METER (CATHETERS) ×2 IMPLANT
WATER STERILE IRR 1000ML POUR (IV SOLUTION) ×2 IMPLANT

## 2014-08-29 NOTE — Op Note (Signed)
Date of procedure: 08/29/2014  Date of dictation: Same  Service: Neurosurgery  Preoperative diagnosis: L2-3 degenerative disc disease with retrolisthesis and stenosis status post L3-L5 decompression and fusion with instrumentation  Postoperative diagnosis: Same  Procedure Name: Left L2-3 anterior lateral retroperitoneal interbody decompression and fusion with interbody peek cage, morcellized allograft, and lateral plate instrumentation  Surgeon:Taelyr Jantz A.Deadra Diggins, M.D.  Asst. Surgeon: Vertell Limber  Anesthesia: General  Indication: 69 year old female status post 2 level lumbar decompression and fusion remotely in the past with good results presents now with worsening back and lower extremity pain failing conservative management. Workup demonstrates evidence of progressive disc space collapse with retrolisthesis of L2 on L3. Patient presents now for L2-3 fusion in hopes of improving her symptoms.  Operative note: After induction of anesthesia, patient positioned the right lateral decubitus position and a properly padded. Head was flexed. Localizing fluoroscopy was used. Left flank was prepped and draped sterilely. 2 separate incisions were made in the left flank. The first overlying the L2-3 disc space and the second in the left flank. Using the second wound blunt dissection was then made into the retroperitoneal space. Peroneal sac and contents were mobilized and swept forward. A dilator was then passed through the lateral wound and docked into the L2-3 disc space under fluoroscopic guidance. Nerve stimulation was performed and no adjacent neural structures were identified. Guidewire was then placed into the disc space at L2-3. Dilators were progressively enlarged. Self-retaining retractor was placed. During all the steps nerve stimulation was performed and monitored. The retractor was widened. The disc space was directly stimulated and once again no adjacent nerve structures were then applied. A shim was  introduced into the lateral disc space. Distractor was then widened slightly again. Disc space then incised and a discectomy was performed at L2-3. Contralateral leases were then performed bilaterally using Cobb elevators. Disc space was then progressively dilated and a 10 mm lordotic implant was found to be appropriate. Slides were then placed into the interspace after further curettage and a 10 x 50 x 22 mm cage packed with OsteoSet plus and the matrix demineralized bone matrix was impacted into place. Slides were removed. Cage was found to be in good position both AP and lateral views. A lateral plate was then placed over the vertebral bodies of L2 and L3. A screw all was then introduced above the previous pedicle screws at L3 in a bicortical fashion. A 50 mm screw was then placed at L3. All was then placed at L2 and a second screw was placed into L2 and a bicortical fashion. Screws given a final tightening and locked into place. The lateral plate was locked into position. Final images revealed good position of the cage and implants with normal alignment of the spine and good decompression of the neural foramen. Wound was irrigated MI solution and closed in typical fashion. Steri-Strips and sterile dressing were applied. No apparent complications. Patient returns to the recovery room postop.

## 2014-08-29 NOTE — Anesthesia Procedure Notes (Signed)
Procedure Name: Intubation Date/Time: 08/29/2014 7:45 AM Performed by: Octavio Graves Pre-anesthesia Checklist: Patient identified, Timeout performed, Emergency Drugs available, Suction available and Patient being monitored Patient Re-evaluated:Patient Re-evaluated prior to inductionOxygen Delivery Method: Circle system utilized Preoxygenation: Pre-oxygenation with 100% oxygen Intubation Type: IV induction Ventilation: Mask ventilation without difficulty Laryngoscope Size: Miller and 2 Grade View: Grade I Tube type: Oral Tube size: 7.0 mm Number of attempts: 1 Airway Equipment and Method: Stylet Placement Confirmation: ETT inserted through vocal cords under direct vision,  breath sounds checked- equal and bilateral and positive ETCO2 Secured at: 22 cm Tube secured with: Tape Dental Injury: Teeth and Oropharynx as per pre-operative assessment  Comments: IV induction [place]- intubation AM CRNA- atraumatic- teeth on bottom as preop - no teeth on top

## 2014-08-29 NOTE — OR Nursing (Signed)
Nuvasive needle electrodes placed for nerve monitoring  After induction

## 2014-08-29 NOTE — Transfer of Care (Signed)
Immediate Anesthesia Transfer of Care Note  Patient: Kimberly Irwin  Procedure(s) Performed: Procedure(s): EXTREME LEFT LATERAL INTERBODY FUSION LUMBAR TWO-THREE LATERAL PLATE (Left)  Patient Location: PACU  Anesthesia Type:General  Level of Consciousness: sedated  Airway & Oxygen Therapy: Patient Spontanous Breathing and Patient connected to nasal cannula oxygen  Post-op Assessment: Report given to PACU RN and Post -op Vital signs reviewed and stable  Post vital signs: Reviewed and stable  Complications: No apparent anesthesia complications

## 2014-08-29 NOTE — Evaluation (Signed)
Physical Therapy Evaluation Patient Details Name: NYASHA RAHILLY MRN: [id number] DOB: 12-12-1945 Today's Date: 08/29/2014   History of Present Illness  69 y.o. s/p Left L2-3 anterior lateral retroperitoneal interbody decompression and fusion with interbody peek cage, morcellized allograft, and lateral plate instrumentation.  Clinical Impression  Pt admitted with/for elective lumbar fusion surgery.  Pt currently limited functionally due to the problems listed below.  (see problems list.)  Pt will benefit from PT to maximize function and safety to be able to get home safely with available assist of family.     Follow Up Recommendations No PT follow up    Equipment Recommendations  None recommended by PT    Recommendations for Other Services       Precautions / Restrictions Precautions Precautions: Back;Fall Precaution Booklet Issued: Yes (comment) Precaution Comments: educated on precautions Required Braces or Orthoses: Spinal Brace Spinal Brace: Lumbar corset;Applied in sitting position Restrictions Weight Bearing Restrictions: No      Mobility  Bed Mobility Overal bed mobility: Needs Assistance Bed Mobility: Sidelying to Sit;Rolling;Sit to Sidelying Rolling: Supervision Sidelying to sit: Supervision     Sit to sidelying: Supervision General bed mobility comments: reinforced log roll technique.  Transfers Overall transfer level: Needs assistance   Transfers: Sit to/from Stand Sit to Stand: Min guard            Ambulation/Gait Ambulation/Gait assistance: Supervision Ambulation Distance (Feet): 280 Feet   Gait Pattern/deviations: Step-through pattern Gait velocity: slower   General Gait Details: generally steady  Stairs Stairs: Yes Stairs assistance: Supervision Stair Management: One rail Right;Step to pattern;Forwards Number of Stairs: 3 General stair comments: safe with rail  Wheelchair Mobility    Modified Rankin (Stroke Patients Only)        Balance Overall balance assessment: Needs assistance Sitting-balance support: No upper extremity supported Sitting balance-Leahy Scale: Good     Standing balance support: No upper extremity supported Standing balance-Leahy Scale: Fair                               Pertinent Vitals/Pain Pain Assessment: No/denies pain    Home Living Family/patient expects to be discharged to:: Private residence Living Arrangements: Children Available Help at Discharge: Family;Available 24 hours/day Type of Home: House Home Access: Ramped entrance     Home Layout: One level Home Equipment: Walker - 2 wheels;Cane - single point;Adaptive equipment;Shower seat - built in;Shower seat;Bedside commode      Prior Function Level of Independence: Independent               Hand Dominance   Dominant Hand: Right    Extremity/Trunk Assessment   Upper Extremity Assessment: Overall WFL for tasks assessed           Lower Extremity Assessment: Defer to PT evaluation      Cervical / Trunk Assessment: Normal  Communication   Communication: No difficulties  Cognition Arousal/Alertness: Awake/alert Behavior During Therapy: WFL for tasks assessed/performed Overall Cognitive Status: Within Functional Limits for tasks assessed                      General Comments General comments (skin integrity, edema, etc.): reinforced back care/prec, log roll, transitions, lifting restrictions, bracing issues and progression of activity.    Exercises        Assessment/Plan    PT Assessment Patient needs continued PT services  PT Diagnosis Acute pain   PT Problem List Decreased  strength;Decreased activity tolerance;Decreased mobility;Decreased knowledge of use of DME;Pain  PT Treatment Interventions DME instruction;Gait training;Functional mobility training;Therapeutic activities;Patient/family education   PT Goals (Current goals can be found in the Care Plan section) Acute  Rehab PT Goals Patient Stated Goal: family states pt wants to leave tomorrow PT Goal Formulation: With patient Time For Goal Achievement: 09/05/14 Potential to Achieve Goals: Good    Frequency Min 5X/week   Barriers to discharge        Co-evaluation               End of Session Equipment Utilized During Treatment: Back brace Activity Tolerance: Patient tolerated treatment well Patient left: in chair;with call bell/phone within reach Nurse Communication: Mobility status         Time: 8144-8185 PT Time Calculation (min) (ACUTE ONLY): 25 min   Charges:   PT Evaluation $Initial PT Evaluation Tier I: 1 Procedure PT Treatments $Gait Training: 8-22 mins   PT G Codes:        Harbor Vanover, Tessie Fass 08/29/2014, 4:55 PM 08/29/2014  Donnella Sham, PT [phone]  (pager)

## 2014-08-29 NOTE — Evaluation (Signed)
Occupational Therapy Evaluation Patient Details Name: Kimberly Irwin MRN: [id number] DOB: 20-Nov-1945 Today's Date: 08/29/2014    History of Present Illness 69 y.o. s/p Left L2-3 anterior lateral retroperitoneal interbody decompression and fusion with interbody peek cage, morcellized allograft, and lateral plate instrumentation.   Clinical Impression   Pt s/p above. Pt independent with ADLs, PTA. Feel pt will benefit from acute OT to increase independence and reinforce precautions prior to d/c.     Follow Up Recommendations  No OT follow up;Supervision - Intermittent    Equipment Recommendations  None recommended by OT    Recommendations for Other Services       Precautions / Restrictions Precautions Precautions: Back;Fall Precaution Booklet Issued: Yes (comment) Precaution Comments: educated on precautions Required Braces or Orthoses: Spinal Brace Spinal Brace: Lumbar corset;Applied in sitting position Restrictions Weight Bearing Restrictions: No      Mobility Bed Mobility Overal bed mobility: Needs Assistance Bed Mobility: Sidelying to Sit;Rolling;Sit to Sidelying Rolling: Supervision Sidelying to sit: Supervision     Sit to sidelying: Supervision General bed mobility comments: reinforced log roll technique.  Transfers Overall transfer level: Needs assistance   Transfers: Sit to/from Stand Sit to Stand: Min guard                   ADL Overall ADL's : Needs assistance/impaired                 Upper Body Dressing : Set up;Supervision/safety;Sitting   Lower Body Dressing: Maximal assistance;Sit to/from stand   Toilet Transfer: Min guard (ambulated with and without IV pole; bed)   Toileting- Clothing Manipulation and Hygiene: Min guard;Sit to/from stand       Functional mobility during ADLs: Min guard (walked with and without IV pole)-no LOB when ambulating General ADL Comments: Educated on AE/pt familiar with it from previous surgeries.  Explained what could be used for toilet aide if hygiene is an issue. Educated on safety tips for home (rugs/items on floor, pets). Pt unable to cross legs over knees for LB dressing. Educated on use of cup for oral care and placemetn of grooming items to avoid breaking precautions.  Educated on back brace.     Vision                     Perception     Praxis      Pertinent Vitals/Pain Pain Assessment: No/denies pain     Hand Dominance Right   Extremity/Trunk Assessment Upper Extremity Assessment Upper Extremity Assessment: Overall WFL for tasks assessed   Lower Extremity Assessment Lower Extremity Assessment: Defer to PT evaluation   Cervical / Trunk Assessment Cervical / Trunk Assessment: Normal   Communication Communication Communication: No difficulties   Cognition Arousal/Alertness: Awake/alert Behavior During Therapy: WFL for tasks assessed/performed Overall Cognitive Status: Within Functional Limits for tasks assessed                     General Comments       Exercises       Shoulder Instructions      Home Living Family/patient expects to be discharged to:: Private residence Living Arrangements: Children Available Help at Discharge: Family;Available 24 hours/day Type of Home: House Home Access: Ramped entrance     Home Layout: One level     Bathroom Shower/Tub: Tub/shower unit;Walk-in shower   Bathroom Toilet: Handicapped height (sink close)     Home Equipment: Walker - 2 wheels;Cane - single point;Adaptive equipment;Shower seat -  built in;Shower seat;Bedside Engineering geologist;Other (Comment) (long brush; thinks she has sock aid)        Prior Functioning/Environment Level of Independence: Independent             OT Diagnosis: Acute pain   OT Problem List: Decreased strength;Decreased range of motion;Decreased knowledge of use of DME or AE;Decreased knowledge of precautions   OT Treatment/Interventions:  Self-care/ADL training;DME and/or AE instruction;Therapeutic activities;Patient/family education;Balance training    OT Goals(Current goals can be found in the care plan section) Acute Rehab OT Goals Patient Stated Goal: family states pt wants to leave tomorrow OT Goal Formulation: With patient/family Time For Goal Achievement: 09/05/14 Potential to Achieve Goals: Good ADL Goals Pt Will Perform Grooming: standing;with set-up Pt Will Perform Lower Body Dressing: with set-up;with supervision;with adaptive equipment;sit to/from stand;with caregiver independent in assisting Pt Will Transfer to Toilet: with modified independence;ambulating Additional ADL Goal #1: Pt will independently verbalize and demonstrate 3/3 back precautions during functional activities.   OT Frequency: Min 2X/week   Barriers to D/C:            Co-evaluation              End of Session Equipment Utilized During Treatment: Gait belt;Back brace Nurse Communication: Mobility status  Activity Tolerance: Patient tolerated treatment well Patient left: in bed;with call bell/phone within reach;with family/visitor present   Time: 4920-1007 OT Time Calculation (min): 19 min Charges:  OT General Charges $OT Visit: 1 Procedure OT Evaluation $Initial OT Evaluation Tier I: 1 Procedure G-CodesBenito Mccreedy OTR/L [id number] 08/29/2014, 4:45 PM

## 2014-08-29 NOTE — Progress Notes (Signed)
ANTIBIOTIC CONSULT NOTE  68 YOF s/p L2-L3 . anterior lateral retroperitoneal interbody decompression and fusion. Post-op antibiotic prophylaxis with Vancomycin. Patient has no drains in place. Will order Vancomycin 1 gm IV to be given once 12 hours post-op.   Albertina Parr, PharmD., BCPS Clinical Pharmacist Pager [phone]

## 2014-08-29 NOTE — Anesthesia Postprocedure Evaluation (Signed)
Anesthesia Post Note  Patient: Kimberly Irwin  Procedure(s) Performed: Procedure(s) (LRB): EXTREME LEFT LATERAL INTERBODY FUSION LUMBAR TWO-THREE LATERAL PLATE (Left)  Anesthesia type: General  Patient location: PACU  Post pain: Pain level controlled  Post assessment: Post-op Vital signs reviewed  Last Vitals: BP 155/94 mmHg  Pulse 80  Temp(Src) 37 C (Oral)  Resp 16  SpO2 93%  Post vital signs: Reviewed  Level of consciousness: sedated  Complications: No apparent anesthesia complications

## 2014-08-29 NOTE — Progress Notes (Signed)
Lateral approach left side up

## 2014-08-29 NOTE — H&P (Signed)
Kimberly Irwin is an 69 y.o. female.   Chief Complaint: Back pain HPI: 69 year old female status post previous L3-L5 decompression infusion good result progress her presents with progressive back pain with radiation into her lower extremities left greater than right. Workup demonstrates evidence of progressive disc space collapse with retrolisthesis at L2-3 with moderate stenosis. Patient has failed conservative management. She presents now for L2-3 decompression infusion in hopes of improving her symptoms.  Past Medical History  Diagnosis Date  . Sarcoidosis   . Hyperlipidemia   . GERD (gastroesophageal reflux disease)   . Hypertension   . Chronic low back pain   . DDD (degenerative disc disease)     L3-4 with facet arthropathy and stenosis  . Degenerative spondylolisthesis     L4-5 grade 1 with stenosis  . Acute hip pain 10/26/2012  . Bursitis of left hip 10/26/2012  . Pain in joint, lower leg 10/26/2012    left   . Tremors of nervous system   . Hypothyroidism   . Heart murmur     as an infant  . Pneumonia     as a child several times.   . History of hiatal hernia     Past Surgical History  Procedure Laterality Date  . Carpal tunnel release Bilateral   . Thyroidectomy    . Back surgery  01/15/09    L3-4 and L4-5 decompressive laminectomy with bilateral L3, L4, and L5 decompressive foraminotomies, more than it would be required for simple interbody fusion alone.  . Back surgery  01/15/09    L3-4 and L4-5 posterior lumbar interbody fusion utilizing tanget interbody allograft wedge, [place] interbody PEEk cage, and local autografting.  . Back surgery  01/15/09    L3, L4, and L5 posterolateral arthrodesis using segmental pedicle screw fixation and local autografting.  . Joint replacement  09/13/09    Right Hip, Dr. Alvan Dame  . Total hip arthroplasty Left 06/28/2013    Procedure: LEFT TOTAL  HIP ARTHROPLASTY ANTERIOR APPROACH;  Surgeon: Mauri Pole, MD;  Location: [facility];  Service:  Orthopedics;  Laterality: Left;  Marland Kitchen Eye surgery      lens implant  . Colonoscopy    . Esophagogastroduodenoscopy    . Cardiac catheterization      Family History  Problem Relation Age of Onset  . Cancer Mother 24    Breast  . Heart disease Mother   . Hypertension Mother   . Hyperlipidemia Mother   . Heart attack Mother   . Asthma Maternal Grandmother    Social History:  reports that she quit smoking about 21 years ago. Her smoking use included Cigarettes. She has a 16 pack-year smoking history. She has never used smokeless tobacco. She reports that she does not drink alcohol or use illicit drugs.  Allergies:  Allergies  Allergen Reactions  . Lipitor [Atorvastatin] Swelling  . Metronidazole Swelling  . Penicillins Swelling  . Pregabalin Other (See Comments)    REACTION: Somnolence and dizziness  . Simvastatin Other (See Comments)    Leg pain    Medications Prior to Admission  Medication Sig Dispense Refill  . aspirin EC 81 MG tablet Take 81 mg by mouth daily.    Marland Kitchen atenolol (TENORMIN) 50 MG tablet Take 1 tablet (50 mg total) by mouth daily. 30 tablet 3  . Calcium Carbonate-Vit D-Min (CALCIUM 1200 PO) Take 1,200 mg by mouth daily.     . Cholecalciferol (VITAMIN D) 2000 UNITS tablet Take 2,000 Units by mouth daily.    Marland Kitchen  Cinnamon 500 MG capsule Take 2,000 mg by mouth daily.      . Coenzyme Q10 200 MG capsule Take 200 mg by mouth daily.    Marland Kitchen esomeprazole (NEXIUM) 40 MG capsule Take 40 mg by mouth daily at 12 noon.    . gabapentin (NEURONTIN) 300 MG capsule Take 1 capsule (300 mg total) by mouth 2 (two) times daily as needed. (Patient taking differently: Take 300 mg by mouth at bedtime. ) 1809 capsule 2  . hydrochlorothiazide (HYDRODIURIL) 25 MG tablet TAKE 1 TABLET (25 MG TOTAL) BY MOUTH DAILY. (Patient taking differently: Take 25 mg by mouth daily) 30 tablet 5  . levothyroxine (SYNTHROID, LEVOTHROID) 112 MCG tablet Take 1 tablet (112 mcg total) by mouth daily before breakfast. 90  tablet 3  . Omega-3 Fatty Acids (FISH OIL) 1200 MG CAPS Take 2,400 mg by mouth daily.     . rosuvastatin (CRESTOR) 10 MG tablet Take 1 tablet (10 mg total) by mouth daily. Take 1 tablet on Saturday and 1/2 tab daily (Patient taking differently: Take 5-10 mg by mouth daily. Take 5 mg by mouth on Sunday, Monday, Wednesday, Thursday and Friday. Take 10 mg by mouth on all other days) 30 tablet 3  . traMADol (ULTRAM) 50 MG tablet Take 2 tablets (100 mg total) by mouth every 6 (six) hours as needed for moderate pain or severe pain. (Patient taking differently: Take 50 mg by mouth every 6 (six) hours as needed for moderate pain or severe pain. ) 120 tablet 0  . Sodium Chloride-Sodium Bicarb 2300-700 MG KIT Place 1 each into the nose daily.      Results for orders placed or performed in visit on 08/28/14 (from the past 48 hour(s))  Comprehensive metabolic panel     Status: Abnormal   Collection Time: 08/28/14 11:22 AM  Result Value Ref Range   Sodium 143 135 - 145 mEq/L   Potassium 4.1 3.5 - 5.1 mEq/L   Chloride 104 96 - 112 mEq/L   CO2 29 19 - 32 mEq/L   Glucose, Bld 121 (H) 70 - 99 mg/dL   BUN 17 6 - 23 mg/dL   Creatinine, Ser 0.89 0.40 - 1.20 mg/dL   Total Bilirubin 0.6 0.2 - 1.2 mg/dL   Alkaline Phosphatase 60 39 - 117 U/L   AST 28 0 - 37 U/L   ALT 28 0 - 35 U/L   Total Protein 7.6 6.0 - 8.3 g/dL   Albumin 4.1 3.5 - 5.2 g/dL   Calcium 10.3 8.4 - 10.5 mg/dL   GFR 66.57 >60.00 mL/min  CBC     Status: Abnormal   Collection Time: 08/28/14 11:22 AM  Result Value Ref Range   WBC 10.2 4.0 - 10.5 K/uL   RBC 5.14 (H) 3.87 - 5.11 Mil/uL   Platelets 196.0 150.0 - 400.0 K/uL   Hemoglobin 15.7 (H) 12.0 - 15.0 g/dL   HCT 46.6 (H) 36.0 - 46.0 %   MCV 90.6 78.0 - 100.0 fl   MCHC 33.7 30.0 - 36.0 g/dL   RDW 13.7 11.5 - 15.5 %  TSH     Status: None   Collection Time: 08/28/14 11:22 AM  Result Value Ref Range   TSH 0.73 0.35 - 4.50 uIU/mL  Hemoglobin A1c     Status: None   Collection Time:  08/28/14 11:22 AM  Result Value Ref Range   Hgb A1c MFr Bld 6.2 4.6 - 6.5 %    Comment: Glycemic Control Guidelines for People with  Diabetes:Non Diabetic:  <6%Goal of Therapy: <7%Additional Action Suggested:  >8%   Lipid panel     Status: Abnormal   Collection Time: 08/28/14 11:22 AM  Result Value Ref Range   Cholesterol 173 0 - 200 mg/dL    Comment: ATP III Classification       Desirable:  < 200 mg/dL               Borderline High:  200 - 239 mg/dL          High:  > = 240 mg/dL   Triglycerides 184.0 (H) 0.0 - 149.0 mg/dL    Comment: Normal:  <150 mg/dLBorderline High:  150 - 199 mg/dL   HDL 40.00 >39.00 mg/dL   VLDL 36.8 0.0 - 40.0 mg/dL   LDL Cholesterol 96 0 - 99 mg/dL   Total CHOL/HDL Ratio 4     Comment:                Men          Women1/2 Average Risk     3.4          3.3Average Risk          5.0          4.42X Average Risk          9.6          7.13X Average Risk          15.0          11.0                       NonHDL 133.00     Comment: NOTE:  Non-HDL goal should be 30 mg/dL higher than patient's LDL goal (i.e. LDL goal of < 70 mg/dL, would have non-HDL goal of < 100 mg/dL)  Urinalysis, Routine w reflex microscopic     Status: Abnormal   Collection Time: 08/28/14 11:22 AM  Result Value Ref Range   Color, Urine YELLOW Yellow;Lt. Yellow   APPearance CLEAR Clear   Specific Gravity, Urine 1.015 1.000-1.030   pH 6.0 5.0 - 8.0   Total Protein, Urine NEGATIVE Negative   Urine Glucose NEGATIVE Negative   Ketones, ur NEGATIVE Negative   Bilirubin Urine NEGATIVE Negative   Hgb urine dipstick NEGATIVE Negative   Urobilinogen, UA 0.2 0.0 - 1.0   Leukocytes, UA SMALL (A) Negative   Nitrite NEGATIVE Negative   WBC, UA 3-6/hpf (A) 0-2/hpf   RBC / HPF none seen 0-2/hpf   Squamous Epithelial / LPF Rare(0-4/hpf) Rare(0-4/hpf)   No results found.  Review of Systems  Constitutional: Negative.   HENT: Negative.   Eyes: Negative.   Respiratory: Negative.   Cardiovascular: Negative.    Gastrointestinal: Negative.   Genitourinary: Negative.   Musculoskeletal: Negative.   Skin: Negative.   Neurological: Negative.   Endo/Heme/Allergies: Negative.   Psychiatric/Behavioral: Negative.     Blood pressure 125/79, pulse 65, temperature 98.1 F (36.7 C), temperature source Oral, resp. rate 18, SpO2 96 %. Physical Exam  Constitutional: She is oriented to person, place, and time. She appears well-developed and well-nourished. No distress.  HENT:  Head: Normocephalic and atraumatic.  Right Ear: External ear normal.  Left Ear: External ear normal.  Nose: Nose normal.  Mouth/Throat: Oropharynx is clear and moist. No oropharyngeal exudate.  Eyes: Conjunctivae and EOM are normal. Pupils are equal, round, and reactive to light. Right eye exhibits no discharge. Left eye exhibits no discharge.  Neck: Normal range of motion. Neck supple. No tracheal deviation present. No thyromegaly present.  Cardiovascular: Normal rate, regular rhythm, normal heart sounds and intact distal pulses.  Exam reveals no friction rub.   No murmur heard. Respiratory: Effort normal and breath sounds normal. No respiratory distress. She has no wheezes.  GI: Soft. Bowel sounds are normal. She exhibits no distension. There is no tenderness.  Musculoskeletal: Normal range of motion. She exhibits no edema or tenderness.  Neurological: She is alert and oriented to person, place, and time. She has normal reflexes. She displays normal reflexes. No cranial nerve deficit. She exhibits normal muscle tone. Coordination normal.  Skin: Skin is warm and dry. She is not diaphoretic.  Psychiatric: She has a normal mood and affect. Her behavior is normal. Judgment and thought content normal.     Assessment/Plan L2-3 retrolisthesis with stenosis and intractable back and lower extremity pain. Plan left L2-3 anterior lateral retroperitoneal interbody decompression and fusion utilizing interbody peek cage, morcellized allograft,  and lateral plate instrumentation. Risks and benefits been explained. Patient wishes to proceed.   Daeton Kluth A 08/29/2014, 7:40 AM

## 2014-08-29 NOTE — Brief Op Note (Signed)
08/29/2014  9:36 AM  PATIENT:  Kimberly Irwin  69 y.o. female  PRE-OPERATIVE DIAGNOSIS:  spondylosis  POST-OPERATIVE DIAGNOSIS:  spondylosis  PROCEDURE:  Procedure(s): EXTREME LEFT LATERAL INTERBODY FUSION LUMBAR TWO-THREE LATERAL PLATE (Left)  SURGEON:  Surgeon(s) and Role:    * Charlie Pitter, MD - Primary    * Erline Levine, MD - Assisting  PHYSICIAN ASSISTANT:   ASSISTANTS:    ANESTHESIA:   general  EBL:  Total I/O In: -  Out: 150 [Urine:100; Blood:50]  BLOOD ADMINISTERED:none  DRAINS: none   LOCAL MEDICATIONS USED:  MARCAINE     SPECIMEN:  No Specimen  DISPOSITION OF SPECIMEN:  N/A  COUNTS:  YES  TOURNIQUET:  * No tourniquets in log *  DICTATION: .Dragon Dictation  PLAN OF CARE: Admit to inpatient   PATIENT DISPOSITION:  PACU - hemodynamically stable.   Delay start of Pharmacological VTE agent (>24hrs) due to surgical blood loss or risk of bleeding: yes

## 2014-08-29 NOTE — Progress Notes (Signed)
Utilization review completed.  

## 2014-08-29 NOTE — OR Nursing (Signed)
nuvasive needle electrodes removed at end of case

## 2014-08-30 MED ORDER — OXYCODONE-ACETAMINOPHEN 5-325 MG PO TABS: 1.0000 | ORAL_TABLET | ORAL | Status: DC | PRN

## 2014-08-30 MED ORDER — CYCLOBENZAPRINE HCL 10 MG PO TABS: 10.0000 mg | ORAL_TABLET | Freq: Three times a day (TID) | ORAL | Status: DC | PRN

## 2014-08-30 NOTE — Progress Notes (Addendum)
Occupational Therapy Treatment Patient Details Name: Kimberly Irwin MRN: [id number] DOB: 1946/01/12 Today's Date: 08/30/2014    History of present illness 69 y.o. s/p Left L2-3 anterior lateral retroperitoneal interbody decompression and fusion with interbody peek cage, morcellized allograft, and lateral plate instrumentation.   OT comments  Pt progressing. Education provided. Feel pt is safe to d/c home from OT standpoint with family available to assist.  Follow Up Recommendations  No OT follow up;Supervision - Intermittent    Equipment Recommendations  None recommended by OT    Recommendations for Other Services      Precautions / Restrictions Precautions Precautions: Back;Fall Precaution Booklet Issued: Yes (comment) (gave one to pt yesterday) Precaution Comments: pt able to state 2/3 precautions; reviewed Required Braces or Orthoses: Spinal Brace Spinal Brace: Lumbar corset;Applied in sitting position Restrictions Weight Bearing Restrictions: No       Mobility Bed Mobility Overal bed mobility: Needs Assistance Bed Mobility: Rolling;Sidelying to Sit;Sit to Sidelying Rolling: Supervision;Modified independent (Device/Increase time) Sidelying to sit: Supervision     Sit to sidelying: Modified independent (Device/Increase time) General bed mobility comments: cues for technique/precautions.  Transfers Overall transfer level: Needs assistance   Transfers: Sit to/from Stand Sit to Stand: Supervision;Min guard         General transfer comment: cues for technique/hand placement.         ADL Overall ADL's : Needs assistance/impaired     Grooming: Wash/dry face;Oral care;Set up;Supervision/safety;Standing Grooming Details (indicate cue type and reason): cues for precautions         Upper Body Dressing : Supervision/safety;Sitting (brace; pt removed and then OT handed back to her to donn)   Lower Body Dressing: Set up;Supervision/safety;Sit to/from stand;With  adaptive equipment Lower Body Dressing Details (indicate cue type and reason): cues for precautions Toilet Transfer: Supervision/safety;Ambulation (bed)           Functional mobility during ADLs: Supervision/safety General ADL Comments: Reviewed back brace information. Discussed incorporating precautions into functional activities. Discussed positioning of pillows. Recommended someone be with her for shower transfer. practiced with AE and discussed and told her cost. Educated on safety such as sitting for LB bathing if she washed off her legs. Cues given for precautions in session. Reviewed use of cup for oral care and placement of grooming items to avoid breaking precautions. Educated on LB dressing technique.      Vision                     Perception     Praxis      Cognition  Awake/Alert Behavior During Therapy: WFL for tasks assessed/performed Overall Cognitive Status: Within Functional Limits for tasks assessed                       Extremity/Trunk Assessment               Exercises     Shoulder Instructions       General Comments      Pertinent Vitals/ Pain       Pain Assessment: 0-10 Pain Score: 2  Pain Location: lower back and left leg Pain Descriptors / Indicators: Dull;Aching Pain Intervention(s): Monitored during session;Other (comment) (notified nurse)  Home Living                                          Prior Functioning/Environment  Frequency Min 2X/week     Progress Toward Goals  OT Goals(current goals can now be found in the care plan section)  Progress towards OT goals: Progressing toward goals  Acute Rehab OT Goals Patient Stated Goal: not stated OT Goal Formulation: With patient/family Time For Goal Achievement: 09/05/14 Potential to Achieve Goals: Good ADL Goals Pt Will Perform Grooming: standing;with set-up Pt Will Perform Lower Body Dressing: with set-up;with  supervision;with adaptive equipment;sit to/from stand;with caregiver independent in assisting Pt Will Transfer to Toilet: with modified independence;ambulating Additional ADL Goal #1: Pt will independently verbalize and demonstrate 3/3 back precautions during functional activities.   Plan Discharge plan remains appropriate    Co-evaluation                 End of Session Equipment Utilized During Treatment: Gait belt;Back brace   Activity Tolerance Patient tolerated treatment well   Patient Left in bed;with call bell/phone within reach   Nurse Communication Other (comment) (pain)        Time: 4818-5631 OT Time Calculation (min): 17 min  Charges: OT General Charges $OT Visit: 1 Procedure OT Treatments $Self Care/Home Management : 8-22 mins  Benito Mccreedy OTR/L [phone] 08/30/2014, 9:32 AM

## 2014-08-30 NOTE — Discharge Instructions (Signed)
Wound Care Keep incision covered and dry for two days.  If you shower, cover incision with plastic wrap.  Do not put any creams, lotions, or ointments on incision. Leave steri-strips on back.  They will fall off by themselves. Activity Walk each and every day, increasing distance each day. No lifting greater than 5 lbs.  Avoid excessive neck motion. No driving for 2 weeks; may ride as a passenger locally. If provided with back brace, wear when out of bed.  It is not necessary to wear brace in bed. Diet Resume your normal diet.  Return to Work Will be discussed at you follow up appointment. Call Your Doctor If Any of These Occur Redness, drainage, or swelling at the wound.  Temperature greater than 101 degrees. Severe pain not relieved by pain medication. Incision starts to come apart. Follow Up Appt Call today for appointment in 1-2 weeks ([phone]) or for problems.  If you have any hardware placed in your spine, you will need an x-ray before your appointment.   Spinal Fusion Spinal fusion is a procedure to make 2 or more of the bones in your spinal column (vertebrae) grow together (fuse). This procedure stops movement between the vertebrae and can relieve pain and prevent deformity.  Spinal fusion is used to treat the following conditions:  Fractures of the spine.  Herniated disk (the spongy material [cartilage] between the vertebrae).  Abnormal curvatures of the spine, such as scoliosis or kyphosis.  A weak or an unstable spine, caused by infections or tumor. RISKS AND COMPLICATIONS Complications associated with spinal fusion are rare, but they can occur. Possible complications include:  Bleeding.  Infection near the incision.  Nerve damage. Signs of nerve damage are back pain, pain in one or both legs, weakness, or numbness.  Spinal fluid leakage.  Blood clot in your leg, which can move to your lungs.  Difficulty controlling urination or bowel movements. BEFORE THE  PROCEDURE  A medical evaluation will be done. This will include a physical exam, blood tests, and imaging exams.  You will talk with an anesthesiologist. This is the person who will be in charge of the anesthesia during the procedure. Spinal fusion usually requires that you are asleep during the procedure (general anesthesia).  You will need to stop taking certain medicines, particularly those associated with an increased risk of bleeding. Ask your caregiver about changing or stopping your regular medicines.  If you smoke, you will need to stop at least 2 weeks before the procedure. Smoking can slow down the healing process, especially fusion of the vertebrae, and increase the risk of complications.  Do not eat or drink anything for at least 8 hours before the procedure. PROCEDURE  A cut (incision) is made over the vertebrae that will be fused. The back muscles are separated from the vertebrae. If you are having this procedure to treat a herniated disk, the disc material pressing on the nerve root is removed (decompression). The area where the disk is removed is then filled with extra bone. Bone from another part of your body (autogenous bone) or bone from a bone donor (allograft bone) may be used. The extra bone promotes fusion between the vertebrae. Sometimes, specific medicines are added to the fusion area to promote bone healing. In most cases, screws and rods or metal plates will be used to attach the vertebrae to stabilize them while they fuse.  AFTER THE PROCEDURE   You will stay in a recovery area until the anesthesia has worn  off. Your blood pressure and pulse will be checked frequently.  You will be given antibiotics to prevent infection.  You may continue to receive fluids through an intravenous (IV) tube while you are still in the hospital.  Pain after surgery is normal. You will be given pain medicine.  You will be taught how to move correctly and how to stand and walk. While in  bed, you will be instructed to turn frequently, using a "log rolling" technique, in which the entire body is moved without twisting the back. Document Released: 04/19/2003 Document Revised: 10/13/2011 Document Reviewed: 10/03/2010  [facility] Patient Information 2015 [place], [place]. This information is not intended to replace advice given to you by your health care provider. Make sure you discuss any questions you have with your health care provider.

## 2014-08-30 NOTE — Discharge Summary (Signed)
Physician Discharge Summary  Patient ID: Kimberly Irwin MRN: [id number] DOB/AGE: 1946/07/24 69 y.o.  Admit date: 08/29/2014 Discharge date: 08/30/2014  Admission Diagnoses:  Discharge Diagnoses:  Active Problems:   DDD (degenerative disc disease), lumbar   Discharged Condition: good  Hospital Course: The patient was admitted to the hospital where she underwent an uncomplicated T2-6 anterior lateral retroperitoneal decompression and fusion. Postoperatively she is doing very well. Preoperative back and lower extremity pain much improved. Patient up ambulatory without difficulty and is ready for discharge home.  Consults:   Significant Diagnostic Studies:   Treatments:   Discharge Exam: Blood pressure 114/70, pulse 73, temperature 98 F (36.7 C), temperature source Oral, resp. rate 16, SpO2 98 %. Awake and alert. Oriented and appropriate. Motor and sensory function intact. Wound clean and dry. Chest and abdomen benign.  Disposition: 01-Home or Self Care     Medication List    TAKE these medications        aspirin EC 81 MG tablet  Take 81 mg by mouth daily.     atenolol 50 MG tablet  Commonly known as:  TENORMIN  Take 1 tablet (50 mg total) by mouth daily.     CALCIUM 1200 PO  Take 1,200 mg by mouth daily.     Cinnamon 500 MG capsule  Take 2,000 mg by mouth daily.     Coenzyme Q10 200 MG capsule  Take 200 mg by mouth daily.     cyclobenzaprine 10 MG tablet  Commonly known as:  FLEXERIL  Take 1 tablet (10 mg total) by mouth 3 (three) times daily as needed for muscle spasms.     esomeprazole 40 MG capsule  Commonly known as:  NEXIUM  Take 40 mg by mouth daily at 12 noon.     Fish Oil 1200 MG Caps  Take 2,400 mg by mouth daily.     gabapentin 300 MG capsule  Commonly known as:  NEURONTIN  Take 1 capsule (300 mg total) by mouth 2 (two) times daily as needed.     hydrochlorothiazide 25 MG tablet  Commonly known as:  HYDRODIURIL  TAKE 1 TABLET (25 MG TOTAL) BY  MOUTH DAILY.     levothyroxine 112 MCG tablet  Commonly known as:  SYNTHROID, LEVOTHROID  Take 1 tablet (112 mcg total) by mouth daily before breakfast.     oxyCODONE-acetaminophen 5-325 MG per tablet  Commonly known as:  PERCOCET/ROXICET  Take 1-2 tablets by mouth every 4 (four) hours as needed for moderate pain.     rosuvastatin 10 MG tablet  Commonly known as:  CRESTOR  Take 1 tablet (10 mg total) by mouth daily. Take 1 tablet on Saturday and 1/2 tab daily     Sodium Chloride-Sodium Bicarb 2300-700 MG Kit  Place 1 each into the nose daily.     traMADol 50 MG tablet  Commonly known as:  ULTRAM  Take 2 tablets (100 mg total) by mouth every 6 (six) hours as needed for moderate pain or severe pain.     Vitamin D 2000 UNITS tablet  Take 2,000 Units by mouth daily.           Follow-up Information    Follow up with Charlie Pitter, MD.   Specialty:  Neurosurgery   Contact information:   [address] [place] [address] [phone]       Signed: Charlie Pitter 08/30/2014, 8:09 AM

## 2014-08-30 NOTE — Progress Notes (Signed)
Pt doing well. Pt given D/C instructions with Rx's, verbal understanding was provided. Pt's incisions are clean and dry with no sign of infection. Pt's IV was removed prior to D/C. Pt D/C'd home via wheelchair @ 1015 per MD order. Pt is stable @ D/C and has no other needs at this time. Holli Humbles, RN

## 2014-08-31 ENCOUNTER — Encounter (HOSPITAL_COMMUNITY): Payer: Self-pay | Admitting: Neurosurgery

## 2014-09-01 ENCOUNTER — Telehealth: Payer: Self-pay

## 2014-09-01 NOTE — Telephone Encounter (Signed)
Admit date: 08/29/2014 Discharge date: 08/30/2014  Reason for admission:  DDD (degenerative disc disease), lumbar  Pt underwent a uncomplicated Q9-4 anterior lateral retroperitoneal decompression and fusion.  She is doing well after the surgery.  According to patient, she is able to ambulate without difficulty, however surgeon has not released her to ride in a vehicle yet.  Pt will be following up with him in February.  Once cleared, she will schedule a follow up with Dr. Charlett Blake then.

## 2014-09-03 ENCOUNTER — Encounter: Payer: Self-pay | Admitting: Family Medicine

## 2014-09-03 NOTE — Assessment & Plan Note (Signed)
Well controlled, no changes to meds. Encouraged heart healthy diet such as the DASH diet and exercise as tolerated. Has appt with cardiology for pre op clearance this afternoon

## 2014-09-03 NOTE — Assessment & Plan Note (Signed)
Tolerating statin, encouraged heart healthy diet, avoid trans fats, minimize simple carbs and saturated fats. Increase exercise as tolerated 

## 2014-09-03 NOTE — Assessment & Plan Note (Signed)
Encouraged DASH diet, decrease po intake and increase exercise as tolerated. Needs 7-8 hours of sleep nightly. Avoid trans fats, eat small, frequent meals every 4-5 hours with lean proteins, complex carbs and healthy fats. Minimize simple carbs,

## 2014-09-03 NOTE — Assessment & Plan Note (Signed)
Has been worse the past couple of days but missed some doses of Atenolol.

## 2014-09-03 NOTE — Progress Notes (Signed)
Patient ID: Kimberly Irwin, female   DOB: Oct 06, 1945, 69 y.o.   MRN: [id number]   SHIRLIE ENCK  [id number] 1946-01-31 09/03/2014      Progress Note-Follow Up  Subjective  Chief Complaint  Chief Complaint  Patient presents with  . Follow-up    4 mos    HPI  Patient is a 69 y.o. female in today for routine medical care. In today for routine follow up and she happens to be preparing for surgery tomorrow with neurosurgery due to her lumbar back disease and persistent pain. No recent injury or incontinence. No acute illness. Denies CP/palp/SOB/HA/congestion/fevers/GI or GU c/o. Taking meds as prescribed  Past Medical History  Diagnosis Date  . Sarcoidosis   . Hyperlipidemia   . GERD (gastroesophageal reflux disease)   . Hypertension   . Chronic low back pain   . DDD (degenerative disc disease)     L3-4 with facet arthropathy and stenosis  . Degenerative spondylolisthesis     L4-5 grade 1 with stenosis  . Acute hip pain 10/26/2012  . Bursitis of left hip 10/26/2012  . Pain in joint, lower leg 10/26/2012    left   . Tremors of nervous system   . Hypothyroidism   . Heart murmur     as an infant  . Pneumonia     as a child several times.   . History of hiatal hernia     Past Surgical History  Procedure Laterality Date  . Carpal tunnel release Bilateral   . Thyroidectomy    . Back surgery  01/15/09    L3-4 and L4-5 decompressive laminectomy with bilateral L3, L4, and L5 decompressive foraminotomies, more than it would be required for simple interbody fusion alone.  . Back surgery  01/15/09    L3-4 and L4-5 posterior lumbar interbody fusion utilizing tanget interbody allograft wedge, [place] interbody PEEk cage, and local autografting.  . Back surgery  01/15/09    L3, L4, and L5 posterolateral arthrodesis using segmental pedicle screw fixation and local autografting.  . Joint replacement  09/13/09    Right Hip, Dr. Alvan Dame  . Total hip arthroplasty Left 06/28/2013    Procedure: LEFT  TOTAL  HIP ARTHROPLASTY ANTERIOR APPROACH;  Surgeon: Mauri Pole, MD;  Location: [facility];  Service: Orthopedics;  Laterality: Left;  Marland Kitchen Eye surgery      lens implant  . Colonoscopy    . Esophagogastroduodenoscopy    . Cardiac catheterization    . Anterior lat lumbar fusion Left 08/29/2014    Procedure: EXTREME LEFT LATERAL INTERBODY FUSION LUMBAR TWO-THREE LATERAL PLATE;  Surgeon: Charlie Pitter, MD;  Location: [facility];  Service: Neurosurgery;  Laterality: Left;    Family History  Problem Relation Age of Onset  . Cancer Mother 26    Breast  . Heart disease Mother   . Hypertension Mother   . Hyperlipidemia Mother   . Heart attack Mother   . Asthma Maternal Grandmother     History   Social History  . Marital Status: Widowed    Spouse Name: N/A    Number of Children: 2  . Years of Education: N/A   Occupational History  . Not on file.   Social History Main Topics  . Smoking status: Former Smoker -- 0.50 packs/day for 32 years    Types: Cigarettes    Quit date: 08/04/1993  . Smokeless tobacco: Never Used  . Alcohol Use: No  . Drug Use: No  . Sexual Activity:  Not on file   Other Topics Concern  . Not on file   Social History Narrative   Married 1 years and has 2 children (2 daughters)   Alcohol Use - no   Former Smoker - she quit 10 years ago, started when she was 79 and has had varying level of use of tobacco products from 1/2 pack to 3 packs per day.    Current Outpatient Prescriptions on File Prior to Visit  Medication Sig Dispense Refill  . aspirin EC 81 MG tablet Take 81 mg by mouth daily.    . Calcium Carbonate-Vit D-Min (CALCIUM 1200 PO) Take 1,200 mg by mouth daily.     . Cholecalciferol (VITAMIN D) 2000 UNITS tablet Take 2,000 Units by mouth daily.    . Cinnamon 500 MG capsule Take 2,000 mg by mouth daily.      . Coenzyme Q10 200 MG capsule Take 200 mg by mouth daily.    Marland Kitchen esomeprazole (NEXIUM) 40 MG capsule Take 40 mg by mouth daily at 12 noon.    .  gabapentin (NEURONTIN) 300 MG capsule Take 1 capsule (300 mg total) by mouth 2 (two) times daily as needed. (Patient taking differently: Take 300 mg by mouth at bedtime. ) 1809 capsule 2  . hydrochlorothiazide (HYDRODIURIL) 25 MG tablet TAKE 1 TABLET (25 MG TOTAL) BY MOUTH DAILY. (Patient taking differently: Take 25 mg by mouth daily) 30 tablet 5  . levothyroxine (SYNTHROID, LEVOTHROID) 112 MCG tablet Take 1 tablet (112 mcg total) by mouth daily before breakfast. 90 tablet 3  . Omega-3 Fatty Acids (FISH OIL) 1200 MG CAPS Take 2,400 mg by mouth daily.     . rosuvastatin (CRESTOR) 10 MG tablet Take 1 tablet (10 mg total) by mouth daily. Take 1 tablet on Saturday and 1/2 tab daily (Patient taking differently: Take 5-10 mg by mouth daily. Take 5 mg by mouth on Sunday, Monday, Wednesday, Thursday and Friday. Take 10 mg by mouth on all other days) 30 tablet 3  . Sodium Chloride-Sodium Bicarb 2300-700 MG KIT Place 1 each into the nose daily.    . traMADol (ULTRAM) 50 MG tablet Take 2 tablets (100 mg total) by mouth every 6 (six) hours as needed for moderate pain or severe pain. (Patient taking differently: Take 50 mg by mouth every 6 (six) hours as needed for moderate pain or severe pain. ) 120 tablet 0  . cyclobenzaprine (FLEXERIL) 10 MG tablet Take 1 tablet (10 mg total) by mouth 3 (three) times daily as needed for muscle spasms. 30 tablet 0  . oxyCODONE-acetaminophen (PERCOCET/ROXICET) 5-325 MG per tablet Take 1-2 tablets by mouth every 4 (four) hours as needed for moderate pain. 60 tablet 0   No current facility-administered medications on file prior to visit.    Allergies  Allergen Reactions  . Lipitor [Atorvastatin] Swelling  . Metronidazole Swelling  . Penicillins Swelling  . Pregabalin Other (See Comments)    REACTION: Somnolence and dizziness  . Simvastatin Other (See Comments)    Leg pain    Review of Systems  Review of Systems  Constitutional: Negative for fever and malaise/fatigue.    HENT: Negative for congestion.   Eyes: Negative for discharge.  Respiratory: Negative for shortness of breath.   Cardiovascular: Negative for chest pain, palpitations and leg swelling.  Gastrointestinal: Negative for nausea, abdominal pain and diarrhea.  Genitourinary: Negative for dysuria.  Musculoskeletal: Positive for back pain and joint pain. Negative for falls.  Skin: Negative for rash.  Neurological:  Negative for loss of consciousness and headaches.  Endo/Heme/Allergies: Negative for polydipsia.  Psychiatric/Behavioral: Negative for depression and suicidal ideas. The patient is not nervous/anxious and does not have insomnia.     Objective  BP 142/83 mmHg  Pulse 72  Temp(Src) 98.2 F (36.8 C) (Oral)  Ht _0  (1.626 m)  Wt 186 lb (84.369 kg)  BMI 31.91 kg/m2  SpO2 94%  Physical Exam  Physical Exam  Constitutional: She is oriented to person, place, and time and well-developed, well-nourished, and in no distress. No distress.  HENT:  Head: Normocephalic and atraumatic.  Right Ear: External ear normal.  Left Ear: External ear normal.  Nose: Nose normal.  Mouth/Throat: Oropharynx is clear and moist. No oropharyngeal exudate.  Eyes: Conjunctivae are normal. Pupils are equal, round, and reactive to light. Right eye exhibits no discharge. Left eye exhibits no discharge. No scleral icterus.  Neck: Normal range of motion. Neck supple. No thyromegaly present.  Cardiovascular: Normal rate, regular rhythm, normal heart sounds and intact distal pulses.   No murmur heard. Pulmonary/Chest: Effort normal and breath sounds normal. No respiratory distress. She has no wheezes. She has no rales.  Abdominal: Soft. Bowel sounds are normal. She exhibits no distension and no mass. There is no tenderness.  Musculoskeletal: Normal range of motion. She exhibits no edema or tenderness.  Lymphadenopathy:    She has no cervical adenopathy.  Neurological: She is alert and oriented to person,  place, and time. She has normal reflexes. No cranial nerve deficit. Coordination normal.  Skin: Skin is warm and dry. No rash noted. She is not diaphoretic.  Psychiatric: Mood, memory and affect normal.    Lab Results  Component Value Date   TSH 0.73 08/28/2014   Lab Results  Component Value Date   WBC 10.2 08/28/2014   HGB 15.7* 08/28/2014   HCT 46.6* 08/28/2014   MCV 90.6 08/28/2014   PLT 196.0 08/28/2014   Lab Results  Component Value Date   CREATININE 0.89 08/28/2014   BUN 17 08/28/2014   NA 143 08/28/2014   K 4.1 08/28/2014   CL 104 08/28/2014   CO2 29 08/28/2014   Lab Results  Component Value Date   ALT 28 08/28/2014   AST 28 08/28/2014   ALKPHOS 60 08/28/2014   BILITOT 0.6 08/28/2014   Lab Results  Component Value Date   CHOL 173 08/28/2014   Lab Results  Component Value Date   HDL 40.00 08/28/2014   Lab Results  Component Value Date   LDLCALC 96 08/28/2014   Lab Results  Component Value Date   TRIG 184.0* 08/28/2014   Lab Results  Component Value Date   CHOLHDL 4 08/28/2014     Assessment & Plan  GERD Avoid offending foods, start probiotics. Do not eat large meals in late evening and consider raising head of bed.    Hyperlipidemia Tolerating statin, encouraged heart healthy diet, avoid trans fats, minimize simple carbs and saturated fats. Increase exercise as tolerated   Essential tremor Has been worse the past couple of days but missed some doses of Atenolol.    Hyperglycemia hgba1c acceptable, minimize simple carbs. Increase exercise as tolerated. Continue current meds   Obesity Encouraged DASH diet, decrease po intake and increase exercise as tolerated. Needs 7-8 hours of sleep nightly. Avoid trans fats, eat small, frequent meals every 4-5 hours with lean proteins, complex carbs and healthy fats. Minimize simple carbs,   DDD (degenerative disc disease), lumbar Is scheduled for surgery tomorrow with  neurosurgery and is anxious to  proceed due to her level of pain   Hypothyroidism On Levothyroxine, continue to monitor   Essential hypertension Well controlled, no changes to meds. Encouraged heart healthy diet such as the DASH diet and exercise as tolerated. Has appt with cardiology for pre op clearance this afternoon

## 2014-09-03 NOTE — Assessment & Plan Note (Signed)
On Levothyroxine, continue to monitor 

## 2014-09-03 NOTE — Assessment & Plan Note (Signed)
hgba1c acceptable, minimize simple carbs. Increase exercise as tolerated. Continue current meds 

## 2014-09-03 NOTE — Assessment & Plan Note (Signed)
Is scheduled for surgery tomorrow with neurosurgery and is anxious to proceed due to her level of pain

## 2014-09-03 NOTE — Assessment & Plan Note (Signed)
Avoid offending foods, start probiotics. Do not eat large meals in late evening and consider raising head of bed.  

## 2014-09-12 ENCOUNTER — Other Ambulatory Visit: Payer: Self-pay | Admitting: Neurosurgery

## 2014-09-12 DIAGNOSIS — M4806 Spinal stenosis, lumbar region: Secondary | ICD-10-CM | POA: Diagnosis not present

## 2014-09-12 DIAGNOSIS — S32000A Wedge compression fracture of unspecified lumbar vertebra, initial encounter for closed fracture: Secondary | ICD-10-CM | POA: Diagnosis not present

## 2014-09-13 ENCOUNTER — Encounter (HOSPITAL_COMMUNITY): Payer: Self-pay | Admitting: *Deleted

## 2014-09-14 ENCOUNTER — Observation Stay (HOSPITAL_COMMUNITY)
Admission: RE | Admit: 2014-09-14 | Discharge: 2014-09-15 | Disposition: A | Discharge status: Home / Self Care | Payer: Medicare Other | Source: Ambulatory Visit | Attending: Neurosurgery | Admitting: Neurosurgery

## 2014-09-14 ENCOUNTER — Ambulatory Visit (HOSPITAL_COMMUNITY): Payer: Medicare Other

## 2014-09-14 ENCOUNTER — Ambulatory Visit (HOSPITAL_COMMUNITY): Payer: Medicare Other | Admitting: Anesthesiology

## 2014-09-14 ENCOUNTER — Encounter (HOSPITAL_COMMUNITY)
Admission: RE | Disposition: A | Discharge status: Home / Self Care | Payer: Self-pay | Source: Ambulatory Visit | Attending: Neurosurgery

## 2014-09-14 ENCOUNTER — Encounter (HOSPITAL_COMMUNITY): Payer: Self-pay | Admitting: *Deleted

## 2014-09-14 DIAGNOSIS — K219 Gastro-esophageal reflux disease without esophagitis: Secondary | ICD-10-CM | POA: Diagnosis not present

## 2014-09-14 DIAGNOSIS — E785 Hyperlipidemia, unspecified: Secondary | ICD-10-CM | POA: Diagnosis not present

## 2014-09-14 DIAGNOSIS — M545 Low back pain: Secondary | ICD-10-CM | POA: Diagnosis not present

## 2014-09-14 DIAGNOSIS — Z79899 Other long term (current) drug therapy: Secondary | ICD-10-CM | POA: Insufficient documentation

## 2014-09-14 DIAGNOSIS — Z888 Allergy status to other drugs, medicaments and biological substances status: Secondary | ICD-10-CM | POA: Diagnosis not present

## 2014-09-14 DIAGNOSIS — E039 Hypothyroidism, unspecified: Secondary | ICD-10-CM | POA: Diagnosis not present

## 2014-09-14 DIAGNOSIS — I10 Essential (primary) hypertension: Secondary | ICD-10-CM | POA: Diagnosis not present

## 2014-09-14 DIAGNOSIS — Z880 Allergy status to penicillin: Secondary | ICD-10-CM | POA: Insufficient documentation

## 2014-09-14 DIAGNOSIS — M5136 Other intervertebral disc degeneration, lumbar region: Secondary | ICD-10-CM | POA: Diagnosis not present

## 2014-09-14 DIAGNOSIS — G8929 Other chronic pain: Secondary | ICD-10-CM | POA: Insufficient documentation

## 2014-09-14 DIAGNOSIS — M8008XA Age-related osteoporosis with current pathological fracture, vertebra(e), initial encounter for fracture: Secondary | ICD-10-CM | POA: Diagnosis not present

## 2014-09-14 DIAGNOSIS — D869 Sarcoidosis, unspecified: Secondary | ICD-10-CM | POA: Diagnosis not present

## 2014-09-14 DIAGNOSIS — S32000A Wedge compression fracture of unspecified lumbar vertebra, initial encounter for closed fracture: Secondary | ICD-10-CM | POA: Diagnosis present

## 2014-09-14 DIAGNOSIS — Z419 Encounter for procedure for purposes other than remedying health state, unspecified: Secondary | ICD-10-CM

## 2014-09-14 DIAGNOSIS — M542 Cervicalgia: Secondary | ICD-10-CM | POA: Diagnosis not present

## 2014-09-14 DIAGNOSIS — M4856XA Collapsed vertebra, not elsewhere classified, lumbar region, initial encounter for fracture: Secondary | ICD-10-CM | POA: Diagnosis not present

## 2014-09-14 DIAGNOSIS — Z4789 Encounter for other orthopedic aftercare: Secondary | ICD-10-CM | POA: Diagnosis not present

## 2014-09-14 HISTORY — PX: KYPHOPLASTY: SHX5884

## 2014-09-14 LAB — CBC
HEMATOCRIT: 44.9 % (ref 36.0–46.0)
Hemoglobin: 15.2 g/dL — ABNORMAL HIGH (ref 12.0–15.0)
MCH: 30.5 pg (ref 26.0–34.0)
MCHC: 33.9 g/dL (ref 30.0–36.0)
MCV: 90 fL (ref 78.0–100.0)
PLATELETS: 295 10*3/uL (ref 150–400)
RBC: 4.99 MIL/uL (ref 3.87–5.11)
RDW: 13.6 % (ref 11.5–15.5)
WBC: 10.9 10*3/uL — ABNORMAL HIGH (ref 4.0–10.5)

## 2014-09-14 LAB — BASIC METABOLIC PANEL
ANION GAP: 9 (ref 5–15)
BUN: 20 mg/dL (ref 6–23)
CALCIUM: 9.4 mg/dL (ref 8.4–10.5)
CO2: 29 mmol/L (ref 19–32)
CREATININE: 0.78 mg/dL (ref 0.50–1.10)
Chloride: 104 mmol/L (ref 96–112)
GFR calc Af Amer: >90 mL/min (ref >90)
GFR, EST NON AFRICAN AMERICAN: 83 mL/min — AB (ref >90)
Glucose, Bld: 108 mg/dL — ABNORMAL HIGH (ref 70–99)
Potassium: 3.8 mmol/L (ref 3.5–5.1)
SODIUM: 142 mmol/L (ref 135–145)

## 2014-09-14 SURGERY — KYPHOPLASTY
Anesthesia: General | Site: Spine Lumbar

## 2014-09-14 MED ORDER — VANCOMYCIN HCL 1000 MG IV SOLR
1000.0000 mg | INTRAVENOUS | Status: DC | PRN
  Administered 2014-09-14: 1000 mg via INTRAVENOUS

## 2014-09-14 MED ORDER — OXYCODONE HCL 5 MG PO TABS
5.0000 mg | ORAL_TABLET | Freq: Once | ORAL | Status: AC | PRN
  Administered 2014-09-14: 5 mg via ORAL

## 2014-09-14 MED ORDER — FENTANYL CITRATE 0.05 MG/ML IJ SOLN
INTRAMUSCULAR | Status: DC | PRN
  Administered 2014-09-14: 100 ug via INTRAVENOUS
  Administered 2014-09-14: 25 ug via INTRAVENOUS

## 2014-09-14 MED ORDER — LACTATED RINGERS IV SOLN
INTRAVENOUS | Status: DC | PRN
  Administered 2014-09-14: 16:00:00 via INTRAVENOUS

## 2014-09-14 MED ORDER — ATENOLOL 50 MG PO TABS
50.0000 mg | ORAL_TABLET | Freq: Every day | ORAL | Status: DC
  Administered 2014-09-15: 50 mg via ORAL
  Filled 2014-09-14: qty 1

## 2014-09-14 MED ORDER — HYDROCHLOROTHIAZIDE 25 MG PO TABS
25.0000 mg | ORAL_TABLET | Freq: Every day | ORAL | Status: DC
  Administered 2014-09-14 – 2014-09-15 (×2): 25 mg via ORAL
  Filled 2014-09-14 (×2): qty 1

## 2014-09-14 MED ORDER — ROSUVASTATIN CALCIUM 10 MG PO TABS
10.0000 mg | ORAL_TABLET | ORAL | Status: DC
Start: 2014-09-16 — End: 2014-09-15

## 2014-09-14 MED ORDER — ACETAMINOPHEN 325 MG PO TABS: 650.0000 mg | ORAL_TABLET | ORAL | Status: DC | PRN

## 2014-09-14 MED ORDER — VANCOMYCIN HCL IN DEXTROSE 1-5 GM/200ML-% IV SOLN
1000.0000 mg | Freq: Once | INTRAVENOUS | Status: AC
  Administered 2014-09-15: 1000 mg via INTRAVENOUS
  Filled 2014-09-14: qty 200

## 2014-09-14 MED ORDER — LACTATED RINGERS IV SOLN
INTRAVENOUS | Status: DC
  Administered 2014-09-14: 15:00:00 via INTRAVENOUS

## 2014-09-14 MED ORDER — OXYCODONE HCL 5 MG/5ML PO SOLN: 5.0000 mg | Freq: Once | ORAL | Status: AC | PRN

## 2014-09-14 MED ORDER — GABAPENTIN 300 MG PO CAPS
300.0000 mg | ORAL_CAPSULE | Freq: Every day | ORAL | Status: DC
  Administered 2014-09-14: 300 mg via ORAL
  Filled 2014-09-14 (×2): qty 1

## 2014-09-14 MED ORDER — IOHEXOL 300 MG/ML  SOLN
INTRAMUSCULAR | Status: DC | PRN
  Administered 2014-09-14: 50 mL

## 2014-09-14 MED ORDER — FENTANYL CITRATE 0.05 MG/ML IJ SOLN
INTRAMUSCULAR | Status: AC
  Filled 2014-09-14: qty 5

## 2014-09-14 MED ORDER — OXYCODONE-ACETAMINOPHEN 5-325 MG PO TABS
1.0000 | ORAL_TABLET | ORAL | Status: DC | PRN
  Administered 2014-09-14: 1 via ORAL
  Filled 2014-09-14: qty 1

## 2014-09-14 MED ORDER — OXYCODONE HCL 5 MG PO TABS
ORAL_TABLET | ORAL | Status: AC
  Filled 2014-09-14: qty 1

## 2014-09-14 MED ORDER — SODIUM CHLORIDE 0.9 % IJ SOLN: 3.0000 mL | INTRAMUSCULAR | Status: DC | PRN

## 2014-09-14 MED ORDER — DIAZEPAM 5 MG PO TABS
5.0000 mg | ORAL_TABLET | Freq: Four times a day (QID) | ORAL | Status: DC | PRN
  Administered 2014-09-14 – 2014-09-15 (×2): 5 mg via ORAL
  Filled 2014-09-14: qty 2
  Filled 2014-09-14: qty 1

## 2014-09-14 MED ORDER — HYDROMORPHONE HCL 1 MG/ML IJ SOLN: 0.5000 mg | INTRAMUSCULAR | Status: DC | PRN

## 2014-09-14 MED ORDER — SODIUM CHLORIDE 0.9 % IV SOLN: 250.0000 mL | INTRAVENOUS | Status: DC

## 2014-09-14 MED ORDER — ROSUVASTATIN CALCIUM 5 MG PO TABS
5.0000 mg | ORAL_TABLET | ORAL | Status: DC
  Filled 2014-09-14: qty 1

## 2014-09-14 MED ORDER — SODIUM CHLORIDE 0.9 % IJ SOLN: 3.0000 mL | Freq: Two times a day (BID) | INTRAMUSCULAR | Status: DC

## 2014-09-14 MED ORDER — ASPIRIN EC 81 MG PO TBEC
81.0000 mg | DELAYED_RELEASE_TABLET | Freq: Every day | ORAL | Status: DC
  Filled 2014-09-14: qty 1

## 2014-09-14 MED ORDER — ACETAMINOPHEN 650 MG RE SUPP: 650.0000 mg | RECTAL | Status: DC | PRN

## 2014-09-14 MED ORDER — MENTHOL 3 MG MT LOZG: 1.0000 | LOZENGE | OROMUCOSAL | Status: DC | PRN

## 2014-09-14 MED ORDER — HYDROMORPHONE HCL 1 MG/ML IJ SOLN
0.2500 mg | INTRAMUSCULAR | Status: DC | PRN
  Administered 2014-09-14: 0.5 mg via INTRAVENOUS

## 2014-09-14 MED ORDER — VANCOMYCIN HCL IN DEXTROSE 1-5 GM/200ML-% IV SOLN
INTRAVENOUS | Status: AC
  Filled 2014-09-14: qty 200

## 2014-09-14 MED ORDER — SENNA 8.6 MG PO TABS
1.0000 | ORAL_TABLET | Freq: Two times a day (BID) | ORAL | Status: DC
  Administered 2014-09-14: 8.6 mg via ORAL
  Filled 2014-09-14 (×3): qty 1

## 2014-09-14 MED ORDER — DIAZEPAM 5 MG PO TABS
ORAL_TABLET | ORAL | Status: AC
  Filled 2014-09-14: qty 1

## 2014-09-14 MED ORDER — HYDROMORPHONE HCL 1 MG/ML IJ SOLN
INTRAMUSCULAR | Status: AC
  Administered 2014-09-14: 0.5 mg via INTRAVENOUS
  Filled 2014-09-14: qty 1

## 2014-09-14 MED ORDER — PANTOPRAZOLE SODIUM 40 MG PO TBEC
80.0000 mg | DELAYED_RELEASE_TABLET | Freq: Every day | ORAL | Status: DC
  Administered 2014-09-15: 80 mg via ORAL
  Filled 2014-09-14: qty 2

## 2014-09-14 MED ORDER — PROPOFOL 10 MG/ML IV BOLUS
INTRAVENOUS | Status: DC | PRN
  Administered 2014-09-14: 150 mg via INTRAVENOUS

## 2014-09-14 MED ORDER — GLYCOPYRROLATE 0.2 MG/ML IJ SOLN
INTRAMUSCULAR | Status: DC | PRN
  Administered 2014-09-14: .2 mg via INTRAVENOUS

## 2014-09-14 MED ORDER — PROMETHAZINE HCL 25 MG/ML IJ SOLN: 6.2500 mg | INTRAMUSCULAR | Status: DC | PRN

## 2014-09-14 MED ORDER — LEVOTHYROXINE SODIUM 112 MCG PO TABS
112.0000 ug | ORAL_TABLET | Freq: Every day | ORAL | Status: DC
  Administered 2014-09-15: 112 ug via ORAL
  Filled 2014-09-14 (×2): qty 1

## 2014-09-14 MED ORDER — METAXALONE 800 MG PO TABS
800.0000 mg | ORAL_TABLET | Freq: Two times a day (BID) | ORAL | Status: DC
  Administered 2014-09-14 – 2014-09-15 (×2): 800 mg via ORAL
  Filled 2014-09-14 (×3): qty 1

## 2014-09-14 MED ORDER — ROSUVASTATIN CALCIUM 5 MG PO TABS: 5.0000 mg | ORAL_TABLET | Freq: Every day | ORAL | Status: DC

## 2014-09-14 MED ORDER — LIDOCAINE HCL (CARDIAC) 20 MG/ML IV SOLN
INTRAVENOUS | Status: DC | PRN
  Administered 2014-09-14: 50 mg via INTRAVENOUS

## 2014-09-14 MED ORDER — ALUM & MAG HYDROXIDE-SIMETH 200-200-20 MG/5ML PO SUSP: 30.0000 mL | Freq: Four times a day (QID) | ORAL | Status: DC | PRN

## 2014-09-14 MED ORDER — ONDANSETRON HCL 4 MG/2ML IJ SOLN: 4.0000 mg | INTRAMUSCULAR | Status: DC | PRN

## 2014-09-14 MED ORDER — PHENOL 1.4 % MT LIQD: 1.0000 | OROMUCOSAL | Status: DC | PRN

## 2014-09-14 MED ORDER — SUCCINYLCHOLINE CHLORIDE 20 MG/ML IJ SOLN
INTRAMUSCULAR | Status: DC | PRN
  Administered 2014-09-14: 100 mg via INTRAVENOUS

## 2014-09-14 SURGICAL SUPPLY — 42 items
BANDAGE ADH SHEER 1  50/CT (GAUZE/BANDAGES/DRESSINGS) ×6 IMPLANT
BLADE CLIPPER SURG (BLADE) IMPLANT
BLADE SURG 15 STRL LF DISP TIS (BLADE) ×1 IMPLANT
BLADE SURG 15 STRL SS (BLADE) ×1
CEMENT BONE KYPHX HV R (Orthopedic Implant) ×2 IMPLANT
CEMENT KYPHON C01A KIT/MIXER (Cement) ×2 IMPLANT
CONT SPEC 4OZ CLIKSEAL STRL BL (MISCELLANEOUS) ×2 IMPLANT
DEVICE BIOPSY BONE KYPHX (INSTRUMENTS) ×2 IMPLANT
DRAPE C-ARM 42X72 X-RAY (DRAPES) ×2 IMPLANT
DRAPE INCISE IOBAN 66X45 STRL (DRAPES) ×2 IMPLANT
DRAPE LAPAROTOMY 100X72X124 (DRAPES) ×2 IMPLANT
DRAPE PROXIMA HALF (DRAPES) ×2 IMPLANT
DRSG TELFA 3X8 NADH (GAUZE/BANDAGES/DRESSINGS) IMPLANT
DURAPREP 26ML APPLICATOR (WOUND CARE) ×2 IMPLANT
GAUZE SPONGE 4X4 16PLY XRAY LF (GAUZE/BANDAGES/DRESSINGS) ×2 IMPLANT
GLOVE BIOGEL PI IND STRL 8 (GLOVE) ×1 IMPLANT
GLOVE BIOGEL PI INDICATOR 8 (GLOVE) ×1
GLOVE ECLIPSE 7.5 STRL STRAW (GLOVE) ×6 IMPLANT
GLOVE ECLIPSE 9.0 STRL (GLOVE) ×4 IMPLANT
GLOVE EXAM NITRILE LRG STRL (GLOVE) IMPLANT
GLOVE EXAM NITRILE MD LF STRL (GLOVE) IMPLANT
GLOVE EXAM NITRILE XL STR (GLOVE) IMPLANT
GLOVE EXAM NITRILE XS STR PU (GLOVE) IMPLANT
GOWN STRL REUS W/ TWL LRG LVL3 (GOWN DISPOSABLE) ×1 IMPLANT
GOWN STRL REUS W/ TWL XL LVL3 (GOWN DISPOSABLE) ×1 IMPLANT
GOWN STRL REUS W/TWL 2XL LVL3 (GOWN DISPOSABLE) ×2 IMPLANT
GOWN STRL REUS W/TWL LRG LVL3 (GOWN DISPOSABLE) ×1
GOWN STRL REUS W/TWL XL LVL3 (GOWN DISPOSABLE) ×1
KIT BASIN OR (CUSTOM PROCEDURE TRAY) ×2 IMPLANT
KIT ROOM TURNOVER OR (KITS) ×2 IMPLANT
LIQUID BAND (GAUZE/BANDAGES/DRESSINGS) ×2 IMPLANT
NEEDLE HYPO 25X1 1.5 SAFETY (NEEDLE) ×2 IMPLANT
NS IRRIG 1000ML POUR BTL (IV SOLUTION) ×2 IMPLANT
PACK EENT II TURBAN DRAPE (CUSTOM PROCEDURE TRAY) ×2 IMPLANT
PAD ARMBOARD 7.5X6 YLW CONV (MISCELLANEOUS) ×2 IMPLANT
STAPLER SKIN PROX WIDE 3.9 (STAPLE) ×2 IMPLANT
SUT VIC AB 2-0 CP2 18 (SUTURE) ×2 IMPLANT
SUT VIC AB 3-0 SH 8-18 (SUTURE) ×2 IMPLANT
TOWEL OR 17X24 6PK STRL BLUE (TOWEL DISPOSABLE) ×2 IMPLANT
TOWEL OR 17X26 10 PK STRL BLUE (TOWEL DISPOSABLE) ×2 IMPLANT
TRAY KYPHOPAK 15/3 ONESTEP 1ST (MISCELLANEOUS) IMPLANT
TRAY KYPHOPAK 20/3 ONESTEP 1ST (MISCELLANEOUS) ×2 IMPLANT

## 2014-09-14 NOTE — Anesthesia Postprocedure Evaluation (Signed)
  Anesthesia Post-op Note  Patient: Kimberly Irwin  Procedure(s) Performed: Procedure(s) with comments: Lumbar Two Kyphoplasty/Vertebroplasty (N/A) - Lumbar Two Kyphoplasty/Vertebroplasty  Patient Location: PACU  Anesthesia Type:General  Level of Consciousness: awake and alert   Airway and Oxygen Therapy: Patient Spontanous Breathing  Post-op Pain: none  Post-op Assessment: Post-op Vital signs reviewed, Patient's Cardiovascular Status Stable and Respiratory Function Stable  Post-op Vital Signs: Reviewed  Filed Vitals:   09/14/14 1807  BP: 126/68  Pulse: 64  Temp:   Resp: 16    Complications: No apparent anesthesia complications

## 2014-09-14 NOTE — Progress Notes (Signed)
ANTIBIOTIC CONSULT NOTE - INITIAL  Pharmacy Consult for vancomycin Indication: surgical prophylaxis  Allergies  Allergen Reactions  . Lipitor [Atorvastatin] Swelling  . Metronidazole Swelling  . Penicillins Swelling  . Pregabalin Other (See Comments)    REACTION: Somnolence and dizziness  . Simvastatin Other (See Comments)    Leg pain    Patient Measurements: Height: 5' 4" (162.6 cm) Weight: 181 lb (82.101 kg) IBW/kg (Calculated) : 54.7 Adjusted Body Weight:   Vital Signs: Temp: 98.3 F (36.8 C) (02/11 1930) Temp Source: Oral (02/11 1435) BP: 133/67 mmHg (02/11 1921) Pulse Rate: 64 (02/11 1930) Intake/Output from previous day:   Intake/Output from this shift: Total I/O In: 310 [P.O.:60; I.V.:250] Out: -   Labs:  Recent Labs  09/14/14 1445  WBC 10.9*  HGB 15.2*  PLT 295  CREATININE 0.78   Estimated Creatinine Clearance: 67.9 mL/min (by C-G formula based on Cr of 0.78). No results for input(s): VANCOTROUGH, VANCOPEAK, VANCORANDOM, GENTTROUGH, GENTPEAK, GENTRANDOM, TOBRATROUGH, TOBRAPEAK, TOBRARND, AMIKACINPEAK, AMIKACINTROU, AMIKACIN in the last 72 hours.   Microbiology: Recent Results (from the past 720 hour(s))  Surgical pcr screen     Status: None   Collection Time: 08/24/14  9:01 AM  Result Value Ref Range Status   MRSA, PCR NEGATIVE NEGATIVE Final   Staphylococcus aureus NEGATIVE NEGATIVE Final    Comment:        The Xpert SA Assay (FDA approved for NASAL specimens in patients over 20 years of age), is one component of a comprehensive surveillance program.  Test performance has been validated by [facility] for patients greater than or equal to 60 year old. It is not intended to diagnose infection nor to guide or monitor treatment.     Medical History: Past Medical History  Diagnosis Date  . Sarcoidosis   . Hyperlipidemia   . GERD (gastroesophageal reflux disease)   . Hypertension   . Chronic low back pain   . DDD (degenerative disc  disease)     L3-4 with facet arthropathy and stenosis  . Degenerative spondylolisthesis     L4-5 grade 1 with stenosis  . Acute hip pain 10/26/2012  . Bursitis of left hip 10/26/2012  . Pain in joint, lower leg 10/26/2012    left   . Tremors of nervous system   . Hypothyroidism   . Heart murmur     as an infant  . Pneumonia     as a child several times.   . History of hiatal hernia     Medications:  Scheduled:  . aspirin EC  81 mg Oral Daily  . atenolol  50 mg Oral Daily  . diazepam      . gabapentin  300 mg Oral QHS  . hydrochlorothiazide  25 mg Oral Daily  . [START ON 09/15/2014] levothyroxine  112 mcg Oral QAC breakfast  . metaxalone  800 mg Oral BID  . oxyCODONE      . [START ON 09/15/2014] pantoprazole  80 mg Oral Q1200  . rosuvastatin  5-10 mg Oral Daily  . senna  1 tablet Oral BID  . sodium chloride  3 mL Intravenous Q12H  . vancomycin       Infusions:  . sodium chloride     Assessment: 56 female s/p neurosurgery will be put on vancomycin for surgical prophylaxis.  Per RN, patient doesn't have a drain.  CrCl ~68.  She got a dose of vancomycin 1g at 1600 today  Goal of Therapy:  Vancomycin trough 10-15  Plan:  vancomycin 1g iv x1 at 0400 tomorrow. Pharmacy will sign off  Idabelle Mcpeters, Tsz-Yin 09/14/2014,8:09 PM

## 2014-09-14 NOTE — Brief Op Note (Signed)
09/14/2014  5:10 PM  PATIENT:  Kimberly Irwin  69 y.o. female  PRE-OPERATIVE DIAGNOSIS:  L/2 Compression Fracture  POST-OPERATIVE DIAGNOSIS:  Lumbar Two compression Fracture  PROCEDURE:  Procedure(s) with comments: Lumbar Two Kyphoplasty/Vertebroplasty (N/A) - Lumbar Two Kyphoplasty/Vertebroplasty  SURGEON:  Surgeon(s) and Role:    * Charlie Pitter, MD - Primary  PHYSICIAN ASSISTANT:   ASSISTANTS:    ANESTHESIA:   general  EBL:     BLOOD ADMINISTERED:none  DRAINS: none   LOCAL MEDICATIONS USED:  MARCAINE     SPECIMEN:  No Specimen  DISPOSITION OF SPECIMEN:  N/A  COUNTS:  YES  TOURNIQUET:  * No tourniquets in log *  DICTATION: .Dragon Dictation  PLAN OF CARE: Admit for overnight observation  PATIENT DISPOSITION:  PACU - hemodynamically stable.   Delay start of Pharmacological VTE agent (>24hrs) due to surgical blood loss or risk of bleeding: yes

## 2014-09-14 NOTE — Op Note (Signed)
Date of procedure: 09/14/2014  Date of dictation: Same  Service: Neurosurgery  Preoperative diagnosis: L2 osteoporotic compression fracture   Postoperative diagnosis: Same  Procedure Name: Percutaneous L2 kyphoplasty  Surgeon:Isadore Bokhari A.Angeligue Bowne, M.D.  Asst. Surgeon: None  Anesthesia: General  Indication: 69 year old female recently status post L2-3 anterior lateral interbody decompression and fusion. Patient presents with acute back pain. Workup demonstrates evidence of an acute compression deformity of L2 consistent with a benign osteoporotic compression fracture. Patient presents now for kyphoplasty  Operative note: After induction of anesthesia, patient positioned prone on 2 bolsters an appropriate padded lumbar region prepped and draped. Stab incisions made overlying pedicles of L2 bilaterally. Using AP and lateral fluoroscopic guidance entry into the pedicles and vertebral bodies of L2 was performed using jam Sheedy needle introducers. Inner cannula was removed. Kyphoplasty balloon was expanded bilaterally improving the compression deformity of the superior aspect of L2. Kyphoplasty balloons removed. Methylmethacrylate was then injected into the vertebral body of L2 under AP and lateral fluoroscopic guidance. A total of 8 mL of methylmethacrylate was instilled throughout the vertebral body of L2. There was no evidence of any significant extravasation. Good overall appearance of the vertebral body was achieved with final images. Introducers were removed. No apparent complications. Wounds closed in a typical fashion and sterile dressings applied.

## 2014-09-14 NOTE — H&P (Signed)
  Interim note:  Patient approximately 2 weeks status post left-sided L2-3 anterior lateral interbody decompression and fusion with interbody cage and lateral plate instrumentation. Initially she did very well with great relief of her pain. Wall ambulating around her home she suffered the acute onset of severe back pain. Workup demonstrates evidence of a compression fracture of L2 without any retropulsion into the spinal canal. Patient presents today for kyphoplasty vertebroplasty  Physical examination stable from admission H&P area and  Impression: subacute L2 compression fracture with severe pain. Plan percutaneous L2 kyphoplasty/vertebroplasty. Risks and benefits of been explained. Patient wishes to proceed.

## 2014-09-14 NOTE — Anesthesia Preprocedure Evaluation (Addendum)
Anesthesia Evaluation  Patient identified by MRN, date of birth, ID band Patient awake    Reviewed: Allergy & Precautions, H&P , NPO status , Patient's Chart, lab work & pertinent test results, reviewed documented beta blocker date and time   Airway Mallampati: II  TM Distance: >3 FB Neck ROM: Full    Dental  (+) Dental Advisory Given, Edentulous Upper, Partial Lower   Pulmonary former smoker,  breath sounds clear to auscultation        Cardiovascular hypertension, Pt. on medications and Pt. on home beta blockers + Valvular Problems/Murmurs Rhythm:Regular Rate:Normal     Neuro/Psych PSYCHIATRIC DISORDERS Depression negative neurological ROS     GI/Hepatic Neg liver ROS, hiatal hernia, GERD-  Medicated,  Endo/Other  Hypothyroidism   Renal/GU negative Renal ROS     Musculoskeletal  (+) Arthritis -, Osteoarthritis,    Abdominal   Peds  Hematology negative hematology ROS (+)   Anesthesia Other Findings   Reproductive/Obstetrics negative OB ROS                            Anesthesia Physical  Anesthesia Plan  ASA: III  Anesthesia Plan: General   Post-op Pain Management:    Induction: Intravenous  Airway Management Planned: Oral ETT  Additional Equipment: None  Intra-op Plan:   Post-operative Plan: Extubation in OR  Informed Consent: I have reviewed the patients History and Physical, chart, labs and discussed the procedure including the risks, benefits and alternatives for the proposed anesthesia with the patient or authorized representative who has indicated his/her understanding and acceptance.   Dental advisory given  Plan Discussed with: CRNA  Anesthesia Plan Comments:         Anesthesia Quick Evaluation

## 2014-09-14 NOTE — Anesthesia Procedure Notes (Signed)
Procedure Name: Intubation Date/Time: 09/14/2014 4:17 PM Performed by: Eligha Bridegroom Pre-anesthesia Checklist: Patient identified, Timeout performed, Emergency Drugs available and Suction available Patient Re-evaluated:Patient Re-evaluated prior to inductionOxygen Delivery Method: Circle system utilized Preoxygenation: Pre-oxygenation with 100% oxygen Intubation Type: IV induction Ventilation: Mask ventilation without difficulty Laryngoscope Size: Mac and 4 Grade View: Grade I Tube type: Oral Tube size: 7.5 mm Number of attempts: 1 Placement Confirmation: ETT inserted through vocal cords under direct vision,  breath sounds checked- equal and bilateral and positive ETCO2 Secured at: 21 cm Tube secured with: Tape Dental Injury: Teeth and Oropharynx as per pre-operative assessment

## 2014-09-15 ENCOUNTER — Encounter (HOSPITAL_COMMUNITY): Payer: Self-pay | Admitting: Neurosurgery

## 2014-09-15 DIAGNOSIS — I10 Essential (primary) hypertension: Secondary | ICD-10-CM | POA: Diagnosis not present

## 2014-09-15 DIAGNOSIS — M5136 Other intervertebral disc degeneration, lumbar region: Secondary | ICD-10-CM | POA: Diagnosis not present

## 2014-09-15 DIAGNOSIS — M4856XA Collapsed vertebra, not elsewhere classified, lumbar region, initial encounter for fracture: Secondary | ICD-10-CM | POA: Diagnosis not present

## 2014-09-15 DIAGNOSIS — E785 Hyperlipidemia, unspecified: Secondary | ICD-10-CM | POA: Diagnosis not present

## 2014-09-15 DIAGNOSIS — D869 Sarcoidosis, unspecified: Secondary | ICD-10-CM | POA: Diagnosis not present

## 2014-09-15 DIAGNOSIS — K219 Gastro-esophageal reflux disease without esophagitis: Secondary | ICD-10-CM | POA: Diagnosis not present

## 2014-09-15 MED ORDER — OXYCODONE-ACETAMINOPHEN 5-325 MG PO TABS: 1.0000 | ORAL_TABLET | ORAL | Status: DC | PRN

## 2014-09-15 NOTE — Progress Notes (Signed)
Orthopedic Tech Progress Note Patient Details:  Kimberly Irwin 04/25/1946 [id number]  Patient ID: [name], female   DOB: 1946-06-25, 69 y.o.   MRN: [id number] Called in bio-tech brace order; spoke with Raul Del, [name] 09/15/2014, 9:15 AM

## 2014-09-15 NOTE — Discharge Summary (Signed)
Physician Discharge Summary  Patient ID: Kimberly Irwin MRN: [id number] DOB/AGE: 71-03-1946 71 y.o.  Admit date: 09/14/2014 Discharge date: 09/15/2014  Admission Diagnoses:  Discharge Diagnoses:  Active Problems:   Lumbar compression fracture   Discharged Condition: good  Hospital Course: The patient was admitted to the hospital for treatment of an osteoporotic L2 compression fracture. Patient underwent percutaneous kyphoplasty at L2 with good results. She is mobilizing without difficulty. Her pain level is much improved. She is ready for discharge home.  Consults:   Significant Diagnostic Studies:   Treatments:   Discharge Exam: Blood pressure 122/74, pulse 74, temperature 98 F (36.7 C), temperature source Oral, resp. rate 16, height _0  (1.626 m), weight 82.101 kg (181 lb), SpO2 95 %. Awake and alert. Oriented and appropriate. Motor and sensory function intact. Wound clean and dry. Chest and abdomen benign.  Disposition: 01-Home or Self Care     Medication List    TAKE these medications        aspirin EC 81 MG tablet  Take 81 mg by mouth daily.     atenolol 50 MG tablet  Commonly known as:  TENORMIN  Take 1 tablet (50 mg total) by mouth daily.     CALCIUM 1200 PO  Take 1,200 mg by mouth daily.     Cinnamon 500 MG capsule  Take 2,000 mg by mouth daily.     Coenzyme Q10 200 MG capsule  Take 200 mg by mouth daily.     cyclobenzaprine 10 MG tablet  Commonly known as:  FLEXERIL  Take 1 tablet (10 mg total) by mouth 3 (three) times daily as needed for muscle spasms.     esomeprazole 40 MG capsule  Commonly known as:  NEXIUM  Take 40 mg by mouth daily at 12 noon.     Fish Oil 1200 MG Caps  Take 2,400 mg by mouth daily.     gabapentin 300 MG capsule  Commonly known as:  NEURONTIN  Take 1 capsule (300 mg total) by mouth 2 (two) times daily as needed.     hydrochlorothiazide 25 MG tablet  Commonly known as:  HYDRODIURIL  TAKE 1 TABLET (25 MG TOTAL) BY  MOUTH DAILY.     levothyroxine 112 MCG tablet  Commonly known as:  SYNTHROID, LEVOTHROID  Take 1 tablet (112 mcg total) by mouth daily before breakfast.     metaxalone 800 MG tablet  Commonly known as:  SKELAXIN  Take 800 mg by mouth 2 (two) times daily.     oxyCODONE-acetaminophen 5-325 MG per tablet  Commonly known as:  PERCOCET/ROXICET  Take 1-2 tablets by mouth every 4 (four) hours as needed for moderate pain.     rosuvastatin 10 MG tablet  Commonly known as:  CRESTOR  Take 1 tablet (10 mg total) by mouth daily. Take 1 tablet on Saturday and 1/2 tab daily     Sodium Chloride-Sodium Bicarb 2300-700 MG Kit  Place 1 each into the nose daily.     traMADol 50 MG tablet  Commonly known as:  ULTRAM  Take 2 tablets (100 mg total) by mouth every 6 (six) hours as needed for moderate pain or severe pain.     Vitamin D 2000 UNITS tablet  Take 2,000 Units by mouth daily.           Follow-up Information    Follow up with Kimberly Irwin A, MD. Schedule an appointment as soon as possible for a visit in 2 weeks.   Specialty:  Neurosurgery   Contact information:   [address] [place] [address] [phone]       Signed: Charlie Pitter 09/15/2014, 11:25 AM

## 2014-09-15 NOTE — Progress Notes (Signed)
Patient d/c to home with family. IV removed.  Prescription given, MD reviewed instructions verbally with patient, not placed in d/c written instructions.  Patient ok with this.

## 2014-09-15 NOTE — Transfer of Care (Signed)
Immediate Anesthesia Transfer of Care Note  Patient: Kimberly Irwin  Procedure(s) Performed: Procedure(s) with comments: Lumbar Two Kyphoplasty/Vertebroplasty (N/A) - Lumbar Two Kyphoplasty/Vertebroplasty  Patient Location: PACU  Anesthesia Type:General  Level of Consciousness: awake, alert  and oriented  Airway & Oxygen Therapy: Patient Spontanous Breathing and Patient connected to nasal cannula oxygen  Post-op Assessment: Report given to RN and Post -op Vital signs reviewed and stable  Post vital signs: Reviewed and stable  Last Vitals:  Filed Vitals:   09/15/14 0956  BP: 122/74  Pulse: 74  Temp:   Resp:     Complications: No apparent anesthesia complications

## 2014-09-15 NOTE — Progress Notes (Signed)
UR completed.

## 2014-09-17 ENCOUNTER — Other Ambulatory Visit: Payer: Self-pay | Admitting: Family Medicine

## 2014-09-20 ENCOUNTER — Telehealth: Payer: Self-pay

## 2014-09-20 NOTE — Telephone Encounter (Signed)
Pt states she is unable to ride due to recent surgery.  She has an appointment with surgeon on 09/27/14.  After that, she will schedule an appointment to follow up with Dr. Charlett Blake.  Afebrile.  Pain well-controlled.  Pt is getting up and moving about within her home.  Today was her first day walking without a walker.  BM regular on colace.  She does not have any needs at this time.

## 2014-10-09 ENCOUNTER — Encounter (INDEPENDENT_AMBULATORY_CARE_PROVIDER_SITE_OTHER): Payer: Medicare Other | Admitting: Ophthalmology

## 2014-10-09 DIAGNOSIS — H35033 Hypertensive retinopathy, bilateral: Secondary | ICD-10-CM

## 2014-10-09 DIAGNOSIS — H34832 Tributary (branch) retinal vein occlusion, left eye: Secondary | ICD-10-CM | POA: Diagnosis not present

## 2014-10-09 DIAGNOSIS — H43813 Vitreous degeneration, bilateral: Secondary | ICD-10-CM | POA: Diagnosis not present

## 2014-10-09 DIAGNOSIS — I10 Essential (primary) hypertension: Secondary | ICD-10-CM

## 2014-10-10 DIAGNOSIS — M4806 Spinal stenosis, lumbar region: Secondary | ICD-10-CM | POA: Diagnosis not present

## 2014-10-10 DIAGNOSIS — Z6835 Body mass index (BMI) 35.0-35.9, adult: Secondary | ICD-10-CM | POA: Diagnosis not present

## 2014-10-19 ENCOUNTER — Other Ambulatory Visit: Payer: Self-pay | Admitting: Family Medicine

## 2014-10-24 DIAGNOSIS — H3532 Exudative age-related macular degeneration: Secondary | ICD-10-CM | POA: Diagnosis not present

## 2014-10-24 DIAGNOSIS — H26493 Other secondary cataract, bilateral: Secondary | ICD-10-CM | POA: Diagnosis not present

## 2014-10-24 DIAGNOSIS — Z961 Presence of intraocular lens: Secondary | ICD-10-CM | POA: Diagnosis not present

## 2014-10-31 DIAGNOSIS — H26492 Other secondary cataract, left eye: Secondary | ICD-10-CM | POA: Diagnosis not present

## 2014-11-08 DIAGNOSIS — M4806 Spinal stenosis, lumbar region: Secondary | ICD-10-CM | POA: Diagnosis not present

## 2014-12-04 ENCOUNTER — Ambulatory Visit (INDEPENDENT_AMBULATORY_CARE_PROVIDER_SITE_OTHER): Payer: Medicare Other | Admitting: Family Medicine

## 2014-12-04 ENCOUNTER — Encounter: Payer: Self-pay | Admitting: Family Medicine

## 2014-12-04 VITALS — BP 136/82 | HR 83 | Temp 98.0°F | Resp 16 | Wt 182.4 lb

## 2014-12-04 DIAGNOSIS — R799 Abnormal finding of blood chemistry, unspecified: Secondary | ICD-10-CM | POA: Diagnosis not present

## 2014-12-04 DIAGNOSIS — E785 Hyperlipidemia, unspecified: Secondary | ICD-10-CM | POA: Diagnosis not present

## 2014-12-04 DIAGNOSIS — R739 Hyperglycemia, unspecified: Secondary | ICD-10-CM

## 2014-12-04 DIAGNOSIS — K219 Gastro-esophageal reflux disease without esophagitis: Secondary | ICD-10-CM

## 2014-12-04 DIAGNOSIS — E038 Other specified hypothyroidism: Secondary | ICD-10-CM | POA: Diagnosis not present

## 2014-12-04 DIAGNOSIS — M5442 Lumbago with sciatica, left side: Secondary | ICD-10-CM

## 2014-12-04 DIAGNOSIS — R52 Pain, unspecified: Secondary | ICD-10-CM

## 2014-12-04 DIAGNOSIS — M519 Unspecified thoracic, thoracolumbar and lumbosacral intervertebral disc disorder: Secondary | ICD-10-CM

## 2014-12-04 DIAGNOSIS — R251 Tremor, unspecified: Secondary | ICD-10-CM

## 2014-12-04 DIAGNOSIS — M5441 Lumbago with sciatica, right side: Secondary | ICD-10-CM

## 2014-12-04 DIAGNOSIS — I10 Essential (primary) hypertension: Secondary | ICD-10-CM

## 2014-12-04 DIAGNOSIS — E669 Obesity, unspecified: Secondary | ICD-10-CM

## 2014-12-04 LAB — LIPID PANEL
Cholesterol: 191 mg/dL (ref 0–200)
HDL: 39.5 mg/dL (ref >39.00)
NONHDL: 151.5
Total CHOL/HDL Ratio: 5
Triglycerides: 211 mg/dL — ABNORMAL HIGH (ref 0.0–149.0)
VLDL: 42.2 mg/dL — ABNORMAL HIGH (ref 0.0–40.0)

## 2014-12-04 LAB — TSH: TSH: 1.67 u[IU]/mL (ref 0.35–4.50)

## 2014-12-04 LAB — COMPREHENSIVE METABOLIC PANEL
ALBUMIN: 4 g/dL (ref 3.5–5.2)
ALT: 32 U/L (ref 0–35)
AST: 31 U/L (ref 0–37)
Alkaline Phosphatase: 61 U/L (ref 39–117)
BUN: 11 mg/dL (ref 6–23)
CO2: 27 mEq/L (ref 19–32)
Calcium: 10.1 mg/dL (ref 8.4–10.5)
Chloride: 101 mEq/L (ref 96–112)
Creatinine, Ser: 0.89 mg/dL (ref 0.40–1.20)
GFR: 66.52 mL/min (ref >60.00)
GLUCOSE: 107 mg/dL — AB (ref 70–99)
POTASSIUM: 4.1 meq/L (ref 3.5–5.1)
SODIUM: 139 meq/L (ref 135–145)
TOTAL PROTEIN: 7.7 g/dL (ref 6.0–8.3)
Total Bilirubin: 0.7 mg/dL (ref 0.2–1.2)

## 2014-12-04 LAB — CBC
HCT: 44.3 % (ref 36.0–46.0)
HEMOGLOBIN: 14.9 g/dL (ref 12.0–15.0)
MCHC: 33.7 g/dL (ref 30.0–36.0)
MCV: 89.7 fl (ref 78.0–100.0)
PLATELETS: 226 10*3/uL (ref 150.0–400.0)
RBC: 4.94 Mil/uL (ref 3.87–5.11)
RDW: 14.1 % (ref 11.5–15.5)
WBC: 10.5 10*3/uL (ref 4.0–10.5)

## 2014-12-04 LAB — HEMOGLOBIN A1C: Hgb A1c MFr Bld: 6 % (ref 4.6–6.5)

## 2014-12-04 LAB — LDL CHOLESTEROL, DIRECT: Direct LDL: 118 mg/dL

## 2014-12-04 MED ORDER — LEVOTHYROXINE SODIUM 112 MCG PO TABS: 112.0000 ug | ORAL_TABLET | Freq: Every day | ORAL | Status: DC

## 2014-12-04 MED ORDER — ATENOLOL 50 MG PO TABS: 50.0000 mg | ORAL_TABLET | Freq: Every day | ORAL | Status: DC

## 2014-12-04 MED ORDER — TRAMADOL HCL 50 MG PO TABS: 100.0000 mg | ORAL_TABLET | Freq: Four times a day (QID) | ORAL | Status: DC | PRN

## 2014-12-04 MED ORDER — HYDROCHLOROTHIAZIDE 25 MG PO TABS: ORAL_TABLET | ORAL | Status: DC

## 2014-12-04 NOTE — Progress Notes (Signed)
Pre visit review using our clinic review tool, if applicable. No additional management support is needed unless otherwise documented below in the visit note.

## 2014-12-04 NOTE — Assessment & Plan Note (Signed)
hgba1c acceptable, minimize simple carbs. Increase exercise as tolerated. Repeat hgba1c

## 2014-12-04 NOTE — Assessment & Plan Note (Signed)
Encouraged DASH diet, decrease po intake and increase exercise as tolerated. Needs 7-8 hours of sleep nightly. Avoid trans fats, eat small, frequent meals every 4-5 hours with lean proteins, complex carbs and healthy fats. Minimize simple carbs, GMO foods. 

## 2014-12-04 NOTE — Assessment & Plan Note (Signed)
Well controlled, no changes to meds. Encouraged heart healthy diet such as the DASH diet and exercise as tolerated.

## 2014-12-04 NOTE — Patient Instructions (Signed)

## 2014-12-04 NOTE — Assessment & Plan Note (Signed)
On Levothyroxine, continue to monitor 

## 2014-12-04 NOTE — Assessment & Plan Note (Signed)
Tolerating statin, encouraged heart healthy diet, avoid trans fats, minimize simple carbs and saturated fats. Increase exercise as tolerated 

## 2014-12-06 DIAGNOSIS — Z6834 Body mass index (BMI) 34.0-34.9, adult: Secondary | ICD-10-CM | POA: Diagnosis not present

## 2014-12-06 DIAGNOSIS — M4806 Spinal stenosis, lumbar region: Secondary | ICD-10-CM | POA: Diagnosis not present

## 2014-12-10 NOTE — Assessment & Plan Note (Signed)
Encouraged moist heat and gentle stretching as tolerated. May try NSAIDs and prescription meds as directed and report if symptoms worsen or seek immediate care 

## 2014-12-10 NOTE — Progress Notes (Signed)
Kimberly Irwin  [id number] 12-10-1945 12/10/2014      Progress Note-Follow Up  Subjective  Chief Complaint  Chief Complaint  Patient presents with  . Follow-up    HPI  Patient is a 69 y.o. female in today for routine medical care. Patient in today for follow-up. Has been struggling with back pain. He did have fusion back in January but had to have a repeat procedure done in February. Is presently slowly healing. Denies any radicular symptoms or incontinence. No recent illness or fevers. Denies CP/palp/SOB/HA/congestion/fevers/GI or GU c/o. Taking meds as prescribed  Past Medical History  Diagnosis Date  . Sarcoidosis   . Hyperlipidemia   . GERD (gastroesophageal reflux disease)   . Hypertension   . Chronic low back pain   . DDD (degenerative disc disease)     L3-4 with facet arthropathy and stenosis  . Degenerative spondylolisthesis     L4-5 grade 1 with stenosis  . Acute hip pain 10/26/2012  . Bursitis of left hip 10/26/2012  . Pain in joint, lower leg 10/26/2012    left   . Tremors of nervous system   . Hypothyroidism   . Heart murmur     as an infant  . Pneumonia     as a child several times.   . History of hiatal hernia     Past Surgical History  Procedure Laterality Date  . Carpal tunnel release Bilateral   . Thyroidectomy    . Back surgery  01/15/09    L3-4 and L4-5 decompressive laminectomy with bilateral L3, L4, and L5 decompressive foraminotomies, more than it would be required for simple interbody fusion alone.  . Back surgery  01/15/09    L3-4 and L4-5 posterior lumbar interbody fusion utilizing tanget interbody allograft wedge, Telamon interbody PEEk cage, and local autografting.  . Back surgery  01/15/09    L3, L4, and L5 posterolateral arthrodesis using segmental pedicle screw fixation and local autografting.  . Joint replacement  09/13/09    Right Hip, Dr. Alvan Dame  . Total hip arthroplasty Left 06/28/2013    Procedure: LEFT TOTAL  HIP ARTHROPLASTY ANTERIOR  APPROACH;  Surgeon: Mauri Pole, MD;  Location: [facility];  Service: Orthopedics;  Laterality: Left;  Marland Kitchen Eye surgery      lens implant  . Colonoscopy    . Esophagogastroduodenoscopy    . Cardiac catheterization    . Anterior lat lumbar fusion Left 08/29/2014    Procedure: EXTREME LEFT LATERAL INTERBODY FUSION LUMBAR TWO-THREE LATERAL PLATE;  Surgeon: Charlie Pitter, MD;  Location: [facility];  Service: Neurosurgery;  Laterality: Left;  Marland Kitchen Kyphoplasty N/A 09/14/2014    Procedure: Lumbar Two Kyphoplasty/Vertebroplasty;  Surgeon: Charlie Pitter, MD;  Location: [facility];  Service: Neurosurgery;  Laterality: N/A;  Lumbar Two Kyphoplasty/Vertebroplasty    Family History  Problem Relation Age of Onset  . Cancer Mother 4    Breast  . Heart disease Mother   . Hypertension Mother   . Hyperlipidemia Mother   . Heart attack Mother   . Asthma Maternal Grandmother     History   Social History  . Marital Status: Widowed    Spouse Name: N/A  . Number of Children: 2  . Years of Education: N/A   Occupational History  . Not on file.   Social History Main Topics  . Smoking status: Former Smoker -- 0.50 packs/day for 32 years    Types: Cigarettes    Quit date: 08/04/1993  .  Smokeless tobacco: Never Used  . Alcohol Use: No  . Drug Use: No  . Sexual Activity: Not on file   Other Topics Concern  . Not on file   Social History Narrative   Married 91 years and has 2 children (2 daughters)   Alcohol Use - no   Former Smoker - she quit 10 years ago, started when she was 27 and has had varying level of use of tobacco products from 1/2 pack to 3 packs per day.    Current Outpatient Prescriptions on File Prior to Visit  Medication Sig Dispense Refill  . aspirin EC 81 MG tablet Take 81 mg by mouth daily.    . Calcium Carbonate-Vit D-Min (CALCIUM 1200 PO) Take 1,200 mg by mouth daily.     . Cholecalciferol (VITAMIN D) 2000 UNITS tablet Take 2,000 Units by mouth daily.    . Cinnamon 500 MG  capsule Take 2,000 mg by mouth daily.      . Coenzyme Q10 200 MG capsule Take 200 mg by mouth daily.    . CRESTOR 10 MG tablet TAKE 1 TABLET (10 MG TOTAL) BY MOUTH DAILY. TAKE 1 TABLET ON SATURDAY AND 1/2 TAB DAILY 30 tablet 6  . cyclobenzaprine (FLEXERIL) 10 MG tablet Take 1 tablet (10 mg total) by mouth 3 (three) times daily as needed for muscle spasms. 30 tablet 0  . esomeprazole (NEXIUM) 40 MG capsule Take 40 mg by mouth daily at 12 noon.    . gabapentin (NEURONTIN) 300 MG capsule Take 1 capsule (300 mg total) by mouth 2 (two) times daily as needed. (Patient taking differently: Take 300 mg by mouth at bedtime. ) 1809 capsule 2  . metaxalone (SKELAXIN) 800 MG tablet Take 800 mg by mouth 2 (two) times daily.    . Omega-3 Fatty Acids (FISH OIL) 1200 MG CAPS Take 2,400 mg by mouth daily.     Marland Kitchen oxyCODONE-acetaminophen (PERCOCET/ROXICET) 5-325 MG per tablet Take 1-2 tablets by mouth every 4 (four) hours as needed for moderate pain. (Patient not taking: Reported on 12/04/2014) 90 tablet 0  . Sodium Chloride-Sodium Bicarb 2300-700 MG KIT Place 1 each into the nose daily.     No current facility-administered medications on file prior to visit.    Allergies  Allergen Reactions  . Lipitor [Atorvastatin] Swelling  . Metronidazole Swelling  . Penicillins Swelling  . Pregabalin Other (See Comments)    REACTION: Somnolence and dizziness  . Simvastatin Other (See Comments)    Leg pain    Review of Systems  Review of Systems  Constitutional: Negative for fever and malaise/fatigue.  HENT: Negative for congestion.   Eyes: Negative for discharge.  Respiratory: Negative for shortness of breath.   Cardiovascular: Negative for chest pain, palpitations and leg swelling.  Gastrointestinal: Negative for nausea, abdominal pain and diarrhea.  Genitourinary: Negative for dysuria.  Musculoskeletal: Positive for back pain. Negative for falls.  Skin: Negative for rash.  Neurological: Negative for loss of  consciousness and headaches.  Endo/Heme/Allergies: Negative for polydipsia.  Psychiatric/Behavioral: Negative for depression and suicidal ideas. The patient is not nervous/anxious and does not have insomnia.     Objective  BP 136/82 mmHg  Pulse 83  Temp(Src) 98 F (36.7 C) (Oral)  Resp 16  Wt 182 lb 6.4 oz (82.736 kg)  SpO2 97%  Physical Exam  Physical Exam  Constitutional: She is oriented to person, place, and time and well-developed, well-nourished, and in no distress. No distress.  HENT:  Head: Normocephalic and  atraumatic.  Eyes: Conjunctivae are normal.  Neck: Neck supple. No thyromegaly present.  Cardiovascular: Normal rate, regular rhythm and normal heart sounds.   No murmur heard. Pulmonary/Chest: Effort normal and breath sounds normal. She has no wheezes.  Abdominal: She exhibits no distension and no mass.  Musculoskeletal: She exhibits no edema.  Lymphadenopathy:    She has no cervical adenopathy.  Neurological: She is alert and oriented to person, place, and time.  Skin: Skin is warm and dry. No rash noted. She is not diaphoretic.  Psychiatric: Memory, affect and judgment normal.    Lab Results  Component Value Date   TSH 1.67 12/04/2014   Lab Results  Component Value Date   WBC 10.5 12/04/2014   HGB 14.9 12/04/2014   HCT 44.3 12/04/2014   MCV 89.7 12/04/2014   PLT 226.0 12/04/2014   Lab Results  Component Value Date   CREATININE 0.89 12/04/2014   BUN 11 12/04/2014   NA 139 12/04/2014   K 4.1 12/04/2014   CL 101 12/04/2014   CO2 27 12/04/2014   Lab Results  Component Value Date   ALT 32 12/04/2014   AST 31 12/04/2014   ALKPHOS 61 12/04/2014   BILITOT 0.7 12/04/2014   Lab Results  Component Value Date   CHOL 191 12/04/2014   Lab Results  Component Value Date   HDL 39.50 12/04/2014   Lab Results  Component Value Date   LDLCALC 96 08/28/2014   Lab Results  Component Value Date   TRIG 211.0* 12/04/2014   Lab Results  Component  Value Date   CHOLHDL 5 12/04/2014     Assessment & Plan  Essential hypertension Well controlled, no changes to meds. Encouraged heart healthy diet such as the DASH diet and exercise as tolerated.    Hypothyroidism On Levothyroxine, continue to monitor   Obesity Encouraged DASH diet, decrease po intake and increase exercise as tolerated. Needs 7-8 hours of sleep nightly. Avoid trans fats, eat small, frequent meals every 4-5 hours with lean proteins, complex carbs and healthy fats. Minimize simple carbs, GMO foods.   Hyperglycemia hgba1c acceptable, minimize simple carbs. Increase exercise as tolerated. Repeat hgba1c   Hyperlipidemia Tolerating statin, encouraged heart healthy diet, avoid trans fats, minimize simple carbs and saturated fats. Increase exercise as tolerated   GERD Avoid offending foods, start probiotics. Do not eat large meals in late evening and consider raising head of bed.    Lumbar disc disease Encouraged moist heat and gentle stretching as tolerated. May try NSAIDs and prescription meds as directed and report if symptoms worsen or seek immediate care

## 2014-12-10 NOTE — Assessment & Plan Note (Signed)
Avoid offending foods, start probiotics. Do not eat large meals in late evening and consider raising head of bed.  

## 2014-12-18 ENCOUNTER — Encounter (INDEPENDENT_AMBULATORY_CARE_PROVIDER_SITE_OTHER): Payer: Medicare Other | Admitting: Ophthalmology

## 2014-12-18 DIAGNOSIS — H35033 Hypertensive retinopathy, bilateral: Secondary | ICD-10-CM | POA: Diagnosis not present

## 2014-12-18 DIAGNOSIS — I10 Essential (primary) hypertension: Secondary | ICD-10-CM

## 2014-12-18 DIAGNOSIS — H34832 Tributary (branch) retinal vein occlusion, left eye: Secondary | ICD-10-CM | POA: Diagnosis not present

## 2014-12-18 DIAGNOSIS — H43813 Vitreous degeneration, bilateral: Secondary | ICD-10-CM

## 2014-12-28 ENCOUNTER — Ambulatory Visit (INDEPENDENT_AMBULATORY_CARE_PROVIDER_SITE_OTHER): Payer: Medicare Other | Admitting: Medical

## 2014-12-28 ENCOUNTER — Encounter: Payer: Self-pay | Admitting: Medical

## 2014-12-28 VITALS — BP 151/90 | HR 72 | Temp 98.0°F | Wt 181.0 lb

## 2014-12-28 DIAGNOSIS — L089 Local infection of the skin and subcutaneous tissue, unspecified: Secondary | ICD-10-CM | POA: Diagnosis not present

## 2014-12-28 MED ORDER — DOXYCYCLINE HYCLATE 100 MG PO TABS: 100.0000 mg | ORAL_TABLET | Freq: Two times a day (BID) | ORAL | Status: DC

## 2014-12-28 NOTE — Patient Instructions (Addendum)
Skin infection You do have some faint redness over area area that did  hurt and was swollen so will rx antibiotic. Doxycycline. Please make sure you eat some before taking.     You can use ibuprofen low dose 200 mg 3 time a day for 2 days since some of pain may be muscle related.  If symptoms worsen over long holiday weekend then ED evaluation.  Follow up in 1 wk or as needed. I want to recheck area. If concern for parotid gland issue at that time will refer you to ent.  Would recommend lozenge to promote salivation.

## 2014-12-28 NOTE — Progress Notes (Signed)
Pre-visit review using our clinic review tool, if applicable. No additional management support is needed unless otherwise documented below in the visit note.

## 2014-12-28 NOTE — Assessment & Plan Note (Addendum)
You do have some faint redness over area area that did  hurt and was swollen so will rx antibiotic. Doxycycline. Please make sure you eat some before taking.

## 2014-12-28 NOTE — Progress Notes (Signed)
   Subjective:    Patient ID: Kimberly Irwin, female    DOB: 08/01/1946, 69 y.o.   MRN: [id number]  HPI   Pt states that yesterday she noticed swelling  in front of her left ear and some on left side upper neck was swollen. She has some pain on moving. Minimal faint ha. But not localizing ha to any particular area. Pt placed some warm compresses on area. Today less swelling and less pain. No fever, no chills or sweats. Faint mild st on Monday but none since. No teeth pain. False teeth on top. She thought maybe mild ear ache. No nausea or vomiting. No gross motor or sensory function deficts reported.  Overall she is feeling a lot better today compared to yesterday.    Review of Systems  Constitutional: Negative for fever, chills and fatigue.  HENT: Positive for sore throat. Negative for congestion, ear discharge, ear pain, mouth sores, nosebleeds, postnasal drip, rhinorrhea, sinus pressure, sneezing and tinnitus.        Fiant st on Monday only.  Gastrointestinal: Negative.   Musculoskeletal: Negative for back pain.  Skin:       Faint red over area that was previously described as swollen.  Neurological: Positive for headaches. Negative for dizziness, syncope, facial asymmetry, weakness and numbness.       Minimal faint ha.  Hematological: Negative for adenopathy. Does not bruise/bleed easily.       Objective:   Physical Exam  General  Mental Status - Alert. General Appearance - Well groomed. Not in acute distress.  Skin Rashes- No Rashes.  HEENT Head- Normal. Ear Auditory Canal - Left- Normal. Right - Normal.Tympanic Membrane- Left- faint dull appearance.. Right- Normal. Eye Sclera/Conjunctiva- Left- Normal. Right- Normal. Nose & Sinuses Nasal Mucosa- Left-  Not oggy or Congested. Right-  Not  boggy or Congested. Mouth & Throat Lips: Upper Lip- Normal: no dryness, cracking, pallor, cyanosis, or vesicular eruption. Lower Lip-Normal: no dryness, cracking, pallor, cyanosis or  vesicular eruption. Buccal Mucosa- Bilateral- No Aphthous ulcers. Oropharynx- No Discharge or Erythema. Tonsils: Characteristics- Bilateral- No Erythema or Congestion. Size/Enlargement- Bilateral- No enlargement. Discharge- bilateral-None. No parotid gland swelling either side.   Neck Neck- Supple. No Masses. No lymphadnopahy. No carotid bruits lt trapezius is faint tender to palpation.   Chest and Lung Exam Auscultation: Breath Sounds:- even and unlabored  Cardiovascular Auscultation:Rythm- Regular, rate and rhythm. Murmurs & Other Heart Sounds:Ausculatation of the heart reveal- No Murmurs.  Lymphatic Head & Neck General Head & Neck Lymphatics: Bilateral: Description- No Localized lymphadenopathy.  Skin- faint  Pinkish/red area extending in front of left ear down to proximal 1/3 portion of sternocleidmoastoid. No induration and no fluctuance. Not tender. Faint warmth.(note lt temporal looked normal)   Neurologic Cranial Nerve exam:- CN III-XII intact(No nystagmus), symmetric smile. Strength:- 5/5 equal and symmetric strength both upper and lower extremities.       Assessment & Plan:  Differential dx discussed with pt and improtance of follow up explained to pt.

## 2015-01-04 ENCOUNTER — Ambulatory Visit (INDEPENDENT_AMBULATORY_CARE_PROVIDER_SITE_OTHER): Payer: Medicare Other | Admitting: Medical

## 2015-01-04 ENCOUNTER — Encounter: Payer: Self-pay | Admitting: Medical

## 2015-01-04 VITALS — BP 142/85 | HR 68 | Temp 97.8°F | Ht 64.0 in | Wt 181.0 lb

## 2015-01-04 DIAGNOSIS — T148 Other injury of unspecified body region: Secondary | ICD-10-CM

## 2015-01-04 DIAGNOSIS — L089 Local infection of the skin and subcutaneous tissue, unspecified: Secondary | ICD-10-CM

## 2015-01-04 DIAGNOSIS — T148XXA Other injury of unspecified body region, initial encounter: Secondary | ICD-10-CM | POA: Insufficient documentation

## 2015-01-04 MED ORDER — MUPIROCIN 2 % EX OINT: TOPICAL_OINTMENT | CUTANEOUS | Status: DC

## 2015-01-04 NOTE — Patient Instructions (Signed)
Skin infection Previous area in front ear and neck now resolved completley with doxycycline.   Abrasion On bridge of nose. Will rx mupircon and advised to use for 3-5 days. If area changes or worsens notify us.     Follow up as regularly scheduled with pcp or as needed with myself.

## 2015-01-04 NOTE — Assessment & Plan Note (Signed)
On bridge of nose. Will rx mupircon and advised to use for 3-5 days. If area changes or worsens notify us.

## 2015-01-04 NOTE — Assessment & Plan Note (Signed)
Previous area in front ear and neck now resolved completley with doxycycline.

## 2015-01-04 NOTE — Progress Notes (Signed)
Pre visit review using our clinic review tool, if applicable. No additional management support is needed unless otherwise documented below in the visit note.

## 2015-01-04 NOTE — Progress Notes (Signed)
   Subjective:    Patient ID: Kimberly Irwin, female    DOB: January 27, 1946, 69 y.o.   MRN: [id number]  HPI  Pt in states that prior swelling from last visit and redness resolved with antibiotic. Mild ha and st went away as well. Below is last note. All symptoms resolved.Below paragraph is from prior note.    Pt states that yesterday she noticed swelling in front of her left ear and some on left side upper neck was swollen. She has some pain on moving. Minimal faint ha. But not localizing ha to any particular area. Pt placed some warm compresses on area. Today less swelling and less pain. No fever, no chills or sweats. Faint mild st on Monday but none since. No teeth pain. False teeth on top. She thought maybe mild ear ache. No nausea or vomiting. No gross motor or sensory function deficts reported. Overall she is feeling a lot better today compared to yesterday.    Review of Systems  Constitutional: Negative for fever, chills, diaphoresis, activity change and fatigue.  Respiratory: Negative for cough, chest tightness and shortness of breath.   Cardiovascular: Negative for chest pain, palpitations and leg swelling.  Gastrointestinal: Negative for nausea, vomiting and abdominal pain.  Musculoskeletal: Negative for neck pain and neck stiffness.  Skin:       Last night went to sleep and no skin symptoms. Then this am on bridge of nose mild red and she thinks she may have scraped he nose sleeping.   Neurological: Negative for dizziness, tremors, seizures, syncope, facial asymmetry, speech difficulty, weakness, light-headedness, numbness and headaches.  Psychiatric/Behavioral: Negative for behavioral problems, confusion and agitation. The patient is not nervous/anxious.        Objective:   Physical Exam  General  Mental Status - Alert. General Appearance - Well groomed. Not in acute distress.  Skin Bridge of nose mild scrape and redness.  HEENT Head- Normal. Ear Auditory Canal - Left-  Normal. Right - Normal.Tympanic Membrane- Left- faint dull appearance.. Right- Normal. Eye Sclera/Conjunctiva- Left- Normal. Right- Normal. Nose & Sinuses Nasal Mucosa- Left- Notb oggy or Congested. Right- Not boggy or Congested. Mouth & Throat Lips: Upper Lip- Normal: no dryness, cracking, pallor, cyanosis, or vesicular eruption. Lower Lip-Normal: no dryness, cracking, pallor, cyanosis or vesicular eruption. Buccal Mucosa- Bilateral- No Aphthous ulcers. Oropharynx- No Discharge or Erythema. Tonsils: Characteristics- Bilateral- No Erythema or Congestion. Size/Enlargement- Bilateral- No enlargement. Discharge- bilateral-None. No parotid gland swelling either side.   Neck Neck- Supple. No Masses. No lymphadnopahy. No carotid bruits. lt trapezius is not  faint tender to palpation. Normal color. No redness   Chest and Lung Exam Auscultation: Breath Sounds:- even and unlabored  Cardiovascular Auscultation:Rythm- Regular, rate and rhythm. Murmurs & Other Heart Sounds:Ausculatation of the heart reveal- No Murmurs.  Lymphatic Head & Neck General Head & Neck Lymphatics: Bilateral: Description- No Localized lymphadenopathy       Assessment & Plan:

## 2015-02-13 DIAGNOSIS — M4806 Spinal stenosis, lumbar region: Secondary | ICD-10-CM | POA: Diagnosis not present

## 2015-02-13 DIAGNOSIS — Z6835 Body mass index (BMI) 35.0-35.9, adult: Secondary | ICD-10-CM | POA: Diagnosis not present

## 2015-02-13 DIAGNOSIS — I10 Essential (primary) hypertension: Secondary | ICD-10-CM | POA: Diagnosis not present

## 2015-02-21 ENCOUNTER — Encounter (INDEPENDENT_AMBULATORY_CARE_PROVIDER_SITE_OTHER): Payer: Medicare Other | Admitting: Ophthalmology

## 2015-02-21 DIAGNOSIS — I10 Essential (primary) hypertension: Secondary | ICD-10-CM

## 2015-02-21 DIAGNOSIS — H43813 Vitreous degeneration, bilateral: Secondary | ICD-10-CM

## 2015-02-21 DIAGNOSIS — H34833 Tributary (branch) retinal vein occlusion, bilateral: Secondary | ICD-10-CM

## 2015-02-21 DIAGNOSIS — H35033 Hypertensive retinopathy, bilateral: Secondary | ICD-10-CM | POA: Diagnosis not present

## 2015-02-26 ENCOUNTER — Encounter (INDEPENDENT_AMBULATORY_CARE_PROVIDER_SITE_OTHER): Payer: Medicare Other | Admitting: Ophthalmology

## 2015-03-09 ENCOUNTER — Other Ambulatory Visit: Payer: Self-pay | Admitting: Family Medicine

## 2015-03-09 ENCOUNTER — Encounter: Payer: Self-pay | Admitting: Family Medicine

## 2015-03-09 ENCOUNTER — Ambulatory Visit (INDEPENDENT_AMBULATORY_CARE_PROVIDER_SITE_OTHER): Payer: Medicare Other | Admitting: Family Medicine

## 2015-03-09 VITALS — BP 128/86 | HR 68 | Temp 98.8°F | Ht 64.0 in | Wt 180.5 lb

## 2015-03-09 DIAGNOSIS — K219 Gastro-esophageal reflux disease without esophagitis: Secondary | ICD-10-CM

## 2015-03-09 DIAGNOSIS — R251 Tremor, unspecified: Secondary | ICD-10-CM | POA: Diagnosis not present

## 2015-03-09 DIAGNOSIS — R739 Hyperglycemia, unspecified: Secondary | ICD-10-CM | POA: Diagnosis not present

## 2015-03-09 DIAGNOSIS — E038 Other specified hypothyroidism: Secondary | ICD-10-CM | POA: Diagnosis not present

## 2015-03-09 DIAGNOSIS — F32A Depression, unspecified: Secondary | ICD-10-CM

## 2015-03-09 DIAGNOSIS — I10 Essential (primary) hypertension: Secondary | ICD-10-CM

## 2015-03-09 DIAGNOSIS — E785 Hyperlipidemia, unspecified: Secondary | ICD-10-CM

## 2015-03-09 DIAGNOSIS — E669 Obesity, unspecified: Secondary | ICD-10-CM

## 2015-03-09 DIAGNOSIS — F329 Major depressive disorder, single episode, unspecified: Secondary | ICD-10-CM

## 2015-03-09 LAB — COMPREHENSIVE METABOLIC PANEL
ALT: 29 U/L (ref 0–35)
AST: 30 U/L (ref 0–37)
Albumin: 4.2 g/dL (ref 3.5–5.2)
Alkaline Phosphatase: 61 U/L (ref 39–117)
BUN: 15 mg/dL (ref 6–23)
CO2: 27 meq/L (ref 19–32)
CREATININE: 0.82 mg/dL (ref 0.40–1.20)
Calcium: 10 mg/dL (ref 8.4–10.5)
Chloride: 101 mEq/L (ref 96–112)
GFR: 73.06 mL/min (ref >60.00)
GLUCOSE: 112 mg/dL — AB (ref 70–99)
Potassium: 3.5 mEq/L (ref 3.5–5.1)
Sodium: 139 mEq/L (ref 135–145)
Total Bilirubin: 0.7 mg/dL (ref 0.2–1.2)
Total Protein: 7.7 g/dL (ref 6.0–8.3)

## 2015-03-09 LAB — CBC
HEMATOCRIT: 45 % (ref 36.0–46.0)
Hemoglobin: 15.5 g/dL — ABNORMAL HIGH (ref 12.0–15.0)
MCHC: 34.3 g/dL (ref 30.0–36.0)
MCV: 89.9 fl (ref 78.0–100.0)
Platelets: 211 10*3/uL (ref 150.0–400.0)
RBC: 5.01 Mil/uL (ref 3.87–5.11)
RDW: 13.3 % (ref 11.5–15.5)
WBC: 11.2 10*3/uL — ABNORMAL HIGH (ref 4.0–10.5)

## 2015-03-09 LAB — LDL CHOLESTEROL, DIRECT: Direct LDL: 123 mg/dL

## 2015-03-09 LAB — LIPID PANEL
Cholesterol: 182 mg/dL (ref 0–200)
HDL: 41.3 mg/dL (ref >39.00)
NonHDL: 140.29
TRIGLYCERIDES: 230 mg/dL — AB (ref 0.0–149.0)
Total CHOL/HDL Ratio: 4
VLDL: 46 mg/dL — AB (ref 0.0–40.0)

## 2015-03-09 LAB — HEMOGLOBIN A1C: Hgb A1c MFr Bld: 6 % (ref 4.6–6.5)

## 2015-03-09 LAB — TSH: TSH: 1.71 u[IU]/mL (ref 0.35–4.50)

## 2015-03-09 MED ORDER — ATENOLOL 50 MG PO TABS: 50.0000 mg | ORAL_TABLET | Freq: Every day | ORAL | Status: DC

## 2015-03-09 MED ORDER — HYDROCHLOROTHIAZIDE 25 MG PO TABS: ORAL_TABLET | ORAL | Status: DC

## 2015-03-09 MED ORDER — LEVOTHYROXINE SODIUM 112 MCG PO TABS: 112.0000 ug | ORAL_TABLET | Freq: Every day | ORAL | Status: DC

## 2015-03-09 MED ORDER — CITALOPRAM HYDROBROMIDE 10 MG PO TABS: 10.0000 mg | ORAL_TABLET | Freq: Every day | ORAL | Status: DC

## 2015-03-09 NOTE — Assessment & Plan Note (Signed)
Well controlled, no changes to meds. Encouraged heart healthy diet such as the DASH diet and exercise as tolerated.

## 2015-03-09 NOTE — Assessment & Plan Note (Signed)
Avoid offending foods, start probiotics. Do not eat large meals in late evening and consider raising head of bed.

## 2015-03-09 NOTE — Assessment & Plan Note (Signed)
Encouraged DASH diet, decrease po intake and increase exercise as tolerated. Needs 7-8 hours of sleep nightly. Avoid trans fats, eat small, frequent meals every 4-5 hours with lean proteins, complex carbs and healthy fats. Minimize simple carbs

## 2015-03-09 NOTE — Patient Instructions (Signed)

## 2015-03-09 NOTE — Assessment & Plan Note (Signed)
Tolerating statin, encouraged heart healthy diet, avoid trans fats, minimize simple carbs and saturated fats. Increase exercise as tolerated 

## 2015-03-09 NOTE — Progress Notes (Signed)
Pre visit review using our clinic review tool, if applicable. No additional management support is needed unless otherwise documented below in the visit note.

## 2015-03-25 NOTE — Progress Notes (Signed)
Subjective:    Patient ID: Kimberly Irwin, female    DOB: [birth year], 69 y.o.   MRN: [id number]  Chief Complaint  Patient presents with  . Follow-up    HPI Patient is in today for female in today for follow up. Struggling with increased stress, depression, fatigue. No suicidal or homicidal ideation. No other recent illness. Continues to struggle with back pain daily. No new symptoms no recent trauma. Denies CP/palp/SOB/HA/congestion/fevers/GI or GU c/o. Taking meds as prescribed  Past Medical History  Diagnosis Date  . Sarcoidosis   . Hyperlipidemia   . GERD (gastroesophageal reflux disease)   . Hypertension   . Chronic low back pain   . DDD (degenerative disc disease)     L3-4 with facet arthropathy and stenosis  . Degenerative spondylolisthesis     L4-5 grade 1 with stenosis  . Acute hip pain 10/26/2012  . Bursitis of left hip 10/26/2012  . Pain in joint, lower leg 10/26/2012    left   . Tremors of nervous system   . Hypothyroidism   . Heart murmur     as an infant  . Pneumonia     as a child several times.   . History of hiatal hernia     Past Surgical History  Procedure Laterality Date  . Carpal tunnel release Bilateral   . Thyroidectomy    . Back surgery  01/15/09    L3-4 and L4-5 decompressive laminectomy with bilateral L3, L4, and L5 decompressive foraminotomies, more than it would be required for simple interbody fusion alone.  . Back surgery  01/15/09    L3-4 and L4-5 posterior lumbar interbody fusion utilizing tanget interbody allograft wedge, Telamon interbody PEEk cage, and local autografting.  . Back surgery  01/15/09    L3, L4, and L5 posterolateral arthrodesis using segmental pedicle screw fixation and local autografting.  . Joint replacement  09/13/09    Right Hip, Dr. Alvan Dame  . Total hip arthroplasty Left 06/28/2013    Procedure: LEFT TOTAL  HIP ARTHROPLASTY ANTERIOR APPROACH;  Surgeon: Mauri Pole, MD;  Location: [facility];  Service: Orthopedics;  Laterality:  Left;  Marland Kitchen Eye surgery      lens implant  . Colonoscopy    . Esophagogastroduodenoscopy    . Cardiac catheterization    . Anterior lat lumbar fusion Left 08/29/2014    Procedure: EXTREME LEFT LATERAL INTERBODY FUSION LUMBAR TWO-THREE LATERAL PLATE;  Surgeon: Charlie Pitter, MD;  Location: [facility];  Service: Neurosurgery;  Laterality: Left;  Marland Kitchen Kyphoplasty N/A 09/14/2014    Procedure: Lumbar Two Kyphoplasty/Vertebroplasty;  Surgeon: Charlie Pitter, MD;  Location: [facility];  Service: Neurosurgery;  Laterality: N/A;  Lumbar Two Kyphoplasty/Vertebroplasty    Family History  Problem Relation Age of Onset  . Cancer Mother 44    Breast  . Heart disease Mother   . Hypertension Mother   . Hyperlipidemia Mother   . Heart attack Mother   . Asthma Maternal Grandmother     Social History   Social History  . Marital Status: Widowed    Spouse Name: N/A  . Number of Children: 2  . Years of Education: N/A   Occupational History  . Not on file.   Social History Main Topics  . Smoking status: Former Smoker -- 0.50 packs/day for 32 years    Types: Cigarettes    Quit date: 08/04/1993  . Smokeless tobacco: Never Used  . Alcohol Use: No  . Drug Use: No  .  Sexual Activity: Not on file   Other Topics Concern  . Not on file   Social History Narrative   Married 36 years and has 2 children (2 daughters)   Alcohol Use - no   Former Smoker - she quit 10 years ago, started when she was 54 and has had varying level of use of tobacco products from 1/2 pack to 3 packs per day.    Outpatient Prescriptions Prior to Visit  Medication Sig Dispense Refill  . aspirin EC 81 MG tablet Take 81 mg by mouth daily.    Marland Kitchen BESIVANCE 0.6 % SUSP   12  . Calcium Carbonate-Vit D-Min (CALCIUM 1200 PO) Take 1,200 mg by mouth daily.     . Cholecalciferol (VITAMIN D) 2000 UNITS tablet Take 2,000 Units by mouth daily.    . Cinnamon 500 MG capsule Take 2,000 mg by mouth daily.      . Coenzyme Q10 200 MG capsule Take  200 mg by mouth daily.    . CRESTOR 10 MG tablet TAKE 1 TABLET (10 MG TOTAL) BY MOUTH DAILY. TAKE 1 TABLET ON SATURDAY AND 1/2 TAB DAILY 30 tablet 6  . esomeprazole (NEXIUM) 40 MG capsule Take 40 mg by mouth daily at 12 noon.    . gabapentin (NEURONTIN) 300 MG capsule Take 1 capsule (300 mg total) by mouth 2 (two) times daily as needed. (Patient taking differently: Take 300 mg by mouth at bedtime. ) 1809 capsule 2  . mupirocin ointment (BACTROBAN) 2 % Apply twice daily to bridge of the nose. 22 g 0  . Omega-3 Fatty Acids (FISH OIL) 1200 MG CAPS Take 2,400 mg by mouth daily.     . traMADol (ULTRAM) 50 MG tablet Take 2 tablets (100 mg total) by mouth every 6 (six) hours as needed for moderate pain or severe pain. 120 tablet 0  . atenolol (TENORMIN) 50 MG tablet Take 1 tablet (50 mg total) by mouth daily. 90 tablet 1  . hydrochlorothiazide (HYDRODIURIL) 25 MG tablet TAKE 1 TABLET (25 MG TOTAL) BY MOUTH DAILY. 90 tablet 1  . levothyroxine (SYNTHROID, LEVOTHROID) 112 MCG tablet Take 1 tablet (112 mcg total) by mouth daily before breakfast. 90 tablet 1  . cyclobenzaprine (FLEXERIL) 10 MG tablet Take 1 tablet (10 mg total) by mouth 3 (three) times daily as needed for muscle spasms. 30 tablet 0  . doxycycline (VIBRA-TABS) 100 MG tablet Take 1 tablet (100 mg total) by mouth 2 (two) times daily. 14 tablet 0   No facility-administered medications prior to visit.    Allergies  Allergen Reactions  . Lipitor [Atorvastatin] Swelling  . Metronidazole Swelling  . Penicillins Swelling  . Pregabalin Other (See Comments)    REACTION: Somnolence and dizziness  . Simvastatin Other (See Comments)    Leg pain    Review of Systems  Constitutional: Positive for malaise/fatigue. Negative for fever.  HENT: Negative for congestion.   Eyes: Negative for discharge.  Respiratory: Negative for shortness of breath.   Cardiovascular: Negative for chest pain, palpitations and leg swelling.  Gastrointestinal: Negative  for nausea and abdominal pain.  Genitourinary: Negative for dysuria.  Musculoskeletal: Positive for back pain and neck pain. Negative for falls.  Skin: Negative for rash.  Neurological: Negative for loss of consciousness and headaches.  Endo/Heme/Allergies: Negative for environmental allergies.  Psychiatric/Behavioral: Positive for depression. The patient is not nervous/anxious.        Objective:    Physical Exam  Constitutional: She is oriented to person, place,  and time. She appears well-developed and well-nourished. No distress.  HENT:  Head: Normocephalic and atraumatic.  Nose: Nose normal.  Eyes: Right eye exhibits no discharge. Left eye exhibits no discharge.  Neck: Normal range of motion. Neck supple.  Cardiovascular: Normal rate and regular rhythm.   No murmur heard. Pulmonary/Chest: Effort normal and breath sounds normal.  Abdominal: Soft. Bowel sounds are normal. There is no tenderness.  Musculoskeletal: She exhibits no edema.  Neurological: She is alert and oriented to person, place, and time.  Skin: Skin is warm and dry.  Psychiatric: She has a normal mood and affect.  Nursing note and vitals reviewed.   BP 128/86 mmHg  Pulse 68  Temp(Src) 98.8 F (37.1 C) (Oral)  Ht _0  (1.626 m)  Wt 180 lb 8 oz (81.874 kg)  BMI 30.97 kg/m2  SpO2 95% Wt Readings from Last 3 Encounters:  03/09/15 180 lb 8 oz (81.874 kg)  01/04/15 181 lb (82.101 kg)  12/28/14 181 lb (82.101 kg)     Lab Results  Component Value Date   WBC 11.2* 03/09/2015   HGB 15.5* 03/09/2015   HCT 45.0 03/09/2015   PLT 211.0 03/09/2015   GLUCOSE 112* 03/09/2015   CHOL 182 03/09/2015   TRIG 230.0* 03/09/2015   HDL 41.30 03/09/2015   LDLDIRECT 123.0 03/09/2015   LDLCALC 96 08/28/2014   ALT 29 03/09/2015   AST 30 03/09/2015   NA 139 03/09/2015   K 3.5 03/09/2015   CL 101 03/09/2015   CREATININE 0.82 03/09/2015   BUN 15 03/09/2015   CO2 27 03/09/2015   TSH 1.71 03/09/2015   INR 1.06  06/23/2013   HGBA1C 6.0 03/09/2015   MICROALBUR 0.2 04/27/2014    Lab Results  Component Value Date   TSH 1.71 03/09/2015   Lab Results  Component Value Date   WBC 11.2* 03/09/2015   HGB 15.5* 03/09/2015   HCT 45.0 03/09/2015   MCV 89.9 03/09/2015   PLT 211.0 03/09/2015   Lab Results  Component Value Date   NA 139 03/09/2015   K 3.5 03/09/2015   CO2 27 03/09/2015   GLUCOSE 112* 03/09/2015   BUN 15 03/09/2015   CREATININE 0.82 03/09/2015   BILITOT 0.7 03/09/2015   ALKPHOS 61 03/09/2015   AST 30 03/09/2015   ALT 29 03/09/2015   PROT 7.7 03/09/2015   ALBUMIN 4.2 03/09/2015   CALCIUM 10.0 03/09/2015   ANIONGAP 9 09/14/2014   GFR 73.06 03/09/2015   Lab Results  Component Value Date   CHOL 182 03/09/2015   Lab Results  Component Value Date   HDL 41.30 03/09/2015   Lab Results  Component Value Date   LDLCALC 96 08/28/2014   Lab Results  Component Value Date   TRIG 230.0* 03/09/2015   Lab Results  Component Value Date   CHOLHDL 4 03/09/2015   Lab Results  Component Value Date   HGBA1C 6.0 03/09/2015       Assessment & Plan:   Problem List Items Addressed This Visit    Obesity (Chronic)    Encouraged DASH diet, decrease po intake and increase exercise as tolerated. Needs 7-8 hours of sleep nightly. Avoid trans fats, eat small, frequent meals every 4-5 hours with lean proteins, complex carbs and healthy fats. Minimize simple carbs      Hypothyroidism (Chronic)    On Levothyroxine, continue to monitor      Relevant Medications   atenolol (TENORMIN) 50 MG tablet   levothyroxine (SYNTHROID, LEVOTHROID) 112 MCG  tablet   Other Relevant Orders   TSH (Completed)   Hyperlipidemia (Chronic)    Tolerating statin, encouraged heart healthy diet, avoid trans fats, minimize simple carbs and saturated fats. Increase exercise as tolerated      Relevant Medications   atenolol (TENORMIN) 50 MG tablet   hydrochlorothiazide (HYDRODIURIL) 25 MG tablet   Other  Relevant Orders   Lipid panel (Completed)   RESOLVED: Hyperglycemia   Relevant Orders   Lipid panel (Completed)   Hemoglobin A1c (Completed)   GERD (Chronic)    Avoid offending foods, start probiotics. Do not eat large meals in late evening and consider raising head of bed.       Essential hypertension (Chronic)    Well controlled, no changes to meds. Encouraged heart healthy diet such as the DASH diet and exercise as tolerated.       Relevant Medications   atenolol (TENORMIN) 50 MG tablet   hydrochlorothiazide (HYDRODIURIL) 25 MG tablet   Depression (Chronic)    Encouraged to try a course of Citalopram 10 mg daily, reassess at next visit or as needed. Warned regarding possible side effects      Relevant Medications   citalopram (CELEXA) 10 MG tablet    Other Visit Diagnoses    Essential hypertension    -  Primary    Relevant Medications    atenolol (TENORMIN) 50 MG tablet    hydrochlorothiazide (HYDRODIURIL) 25 MG tablet    Other Relevant Orders    CBC (Completed)    Comprehensive metabolic panel (Completed)    TSH (Completed)    Tremor        Relevant Medications    atenolol (TENORMIN) 50 MG tablet       I have discontinued Ms. Barbero's cyclobenzaprine and doxycycline. I am also having her start on citalopram. Additionally, I am having her maintain her Cinnamon, Fish Oil, Calcium Carbonate-Vit D-Min (CALCIUM 1200 PO), Vitamin D, Coenzyme Q10, gabapentin, esomeprazole, aspirin EC, CRESTOR, traMADol, BESIVANCE, mupirocin ointment, atenolol, hydrochlorothiazide, and levothyroxine.  Meds ordered this encounter  Medications  . atenolol (TENORMIN) 50 MG tablet    Sig: Take 1 tablet (50 mg total) by mouth daily.    Dispense:  90 tablet    Refill:  1    D/C PREVIOUS SCRIPTS FOR THIS MEDICATION  . hydrochlorothiazide (HYDRODIURIL) 25 MG tablet    Sig: TAKE 1 TABLET (25 MG TOTAL) BY MOUTH DAILY.    Dispense:  90 tablet    Refill:  1    D/C PREVIOUS SCRIPTS FOR THIS  MEDICATION  . levothyroxine (SYNTHROID, LEVOTHROID) 112 MCG tablet    Sig: Take 1 tablet (112 mcg total) by mouth daily before breakfast.    Dispense:  90 tablet    Refill:  1    D/C PREVIOUS SCRIPTS FOR THIS MEDICATION  . citalopram (CELEXA) 10 MG tablet    Sig: Take 1 tablet (10 mg total) by mouth daily.    Dispense:  30 tablet    Refill:  3     Penni Homans, MD

## 2015-03-25 NOTE — Assessment & Plan Note (Signed)
Encouraged to try a course of Citalopram 10 mg daily, reassess at next visit or as needed. Warned regarding possible side effects

## 2015-03-25 NOTE — Assessment & Plan Note (Signed)
On Levothyroxine, continue to monitor

## 2015-05-15 DIAGNOSIS — Z6834 Body mass index (BMI) 34.0-34.9, adult: Secondary | ICD-10-CM | POA: Diagnosis not present

## 2015-05-15 DIAGNOSIS — I10 Essential (primary) hypertension: Secondary | ICD-10-CM | POA: Diagnosis not present

## 2015-05-15 DIAGNOSIS — M4806 Spinal stenosis, lumbar region: Secondary | ICD-10-CM | POA: Diagnosis not present

## 2015-05-16 ENCOUNTER — Encounter (INDEPENDENT_AMBULATORY_CARE_PROVIDER_SITE_OTHER): Payer: Medicare Other | Admitting: Ophthalmology

## 2015-05-16 DIAGNOSIS — H348322 Tributary (branch) retinal vein occlusion, left eye, stable: Secondary | ICD-10-CM

## 2015-05-16 DIAGNOSIS — H43813 Vitreous degeneration, bilateral: Secondary | ICD-10-CM

## 2015-05-16 DIAGNOSIS — I10 Essential (primary) hypertension: Secondary | ICD-10-CM

## 2015-05-16 DIAGNOSIS — H35033 Hypertensive retinopathy, bilateral: Secondary | ICD-10-CM

## 2015-05-23 DIAGNOSIS — Z803 Family history of malignant neoplasm of breast: Secondary | ICD-10-CM | POA: Diagnosis not present

## 2015-05-23 DIAGNOSIS — Z1231 Encounter for screening mammogram for malignant neoplasm of breast: Secondary | ICD-10-CM | POA: Diagnosis not present

## 2015-05-23 LAB — HM MAMMOGRAPHY

## 2015-05-25 ENCOUNTER — Encounter: Payer: Self-pay | Admitting: Family Medicine

## 2015-06-15 ENCOUNTER — Encounter: Payer: Self-pay | Admitting: Family Medicine

## 2015-06-15 ENCOUNTER — Ambulatory Visit (INDEPENDENT_AMBULATORY_CARE_PROVIDER_SITE_OTHER): Payer: Medicare Other | Admitting: Family Medicine

## 2015-06-15 VITALS — BP 122/78 | HR 67 | Temp 98.5°F | Ht 64.0 in | Wt 176.0 lb

## 2015-06-15 DIAGNOSIS — Z23 Encounter for immunization: Secondary | ICD-10-CM

## 2015-06-15 DIAGNOSIS — K219 Gastro-esophageal reflux disease without esophagitis: Secondary | ICD-10-CM | POA: Diagnosis not present

## 2015-06-15 DIAGNOSIS — J309 Allergic rhinitis, unspecified: Secondary | ICD-10-CM | POA: Diagnosis not present

## 2015-06-15 DIAGNOSIS — R739 Hyperglycemia, unspecified: Secondary | ICD-10-CM | POA: Diagnosis not present

## 2015-06-15 DIAGNOSIS — E785 Hyperlipidemia, unspecified: Secondary | ICD-10-CM | POA: Diagnosis not present

## 2015-06-15 DIAGNOSIS — R251 Tremor, unspecified: Secondary | ICD-10-CM

## 2015-06-15 DIAGNOSIS — E669 Obesity, unspecified: Secondary | ICD-10-CM

## 2015-06-15 DIAGNOSIS — E039 Hypothyroidism, unspecified: Secondary | ICD-10-CM | POA: Diagnosis not present

## 2015-06-15 DIAGNOSIS — I10 Essential (primary) hypertension: Secondary | ICD-10-CM | POA: Diagnosis not present

## 2015-06-15 LAB — CBC
HEMATOCRIT: 45.2 % (ref 36.0–46.0)
HEMOGLOBIN: 15.2 g/dL — AB (ref 12.0–15.0)
MCHC: 33.6 g/dL (ref 30.0–36.0)
MCV: 89.2 fl (ref 78.0–100.0)
PLATELETS: 209 10*3/uL (ref 150.0–400.0)
RBC: 5.06 Mil/uL (ref 3.87–5.11)
RDW: 13.3 % (ref 11.5–15.5)
WBC: 10 10*3/uL (ref 4.0–10.5)

## 2015-06-15 LAB — COMPREHENSIVE METABOLIC PANEL
ALT: 22 U/L (ref 0–35)
AST: 24 U/L (ref 0–37)
Albumin: 4.2 g/dL (ref 3.5–5.2)
Alkaline Phosphatase: 57 U/L (ref 39–117)
BILIRUBIN TOTAL: 0.7 mg/dL (ref 0.2–1.2)
BUN: 17 mg/dL (ref 6–23)
CO2: 30 meq/L (ref 19–32)
Calcium: 10 mg/dL (ref 8.4–10.5)
Chloride: 102 mEq/L (ref 96–112)
Creatinine, Ser: 0.87 mg/dL (ref 0.40–1.20)
GFR: 68.18 mL/min (ref >60.00)
GLUCOSE: 128 mg/dL — AB (ref 70–99)
Potassium: 3.8 mEq/L (ref 3.5–5.1)
SODIUM: 141 meq/L (ref 135–145)
TOTAL PROTEIN: 7.4 g/dL (ref 6.0–8.3)

## 2015-06-15 LAB — LIPID PANEL
CHOL/HDL RATIO: 4
Cholesterol: 176 mg/dL (ref 0–200)
HDL: 43.1 mg/dL (ref >39.00)
LDL Cholesterol: 99 mg/dL (ref 0–99)
NONHDL: 132.77
Triglycerides: 171 mg/dL — ABNORMAL HIGH (ref 0.0–149.0)
VLDL: 34.2 mg/dL (ref 0.0–40.0)

## 2015-06-15 LAB — HEMOGLOBIN A1C: HEMOGLOBIN A1C: 5.7 % (ref 4.6–6.5)

## 2015-06-15 LAB — TSH: TSH: 0.39 u[IU]/mL (ref 0.35–4.50)

## 2015-06-15 MED ORDER — MONTELUKAST SODIUM 10 MG PO TABS: 10.0000 mg | ORAL_TABLET | Freq: Every day | ORAL | Status: DC

## 2015-06-15 NOTE — Patient Instructions (Signed)
Hypothyroidism Hypothyroidism is a disorder of the thyroid. The thyroid is a large gland that is located in the lower front of the neck. The thyroid releases hormones that control how the body works. With hypothyroidism, the thyroid does not make enough of these hormones. CAUSES Causes of hypothyroidism may include:  Viral infections.  Pregnancy.  Your own defense system (immune system) attacking your thyroid.  Certain medicines.  Birth defects.  Past radiation treatments to your head or neck.  Past treatment with radioactive iodine.  Past surgical removal of part or all of your thyroid.  Problems with the gland that is located in the center of your brain (pituitary). SIGNS AND SYMPTOMS Signs and symptoms of hypothyroidism may include:  Feeling as though you have no energy (lethargy).  Inability to tolerate cold.  Weight gain that is not explained by a change in diet or exercise habits.  Dry skin.  Coarse hair.  Menstrual irregularity.  Slowing of thought processes.  Constipation.  Sadness or depression. DIAGNOSIS  Your health care provider may diagnose hypothyroidism with blood tests and ultrasound tests. TREATMENT Hypothyroidism is treated with medicine that replaces the hormones that your body does not make. After you begin treatment, it may take several weeks for symptoms to go away. HOME CARE INSTRUCTIONS   Take medicines only as directed by your health care provider.  If you start taking any new medicines, tell your health care provider.  Keep all follow-up visits as directed by your health care provider. This is important. As your condition improves, your dosage needs may change. You will need to have blood tests regularly so that your health care provider can watch your condition. SEEK MEDICAL CARE IF:  Your symptoms do not get better with treatment.  You are taking thyroid replacement medicine and:  You sweat excessively.  You have tremors.  You  feel anxious.  You lose weight rapidly.  You cannot tolerate heat.  You have emotional swings.  You have diarrhea.  You feel weak. SEEK IMMEDIATE MEDICAL CARE IF:   You develop chest pain.  You develop an irregular heartbeat.  You develop a rapid heartbeat.   This information is not intended to replace advice given to you by your health care provider. Make sure you discuss any questions you have with your health care provider.   Document Released: 07/21/2005 Document Revised: 08/11/2014 Document Reviewed: 12/06/2013 Elsevier Interactive Patient Education [employer].

## 2015-06-15 NOTE — Progress Notes (Signed)
Pre visit review using our clinic review tool, if applicable. No additional management support is needed unless otherwise documented below in the visit note.

## 2015-06-15 NOTE — Assessment & Plan Note (Signed)
Worsening most notable in head and right leg, no falls or disability from it but she is getting concerned will refer to neurology

## 2015-06-16 LAB — HEPATITIS C ANTIBODY: HCV AB: NEGATIVE

## 2015-06-24 NOTE — Assessment & Plan Note (Signed)
On Levothyroxine, continue to monitor

## 2015-06-24 NOTE — Progress Notes (Signed)
Subjective:    Patient ID: Kimberly Irwin, female    DOB: 1945/11/16, 69 y.o.   MRN: [id number]  Chief Complaint  Patient presents with  . Follow-up    HPI Patient is in today for patient is in today for follow-up. Is generally doing well but acknowledges her tremors slowly getting worse. She notes it is worse when she is around people and anxious. His most notable in her head and right leg. No other acute illness. Denies CP/palp/SOB/HA/congestion/fevers/GI or GU c/o. Taking meds as prescribed  Past Medical History  Diagnosis Date  . Sarcoidosis ([place])   . Hyperlipidemia   . GERD (gastroesophageal reflux disease)   . Hypertension   . Chronic low back pain   . DDD (degenerative disc disease)     L3-4 with facet arthropathy and stenosis  . Degenerative spondylolisthesis     L4-5 grade 1 with stenosis  . Acute hip pain 10/26/2012  . Bursitis of left hip 10/26/2012  . Pain in joint, lower leg 10/26/2012    left   . Tremors of nervous system   . Hypothyroidism   . Heart murmur     as an infant  . Pneumonia     as a child several times.   . History of hiatal hernia     Past Surgical History  Procedure Laterality Date  . Carpal tunnel release Bilateral   . Thyroidectomy    . Back surgery  01/15/09    L3-4 and L4-5 decompressive laminectomy with bilateral L3, L4, and L5 decompressive foraminotomies, more than it would be required for simple interbody fusion alone.  . Back surgery  01/15/09    L3-4 and L4-5 posterior lumbar interbody fusion utilizing tanget interbody allograft wedge, Telamon interbody PEEk cage, and local autografting.  . Back surgery  01/15/09    L3, L4, and L5 posterolateral arthrodesis using segmental pedicle screw fixation and local autografting.  . Joint replacement  09/13/09    Right Hip, Dr. Alvan Dame  . Total hip arthroplasty Left 06/28/2013    Procedure: LEFT TOTAL  HIP ARTHROPLASTY ANTERIOR APPROACH;  Surgeon: Mauri Pole, MD;  Location: [facility];  Service:  Orthopedics;  Laterality: Left;  Marland Kitchen Eye surgery      lens implant  . Colonoscopy    . Esophagogastroduodenoscopy    . Cardiac catheterization    . Anterior lat lumbar fusion Left 08/29/2014    Procedure: EXTREME LEFT LATERAL INTERBODY FUSION LUMBAR TWO-THREE LATERAL PLATE;  Surgeon: Charlie Pitter, MD;  Location: [place] [facility];  Service: Neurosurgery;  Laterality: Left;  Marland Kitchen Kyphoplasty N/A 09/14/2014    Procedure: Lumbar Two Kyphoplasty/Vertebroplasty;  Surgeon: Charlie Pitter, MD;  Location: [place] [facility];  Service: Neurosurgery;  Laterality: N/A;  Lumbar Two Kyphoplasty/Vertebroplasty    Family History  Problem Relation Age of Onset  . Cancer Mother 96    Breast  . Heart disease Mother   . Hypertension Mother   . Hyperlipidemia Mother   . Heart attack Mother   . Asthma Maternal Grandmother     Social History   Social History  . Marital Status: Widowed    Spouse Name: N/A  . Number of Children: 2  . Years of Education: N/A   Occupational History  . Not on file.   Social History Main Topics  . Smoking status: Former Smoker -- 0.50 packs/day for 32 years    Types: Cigarettes    Quit date: 08/04/1993  . Smokeless tobacco: Never Used  .  Alcohol Use: No  . Drug Use: No  . Sexual Activity: Not on file   Other Topics Concern  . Not on file   Social History Narrative   Married 79 years and has 2 children (2 daughters)   Alcohol Use - no   Former Smoker - she quit 10 years ago, started when she was 61 and has had varying level of use of tobacco products from 1/2 pack to 3 packs per day.    Outpatient Prescriptions Prior to Visit  Medication Sig Dispense Refill  . aspirin EC 81 MG tablet Take 81 mg by mouth daily.    Marland Kitchen atenolol (TENORMIN) 50 MG tablet Take 1 tablet (50 mg total) by mouth daily. 90 tablet 1  . BESIVANCE 0.6 % SUSP   12  . Calcium Carbonate-Vit D-Min (CALCIUM 1200 PO) Take 1,200 mg by mouth daily.     . Cholecalciferol (VITAMIN D) 2000 UNITS tablet Take 2,000  Units by mouth daily.    . Cinnamon 500 MG capsule Take 2,000 mg by mouth daily.      . citalopram (CELEXA) 10 MG tablet Take 1 tablet (10 mg total) by mouth daily. 30 tablet 3  . Coenzyme Q10 200 MG capsule Take 200 mg by mouth daily.    . CRESTOR 10 MG tablet TAKE 1 TABLET (10 MG TOTAL) BY MOUTH DAILY. TAKE 1 TABLET ON SATURDAY AND 1/2 TAB DAILY 30 tablet 6  . esomeprazole (NEXIUM) 40 MG capsule Take 40 mg by mouth daily at 12 noon.    . gabapentin (NEURONTIN) 300 MG capsule Take 1 capsule (300 mg total) by mouth 2 (two) times daily as needed. (Patient taking differently: Take 300 mg by mouth at bedtime. ) 1809 capsule 2  . hydrochlorothiazide (HYDRODIURIL) 25 MG tablet TAKE 1 TABLET (25 MG TOTAL) BY MOUTH DAILY. 90 tablet 1  . levothyroxine (SYNTHROID, LEVOTHROID) 112 MCG tablet Take 1 tablet (112 mcg total) by mouth daily before breakfast. 90 tablet 1  . mupirocin ointment (BACTROBAN) 2 % Apply twice daily to bridge of the nose. 22 g 0  . Omega-3 Fatty Acids (FISH OIL) 1200 MG CAPS Take 2,400 mg by mouth daily.     . traMADol (ULTRAM) 50 MG tablet Take 2 tablets (100 mg total) by mouth every 6 (six) hours as needed for moderate pain or severe pain. 120 tablet 0  . atenolol (TENORMIN) 25 MG tablet TAKE 1 TABLET BY MOUTH EVERY DAY 30 tablet 6  . hydrochlorothiazide (HYDRODIURIL) 25 MG tablet TAKE 1 TABLET (25 MG TOTAL) BY MOUTH DAILY. 30 tablet 6   No facility-administered medications prior to visit.    Allergies  Allergen Reactions  . Lipitor [Atorvastatin] Swelling  . Metronidazole Swelling  . Penicillins Swelling  . Pregabalin Other (See Comments)    REACTION: Somnolence and dizziness  . Simvastatin Other (See Comments)    Leg pain    Review of Systems  Constitutional: Negative for fever and malaise/fatigue.  HENT: Negative for congestion.   Eyes: Negative for discharge.  Respiratory: Negative for shortness of breath.   Cardiovascular: Negative for chest pain, palpitations and  leg swelling.  Gastrointestinal: Negative for nausea and abdominal pain.  Genitourinary: Negative for dysuria.  Musculoskeletal: Negative for falls.  Skin: Negative for rash.  Neurological: Positive for tremors. Negative for loss of consciousness and headaches.  Endo/Heme/Allergies: Negative for environmental allergies.  Psychiatric/Behavioral: Negative for depression. The patient is not nervous/anxious.        Objective:  Physical Exam  Constitutional: She is oriented to person, place, and time. She appears well-developed and well-nourished. No distress.  HENT:  Head: Normocephalic and atraumatic.  Nose: Nose normal.  Eyes: Right eye exhibits no discharge. Left eye exhibits no discharge.  Neck: Normal range of motion. Neck supple.  Cardiovascular: Normal rate and regular rhythm.   No murmur heard. Pulmonary/Chest: Effort normal and breath sounds normal.  Abdominal: Soft. Bowel sounds are normal. She exhibits distension. There is no tenderness.  Musculoskeletal: She exhibits no edema.  Neurological: She is alert and oriented to person, place, and time.  Mild tremor of head  Skin: Skin is warm and dry.  Psychiatric: She has a normal mood and affect.  Nursing note and vitals reviewed.   BP 122/78 mmHg  Pulse 67  Temp(Src) 98.5 F (36.9 C) (Oral)  Ht 5' 4" (1.626 m)  Wt 176 lb (79.833 kg)  BMI 30.20 kg/m2  SpO2 96% Wt Readings from Last 3 Encounters:  06/15/15 176 lb (79.833 kg)  03/09/15 180 lb 8 oz (81.874 kg)  01/04/15 181 lb (82.101 kg)     Lab Results  Component Value Date   WBC 10.0 06/15/2015   HGB 15.2* 06/15/2015   HCT 45.2 06/15/2015   PLT 209.0 06/15/2015   GLUCOSE 128* 06/15/2015   CHOL 176 06/15/2015   TRIG 171.0* 06/15/2015   HDL 43.10 06/15/2015   LDLDIRECT 123.0 03/09/2015   LDLCALC 99 06/15/2015   ALT 22 06/15/2015   AST 24 06/15/2015   NA 141 06/15/2015   K 3.8 06/15/2015   CL 102 06/15/2015   CREATININE 0.87 06/15/2015   BUN 17  06/15/2015   CO2 30 06/15/2015   TSH 0.39 06/15/2015   INR 1.06 06/23/2013   HGBA1C 5.7 06/15/2015   MICROALBUR 0.2 04/27/2014    Lab Results  Component Value Date   TSH 0.39 06/15/2015   Lab Results  Component Value Date   WBC 10.0 06/15/2015   HGB 15.2* 06/15/2015   HCT 45.2 06/15/2015   MCV 89.2 06/15/2015   PLT 209.0 06/15/2015   Lab Results  Component Value Date   NA 141 06/15/2015   K 3.8 06/15/2015   CO2 30 06/15/2015   GLUCOSE 128* 06/15/2015   BUN 17 06/15/2015   CREATININE 0.87 06/15/2015   BILITOT 0.7 06/15/2015   ALKPHOS 57 06/15/2015   AST 24 06/15/2015   ALT 22 06/15/2015   PROT 7.4 06/15/2015   ALBUMIN 4.2 06/15/2015   CALCIUM 10.0 06/15/2015   ANIONGAP 9 09/14/2014   GFR 68.18 06/15/2015   Lab Results  Component Value Date   CHOL 176 06/15/2015   Lab Results  Component Value Date   HDL 43.10 06/15/2015   Lab Results  Component Value Date   LDLCALC 99 06/15/2015   Lab Results  Component Value Date   TRIG 171.0* 06/15/2015   Lab Results  Component Value Date   CHOLHDL 4 06/15/2015   Lab Results  Component Value Date   HGBA1C 5.7 06/15/2015       Assessment & Plan:   Problem List Items Addressed This Visit    Essential hypertension (Chronic)    Well controlled, no changes to meds. Encouraged heart healthy diet such as the DASH diet and exercise as tolerated.       Relevant Medications   montelukast (SINGULAIR) 10 MG tablet   Other Relevant Orders   TSH (Completed)   CBC (Completed)   Lipid panel (Completed)   Comprehensive metabolic panel (  Completed)   Hemoglobin A1c (Completed)   Hepatitis C Antibody (Completed)   GERD (Chronic)    Avoid offending foods, start probiotics. Do not eat large meals in late evening and consider raising head of bed.       Relevant Medications   montelukast (SINGULAIR) 10 MG tablet   Other Relevant Orders   TSH (Completed)   CBC (Completed)   Lipid panel (Completed)   Comprehensive  metabolic panel (Completed)   Hemoglobin A1c (Completed)   Hepatitis C Antibody (Completed)   Hyperlipidemia (Chronic)    Intolerant to statins historically but agrees to Crestor. Encouraged heart healthy diet, increase exercise, avoid trans fats, consider a krill oil cap daily      Relevant Medications   montelukast (SINGULAIR) 10 MG tablet   Other Relevant Orders   TSH (Completed)   CBC (Completed)   Lipid panel (Completed)   Comprehensive metabolic panel (Completed)   Hemoglobin A1c (Completed)   Hepatitis C Antibody (Completed)   Hypothyroidism (Chronic)    On Levothyroxine, continue to monitor      Relevant Medications   montelukast (SINGULAIR) 10 MG tablet   Other Relevant Orders   TSH (Completed)   CBC (Completed)   Lipid panel (Completed)   Comprehensive metabolic panel (Completed)   Hemoglobin A1c (Completed)   Hepatitis C Antibody (Completed)   Obesity (Chronic)    Encouraged DASH diet, decrease po intake and increase exercise as tolerated. Needs 7-8 hours of sleep nightly. Avoid trans fats, eat small, frequent meals every 4-5 hours with lean proteins, complex carbs and healthy fats. Minimize simple carbs, GMO foods.      Tremor (Chronic)    Worsening most notable in head and right leg, no falls or disability from it but she is getting concerned will refer to neurology      Relevant Medications   montelukast (SINGULAIR) 10 MG tablet   Other Relevant Orders   TSH (Completed)   CBC (Completed)   Lipid panel (Completed)   Comprehensive metabolic panel (Completed)   Hemoglobin A1c (Completed)   Hepatitis C Antibody (Completed)    Other Visit Diagnoses    Encounter for immunization    -  Primary    Allergic rhinitis, unspecified allergic rhinitis type        Relevant Medications    montelukast (SINGULAIR) 10 MG tablet    Other Relevant Orders    TSH (Completed)    CBC (Completed)    Lipid panel (Completed)    Comprehensive metabolic panel (Completed)     Hemoglobin A1c (Completed)    Hepatitis C Antibody (Completed)    Hyperglycemia        Relevant Orders    Hemoglobin A1c (Completed)       I am having Ms. Burling start on montelukast. I am also having her maintain her Cinnamon, Fish Oil, Calcium Carbonate-Vit D-Min (CALCIUM 1200 PO), Vitamin D, Coenzyme Q10, gabapentin, esomeprazole, aspirin EC, CRESTOR, traMADol, BESIVANCE, mupirocin ointment, atenolol, hydrochlorothiazide, levothyroxine, and citalopram.  Meds ordered this encounter  Medications  . montelukast (SINGULAIR) 10 MG tablet    Sig: Take 1 tablet (10 mg total) by mouth at bedtime.    Dispense:  30 tablet    Refill:  3     Penni Homans, MD

## 2015-06-24 NOTE — Assessment & Plan Note (Signed)
Well controlled, no changes to meds. Encouraged heart healthy diet such as the DASH diet and exercise as tolerated.

## 2015-06-24 NOTE — Assessment & Plan Note (Addendum)
Intolerant to statins historically but agrees to [employer]. Encouraged heart healthy diet, increase exercise, avoid trans fats, consider a krill oil cap daily

## 2015-06-24 NOTE — Assessment & Plan Note (Signed)
Avoid offending foods, start probiotics. Do not eat large meals in late evening and consider raising head of bed.  

## 2015-06-24 NOTE — Assessment & Plan Note (Signed)
Encouraged DASH diet, decrease po intake and increase exercise as tolerated. Needs 7-8 hours of sleep nightly. Avoid trans fats, eat small, frequent meals every 4-5 hours with lean proteins, complex carbs and healthy fats. Minimize simple carbs, GMO foods. 

## 2015-06-29 ENCOUNTER — Other Ambulatory Visit: Payer: Self-pay | Admitting: Family Medicine

## 2015-07-09 ENCOUNTER — Telehealth: Payer: Self-pay | Admitting: Family Medicine

## 2015-07-09 ENCOUNTER — Other Ambulatory Visit: Payer: Self-pay | Admitting: Family Medicine

## 2015-07-09 DIAGNOSIS — R251 Tremor, unspecified: Secondary | ICD-10-CM

## 2015-07-09 NOTE — Telephone Encounter (Signed)
Relation to PV:XYIA Call back [phone]   Reason for call:  Patient checking on the status of neuro referral , patient preference appointment date 07/25/2015 in the afternoon due to transportation issues.

## 2015-07-12 DIAGNOSIS — Z1211 Encounter for screening for malignant neoplasm of colon: Secondary | ICD-10-CM | POA: Diagnosis not present

## 2015-07-12 DIAGNOSIS — Z1212 Encounter for screening for malignant neoplasm of rectum: Secondary | ICD-10-CM | POA: Diagnosis not present

## 2015-07-12 LAB — COLOGUARD: Cologuard: NEGATIVE

## 2015-07-16 ENCOUNTER — Telehealth: Payer: Self-pay | Admitting: Family Medicine

## 2015-07-16 NOTE — Telephone Encounter (Signed)
OK to give order for [facility] for colon cancer screen

## 2015-07-16 NOTE — Telephone Encounter (Signed)
Called [facility] and clarify which information they needed.

## 2015-07-16 NOTE — Telephone Encounter (Signed)
Caller name:  [facility]  Call back number: [phone]  Pharmacy:  Reason for call:  Requesting verbal orders for [facility] colon cancer screening test

## 2015-07-23 ENCOUNTER — Ambulatory Visit (INDEPENDENT_AMBULATORY_CARE_PROVIDER_SITE_OTHER): Payer: Medicare Other | Admitting: Neurology

## 2015-07-23 ENCOUNTER — Encounter: Payer: Self-pay | Admitting: Neurology

## 2015-07-23 ENCOUNTER — Other Ambulatory Visit (INDEPENDENT_AMBULATORY_CARE_PROVIDER_SITE_OTHER): Payer: Medicare Other

## 2015-07-23 VITALS — BP 126/88 | HR 70 | Ht 64.0 in | Wt 174.0 lb

## 2015-07-23 DIAGNOSIS — G609 Hereditary and idiopathic neuropathy, unspecified: Secondary | ICD-10-CM | POA: Diagnosis not present

## 2015-07-23 DIAGNOSIS — G250 Essential tremor: Secondary | ICD-10-CM

## 2015-07-23 LAB — FOLATE: FOLATE: 18.8 ng/mL (ref >5.9)

## 2015-07-23 LAB — VITAMIN B12: VITAMIN B 12: 214 pg/mL (ref 211–911)

## 2015-07-23 NOTE — Telephone Encounter (Signed)
Received results today 07/23/2015 from [facility].  Results were negative. Abstracted results and mailed a copy to the patient/scan too. Called the patient left msg. To call back.

## 2015-07-23 NOTE — Telephone Encounter (Signed)
Patient informed of results/copy mailed to her as well.

## 2015-07-23 NOTE — Progress Notes (Signed)
Kimberly Irwin was seen today in the movement disorders clinic for neurologic consultation at the request of Penni Homans, MD.  The consultation is for the evaluation of tremor.  This has been noted in her medical records at least since 08/2013 per my review of her chart.  She is right hand dominant but tremor primarily involves the head and she states that it has been going on since 1966.  She states that she was started on inderal back in the 1960's/1970's but then insurance wouldn't pay for it so it was changed to atenolol 50 mg.  She doesn't think that it necessarily helps.  It gets worse with crowds or with stress.  Putting her hands under the chin helps it to be still.  Sometimes if she is reading it will throw off the sight but she thinks that it bothers her daughter more than it bothers her.  No family hx of tremor.  No tremor in arms or legs.  No significant neck pain.  No problems with turning the head when she is driving.  Her balance isn't great but doesn't have falls.  She wonders why she feels off balance.  No paresthesias of feet/legs.      ALLERGIES:   Allergies  Allergen Reactions  . Lipitor [Atorvastatin] Swelling  . Metronidazole Swelling  . Penicillins Swelling  . Pregabalin Other (See Comments)    REACTION: Somnolence and dizziness  . Simvastatin Other (See Comments)    Leg pain    CURRENT MEDICATIONS:  Outpatient Encounter Prescriptions as of 07/23/2015  Medication Sig  . aspirin EC 81 MG tablet Take 81 mg by mouth daily.  Marland Kitchen atenolol (TENORMIN) 50 MG tablet Take 1 tablet (50 mg total) by mouth daily.  Marland Kitchen BESIVANCE 0.6 % SUSP   . Calcium Carbonate-Vit D-Min (CALCIUM 1200 PO) Take 1,200 mg by mouth daily.   . Cholecalciferol (VITAMIN D) 2000 UNITS tablet Take 2,000 Units by mouth daily.  . Cinnamon 500 MG capsule Take 2,000 mg by mouth daily.    . citalopram (CELEXA) 10 MG tablet TAKE 1 TABLET (10 MG TOTAL) BY MOUTH DAILY.  Marland Kitchen Coenzyme Q10 200 MG capsule Take 200 mg  by mouth daily.  . CRESTOR 10 MG tablet TAKE 1 TABLET (10 MG TOTAL) BY MOUTH DAILY. TAKE 1 TABLET ON SATURDAY AND 1/2 TAB DAILY  . esomeprazole (NEXIUM) 40 MG capsule Take 40 mg by mouth daily at 12 noon.  . gabapentin (NEURONTIN) 300 MG capsule Take 1 capsule (300 mg total) by mouth 2 (two) times daily as needed. (Patient taking differently: Take 300 mg by mouth at bedtime. )  . hydrochlorothiazide (HYDRODIURIL) 25 MG tablet TAKE 1 TABLET (25 MG TOTAL) BY MOUTH DAILY.  Marland Kitchen levothyroxine (SYNTHROID, LEVOTHROID) 112 MCG tablet Take 1 tablet (112 mcg total) by mouth daily before breakfast.  . montelukast (SINGULAIR) 10 MG tablet Take 1 tablet (10 mg total) by mouth at bedtime.  . mupirocin ointment (BACTROBAN) 2 % Apply twice daily to bridge of the nose.  . Omega-3 Fatty Acids (FISH OIL) 1200 MG CAPS Take 2,400 mg by mouth daily.   . traMADol (ULTRAM) 50 MG tablet Take 2 tablets (100 mg total) by mouth every 6 (six) hours as needed for moderate pain or severe pain.   No facility-administered encounter medications on file as of 07/23/2015.    PAST MEDICAL HISTORY:   Past Medical History  Diagnosis Date  . Sarcoidosis ([place])   . Hyperlipidemia   . GERD (  gastroesophageal reflux disease)   . Hypertension   . Chronic low back pain   . DDD (degenerative disc disease)     L3-4 with facet arthropathy and stenosis  . Degenerative spondylolisthesis     L4-5 grade 1 with stenosis  . Acute hip pain 10/26/2012  . Bursitis of left hip 10/26/2012  . Pain in joint, lower leg 10/26/2012    left   . Tremors of nervous system   . Hypothyroidism   . Heart murmur     as an infant  . Pneumonia     as a child several times.   . History of hiatal hernia     PAST SURGICAL HISTORY:   Past Surgical History  Procedure Laterality Date  . Carpal tunnel release Bilateral   . Thyroidectomy    . Back surgery  01/15/09    L3-4 and L4-5 decompressive laminectomy with bilateral L3, L4, and L5 decompressive  foraminotomies, more than it would be required for simple interbody fusion alone.  . Back surgery  01/15/09    L3-4 and L4-5 posterior lumbar interbody fusion utilizing tanget interbody allograft wedge, Telamon interbody PEEk cage, and local autografting.  . Back surgery  01/15/09    L3, L4, and L5 posterolateral arthrodesis using segmental pedicle screw fixation and local autografting.  . Joint replacement  09/13/09    Right Hip, Dr. Alvan Dame  . Total hip arthroplasty Left 06/28/2013    Procedure: LEFT TOTAL  HIP ARTHROPLASTY ANTERIOR APPROACH;  Surgeon: Mauri Pole, MD;  Location: [facility];  Service: Orthopedics;  Laterality: Left;  Marland Kitchen Eye surgery      lens implant  . Colonoscopy    . Esophagogastroduodenoscopy    . Cardiac catheterization    . Anterior lat lumbar fusion Left 08/29/2014    Procedure: EXTREME LEFT LATERAL INTERBODY FUSION LUMBAR TWO-THREE LATERAL PLATE;  Surgeon: Charlie Pitter, MD;  Location: [place] [facility];  Service: Neurosurgery;  Laterality: Left;  Marland Kitchen Kyphoplasty N/A 09/14/2014    Procedure: Lumbar Two Kyphoplasty/Vertebroplasty;  Surgeon: Charlie Pitter, MD;  Location: [place] [facility];  Service: Neurosurgery;  Laterality: N/A;  Lumbar Two Kyphoplasty/Vertebroplasty    SOCIAL HISTORY:   Social History   Social History  . Marital Status: Widowed    Spouse Name: N/A  . Number of Children: 2  . Years of Education: N/A   Occupational History  . Not on file.   Social History Main Topics  . Smoking status: Former Smoker -- 0.50 packs/day for 32 years    Types: Cigarettes    Quit date: 08/04/1993  . Smokeless tobacco: Never Used  . Alcohol Use: No  . Drug Use: No  . Sexual Activity: Not on file   Other Topics Concern  . Not on file   Social History Narrative   Married 42 years and has 2 children (2 daughters)   Alcohol Use - no   Former Smoker - she quit 10 years ago, started when she was 37 and has had varying level of use of tobacco products from 1/2 pack to 3 packs per  day.    FAMILY HISTORY:   Family Status  Relation Status Death Age  . Mother Deceased     ROS:  Some baseline SOB with sarcoidosis.  A complete 10 system review of systems was obtained and was unremarkable apart from what is mentioned above.  PHYSICAL EXAMINATION:    VITALS:   Filed Vitals:   07/23/15 1342  BP: 126/88  Pulse:  70  Height: _0  (1.626 m)  Weight: 174 lb (78.926 kg)    GEN:  The patient appears stated age and is in NAD. HEENT:  Normocephalic, atraumatic.  The mucous membranes are moist. The superficial temporal arteries are without ropiness or tenderness. CV:  RRR Lungs:  CTAB Neck/HEME:  There are no carotid bruits bilaterally.  Neurological examination:  Orientation: The patient is alert and oriented x3. Fund of knowledge is appropriate.  Recent and remote memory are intact.  Attention and concentration are normal.    Able to name objects and repeat phrases. Cranial nerves: There is good facial symmetry. Pupils are equal round and reactive to light bilaterally. Fundoscopic exam reveals clear margins bilaterally. Extraocular muscles are intact. The visual fields are full to confrontational testing. The speech is fluent and clear. Soft palate rises symmetrically and there is no tongue deviation. Hearing is intact to conversational tone. Sensation: Sensation is intact to light and pinprick throughout (facial, trunk, extremities). Vibration is decreased at the bilateral big toe. There is no extinction with double simultaneous stimulation. There is no sensory dermatomal level identified. Motor: Strength is 5/5 in the bilateral upper and lower extremities.   Shoulder shrug is equal and symmetric.  There is no pronator drift. Deep tendon reflexes: Deep tendon reflexes are 2+/4 at the bilateral biceps, triceps, brachioradialis, patella and achilles. Plantar responses are downgoing bilaterally.  Movement examination: Tone: There is normal tone in the bilateral upper  extremities.  The tone in the lower extremities is normal.  Abnormal movements: There is a head tremor in the "no" direction.  There is no null point.  There is a resting tremor of the left lower extremity.  There is no tremor of the hands, either at rest or outstretched. Coordination:  There is no decremation with RAM's, with any form of RAMS, including alternating supination and pronation of the forearm, hand opening and closing, finger taps, heel taps and toe taps. Gait and Station: The patient has no difficulty arising out of a deep-seated chair without the use of the hands. The patient's stride length is normal, with a wide-based gait.  She is unable to ambulate in a tandem fashion.  She is able to stand in the Romberg position, although she sways somewhat.    Labs:  No results found for: VITAMINB12   Lab Results  Component Value Date   HGBA1C 5.7 06/15/2015   Lab Results  Component Value Date   TSH 0.39 06/15/2015   Lab Results  Component Value Date   WBC 10.0 06/15/2015   HGB 15.2* 06/15/2015   HCT 45.2 06/15/2015   MCV 89.2 06/15/2015   PLT 209.0 06/15/2015      ASSESSMENT/PLAN:  1.  Essential tremor, involving primarily the head  -I did not see any evidence of cervical dystonia.  There was no null point.  She has had head tremor since the 1960s.  She has minor leg tremor in the left leg.  I told her that generally head tremor is quite resistant to oral medications and is very difficult to treat.  Really, this does not bother her particularly.  It is more bothersome to her family.  I talked to her about medications, but told her that usually I do not recommend oral medications just for head tremor as I usually causes more side effects than do good.  She agreed.  I did tell her about surgical options, but she was not interested. 2.  Gait instability  -This is likely  due to peripheral neuropathy.  She definitely had some evidence of this on examination today.  She is not  diabetic and does not drink alcohol.  I will do a few labs including B12, folate, RPR, SPEP/UPEP with immunofixation to make sure we are not missing other causes.  We talked about the importance of exercise and balance therapy.  We talked about safety associated with peripheral neuropathy.  We talked about the fact that it is not reversible and being mindful and safety is really the best thing. 3.  Follow up is anticipated in the next few months, sooner should new neurologic issues arise.  Much greater than 50% of this visit was spent in counseling with the patient.  Total face to face time:  45 min

## 2015-07-23 NOTE — Patient Instructions (Signed)
Your provider has requested that you have labwork completed today. Please go to [facility] ([id number]) on the second floor of this building before leaving the office today. You do not need to check in. If you are not called within 15 minutes please check with the front desk.

## 2015-07-24 ENCOUNTER — Telehealth: Payer: Self-pay | Admitting: Neurology

## 2015-07-24 LAB — RPR

## 2015-07-24 NOTE — Telephone Encounter (Signed)
-----  Message from Phillips, DO sent at 07/24/2015  7:49 AM EST ----- Let pt know that her B12 is a bit low and I would like to see over 400.  Start oral B12 1054mg daily.  I know that she isn't coming back here so recommend she have PCP recheck in 3-4 months

## 2015-07-24 NOTE — Telephone Encounter (Signed)
Patient made aware.

## 2015-07-25 ENCOUNTER — Ambulatory Visit: Payer: PRIVATE HEALTH INSURANCE | Admitting: Neurology

## 2015-07-25 ENCOUNTER — Encounter (INDEPENDENT_AMBULATORY_CARE_PROVIDER_SITE_OTHER): Payer: Medicare Other | Admitting: Ophthalmology

## 2015-07-25 DIAGNOSIS — H35033 Hypertensive retinopathy, bilateral: Secondary | ICD-10-CM

## 2015-07-25 DIAGNOSIS — I10 Essential (primary) hypertension: Secondary | ICD-10-CM

## 2015-07-25 DIAGNOSIS — H34832 Tributary (branch) retinal vein occlusion, left eye, with macular edema: Secondary | ICD-10-CM | POA: Diagnosis not present

## 2015-07-25 DIAGNOSIS — H43813 Vitreous degeneration, bilateral: Secondary | ICD-10-CM

## 2015-07-26 LAB — UIFE/LIGHT CHAINS/TP QN, 24-HR UR: ALBUMIN, U: DETECTED

## 2015-08-01 ENCOUNTER — Encounter: Payer: Self-pay | Admitting: *Deleted

## 2015-08-11 ENCOUNTER — Other Ambulatory Visit: Payer: Self-pay | Admitting: Family Medicine

## 2015-08-27 ENCOUNTER — Other Ambulatory Visit: Payer: Self-pay | Admitting: Family Medicine

## 2015-08-27 MED ORDER — CITALOPRAM HYDROBROMIDE 10 MG PO TABS: ORAL_TABLET | ORAL | Status: DC

## 2015-09-04 ENCOUNTER — Other Ambulatory Visit: Payer: Self-pay | Admitting: Family Medicine

## 2015-09-06 ENCOUNTER — Encounter: Payer: Self-pay | Admitting: Neurology

## 2015-10-11 ENCOUNTER — Ambulatory Visit (INDEPENDENT_AMBULATORY_CARE_PROVIDER_SITE_OTHER): Payer: Medicare Other

## 2015-10-11 VITALS — BP 144/64 | HR 58 | Ht 64.0 in | Wt 173.4 lb

## 2015-10-11 DIAGNOSIS — Z0000 Encounter for general adult medical examination without abnormal findings: Secondary | ICD-10-CM | POA: Diagnosis not present

## 2015-10-11 MED ORDER — CITALOPRAM HYDROBROMIDE 10 MG PO TABS: ORAL_TABLET | ORAL | Status: DC

## 2015-10-11 NOTE — Progress Notes (Signed)
Pre visit review using our clinic review tool, if applicable. No additional management support is needed unless otherwise documented below in the visit note.

## 2015-10-11 NOTE — Patient Instructions (Addendum)
Continue doing brain stimulating activities (puzzles, reading, adult coloring books, staying active) to keep memory sharp.   Eat heart healthy diet (full of fruits, vegetables, whole grains, lean protein, water--limit caffeine, salt, fat, and sugar intake) and increase physical activity as tolerated.  Follow up with Dr. Charlett Blake.    Fall Prevention in the Home  Falls can cause injuries. They can happen to people of all ages. There are many things you can do to make your home safe and to help prevent falls.  WHAT CAN I DO ON THE OUTSIDE OF MY HOME?  Regularly fix the edges of walkways and driveways and fix any cracks.  Remove anything that might make you trip as you walk through a door, such as a raised step or threshold.  Trim any bushes or trees on the path to your home.  Use bright outdoor lighting.  Clear any walking paths of anything that might make someone trip, such as rocks or tools.  Regularly check to see if handrails are loose or broken. Make sure that both sides of any steps have handrails.  Any raised decks and porches should have guardrails on the edges.  Have any leaves, snow, or ice cleared regularly.  Use sand or salt on walking paths during winter.  Clean up any spills in your garage right away. This includes oil or grease spills. WHAT CAN I DO IN THE BATHROOM?   Use night lights.  Install grab bars by the toilet and in the tub and shower. Do not use towel bars as grab bars.  Use non-skid mats or decals in the tub or shower.  If you need to sit down in the shower, use a plastic, non-slip stool.  Keep the floor dry. Clean up any water that spills on the floor as soon as it happens.  Remove soap buildup in the tub or shower regularly.  Attach bath mats securely with double-sided non-slip rug tape.  Do not have throw rugs and other things on the floor that can make you trip. WHAT CAN I DO IN THE BEDROOM?  Use night lights.  Make sure that you have a light  by your bed that is easy to reach.  Do not use any sheets or blankets that are too big for your bed. They should not hang down onto the floor.  Have a firm chair that has side arms. You can use this for support while you get dressed.  Do not have throw rugs and other things on the floor that can make you trip. WHAT CAN I DO IN THE KITCHEN?  Clean up any spills right away.  Avoid walking on wet floors.  Keep items that you use a lot in easy-to-reach places.  If you need to reach something above you, use a strong step stool that has a grab bar.  Keep electrical cords out of the way.  Do not use floor polish or wax that makes floors slippery. If you must use wax, use non-skid floor wax.  Do not have throw rugs and other things on the floor that can make you trip. WHAT CAN I DO WITH MY STAIRS?  Do not leave any items on the stairs.  Make sure that there are handrails on both sides of the stairs and use them. Fix handrails that are broken or loose. Make sure that handrails are as long as the stairways.  Check any carpeting to make sure that it is firmly attached to the stairs. Fix any carpet that  is loose or worn.  Avoid having throw rugs at the top or bottom of the stairs. If you do have throw rugs, attach them to the floor with carpet tape.  Make sure that you have a light switch at the top of the stairs and the bottom of the stairs. If you do not have them, ask someone to add them for you. WHAT ELSE CAN I DO TO HELP PREVENT FALLS?  Wear shoes that:  Do not have high heels.  Have rubber bottoms.  Are comfortable and fit you well.  Are closed at the toe. Do not wear sandals.  If you use a stepladder:  Make sure that it is fully opened. Do not climb a closed stepladder.  Make sure that both sides of the stepladder are locked into place.  Ask someone to hold it for you, if possible.  Clearly mark and make sure that you can see:  Any grab bars or handrails.  First  and last steps.  Where the edge of each step is.  Use tools that help you move around (mobility aids) if they are needed. These include:  Canes.  Walkers.  Scooters.  Crutches.  Turn on the lights when you go into a dark area. Replace any light bulbs as soon as they burn out.  Set up your furniture so you have a clear path. Avoid moving your furniture around.  If any of your floors are uneven, fix them.  If there are any pets around you, be aware of where they are.  Review your medicines with your doctor. Some medicines can make you feel dizzy. This can increase your chance of falling. Ask your doctor what other things that you can do to help prevent falls.   This information is not intended to replace advice given to you by your health care provider. Make sure you discuss any questions you have with your health care provider.   Document Released: 05/17/2009 Document Revised: 12/05/2014 Document Reviewed: 08/25/2014 Elsevier Interactive Patient Education 2016 [place] Maintenance, Female Adopting a healthy lifestyle and getting preventive care can go a long way to promote health and wellness. Talk with your health care provider about what schedule of regular examinations is right for you. This is a good chance for you to check in with your provider about disease prevention and staying healthy. In between checkups, there are plenty of things you can do on your own. Experts have done a lot of research about which lifestyle changes and preventive measures are most likely to keep you healthy. Ask your health care provider for more information. WEIGHT AND DIET  Eat a healthy diet  Be sure to include plenty of vegetables, fruits, low-fat dairy products, and lean protein.  Do not eat a lot of foods high in solid fats, added sugars, or salt.  Get regular exercise. This is one of the most important things you can do for your health.  Most adults should exercise for at  least 150 minutes each week. The exercise should increase your heart rate and make you sweat (moderate-intensity exercise).  Most adults should also do strengthening exercises at least twice a week. This is in addition to the moderate-intensity exercise.  Maintain a healthy weight  Body mass index (BMI) is a measurement that can be used to identify possible weight problems. It estimates body fat based on height and weight. Your health care provider can help determine your BMI and help you achieve or maintain a healthy  weight.  For females 67 years of age and older:   A BMI below 18.5 is considered underweight.  A BMI of 18.5 to 24.9 is normal.  A BMI of 25 to 29.9 is considered overweight.  A BMI of 30 and above is considered obese.  Watch levels of cholesterol and blood lipids  You should start having your blood tested for lipids and cholesterol at 72 years of age, then have this test every 5 years.  You may need to have your cholesterol levels checked more often if:  Your lipid or cholesterol levels are high.  You are older than 72 years of age.  You are at high risk for heart disease.  CANCER SCREENING   Lung Cancer  Lung cancer screening is recommended for adults 57-69 years old who are at high risk for lung cancer because of a history of smoking.  A yearly low-dose CT scan of the lungs is recommended for people who:  Currently smoke.  Have quit within the past 15 years.  Have at least a 30-pack-year history of smoking. A pack year is smoking an average of one pack of cigarettes a day for 1 year.  Yearly screening should continue until it has been 15 years since you quit.  Yearly screening should stop if you develop a health problem that would prevent you from having lung cancer treatment.  Breast Cancer  Practice breast self-awareness. This means understanding how your breasts normally appear and feel.  It also means doing regular breast self-exams. Let your  health care provider know about any changes, no matter how small.  If you are in your 20s or 30s, you should have a clinical breast exam (CBE) by a health care provider every 1-3 years as part of a regular health exam.  If you are 12 or older, have a CBE every year. Also consider having a breast X-ray (mammogram) every year.  If you have a family history of breast cancer, talk to your health care provider about genetic screening.  If you are at high risk for breast cancer, talk to your health care provider about having an MRI and a mammogram every year.  Breast cancer gene (BRCA) assessment is recommended for women who have family members with BRCA-related cancers. BRCA-related cancers include:  Breast.  Ovarian.  Tubal.  Peritoneal cancers.  Results of the assessment will determine the need for genetic counseling and BRCA1 and BRCA2 testing. Cervical Cancer Your health care provider may recommend that you be screened regularly for cancer of the pelvic organs (ovaries, uterus, and vagina). This screening involves a pelvic examination, including checking for microscopic changes to the surface of your cervix (Pap test). You may be encouraged to have this screening done every 3 years, beginning at age 107.  For women ages 94-65, health care providers may recommend pelvic exams and Pap testing every 3 years, or they may recommend the Pap and pelvic exam, combined with testing for human papilloma virus (HPV), every 5 years. Some types of HPV increase your risk of cervical cancer. Testing for HPV may also be done on women of any age with unclear Pap test results.  Other health care providers may not recommend any screening for nonpregnant women who are considered low risk for pelvic cancer and who do not have symptoms. Ask your health care provider if a screening pelvic exam is right for you.  If you have had past treatment for cervical cancer or a condition that could lead to cancer,  you need  Pap tests and screening for cancer for at least 20 years after your treatment. If Pap tests have been discontinued, your risk factors (such as having a new sexual partner) need to be reassessed to determine if screening should resume. Some women have medical problems that increase the chance of getting cervical cancer. In these cases, your health care provider may recommend more frequent screening and Pap tests. Colorectal Cancer  This type of cancer can be detected and often prevented.  Routine colorectal cancer screening usually begins at 73 years of age and continues through 72 years of age.  Your health care provider may recommend screening at an earlier age if you have risk factors for colon cancer.  Your health care provider may also recommend using home test kits to check for hidden blood in the stool.  A small camera at the end of a tube can be used to examine your colon directly (sigmoidoscopy or colonoscopy). This is done to check for the earliest forms of colorectal cancer.  Routine screening usually begins at age 28.  Direct examination of the colon should be repeated every 5-10 years through 72 years of age. However, you may need to be screened more often if early forms of precancerous polyps or small growths are found. Skin Cancer  Check your skin from head to toe regularly.  Tell your health care provider about any new moles or changes in moles, especially if there is a change in a mole's shape or color.  Also tell your health care provider if you have a mole that is larger than the size of a pencil eraser.  Always use sunscreen. Apply sunscreen liberally and repeatedly throughout the day.  Protect yourself by wearing long sleeves, pants, a wide-brimmed hat, and sunglasses whenever you are outside. HEART DISEASE, DIABETES, AND HIGH BLOOD PRESSURE   High blood pressure causes heart disease and increases the risk of stroke. High blood pressure is more likely to develop  in:  People who have blood pressure in the high end of the normal range (130-139/85-89 mm Hg).  People who are overweight or obese.  People who are African American.  If you are 98-75 years of age, have your blood pressure checked every 3-5 years. If you are 30 years of age or older, have your blood pressure checked every year. You should have your blood pressure measured twice--once when you are at a hospital or clinic, and once when you are not at a hospital or clinic. Record the average of the two measurements. To check your blood pressure when you are not at a hospital or clinic, you can use:  An automated blood pressure machine at a pharmacy.  A home blood pressure monitor.  If you are between 85 years and 24 years old, ask your health care provider if you should take aspirin to prevent strokes.  Have regular diabetes screenings. This involves taking a blood sample to check your fasting blood sugar level.  If you are at a normal weight and have a low risk for diabetes, have this test once every three years after 72 years of age.  If you are overweight and have a high risk for diabetes, consider being tested at a younger age or more often. PREVENTING INFECTION  Hepatitis B  If you have a higher risk for hepatitis B, you should be screened for this virus. You are considered at high risk for hepatitis B if:  You were born in a country where hepatitis  B is common. Ask your health care provider which countries are considered high risk.  Your parents were born in a high-risk country, and you have not been immunized against hepatitis B (hepatitis B vaccine).  You have HIV or AIDS.  You use needles to inject street drugs.  You live with someone who has hepatitis B.  You have had sex with someone who has hepatitis B.  You get hemodialysis treatment.  You take certain medicines for conditions, including cancer, organ transplantation, and autoimmune conditions. Hepatitis C  Blood  testing is recommended for:  Everyone born from [birth year] through 1967.  Anyone with known risk factors for hepatitis C. Sexually transmitted infections (STIs)  You should be screened for sexually transmitted infections (STIs) including gonorrhea and chlamydia if:  You are sexually active and are younger than 72 years of age.  You are older than 72 years of age and your health care provider tells you that you are at risk for this type of infection.  Your sexual activity has changed since you were last screened and you are at an increased risk for chlamydia or gonorrhea. Ask your health care provider if you are at risk.  If you do not have HIV, but are at risk, it may be recommended that you take a prescription medicine daily to prevent HIV infection. This is called pre-exposure prophylaxis (PrEP). You are considered at risk if:  You are sexually active and do not regularly use condoms or know the HIV status of your partner(s).  You take drugs by injection.  You are sexually active with a partner who has HIV. Talk with your health care provider about whether you are at high risk of being infected with HIV. If you choose to begin PrEP, you should first be tested for HIV. You should then be tested every 3 months for as long as you are taking PrEP.  PREGNANCY   If you are premenopausal and you may become pregnant, ask your health care provider about preconception counseling.  If you may become pregnant, take 400 to 800 micrograms (mcg) of folic acid every day.  If you want to prevent pregnancy, talk to your health care provider about birth control (contraception). OSTEOPOROSIS AND MENOPAUSE   Osteoporosis is a disease in which the bones lose minerals and strength with aging. This can result in serious bone fractures. Your risk for osteoporosis can be identified using a bone density scan.  If you are 75 years of age or older, or if you are at risk for osteoporosis and fractures, ask your  health care provider if you should be screened.  Ask your health care provider whether you should take a calcium or vitamin D supplement to lower your risk for osteoporosis.  Menopause may have certain physical symptoms and risks.  Hormone replacement therapy may reduce some of these symptoms and risks. Talk to your health care provider about whether hormone replacement therapy is right for you.  HOME CARE INSTRUCTIONS   Schedule regular health, dental, and eye exams.  Stay current with your immunizations.   Do not use any tobacco products including cigarettes, chewing tobacco, or electronic cigarettes.  If you are pregnant, do not drink alcohol.  If you are breastfeeding, limit how much and how often you drink alcohol.  Limit alcohol intake to no more than 1 drink per day for nonpregnant women. One drink equals 12 ounces of beer, 5 ounces of wine, or 1 ounces of hard liquor.  Do not use street  drugs.  Do not share needles.  Ask your health care provider for help if you need support or information about quitting drugs.  Tell your health care provider if you often feel depressed.  Tell your health care provider if you have ever been abused or do not feel safe at home.   This information is not intended to replace advice given to you by your health care provider. Make sure you discuss any questions you have with your health care provider.   Document Released: 02/03/2011 Document Revised: 08/11/2014 Document Reviewed: 06/22/2013 Elsevier Interactive Patient Education [employer].

## 2015-10-11 NOTE — Progress Notes (Signed)
Subjective:   Kimberly Irwin is a 70 y.o. female who presents for Medicare Annual (Subsequent) preventive examination.  Review of Systems: No ROS Cardiac Risk Factors include: advanced age (>44mn, >>42women);hypertension (Hyperlipidemia) Sleep patterns:  Sleeps at least 8 hours per night/wakes up 2-3 times per night.    Home Safety/Smoke Alarms:  Feels safe at home.  Lives with daughter and two dogs in one level home.  Smokes alarms present.   Firearm Safety: Kept in a safe place.    Seat Belt Safety/Bike Helmet:  Always wears seat belt.   Sun exposure:  Limited sun exposure  Counseling:   Eye Exam- 08/2015 Dental- Upper dentures.   Female:  Pap-03/19/11      Mammo-05/23/15-[facility]-negative     Dexa scan-03/25/11     CCS-completed cologuard 07/12/15; chart updated.         Objective:     Vitals: BP 144/64 mmHg  Pulse 58  Ht _0  (1.626 m)  Wt 173 lb 6.4 oz (78.654 kg)  BMI 29.75 kg/m2  SpO2 97%  Tobacco History  Smoking status  . Former Smoker -- 0.50 packs/day for 32 years  . Types: Cigarettes  . Quit date: 07/22/1994  Smokeless tobacco  . Never Used     Counseling given: No   Past Medical History  Diagnosis Date  . [place]   . Hyperlipidemia   . GERD (gastroesophageal reflux disease)   . Hypertension   . Chronic low back pain   . DDD (degenerative disc disease)     L3-4 with facet arthropathy and stenosis  . Degenerative spondylolisthesis     L4-5 grade 1 with stenosis  . Acute hip pain 10/26/2012  . Bursitis of left hip 10/26/2012  . Pain in joint, lower leg 10/26/2012    left   . Tremors of nervous system   . Hypothyroidism   . Heart murmur     as an infant  . Pneumonia     as a child several times.   . History of hiatal hernia    Past Surgical History  Procedure Laterality Date  . Carpal tunnel release Bilateral   . Thyroidectomy    . Back surgery  01/15/09    L3-4 and L4-5 decompressive laminectomy with bilateral L3, L4, and L5  decompressive foraminotomies, more than it would be required for simple interbody fusion alone.  . Back surgery  01/15/09    L3-4 and L4-5 posterior lumbar interbody fusion utilizing tanget interbody allograft wedge, Telamon interbody PEEk cage, and local autografting.  . Back surgery  01/15/09    L3, L4, and L5 posterolateral arthrodesis using segmental pedicle screw fixation and local autografting.  . Joint replacement  09/13/09    Right Hip, Dr. OAlvan Dame . Total hip arthroplasty Left 06/28/2013    Procedure: LEFT TOTAL  HIP ARTHROPLASTY ANTERIOR APPROACH;  Surgeon: MMauri Pole MD;  Location: [facility];  Service: Orthopedics;  Laterality: Left;  .Marland KitchenEye surgery      lens implant  . Colonoscopy    . Esophagogastroduodenoscopy    . Cardiac catheterization    . Anterior lat lumbar fusion Left 08/29/2014    Procedure: EXTREME LEFT LATERAL INTERBODY FUSION LUMBAR TWO-THREE LATERAL PLATE;  Surgeon: HCharlie Pitter MD;  [facility];  Service: Neurosurgery;  Laterality: Left;  .Marland KitchenKyphoplasty N/A 09/14/2014    Procedure: Lumbar Two Kyphoplasty/Vertebroplasty;  Surgeon: HCharlie Pitter MD;  [facility] [facility];  Service: Neurosurgery;  Laterality:  N/A;  Lumbar Two Kyphoplasty/Vertebroplasty   Family History  Problem Relation Age of Onset  . Cancer Mother 36    Breast  . Heart disease Mother   . Hypertension Mother   . Hyperlipidemia Mother   . Heart attack Mother   . Asthma Maternal Grandmother    History  Sexual Activity  . Sexual Activity: Not on file    Outpatient Encounter Prescriptions as of 10/11/2015  Medication Sig  . aspirin EC 81 MG tablet Take 81 mg by mouth daily.  Marland Kitchen atenolol (TENORMIN) 50 MG tablet TAKE 1 TABLET (50 MG TOTAL) BY MOUTH DAILY.  . BESIVANCE 0.6 % SUSP Reported on 07/23/2015  . Calcium Carbonate-Vit D-Min (CALCIUM 1200 PO) Take 1,200 mg by mouth daily.   . Cholecalciferol (VITAMIN D) 2000 UNITS tablet Take 2,000 Units by mouth daily.  . Cinnamon 500 MG capsule  Take 2,000 mg by mouth daily.    . citalopram (CELEXA) 10 MG tablet TAKE 1 TABLET (10 MG TOTAL) BY MOUTH DAILY.  Marland Kitchen Coenzyme Q10 200 MG capsule Take 200 mg by mouth daily.  . CRESTOR 10 MG tablet TAKE 1 TABLET (10 MG TOTAL) BY MOUTH DAILY. TAKE 1 TABLET ON SATURDAY AND 1/2 TAB DAILY (Patient taking differently: TAKE 1 TABLET on Tuesday and Saturday and  1/2 TAB all other days.)  . esomeprazole (NEXIUM) 40 MG capsule Take 40 mg by mouth daily at 12 noon.  . fexofenadine-pseudoephedrine (ALLEGRA-D 24) 180-240 MG 24 hr tablet Take 1 tablet by mouth daily.  Marland Kitchen gabapentin (NEURONTIN) 300 MG capsule Take 1 capsule (300 mg total) by mouth 2 (two) times daily as needed. (Patient taking differently: Take 300 mg by mouth at bedtime. )  . Glucosamine HCl (GLUCOSAMINE PO) Take by mouth daily.  . hydrochlorothiazide (HYDRODIURIL) 25 MG tablet TAKE 1 TABLET (25 MG TOTAL) BY MOUTH DAILY.  Marland Kitchen levothyroxine (SYNTHROID, LEVOTHROID) 112 MCG tablet TAKE 1 TABLET (112 MCG TOTAL) BY MOUTH DAILY BEFORE BREAKFAST.  . mupirocin ointment (BACTROBAN) 2 % Apply twice daily to bridge of the nose.  . Omega-3 Fatty Acids (FISH OIL) 1200 MG CAPS Take 2,400 mg by mouth daily.   . traMADol (ULTRAM) 50 MG tablet Take 2 tablets (100 mg total) by mouth every 6 (six) hours as needed for moderate pain or severe pain.  . vitamin B-12 (CYANOCOBALAMIN) 1000 MCG tablet Take 1,000 mcg by mouth daily.  . [DISCONTINUED] citalopram (CELEXA) 10 MG tablet TAKE 1 TABLET (10 MG TOTAL) BY MOUTH DAILY.  . [DISCONTINUED] citalopram (CELEXA) 10 MG tablet TAKE 1 TABLET (10 MG TOTAL) BY MOUTH DAILY. (Patient not taking: Reported on 10/11/2015)  . [DISCONTINUED] montelukast (SINGULAIR) 10 MG tablet Take 1 tablet (10 mg total) by mouth at bedtime. (Patient not taking: Reported on 10/11/2015)   No facility-administered encounter medications on file as of 10/11/2015.    Activities of Daily Living In your present state of health, do you have any difficulty  performing the following activities: 10/11/2015  Hearing? N  Vision? N  Difficulty concentrating or making decisions? N  Walking or climbing stairs? Y  Dressing or bathing? N  Doing errands, shopping? N  Preparing Food and eating ? N  Using the Toilet? Y  In the past six months, have you accidently leaked urine? N  Do you have problems with loss of bowel control? N  Managing your Medications? N  Managing your Finances? N  Housekeeping or managing your Housekeeping? N    Patient Care Team: Mosie Lukes,  MD as PCP - General (Family Medicine) Elsie Stain, MD as Attending Physician (Pulmonary Disease) Earnie Larsson, MD as Consulting Physician (Neurosurgery) Paralee Cancel, MD as Consulting Physician (Orthopedic Surgery) [place], DO as Consulting Physician (Internal Medicine)    Assessment:  Deferred to PCP.    Exercise Activities and Dietary recommendations Current Exercise Habits: The patient has a physically strenous job, but has no regular exercise apart from work.;Home exercise routine, Type of exercise: walking, Time (Minutes): 30, Frequency (Times/Week): 6, Weekly Exercise (Minutes/Week): 180   Diet:  Regular diet.  Eats 2 meals and 1 snack per day.    Goals    . Remain active and independent.        Fall Risk Fall Risk  10/11/2015 04/27/2014  Falls in the past year? No No  Risk for fall due to : Impaired balance/gait -  Risk for fall due to (comments): Pt reports stumbling a lot.  Unable to walk in a straight line.   -   Depression Screen PHQ 2/9 Scores 10/11/2015 04/27/2014  PHQ - 2 Score 0 0     Cognitive Testing MMSE - Mini Mental State Exam 10/11/2015  Orientation to time 5  Orientation to Place 5  Registration 3  Attention/ Calculation 5  Recall 3  Language- name 2 objects 2  Language- repeat 1  Language- follow 3 step command 3  Language- read & follow direction 1  Write a sentence 1  Copy design 1  Total score 30    Immunization History    Administered Date(s) Administered  . Influenza Split 05/08/2011, 05/26/2012  . Influenza Whole 06/03/2006, 05/31/2007, 04/26/2008, 05/23/2009, 05/20/2010  . Influenza, High Dose Seasonal PF 05/23/2013  . Influenza,inj,Quad PF,36+ Mos 04/27/2014, 06/15/2015  . Pneumococcal Conjugate-13 04/27/2014  . Pneumococcal Polysaccharide-23 06/18/2011  . Tdap 06/18/2011   Screening Tests Health Maintenance  Topic Date Due  . INFLUENZA VACCINE  03/04/2016  . MAMMOGRAM  05/22/2017  . Fecal DNA (Cologuard)  07/11/2018  . TETANUS/TDAP  06/17/2021  . DEXA SCAN  Completed  . ZOSTAVAX  Addressed  . Hepatitis C Screening  Completed  . PNA vac Low Risk Adult  Completed      Plan:  Continue doing brain stimulating activities (puzzles, reading, adult coloring books, staying active) to keep memory sharp.   Eat heart healthy diet (full of fruits, vegetables, whole grains, lean protein, water--limit caffeine, salt, fat, and sugar intake) and increase physical activity as tolerated.  Follow up with Dr. Charlett Blake.     During the course of the visit the patient was educated and counseled about the following appropriate screening and preventive services:   Vaccines to include Pneumoccal, Influenza, Hepatitis B, Td, Zostavax, HCV  Electrocardiogram  Cardiovascular Disease  Colorectal cancer screening  Bone density screening  Diabetes screening  Glaucoma screening  Mammography/PAP  Nutrition counseling   Patient Instructions (the written plan) was given to the patient.   Rudene Anda, RN  10/11/2015

## 2015-10-17 ENCOUNTER — Encounter (INDEPENDENT_AMBULATORY_CARE_PROVIDER_SITE_OTHER): Payer: Medicare Other | Admitting: Ophthalmology

## 2015-10-17 DIAGNOSIS — H43813 Vitreous degeneration, bilateral: Secondary | ICD-10-CM

## 2015-10-17 DIAGNOSIS — H35033 Hypertensive retinopathy, bilateral: Secondary | ICD-10-CM | POA: Diagnosis not present

## 2015-10-17 DIAGNOSIS — I10 Essential (primary) hypertension: Secondary | ICD-10-CM

## 2015-10-17 DIAGNOSIS — H34832 Tributary (branch) retinal vein occlusion, left eye, with macular edema: Secondary | ICD-10-CM

## 2015-10-26 ENCOUNTER — Encounter: Payer: Self-pay | Admitting: Family Medicine

## 2015-10-26 ENCOUNTER — Ambulatory Visit (INDEPENDENT_AMBULATORY_CARE_PROVIDER_SITE_OTHER): Payer: Medicare Other | Admitting: Family Medicine

## 2015-10-26 VITALS — BP 140/90 | HR 60 | Temp 98.6°F | Ht 64.0 in | Wt 173.2 lb

## 2015-10-26 DIAGNOSIS — M1990 Unspecified osteoarthritis, unspecified site: Secondary | ICD-10-CM

## 2015-10-26 DIAGNOSIS — R251 Tremor, unspecified: Secondary | ICD-10-CM

## 2015-10-26 DIAGNOSIS — E039 Hypothyroidism, unspecified: Secondary | ICD-10-CM

## 2015-10-26 DIAGNOSIS — G894 Chronic pain syndrome: Secondary | ICD-10-CM

## 2015-10-26 DIAGNOSIS — R739 Hyperglycemia, unspecified: Secondary | ICD-10-CM | POA: Diagnosis not present

## 2015-10-26 DIAGNOSIS — R2689 Other abnormalities of gait and mobility: Secondary | ICD-10-CM

## 2015-10-26 DIAGNOSIS — E785 Hyperlipidemia, unspecified: Secondary | ICD-10-CM

## 2015-10-26 DIAGNOSIS — K219 Gastro-esophageal reflux disease without esophagitis: Secondary | ICD-10-CM | POA: Diagnosis not present

## 2015-10-26 DIAGNOSIS — I10 Essential (primary) hypertension: Secondary | ICD-10-CM | POA: Diagnosis not present

## 2015-10-26 DIAGNOSIS — M519 Unspecified thoracic, thoracolumbar and lumbosacral intervertebral disc disorder: Secondary | ICD-10-CM

## 2015-10-26 DIAGNOSIS — R519 Headache, unspecified: Secondary | ICD-10-CM

## 2015-10-26 DIAGNOSIS — E669 Obesity, unspecified: Secondary | ICD-10-CM

## 2015-10-26 DIAGNOSIS — R2681 Unsteadiness on feet: Secondary | ICD-10-CM

## 2015-10-26 DIAGNOSIS — R51 Headache: Secondary | ICD-10-CM

## 2015-10-26 LAB — COMPREHENSIVE METABOLIC PANEL
ALK PHOS: 49 U/L (ref 39–117)
ALT: 22 U/L (ref 0–35)
AST: 24 U/L (ref 0–37)
Albumin: 4.3 g/dL (ref 3.5–5.2)
BUN: 16 mg/dL (ref 6–23)
CO2: 27 mEq/L (ref 19–32)
Calcium: 10.2 mg/dL (ref 8.4–10.5)
Chloride: 102 mEq/L (ref 96–112)
Creatinine, Ser: 0.86 mg/dL (ref 0.40–1.20)
GFR: 69.03 mL/min (ref >60.00)
GLUCOSE: 118 mg/dL — AB (ref 70–99)
Potassium: 4.6 mEq/L (ref 3.5–5.1)
Sodium: 139 mEq/L (ref 135–145)
TOTAL PROTEIN: 7.7 g/dL (ref 6.0–8.3)
Total Bilirubin: 0.8 mg/dL (ref 0.2–1.2)

## 2015-10-26 LAB — LIPID PANEL
Cholesterol: 191 mg/dL (ref 0–200)
HDL: 41.5 mg/dL (ref >39.00)
LDL Cholesterol: 112 mg/dL — ABNORMAL HIGH (ref 0–99)
NONHDL: 149.88
TRIGLYCERIDES: 191 mg/dL — AB (ref 0.0–149.0)
Total CHOL/HDL Ratio: 5
VLDL: 38.2 mg/dL (ref 0.0–40.0)

## 2015-10-26 LAB — CBC
HCT: 45.4 % (ref 36.0–46.0)
Hemoglobin: 15.5 g/dL — ABNORMAL HIGH (ref 12.0–15.0)
MCHC: 34.2 g/dL (ref 30.0–36.0)
MCV: 88.6 fl (ref 78.0–100.0)
Platelets: 213 K/uL (ref 150.0–400.0)
RBC: 5.12 Mil/uL — ABNORMAL HIGH (ref 3.87–5.11)
RDW: 13.7 % (ref 11.5–15.5)
WBC: 9.8 K/uL (ref 4.0–10.5)

## 2015-10-26 LAB — TSH: TSH: 0.4 u[IU]/mL (ref 0.35–4.50)

## 2015-10-26 LAB — HEMOGLOBIN A1C: HEMOGLOBIN A1C: 5.9 % (ref 4.6–6.5)

## 2015-10-26 MED ORDER — GABAPENTIN 300 MG PO CAPS: 300.0000 mg | ORAL_CAPSULE | Freq: Every day | ORAL | Status: DC

## 2015-10-26 NOTE — Progress Notes (Signed)
Subjective:    Patient ID: Kimberly Irwin, female    DOB: 1946-03-31, 70 y.o.   MRN: [id number]  Chief Complaint  Patient presents with  . Follow-up    HPI Patient is in today for follow up.  Patient having some concerns with gait and balance. She has not suffered any falls, incontinence or increase in pain but she is having trouble with stumbling. She feels she is often pulling to one side or the other when she is walking. No worsening of her tremor. Denies CP/palp/SOB/HA/congestion/fevers/GI or GU c/o. Taking meds as prescribed  Past Medical History  Diagnosis Date  . Sarcoidosis ([place])   . Hyperlipidemia   . GERD (gastroesophageal reflux disease)   . Hypertension   . Chronic low back pain   . DDD (degenerative disc disease)     L3-4 with facet arthropathy and stenosis  . Degenerative spondylolisthesis     L4-5 grade 1 with stenosis  . Acute hip pain 10/26/2012  . Bursitis of left hip 10/26/2012  . Pain in joint, lower leg 10/26/2012    left   . Tremors of nervous system   . Hypothyroidism   . Heart murmur     as an infant  . Pneumonia     as a child several times.   . History of hiatal hernia     Past Surgical History  Procedure Laterality Date  . Carpal tunnel release Bilateral   . Thyroidectomy    . Back surgery  01/15/09    L3-4 and L4-5 decompressive laminectomy with bilateral L3, L4, and L5 decompressive foraminotomies, more than it would be required for simple interbody fusion alone.  . Back surgery  01/15/09    L3-4 and L4-5 posterior lumbar interbody fusion utilizing tanget interbody allograft wedge, Telamon interbody PEEk cage, and local autografting.  . Back surgery  01/15/09    L3, L4, and L5 posterolateral arthrodesis using segmental pedicle screw fixation and local autografting.  . Joint replacement  09/13/09    Right Hip, Dr. Alvan Dame  . Total hip arthroplasty Left 06/28/2013    Procedure: LEFT TOTAL  HIP ARTHROPLASTY ANTERIOR APPROACH;  Surgeon: Mauri Pole,  MD;  Location: [facility];  Service: Orthopedics;  Laterality: Left;  Marland Kitchen Eye surgery      lens implant  . Colonoscopy    . Esophagogastroduodenoscopy    . Cardiac catheterization    . Anterior lat lumbar fusion Left 08/29/2014    Procedure: EXTREME LEFT LATERAL INTERBODY FUSION LUMBAR TWO-THREE LATERAL PLATE;  Surgeon: Charlie Pitter, MD;  Location: [facility] [facility];  Service: Neurosurgery;  Laterality: Left;  Marland Kitchen Kyphoplasty N/A 09/14/2014    Procedure: Lumbar Two Kyphoplasty/Vertebroplasty;  Surgeon: Charlie Pitter, MD;  Location: [place] [facility];  Service: Neurosurgery;  Laterality: N/A;  Lumbar Two Kyphoplasty/Vertebroplasty    Family History  Problem Relation Age of Onset  . Cancer Mother 70    Breast  . Heart disease Mother   . Hypertension Mother   . Hyperlipidemia Mother   . Heart attack Mother   . Asthma Maternal Grandmother     Social History   Social History  . Marital Status: Widowed    Spouse Name: N/A  . Number of Children: 2  . Years of Education: N/A   Occupational History  . Not on file.   Social History Main Topics  . Smoking status: Former Smoker -- 0.50 packs/day for 32 years    Types: Cigarettes    Quit date:  07/22/1994  . Smokeless tobacco: Never Used  . Alcohol Use: No  . Drug Use: No  . Sexual Activity: Not on file   Other Topics Concern  . Not on file   Social History Narrative   Married 76 years and has 2 children (2 daughters)   Alcohol Use - no   Former Smoker - she quit 10 years ago, started when she was 34 and has had varying level of use of tobacco products from 1/2 pack to 3 packs per day.    Outpatient Prescriptions Prior to Visit  Medication Sig Dispense Refill  . aspirin EC 81 MG tablet Take 81 mg by mouth daily.    Marland Kitchen atenolol (TENORMIN) 50 MG tablet TAKE 1 TABLET (50 MG TOTAL) BY MOUTH DAILY. 90 tablet 1  . BESIVANCE 0.6 % SUSP Reported on 07/23/2015  12  . Calcium Carbonate-Vit D-Min (CALCIUM 1200 PO) Take 1,200 mg by mouth daily.     .  Cholecalciferol (VITAMIN D) 2000 UNITS tablet Take 2,000 Units by mouth daily.    . Cinnamon 500 MG capsule Take 2,000 mg by mouth daily.      . citalopram (CELEXA) 10 MG tablet TAKE 1 TABLET (10 MG TOTAL) BY MOUTH DAILY. 90 tablet 1  . Coenzyme Q10 200 MG capsule Take 200 mg by mouth daily.    . CRESTOR 10 MG tablet TAKE 1 TABLET (10 MG TOTAL) BY MOUTH DAILY. TAKE 1 TABLET ON SATURDAY AND 1/2 TAB DAILY (Patient taking differently: TAKE 1 TABLET on Tuesday and Saturday and  1/2 TAB all other days.) 30 tablet 6  . esomeprazole (NEXIUM) 40 MG capsule Take 40 mg by mouth daily at 12 noon.    . fexofenadine-pseudoephedrine (ALLEGRA-D 24) 180-240 MG 24 hr tablet Take 1 tablet by mouth daily.    Marland Kitchen gabapentin (NEURONTIN) 300 MG capsule Take 1 capsule (300 mg total) by mouth 2 (two) times daily as needed. (Patient taking differently: Take 300 mg by mouth at bedtime. ) 1809 capsule 2  . Glucosamine HCl (GLUCOSAMINE PO) Take by mouth daily.    . hydrochlorothiazide (HYDRODIURIL) 25 MG tablet TAKE 1 TABLET (25 MG TOTAL) BY MOUTH DAILY. 90 tablet 1  . levothyroxine (SYNTHROID, LEVOTHROID) 112 MCG tablet TAKE 1 TABLET (112 MCG TOTAL) BY MOUTH DAILY BEFORE BREAKFAST. 90 tablet 1  . mupirocin ointment (BACTROBAN) 2 % Apply twice daily to bridge of the nose. 22 g 0  . Omega-3 Fatty Acids (FISH OIL) 1200 MG CAPS Take 2,400 mg by mouth daily.     . traMADol (ULTRAM) 50 MG tablet Take 2 tablets (100 mg total) by mouth every 6 (six) hours as needed for moderate pain or severe pain. 120 tablet 0  . vitamin B-12 (CYANOCOBALAMIN) 1000 MCG tablet Take 1,000 mcg by mouth daily.     No facility-administered medications prior to visit.    Allergies  Allergen Reactions  . Lipitor [Atorvastatin] Swelling  . Metronidazole Swelling  . Penicillins Swelling  . Pregabalin Other (See Comments)    REACTION: Somnolence and dizziness  . Simvastatin Other (See Comments)    Leg pain    Review of Systems  Constitutional:  Negative for fever and malaise/fatigue.  HENT: Negative for congestion.   Eyes: Negative for blurred vision.  Respiratory: Negative for shortness of breath.   Cardiovascular: Negative for chest pain, palpitations and leg swelling.  Gastrointestinal: Negative for nausea, abdominal pain and blood in stool.  Genitourinary: Negative for dysuria and frequency.  Musculoskeletal: Negative for  falls.  Skin: Negative for rash.  Neurological: Positive for dizziness and tremors. Negative for loss of consciousness and headaches.  Endo/Heme/Allergies: Negative for environmental allergies.  Psychiatric/Behavioral: Negative for depression. The patient is nervous/anxious.        Objective:    Physical Exam  Constitutional: She is oriented to person, place, and time. She appears well-developed and well-nourished. No distress.  HENT:  Head: Normocephalic and atraumatic.  Eyes: Conjunctivae are normal.  Neck: Neck supple. No thyromegaly present.  Cardiovascular: Normal rate, regular rhythm and normal heart sounds.   Pulmonary/Chest: Effort normal and breath sounds normal. No respiratory distress.  Abdominal: Soft. Bowel sounds are normal. She exhibits no distension and no mass. There is no tenderness.  Musculoskeletal: She exhibits no edema.  Lymphadenopathy:    She has no cervical adenopathy.  Neurological: She is alert and oriented to person, place, and time.  Skin: Skin is warm and dry.  Psychiatric: She has a normal mood and affect. Her behavior is normal.    BP 140/90 mmHg  Pulse 60  Temp(Src) 98.6 F (37 C) (Oral)  Ht _0  (1.626 m)  Wt 173 lb 4 oz (78.586 kg)  BMI 29.72 kg/m2  SpO2 93% Wt Readings from Last 3 Encounters:  10/26/15 173 lb 4 oz (78.586 kg)  10/11/15 173 lb 6.4 oz (78.654 kg)  07/23/15 174 lb (78.926 kg)     Lab Results  Component Value Date   WBC 10.0 06/15/2015   HGB 15.2* 06/15/2015   HCT 45.2 06/15/2015   PLT 209.0 06/15/2015   GLUCOSE 128* 06/15/2015    CHOL 176 06/15/2015   TRIG 171.0* 06/15/2015   HDL 43.10 06/15/2015   LDLDIRECT 123.0 03/09/2015   LDLCALC 99 06/15/2015   ALT 22 06/15/2015   AST 24 06/15/2015   NA 141 06/15/2015   K 3.8 06/15/2015   CL 102 06/15/2015   CREATININE 0.87 06/15/2015   BUN 17 06/15/2015   CO2 30 06/15/2015   TSH 0.39 06/15/2015   INR 1.06 06/23/2013   HGBA1C 5.7 06/15/2015   MICROALBUR 0.2 04/27/2014    Lab Results  Component Value Date   TSH 0.39 06/15/2015   Lab Results  Component Value Date   WBC 10.0 06/15/2015   HGB 15.2* 06/15/2015   HCT 45.2 06/15/2015   MCV 89.2 06/15/2015   PLT 209.0 06/15/2015   Lab Results  Component Value Date   NA 141 06/15/2015   K 3.8 06/15/2015   CO2 30 06/15/2015   GLUCOSE 128* 06/15/2015   BUN 17 06/15/2015   CREATININE 0.87 06/15/2015   BILITOT 0.7 06/15/2015   ALKPHOS 57 06/15/2015   AST 24 06/15/2015   ALT 22 06/15/2015   PROT 7.4 06/15/2015   ALBUMIN 4.2 06/15/2015   CALCIUM 10.0 06/15/2015   ANIONGAP 9 09/14/2014   GFR 68.18 06/15/2015   Lab Results  Component Value Date   CHOL 176 06/15/2015   Lab Results  Component Value Date   HDL 43.10 06/15/2015   Lab Results  Component Value Date   LDLCALC 99 06/15/2015   Lab Results  Component Value Date   TRIG 171.0* 06/15/2015   Lab Results  Component Value Date   CHOLHDL 4 06/15/2015   Lab Results  Component Value Date   HGBA1C 5.7 06/15/2015       Assessment & Plan:   Problem List Items Addressed This Visit      Cardiovascular and Mediastinum   Essential hypertension (Chronic)    Well  controlled, no changes to meds. Encouraged heart healthy diet such as the DASH diet and exercise as tolerated.         Digestive   GERD (Chronic)     Other   Hyperlipidemia (Chronic)   Obesity (Chronic)   Chronic pain syndrome - Primary    Other Visit Diagnoses    Arthritis           I am having Ms. Casad maintain her Cinnamon, Fish Oil, Calcium Carbonate-Vit D-Min (CALCIUM  1200 PO), Vitamin D, Coenzyme Q10, gabapentin, esomeprazole, aspirin EC, traMADol, BESIVANCE, mupirocin ointment, Glucosamine HCl (GLUCOSAMINE PO), CRESTOR, hydrochlorothiazide, levothyroxine, atenolol, fexofenadine-pseudoephedrine, vitamin B-12, and citalopram.  No orders of the defined types were placed in this encounter.     Jan Fireman, [place]

## 2015-10-26 NOTE — Assessment & Plan Note (Signed)
Well controlled, no changes to meds. Encouraged heart healthy diet such as the DASH diet and exercise as tolerated.

## 2015-10-26 NOTE — Patient Instructions (Signed)

## 2015-10-28 ENCOUNTER — Encounter: Payer: Self-pay | Admitting: Family Medicine

## 2015-10-28 DIAGNOSIS — E039 Hypothyroidism, unspecified: Secondary | ICD-10-CM

## 2015-10-28 HISTORY — DX: Hypothyroidism, unspecified: E03.9

## 2015-10-28 NOTE — Assessment & Plan Note (Signed)
Encouraged DASH diet, decrease po intake and increase exercise as tolerated. Needs 7-8 hours of sleep nightly. Avoid trans fats, eat small, frequent meals every 4-5 hours with lean proteins, complex carbs and healthy fats. Minimize simple carbs

## 2015-10-28 NOTE — Assessment & Plan Note (Signed)
Also complaining of some recent increase in unsteady gait but no falls, will send for physical therapy to see if she can regain her strength and sense of balance.

## 2015-10-28 NOTE — Assessment & Plan Note (Signed)
Long standing stable

## 2015-10-28 NOTE — Assessment & Plan Note (Signed)
Tolerating statin, encouraged heart healthy diet, avoid trans fats, minimize simple carbs and saturated fats. Increase exercise as tolerated 

## 2015-10-28 NOTE — Assessment & Plan Note (Signed)
Avoid offending foods. Do not eat large meals in late evening and consider raising head of bed.

## 2015-10-28 NOTE — Assessment & Plan Note (Signed)
On Levothyroxine, continue to monitor 

## 2015-10-30 ENCOUNTER — Other Ambulatory Visit: Payer: Self-pay | Admitting: Family Medicine

## 2015-10-30 ENCOUNTER — Ambulatory Visit (HOSPITAL_BASED_OUTPATIENT_CLINIC_OR_DEPARTMENT_OTHER)
Admission: RE | Admit: 2015-10-30 | Discharge: 2015-10-30 | Disposition: A | Discharge status: Home / Self Care | Payer: Medicare Other | Source: Ambulatory Visit | Attending: Family Medicine | Admitting: Family Medicine

## 2015-10-30 DIAGNOSIS — G894 Chronic pain syndrome: Secondary | ICD-10-CM | POA: Insufficient documentation

## 2015-10-30 DIAGNOSIS — M1990 Unspecified osteoarthritis, unspecified site: Secondary | ICD-10-CM | POA: Diagnosis not present

## 2015-10-30 DIAGNOSIS — E785 Hyperlipidemia, unspecified: Secondary | ICD-10-CM | POA: Diagnosis not present

## 2015-10-30 DIAGNOSIS — R519 Headache, unspecified: Secondary | ICD-10-CM

## 2015-10-30 DIAGNOSIS — I10 Essential (primary) hypertension: Secondary | ICD-10-CM | POA: Diagnosis not present

## 2015-10-30 DIAGNOSIS — K219 Gastro-esophageal reflux disease without esophagitis: Secondary | ICD-10-CM | POA: Diagnosis not present

## 2015-10-30 DIAGNOSIS — R2689 Other abnormalities of gait and mobility: Secondary | ICD-10-CM | POA: Diagnosis not present

## 2015-10-30 DIAGNOSIS — R51 Headache: Secondary | ICD-10-CM | POA: Insufficient documentation

## 2015-10-30 DIAGNOSIS — E669 Obesity, unspecified: Secondary | ICD-10-CM

## 2015-10-31 NOTE — Progress Notes (Signed)
RN AWV note reviewed. Agree with documention and plan.

## 2015-11-01 DIAGNOSIS — H353131 Nonexudative age-related macular degeneration, bilateral, early dry stage: Secondary | ICD-10-CM | POA: Diagnosis not present

## 2015-11-01 DIAGNOSIS — H348322 Tributary (branch) retinal vein occlusion, left eye, stable: Secondary | ICD-10-CM | POA: Diagnosis not present

## 2015-11-01 DIAGNOSIS — Z961 Presence of intraocular lens: Secondary | ICD-10-CM | POA: Diagnosis not present

## 2015-11-02 ENCOUNTER — Encounter (HOSPITAL_BASED_OUTPATIENT_CLINIC_OR_DEPARTMENT_OTHER): Payer: Self-pay

## 2015-11-02 ENCOUNTER — Ambulatory Visit (HOSPITAL_BASED_OUTPATIENT_CLINIC_OR_DEPARTMENT_OTHER)
Admission: RE | Admit: 2015-11-02 | Discharge: 2015-11-02 | Disposition: A | Discharge status: Home / Self Care | Payer: Medicare Other | Source: Ambulatory Visit | Attending: Family Medicine | Admitting: Family Medicine

## 2015-11-02 DIAGNOSIS — R51 Headache: Secondary | ICD-10-CM | POA: Insufficient documentation

## 2015-11-02 DIAGNOSIS — I739 Peripheral vascular disease, unspecified: Secondary | ICD-10-CM | POA: Insufficient documentation

## 2015-11-02 DIAGNOSIS — I779 Disorder of arteries and arterioles, unspecified: Secondary | ICD-10-CM | POA: Diagnosis not present

## 2015-11-02 DIAGNOSIS — R519 Headache, unspecified: Secondary | ICD-10-CM

## 2015-11-02 MED ORDER — IOPAMIDOL (ISOVUE-370) INJECTION 76%
100.0000 mL | Freq: Once | INTRAVENOUS | Status: AC | PRN
  Administered 2015-11-02: 100 mL via INTRAVENOUS

## 2015-11-04 ENCOUNTER — Other Ambulatory Visit: Payer: Self-pay | Admitting: Family Medicine

## 2015-11-04 DIAGNOSIS — I708 Atherosclerosis of other arteries: Secondary | ICD-10-CM

## 2015-11-04 DIAGNOSIS — I7090 Unspecified atherosclerosis: Secondary | ICD-10-CM

## 2015-11-04 DIAGNOSIS — I729 Aneurysm of unspecified site: Secondary | ICD-10-CM

## 2015-11-05 ENCOUNTER — Ambulatory Visit: Payer: Medicare Other | Admitting: Physical Therapy

## 2015-11-12 ENCOUNTER — Other Ambulatory Visit: Payer: Self-pay | Admitting: Family Medicine

## 2015-11-22 DIAGNOSIS — M4806 Spinal stenosis, lumbar region: Secondary | ICD-10-CM | POA: Diagnosis not present

## 2015-12-11 ENCOUNTER — Encounter (INDEPENDENT_AMBULATORY_CARE_PROVIDER_SITE_OTHER): Payer: Medicare Other | Admitting: Ophthalmology

## 2015-12-11 DIAGNOSIS — H43813 Vitreous degeneration, bilateral: Secondary | ICD-10-CM

## 2015-12-11 DIAGNOSIS — I10 Essential (primary) hypertension: Secondary | ICD-10-CM

## 2015-12-11 DIAGNOSIS — H34832 Tributary (branch) retinal vein occlusion, left eye, with macular edema: Secondary | ICD-10-CM

## 2015-12-11 DIAGNOSIS — H35033 Hypertensive retinopathy, bilateral: Secondary | ICD-10-CM | POA: Diagnosis not present

## 2015-12-12 ENCOUNTER — Encounter: Payer: Self-pay | Admitting: Physical Therapy

## 2015-12-12 ENCOUNTER — Ambulatory Visit: Payer: Medicare Other | Attending: Family Medicine | Admitting: Physical Therapy

## 2015-12-12 DIAGNOSIS — R2689 Other abnormalities of gait and mobility: Secondary | ICD-10-CM

## 2015-12-12 DIAGNOSIS — R262 Difficulty in walking, not elsewhere classified: Secondary | ICD-10-CM | POA: Insufficient documentation

## 2015-12-12 NOTE — Therapy (Signed)
[facility] [address]  [place], [place], [address] Phone: [phone]   Fax:  [phone]  Physical Therapy Evaluation  Patient Details  Name: Kimberly Irwin MRN: [id number] Date of Birth: 1945-09-23 Referring Provider: Willette Alma MD  Encounter Date: 12/12/2015      PT End of Session - 12/12/15 0935    Visit Number 1   Number of Visits 8   Date for PT Re-Evaluation 01/09/16   PT Start Time 0928   PT Stop Time 1018   PT Time Calculation (min) 50 min      Past Medical History  Diagnosis Date  . Sarcoidosis ([place])   . Hyperlipidemia   . GERD (gastroesophageal reflux disease)   . Hypertension   . Chronic low back pain   . DDD (degenerative disc disease)     L3-4 with facet arthropathy and stenosis  . Degenerative spondylolisthesis     L4-5 grade 1 with stenosis  . Acute hip pain 10/26/2012  . Bursitis of left hip 10/26/2012  . Pain in joint, lower leg 10/26/2012    left   . Tremors of nervous system   . Hypothyroidism   . Heart murmur     as an infant  . Pneumonia     as a child several times.   . History of hiatal hernia   . Hypothyroidism 10/28/2015    Past Surgical History  Procedure Laterality Date  . Carpal tunnel release Bilateral   . Thyroidectomy    . Back surgery  01/15/09    L3-4 and L4-5 decompressive laminectomy with bilateral L3, L4, and L5 decompressive foraminotomies, more than it would be required for simple interbody fusion alone.  . Back surgery  01/15/09    L3-4 and L4-5 posterior lumbar interbody fusion utilizing tanget interbody allograft wedge, Telamon interbody PEEk cage, and local autografting.  . Back surgery  01/15/09    L3, L4, and L5 posterolateral arthrodesis using segmental pedicle screw fixation and local autografting.  . Joint replacement  09/13/09    Right Hip, Dr. Alvan Dame  . Total hip arthroplasty Left 06/28/2013    Procedure: LEFT TOTAL  HIP ARTHROPLASTY ANTERIOR  APPROACH;  Surgeon: Mauri Pole, MD;  Location: [facility];  Service: Orthopedics;  Laterality: Left;  Marland Kitchen Eye surgery      lens implant  . Colonoscopy    . Esophagogastroduodenoscopy    . Cardiac catheterization    . Anterior lat lumbar fusion Left 08/29/2014    Procedure: EXTREME LEFT LATERAL INTERBODY FUSION LUMBAR TWO-THREE LATERAL PLATE;  Surgeon: Charlie Pitter, MD;  Location: [place] [facility];  Service: Neurosurgery;  Laterality: Left;  Marland Kitchen Kyphoplasty N/A 09/14/2014    Procedure: Lumbar Two Kyphoplasty/Vertebroplasty;  Surgeon: Charlie Pitter, MD;  Location: [place] [facility];  Service: Neurosurgery;  Laterality: N/A;  Lumbar Two Kyphoplasty/Vertebroplasty    There were no vitals filed for this visit.       Subjective Assessment - 12/12/15 0935    Subjective pt sent to OPPT due to worsening balance and gait.  She states she has a tendency to drift to one side or the other while walking and states she often stumbles but denies falling.  She states her balance/gait seems to have worsened over the past couple months.  Pt with extensive PMHx that can affect gait to include B THA and lumbar fusions.   Pertinent History Tremors, lumbar surgeries (L3-4 and 4-5 fusion 2010, L2-3 in 2016),  R THA 2011, L THA 2014 (states L hip has bothered her since the surgery due to pain and popping with walking), B knee OA   Currently in Pain? Yes   Pain Score --  rates pain 7/10 when present   Pain Location Hip   Pain Orientation Left   Pain Onset More than a month ago   Pain Frequency Occasional  only noted with prolonged walking currently   Aggravating Factors  prolonged walking (shopping)   Pain Relieving Factors medication            [facility] PT Assessment - 12/12/15 0001    Assessment   Medical Diagnosis poor balance   Referring Provider Willette Alma MD   Onset Date/Surgical Date 10/03/15   Next MD Visit July 2017   Balance Screen   Has the patient fallen in the past 6 months No   Has the patient had a  decrease in activity level because of a fear of falling?  No   Is the patient reluctant to leave their home because of a fear of falling?  No   Home Environment   Living Environment Private residence   Living Arrangements Children   Type of [place] One level   Prior Function   Vocation Retired   Leisure walks her dogs, performs housework and Haematologist (uses gripper to pick up items due to hip and back issues)   Observation/Other Assessments   Focus on Therapeutic Outcomes (FOTO)  23% limitation   ROM / Strength   AROM / PROM / Strength Strength   Strength   Strength Assessment Site Hip   Right/Left Hip Right;Left   Right Hip Flexion 4+/5   Right Hip Extension 4/5   Right Hip External Rotation  3+/5   Right Hip Internal Rotation 5/5   Right Hip ABduction 4/5   Right Hip ADduction 5/5   Left Hip Flexion 4+/5   Left Hip Extension 4/5   Left Hip External Rotation 4-/5   Left Hip Internal Rotation 4+/5   Left Hip ABduction 4-/5   Left Hip ADduction 4+/5   Standardized Balance Assessment   Standardized Balance Assessment Berg Balance Test;Dynamic Gait Index   Berg Balance Test   Sit to Stand Able to stand without using hands and stabilize independently   Standing Unsupported Able to stand safely 2 minutes   Sitting with Back Unsupported but Feet Supported on Floor or Stool Able to sit safely and securely 2 minutes   Stand to Sit Sits safely with minimal use of hands   Transfers Able to transfer safely, minor use of hands   Standing Unsupported with Eyes Closed Able to stand 10 seconds with supervision   Standing Ubsupported with Feet Together Able to place feet together independently and stand 1 minute safely   From Standing, Reach Forward with Outstretched Arm Can reach confidently >25 cm (10")   From Standing Position, Pick up Object from Floor --  unable to attempt due to recent l-spine fusion   From Standing Position, Turn to Look Behind Over each Shoulder --   unable due to recent l-spine fusion   Turn 360 Degrees Able to turn 360 degrees safely one side only in 4 seconds or less   Standing Unsupported, Alternately Place Feet on Step/Stool Able to stand independently and safely and complete 8 steps in 20 seconds   Standing Unsupported, One Foot in Front Able to plae foot ahead of the other independently and hold 30  seconds   Standing on One Leg Tries to lift leg/unable to hold 3 seconds but remains standing independently   Dynamic Gait Index   Level Surface Mild Impairment  deviates slightly to R   Change in Gait Speed Mild Impairment   Gait with Horizontal Head Turns Moderate Impairment   Gait with Vertical Head Turns Mild Impairment   Gait and Pivot Turn Normal   Step Over Obstacle Normal   Step Around Obstacles Normal   Steps Mild Impairment   Total Score 18                  PT Long Term Goals - 2015-12-27 1408    PT LONG TERM GOAL #1   Title pt scores 20/24 or better on DGI by 01/09/16   Status New   PT LONG TERM GOAL #2   Title B Hip MMT 4/5 or better all planes by 01/09/16   Status New   PT LONG TERM GOAL #3   Title pt independent with HEP for continued progress as necessary by 01/09/16   Status New               Plan - 12-27-15 1358    Clinical Impression Statement pt is 70 y/o female with c/o worsening gait and balance over the past 2 months.  She states she feels as though she has difficulty walking in a straight line and stumbles while walking (but denies falling).  Today's assessment reveals mild impairment in dynamic balance but significant weakness also noted in B hips. Pt with PMHx for R THA in 2010 and L THA in 2014. She states she has noted L hip pain along with a clicking sensation in L hip since the surgery. Her gait is safe but she does display difficulty with maintaining consistent course (tended to track to the right today but she states this varies).  In addition, she displays minimal foot clearance and  note intermittent toe drag during swing phase which is likely what causes her "stumbles". SLS and tandem stance are poor but other static balance activities normal for the most part.   Rehab Potential Good   Clinical Impairments Affecting Rehab Potential age, history of 3 lumbar sugeries (2 fusions), history of B THA and c/o L Hip pain since L THA in 2014   PT Frequency 2x / week   PT Duration 4 weeks   PT Treatment/Interventions Therapeutic exercise;Balance training;Gait training   PT Next Visit Plan B Hip stability training; balance/proprioception training; HEP instruction   Consulted and Agree with Plan of Care Patient      Patient will benefit from skilled therapeutic intervention in order to improve the following deficits and impairments:  Abnormal gait, Difficulty walking, Decreased balance, Decreased strength, Pain  Visit Diagnosis: Other abnormalities of gait and mobility  Difficulty in walking, not elsewhere classified      G-Codes - 2015/12/27 1510    Functional Assessment Tool Used FOTO 23% impairment   Functional Limitation Mobility: Walking and moving around   Mobility: Walking and Moving Around Current Status [phone]) At least 20 percent but less than 40 percent impaired, limited or restricted   Mobility: Walking and Moving Around Goal Status [phone]) At least 1 percent but less than 20 percent impaired, limited or restricted       Problem List Patient Active Problem List   Diagnosis Date Noted  . Hypothyroidism 10/28/2015  . Skin infection 12/28/2014  . Lumbar compression fracture ([place]) 09/14/2014  . Medicare annual  wellness visit, subsequent 04/27/2014  . Obesity   . S/P left THA, AA 06/28/2013  . Decreased visual acuity 02/19/2013  . Chronic pain syndrome 01/20/2011  . Depression 08/10/2008  . GERD 04/26/2008  . Lumbar disc disease 05/31/2007  . 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 Hyperlipidemia 05/29/2007  . Tremor   . Essential hypertension     Menachem Urbanek  PT, OCS 12/12/2015, 3:11 PM  [facility] [address]  [place], [place], [address] Phone: [phone]   Fax:  [phone]  Name: Kimberly Irwin MRN: [id number] Date of Birth: 02-20-1946

## 2015-12-14 ENCOUNTER — Ambulatory Visit: Payer: Medicare Other

## 2015-12-14 DIAGNOSIS — R2689 Other abnormalities of gait and mobility: Secondary | ICD-10-CM | POA: Diagnosis not present

## 2015-12-14 DIAGNOSIS — R262 Difficulty in walking, not elsewhere classified: Secondary | ICD-10-CM | POA: Diagnosis not present

## 2015-12-14 NOTE — Therapy (Signed)
[facility] [address]  [place], [place], [address] Phone: [phone]   Fax:  [phone]  Physical Therapy Treatment  Patient Details  Name: Kimberly Irwin MRN: [id number] Date of Birth: 1945-10-27 Referring Provider: Willette Alma MD  Encounter Date: 12/14/2015      PT End of Session - 12/14/15 0942    Visit Number 2   Number of Visits 8   Date for PT Re-Evaluation 01/09/16   PT Start Time 0937   PT Stop Time 1020   PT Time Calculation (min) 43 min      Past Medical History  Diagnosis Date  . Sarcoidosis ([place])   . Hyperlipidemia   . GERD (gastroesophageal reflux disease)   . Hypertension   . Chronic low back pain   . DDD (degenerative disc disease)     L3-4 with facet arthropathy and stenosis  . Degenerative spondylolisthesis     L4-5 grade 1 with stenosis  . Acute hip pain 10/26/2012  . Bursitis of left hip 10/26/2012  . Pain in joint, lower leg 10/26/2012    left   . Tremors of nervous system   . Hypothyroidism   . Heart murmur     as an infant  . Pneumonia     as a child several times.   . History of hiatal hernia   . Hypothyroidism 10/28/2015    Past Surgical History  Procedure Laterality Date  . Carpal tunnel release Bilateral   . Thyroidectomy    . Back surgery  01/15/09    L3-4 and L4-5 decompressive laminectomy with bilateral L3, L4, and L5 decompressive foraminotomies, more than it would be required for simple interbody fusion alone.  . Back surgery  01/15/09    L3-4 and L4-5 posterior lumbar interbody fusion utilizing tanget interbody allograft wedge, Telamon interbody PEEk cage, and local autografting.  . Back surgery  01/15/09    L3, L4, and L5 posterolateral arthrodesis using segmental pedicle screw fixation and local autografting.  . Joint replacement  09/13/09    Right Hip, Dr. Alvan Dame  . Total hip arthroplasty Left 06/28/2013    Procedure: LEFT TOTAL  HIP ARTHROPLASTY ANTERIOR  APPROACH;  Surgeon: Mauri Pole, MD;  [facility]: [facility];  Service: Orthopedics;  Laterality: Left;  Marland Kitchen Eye surgery      lens implant  . Colonoscopy    . Esophagogastroduodenoscopy    . Cardiac catheterization    . Anterior lat lumbar fusion Left 08/29/2014    Procedure: EXTREME LEFT LATERAL INTERBODY FUSION LUMBAR TWO-THREE LATERAL PLATE;  Surgeon: Charlie Pitter, MD;  Location: [place];  Service: Neurosurgery;  Laterality: Left;  Marland Kitchen Kyphoplasty N/A 09/14/2014    Procedure: Lumbar Two Kyphoplasty/Vertebroplasty;  Surgeon: Charlie Pitter, MD;  Location: [place] [facility];  Service: Neurosurgery;  Laterality: N/A;  Lumbar Two Kyphoplasty/Vertebroplasty    There were no vitals filed for this visit.      Subjective Assessment - 12/14/15 0940    Subjective Pt. reports her left anterior hip has pain today, which has worsened due to the bad weather to a 6/10 currently.    Currently in Pain? Yes   Pain Score 6    Pain Location Hip   Pain Orientation Left   Pain Descriptors / Indicators Aching;Dull   Pain Onset More than a month ago   Pain Frequency Occasional      Today's Treatment: Therex: NuStep: level 3, 4 min  L HS,  glute, SKTC stretch x 30 sec   HEP Instruction: Bridge x 10 reps  Seated B hip ER with green band around knees x 10 reps Seated B hip abd/ER with green band around knees x 10 reps Seated adduction squeeze with pillow 3" x 10 reps Seated marching with green TB around knees x 10 reps each leg Standing hip abduction, adduction, extensions, flexion x 10 reps; holding onto chair Supine SLR hip flexion x 10 reps each leg Supine SLR hip abduction x 10 reps each leg        PT Education - 12/14/15 1026    Education provided Yes   Education Details hip strengthening HEP    Person(s) Educated Patient   Methods Explanation;Verbal cues;Handout   Comprehension Verbalized understanding;Returned demonstration;Need further instruction             PT Long Term Goals -  12/14/15 0957    PT LONG TERM GOAL #1   Title pt scores 20/24 or better on DGI by 01/09/16   Status On-going   PT LONG TERM GOAL #2   Title B Hip MMT 4/5 or better all planes by 01/09/16   Status On-going   PT LONG TERM GOAL #3   Title pt independent with HEP for continued progress as necessary by 01/09/16   Status On-going               Plan - 12/14/15 0957    Clinical Impression Statement Pt. reports her left anterior hip has pain today, which has worsened due to the bad weather to a 6/10 currently.  Basic supine / standing hip strengthening HEP issued to pt. today with handout with pt. verbalizing / demonstrating all activities correctly.  Plan to review these activities next visit.     PT Treatment/Interventions Therapeutic exercise;Balance training;Gait training   PT Next Visit Plan HEP review; B Hip stability training; balance/proprioception training      Patient will benefit from skilled therapeutic intervention in order to improve the following deficits and impairments:  Abnormal gait, Difficulty walking, Decreased balance, Decreased strength, Pain  Visit Diagnosis: Other abnormalities of gait and mobility  Difficulty in walking, not elsewhere classified     Problem List Patient Active Problem List   Diagnosis Date Noted  . Hypothyroidism 10/28/2015  . Skin infection 12/28/2014  . Lumbar compression fracture ([place]) 09/14/2014  . Medicare annual wellness visit, subsequent 04/27/2014  . Obesity   . S/P left THA, AA 06/28/2013  . Decreased visual acuity 02/19/2013  . Chronic pain syndrome 01/20/2011  . Depression 08/10/2008  . GERD 04/26/2008  . Lumbar disc disease 05/31/2007  . 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 Hyperlipidemia 05/29/2007  . Tremor   . Essential hypertension     Bess Harvest, PTA 12/14/2015, 12:18 PM  [facility] [address]  [place], [place], [address] Phone: [phone]   Fax:   [phone]  Name: Kimberly Irwin MRN: [id number] Date of Birth: Feb 27, 1946

## 2015-12-18 ENCOUNTER — Ambulatory Visit: Payer: Medicare Other | Admitting: Physical Therapy

## 2015-12-18 DIAGNOSIS — R262 Difficulty in walking, not elsewhere classified: Secondary | ICD-10-CM

## 2015-12-18 DIAGNOSIS — R2689 Other abnormalities of gait and mobility: Secondary | ICD-10-CM | POA: Diagnosis not present

## 2015-12-18 NOTE — Therapy (Signed)
[facility] [address]  [place], [place], [address] Phone: [phone]   Fax:  [phone]  Physical Therapy Treatment  Patient Details  Name: Kimberly Irwin MRN: [id number] Date of Birth: 05/31/1946 Referring Provider: Willette Alma MD  Encounter Date: 12/18/2015      PT End of Session - 12/18/15 0924    Visit Number 3   Number of Visits 8   Date for PT Re-Evaluation 01/09/16   PT Start Time 0923   PT Stop Time 1008   PT Time Calculation (min) 45 min      Past Medical History  Diagnosis Date  . Sarcoidosis ([place])   . Hyperlipidemia   . GERD (gastroesophageal reflux disease)   . Hypertension   . Chronic low back pain   . DDD (degenerative disc disease)     L3-4 with facet arthropathy and stenosis  . Degenerative spondylolisthesis     L4-5 grade 1 with stenosis  . Acute hip pain 10/26/2012  . Bursitis of left hip 10/26/2012  . Pain in joint, lower leg 10/26/2012    left   . Tremors of nervous system   . Hypothyroidism   . Heart murmur     as an infant  . Pneumonia     as a child several times.   . History of hiatal hernia   . Hypothyroidism 10/28/2015    Past Surgical History  Procedure Laterality Date  . Carpal tunnel release Bilateral   . Thyroidectomy    . Back surgery  01/15/09    L3-4 and L4-5 decompressive laminectomy with bilateral L3, L4, and L5 decompressive foraminotomies, more than it would be required for simple interbody fusion alone.  . Back surgery  01/15/09    L3-4 and L4-5 posterior lumbar interbody fusion utilizing tanget interbody allograft wedge, Telamon interbody PEEk cage, and local autografting.  . Back surgery  01/15/09    L3, L4, and L5 posterolateral arthrodesis using segmental pedicle screw fixation and local autografting.  . Joint replacement  09/13/09    Right Hip, Dr. Alvan Dame  . Total hip arthroplasty Left 06/28/2013    Procedure: LEFT TOTAL  HIP ARTHROPLASTY ANTERIOR  APPROACH;  Surgeon: Mauri Pole, MD;  [facility]: [facility];  Service: Orthopedics;  Laterality: Left;  Marland Kitchen Eye surgery      lens implant  . Colonoscopy    . Esophagogastroduodenoscopy    . Cardiac catheterization    . Anterior lat lumbar fusion Left 08/29/2014    Procedure: EXTREME LEFT LATERAL INTERBODY FUSION LUMBAR TWO-THREE LATERAL PLATE;  Surgeon: Charlie Pitter, MD;  Location: [place];  Service: Neurosurgery;  Laterality: Left;  Marland Kitchen Kyphoplasty N/A 09/14/2014    Procedure: Lumbar Two Kyphoplasty/Vertebroplasty;  Surgeon: Charlie Pitter, MD;  Location: [facility] [place];  Service: Neurosurgery;  Laterality: N/A;  Lumbar Two Kyphoplasty/Vertebroplasty    There were no vitals filed for this visit.      Subjective Assessment - 12/18/15 0925    Subjective States has been performing HEP daily but has noted some mild LBP with standing hip extension exercises.   Currently in Pain? Yes   Pain Score 4    Pain Location Back   Pain Orientation Lower          TODAY'S TREATMENT TherEx - NuStep lvl 3, 4' Bridge 10x Bridge with Diona Foley between knees 10x2" Bridge with Black TB at knees and unilateral Hip ABD 2x8 each Side-Lying Hip ABD 10x each (  VC for not flexing hip) Foam Beam Side-Stepping with Yellow TB at ankles 3 laps with CGA Foam Pad Staggered standing with single pole A 30" each BOSU (Up) FW lunge with single pole A 10x each TRX DL Squat 10x TRX Lateral Lunges 6x each Fitter Hip ABD 2 Blue 12x each Mini Tramp March 12x each 8" step toe-tapping while standing on Foam Pad 12x each 8" ALT side step-up 10x each                          PT Long Term Goals - 12/14/15 0957    PT LONG TERM GOAL #1   Title pt scores 20/24 or better on DGI by 01/09/16   Status On-going   PT LONG TERM GOAL #2   Title B Hip MMT 4/5 or better all planes by 01/09/16   Status On-going   PT LONG TERM GOAL #3   Title pt independent with HEP for continued progress as necessary by 01/09/16    Status On-going               Plan - 12/18/15 1012    Clinical Impression Statement Pt has been noting some LBP with standing hip extension portion of HEP so advised her to not perform that particular exercise.  Today's session went very well with focus on both balance and hip strengthening/stability.  Difficulty with foam balance exercises and required SBA to CGA with these today.   PT Next Visit Plan B Hip stability training; balance/proprioception training   Consulted and Agree with Plan of Care Patient      Patient will benefit from skilled therapeutic intervention in order to improve the following deficits and impairments:  Abnormal gait, Difficulty walking, Decreased balance, Decreased strength, Pain  Visit Diagnosis: Other abnormalities of gait and mobility  Difficulty in walking, not elsewhere classified     Problem List Patient Active Problem List   Diagnosis Date Noted  . Hypothyroidism 10/28/2015  . Skin infection 12/28/2014  . Lumbar compression fracture ([place]) 09/14/2014  . Medicare annual wellness visit, subsequent 04/27/2014  . Obesity   . S/P left THA, AA 06/28/2013  . Decreased visual acuity 02/19/2013  . Chronic pain syndrome 01/20/2011  . Depression 08/10/2008  . GERD 04/26/2008  . Lumbar disc disease 05/31/2007  . 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 Hyperlipidemia 05/29/2007  . Tremor   . Essential hypertension     Oron Westrup PT, OCS 12/18/2015, 10:16 AM  [facility] [address]  [place], [place], [address] Phone: [phone]   Fax:  [phone]  Name: Kimberly Irwin MRN: [id number] Date of Birth: [birth year]

## 2015-12-21 ENCOUNTER — Ambulatory Visit: Payer: Medicare Other

## 2015-12-21 DIAGNOSIS — R262 Difficulty in walking, not elsewhere classified: Secondary | ICD-10-CM | POA: Diagnosis not present

## 2015-12-21 DIAGNOSIS — R2689 Other abnormalities of gait and mobility: Secondary | ICD-10-CM | POA: Diagnosis not present

## 2015-12-21 NOTE — Therapy (Signed)
[facility] [address]  [place], [place], [address] Phone: [phone]   Fax:  [phone]  Physical Therapy Treatment  Patient Details  Name: Kimberly Irwin MRN: [id number] Date of Birth: 1945-11-19 Referring Provider: Willette Alma MD  Encounter Date: 12/21/2015      PT End of Session - 12/21/15 0944    Visit Number 4   Number of Visits 8   Date for PT Re-Evaluation 01/09/16   PT Start Time 0932   PT Stop Time 1015   PT Time Calculation (min) 43 min   Activity Tolerance Patient tolerated treatment well   Behavior During Therapy [facility] for tasks assessed/performed      Past Medical History  Diagnosis Date  . Sarcoidosis ([place])   . Hyperlipidemia   . GERD (gastroesophageal reflux disease)   . Hypertension   . Chronic low back pain   . DDD (degenerative disc disease)     L3-4 with facet arthropathy and stenosis  . Degenerative spondylolisthesis     L4-5 grade 1 with stenosis  . Acute hip pain 10/26/2012  . Bursitis of left hip 10/26/2012  . Pain in joint, lower leg 10/26/2012    left   . Tremors of nervous system   . Hypothyroidism   . Heart murmur     as an infant  . Pneumonia     as a child several times.   . History of hiatal hernia   . Hypothyroidism 10/28/2015    Past Surgical History  Procedure Laterality Date  . Carpal tunnel release Bilateral   . Thyroidectomy    . Back surgery  01/15/09    L3-4 and L4-5 decompressive laminectomy with bilateral L3, L4, and L5 decompressive foraminotomies, more than it would be required for simple interbody fusion alone.  . Back surgery  01/15/09    L3-4 and L4-5 posterior lumbar interbody fusion utilizing tanget interbody allograft wedge, Telamon interbody PEEk cage, and local autografting.  . Back surgery  01/15/09    L3, L4, and L5 posterolateral arthrodesis using segmental pedicle screw fixation and local autografting.  . Joint replacement  09/13/09    Right  Hip, Dr. Alvan Dame  . Total hip arthroplasty Left 06/28/2013    Procedure: LEFT TOTAL  HIP ARTHROPLASTY ANTERIOR APPROACH;  Surgeon: Mauri Pole, MD;  [facility]: [facility];  Service: Orthopedics;  Laterality: Left;  Marland Kitchen Eye surgery      lens implant  . Colonoscopy    . Esophagogastroduodenoscopy    . Cardiac catheterization    . Anterior lat lumbar fusion Left 08/29/2014    Procedure: EXTREME LEFT LATERAL INTERBODY FUSION LUMBAR TWO-THREE LATERAL PLATE;  Surgeon: Charlie Pitter, MD;  Location: [place] [facility];  Service: Neurosurgery;  Laterality: Left;  Marland Kitchen Kyphoplasty N/A 09/14/2014    Procedure: Lumbar Two Kyphoplasty/Vertebroplasty;  Surgeon: Charlie Pitter, MD;  Location: [place] [facility];  Service: Neurosurgery;  Laterality: N/A;  Lumbar Two Kyphoplasty/Vertebroplasty    There were no vitals filed for this visit.      Subjective Assessment - 12/21/15 0942    Subjective Pt. reports L LB pain is a 7/10 today.     Currently in Pain? Yes   Pain Score 7    Pain Location Back   Pain Orientation Lower;Left   Pain Descriptors / Indicators Aching;Dull   Pain Type Acute pain   Pain Onset More than a month ago   Pain Frequency Occasional   Multiple Pain  Sites No      TODAY'S TREATMENT:  TherEx: NuStep lvl 5, 4' Bridge x 10 reps  Bridge with [employer] between [employer]" Bridge with Blue TB at knees and unilateral Hip ABD 2x8 each B Side-Lying Hip ABD with 1# cuffweight x 10 reps Foam Pad Staggered standing with single pole A 30" each BOSU (Up) FW lunge with single pole assist x 10 each  TRX DL Squat 10x TRX Lateral Lunges 6x each 8" step toe-tapping while standing on Foam Pad x 10 reps each  8" ALT side step-up 10x each          PT Long Term Goals - 12/14/15 0957    PT LONG TERM GOAL #1   Title pt scores 20/24 or better on DGI by 01/09/16   Status On-going   PT LONG TERM GOAL #2   Title B Hip MMT 4/5 or better all planes by 01/09/16   Status On-going   PT LONG TERM GOAL #3   Title pt  independent with HEP for continued progress as necessary by 01/09/16   Status On-going               Plan - 12/21/15 1013    Clinical Impression Statement Pt. reports being sore following last treatments more advanced strengthening activity at L-sided LB at a 7/10 pain initially.  Pt. tolerated all lumbopelvic strengthening activity very well today with a 0/10 LBP following standing therex.  alternating lunging onto BOSU (up) ball and TRX side lunges continued today with pt. tolerating these without reported LBP.     PT Treatment/Interventions Therapeutic exercise;Balance training;Gait training   PT Next Visit Plan B Hip stability training; balance/proprioception training      Patient will benefit from skilled therapeutic intervention in order to improve the following deficits and impairments:  Abnormal gait, Difficulty walking, Decreased balance, Decreased strength, Pain  Visit Diagnosis: Other abnormalities of gait and mobility  Difficulty in walking, not elsewhere classified     Problem List Patient Active Problem List   Diagnosis Date Noted  . Hypothyroidism 10/28/2015  . Skin infection 12/28/2014  . Lumbar compression fracture ([place]) 09/14/2014  . Medicare annual wellness visit, subsequent 04/27/2014  . Obesity   . S/P left THA, AA 06/28/2013  . Decreased visual acuity 02/19/2013  . Chronic pain syndrome 01/20/2011  . Depression 08/10/2008  . GERD 04/26/2008  . Lumbar disc disease 05/31/2007  . 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 Hyperlipidemia 05/29/2007  . Tremor   . Essential hypertension     Bess Harvest, PTA 12/21/2015, 12:44 PM  [facility] [address]  [place], [place], [address] Phone: [phone]   Fax:  [phone]  Name: Kimberly Irwin MRN: [id number] Date of Birth: [birth year]

## 2015-12-25 ENCOUNTER — Ambulatory Visit: Payer: Medicare Other

## 2015-12-25 DIAGNOSIS — R2689 Other abnormalities of gait and mobility: Secondary | ICD-10-CM | POA: Diagnosis not present

## 2015-12-25 DIAGNOSIS — R262 Difficulty in walking, not elsewhere classified: Secondary | ICD-10-CM | POA: Diagnosis not present

## 2015-12-25 NOTE — Therapy (Signed)
[facility] [address]  [place], [place], [address] Phone: [phone]   Fax:  [phone]  Physical Therapy Treatment  Patient Details  Name: Kimberly Irwin MRN: [id number] Date of Birth: Dec 15, 1945 Referring Provider: Willette Alma, MD  Encounter Date: 12/25/2015      PT End of Session - 12/25/15 0948    Visit Number 5   Number of Visits 8   Date for PT Re-Evaluation 01/09/16   PT Start Time 0936   PT Stop Time 1020   PT Time Calculation (min) 44 min   Activity Tolerance Patient tolerated treatment well   Behavior During Therapy [facility] for tasks assessed/performed      Past Medical History  Diagnosis Date  . Sarcoidosis ([place])   . Hyperlipidemia   . GERD (gastroesophageal reflux disease)   . Hypertension   . Chronic low back pain   . DDD (degenerative disc disease)     L3-4 with facet arthropathy and stenosis  . Degenerative spondylolisthesis     L4-5 grade 1 with stenosis  . Acute hip pain 10/26/2012  . Bursitis of left hip 10/26/2012  . Pain in joint, lower leg 10/26/2012    left   . Tremors of nervous system   . Hypothyroidism   . Heart murmur     as an infant  . Pneumonia     as a child several times.   . History of hiatal hernia   . Hypothyroidism 10/28/2015    Past Surgical History  Procedure Laterality Date  . Carpal tunnel release Bilateral   . Thyroidectomy    . Back surgery  01/15/09    L3-4 and L4-5 decompressive laminectomy with bilateral L3, L4, and L5 decompressive foraminotomies, more than it would be required for simple interbody fusion alone.  . Back surgery  01/15/09    L3-4 and L4-5 posterior lumbar interbody fusion utilizing tanget interbody allograft wedge, Telamon interbody PEEk cage, and local autografting.  . Back surgery  01/15/09    L3, L4, and L5 posterolateral arthrodesis using segmental pedicle screw fixation and local autografting.  . Joint replacement  09/13/09    Right  Hip, Dr. Alvan Dame  . Total hip arthroplasty Left 06/28/2013    Procedure: LEFT TOTAL  HIP ARTHROPLASTY ANTERIOR APPROACH;  Surgeon: Mauri Pole, MD;  [facility];  Service: Orthopedics;  Laterality: Left;  Marland Kitchen Eye surgery      lens implant  . Colonoscopy    . Esophagogastroduodenoscopy    . Cardiac catheterization    . Anterior lat lumbar fusion Left 08/29/2014    Procedure: EXTREME LEFT LATERAL INTERBODY FUSION LUMBAR TWO-THREE LATERAL PLATE;  Surgeon: Charlie Pitter, MD;  Location: [place] [facility];  Service: Neurosurgery;  Laterality: Left;  Marland Kitchen Kyphoplasty N/A 09/14/2014    Procedure: Lumbar Two Kyphoplasty/Vertebroplasty;  Surgeon: Charlie Pitter, MD;  Location: [place] [facility];  Service: Neurosurgery;  Laterality: N/A;  Lumbar Two Kyphoplasty/Vertebroplasty    There were no vitals filed for this visit.      Subjective Assessment - 12/25/15 0943    Subjective Pt. reports she is currently pain free and the last time she felt pain at the back or hips was while picking up sticks from the yard over the last weekend.     Currently in Pain? No/denies   Pain Score 0-No pain   Multiple Pain Sites No      TODAY'S TREATMENT:  TherEx: NuStep lvl 4, 4'  Hooklying bridge with B hip abd/ER with blue TB x 10 reps  Bridge with adduction ball squeeze between knees x 10 reps  B sidelying clam shell with blue TB x 10 reps each side  Hooklying bridge with hip isometric / ER with blue TB 5 x 3 reps   Neuro Re-ed: BOSU (Up) FW lunge with cross-over reach to red cone on white bolster 3 x 10 each; cones progressively farther away each set  Alternating toe-touch standing on blue foam pad on 8" step x 10 reps TRX DL Squat x 10 reps  TRX Lateral Lunges x 5 reps each way       [facility] PT Assessment - 12/25/15 0957    Assessment   Referring Provider Willette Alma, MD   Next MD Visit July 28th            PT Long Term Goals - 12/14/15 0957    PT LONG TERM GOAL #1   Title pt scores 20/24 or better on DGI by  01/09/16   Status On-going   PT LONG TERM GOAL #2   Title B Hip MMT 4/5 or better all planes by 01/09/16   Status On-going   PT LONG TERM GOAL #3   Title pt independent with HEP for continued progress as necessary by 01/09/16   Status On-going               Plan - 12/25/15 1028    Clinical Impression Statement Pt. reports she has been pain free in the LB and hips since Saturday when she was picking up sticks out of her yard.  Pt. tolerated all lumbopelvic strengthening activity and stepping / reaching activity well today with no pain reported throughout; stepping and cross-over reaching activity added today on BOSU (up) ball with pt. tolerating very well; pt. responds very well to challanging balance activities.       PT Treatment/Interventions Therapeutic exercise;Balance training;Gait training   PT Next Visit Plan B Hip stability training; balance/proprioception training      Patient will benefit from skilled therapeutic intervention in order to improve the following deficits and impairments:  Abnormal gait, Difficulty walking, Decreased balance, Decreased strength, Pain  Visit Diagnosis: Other abnormalities of gait and mobility  Difficulty in walking, not elsewhere classified     Problem List Patient Active Problem List   Diagnosis Date Noted  . Hypothyroidism 10/28/2015  . Skin infection 12/28/2014  . Lumbar compression fracture ([place]) 09/14/2014  . Medicare annual wellness visit, subsequent 04/27/2014  . Obesity   . S/P left THA, AA 06/28/2013  . Decreased visual acuity 02/19/2013  . Chronic pain syndrome 01/20/2011  . Depression 08/10/2008  . GERD 04/26/2008  . Lumbar disc disease 05/31/2007  . 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 Hyperlipidemia 05/29/2007  . Tremor   . Essential hypertension     Bess Harvest, PTA 12/25/2015, 10:33 AM  [facility] [address]  [place], [place], [address] Phone:  [phone]   Fax:  [phone]  Name: Kimberly Irwin MRN: [id number] Date of Birth: 1945/11/22

## 2015-12-28 ENCOUNTER — Ambulatory Visit: Payer: Medicare Other

## 2015-12-28 DIAGNOSIS — R2689 Other abnormalities of gait and mobility: Secondary | ICD-10-CM

## 2015-12-28 DIAGNOSIS — R262 Difficulty in walking, not elsewhere classified: Secondary | ICD-10-CM | POA: Diagnosis not present

## 2015-12-28 NOTE — Therapy (Signed)
[facility] [address]  [place], [place], [address] Phone: [phone]   Fax:  [phone]  Physical Therapy Treatment  Patient Details  Name: Kimberly Irwin MRN: [id number] Date of Birth: 1945/09/02 Referring Provider: Willette Alma, MD  Encounter Date: 12/28/2015      PT End of Session - 12/28/15 0859    Visit Number 6   Number of Visits 8   Date for PT Re-Evaluation 01/09/16   PT Start Time 0849   PT Stop Time 0930   PT Time Calculation (min) 41 min   Activity Tolerance Patient tolerated treatment well   Behavior During Therapy [facility] for tasks assessed/performed      Past Medical History  Diagnosis Date  . Sarcoidosis ([place])   . Hyperlipidemia   . GERD (gastroesophageal reflux disease)   . Hypertension   . Chronic low back pain   . DDD (degenerative disc disease)     L3-4 with facet arthropathy and stenosis  . Degenerative spondylolisthesis     L4-5 grade 1 with stenosis  . Acute hip pain 10/26/2012  . Bursitis of left hip 10/26/2012  . Pain in joint, lower leg 10/26/2012    left   . Tremors of nervous system   . Hypothyroidism   . Heart murmur     as an infant  . Pneumonia     as a child several times.   . History of hiatal hernia   . Hypothyroidism 10/28/2015    Past Surgical History  Procedure Laterality Date  . Carpal tunnel release Bilateral   . Thyroidectomy    . Back surgery  01/15/09    L3-4 and L4-5 decompressive laminectomy with bilateral L3, L4, and L5 decompressive foraminotomies, more than it would be required for simple interbody fusion alone.  . Back surgery  01/15/09    L3-4 and L4-5 posterior lumbar interbody fusion utilizing tanget interbody allograft wedge, Telamon interbody PEEk cage, and local autografting.  . Back surgery  01/15/09    L3, L4, and L5 posterolateral arthrodesis using segmental pedicle screw fixation and local autografting.  . Joint replacement  09/13/09    Right  Hip, Dr. Alvan Dame  . Total hip arthroplasty Left 06/28/2013    Procedure: LEFT TOTAL  HIP ARTHROPLASTY ANTERIOR APPROACH;  Surgeon: Mauri Pole, MD;  [facility];  Service: Orthopedics;  Laterality: Left;  Marland Kitchen Eye surgery      lens implant  . Colonoscopy    . Esophagogastroduodenoscopy    . Cardiac catheterization    . Anterior lat lumbar fusion Left 08/29/2014    Procedure: EXTREME LEFT LATERAL INTERBODY FUSION LUMBAR TWO-THREE LATERAL PLATE;  Surgeon: Charlie Pitter, MD;  Location: [place] [facility];  Service: Neurosurgery;  Laterality: Left;  Marland Kitchen Kyphoplasty N/A 09/14/2014    Procedure: Lumbar Two Kyphoplasty/Vertebroplasty;  Surgeon: Charlie Pitter, MD;  Location: [place] [facility];  Service: Neurosurgery;  Laterality: N/A;  Lumbar Two Kyphoplasty/Vertebroplasty    There were no vitals filed for this visit.      Subjective Assessment - 12/28/15 0852    Subjective Pt. reports she is pain free currently.     Currently in Pain? No/denies   Pain Score 0-No pain   Multiple Pain Sites No      Today's Treatment:  Therex: NuStep: level 5, 4 min  Hooklying bridge x 15 reps;  Hooklying bridge with B hip abd/ER with black TB x 15 reps Hooklying alternating hip abd/ER  with black TB x 10 reps each  B sidelying clam shell with black TB x 10 reps  Standing hip extension, abduction, adduction, flexion with red TB around ankles x 10 reps; mild LBP with extension Alternating stepping toe-touch to 8" step while standing on blue foam pad x 10 reps each  Alternating stepping toe-touch to 8" + 4" steps stacked while standing on blue foam pad x 10 reps each; pt. With no LOB performed very well Alternating lunging onto BOSU (up) ball with cross-over reaching to red cone on white bolster 2 x 10 reps each side; each set with progressive reaching distance         PT Education - 12/28/15 0938    Education provided Yes   Education Details red TB issued for standing hip 4 way, and blue looped T B issued for  supine bridge / clam shell type activities   Comprehension Verbalized understanding;Need further instruction;Returned demonstration             PT Long Term Goals - 12/28/15 0900    PT LONG TERM GOAL #1   Title pt scores 20/24 or better on DGI by 01/09/16   Status On-going   PT LONG TERM GOAL #2   Title B Hip MMT 4/5 or better all planes by 01/09/16   Status On-going   PT LONG TERM GOAL #3   Title pt independent with HEP for continued progress as necessary by 01/09/16   Status Partially Met  12/28/15: pt. independent with initial HEP; red and blue TB issued to pt. today to advance existing HEP activities.                 Plan - 12/28/15 0900    Clinical Impression Statement Pt. reports she is pain free initially today.  Today's treatment focused on updating HEP activities; red TB issued to pt. for 4-way hip HEP activities, and blue looped TB issued for all supine activities.  Stepping/reaching activities advanced today with pt. tolerating very well; pt. progressing toward goals well with two more treatments left in the POC.    PT Treatment/Interventions Therapeutic exercise;Balance training;Gait training   PT Next Visit Plan B Hip stability training; balance/proprioception training      Patient will benefit from skilled therapeutic intervention in order to improve the following deficits and impairments:  Abnormal gait, Difficulty walking, Decreased balance, Decreased strength, Pain  Visit Diagnosis: Other abnormalities of gait and mobility  Difficulty in walking, not elsewhere classified     Problem List Patient Active Problem List   Diagnosis Date Noted  . Hypothyroidism 10/28/2015  . Skin infection 12/28/2014  . Lumbar compression fracture ([place]) 09/14/2014  . Medicare annual wellness visit, subsequent 04/27/2014  . Obesity   . S/P left THA, AA 06/28/2013  . Decreased visual acuity 02/19/2013  . Chronic pain syndrome 01/20/2011  . Depression 08/10/2008  . GERD  04/26/2008  . Lumbar disc disease 05/31/2007  . 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 Hyperlipidemia 05/29/2007  . Tremor   . Essential hypertension     Bess Harvest, PTA 12/28/2015, 9:50 AM  [facility] [address]  [place], [place], [address] Phone: [phone]   Fax:  [phone]  Name: MELAYNA ROBARTS MRN: [id number] Date of Birth: 09-13-1945

## 2016-01-01 ENCOUNTER — Ambulatory Visit: Payer: Medicare Other

## 2016-01-01 DIAGNOSIS — R262 Difficulty in walking, not elsewhere classified: Secondary | ICD-10-CM

## 2016-01-01 DIAGNOSIS — R2689 Other abnormalities of gait and mobility: Secondary | ICD-10-CM | POA: Diagnosis not present

## 2016-01-01 NOTE — Therapy (Signed)
[facility] [address]  [place], [place], [address] Phone: [phone]   Fax:  [phone]  Physical Therapy Treatment  Patient Details  Name: Kimberly Irwin MRN: [id number] Date of Birth: 1946-02-27 Referring Provider: Willette Alma, MD  Encounter Date: 01/01/2016      PT End of Session - 01/01/16 0946    Visit Number 7   Number of Visits 8   Date for PT Re-Evaluation 01/09/16   PT Start Time 0935   PT Stop Time 1015   PT Time Calculation (min) 40 min   Activity Tolerance Patient tolerated treatment well   Behavior During Therapy [facility] for tasks assessed/performed      Past Medical History  Diagnosis Date  . Sarcoidosis ([place])   . Hyperlipidemia   . GERD (gastroesophageal reflux disease)   . Hypertension   . Chronic low back pain   . DDD (degenerative disc disease)     L3-4 with facet arthropathy and stenosis  . Degenerative spondylolisthesis     L4-5 grade 1 with stenosis  . Acute hip pain 10/26/2012  . Bursitis of left hip 10/26/2012  . Pain in joint, lower leg 10/26/2012    left   . Tremors of nervous system   . Hypothyroidism   . Heart murmur     as an infant  . Pneumonia     as a child several times.   . History of hiatal hernia   . Hypothyroidism 10/28/2015    Past Surgical History  Procedure Laterality Date  . Carpal tunnel release Bilateral   . Thyroidectomy    . Back surgery  01/15/09    L3-4 and L4-5 decompressive laminectomy with bilateral L3, L4, and L5 decompressive foraminotomies, more than it would be required for simple interbody fusion alone.  . Back surgery  01/15/09    L3-4 and L4-5 posterior lumbar interbody fusion utilizing tanget interbody allograft wedge, [employer]amon interbody PEEk cage, and local autografting.  . Back surgery  01/15/09    L3, L4, and L5 posterolateral arthrodesis using segmental pedicle screw fixation and local autografting.  . Joint replacement  09/13/09    Right  Hip, Dr. Alvan Dame  . Total hip arthroplasty Left 06/28/2013    Procedure: LEFT TOTAL  HIP ARTHROPLASTY ANTERIOR APPROACH;  Surgeon: Mauri Pole, MD;  [facility];  Service: Orthopedics;  Laterality: Left;  Marland Kitchen Eye surgery      lens implant  . Colonoscopy    . Esophagogastroduodenoscopy    . Cardiac catheterization    . Anterior lat lumbar fusion Left 08/29/2014    Procedure: EXTREME LEFT LATERAL INTERBODY FUSION LUMBAR TWO-THREE LATERAL PLATE;  Surgeon: Charlie Pitter, MD;  Location: [place] [facility];  Service: Neurosurgery;  Laterality: Left;  Marland Kitchen Kyphoplasty N/A 09/14/2014    Procedure: Lumbar Two Kyphoplasty/Vertebroplasty;  Surgeon: Charlie Pitter, MD;  Location: [place] [facility] OR[facility];  Service: Neurosurgery;  Laterality: N/A;  Lumbar Two Kyphoplasty/Vertebroplasty    There were no vitals filed for this visit.      Subjective Assessment - 01/01/16 0940    Subjective Pt. reports she woke up on Saturday morning very sore around the B hips and was unable to perform HEP that day, however recovered over the weekend and is pain free today.     Currently in Pain? No/denies   Pain Score 0-No pain   Multiple Pain Sites No      Today's Treatment:  Therex: NuStep: level 5,  5 min  Hooklying bridge x 15 reps Hooklying bridge with B hip abd/ER with black TB x 10 reps Hooklying alternating hip abd/ER with black TB x 10 reps each  B sidelying clam shell with black TB x 10 reps  Straight leg bridge with heels on peanut p-ball x 10 reps  8" step up x 10 reps each side; no UE assist Supine SLR hip flexion x 10 reps  B sidelying hip abduction raise x 10 reps B SLR prone hip extension x 10 reps  Alternating stepping toe-touch to 8" step while standing on blue foam pad x 10 reps each  Alternating lunging onto BOSU (up) ball with cross-over reaching to red cone on white bolster 2 x 10 reps each side; each set with progressive reaching distance        PT Long Term Goals - 01/01/16 1018    PT LONG TERM  GOAL #1   Title pt scores 20/24 or better on DGI by 01/09/16   Status On-going   PT LONG TERM GOAL #2   Title B Hip MMT 4/5 or better all planes by 01/09/16   Status On-going   PT LONG TERM GOAL #3   Title pt independent with HEP for continued progress as necessary by 01/09/16   Status Achieved  01/01/16: pt. independent with HEP                  Plan - 01/01/16 0947    Clinical Impression Statement Pt. reports she woke up on Saturday morning very sore around the B hips and was unable to perform HEP that day, however recovered over the weekend and is pain free today.  Hip / knee strengthening activity continued today with focus on stepping / balance activity; pt. tolerated all activities well with no LBP.  Pt. progressing very well with pt.'s last treatment next visit.  Plan to perform DGI and assess goals with PT next treatment.     PT Treatment/Interventions Therapeutic exercise;Balance training;Gait training   PT Next Visit Plan Plan to perform DGI and assess goals with PT; B Hip stability training; balance/proprioception training      Patient will benefit from skilled therapeutic intervention in order to improve the following deficits and impairments:  Abnormal gait, Difficulty walking, Decreased balance, Decreased strength, Pain  Visit Diagnosis: Other abnormalities of gait and mobility  Difficulty in walking, not elsewhere classified     Problem List Patient Active Problem List   Diagnosis Date Noted  . Hypothyroidism 10/28/2015  . Skin infection 12/28/2014  . Lumbar compression fracture ([place]) 09/14/2014  . Medicare annual wellness visit, subsequent 04/27/2014  . Obesity   . S/P left THA, AA 06/28/2013  . Decreased visual acuity 02/19/2013  . Chronic pain syndrome 01/20/2011  . Depression 08/10/2008  . GERD 04/26/2008  . Lumbar disc disease 05/31/2007  . 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 Hyperlipidemia 05/29/2007  . Tremor   . Essential hypertension     Bess Harvest, PTA 01/01/2016, 1:09 PM  [facility] [address]  [place], [place], [address] Phone: [phone]   Fax:  [phone]  Name: Kimberly Irwin MRN: [id number] Date of Birth: May 20, 1946

## 2016-01-04 ENCOUNTER — Ambulatory Visit: Payer: Medicare Other | Attending: Family Medicine | Admitting: Physical Therapy

## 2016-01-04 DIAGNOSIS — R2689 Other abnormalities of gait and mobility: Secondary | ICD-10-CM | POA: Diagnosis not present

## 2016-01-04 DIAGNOSIS — R262 Difficulty in walking, not elsewhere classified: Secondary | ICD-10-CM | POA: Diagnosis not present

## 2016-01-04 NOTE — Therapy (Signed)
[facility] [address]  [place], [place], [address] Phone: [phone]   Fax:  [phone]  Physical Therapy Treatment  Patient Details  Name: Kimberly Irwin MRN: [id number] Date of Birth: 11/25/1945 Referring Provider: Willette Alma, MD  Encounter Date: 01/04/2016      PT End of Session - 01/04/16 0952    Visit Number 8   PT Start Time 0926   PT Stop Time 0952   PT Time Calculation (min) 26 min   Activity Tolerance Patient tolerated treatment well   Behavior During Therapy [facility] for tasks assessed/performed      Past Medical History  Diagnosis Date  . Sarcoidosis ([place])   . Hyperlipidemia   . GERD (gastroesophageal reflux disease)   . Hypertension   . Chronic low back pain   . DDD (degenerative disc disease)     L3-4 with facet arthropathy and stenosis  . Degenerative spondylolisthesis     L4-5 grade 1 with stenosis  . Acute hip pain 10/26/2012  . Bursitis of left hip 10/26/2012  . Pain in joint, lower leg 10/26/2012    left   . Tremors of nervous system   . Hypothyroidism   . Heart murmur     as an infant  . Pneumonia     as a child several times.   . History of hiatal hernia   . Hypothyroidism 10/28/2015    Past Surgical History  Procedure Laterality Date  . Carpal tunnel release Bilateral   . Thyroidectomy    . Back surgery  01/15/09    L3-4 and L4-5 decompressive laminectomy with bilateral L3, L4, and L5 decompressive foraminotomies, more than it would be required for simple interbody fusion alone.  . Back surgery  01/15/09    L3-4 and L4-5 posterior lumbar interbody fusion utilizing tanget interbody allograft wedge, Telamon interbody PEEk cage, and local autografting.  . Back surgery  01/15/09    L3, L4, and L5 posterolateral arthrodesis using segmental pedicle screw fixation and local autografting.  . Joint replacement  09/13/09    Right Hip, Dr. Alvan Dame  . Total hip arthroplasty Left 06/28/2013     Procedure: LEFT TOTAL  HIP ARTHROPLASTY ANTERIOR APPROACH;  Surgeon: Mauri Pole, MD;  Location: [facility];  Service: Orthopedics;  Laterality: Left;  Marland Kitchen Eye surgery      lens implant  . Colonoscopy    . Esophagogastroduodenoscopy    . Cardiac catheterization    . Anterior lat lumbar fusion Left 08/29/2014    Procedure: EXTREME LEFT LATERAL INTERBODY FUSION LUMBAR TWO-THREE LATERAL PLATE;  Surgeon: Charlie Pitter, MD;  Location: [place] [facility];  Service: Neurosurgery;  Laterality: Left;  Marland Kitchen Kyphoplasty N/A 09/14/2014    Procedure: Lumbar Two Kyphoplasty/Vertebroplasty;  Surgeon: Charlie Pitter, MD;  Location: [place] [facility];  Service: Neurosurgery;  Laterality: N/A;  Lumbar Two Kyphoplasty/Vertebroplasty    There were no vitals filed for this visit.      Subjective Assessment - 01/04/16 0926    Subjective doing well; ready for d/c today.   Currently in Pain? No/denies            [facility] PT Assessment - 01/04/16 0933    Observation/Other Assessments   Focus on Therapeutic Outcomes (FOTO)  18% limited   Strength   Right Hip Flexion 4+/5   Right Hip Extension 4/5   Right Hip External Rotation  4+/5   Right Hip Internal Rotation 5/5   Right  Hip ABduction 4/5   Right Hip ADduction 5/5   Left Hip Flexion 4+/5   Left Hip Extension 4/5   Left Hip External Rotation 4/5   Left Hip Internal Rotation 5/5   Left Hip ABduction 4/5   Left Hip ADduction 4+/5   Dynamic Gait Index   Level Surface Normal   Change in Gait Speed Normal   Gait with Horizontal Head Turns Mild Impairment   Gait with Vertical Head Turns Mild Impairment   Gait and Pivot Turn Normal   Step Over Obstacle Normal   Step Around Obstacles Normal   Steps Mild Impairment   Total Score 21                     [facility] Adult PT Treatment/Exercise - 02/02/16 0928    Exercises   Exercises Knee/Hip   Knee/Hip Exercises: Aerobic   Nustep L 5 x 6 min                PT Education - February 02, 2016 0952    Education  provided Yes   Education Details reinforced continuation of HEP; progress towards goals   Person(s) Educated Patient   Methods Explanation   Comprehension Verbalized understanding             PT Long Term Goals - 02-Feb-2016 0953    PT LONG TERM GOAL #1   Title pt scores 20/24 or better on DGI by 01/09/16   Status Achieved   PT LONG TERM GOAL #2   Title B Hip MMT 4/5 or better all planes by 01/09/16   Status Achieved   PT LONG TERM GOAL #3   Title pt independent with HEP for continued progress as necessary by 01/09/16   Status Achieved               Plan - 2016-02-02 0952    Clinical Impression Statement Pt has met all goals and ready for d/c.  See d/c summary   PT Next Visit Plan d/c PT      Patient will benefit from skilled therapeutic intervention in order to improve the following deficits and impairments:     Visit Diagnosis: Other abnormalities of gait and mobility  Difficulty in walking, not elsewhere classified       G-Codes - 2016/02/02 0957    Functional Assessment Tool Used FOTO 18% limited   Functional Limitation Mobility: Walking and moving around   Mobility: Walking and Moving Around Goal Status [phone]) At least 1 percent but less than 20 percent impaired, limited or restricted   Mobility: Walking and Moving Around Discharge Status [phone]) At least 1 percent but less than 20 percent impaired, limited or restricted      Problem List Patient Active Problem List   Diagnosis Date Noted  . Hypothyroidism 10/28/2015  . Skin infection 12/28/2014  . Lumbar compression fracture ([place]) 09/14/2014  . Medicare annual wellness visit, subsequent 04/27/2014  . Obesity   . S/P left THA, AA 06/28/2013  . Decreased visual acuity 02/19/2013  . Chronic pain syndrome 01/20/2011  . Depression 08/10/2008  . GERD 04/26/2008  . Lumbar disc disease 05/31/2007  . 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 Hyperlipidemia 05/29/2007  . Tremor   . Essential hypertension     Laureen Abrahams, PT, DPT February 02, 2016 9:58 AM  [facility] [address]  [place], [place], [address] Phone: [phone]   Fax:  [phone]  Name: Kimberly Marshman  Irwin MRN: [id number] Date of Birth: 12-Apr-1946       PHYSICAL THERAPY DISCHARGE SUMMARY  Visits from Start of Care: 8  Current functional level related to goals / functional outcomes: See above; all goals met    Remaining deficits: Pt has some mild strength deficits but has HEP in place to address strength and balance.  Overall, fall risk decreased as indicated by DGI of 21/24   Education / Equipment: HEP  Plan: Patient agrees to discharge.  Patient goals were met. Patient is being discharged due to meeting the stated rehab goals.  ?????     Laureen Abrahams, PT, DPT 01/04/2016 9:58 AM  [facility] [place], [place] [address]  [phone] (office) [phone] (fax)

## 2016-01-29 ENCOUNTER — Encounter (INDEPENDENT_AMBULATORY_CARE_PROVIDER_SITE_OTHER): Payer: Medicare Other | Admitting: Ophthalmology

## 2016-01-29 DIAGNOSIS — H35033 Hypertensive retinopathy, bilateral: Secondary | ICD-10-CM

## 2016-01-29 DIAGNOSIS — H34832 Tributary (branch) retinal vein occlusion, left eye, with macular edema: Secondary | ICD-10-CM

## 2016-01-29 DIAGNOSIS — H43813 Vitreous degeneration, bilateral: Secondary | ICD-10-CM | POA: Diagnosis not present

## 2016-01-29 DIAGNOSIS — I10 Essential (primary) hypertension: Secondary | ICD-10-CM

## 2016-02-14 IMAGING — RF DG C-ARM 61-120 MIN
1 series · 2 of 2 positions shown · non-contrast
Comparison: [HOSPITAL] [REDACTED] MRI 08/02/2014 and
lumbar spine radiographs 08/16/2014, available on [HOSPITAL] PACS for
review

CLINICAL DATA: L2-L3 lumbar fusion

EXAM:
DG C-ARM 61-120 MIN; LUMBAR SPINE - 2-3 VIEW

[Series 1: run · 2 of 2 slices shown]
[im 1/2]
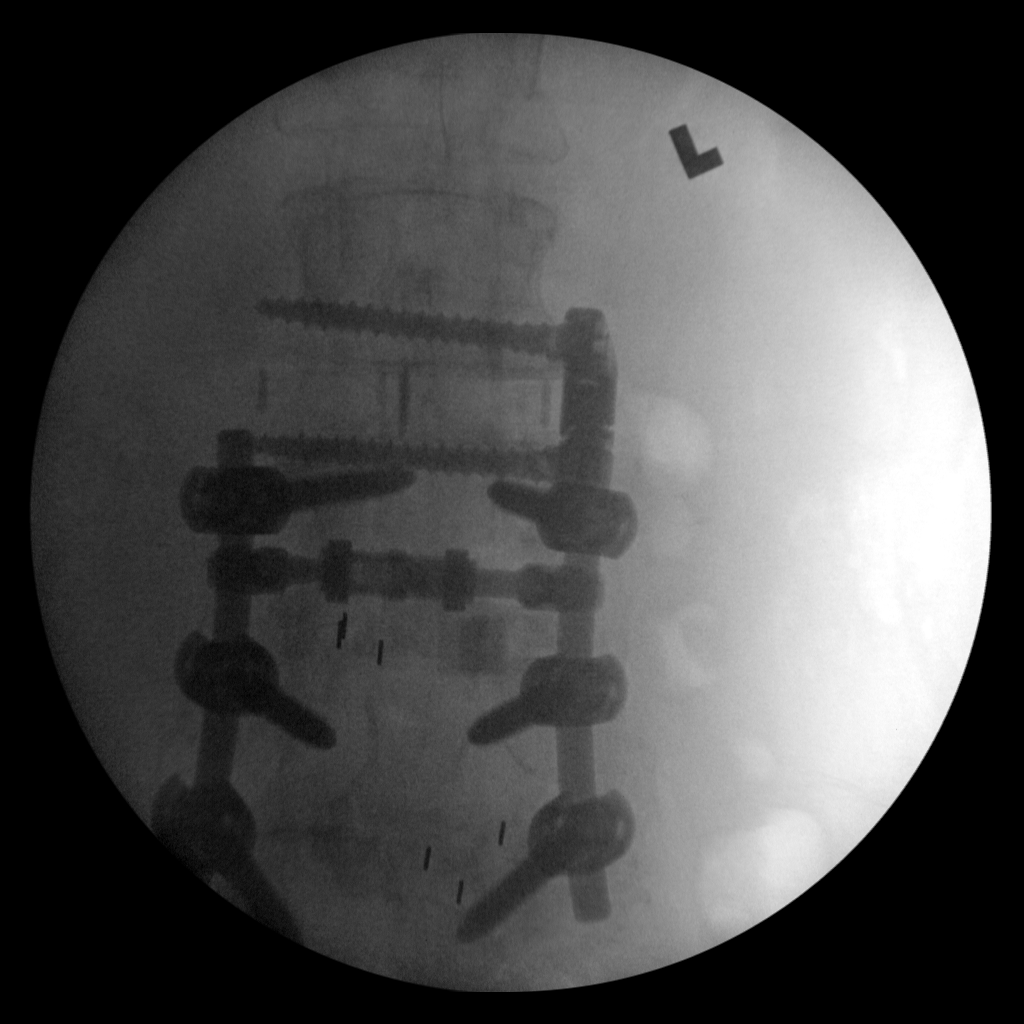
[im 2/2]
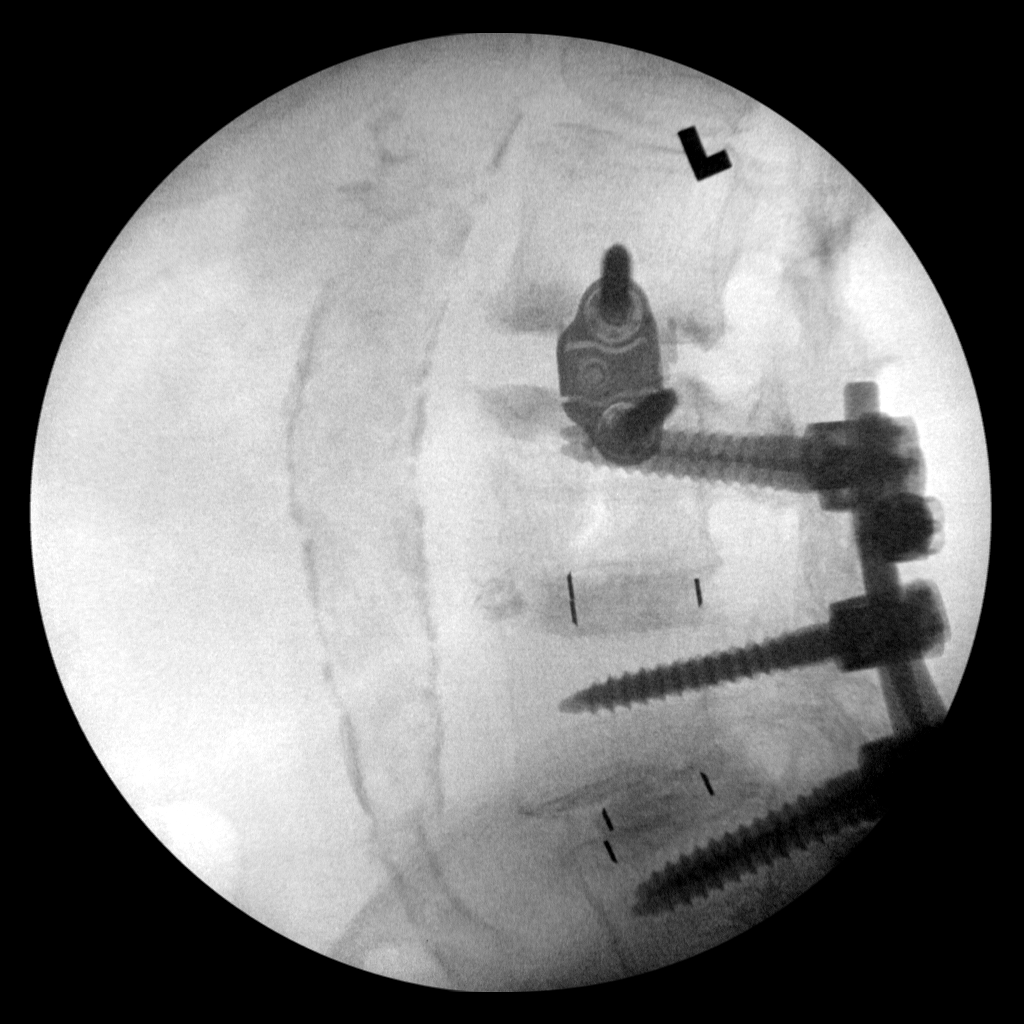

[2 of 2 positions shown; findings below may reference images not displayed]

FINDINGS: Levels are as per previous MRI numbering. Two intraoperative
fluoroscopic images demonstrate placement of fixation device at
L2-L3. Posterior fusion hardware spanning L3-L5 reidentified. No
evidence for hardware failure. Atheromatous aortic calcification
without calcified aneurysm.
IMPRESSION: Expected intraoperative appearance after L2-L3 fusion.

## 2016-02-29 ENCOUNTER — Ambulatory Visit: Payer: PRIVATE HEALTH INSURANCE | Admitting: Family Medicine

## 2016-02-29 ENCOUNTER — Other Ambulatory Visit: Payer: Self-pay | Admitting: Family Medicine

## 2016-03-25 ENCOUNTER — Encounter (INDEPENDENT_AMBULATORY_CARE_PROVIDER_SITE_OTHER): Payer: Medicare Other | Admitting: Ophthalmology

## 2016-03-25 DIAGNOSIS — I10 Essential (primary) hypertension: Secondary | ICD-10-CM | POA: Diagnosis not present

## 2016-03-25 DIAGNOSIS — H43813 Vitreous degeneration, bilateral: Secondary | ICD-10-CM

## 2016-03-25 DIAGNOSIS — H34832 Tributary (branch) retinal vein occlusion, left eye, with macular edema: Secondary | ICD-10-CM | POA: Diagnosis not present

## 2016-03-25 DIAGNOSIS — H35033 Hypertensive retinopathy, bilateral: Secondary | ICD-10-CM

## 2016-03-30 ENCOUNTER — Other Ambulatory Visit: Payer: Self-pay | Admitting: Family Medicine

## 2016-04-03 ENCOUNTER — Ambulatory Visit (INDEPENDENT_AMBULATORY_CARE_PROVIDER_SITE_OTHER): Payer: Medicare Other | Admitting: Family Medicine

## 2016-04-03 ENCOUNTER — Encounter: Payer: Self-pay | Admitting: Family Medicine

## 2016-04-03 ENCOUNTER — Telehealth: Payer: Self-pay | Admitting: Family Medicine

## 2016-04-03 VITALS — BP 122/78 | HR 66 | Temp 98.2°F | Wt 175.6 lb

## 2016-04-03 DIAGNOSIS — Z23 Encounter for immunization: Secondary | ICD-10-CM | POA: Diagnosis not present

## 2016-04-03 DIAGNOSIS — I10 Essential (primary) hypertension: Secondary | ICD-10-CM | POA: Diagnosis not present

## 2016-04-03 DIAGNOSIS — E785 Hyperlipidemia, unspecified: Secondary | ICD-10-CM | POA: Diagnosis not present

## 2016-04-03 DIAGNOSIS — L309 Dermatitis, unspecified: Secondary | ICD-10-CM

## 2016-04-03 DIAGNOSIS — R739 Hyperglycemia, unspecified: Secondary | ICD-10-CM

## 2016-04-03 DIAGNOSIS — D869 Sarcoidosis, unspecified: Secondary | ICD-10-CM | POA: Diagnosis not present

## 2016-04-03 DIAGNOSIS — E669 Obesity, unspecified: Secondary | ICD-10-CM

## 2016-04-03 DIAGNOSIS — S32000S Wedge compression fracture of unspecified lumbar vertebra, sequela: Secondary | ICD-10-CM

## 2016-04-03 DIAGNOSIS — E039 Hypothyroidism, unspecified: Secondary | ICD-10-CM

## 2016-04-03 DIAGNOSIS — R0602 Shortness of breath: Secondary | ICD-10-CM

## 2016-04-03 LAB — LDL CHOLESTEROL, DIRECT: LDL DIRECT: 126 mg/dL

## 2016-04-03 LAB — COMPREHENSIVE METABOLIC PANEL
ALK PHOS: 56 U/L (ref 39–117)
ALT: 25 U/L (ref 0–35)
AST: 26 U/L (ref 0–37)
Albumin: 4.2 g/dL (ref 3.5–5.2)
BUN: 14 mg/dL (ref 6–23)
CALCIUM: 9.7 mg/dL (ref 8.4–10.5)
CO2: 31 mEq/L (ref 19–32)
Chloride: 101 mEq/L (ref 96–112)
Creatinine, Ser: 0.85 mg/dL (ref 0.40–1.20)
GFR: 69.88 mL/min (ref >60.00)
GLUCOSE: 113 mg/dL — AB (ref 70–99)
POTASSIUM: 3.8 meq/L (ref 3.5–5.1)
Sodium: 137 mEq/L (ref 135–145)
TOTAL PROTEIN: 7.5 g/dL (ref 6.0–8.3)
Total Bilirubin: 0.7 mg/dL (ref 0.2–1.2)

## 2016-04-03 LAB — CBC
HCT: 45.2 % (ref 36.0–46.0)
Hemoglobin: 15.7 g/dL — ABNORMAL HIGH (ref 12.0–15.0)
MCHC: 34.7 g/dL (ref 30.0–36.0)
MCV: 88.1 fl (ref 78.0–100.0)
PLATELETS: 232 10*3/uL (ref 150.0–400.0)
RBC: 5.13 Mil/uL — AB (ref 3.87–5.11)
RDW: 13.6 % (ref 11.5–15.5)
WBC: 11.1 10*3/uL — ABNORMAL HIGH (ref 4.0–10.5)

## 2016-04-03 LAB — LIPID PANEL
CHOL/HDL RATIO: 5
Cholesterol: 193 mg/dL (ref 0–200)
HDL: 41.7 mg/dL (ref >39.00)
NONHDL: 151.75
TRIGLYCERIDES: 246 mg/dL — AB (ref 0.0–149.0)
VLDL: 49.2 mg/dL — ABNORMAL HIGH (ref 0.0–40.0)

## 2016-04-03 LAB — TSH: TSH: 0.59 u[IU]/mL (ref 0.35–4.50)

## 2016-04-03 MED ORDER — CLOTRIMAZOLE 1 % EX CREA: 1.0000 "application " | TOPICAL_CREAM | Freq: Two times a day (BID) | CUTANEOUS | 1 refills | Status: DC

## 2016-04-03 MED ORDER — OMEPRAZOLE 40 MG PO CPDR: 40.0000 mg | DELAYED_RELEASE_CAPSULE | Freq: Every day | ORAL | 3 refills | Status: DC

## 2016-04-03 MED ORDER — RANITIDINE HCL 150 MG PO TABS: 150.0000 mg | ORAL_TABLET | Freq: Every evening | ORAL | 2 refills | Status: DC | PRN

## 2016-04-03 NOTE — Progress Notes (Signed)
Pre visit review using our clinic review tool, if applicable. No additional management support is needed unless otherwise documented below in the visit note. 

## 2016-04-03 NOTE — Telephone Encounter (Signed)
I called pt to notify of appt 9/6 for Echo here at [facility]. Pt asked if she should do CXR at the same time. She stated she didn't know she could have done it today. Should she come back to get chest xray sooner?

## 2016-04-03 NOTE — Assessment & Plan Note (Signed)
Well controlled, no changes to meds. Encouraged heart healthy diet such as the DASH diet and exercise as tolerated.

## 2016-04-03 NOTE — Patient Instructions (Signed)

## 2016-04-03 NOTE — Telephone Encounter (Signed)
Patient informed

## 2016-04-03 NOTE — Telephone Encounter (Signed)
No that is fine it can be done on the 6th she is not acute. If she gets more sob she can come in any time they are open and do itr

## 2016-04-04 ENCOUNTER — Other Ambulatory Visit: Payer: Self-pay | Admitting: Family Medicine

## 2016-04-04 ENCOUNTER — Ambulatory Visit (INDEPENDENT_AMBULATORY_CARE_PROVIDER_SITE_OTHER): Payer: Medicare Other | Admitting: Pulmonary Disease

## 2016-04-04 ENCOUNTER — Encounter: Payer: Self-pay | Admitting: Pulmonary Disease

## 2016-04-04 VITALS — BP 138/83 | HR 70 | Ht 64.0 in | Wt 175.0 lb

## 2016-04-04 DIAGNOSIS — D869 Sarcoidosis, unspecified: Secondary | ICD-10-CM

## 2016-04-04 DIAGNOSIS — R0609 Other forms of dyspnea: Secondary | ICD-10-CM

## 2016-04-04 DIAGNOSIS — E669 Obesity, unspecified: Secondary | ICD-10-CM | POA: Diagnosis not present

## 2016-04-04 DIAGNOSIS — R0600 Dyspnea, unspecified: Secondary | ICD-10-CM

## 2016-04-04 MED ORDER — ROSUVASTATIN CALCIUM 10 MG PO TABS: 10.0000 mg | ORAL_TABLET | Freq: Every day | ORAL | 6 refills | Status: DC

## 2016-04-04 NOTE — Assessment & Plan Note (Signed)
Weight reduction

## 2016-04-04 NOTE — Assessment & Plan Note (Signed)
Patient with  recent exertional dyspnea with doing more than ADLs. Denies fevers, chills, cough, orthopnea. No recent weight gain. Dyspnea probably started as she finished her outpatient rehabilitation for her hips.  Patient is  known to have stage I sarcoidosis which has been stable. Last seen by Dr.Wright in 2014 and PFT and CXR were N.   She has a cardiologist and according to the patient, her heart has been stable. She was recently cleared in January 2017 for back surgery which she tolerated well.  Differentials: 1. I doubt but it could be related to sarcoidosis flare. I will hold off on starting medicines at this point. Will need PFTs, chest x-ray. If chest x-ray is abnormal, will need chest CT scan. 2. Possible diastolic dysfunction. Primary care doctor has ordered 2-D echo. 3. Deconditioning. Her dyspnea started just as she finished her rehabilitation for her hips.

## 2016-04-04 NOTE — Progress Notes (Signed)
Subjective:    Patient ID: Kimberly Irwin, female    DOB: 1945-09-08, 70 y.o.   MRN: [id number]  HPI   This is the case of Kimberly Irwin, 70 y.o. Female, who was referred by Dr. Penni Homans in consultation regarding Sarcoidosis and dyspnea.   As you very well know, patient was diagnosed with sarcoidosis 20 yrs ago. She has a 15 PY smoking history, quit in her 57s.  Has not been dxed with asthma or COPD. Pt had mediastinoscopy roughly 20 yrs ago and allegedly had granulomas c/w granulomas.   She was seeing Dr. Joya Gaskins and the last time she saw him was in 2014. 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 Note current chest x-ray was negative for disease activity. Note pulmonary functions were normal including normal diffusion capacity and lung volumes. Note ACE level was normal.  Patient was deemed not a candidate for systemic treatment as she was asymptomatic.  Through the years, patient has remained stable from her lung point of view. She currently sees a cardiologist who according to the patient, has cleared her for several of her back surgeries. The last back surgery she had was January, 2017.  Patient recently finished rehabilitation for her hips.  She started noticing exertional dyspnea with doing more than her ADLs the last 3-4 weeks. I think this was coincident with the time that she had finished her rehabilitation. She denies being sick when it started. Dyspnea is stable. She does not feel too debilitated to do her usual ADLs. She denies fevers, chills, chest pain, orthopnea. She actually lost weight the last month. She was concerned about the dyspnea may be caused by sarcoidosis that's why she ended up being seen by me today.    Review of Systems  Constitutional: Negative.  Negative for fever and unexpected weight change.  HENT: Positive for congestion, postnasal drip and rhinorrhea. Negative for dental problem, ear  pain, nosebleeds, sinus pressure, sneezing, sore throat and trouble swallowing.   Eyes: Negative.  Negative for redness and itching.  Respiratory: Positive for shortness of breath. Negative for cough, chest tightness and wheezing.   Cardiovascular: Negative.  Negative for palpitations and leg swelling.  Gastrointestinal: Negative.  Negative for nausea and vomiting.  Endocrine: Negative.   Genitourinary: Negative.  Negative for dysuria.  Musculoskeletal: Positive for arthralgias and back pain. Negative for joint swelling.  Skin: Negative.  Negative for rash.  Allergic/Immunologic: Positive for environmental allergies.  Neurological: Positive for headaches.  Hematological: Negative.  Does not bruise/bleed easily.  Psychiatric/Behavioral: Negative.  Negative for dysphoric mood. The patient is not nervous/anxious.    Past Medical History:  Diagnosis Date  . Acute hip pain 10/26/2012  . Bursitis of left hip 10/26/2012  . Chronic low back pain   . DDD (degenerative disc disease)    L3-4 with facet arthropathy and stenosis  . Degenerative spondylolisthesis    L4-5 grade 1 with stenosis  . GERD (gastroesophageal reflux disease)   . Heart murmur    as an infant  . History of hiatal hernia   . Hyperlipidemia   . Hypertension   . Hypothyroidism   . Hypothyroidism 10/28/2015  . Pain in joint, lower leg 10/26/2012   left   . Pneumonia    as a child several times.   . Sarcoidosis ([place])   . SOB (shortness of breath) 04/03/2016  . Tremors of nervous system    (-) CAD, CA,  DVT  Family History  Problem Relation Age of Onset  . Cancer Mother 55    Breast  . Heart disease Mother   . Hypertension Mother   . Hyperlipidemia Mother   . Heart attack Mother   . Asthma Maternal Grandmother      Past Surgical History:  Procedure Laterality Date  . ANTERIOR LAT LUMBAR FUSION Left 08/29/2014   Procedure: EXTREME LEFT LATERAL INTERBODY FUSION LUMBAR TWO-THREE LATERAL PLATE;  Surgeon: Charlie Pitter,  MD;  Location: [facility];  Service: Neurosurgery;  Laterality: Left;  . BACK SURGERY  01/15/09   L3-4 and L4-5 decompressive laminectomy with bilateral L3, L4, and L5 decompressive foraminotomies, more than it would be required for simple interbody fusion alone.  Marland Kitchen BACK SURGERY  01/15/09   L3-4 and L4-5 posterior lumbar interbody fusion utilizing tanget interbody allograft wedge, Telamon interbody PEEk cage, and local autografting.  Marland Kitchen BACK SURGERY  01/15/09   L3, L4, and L5 posterolateral arthrodesis using segmental pedicle screw fixation and local autografting.  Marland Kitchen CARDIAC CATHETERIZATION    . CARPAL TUNNEL RELEASE Bilateral   . COLONOSCOPY    . ESOPHAGOGASTRODUODENOSCOPY    . EYE SURGERY     lens implant  . JOINT REPLACEMENT  09/13/09   Right Hip, Dr. Alvan Dame  . KYPHOPLASTY N/A 09/14/2014   Procedure: Lumbar Two Kyphoplasty/Vertebroplasty;  Surgeon: Charlie Pitter, MD;  Location: [facility];  Service: Neurosurgery;  Laterality: N/A;  Lumbar Two Kyphoplasty/Vertebroplasty  . THYROIDECTOMY    . TOTAL HIP ARTHROPLASTY Left 06/28/2013   Procedure: LEFT TOTAL  HIP ARTHROPLASTY ANTERIOR APPROACH;  Surgeon: Mauri Pole, MD;  Location: [facility];  Service: Orthopedics;  Laterality: Left;    Social History   Social History  . Marital status: Widowed    Spouse name: N/A  . Number of children: 2  . Years of education: N/A   Occupational History  . Not on file.   Social History Main Topics  . Smoking status: Former Smoker    Packs/day: 0.50    Years: 32.00    Types: Cigarettes    Quit date: 07/22/1994  . Smokeless tobacco: Never Used  . Alcohol use No  . Drug use: No  . Sexual activity: Not on file   Other Topics Concern  . Not on file   Social History Narrative   Married 74 years and has 2 children (2 daughters)   Alcohol Use - no   Former Smoker - she quit 10 years ago, started when she was 7 and has had varying level of use of tobacco products from 1/2 pack to 3 packs per day.     Lives in [place], daughter stays with her. Worked in the [employer].   Allergies  Allergen Reactions  . Lipitor [Atorvastatin] Swelling  . Metronidazole Swelling  . Penicillins Swelling  . Pregabalin Other (See Comments)    REACTION: Somnolence and dizziness  . Simvastatin Other (See Comments)    Leg pain     Outpatient Medications Prior to Visit  Medication Sig Dispense Refill  . aspirin EC 81 MG tablet Take 81 mg by mouth daily.    Marland Kitchen atenolol (TENORMIN) 50 MG tablet TAKE 1 TABLET (50 MG TOTAL) BY MOUTH DAILY. 90 tablet 1  . BESIVANCE 0.6 % SUSP Reported on 07/23/2015  12  . Calcium Carbonate-Vit D-Min (CALCIUM 1200 PO) Take 1,200 mg by mouth daily.     . Cholecalciferol (VITAMIN D) 2000 UNITS tablet Take 2,000 Units  by mouth daily.    . Cinnamon 500 MG capsule Take 2,000 mg by mouth daily.      . citalopram (CELEXA) 10 MG tablet TAKE 1 TABLET (10 MG TOTAL) BY MOUTH DAILY. 90 tablet 1  . clotrimazole (CLOTRIMAZOLE AF) 1 % cream Apply 1 application topically 2 (two) times daily. 113 g 1  . Coenzyme Q10 200 MG capsule Take 200 mg by mouth daily.    . fexofenadine-pseudoephedrine (ALLEGRA-D 24) 180-240 MG 24 hr tablet Take 1 tablet by mouth daily.    Marland Kitchen gabapentin (NEURONTIN) 300 MG capsule Take 1 capsule (300 mg total) by mouth at bedtime. 90 capsule 2  . Glucosamine HCl (GLUCOSAMINE PO) Take by mouth daily.    . hydrochlorothiazide (HYDRODIURIL) 25 MG tablet TAKE 1 TABLET (25 MG TOTAL) BY MOUTH DAILY. 90 tablet 1  . levothyroxine (SYNTHROID, LEVOTHROID) 112 MCG tablet TAKE 1 TABLET (112 MCG TOTAL) BY MOUTH DAILY BEFORE BREAKFAST. 90 tablet 1  . montelukast (SINGULAIR) 10 MG tablet TAKE 1 TABLET (10 MG TOTAL) BY MOUTH AT BEDTIME. 30 tablet 3  . mupirocin ointment (BACTROBAN) 2 % Apply twice daily to bridge of the nose. 22 g 0  . Omega-3 Fatty Acids (FISH OIL) 1200 MG CAPS Take 2,400 mg by mouth daily.     Marland Kitchen omeprazole (PRILOSEC) 40 MG capsule Take 1 capsule (40 mg total) by mouth  daily. 30 capsule 3  . ranitidine (ZANTAC) 150 MG tablet Take 1-2 tablets (150-300 mg total) by mouth at bedtime as needed for heartburn. 60 tablet 2  . rosuvastatin (CRESTOR) 10 MG tablet Take 10 mg by mouth daily.    . traMADol (ULTRAM) 50 MG tablet Take 2 tablets (100 mg total) by mouth every 6 (six) hours as needed for moderate pain or severe pain. 120 tablet 0  . vitamin B-12 (CYANOCOBALAMIN) 1000 MCG tablet Take 1,000 mcg by mouth daily.    . CRESTOR 10 MG tablet TAKE 1 TABLET (10 MG TOTAL) BY MOUTH DAILY. TAKE 1 TABLET ON SATURDAY AND 1/2 TAB DAILY (Patient taking differently: No sig reported) 30 tablet 6   No facility-administered medications prior to visit.    No orders of the defined types were placed in this encounter.        Objective:   Physical Exam   Vitals:  Vitals:   04/04/16 1026  BP: 138/83  Pulse: 70  SpO2: 94%  Weight: 175 lb (79.4 kg)  Height: _0  (1.626 m)    Constitutional/General:  Pleasant, well-nourished, well-developed, not in any distress,  Comfortably seating.  Well kempt  Body mass index is 30.04 kg/m. Wt Readings from Last 3 Encounters:  04/04/16 175 lb (79.4 kg)  04/03/16 175 lb 9.6 oz (79.7 kg)  10/26/15 173 lb 4 oz (78.6 kg)    HEENT: Pupils equal and reactive to light and accommodation. Anicteric sclerae. Normal nasal mucosa.   No oral  lesions,  mouth clear,  oropharynx clear, no postnasal drip. (-) Oral thrush. No dental caries.  Airway - Mallampati class III  Neck: No masses. Midline trachea. No JVD, (-) LAD. (-) bruits appreciated.  Respiratory/Chest: Grossly normal chest. (-) deformity. (-) Accessory muscle use.  Symmetric expansion. (-) Tenderness on palpation.  Resonant on percussion.  Diminished BS on both lower lung zones. (-) wheezing, crackles, rhonchi (-) egophony  Cardiovascular: Regular rate and  rhythm, heart sounds normal, no murmur or gallops, no peripheral edema  Gastrointestinal:  Normal bowel sounds.  Soft, non-tender. No hepatosplenomegaly.  (-)  masses.   Musculoskeletal:  Normal muscle tone. Normal gait.   Extremities: Grossly normal. (-) clubbing, cyanosis.  (-) edema  Skin: (-) rash,lesions seen.   Neurological/Psychiatric : alert, oriented to time, place, person. Normal mood and affect         Assessment & Plan:  Exertional dyspnea Patient with  recent exertional dyspnea with doing more than ADLs. Denies fevers, chills, cough, orthopnea. No recent weight gain. Dyspnea probably started as she finished her outpatient rehabilitation for her hips.  Patient is  known to have stage I sarcoidosis which has been stable. Last seen by Dr.Wright in 2014 and PFT and CXR were N.   She has a cardiologist and according to the patient, her heart has been stable. She was recently cleared in January 2017 for back surgery which she tolerated well.  Differentials: 1. I doubt but it could be related to sarcoidosis flare. I will hold off on starting medicines at this point. Will need PFTs, chest x-ray. If chest x-ray is abnormal, will need chest CT scan. 2. Possible diastolic dysfunction. Primary care doctor has ordered 2-D echo. 3. Deconditioning. Her dyspnea started just as she finished her rehabilitation for her hips.   Obesity Weight reduction  Sarcoidosis stage I Patient had mediastinoscopy 20 years ago and allegedly had granulomas consistent with sarcoidosis. Sarcoidosis unclear stage with negative chest x-ray 2011, 2014 Pulmonary functions 10/18/2012: FEV1 84% FVC 83% total lung capacity 86% diffusion capacity 56% Recent dyspnea and not sure if it's related to sarcoidosis flare. Plan : 1. CXR. May need Chest ct scan depending on CXR 2. PFTs.       Thank you very much for letting me participate in this patient's care. Please do not hesitate to give me a call if you have any questions or concerns regarding the treatment plan.   Patient will follow up with me in 4-6 weeks.      Monica Becton, MD 04/04/2016   11:25 AM [facility] [place] Pager: [phone] Office: [phone], Fax: [phone]

## 2016-04-04 NOTE — Telephone Encounter (Signed)
Patient informed of lab results.

## 2016-04-04 NOTE — Assessment & Plan Note (Signed)
Patient had mediastinoscopy 20 years ago and allegedly had granulomas consistent with sarcoidosis. Sarcoidosis unclear stage with negative chest x-ray 2011, 2014 Pulmonary functions 10/18/2012: FEV1 84% FVC 83% total lung capacity 86% diffusion capacity 56% Recent dyspnea and not sure if it's related to sarcoidosis flare. Plan : 1. CXR. May need Chest ct scan depending on CXR 2. PFTs.

## 2016-04-04 NOTE — Telephone Encounter (Signed)
Pt is returning your call

## 2016-04-04 NOTE — Patient Instructions (Signed)
It was a pleasure taking care of you today!  You are diagnosed with Sarcoidosis.   We will schedule you for a breathing test. Please do your chest x-ray and 2-D echo as ordered. We will hold off on medicines pending test results.  Call the office if you're having worse exertional dyspnea.  Return to clinic in 4-6 weeks.

## 2016-04-09 ENCOUNTER — Ambulatory Visit (HOSPITAL_BASED_OUTPATIENT_CLINIC_OR_DEPARTMENT_OTHER)
Admission: RE | Admit: 2016-04-09 | Discharge: 2016-04-09 | Disposition: A | Discharge status: Home / Self Care | Payer: Medicare Other | Source: Ambulatory Visit | Attending: Family Medicine | Admitting: Family Medicine

## 2016-04-09 DIAGNOSIS — I119 Hypertensive heart disease without heart failure: Secondary | ICD-10-CM | POA: Diagnosis not present

## 2016-04-09 DIAGNOSIS — I10 Essential (primary) hypertension: Secondary | ICD-10-CM | POA: Diagnosis not present

## 2016-04-09 DIAGNOSIS — I340 Nonrheumatic mitral (valve) insufficiency: Secondary | ICD-10-CM | POA: Insufficient documentation

## 2016-04-09 DIAGNOSIS — I071 Rheumatic tricuspid insufficiency: Secondary | ICD-10-CM | POA: Diagnosis not present

## 2016-04-09 DIAGNOSIS — R0602 Shortness of breath: Secondary | ICD-10-CM

## 2016-04-09 NOTE — Progress Notes (Signed)
Echocardiogram 2D Echocardiogram has been performed.  Kimberly Irwin 04/09/2016, 10:53 AM

## 2016-04-14 DIAGNOSIS — L309 Dermatitis, unspecified: Secondary | ICD-10-CM | POA: Insufficient documentation

## 2016-04-14 NOTE — Assessment & Plan Note (Signed)
Worsening recently, improves with rest. Proceed with Echo and referred to pulmonology

## 2016-04-14 NOTE — Progress Notes (Signed)
Patient ID: Kimberly Irwin, female   DOB: 07-Jul-1946, 70 y.o.   MRN: [id number]   Subjective:    Patient ID: Kimberly Irwin, female    DOB: 01-11-1946, 70 y.o.   MRN: [id number]  Chief Complaint  Patient presents with  . Follow-up    HPI Patient is in today for follow up. Is complaining of worsening SOB with exertion  She notes with a shorter distance she will become winded and have to rest. With brief rest she is better. No associated symptoms at that time. She has had some separate episodes of discomofrt in left axaillae only when watching TV and only a couple times over past 2 weeks. Improves with position changes and also has no associated symptoms at that time. Denies palp/HA/congestion/fevers/GI or GU c/o. Taking meds as prescribed  Past Medical History:  Diagnosis Date  . Acute hip pain 10/26/2012  . Bursitis of left hip 10/26/2012  . Chronic low back pain   . DDD (degenerative disc disease)    L3-4 with facet arthropathy and stenosis  . Degenerative spondylolisthesis    L4-5 grade 1 with stenosis  . GERD (gastroesophageal reflux disease)   . Heart murmur    as an infant  . History of hiatal hernia   . Hyperlipidemia   . Hypertension   . Hypothyroidism   . Hypothyroidism 10/28/2015  . Pain in joint, lower leg 10/26/2012   left   . Pneumonia    as a child several times.   . Sarcoidosis ([place])   . SOB (shortness of breath) 04/03/2016  . Tremors of nervous system     Past Surgical History:  Procedure Laterality Date  . ANTERIOR LAT LUMBAR FUSION Left 08/29/2014   Procedure: EXTREME LEFT LATERAL INTERBODY FUSION LUMBAR TWO-THREE LATERAL PLATE;  Surgeon: Charlie Pitter, MD;  Location: [facility];  Service: Neurosurgery;  Laterality: Left;  . BACK SURGERY  01/15/09   L3-4 and L4-5 decompressive laminectomy with bilateral L3, L4, and L5 decompressive foraminotomies, more than it would be required for simple interbody fusion alone.  Marland Kitchen BACK SURGERY  01/15/09   L3-4 and L4-5 posterior  lumbar interbody fusion utilizing tanget interbody allograft wedge, Telamon interbody PEEk cage, and local autografting.  Marland Kitchen BACK SURGERY  01/15/09   L3, L4, and L5 posterolateral arthrodesis using segmental pedicle screw fixation and local autografting.  Marland Kitchen CARDIAC CATHETERIZATION    . CARPAL TUNNEL RELEASE Bilateral   . COLONOSCOPY    . ESOPHAGOGASTRODUODENOSCOPY    . EYE SURGERY     lens implant  . JOINT REPLACEMENT  09/13/09   Right Hip, Dr. Alvan Dame  . KYPHOPLASTY N/A 09/14/2014   Procedure: Lumbar Two Kyphoplasty/Vertebroplasty;  Surgeon: Charlie Pitter, MD;  Location: [facility];  Service: Neurosurgery;  Laterality: N/A;  Lumbar Two Kyphoplasty/Vertebroplasty  . THYROIDECTOMY    . TOTAL HIP ARTHROPLASTY Left 06/28/2013   Procedure: LEFT TOTAL  HIP ARTHROPLASTY ANTERIOR APPROACH;  Surgeon: Mauri Pole, MD;  Location: [facility];  Service: Orthopedics;  Laterality: Left;    Family History  Problem Relation Age of Onset  . Cancer Mother 29    Breast  . Heart disease Mother   . Hypertension Mother   . Hyperlipidemia Mother   . Heart attack Mother   . Asthma Maternal Grandmother     Social History   Social History  . Marital status: Widowed    Spouse name: N/A  . Number of children: 2  . Years of education:  N/A   Occupational History  . Not on file.   Social History Main Topics  . Smoking status: Former Smoker    Packs/day: 0.50    Years: 32.00    Types: Cigarettes    Quit date: 07/22/1994  . Smokeless tobacco: Never Used  . Alcohol use No  . Drug use: No  . Sexual activity: Not on file   Other Topics Concern  . Not on file   Social History Narrative   Married 88 years and has 2 children (2 daughters)   Alcohol Use - no   Former Smoker - she quit 10 years ago, started when she was 16 and has had varying level of use of tobacco products from 1/2 pack to 3 packs per day.    Outpatient Medications Prior to Visit  Medication Sig Dispense Refill  . aspirin EC 81 MG  tablet Take 81 mg by mouth daily.    Marland Kitchen atenolol (TENORMIN) 50 MG tablet TAKE 1 TABLET (50 MG TOTAL) BY MOUTH DAILY. 90 tablet 1  . BESIVANCE 0.6 % SUSP Reported on 07/23/2015  12  . Calcium Carbonate-Vit D-Min (CALCIUM 1200 PO) Take 1,200 mg by mouth daily.     . Cholecalciferol (VITAMIN D) 2000 UNITS tablet Take 2,000 Units by mouth daily.    . Cinnamon 500 MG capsule Take 2,000 mg by mouth daily.      . citalopram (CELEXA) 10 MG tablet TAKE 1 TABLET (10 MG TOTAL) BY MOUTH DAILY. 90 tablet 1  . Coenzyme Q10 200 MG capsule Take 200 mg by mouth daily.    . fexofenadine-pseudoephedrine (ALLEGRA-D 24) 180-240 MG 24 hr tablet Take 1 tablet by mouth daily.    Marland Kitchen gabapentin (NEURONTIN) 300 MG capsule Take 1 capsule (300 mg total) by mouth at bedtime. 90 capsule 2  . Glucosamine HCl (GLUCOSAMINE PO) Take by mouth daily.    . hydrochlorothiazide (HYDRODIURIL) 25 MG tablet TAKE 1 TABLET (25 MG TOTAL) BY MOUTH DAILY. 90 tablet 1  . levothyroxine (SYNTHROID, LEVOTHROID) 112 MCG tablet TAKE 1 TABLET (112 MCG TOTAL) BY MOUTH DAILY BEFORE BREAKFAST. 90 tablet 1  . mupirocin ointment (BACTROBAN) 2 % Apply twice daily to bridge of the nose. 22 g 0  . Omega-3 Fatty Acids (FISH OIL) 1200 MG CAPS Take 2,400 mg by mouth daily.     . traMADol (ULTRAM) 50 MG tablet Take 2 tablets (100 mg total) by mouth every 6 (six) hours as needed for moderate pain or severe pain. 120 tablet 0  . vitamin B-12 (CYANOCOBALAMIN) 1000 MCG tablet Take 1,000 mcg by mouth daily.    . CRESTOR 10 MG tablet TAKE 1 TABLET (10 MG TOTAL) BY MOUTH DAILY. TAKE 1 TABLET ON SATURDAY AND 1/2 TAB DAILY (Patient taking differently: No sig reported) 30 tablet 6  . esomeprazole (NEXIUM) 40 MG capsule Take 40 mg by mouth daily at 12 noon.    . montelukast (SINGULAIR) 10 MG tablet TAKE 1 TABLET (10 MG TOTAL) BY MOUTH AT BEDTIME. 30 tablet 3   No facility-administered medications prior to visit.     Allergies  Allergen Reactions  . Lipitor  [Atorvastatin] Swelling  . Metronidazole Swelling  . Penicillins Swelling  . Pregabalin Other (See Comments)    REACTION: Somnolence and dizziness  . Simvastatin Other (See Comments)    Leg pain    Review of Systems  Constitutional: Negative for fever and malaise/fatigue.  HENT: Negative for congestion.   Eyes: Negative for blurred vision.  Respiratory: Positive  for shortness of breath. Negative for cough and wheezing.   Cardiovascular: Negative for palpitations, leg swelling and PND.  Gastrointestinal: Negative for abdominal pain, blood in stool and nausea.  Genitourinary: Negative for dysuria and frequency.  Musculoskeletal: Negative for falls.  Skin: Negative for rash.  Neurological: Negative for dizziness, loss of consciousness and headaches.  Endo/Heme/Allergies: Negative for environmental allergies.  Psychiatric/Behavioral: Negative for depression. The patient is not nervous/anxious.        Objective:    Physical Exam  BP 122/78 (BP Location: Left Arm, Patient Position: Sitting, Cuff Size: Normal)   Pulse 66   Temp 98.2 F (36.8 C) (Oral)   Wt 175 lb 9.6 oz (79.7 kg)   SpO2 97%   BMI 30.14 kg/m  Wt Readings from Last 3 Encounters:  04/04/16 175 lb (79.4 kg)  04/03/16 175 lb 9.6 oz (79.7 kg)  10/26/15 173 lb 4 oz (78.6 kg)     Lab Results  Component Value Date   WBC 11.1 (H) 04/03/2016   HGB 15.7 (H) 04/03/2016   HCT 45.2 04/03/2016   PLT 232.0 04/03/2016   GLUCOSE 113 (H) 04/03/2016   CHOL 193 04/03/2016   TRIG 246.0 (H) 04/03/2016   HDL 41.70 04/03/2016   LDLDIRECT 126.0 04/03/2016   LDLCALC 112 (H) 10/26/2015   ALT 25 04/03/2016   AST 26 04/03/2016   NA 137 04/03/2016   K 3.8 04/03/2016   CL 101 04/03/2016   CREATININE 0.85 04/03/2016   BUN 14 04/03/2016   CO2 31 04/03/2016   TSH 0.59 04/03/2016   INR 1.06 06/23/2013   HGBA1C 5.9 10/26/2015   MICROALBUR 0.2 04/27/2014    Lab Results  Component Value Date   TSH 0.59 04/03/2016   Lab  Results  Component Value Date   WBC 11.1 (H) 04/03/2016   HGB 15.7 (H) 04/03/2016   HCT 45.2 04/03/2016   MCV 88.1 04/03/2016   PLT 232.0 04/03/2016   Lab Results  Component Value Date   NA 137 04/03/2016   K 3.8 04/03/2016   CO2 31 04/03/2016   GLUCOSE 113 (H) 04/03/2016   BUN 14 04/03/2016   CREATININE 0.85 04/03/2016   BILITOT 0.7 04/03/2016   ALKPHOS 56 04/03/2016   AST 26 04/03/2016   ALT 25 04/03/2016   PROT 7.5 04/03/2016   ALBUMIN 4.2 04/03/2016   CALCIUM 9.7 04/03/2016   ANIONGAP 9 09/14/2014   GFR 69.88 04/03/2016   Lab Results  Component Value Date   CHOL 193 04/03/2016   Lab Results  Component Value Date   HDL 41.70 04/03/2016   Lab Results  Component Value Date   LDLCALC 112 (H) 10/26/2015   Lab Results  Component Value Date   TRIG 246.0 (H) 04/03/2016   Lab Results  Component Value Date   CHOLHDL 5 04/03/2016   Lab Results  Component Value Date   HGBA1C 5.9 10/26/2015       Assessment & Plan:   Problem List Items Addressed This Visit    Hyperlipidemia - Primary (Chronic)   Relevant Orders   Lipid panel (Completed)   Essential hypertension (Chronic)    Well controlled, no changes to meds. Encouraged heart healthy diet such as the DASH diet and exercise as tolerated.       Relevant Orders   TSH (Completed)   CBC (Completed)   ECHOCARDIOGRAM COMPLETE (Completed)   Comp Met (CMET) (Completed)   Sarcoidosis stage I (Chronic)    Has been referred to pulmonology due to  worsening SOB      Relevant Orders   Ambulatory referral to Pulmonology   Obesity (Chronic)    Encouraged DASH diet, decrease po intake and increase exercise as tolerated. Needs 7-8 hours of sleep nightly. Avoid trans fats, eat small, frequent meals every 4-5 hours with lean proteins, complex carbs and healthy fats. Minimize simple carbs      Hyperglycemia   Lumbar compression fracture (HCC)   Hypothyroidism    On Levothyroxine, continue to monitor       Exertional dyspnea    Worsening recently, improves with rest. Proceed with Echo and referred to pulmonology      Dermatitis    Other Visit Diagnoses    Encounter for immunization       Relevant Orders   Flu vaccine HIGH DOSE PF (Completed)      I have discontinued Ms. Plaisted's esomeprazole. I am also having her start on clotrimazole, omeprazole, and ranitidine. Additionally, I am having her maintain her Cinnamon, Fish Oil, Calcium Carbonate-Vit D-Min (CALCIUM 1200 PO), Vitamin D, Coenzyme Q10, aspirin EC, traMADol, BESIVANCE, mupirocin ointment, Glucosamine HCl (GLUCOSAMINE PO), fexofenadine-pseudoephedrine, vitamin B-12, gabapentin, montelukast, hydrochlorothiazide, atenolol, levothyroxine, and citalopram.  Meds ordered this encounter  Medications  . clotrimazole (CLOTRIMAZOLE AF) 1 % cream    Sig: Apply 1 application topically 2 (two) times daily.    Dispense:  113 g    Refill:  1  . omeprazole (PRILOSEC) 40 MG capsule    Sig: Take 1 capsule (40 mg total) by mouth daily.    Dispense:  30 capsule    Refill:  3  . ranitidine (ZANTAC) 150 MG tablet    Sig: Take 1-2 tablets (150-300 mg total) by mouth at bedtime as needed for heartburn.    Dispense:  60 tablet    Refill:  2     Penni Homans, MD

## 2016-04-14 NOTE — Assessment & Plan Note (Signed)
Has been referred to pulmonology due to worsening SOB

## 2016-04-14 NOTE — Assessment & Plan Note (Signed)
On Levothyroxine, continue to monitor 

## 2016-04-14 NOTE — Assessment & Plan Note (Signed)
Encouraged DASH diet, decrease po intake and increase exercise as tolerated. Needs 7-8 hours of sleep nightly. Avoid trans fats, eat small, frequent meals every 4-5 hours with lean proteins, complex carbs and healthy fats. Minimize simple carbs 

## 2016-04-20 ENCOUNTER — Emergency Department (HOSPITAL_BASED_OUTPATIENT_CLINIC_OR_DEPARTMENT_OTHER)
Admission: EM | Admit: 2016-04-20 | Discharge: 2016-04-20 | Disposition: A | Discharge status: Home / Self Care | Payer: Medicare Other | Attending: Emergency Medicine | Admitting: Emergency Medicine

## 2016-04-20 ENCOUNTER — Encounter (HOSPITAL_BASED_OUTPATIENT_CLINIC_OR_DEPARTMENT_OTHER): Payer: Self-pay | Admitting: Emergency Medicine

## 2016-04-20 ENCOUNTER — Emergency Department (HOSPITAL_BASED_OUTPATIENT_CLINIC_OR_DEPARTMENT_OTHER): Payer: Medicare Other

## 2016-04-20 DIAGNOSIS — I10 Essential (primary) hypertension: Secondary | ICD-10-CM | POA: Diagnosis not present

## 2016-04-20 DIAGNOSIS — Y999 Unspecified external cause status: Secondary | ICD-10-CM | POA: Insufficient documentation

## 2016-04-20 DIAGNOSIS — X58XXXA Exposure to other specified factors, initial encounter: Secondary | ICD-10-CM | POA: Insufficient documentation

## 2016-04-20 DIAGNOSIS — Z79899 Other long term (current) drug therapy: Secondary | ICD-10-CM | POA: Diagnosis not present

## 2016-04-20 DIAGNOSIS — M79642 Pain in left hand: Secondary | ICD-10-CM | POA: Diagnosis not present

## 2016-04-20 DIAGNOSIS — S60222A Contusion of left hand, initial encounter: Secondary | ICD-10-CM

## 2016-04-20 DIAGNOSIS — Y939 Activity, unspecified: Secondary | ICD-10-CM | POA: Insufficient documentation

## 2016-04-20 DIAGNOSIS — Y92009 Unspecified place in unspecified non-institutional (private) residence as the place of occurrence of the external cause: Secondary | ICD-10-CM | POA: Insufficient documentation

## 2016-04-20 DIAGNOSIS — Z7982 Long term (current) use of aspirin: Secondary | ICD-10-CM | POA: Diagnosis not present

## 2016-04-20 DIAGNOSIS — E039 Hypothyroidism, unspecified: Secondary | ICD-10-CM | POA: Diagnosis not present

## 2016-04-20 DIAGNOSIS — S6992XA Unspecified injury of left wrist, hand and finger(s), initial encounter: Secondary | ICD-10-CM | POA: Diagnosis present

## 2016-04-20 DIAGNOSIS — Z87891 Personal history of nicotine dependence: Secondary | ICD-10-CM | POA: Insufficient documentation

## 2016-04-20 DIAGNOSIS — M7989 Other specified soft tissue disorders: Secondary | ICD-10-CM | POA: Diagnosis not present

## 2016-04-20 MED ORDER — IBUPROFEN 400 MG PO TABS
400.0000 mg | ORAL_TABLET | Freq: Once | ORAL | Status: AC
Start: 2016-04-20 — End: 2016-04-20
  Administered 2016-04-20: 400 mg via ORAL
  Filled 2016-04-20: qty 1

## 2016-04-20 MED ORDER — SULFAMETHOXAZOLE-TRIMETHOPRIM 800-160 MG PO TABS: 1.0000 | ORAL_TABLET | Freq: Two times a day (BID) | ORAL | 0 refills | Status: AC

## 2016-04-20 MED ORDER — CEPHALEXIN 500 MG PO CAPS: 500.0000 mg | ORAL_CAPSULE | Freq: Four times a day (QID) | ORAL | 0 refills | Status: DC

## 2016-04-20 MED ORDER — OXYCODONE HCL 5 MG PO TABS
2.5000 mg | ORAL_TABLET | Freq: Once | ORAL | Status: AC
  Administered 2016-04-20: 2.5 mg via ORAL
  Filled 2016-04-20: qty 1

## 2016-04-20 MED ORDER — ACETAMINOPHEN 500 MG PO TABS
1000.0000 mg | ORAL_TABLET | Freq: Once | ORAL | Status: AC
Start: 2016-04-20 — End: 2016-04-20
  Administered 2016-04-20: 1000 mg via ORAL
  Filled 2016-04-20: qty 2

## 2016-04-20 NOTE — Discharge Instructions (Signed)
Return for spreading redness swelling, fever calling her family doctor tomorrow or the next day.  Take 2 over the counter ibuprofen tablets 3 times a day or 1 over-the-counter naproxen tablets twice a day for pain. Also take tylenol 1068m(2 extra strength) four times a day.

## 2016-04-20 NOTE — ED Triage Notes (Addendum)
Patient reports injury to left hand after she was cleaning around her house on Friday.  States the only thing she remembered doing was moving coat hangers from one room to another.  Swelling and erythema noted to left hand.  Reports pain with movement of fingers

## 2016-04-20 NOTE — ED Notes (Signed)
MD at bedside. 

## 2016-04-20 NOTE — ED Provider Notes (Signed)
[facility] DEPT [facility] Provider Note   CSN: [id number] Arrival date & time: 04/20/16  1246 By signing my name below, I, Georgette Shell, attest that this documentation has been prepared under the direction and in the presence of Deno Etienne, DO. Electronically Signed: Georgette Shell, ED Scribe. 04/20/16. 3:43 PM.  History   Chief Complaint Chief Complaint  Patient presents with  . Hand Injury   HPI Comments: Kimberly Irwin is a 70 y.o. female who presents to the Emergency Department complaining of sudden onset, 8/10 left hand pain with swelling s/p mechanical injury two days ago. Pt reports that she was cleaning around her house and she only remembers moving coat hangers from one room to other. Pt states pain is exacerbated with movement of the fingers and palpation. No alleviating factors noted. Pt denies numbness, paresthesia, fever, or any other associated symptoms.   The history is provided by the patient. No language interpreter was used.  Hand Injury   The incident occurred 2 days ago. The incident occurred at home. There was no injury mechanism. The pain is present in the left fingers, left hand and left wrist. The quality of the pain is described as throbbing. The pain is at a severity of 8/10. The pain is moderate. The pain has been constant since the incident. Pertinent negatives include no fever and no malaise/fatigue. She reports no foreign bodies present. The symptoms are aggravated by movement, palpation and use. She has tried nothing for the symptoms.    Past Medical History:  Diagnosis Date  . Acute hip pain 10/26/2012  . Bursitis of left hip 10/26/2012  . Chronic low back pain   . DDD (degenerative disc disease)    L3-4 with facet arthropathy and stenosis  . Degenerative spondylolisthesis    L4-5 grade 1 with stenosis  . GERD (gastroesophageal reflux disease)   . Heart murmur    as an infant  . History of hiatal hernia   . Hyperlipidemia   . Hypertension   . Hypothyroidism   .  Hypothyroidism 10/28/2015  . Pain in joint, lower leg 10/26/2012   left   . Pneumonia    as a child several times.   . Sarcoidosis ([place])   . SOB (shortness of breath) 04/03/2016  . Tremors of nervous system     Patient Active Problem List   Diagnosis Date Noted  . Dermatitis 04/14/2016  . Exertional dyspnea 04/03/2016  . Hypothyroidism 10/28/2015  . Skin infection 12/28/2014  . Lumbar compression fracture ([place]) 09/14/2014  . Medicare annual wellness visit, subsequent 04/27/2014  . Obesity   . S/P left THA, AA 06/28/2013  . Decreased visual acuity 02/19/2013  . Hyperglycemia 04/04/2011  . Chronic pain syndrome 01/20/2011  . Depression 08/10/2008  . GERD 04/26/2008  . Lumbar disc disease 05/31/2007  . 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 Hyperlipidemia 05/29/2007  . Tremor   . Essential hypertension     Past Surgical History:  Procedure Laterality Date  . ANTERIOR LAT LUMBAR FUSION Left 08/29/2014   Procedure: EXTREME LEFT LATERAL INTERBODY FUSION LUMBAR TWO-THREE LATERAL PLATE;  Surgeon: Charlie Pitter, MD;  Location: [facility];  Service: Neurosurgery;  Laterality: Left;  . BACK SURGERY  01/15/09   L3-4 and L4-5 decompressive laminectomy with bilateral L3, L4, and L5 decompressive foraminotomies, more than it would be required for simple interbody fusion alone.  Marland Kitchen BACK SURGERY  01/15/09   L3-4 and L4-5 posterior lumbar interbody fusion utilizing tanget interbody allograft wedge, Telamon  interbody PEEk cage, and local autografting.  Marland Kitchen BACK SURGERY  01/15/09   L3, L4, and L5 posterolateral arthrodesis using segmental pedicle screw fixation and local autografting.  Marland Kitchen CARDIAC CATHETERIZATION    . CARPAL TUNNEL RELEASE Bilateral   . COLONOSCOPY    . ESOPHAGOGASTRODUODENOSCOPY    . EYE SURGERY     lens implant  . JOINT REPLACEMENT  09/13/09   Right Hip, Dr. Alvan Dame  . KYPHOPLASTY N/A 09/14/2014   Procedure: Lumbar Two Kyphoplasty/Vertebroplasty;  Surgeon: Charlie Pitter, MD;  Location: [facility]UR[facility] ORS;  Service: Neurosurgery;  Laterality: N/A;  Lumbar Two Kyphoplasty/Vertebroplasty  . THYROIDECTOMY    . TOTAL HIP ARTHROPLASTY Left 06/28/2013   Procedure: LEFT TOTAL  HIP ARTHROPLASTY ANTERIOR APPROACH;  Surgeon: Mauri Pole, MD;  Location: [facility];  Service: Orthopedics;  Laterality: Left;    OB History    No data available       Home Medications    Prior to Admission medications   Medication Sig Start Date End Date Taking? Authorizing Provider  aspirin EC 81 MG tablet Take 81 mg by mouth daily.    Historical Provider, MD  atenolol (TENORMIN) 50 MG tablet TAKE 1 TABLET (50 MG TOTAL) BY MOUTH DAILY. 02/29/16   Mosie Lukes, MD  BESIVANCE 0.6 % SUSP Reported on 07/23/2015 12/18/14   Historical Provider, MD  Calcium Carbonate-Vit D-Min (CALCIUM 1200 PO) Take 1,200 mg by mouth daily.     Historical Provider, MD  cephALEXin (KEFLEX) 500 MG capsule Take 1 capsule (500 mg total) by mouth 4 (four) times daily. 04/20/16   Deno Etienne, DO  Cholecalciferol (VITAMIN D) 2000 UNITS tablet Take 2,000 Units by mouth daily.    Historical Provider, MD  Cinnamon 500 MG capsule Take 2,000 mg by mouth daily.      Historical Provider, MD  citalopram (CELEXA) 10 MG tablet TAKE 1 TABLET (10 MG TOTAL) BY MOUTH DAILY. 03/30/16   Mosie Lukes, MD  clotrimazole (CLOTRIMAZOLE AF) 1 % cream Apply 1 application topically 2 (two) times daily. 04/03/16   Mosie Lukes, MD  Coenzyme Q10 200 MG capsule Take 200 mg by mouth daily.    Historical Provider, MD  fexofenadine-pseudoephedrine (ALLEGRA-D 24) 180-240 MG 24 hr tablet Take 1 tablet by mouth daily.    Historical Provider, MD  gabapentin (NEURONTIN) 300 MG capsule Take 1 capsule (300 mg total) by mouth at bedtime. 10/26/15   Mosie Lukes, MD  Glucosamine HCl (GLUCOSAMINE PO) Take by mouth daily.    Historical Provider, MD  hydrochlorothiazide (HYDRODIURIL) 25 MG tablet TAKE 1 TABLET (25 MG TOTAL) BY MOUTH DAILY. 02/29/16   Mosie Lukes, MD    levothyroxine (SYNTHROID, LEVOTHROID) 112 MCG tablet TAKE 1 TABLET (112 MCG TOTAL) BY MOUTH DAILY BEFORE BREAKFAST. 02/29/16   Mosie Lukes, MD  montelukast (SINGULAIR) 10 MG tablet TAKE 1 TABLET (10 MG TOTAL) BY MOUTH AT BEDTIME. 11/12/15   Mosie Lukes, MD  mupirocin ointment (BACTROBAN) 2 % Apply twice daily to bridge of the nose. 01/04/15   Mackie Pai, PA-C  Omega-3 Fatty Acids (FISH OIL) 1200 MG CAPS Take 2,400 mg by mouth daily.     Historical Provider, MD  omeprazole (PRILOSEC) 40 MG capsule Take 1 capsule (40 mg total) by mouth daily. 04/03/16   Mosie Lukes, MD  ranitidine (ZANTAC) 150 MG tablet Take 1-2 tablets (150-300 mg total) by mouth at bedtime as needed for heartburn. 04/03/16   Mosie Lukes,  MD  rosuvastatin (CRESTOR) 10 MG tablet Take 1 tablet (10 mg total) by mouth daily. 04/04/16   Mosie Lukes, MD  sulfamethoxazole-trimethoprim (BACTRIM DS,SEPTRA DS) 800-160 MG tablet Take 1 tablet by mouth 2 (two) times daily. 04/20/16 04/27/16  Deno Etienne, DO  traMADol (ULTRAM) 50 MG tablet Take 2 tablets (100 mg total) by mouth every 6 (six) hours as needed for moderate pain or severe pain. 12/04/14   Mosie Lukes, MD  vitamin B-12 (CYANOCOBALAMIN) 1000 MCG tablet Take 1,000 mcg by mouth daily.    Historical Provider, MD    Family History Family History  Problem Relation Age of Onset  . Cancer Mother 85    Breast  . Heart disease Mother   . Hypertension Mother   . Hyperlipidemia Mother   . Heart attack Mother   . Asthma Maternal Grandmother     Social History Social History  Substance Use Topics  . Smoking status: Former Smoker    Packs/day: 0.50    Years: 32.00    Types: Cigarettes    Quit date: 07/22/1994  . Smokeless tobacco: Never Used  . Alcohol use No     Allergies   Lipitor [atorvastatin]; Metronidazole; Penicillins; Pregabalin; and Simvastatin   Review of Systems Review of Systems  Constitutional: Negative for chills, fever and malaise/fatigue.  HENT:  Negative for congestion and rhinorrhea.   Eyes: Negative for redness and visual disturbance.  Respiratory: Negative for shortness of breath and wheezing.   Cardiovascular: Negative for chest pain and palpitations.  Gastrointestinal: Negative for nausea and vomiting.  Genitourinary: Negative for dysuria and urgency.  Musculoskeletal: Positive for arthralgias. Negative for myalgias.  Skin: Negative for pallor and wound.  Neurological: Negative for dizziness, numbness and headaches.  All other systems reviewed and are negative.    Physical Exam Updated Vital Signs BP 130/74 (BP Location: Right Arm)   Pulse 65   Temp 97.8 F (36.6 C) (Oral)   Resp 20   Ht 5' 4" (1.626 m)   Wt 175 lb (79.4 kg)   SpO2 95%   BMI 30.04 kg/m   Physical Exam  Constitutional: She is oriented to person, place, and time. She appears well-developed and well-nourished. No distress.  HENT:  Head: Normocephalic and atraumatic.  Eyes: EOM are normal. Pupils are equal, round, and reactive to light.  Neck: Normal range of motion. Neck supple.  Cardiovascular: Normal rate and regular rhythm.  Exam reveals no gallop and no friction rub.   No murmur heard. Pulmonary/Chest: Effort normal. She has no wheezes. She has no rales.  Abdominal: Soft. She exhibits no distension. There is no tenderness.  Musculoskeletal: She exhibits edema and tenderness.  Erythema and warmth about the MTP of the second and third digit of left hand. Swelling extends to left wrist. No pain with dorsiflexion of wrist. Limited mobility of hand secondary to pain.  Neurological: She is alert and oriented to person, place, and time.  Skin: Skin is warm and dry. She is not diaphoretic.  Psychiatric: She has a normal mood and affect. Her behavior is normal.  Nursing note and vitals reviewed.  ED Treatments / Results  DIAGNOSTIC STUDIES: Oxygen Saturation is 95% on RA, poor by my interpretation.    COORDINATION OF CARE: 3:41 PM Discussed  treatment plan with pt at bedside which includes Ibuprofen and Tylenol and pt agreed to plan.  Labs (all labs ordered are listed, but only abnormal results are displayed) Labs Reviewed - No data to display  EKG  EKG Interpretation None       Radiology Dg Hand Complete Left  Result Date: 04/20/2016 CLINICAL DATA:  LEFT hand swelling and pain after injury on Friday, limited range of motion in fingers EXAM: LEFT HAND - COMPLETE 3+ VIEW COMPARISON:  None FINDINGS: Osseous demineralization. Degenerative changes at STT joint and first CMC joint. Scattered degenerative changes of interphalangeal joints. Joint space narrowing and subluxation at second and third MCP joints. No acute fracture, dislocation, or bone destruction. Dorsal soft tissue swelling LEFT hand. IMPRESSION: Osseous demineralization with scattered degenerative changes as above. No acute abnormalities. Electronically Signed   By: Lavonia Dana M.D.   On: 04/20/2016 13:17    Procedures Procedures (including critical care time)  Medications Ordered in ED Medications  acetaminophen (TYLENOL) tablet 1,000 mg (not administered)  ibuprofen (ADVIL,MOTRIN) tablet 400 mg (not administered)  oxyCODONE (Oxy IR/ROXICODONE) immediate release tablet 2.5 mg (not administered)     Initial Impression / Assessment and Plan / ED Course  I have reviewed the triage vital signs and the nursing notes.  Pertinent labs & imaging results that were available during my care of the patient were reviewed by me and considered in my medical decision making (see chart for details).  Clinical Course    70 yo F With a chief complaints of a hand injury. Patient has been moving things for the past few days. Having some pain and swelling to the dorsal aspect of the left hand. Some pain limiting range of motion. Patient able to move the wrist without significant tenderness I doubt that she has septic arthritis. There is some warmth in the area then no break in  the skin. I will cover with antibiotics in case this is cellulitis.  3:54 PM:  I have discussed the diagnosis/risks/treatment options with the patient and believe the pt to be eligible for discharge home to follow-up with PCP. We also discussed returning to the ED immediately if new or worsening sx occur. We discussed the sx which are most concerning (e.g., sudden worsening pain, fever, inability to tolerate by mouth) that necessitate immediate return. Medications administered to the patient during their visit and any new prescriptions provided to the patient are listed below.  Medications given during this visit Medications  acetaminophen (TYLENOL) tablet 1,000 mg (not administered)  ibuprofen (ADVIL,MOTRIN) tablet 400 mg (not administered)  oxyCODONE (Oxy IR/ROXICODONE) immediate release tablet 2.5 mg (not administered)     The patient appears reasonably screen and/or stabilized for discharge and I doubt any other medical condition or other [facility] requiring further screening, evaluation, or treatment in the ED at this time prior to discharge.    Final Clinical Impressions(s) / ED Diagnoses   Final diagnoses:  Hand contusion, left, initial encounter    New Prescriptions New Prescriptions   CEPHALEXIN (KEFLEX) 500 MG CAPSULE    Take 1 capsule (500 mg total) by mouth 4 (four) times daily.   SULFAMETHOXAZOLE-TRIMETHOPRIM (BACTRIM DS,SEPTRA DS) 800-160 MG TABLET    Take 1 tablet by mouth 2 (two) times daily.    I personally performed the services described in this documentation, which was scribed in my presence. The recorded information has been reviewed and is accurate.     Deno Etienne, DO 04/20/16 1554

## 2016-04-20 NOTE — ED Notes (Signed)
Pt states she was carrying clothes on her left wrist and noticed it started hurting afterward. Moves slightly. Feels touch. Cap refill < 3 sec.

## 2016-05-16 ENCOUNTER — Encounter: Payer: Self-pay | Admitting: Adult Health

## 2016-05-16 ENCOUNTER — Ambulatory Visit (INDEPENDENT_AMBULATORY_CARE_PROVIDER_SITE_OTHER): Payer: Medicare Other | Admitting: Adult Health

## 2016-05-16 ENCOUNTER — Other Ambulatory Visit (INDEPENDENT_AMBULATORY_CARE_PROVIDER_SITE_OTHER): Payer: Medicare Other

## 2016-05-16 ENCOUNTER — Encounter (INDEPENDENT_AMBULATORY_CARE_PROVIDER_SITE_OTHER): Payer: Medicare Other | Admitting: Pulmonary Disease

## 2016-05-16 VITALS — BP 90/60 | HR 65 | Ht 64.0 in | Wt 174.0 lb

## 2016-05-16 DIAGNOSIS — D869 Sarcoidosis, unspecified: Secondary | ICD-10-CM

## 2016-05-16 DIAGNOSIS — J9611 Chronic respiratory failure with hypoxia: Secondary | ICD-10-CM

## 2016-05-16 LAB — PULMONARY FUNCTION TEST
DL/VA % PRED: 43 %
DL/VA: 2.07 ml/min/mmHg/L
DLCO COR % PRED: 34 %
DLCO cor: 8.34 ml/min/mmHg
DLCO unc % pred: 35 %
DLCO unc: 8.63 ml/min/mmHg
FEF 25-75 POST: 1.48 L/s
FEF 25-75 Pre: 0.91 L/sec
FEF2575-%Change-Post: 62 %
FEF2575-%PRED-POST: 82 %
FEF2575-%Pred-Pre: 50 %
FEV1-%CHANGE-POST: 10 %
FEV1-%Pred-Post: 80 %
FEV1-%Pred-Pre: 72 %
FEV1-PRE: 1.59 L
FEV1-Post: 1.76 L
FEV1FVC-%Change-Post: 8 %
FEV1FVC-%PRED-PRE: 93 %
FEV6-%Change-Post: 2 %
FEV6-%PRED-POST: 83 %
FEV6-%Pred-Pre: 81 %
FEV6-POST: 2.32 L
FEV6-PRE: 2.26 L
FEV6FVC-%CHANGE-POST: 0 %
FEV6FVC-%PRED-PRE: 103 %
FEV6FVC-%Pred-Post: 103 %
FVC-%CHANGE-POST: 2 %
FVC-%PRED-POST: 79 %
FVC-%PRED-PRE: 77 %
FVC-POST: 2.33 L
FVC-PRE: 2.28 L
POST FEV1/FVC RATIO: 76 %
PRE FEV6/FVC RATIO: 99 %
Post FEV6/FVC ratio: 100 %
Pre FEV1/FVC ratio: 70 %
RV % PRED: 57 %
RV: 1.28 L
TLC % pred: 74 %
TLC: 3.77 L

## 2016-05-16 LAB — BASIC METABOLIC PANEL
BUN: 15 mg/dL (ref 6–23)
CHLORIDE: 100 meq/L (ref 96–112)
CO2: 31 meq/L (ref 19–32)
CREATININE: 0.94 mg/dL (ref 0.40–1.20)
Calcium: 10.2 mg/dL (ref 8.4–10.5)
GFR: 62.2 mL/min (ref >60.00)
GLUCOSE: 124 mg/dL — AB (ref 70–99)
POTASSIUM: 4.9 meq/L (ref 3.5–5.1)
Sodium: 140 mEq/L (ref 135–145)

## 2016-05-16 LAB — NITRIC OXIDE: Nitric Oxide: 20

## 2016-05-16 NOTE — Progress Notes (Signed)
Subjective:    Patient ID: Kimberly Irwin, female    DOB: October 29, 1945, 70 y.o.   MRN: [id number]  HPI 70 year old female former smoker  followed for sarcoidosis seen for pulmonary reevaluation 04/04/2016 for increased dyspnea on exertion Previously seen by Dr. Joya Gaskins, as office visit 2014  TEST  ~1997 mediastinoscopy allegedly had granulomas c/w granulomas. -reported  Pulmonary functions 10/18/2012: FEV1 84% FVC 83% total lung capacity 86% diffusion capacity 56%  05/16/2016 Follow up : Sarcoid  Patient returns for a 6 week follow-up. Patient was seen last visit for reevaluation for increased dyspnea on exertion. Patient has been followed in the past for sarcoidosis. Last seen by Dr. Joya Gaskins in 2014. Patient was seen 04/04/2016 and set up for a pulmonary function test that appeared stable with an FEV1 80%, ratio 76, 10% bronchodilator change, FVC 79%, positive mid flow reversibility, DLCO 35%. FeNO is 20 today . Chest x-ray 04/09/2016 was clear.  O2 saturation at rest is 100%. Walk test in the office shows a desaturation on room air at 87%. She was placed on oxygen with O2 saturation at 2 L at 91%. She has minimum cough. Says that she wears out with walking. She denies any hemoptysis, rash, abdominal pain, nausea, vomiting, diarrhea. 2-D echo on 04/09/2016 showed an EF of 83-15%, grade 1 diastolic dysfunction. Pulmonary artery pressure 44 mmHg.   Past Medical History:  Diagnosis Date  . Acute hip pain 10/26/2012  . Bursitis of left hip 10/26/2012  . Chronic low back pain   . DDD (degenerative disc disease)    L3-4 with facet arthropathy and stenosis  . Degenerative spondylolisthesis    L4-5 grade 1 with stenosis  . GERD (gastroesophageal reflux disease)   . Heart murmur    as an infant  . History of hiatal hernia   . Hyperlipidemia   . Hypertension   . Hypothyroidism   . Hypothyroidism 10/28/2015  . Pain in joint, lower leg 10/26/2012   left   . Pneumonia    as a child several  times.   . Sarcoidosis ([place])   . SOB (shortness of breath) 04/03/2016  . Tremors of nervous system      Review of Systems Constitutional:   No  weight loss, night sweats,  Fevers, chills, + fatigue, or  lassitude.  HEENT:   No headaches,  Difficulty swallowing,  Tooth/dental problems, or  Sore throat,                No sneezing, itching, ear ache, nasal congestion, post nasal drip,   CV:  No chest pain,  Orthopnea, PND, swelling in lower extremities, anasarca, dizziness, palpitations, syncope.   GI  No heartburn, indigestion, abdominal pain, nausea, vomiting, diarrhea, change in bowel habits, loss of appetite, bloody stools.   Resp:  No chest wall deformity  Skin: no rash or lesions.  GU: no dysuria, change in color of urine, no urgency or frequency.  No flank pain, no hematuria   MS:  No joint pain or swelling.  No decreased range of motion.  No back pain.  Psych:  No change in mood or affect. No depression or anxiety.  No memory loss.         Objective:   Physical Exam Vitals:   05/16/16 1557  BP: 90/60  Pulse: 65  SpO2: 93%  Weight: 174 lb (78.9 kg)  Height: _0  (1.626 m)  Body mass index is 29.87 kg/m.   GEN: A/Ox3; pleasant , NAD, overweight  HEENT:  [place]/AT,  EACs-clear, TMs-wnl, NOSE-clear, THROAT-clear, no lesions, no postnasal drip or exudate noted.   NECK:  Supple w/ fair ROM; no JVD; normal carotid impulses w/o bruits; no thyromegaly or nodules palpated; no lymphadenopathy.    RESP  Clear  P & A; w/o, wheezes/ rales/ or rhonchi. no accessory muscle use, no dullness to percussion  CARD:  RRR, no m/r/g  , tr  peripheral edema, pulses intact, no cyanosis or clubbing.  GI:   Soft & nt; nml bowel sounds; no organomegaly or masses detected.   Musco: Warm bil, no deformities or joint swelling noted.   Neuro: alert, no focal deficits noted.    Skin: Warm, no lesions or rashes  Robertt Buda NP-C  [facility]  05/16/16         Assessment & Plan:

## 2016-05-16 NOTE — Patient Instructions (Addendum)
Set up for CT chest with contrast.  Begin Oxygen 2l/m with walking  Follow up Dr. Corrie Dandy in 3 weeks and As needed   Please contact office for sooner follow up if symptoms do not improve or worsen or seek emergency care

## 2016-05-20 ENCOUNTER — Telehealth: Payer: Self-pay | Admitting: Pulmonary Disease

## 2016-05-20 ENCOUNTER — Encounter (INDEPENDENT_AMBULATORY_CARE_PROVIDER_SITE_OTHER): Payer: Medicare Other | Admitting: Ophthalmology

## 2016-05-20 DIAGNOSIS — H43813 Vitreous degeneration, bilateral: Secondary | ICD-10-CM | POA: Diagnosis not present

## 2016-05-20 DIAGNOSIS — H35033 Hypertensive retinopathy, bilateral: Secondary | ICD-10-CM

## 2016-05-20 DIAGNOSIS — R0609 Other forms of dyspnea: Secondary | ICD-10-CM

## 2016-05-20 DIAGNOSIS — I10 Essential (primary) hypertension: Secondary | ICD-10-CM

## 2016-05-20 DIAGNOSIS — H34832 Tributary (branch) retinal vein occlusion, left eye, with macular edema: Secondary | ICD-10-CM | POA: Diagnosis not present

## 2016-05-20 DIAGNOSIS — J9611 Chronic respiratory failure with hypoxia: Secondary | ICD-10-CM | POA: Insufficient documentation

## 2016-05-20 DIAGNOSIS — D869 Sarcoidosis, unspecified: Secondary | ICD-10-CM

## 2016-05-20 DIAGNOSIS — R0600 Dyspnea, unspecified: Secondary | ICD-10-CM

## 2016-05-20 NOTE — Telephone Encounter (Signed)
lmtcb x1 for pt.

## 2016-05-20 NOTE — Assessment & Plan Note (Signed)
PFT with minimal drop since 2014.  New onset exertional desat with mild PAH on echo  Check CT chest w/ contrast .  Begin O2 .    Plan  Patient Instructions  Set up for CT chest with contrast.  Begin Oxygen 2l/m with walking  Follow up Dr. Corrie Dandy in 3 weeks and As needed   Please contact office for sooner follow up if symptoms do not improve or worsen or seek emergency care

## 2016-05-20 NOTE — Assessment & Plan Note (Signed)
Ambulatory desats - will begin O2 2l/m with walking  Check CT chest w/ contrast. -  Plan  Patient Instructions  Set up for CT chest with contrast.  Begin Oxygen 2l/m with walking  Follow up Dr. Corrie Dandy in 3 weeks and As needed   Please contact office for sooner follow up if symptoms do not improve or worsen or seek emergency care

## 2016-05-21 ENCOUNTER — Inpatient Hospital Stay: Admission: RE | Admit: 2016-05-21 | Payer: Medicare Other | Source: Ambulatory Visit

## 2016-05-21 NOTE — Telephone Encounter (Signed)
Spoke with pt. States that [facility] brought her oxygen tanks that are to heavy to carry. Reports that the tank they brought her weighs 10lbs. She can't carry something that heavy. The delivery driver from [facility] told her that this type of tank was the smallest they offered. I have left a message with Melissa with [facility] to return our call.

## 2016-05-22 NOTE — Telephone Encounter (Signed)
Called and spoke with [facility], per [facility], pt has a [employer] portable oxygen concentrator and backup eTanks.    Spoke with patient, states that it is not the etanks that are the problem, it's that her POC is too large and she is unable to pull or lift her POC.    Called back to [facility], states that her only other option for a smaller POC would require her to be evaluated for pulse dosing.    AD please advise if ok to order.  Thanks!

## 2016-05-22 NOTE — Telephone Encounter (Signed)
   OK to assess/evaluate pt for a smaller POC. Thanks.  Kimberly Irwin

## 2016-05-22 NOTE — Telephone Encounter (Signed)
Pt aware.  Order placed for POC eval with [facility]. Nothing further needed.

## 2016-05-23 ENCOUNTER — Ambulatory Visit (INDEPENDENT_AMBULATORY_CARE_PROVIDER_SITE_OTHER)
Admission: RE | Admit: 2016-05-23 | Discharge: 2016-05-23 | Disposition: A | Discharge status: Home / Self Care | Payer: Medicare Other | Source: Ambulatory Visit | Attending: Adult Health | Admitting: Adult Health

## 2016-05-23 DIAGNOSIS — Z803 Family history of malignant neoplasm of breast: Secondary | ICD-10-CM | POA: Diagnosis not present

## 2016-05-23 DIAGNOSIS — D869 Sarcoidosis, unspecified: Secondary | ICD-10-CM

## 2016-05-23 DIAGNOSIS — Z1231 Encounter for screening mammogram for malignant neoplasm of breast: Secondary | ICD-10-CM | POA: Diagnosis not present

## 2016-05-23 LAB — HM MAMMOGRAPHY

## 2016-05-23 MED ORDER — IOPAMIDOL (ISOVUE-370) INJECTION 76%
80.0000 mL | Freq: Once | INTRAVENOUS | Status: AC | PRN
  Administered 2016-05-23: 80 mL via INTRAVENOUS

## 2016-05-23 NOTE — Progress Notes (Signed)
Called spoke with pt. Reviewed results and recs. Pt voiced understanding and had no further questions.

## 2016-05-28 ENCOUNTER — Telehealth: Payer: Self-pay | Admitting: Adult Health

## 2016-05-28 NOTE — Telephone Encounter (Signed)
Order was placed on 05/22/16 for POC eval.  Pt states that she has not heard anything from [facility] about getting this set up.  Aware that I will call and see what is going on with the order and call her back.  Called [facility], spoke with Corene Cornea, states that the RT dept is a little backed up and will try and contact the patient scheduled/set up today. Pt aware to call back if she still hasnt heard anything by Friday 05/30/16.  Nothing further needed.

## 2016-05-29 ENCOUNTER — Other Ambulatory Visit: Payer: Self-pay | Admitting: Family Medicine

## 2016-05-29 MED ORDER — OMEPRAZOLE 40 MG PO CPDR: 40.0000 mg | DELAYED_RELEASE_CAPSULE | Freq: Every day | ORAL | 1 refills | Status: DC

## 2016-05-30 ENCOUNTER — Encounter: Payer: Self-pay | Admitting: Family Medicine

## 2016-06-04 ENCOUNTER — Other Ambulatory Visit: Payer: Self-pay | Admitting: *Deleted

## 2016-06-04 DIAGNOSIS — J9611 Chronic respiratory failure with hypoxia: Secondary | ICD-10-CM

## 2016-06-04 MED ORDER — BUDESONIDE-FORMOTEROL FUMARATE 160-4.5 MCG/ACT IN AERO: 2.0000 | INHALATION_SPRAY | Freq: Two times a day (BID) | RESPIRATORY_TRACT | 5 refills | Status: DC

## 2016-06-20 ENCOUNTER — Ambulatory Visit: Payer: Medicare Other | Admitting: Pulmonary Disease

## 2016-07-01 ENCOUNTER — Ambulatory Visit (INDEPENDENT_AMBULATORY_CARE_PROVIDER_SITE_OTHER): Payer: Medicare Other | Admitting: Pulmonary Disease

## 2016-07-01 ENCOUNTER — Encounter: Payer: Self-pay | Admitting: Pulmonary Disease

## 2016-07-01 VITALS — BP 142/86 | HR 57 | Ht 64.0 in | Wt 175.0 lb

## 2016-07-01 DIAGNOSIS — R0609 Other forms of dyspnea: Secondary | ICD-10-CM | POA: Diagnosis not present

## 2016-07-01 DIAGNOSIS — J439 Emphysema, unspecified: Secondary | ICD-10-CM | POA: Diagnosis not present

## 2016-07-01 DIAGNOSIS — J449 Chronic obstructive pulmonary disease, unspecified: Secondary | ICD-10-CM

## 2016-07-01 DIAGNOSIS — J9611 Chronic respiratory failure with hypoxia: Secondary | ICD-10-CM | POA: Diagnosis not present

## 2016-07-01 DIAGNOSIS — E669 Obesity, unspecified: Secondary | ICD-10-CM

## 2016-07-01 DIAGNOSIS — Z6830 Body mass index (BMI) 30.0-30.9, adult: Secondary | ICD-10-CM

## 2016-07-01 DIAGNOSIS — R0600 Dyspnea, unspecified: Secondary | ICD-10-CM

## 2016-07-01 DIAGNOSIS — G4710 Hypersomnia, unspecified: Secondary | ICD-10-CM | POA: Insufficient documentation

## 2016-07-01 MED ORDER — ALBUTEROL SULFATE HFA 108 (90 BASE) MCG/ACT IN AERS: 2.0000 | INHALATION_SPRAY | Freq: Four times a day (QID) | RESPIRATORY_TRACT | 6 refills | Status: DC | PRN

## 2016-07-01 NOTE — Assessment & Plan Note (Signed)
Patient with  exertional dyspnea with doing more than ADLs x 1 year.  Saw cardiologist at the start of the year and was clerared for back surgery.   Pulmonary consult as pt with h/o Sarcoidosis.   Patient is  known to have stage I sarcoidosis which has been stable. Last seen by Dr.Wright in 2014 and PFT and CXR were N.   Recent PFT (05/16/16)  FEV1  1.59  72%, DLCO 35%. Chect ct scan (05/23/16) :Cardiomegaly. No pericardial effusion. Coronary artery calcifications, particularly dense within the left anterior descending coronary artery. Recommend correlation with any possible associated cardiac symptoms. Mild emphysematous change at the lung apices.  I think her exertional dyspnea is multifactorial. It is related to mild COPD which is currently being treated with Symbicort and oxygen. No evidence for sarcoidosis base of the chest CT scan. Chest CT scan shows dense calcification in the LAD which makes me wonder whether she has active cardiac issues causing exertional dyspnea. Certainly, patient can also have sleep apnea which can make things worse for her. Patient has obesity and has not is essentially gained significant weight last year. She is more symptomatic the last year.  Plan : 1. Continue to manage COPD. Continue Symbicort 160/4.5, 2 puffs twice a day as well as albuterol when necessary. We'll consider Spiriva on follow-up. 2. Pt is to see her PCP this week.  Suggest to re-consult Cards to evaluate her for CAD causing SOB.  Pt was also advised to follow up with his cardiologist.  3. Will get ONO.  If abn, will need a PSG. Strong family history of sleep apnea. She does not necessarily look forward to using CPAP but we can try especially if she has moderate to severe OSA. 4. Cont o2 2L with exertion.  5. Told pt to call us if with worsening SOB before f/u.

## 2016-07-01 NOTE — Assessment & Plan Note (Signed)
PFT (05/2016) FEV1  1.59  72%,  DLCO 35% Cont symbicort 160/4.5 2P BID, alb prn. May need Spiriva if not better. Cont o2 2l with exertion. Plan for ONO.  Needs alpha one on f/u as well as ABG.  Got flu shot. Needs prevnar 13 > will ask her PCP.  Got PNA 23.

## 2016-07-01 NOTE — Progress Notes (Signed)
Subjective:    Patient ID: Kimberly Irwin, female    DOB: [birth year], 70 y.o.   MRN: [id number]  HPI   This is the case of Kimberly Irwin, 70 y.o. Female, who was referred by Dr. Penni Homans in consultation regarding Sarcoidosis and dyspnea.   As you very well know, patient was diagnosed with sarcoidosis 20 yrs ago. She has a 15 PY smoking history, quit in her 87s.  Has not been dxed with asthma or COPD. Pt had mediastinoscopy roughly 20 yrs ago and allegedly had granulomas c/w granulomas.   She was seeing Dr. Joya Gaskins and the last time she saw him was in 2014. 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 Note current chest x-ray was negative for disease activity. Note pulmonary functions were normal including normal diffusion capacity and lung volumes. Note ACE level was normal.  Patient was deemed not a candidate for systemic treatment as she was asymptomatic.  Through the years, patient has remained stable from her lung point of view. She currently sees a cardiologist who according to the patient, has cleared her for several of her back surgeries. The last back surgery she had was January, 2017.  Patient recently finished rehabilitation for her hips.  She started noticing exertional dyspnea with doing more than her ADLs the last 3-4 weeks. I think this was coincident with the time that she had finished her rehabilitation. She denies being sick when it started. Dyspnea is stable. She does not feel too debilitated to do her usual ADLs. She denies fevers, chills, chest pain, orthopnea. She actually lost weight the last month. She was concerned about the dyspnea may be caused by sarcoidosis that's why she ended up being seen by me today.  ROV 06/30/16 Pt is here for f/u on her SOB. Symbicort has not made a lot of difference. Was started on o2 2L with exertion in 05/2016 and that has made her SOB some better.  Still with exertional  dyspnea. Has been going on for 1 year. (-) CP.    Review of Systems  Constitutional: Negative.  Negative for fever and unexpected weight change.  HENT: Positive for congestion, postnasal drip and rhinorrhea. Negative for dental problem, ear pain, nosebleeds, sinus pressure, sneezing, sore throat and trouble swallowing.   Eyes: Negative.  Negative for redness and itching.  Respiratory: Positive for shortness of breath. Negative for cough, chest tightness and wheezing.   Cardiovascular: Negative.  Negative for palpitations and leg swelling.  Gastrointestinal: Negative.  Negative for nausea and vomiting.  Endocrine: Negative.   Genitourinary: Negative.  Negative for dysuria.  Musculoskeletal: Positive for arthralgias and back pain. Negative for joint swelling.  Skin: Negative.  Negative for rash.  Allergic/Immunologic: Positive for environmental allergies.  Neurological: Positive for headaches.  Hematological: Negative.  Does not bruise/bleed easily.  Psychiatric/Behavioral: Negative.  Negative for dysphoric mood. The patient is not nervous/anxious.       Objective:   Physical Exam   Vitals:  Vitals:   07/01/16 1235  BP: (!) 142/86  Pulse: (!) 57  SpO2: 93%  Weight: 175 lb (79.4 kg)  Height: _0  (1.626 m)    Constitutional/General:  Pleasant, well-nourished, well-developed, not in any distress,  Comfortably seating.  Well kempt  Body mass index is 30.04 kg/m. Wt Readings from Last 3 Encounters:  07/01/16 175 lb (79.4 kg)  05/16/16 174 lb (78.9 kg)  04/20/16 175 lb (79.4 kg)  HEENT: Pupils equal and reactive to light and accommodation. Anicteric sclerae. Normal nasal mucosa.   No oral  lesions,  mouth clear,  oropharynx clear, no postnasal drip. (-) Oral thrush. No dental caries.  Airway - Mallampati class III  Neck: No masses. Midline trachea. No JVD, (-) LAD. (-) bruits appreciated.  Respiratory/Chest: Grossly normal chest. (-) deformity. (-) Accessory muscle  use.  Symmetric expansion. (-) Tenderness on palpation.  Resonant on percussion.  Diminished BS on both lower lung zones. (-) wheezing, crackles, rhonchi (-) egophony  Cardiovascular: Regular rate and  rhythm, heart sounds normal, no murmur or gallops, no peripheral edema  Gastrointestinal:  Normal bowel sounds. Soft, non-tender. No hepatosplenomegaly.  (-) masses.   Musculoskeletal:  Normal muscle tone. Normal gait.   Extremities: Grossly normal. (-) clubbing, cyanosis.  (-) edema  Skin: (-) rash,lesions seen.   Neurological/Psychiatric : alert, oriented to time, place, person. Normal mood and affect         Assessment & Plan:  Exertional dyspnea Patient with  exertional dyspnea with doing more than ADLs x 1 year.  Saw cardiologist at the start of the year and was clerared for back surgery.   Pulmonary consult as pt with h/o Sarcoidosis.   Patient is  known to have stage I sarcoidosis which has been stable. Last seen by Dr.Wright in 2014 and PFT and CXR were N.   Recent PFT (05/16/16)  FEV1  1.59  72%, DLCO 35%. Chect ct scan (05/23/16) :Cardiomegaly. No pericardial effusion. Coronary artery calcifications, particularly dense within the left anterior descending coronary artery. Recommend correlation with any possible associated cardiac symptoms. Mild emphysematous change at the lung apices.  I think her exertional dyspnea is multifactorial. It is related to mild COPD which is currently being treated with Symbicort and oxygen. No evidence for sarcoidosis base of the chest CT scan. Chest CT scan shows dense calcification in the LAD which makes me wonder whether she has active cardiac issues causing exertional dyspnea. Certainly, patient can also have sleep apnea which can make things worse for her. Patient has obesity and has not is essentially gained significant weight last year. She is more symptomatic the last year.  Plan : 1. Continue to manage COPD. Continue Symbicort  160/4.5, 2 puffs twice a day as well as albuterol when necessary. We'll consider Spiriva on follow-up. 2. Pt is to see her PCP this week.  Suggest to re-consult Cards to evaluate her for CAD causing SOB.  Pt was also advised to follow up with his cardiologist.  3. Will get ONO.  If abn, will need a PSG. Strong family history of sleep apnea. She does not necessarily look forward to using CPAP but we can try especially if she has moderate to severe OSA. 4. Cont o2 2L with exertion.  5. Told pt to call us if with worsening SOB before f/u.    COPD (chronic obstructive pulmonary disease) (HCC) PFT (05/2016) FEV1  1.59  72%,  DLCO 35% Cont symbicort 160/4.5 2P BID, alb prn. May need Spiriva if not better. Cont o2 2l with exertion. Plan for ONO.  Needs alpha one on f/u as well as ABG.  Got flu shot. Needs prevnar 13 > will ask her PCP.  Got PNA 23.   Chronic respiratory failure (HCC) Cont O2 2L with exertion  Obesity Weight reduction  Hypersomnia Has hypersomnia, snoring, gasping, choking, obesity, crowded airway. Plan for ONO > if abn, plan for  Lab study. Has o2 with exertion.  May have issues with cpap. Anxiety    Return to clinic in 3 months      J. Shirl Harris, MD 07/01/2016   1:20 PM Pulmonary and [place] Pager: [phone] Office: [phone], Fax: [phone]

## 2016-07-01 NOTE — Patient Instructions (Addendum)
It was a pleasure taking care of you today!  You are diagnosed with Chronic Obstructive Pulmonary Disease or COPD.  COPD is a preventable and treatable disease that makes it difficult to empty air out of the lungs (airflow obstruction).  This can lead to shortness of breath.   Sometimes, when you have a lung infection, this can make your breathing worse, and will cause you to have a COPD flare-up or an acute exacerbation of COPD. Please call your primary care doctor or the office if you are having a COPD flare-up.   Smoking makes COPD worse.   Make sure you use your medications for COPD -- Maintenance medications : Symbicort 160/4.5 2P BID  Rescue medications: Albuterol 2 puffs every 4 hours as needed for shortness of breath.   Please rinse your mouth each time you use your maintenance medication.  Please call the office if you are having issues with your medications  We will order you an overnight oximetry test to determine if you will need sleep study.  Return to clinic in 3 mos with Dr. Corrie Dandy

## 2016-07-01 NOTE — Assessment & Plan Note (Signed)
Weight reduction 

## 2016-07-01 NOTE — Assessment & Plan Note (Signed)
Has hypersomnia, snoring, gasping, choking, obesity, crowded airway. Plan for ONO > if abn, plan for  Lab study. Has o2 with exertion.  May have issues with cpap. Anxiety

## 2016-07-01 NOTE — Assessment & Plan Note (Signed)
Cont O2 2L with exertion

## 2016-07-03 ENCOUNTER — Encounter: Payer: Self-pay | Admitting: Family Medicine

## 2016-07-03 ENCOUNTER — Ambulatory Visit (INDEPENDENT_AMBULATORY_CARE_PROVIDER_SITE_OTHER): Payer: Medicare Other | Admitting: Family Medicine

## 2016-07-03 VITALS — BP 120/80 | HR 65 | Temp 98.1°F | Ht 64.0 in | Wt 174.4 lb

## 2016-07-03 DIAGNOSIS — I2584 Coronary atherosclerosis due to calcified coronary lesion: Secondary | ICD-10-CM | POA: Diagnosis not present

## 2016-07-03 DIAGNOSIS — I2510 Atherosclerotic heart disease of native coronary artery without angina pectoris: Secondary | ICD-10-CM

## 2016-07-03 DIAGNOSIS — R739 Hyperglycemia, unspecified: Secondary | ICD-10-CM

## 2016-07-03 DIAGNOSIS — R0600 Dyspnea, unspecified: Secondary | ICD-10-CM

## 2016-07-03 DIAGNOSIS — E669 Obesity, unspecified: Secondary | ICD-10-CM

## 2016-07-03 DIAGNOSIS — I10 Essential (primary) hypertension: Secondary | ICD-10-CM | POA: Diagnosis not present

## 2016-07-03 DIAGNOSIS — I517 Cardiomegaly: Secondary | ICD-10-CM | POA: Diagnosis not present

## 2016-07-03 DIAGNOSIS — Z23 Encounter for immunization: Secondary | ICD-10-CM | POA: Diagnosis not present

## 2016-07-03 DIAGNOSIS — E782 Mixed hyperlipidemia: Secondary | ICD-10-CM

## 2016-07-03 DIAGNOSIS — Z6830 Body mass index (BMI) 30.0-30.9, adult: Secondary | ICD-10-CM

## 2016-07-03 HISTORY — DX: Atherosclerotic heart disease of native coronary artery without angina pectoris: I25.10

## 2016-07-03 NOTE — Patient Instructions (Signed)
Hypertension Hypertension is another name for high blood pressure. High blood pressure forces your heart to work harder to pump blood. A blood pressure reading has two numbers, which includes a higher number over a lower number (example: 110/72). Follow these instructions at home:  Have your blood pressure rechecked by your doctor.  Only take medicine as told by your doctor. Follow the directions carefully. The medicine does not work as well if you skip doses. Skipping doses also puts you at risk for problems.  Do not smoke.  Monitor your blood pressure at home as told by your doctor. Contact a doctor if:  You think you are having a reaction to the medicine you are taking.  You have repeat headaches or feel dizzy.  You have puffiness (swelling) in your ankles.  You have trouble with your vision. Get help right away if:  You get a very bad headache and are confused.  You feel weak, numb, or faint.  You get chest or belly (abdominal) pain.  You throw up (vomit).  You cannot breathe very well. This information is not intended to replace advice given to you by your health care provider. Make sure you discuss any questions you have with your health care provider. Document Released: 01/07/2008 Document Revised: 12/27/2015 Document Reviewed: 05/13/2013 Elsevier Interactive Patient Education  2017 [employer].

## 2016-07-03 NOTE — Progress Notes (Signed)
Pre visit review using our clinic review tool, if applicable. No additional management support is needed unless otherwise documented below in the visit note. 

## 2016-07-12 ENCOUNTER — Other Ambulatory Visit: Payer: Self-pay | Admitting: Family Medicine

## 2016-07-13 ENCOUNTER — Encounter: Payer: Self-pay | Admitting: Family Medicine

## 2016-07-13 DIAGNOSIS — I517 Cardiomegaly: Secondary | ICD-10-CM | POA: Insufficient documentation

## 2016-07-13 NOTE — Assessment & Plan Note (Signed)
minimize simple carbs. Increase exercise as tolerated.

## 2016-07-13 NOTE — Assessment & Plan Note (Signed)
Encouraged DASH diet, decrease po intake and increase exercise as tolerated. Needs 7-8 hours of sleep nightly. Avoid trans fats, eat small, frequent meals every 4-5 hours with lean proteins, complex carbs and healthy fats. Minimize simple carbs 

## 2016-07-13 NOTE — Assessment & Plan Note (Signed)
With some worsening dyspnea, is referred to cardiology for further consideration.

## 2016-07-13 NOTE — Assessment & Plan Note (Signed)
Well controlled, no changes to meds. Encouraged heart healthy diet such as the DASH diet and exercise as tolerated.

## 2016-07-13 NOTE — Assessment & Plan Note (Signed)
Tolerating statin, encouraged heart healthy diet, avoid trans fats, minimize simple carbs and saturated fats. Increase exercise as tolerated 

## 2016-07-13 NOTE — Progress Notes (Signed)
Patient ID: Kimberly Irwin, female   DOB: 08-13-1945, 70 y.o.   MRN: [id number]   Subjective:    Patient ID: Kimberly Irwin, female    DOB: Jun 03, 1946, 70 y.o.   MRN: [id number]  Chief Complaint  Patient presents with  . Follow-up    HPI Patient is in today for follow up. No recent illness or hospitalizations. She is noting progression of dyspnea especially with exertion. No cough or fevers. No recent illness or hospitalizations. No other acute concerns. Denies CP/palp/SOB/HA/congestion/fevers/GI or GU c/o. Taking meds as prescribed  Past Medical History:  Diagnosis Date  . Acute hip pain 10/26/2012  . Bursitis of left hip 10/26/2012  . Cardiomegaly 07/13/2016  . Chronic low back pain   . Coronary artery calcification 07/03/2016  . DDD (degenerative disc disease)    L3-4 with facet arthropathy and stenosis  . Degenerative spondylolisthesis    L4-5 grade 1 with stenosis  . GERD (gastroesophageal reflux disease)   . Heart murmur    as an infant  . History of hiatal hernia   . Hyperlipidemia   . Hypertension   . Hypothyroidism   . Hypothyroidism 10/28/2015  . Pain in joint, lower leg 10/26/2012   left   . Pneumonia    as a child several times.   . Sarcoidosis ([place])   . SOB (shortness of breath) 04/03/2016  . Tremors of nervous system     Past Surgical History:  Procedure Laterality Date  . ANTERIOR LAT LUMBAR FUSION Left 08/29/2014   Procedure: EXTREME LEFT LATERAL INTERBODY FUSION LUMBAR TWO-THREE LATERAL PLATE;  Surgeon: Charlie Pitter, MD;  Location: [facility];  Service: Neurosurgery;  Laterality: Left;  . BACK SURGERY  01/15/09   L3-4 and L4-5 decompressive laminectomy with bilateral L3, L4, and L5 decompressive foraminotomies, more than it would be required for simple interbody fusion alone.  Marland Kitchen BACK SURGERY  01/15/09   L3-4 and L4-5 posterior lumbar interbody fusion utilizing tanget interbody allograft wedge, Telamon interbody PEEk cage, and local autografting.  Marland Kitchen BACK SURGERY   01/15/09   L3, L4, and L5 posterolateral arthrodesis using segmental pedicle screw fixation and local autografting.  Marland Kitchen CARDIAC CATHETERIZATION    . CARPAL TUNNEL RELEASE Bilateral   . COLONOSCOPY    . ESOPHAGOGASTRODUODENOSCOPY    . EYE SURGERY     lens implant  . JOINT REPLACEMENT  09/13/09   Right Hip, Dr. Alvan Dame  . KYPHOPLASTY N/A 09/14/2014   Procedure: Lumbar Two Kyphoplasty/Vertebroplasty;  Surgeon: Charlie Pitter, MD;  Location: [facility];  Service: Neurosurgery;  Laterality: N/A;  Lumbar Two Kyphoplasty/Vertebroplasty  . THYROIDECTOMY    . TOTAL HIP ARTHROPLASTY Left 06/28/2013   Procedure: LEFT TOTAL  HIP ARTHROPLASTY ANTERIOR APPROACH;  Surgeon: Mauri Pole, MD;  Location: [facility];  Service: Orthopedics;  Laterality: Left;    Family History  Problem Relation Age of Onset  . Cancer Mother 34    Breast  . Heart disease Mother   . Hypertension Mother   . Hyperlipidemia Mother   . Heart attack Mother   . Asthma Maternal Grandmother     Social History   Social History  . Marital status: Widowed    Spouse name: N/A  . Number of children: 2  . Years of education: N/A   Occupational History  . Not on file.   Social History Main Topics  . Smoking status: Former Smoker    Packs/day: 0.50    Years: 32.00  Types: Cigarettes    Quit date: 07/22/1994  . Smokeless tobacco: Never Used  . Alcohol use No  . Drug use: No  . Sexual activity: Not on file   Other Topics Concern  . Not on file   Social History Narrative   Married 5 years and has 2 children (2 daughters)   Alcohol Use - no   Former Smoker - she quit 10 years ago, started when she was 24 and has had varying level of use of tobacco products from 1/2 pack to 3 packs per day.    Outpatient Medications Prior to Visit  Medication Sig Dispense Refill  . albuterol (PROVENTIL HFA;VENTOLIN HFA) 108 (90 Base) MCG/ACT inhaler Inhale 2 puffs into the lungs every 6 (six) hours as needed for wheezing or shortness of  breath. 1 Inhaler 6  . aspirin EC 81 MG tablet Take 81 mg by mouth daily.    Marland Kitchen atenolol (TENORMIN) 50 MG tablet TAKE 1 TABLET (50 MG TOTAL) BY MOUTH DAILY. 90 tablet 1  . BESIVANCE 0.6 % SUSP Reported on 07/23/2015  12  . budesonide-formoterol (SYMBICORT) 160-4.5 MCG/ACT inhaler Inhale 2 puffs into the lungs 2 (two) times daily. 1 Inhaler 5  . Calcium Carbonate-Vit D-Min (CALCIUM 1200 PO) Take 1,200 mg by mouth daily.     . Cholecalciferol (VITAMIN D) 2000 UNITS tablet Take 2,000 Units by mouth daily.    . Cinnamon 500 MG capsule Take 2,000 mg by mouth daily.      . citalopram (CELEXA) 10 MG tablet TAKE 1 TABLET (10 MG TOTAL) BY MOUTH DAILY. 90 tablet 1  . clotrimazole (CLOTRIMAZOLE AF) 1 % cream Apply 1 application topically 2 (two) times daily. 113 g 1  . Coenzyme Q10 200 MG capsule Take 200 mg by mouth daily.    . fexofenadine-pseudoephedrine (ALLEGRA-D 24) 180-240 MG 24 hr tablet Take 1 tablet by mouth daily.    Marland Kitchen gabapentin (NEURONTIN) 300 MG capsule Take 1 capsule (300 mg total) by mouth at bedtime. 90 capsule 2  . Glucosamine HCl (GLUCOSAMINE PO) Take by mouth daily.    . hydrochlorothiazide (HYDRODIURIL) 25 MG tablet TAKE 1 TABLET (25 MG TOTAL) BY MOUTH DAILY. 90 tablet 1  . levothyroxine (SYNTHROID, LEVOTHROID) 112 MCG tablet TAKE 1 TABLET (112 MCG TOTAL) BY MOUTH DAILY BEFORE BREAKFAST. 90 tablet 1  . mupirocin ointment (BACTROBAN) 2 % Apply twice daily to bridge of the nose. 22 g 0  . Omega-3 Fatty Acids (FISH OIL) 1200 MG CAPS Take 2,400 mg by mouth daily.     Marland Kitchen omeprazole (PRILOSEC) 40 MG capsule Take 1 capsule (40 mg total) by mouth daily. 90 capsule 1  . ranitidine (ZANTAC) 150 MG tablet Take 1-2 tablets (150-300 mg total) by mouth at bedtime as needed for heartburn. 60 tablet 2  . rosuvastatin (CRESTOR) 10 MG tablet Take 1 tablet (10 mg total) by mouth daily. 30 tablet 6  . traMADol (ULTRAM) 50 MG tablet Take 2 tablets (100 mg total) by mouth every 6 (six) hours as needed for  moderate pain or severe pain. 120 tablet 0  . vitamin B-12 (CYANOCOBALAMIN) 1000 MCG tablet Take 1,000 mcg by mouth daily.     No facility-administered medications prior to visit.     Allergies  Allergen Reactions  . Lipitor [Atorvastatin] Swelling  . Metronidazole Swelling  . Penicillins Swelling  . Pregabalin Other (See Comments)    REACTION: Somnolence and dizziness  . Simvastatin Other (See Comments)    Leg pain  Review of Systems  Constitutional: Positive for malaise/fatigue. Negative for fever.  HENT: Negative for congestion.   Eyes: Negative for blurred vision.  Respiratory: Positive for shortness of breath.   Cardiovascular: Negative for chest pain, palpitations and leg swelling.  Gastrointestinal: Negative for abdominal pain, blood in stool and nausea.  Genitourinary: Negative for dysuria and frequency.  Musculoskeletal: Negative for falls.  Skin: Negative for rash.  Neurological: Negative for dizziness, loss of consciousness and headaches.  Endo/Heme/Allergies: Negative for environmental allergies.  Psychiatric/Behavioral: Negative for depression. The patient is not nervous/anxious.        Objective:    Physical Exam  BP 120/80 (BP Location: Left Arm, Patient Position: Sitting, Cuff Size: Normal)   Pulse 65   Temp 98.1 F (36.7 C) (Oral)   Ht _0  (1.626 m)   Wt 174 lb 6 oz (79.1 kg)   SpO2 95%   BMI 29.93 kg/m  Wt Readings from Last 3 Encounters:  07/03/16 174 lb 6 oz (79.1 kg)  07/01/16 175 lb (79.4 kg)  05/16/16 174 lb (78.9 kg)     Lab Results  Component Value Date   WBC 11.1 (H) 04/03/2016   HGB 15.7 (H) 04/03/2016   HCT 45.2 04/03/2016   PLT 232.0 04/03/2016   GLUCOSE 124 (H) 05/16/2016   CHOL 193 04/03/2016   TRIG 246.0 (H) 04/03/2016   HDL 41.70 04/03/2016   LDLDIRECT 126.0 04/03/2016   LDLCALC 112 (H) 10/26/2015   ALT 25 04/03/2016   AST 26 04/03/2016   NA 140 05/16/2016   K 4.9 05/16/2016   CL 100 05/16/2016   CREATININE  0.94 05/16/2016   BUN 15 05/16/2016   CO2 31 05/16/2016   TSH 0.59 04/03/2016   INR 1.06 06/23/2013   HGBA1C 5.9 10/26/2015   MICROALBUR 0.2 04/27/2014    Lab Results  Component Value Date   TSH 0.59 04/03/2016   Lab Results  Component Value Date   WBC 11.1 (H) 04/03/2016   HGB 15.7 (H) 04/03/2016   HCT 45.2 04/03/2016   MCV 88.1 04/03/2016   PLT 232.0 04/03/2016   Lab Results  Component Value Date   NA 140 05/16/2016   K 4.9 05/16/2016   CO2 31 05/16/2016   GLUCOSE 124 (H) 05/16/2016   BUN 15 05/16/2016   CREATININE 0.94 05/16/2016   BILITOT 0.7 04/03/2016   ALKPHOS 56 04/03/2016   AST 26 04/03/2016   ALT 25 04/03/2016   PROT 7.5 04/03/2016   ALBUMIN 4.2 04/03/2016   CALCIUM 10.2 05/16/2016   ANIONGAP 9 09/14/2014   GFR 62.20 05/16/2016   Lab Results  Component Value Date   CHOL 193 04/03/2016   Lab Results  Component Value Date   HDL 41.70 04/03/2016   Lab Results  Component Value Date   LDLCALC 112 (H) 10/26/2015   Lab Results  Component Value Date   TRIG 246.0 (H) 04/03/2016   Lab Results  Component Value Date   CHOLHDL 5 04/03/2016   Lab Results  Component Value Date   HGBA1C 5.9 10/26/2015       Assessment & Plan:   Problem List Items Addressed This Visit    Hyperlipidemia (Chronic)    Tolerating statin, encouraged heart healthy diet, avoid trans fats, minimize simple carbs and saturated fats. Increase exercise as tolerated      Essential hypertension (Chronic)    Well controlled, no changes to meds. Encouraged heart healthy diet such as the DASH diet and exercise as tolerated.  Hyperglycemia    minimize simple carbs. Increase exercise as tolerated.       Obesity (Chronic)    Encouraged DASH diet, decrease po intake and increase exercise as tolerated. Needs 7-8 hours of sleep nightly. Avoid trans fats, eat small, frequent meals every 4-5 hours with lean proteins, complex carbs and healthy fats. Minimize simple carbs       Coronary artery calcification   Relevant Orders   Ambulatory referral to Cardiology   Cardiomegaly - Primary    With some worsening dyspnea, is referred to cardiology for further consideration.       Relevant Orders   Ambulatory referral to Cardiology    Other Visit Diagnoses    Dyspnea, unspecified type       Relevant Orders   Ambulatory referral to Cardiology      I am having Ms. Gaines maintain her Cinnamon, Fish Oil, Calcium Carbonate-Vit D-Min (CALCIUM 1200 PO), Vitamin D, Coenzyme Q10, aspirin EC, traMADol, BESIVANCE, mupirocin ointment, Glucosamine HCl (GLUCOSAMINE PO), fexofenadine-pseudoephedrine, vitamin B-12, gabapentin, hydrochlorothiazide, atenolol, levothyroxine, citalopram, clotrimazole, ranitidine, rosuvastatin, omeprazole, budesonide-formoterol, and albuterol.  No orders of the defined types were placed in this encounter.    Penni Homans, MD

## 2016-07-14 ENCOUNTER — Encounter: Payer: Self-pay | Admitting: Pulmonary Disease

## 2016-07-19 ENCOUNTER — Other Ambulatory Visit: Payer: Self-pay | Admitting: Family Medicine

## 2016-07-19 DIAGNOSIS — R2689 Other abnormalities of gait and mobility: Secondary | ICD-10-CM

## 2016-07-19 DIAGNOSIS — R519 Headache, unspecified: Secondary | ICD-10-CM

## 2016-07-19 DIAGNOSIS — M1990 Unspecified osteoarthritis, unspecified site: Secondary | ICD-10-CM

## 2016-07-19 DIAGNOSIS — R51 Headache: Secondary | ICD-10-CM

## 2016-07-19 DIAGNOSIS — K219 Gastro-esophageal reflux disease without esophagitis: Secondary | ICD-10-CM

## 2016-07-19 DIAGNOSIS — E785 Hyperlipidemia, unspecified: Secondary | ICD-10-CM

## 2016-07-19 DIAGNOSIS — E669 Obesity, unspecified: Secondary | ICD-10-CM

## 2016-07-19 DIAGNOSIS — G894 Chronic pain syndrome: Secondary | ICD-10-CM

## 2016-07-19 DIAGNOSIS — I10 Essential (primary) hypertension: Secondary | ICD-10-CM

## 2016-07-22 ENCOUNTER — Encounter (INDEPENDENT_AMBULATORY_CARE_PROVIDER_SITE_OTHER): Payer: Medicare Other | Admitting: Ophthalmology

## 2016-07-22 DIAGNOSIS — H34832 Tributary (branch) retinal vein occlusion, left eye, with macular edema: Secondary | ICD-10-CM | POA: Diagnosis not present

## 2016-07-22 DIAGNOSIS — H43813 Vitreous degeneration, bilateral: Secondary | ICD-10-CM | POA: Diagnosis not present

## 2016-07-22 DIAGNOSIS — H35033 Hypertensive retinopathy, bilateral: Secondary | ICD-10-CM

## 2016-07-22 DIAGNOSIS — I10 Essential (primary) hypertension: Secondary | ICD-10-CM | POA: Diagnosis not present

## 2016-07-31 ENCOUNTER — Telehealth: Payer: Self-pay | Admitting: Pulmonary Disease

## 2016-07-31 NOTE — Telephone Encounter (Signed)
   ONO done on 07/14/16 showed significant o2 desatn.  Kimberly Irwin, please call the patient and tell her results were abnormal and suggestive of sleep apnea. Please ask her if she wants to do a sleep study. If so, we can try her on split night sleep study if insurance will cover. Thank you  J. Shirl Harris, MD 07/31/2016, 9:44 AM  Pulmonary and [facility] Pager [phone] After 3 pm or if no answer, call [phone]

## 2016-07-31 NOTE — Telephone Encounter (Signed)
AD  Spoke with pt. And she stated that she will think about having the sleep study and call us back.  FYI

## 2016-08-26 ENCOUNTER — Other Ambulatory Visit: Payer: Self-pay | Admitting: Family Medicine

## 2016-08-31 ENCOUNTER — Other Ambulatory Visit: Payer: Self-pay | Admitting: Family Medicine

## 2016-09-03 ENCOUNTER — Encounter: Payer: Self-pay | Admitting: Internal Medicine

## 2016-09-03 ENCOUNTER — Ambulatory Visit (INDEPENDENT_AMBULATORY_CARE_PROVIDER_SITE_OTHER): Payer: Medicare Other | Admitting: Internal Medicine

## 2016-09-03 VITALS — BP 126/78 | HR 69 | Temp 97.5°F | Resp 16 | Ht 64.0 in | Wt 174.2 lb

## 2016-09-03 DIAGNOSIS — J441 Chronic obstructive pulmonary disease with (acute) exacerbation: Secondary | ICD-10-CM | POA: Diagnosis not present

## 2016-09-03 MED ORDER — DOXYCYCLINE HYCLATE 100 MG PO TABS: 100.0000 mg | ORAL_TABLET | Freq: Two times a day (BID) | ORAL | 0 refills | Status: DC

## 2016-09-03 MED ORDER — PREDNISONE 10 MG PO TABS: ORAL_TABLET | ORAL | 0 refills | Status: DC

## 2016-09-03 NOTE — Patient Instructions (Signed)
Rest, fluids , tylenol  For cough:  Take Mucinex DM twice a day as needed until better  Continue Symbicort, use your albuterol more often if you have cough, wheezing, chest congestion.  If  nasal congestion: Use OTC Nasocort or Flonase : 2 nasal sprays on each side of the nose in the morning until you feel better    Take prednisone for 5 days  Take the antibiotic as prescribed  (doxy)  Call if not gradually better over the next  10 days  Call (or go to the ER) anytime if the symptoms are severe, you have high fever, short of breath, chest pain

## 2016-09-03 NOTE — Progress Notes (Signed)
Pre visit review using our clinic review tool, if applicable. No additional management support is needed unless otherwise documented below in the visit note.

## 2016-09-03 NOTE — Progress Notes (Signed)
Subjective:    Patient ID: Kimberly Irwin, female    DOB: 10-12-1945, 71 y.o.   MRN: [id number]  DOS:  09/03/2016 Type of visit - description : acute Interval history: Symptoms started 5 weeks ago, on and off, worse for the last 4-5 days. Cough increased from baseline, occasional sputum production, brown or greenish. Nasal congestion, some nasal discharge. SOB at baseline except when she has coughing fits. She is wheezing but she is not reaching for the rescue inhaler because that's only for "emergencies".    Review of Systems + Low-grade fever. No chills No chest pain except with cough No edema No nausea or vomiting Daughter had same symptoms  Past Medical History:  Diagnosis Date  . Acute hip pain 10/26/2012  . Bursitis of left hip 10/26/2012  . Cardiomegaly 07/13/2016  . Chronic low back pain   . Coronary artery calcification 07/03/2016  . DDD (degenerative disc disease)    L3-4 with facet arthropathy and stenosis  . Degenerative spondylolisthesis    L4-5 grade 1 with stenosis  . GERD (gastroesophageal reflux disease)   . Heart murmur    as an infant  . History of hiatal hernia   . Hyperlipidemia   . Hypertension   . Hypothyroidism   . Hypothyroidism 10/28/2015  . Pain in joint, lower leg 10/26/2012   left   . Pneumonia    as a child several times.   . Sarcoidosis ([place])   . SOB (shortness of breath) 04/03/2016  . Tremors of nervous system     Past Surgical History:  Procedure Laterality Date  . ANTERIOR LAT LUMBAR FUSION Left 08/29/2014   Procedure: EXTREME LEFT LATERAL INTERBODY FUSION LUMBAR TWO-THREE LATERAL PLATE;  Surgeon: Charlie Pitter, MD;  Location: [facility];  Service: Neurosurgery;  Laterality: Left;  . BACK SURGERY  01/15/09   L3-4 and L4-5 decompressive laminectomy with bilateral L3, L4, and L5 decompressive foraminotomies, more than it would be required for simple interbody fusion alone.  Marland Kitchen BACK SURGERY  01/15/09   L3-4 and L4-5 posterior lumbar  interbody fusion utilizing tanget interbody allograft wedge, Telamon interbody PEEk cage, and local autografting.  Marland Kitchen BACK SURGERY  01/15/09   L3, L4, and L5 posterolateral arthrodesis using segmental pedicle screw fixation and local autografting.  Marland Kitchen CARDIAC CATHETERIZATION    . CARPAL TUNNEL RELEASE Bilateral   . COLONOSCOPY    . ESOPHAGOGASTRODUODENOSCOPY    . EYE SURGERY     lens implant  . JOINT REPLACEMENT  09/13/09   Right Hip, Dr. Alvan Dame  . KYPHOPLASTY N/A 09/14/2014   Procedure: Lumbar Two Kyphoplasty/Vertebroplasty;  Surgeon: Charlie Pitter, MD;  Location: [facility];  Service: Neurosurgery;  Laterality: N/A;  Lumbar Two Kyphoplasty/Vertebroplasty  . THYROIDECTOMY    . TOTAL HIP ARTHROPLASTY Left 06/28/2013   Procedure: LEFT TOTAL  HIP ARTHROPLASTY ANTERIOR APPROACH;  Surgeon: Mauri Pole, MD;  Location: [facility];  Service: Orthopedics;  Laterality: Left;    Social History   Social History  . Marital status: Widowed    Spouse name: N/A  . Number of children: 2  . Years of education: N/A   Occupational History  . Not on file.   Social History Main Topics  . Smoking status: Former Smoker    Packs/day: 0.50    Years: 32.00    Types: Cigarettes    Quit date: 07/22/1994  . Smokeless tobacco: Never Used  . Alcohol use No  . Drug use: No  .  Sexual activity: Not on file   Other Topics Concern  . Not on file   Social History Narrative   Married 36 years and has 2 children (2 daughters)   Alcohol Use - no   Former Smoker - she quit 10 years ago, started when she was 64 and has had varying level of use of tobacco products from 1/2 pack to 3 packs per day.      Allergies as of 09/03/2016      Reactions   Lipitor [atorvastatin] Swelling   Metronidazole Swelling   Penicillins Swelling   Pregabalin Other (See Comments)   REACTION: Somnolence and dizziness   Simvastatin Other (See Comments)   Leg pain      Medication List       Accurate as of 09/03/16  6:30 PM.  Always use your most recent med list.          albuterol 108 (90 Base) MCG/ACT inhaler Commonly known as:  PROVENTIL HFA;VENTOLIN HFA Inhale 2 puffs into the lungs every 6 (six) hours as needed for wheezing or shortness of breath.   aspirin EC 81 MG tablet Take 81 mg by mouth daily.   atenolol 50 MG tablet Commonly known as:  TENORMIN TAKE 1 TABLET (50 MG TOTAL) BY MOUTH DAILY.   BESIVANCE 0.6 % Susp Generic drug:  Besifloxacin HCl Reported on 07/23/2015   budesonide-formoterol 160-4.5 MCG/ACT inhaler Commonly known as:  SYMBICORT Inhale 2 puffs into the lungs 2 (two) times daily.   CALCIUM 1200 PO Take 1,200 mg by mouth daily.   Cinnamon 500 MG capsule Take 2,000 mg by mouth daily.   citalopram 10 MG tablet Commonly known as:  CELEXA TAKE 1 TABLET (10 MG TOTAL) BY MOUTH DAILY.   clotrimazole 1 % cream Commonly known as:  CLOTRIMAZOLE AF Apply 1 application topically 2 (two) times daily.   Coenzyme Q10 200 MG capsule Take 200 mg by mouth daily.   doxycycline 100 MG tablet Commonly known as:  VIBRA-TABS Take 1 tablet (100 mg total) by mouth 2 (two) times daily.   fexofenadine-pseudoephedrine 180-240 MG 24 hr tablet Commonly known as:  ALLEGRA-D 24 Take 1 tablet by mouth daily.   Fish Oil 1200 MG Caps Take 2,400 mg by mouth daily.   gabapentin 300 MG capsule Commonly known as:  NEURONTIN TAKE 1 CAPSULE (300 MG TOTAL) BY MOUTH AT BEDTIME.   GLUCOSAMINE PO Take by mouth daily.   hydrochlorothiazide 25 MG tablet Commonly known as:  HYDRODIURIL TAKE 1 TABLET (25 MG TOTAL) BY MOUTH DAILY.   levothyroxine 112 MCG tablet Commonly known as:  SYNTHROID, LEVOTHROID TAKE 1 TABLET (112 MCG TOTAL) BY MOUTH DAILY BEFORE BREAKFAST.   mupirocin ointment 2 % Commonly known as:  BACTROBAN Apply twice daily to bridge of the nose.   omeprazole 40 MG capsule Commonly known as:  PRILOSEC Take 1 capsule (40 mg total) by mouth daily.   predniSONE 10 MG  tablet Commonly known as:  DELTASONE 2 tabs a day x 5 days   ranitidine 150 MG tablet Commonly known as:  ZANTAC Take 1-2 tablets (150-300 mg total) by mouth at bedtime as needed for heartburn.   rosuvastatin 10 MG tablet Commonly known as:  CRESTOR TAKE 1 TABLET ON SATURDAY AND 1/2 TAB DAILY   traMADol 50 MG tablet Commonly known as:  ULTRAM Take 2 tablets (100 mg total) by mouth every 6 (six) hours as needed for moderate pain or severe pain.   vitamin B-12 1000 MCG  tablet Commonly known as:  CYANOCOBALAMIN Take 1,000 mcg by mouth daily.   Vitamin D 2000 units tablet Take 2,000 Units by mouth daily.          Objective:   Physical Exam BP 126/78 (BP Location: Left Arm, Patient Position: Sitting, Cuff Size: Normal)   Pulse 69   Temp 97.5 F (36.4 C) (Oral)   Resp 16   Ht 5' 4" (1.626 m)   Wt 174 lb 4 oz (79 kg)   SpO2 95%   BMI 29.91 kg/m  General:   Well developed, well nourished . NAD.  HEENT:  Normocephalic . Face symmetric, atraumatic. TMs normal. Throat symmetric and not red. Nose not congested. Sinuses no TTP Lungs:  Decreased breath sounds throughout, with forced cough she has few rhonchi but no crackles. Prolonged expiratory time but no clear-cut wheezing Normal respiratory effort, no intercostal retractions, no accessory muscle use. Heart: RRR,  no murmur.  No pretibial edema bilaterally  Skin: Not pale. Not jaundice Neurologic:  alert & oriented X3.  Speech normal, gait appropriate for age and unassisted Psych--  Cognition and judgment appear intact.  Cooperative with normal attention span and concentration.  Behavior appropriate. No anxious or depressed appearing.      Assessment & Plan:    71 year old lady with history of HTN, hyperglycemia, hyperlipidemia, thyroid disease, cardiomegaly, sarcoidosis, COPD, chronic respiratory failure , on oxygen presents with: COPD exacerbation Upon arrival to the office, without oxygen, O2 sat was 89%, no  distress. On chart review O2 sat usually 93. When she put the oxygen on, O2 sat was 95%. Plan-- doxy, Mucinex, recommend to use albuterol as needed, low dose of prednisone. Call or ER if not better

## 2016-09-09 ENCOUNTER — Encounter (INDEPENDENT_AMBULATORY_CARE_PROVIDER_SITE_OTHER): Payer: Medicare Other | Admitting: Ophthalmology

## 2016-09-09 DIAGNOSIS — I10 Essential (primary) hypertension: Secondary | ICD-10-CM | POA: Diagnosis not present

## 2016-09-09 DIAGNOSIS — H34832 Tributary (branch) retinal vein occlusion, left eye, with macular edema: Secondary | ICD-10-CM | POA: Diagnosis not present

## 2016-09-09 DIAGNOSIS — H43813 Vitreous degeneration, bilateral: Secondary | ICD-10-CM

## 2016-09-09 DIAGNOSIS — H35033 Hypertensive retinopathy, bilateral: Secondary | ICD-10-CM

## 2016-09-22 ENCOUNTER — Telehealth: Payer: Self-pay | Admitting: Family Medicine

## 2016-09-22 NOTE — Telephone Encounter (Signed)
She has chronic respiratory failure, we really need to have the opportunity to see her again, listening to her lungs and probably get a CXR, before more medicines are RX. Please schedule a check up for tomorrow

## 2016-09-22 NOTE — Telephone Encounter (Signed)
Relation to OZ:WRKY Call back number:[phone] Pharmacy: [id number] [place], [place] [phone](Phone) [phone](Fax)     Reason for call:  Patient last seen 09/03/16 by Dr. PLarose Kells and states symptoms have improved but the cough has not improved patient requesting Rx, declined appointment stating PCP advise to call office regarding concerns

## 2016-09-22 NOTE — Telephone Encounter (Signed)
Please advise.

## 2016-09-22 NOTE — Telephone Encounter (Signed)
Spoke w/ Pt, informed that since sx's have not resolved in 2-3 weeks she will need to be re-evaluated. Appt w/ Dr. Larose Kells scheduled 09/24/2015 at 2 PM.

## 2016-09-23 ENCOUNTER — Encounter: Payer: Self-pay | Admitting: Internal Medicine

## 2016-09-23 ENCOUNTER — Ambulatory Visit (HOSPITAL_BASED_OUTPATIENT_CLINIC_OR_DEPARTMENT_OTHER)
Admission: RE | Admit: 2016-09-23 | Discharge: 2016-09-23 | Disposition: A | Discharge status: Home / Self Care | Payer: Medicare Other | Source: Ambulatory Visit | Attending: Internal Medicine | Admitting: Internal Medicine

## 2016-09-23 ENCOUNTER — Ambulatory Visit (INDEPENDENT_AMBULATORY_CARE_PROVIDER_SITE_OTHER): Payer: Medicare Other | Admitting: Internal Medicine

## 2016-09-23 VITALS — BP 126/74 | HR 64 | Temp 98.5°F | Resp 16 | Ht 64.0 in | Wt 175.2 lb

## 2016-09-23 DIAGNOSIS — R918 Other nonspecific abnormal finding of lung field: Secondary | ICD-10-CM | POA: Diagnosis not present

## 2016-09-23 DIAGNOSIS — J439 Emphysema, unspecified: Secondary | ICD-10-CM | POA: Diagnosis not present

## 2016-09-23 DIAGNOSIS — R05 Cough: Secondary | ICD-10-CM

## 2016-09-23 DIAGNOSIS — J441 Chronic obstructive pulmonary disease with (acute) exacerbation: Secondary | ICD-10-CM

## 2016-09-23 DIAGNOSIS — R059 Cough, unspecified: Secondary | ICD-10-CM

## 2016-09-23 MED ORDER — PREDNISONE 10 MG PO TABS: ORAL_TABLET | ORAL | 0 refills | Status: DC

## 2016-09-23 MED ORDER — HYDROCODONE-HOMATROPINE 5-1.5 MG/5ML PO SYRP
5.0000 mL | ORAL_SOLUTION | Freq: Every evening | ORAL | 0 refills | Status: DC | PRN
Start: 2016-09-23 — End: 2016-10-21

## 2016-09-23 MED ORDER — AZITHROMYCIN 250 MG PO TABS: ORAL_TABLET | ORAL | 0 refills | Status: DC

## 2016-09-23 NOTE — Patient Instructions (Signed)
STOP BY THE FIRST FLOOR:  get the XR   For cough: Mucinex DM twice a day until better Hydrocodone at bedtime only for severe cough, watch for excessive drowsiness  Prednisone as prescribed  Take an antibiotic of Zithromax. It may interact with citalopram, for the next 4 days skip citalopram.   Use albuterol 4 times a day if severe cough  Call if not back to her baseline in the next 10 days  Call if you have severe symptoms

## 2016-09-23 NOTE — Progress Notes (Signed)
Subjective:    Patient ID: Kimberly Irwin, female    DOB: 1946/05/15, 71 y.o.   MRN: [id number]  DOS:  09/23/2016 Type of visit - description : Acute visit Interval history:  Was recently seen with COPD exacerbation, was treated, slightly better but not completely well, definitely not back to baseline. Cough is more frequent, very intense at night, sometimes unable to sleep. + Yellow-white sputum + Nasal discharge, clear.   Review of Systems Denies fever chills GERD well-controlled with current meds No hemoptysis.  Past Medical History:  Diagnosis Date  . Acute hip pain 10/26/2012  . Bursitis of left hip 10/26/2012  . Cardiomegaly 07/13/2016  . Chronic low back pain   . Coronary artery calcification 07/03/2016  . DDD (degenerative disc disease)    L3-4 with facet arthropathy and stenosis  . Degenerative spondylolisthesis    L4-5 grade 1 with stenosis  . GERD (gastroesophageal reflux disease)   . Heart murmur    as an infant  . History of hiatal hernia   . Hyperlipidemia   . Hypertension   . Hypothyroidism   . Hypothyroidism 10/28/2015  . Pain in joint, lower leg 10/26/2012   left   . Pneumonia    as a child several times.   . Sarcoidosis ([place])   . SOB (shortness of breath) 04/03/2016  . Tremors of nervous system     Past Surgical History:  Procedure Laterality Date  . ANTERIOR LAT LUMBAR FUSION Left 08/29/2014   Procedure: EXTREME LEFT LATERAL INTERBODY FUSION LUMBAR TWO-THREE LATERAL PLATE;  Surgeon: Charlie Pitter, MD;  Location: [facility];  Service: Neurosurgery;  Laterality: Left;  . BACK SURGERY  01/15/09   L3-4 and L4-5 decompressive laminectomy with bilateral L3, L4, and L5 decompressive foraminotomies, more than it would be required for simple interbody fusion alone.  Marland Kitchen BACK SURGERY  01/15/09   L3-4 and L4-5 posterior lumbar interbody fusion utilizing tanget interbody allograft wedge, Telamon interbody PEEk cage, and local autografting.  Marland Kitchen BACK SURGERY  01/15/09     L3, L4, and L5 posterolateral arthrodesis using segmental pedicle screw fixation and local autografting.  Marland Kitchen CARDIAC CATHETERIZATION    . CARPAL TUNNEL RELEASE Bilateral   . COLONOSCOPY    . ESOPHAGOGASTRODUODENOSCOPY    . EYE SURGERY     lens implant  . JOINT REPLACEMENT  09/13/09   Right Hip, Dr. Alvan Dame  . KYPHOPLASTY N/A 09/14/2014   Procedure: Lumbar Two Kyphoplasty/Vertebroplasty;  Surgeon: Charlie Pitter, MD;  Location: [facility];  Service: Neurosurgery;  Laterality: N/A;  Lumbar Two Kyphoplasty/Vertebroplasty  . THYROIDECTOMY    . TOTAL HIP ARTHROPLASTY Left 06/28/2013   Procedure: LEFT TOTAL  HIP ARTHROPLASTY ANTERIOR APPROACH;  Surgeon: Mauri Pole, MD;  Location: [facility];  Service: Orthopedics;  Laterality: Left;    Social History   Social History  . Marital status: Widowed    Spouse name: N/A  . Number of children: 2  . Years of education: N/A   Occupational History  . Not on file.   Social History Main Topics  . Smoking status: Former Smoker    Packs/day: 0.50    Years: 32.00    Types: Cigarettes    Quit date: 07/22/1994  . Smokeless tobacco: Never Used  . Alcohol use No  . Drug use: No  . Sexual activity: Not on file   Other Topics Concern  . Not on file   Social History Narrative   Married 40 years and  has 2 children (2 daughters)   Alcohol Use - no   Former Smoker - she quit 10 years ago, started when she was 55 and has had varying level of use of tobacco products from 1/2 pack to 3 packs per day.      Allergies as of 09/23/2016      Reactions   Lipitor [atorvastatin] Swelling   Metronidazole Swelling   Penicillins Swelling   Pregabalin Other (See Comments)   REACTION: Somnolence and dizziness   Simvastatin Other (See Comments)   Leg pain      Medication List       Accurate as of 09/23/16 11:59 PM. Always use your most recent med list.          albuterol 108 (90 Base) MCG/ACT inhaler Commonly known as:  PROVENTIL HFA;VENTOLIN  HFA Inhale 2 puffs into the lungs every 6 (six) hours as needed for wheezing or shortness of breath.   aspirin EC 81 MG tablet Take 81 mg by mouth daily.   atenolol 50 MG tablet Commonly known as:  TENORMIN TAKE 1 TABLET (50 MG TOTAL) BY MOUTH DAILY.   azithromycin 250 MG tablet Commonly known as:  ZITHROMAX Z-PAK 2 tabs a day the first day, then 1 tab a day x 4 days   BESIVANCE 0.6 % Susp Generic drug:  Besifloxacin HCl Reported on 07/23/2015   budesonide-formoterol 160-4.5 MCG/ACT inhaler Commonly known as:  SYMBICORT Inhale 2 puffs into the lungs 2 (two) times daily.   CALCIUM 1200 PO Take 1,200 mg by mouth daily.   Cinnamon 500 MG capsule Take 2,000 mg by mouth daily.   citalopram 10 MG tablet Commonly known as:  CELEXA TAKE 1 TABLET (10 MG TOTAL) BY MOUTH DAILY.   clotrimazole 1 % cream Commonly known as:  CLOTRIMAZOLE AF Apply 1 application topically 2 (two) times daily.   Coenzyme Q10 200 MG capsule Take 200 mg by mouth daily.   fexofenadine-pseudoephedrine 180-240 MG 24 hr tablet Commonly known as:  ALLEGRA-D 24 Take 1 tablet by mouth daily.   Fish Oil 1200 MG Caps Take 2,400 mg by mouth daily.   gabapentin 300 MG capsule Commonly known as:  NEURONTIN TAKE 1 CAPSULE (300 MG TOTAL) BY MOUTH AT BEDTIME.   GLUCOSAMINE PO Take by mouth daily.   hydrochlorothiazide 25 MG tablet Commonly known as:  HYDRODIURIL TAKE 1 TABLET (25 MG TOTAL) BY MOUTH DAILY.   HYDROcodone-homatropine 5-1.5 MG/5ML syrup Commonly known as:  HYCODAN Take 5 mLs by mouth at bedtime as needed for cough.   levothyroxine 112 MCG tablet Commonly known as:  SYNTHROID, LEVOTHROID TAKE 1 TABLET (112 MCG TOTAL) BY MOUTH DAILY BEFORE BREAKFAST.   mupirocin ointment 2 % Commonly known as:  BACTROBAN Apply twice daily to bridge of the nose.   omeprazole 40 MG capsule Commonly known as:  PRILOSEC Take 1 capsule (40 mg total) by mouth daily.   predniSONE 10 MG tablet Commonly  known as:  DELTASONE 4 tablets x 2 days, 3 tabs x 2 days, 2 tabs x 2 days, 1 tab x 2 days   ranitidine 150 MG tablet Commonly known as:  ZANTAC Take 1-2 tablets (150-300 mg total) by mouth at bedtime as needed for heartburn.   rosuvastatin 10 MG tablet Commonly known as:  CRESTOR TAKE 1 TABLET ON SATURDAY AND 1/2 TAB DAILY   traMADol 50 MG tablet Commonly known as:  ULTRAM Take 2 tablets (100 mg total) by mouth every 6 (six) hours as needed  for moderate pain or severe pain.   vitamin B-12 1000 MCG tablet Commonly known as:  CYANOCOBALAMIN Take 1,000 mcg by mouth daily.   Vitamin D 2000 units tablet Take 2,000 Units by mouth daily.          Objective:   Physical Exam BP 126/74 (BP Location: Left Arm, Patient Position: Sitting, Cuff Size: Normal)   Pulse 64   Temp 98.5 F (36.9 C) (Oral)   Resp 16   Ht _0  (1.626 m)   Wt 175 lb 4 oz (79.5 kg)   SpO2 92%   BMI 30.08 kg/m  General:   Well developed, well nourished . NAD.  HEENT:  Normocephalic . Face symmetric, atraumatic. TMs slightly bulge but not red. Nose not congested, sinuses no TTP Lungs:  decrease breath sounds, slightly increased expiratory time.   Normal respiratory effort, no intercostal retractions, no accessory muscle use. Heart: RRR,  no murmur.  No pretibial edema bilaterally  Skin: Not pale. Not jaundice Neurologic:  alert & oriented X3.  Speech normal, gait appropriate for age and unassisted Psych--  Cognition and judgment appear intact.  Cooperative with normal attention span and concentration.  Behavior appropriate. No anxious or depressed appearing.      Assessment & Plan:   71 year old lady with history of HTN, hyperglycemia, hyperlipidemia, thyroid disease, cardiomegaly, sarcoidosis, COPD, chronic respiratory failure , on oxygen presents with:   COPD exacerbation  Since the last visit, took prednisone and doxycycline, partially improved, continue with above baseline yellowish sputum  production and difficult to control cough at night. Plan: Chest x-ray, second round of antibiotics with a Z-Pak (HOLD citalopram for 4 days), prednisone for a few days, Mucinex DM, low dose hydrocodone for cough control at night Call if not improving.

## 2016-09-23 NOTE — Progress Notes (Signed)
Pre visit review using our clinic review tool, if applicable. No additional management support is needed unless otherwise documented below in the visit note.

## 2016-09-25 ENCOUNTER — Other Ambulatory Visit: Payer: Self-pay | Admitting: Family Medicine

## 2016-09-29 ENCOUNTER — Ambulatory Visit: Payer: Medicare Other | Admitting: Family Medicine

## 2016-10-01 ENCOUNTER — Ambulatory Visit (INDEPENDENT_AMBULATORY_CARE_PROVIDER_SITE_OTHER): Payer: Medicare Other | Admitting: Pulmonary Disease

## 2016-10-01 ENCOUNTER — Other Ambulatory Visit: Payer: Medicare Other

## 2016-10-01 ENCOUNTER — Encounter: Payer: Self-pay | Admitting: Pulmonary Disease

## 2016-10-01 DIAGNOSIS — R0609 Other forms of dyspnea: Secondary | ICD-10-CM | POA: Diagnosis not present

## 2016-10-01 DIAGNOSIS — J439 Emphysema, unspecified: Secondary | ICD-10-CM | POA: Diagnosis not present

## 2016-10-01 DIAGNOSIS — E669 Obesity, unspecified: Secondary | ICD-10-CM | POA: Diagnosis not present

## 2016-10-01 DIAGNOSIS — G4710 Hypersomnia, unspecified: Secondary | ICD-10-CM

## 2016-10-01 DIAGNOSIS — R0600 Dyspnea, unspecified: Secondary | ICD-10-CM

## 2016-10-01 DIAGNOSIS — Z6830 Body mass index (BMI) 30.0-30.9, adult: Secondary | ICD-10-CM | POA: Diagnosis not present

## 2016-10-01 DIAGNOSIS — J9611 Chronic respiratory failure with hypoxia: Secondary | ICD-10-CM

## 2016-10-01 NOTE — Assessment & Plan Note (Signed)
Weight reduction 

## 2016-10-01 NOTE — Assessment & Plan Note (Deleted)
Has hypersomnia, snoring, gasping, choking, obesity, crowded airway. Plan for ONO > if abn, plan for  Lab study. Has o2 with exertion.  May have issues with cpap. Anxiety

## 2016-10-01 NOTE — Assessment & Plan Note (Signed)
Cont O2 2L with exertion

## 2016-10-01 NOTE — Assessment & Plan Note (Addendum)
PFT (05/2016) FEV1  1.59  72%,  DLCO 35% Cont symbicort 160/4.5 2P BID, alb prn. Significantly improved on symbicort.  May need Spiriva if more symptomatic later on.  Cont o2 2L with exertion..  Needs alpha one today.  ONO on RA >> sign O2 desat. Cont O2 2L HS. Needs ONO on 2L Got flu shot 2017. Pt received Prevnar 13 and PNA 23 last 1-2 yrs.

## 2016-10-01 NOTE — Assessment & Plan Note (Addendum)
Patient with  exertional dyspnea with doing more than ADLs x 1 year. Improved with Symbicort the last 4 months.   Saw cardiologist at the start of the 2017 and was clerared for back surgery.   Pulmonary consult as pt with h/o Sarcoidosis.   Patient is  known to have stage I sarcoidosis which has been stable. Last seen by Dr.Wright in 2014 and PFT and CXR were N.   Recent PFT (05/16/16)  FEV1  1.59  72%, DLCO 35%. Chect ct scan (05/23/16) :Cardiomegaly. No pericardial effusion. Coronary artery calcifications, particularly dense within the left anterior descending coronary artery. Recommend correlation with any possible associated cardiac symptoms. Mild emphysematous change at the lung apices.  I think her exertional dyspnea is multifactorial. It is related to mild COPD which is currently being treated with Symbicort and oxygen. No evidence for sarcoidosis base of the chest CT scan. Chest CT scan shows dense calcification in the LAD which makes me wonder whether she has active cardiac issues causing exertional dyspnea. Certainly, patient can also have sleep apnea which can make things worse for her. Patient has obesity and has not is essentially gained significant weight last year. She is more symptomatic the last year.  Plan : 1. Continue to manage COPD. Continue Symbicort 160/4.5, 2 puffs twice a day as well as albuterol when necessary. We'll consider Spiriva on follow-up. 2. Pt is to follow up with Cardiologist.  3. Cont o2 2L with exertion and at HS. Pt does not want cpap, hence no PSG ordered.

## 2016-10-01 NOTE — Patient Instructions (Signed)
It was a pleasure taking care of you today!  You are diagnosed with Chronic Obstructive Pulmonary Disease or COPD.  COPD is a preventable and treatable disease that makes it difficult to empty air out of the lungs (airflow obstruction).  This can lead to shortness of breath.   Sometimes, when you have a lung infection, this can make your breathing worse, and will cause you to have a COPD flare-up or an acute exacerbation of COPD. Please call your primary care doctor or the office if you are having a COPD flare-up.   Smoking makes COPD worse.   Make sure you use your medications for COPD -- Maintenance medications : Symbicort 160/4.5 2puffs, 2x/day  Rescue medications: Albuterol 2 puffs every 4 hours as needed for shortness of breath.   Please rinse your mouth each time you use your maintenance medication.  Please call the office if you are having issues with your medications  We'll do a blood test today. We will do an oxygen test overnight.  Return to clinic in 6 months with NP/APP.

## 2016-10-01 NOTE — Progress Notes (Signed)
Subjective:    Patient ID: Kimberly Irwin, female    DOB: 08/04/1946, 71 y.o.   MRN: [id number]  HPI   This is the case of Kimberly Irwin, 71 y.o. Female, who was referred by Dr. Penni Homans in consultation regarding Sarcoidosis and dyspnea.   As you very well know, patient was diagnosed with sarcoidosis 20 yrs ago. She has a 15 PY smoking history, quit in her 24s.  Has not been dxed with asthma or COPD. Pt had mediastinoscopy roughly 20 yrs ago and allegedly had granulomas c/w granulomas.   She was seeing Dr. Joya Gaskins and the last time she saw him was in 2014. 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 Note current chest x-ray was negative for disease activity. Note pulmonary functions were normal including normal diffusion capacity and lung volumes. Note ACE level was normal.  Patient was deemed not a candidate for systemic treatment as she was asymptomatic.  Through the years, patient has remained stable from her lung point of view. She currently sees a cardiologist who according to the patient, has cleared her for several of her back surgeries. The last back surgery she had was January, 2017.  Patient recently finished rehabilitation for her hips.  She started noticing exertional dyspnea with doing more than her ADLs the last 3-4 weeks. I think this was coincident with the time that she had finished her rehabilitation. She denies being sick when it started. Dyspnea is stable. She does not feel too debilitated to do her usual ADLs. She denies fevers, chills, chest pain, orthopnea. She actually lost weight the last month. She was concerned about the dyspnea may be caused by sarcoidosis that's why she ended up being seen by me today.  ROV 06/30/16 Pt is here for f/u on her SOB. Symbicort has not made a lot of difference. Was started on o2 2L with exertion in 05/2016 and that has made her SOB some better.  Still with exertional  dyspnea. Has been going on for 1 year. (-) CP.    ROV 09/30/16 patient returns to the office as follow-up on her COPD and dyspnea. Since last seen, she had an ONO which was remarkable for significant O2 desaturation. She wanted to hold off on a sleep study. She had a bronchitis which lasted 4 weeks and she is almost back at her baseline. She toof doxy and amoxicillin and pred taper.   Review of Systems  Constitutional: Negative.  Negative for fever and unexpected weight change.  HENT: Positive for congestion, postnasal drip and rhinorrhea. Negative for dental problem, ear pain, nosebleeds, sinus pressure, sneezing, sore throat and trouble swallowing.   Eyes: Negative.  Negative for redness and itching.  Respiratory: Positive for shortness of breath. Negative for cough, chest tightness and wheezing.   Cardiovascular: Negative.  Negative for palpitations and leg swelling.  Gastrointestinal: Negative.  Negative for nausea and vomiting.  Endocrine: Negative.   Genitourinary: Negative.  Negative for dysuria.  Musculoskeletal: Positive for arthralgias and back pain. Negative for joint swelling.  Skin: Negative.  Negative for rash.  Allergic/Immunologic: Positive for environmental allergies.  Neurological: Positive for headaches.  Hematological: Negative.  Does not bruise/bleed easily.  Psychiatric/Behavioral: Negative.  Negative for dysphoric mood. The patient is not nervous/anxious.       Objective:   Physical Exam   Vitals:  Vitals:   10/01/16 1059  BP: 122/80  Pulse: (!) 53  SpO2: 95%  Weight: 176 lb 3.2 oz (79.9 kg)  Height: _0  (1.626 m)    Constitutional/General:  Pleasant, well-nourished, well-developed, not in any distress,  Comfortably seating.  Well kempt  Body mass index is 30.24 kg/m. Wt Readings from Last 3 Encounters:  10/01/16 176 lb 3.2 oz (79.9 kg)  09/23/16 175 lb 4 oz (79.5 kg)  09/03/16 174 lb 4 oz (79 kg)    HEENT: Pupils equal and reactive to light and  accommodation. Anicteric sclerae. Normal nasal mucosa.   No oral  lesions,  mouth clear,  oropharynx clear, no postnasal drip. (-) Oral thrush. No dental caries.  Airway - Mallampati class III  Neck: No masses. Midline trachea. No JVD, (-) LAD. (-) bruits appreciated.  Respiratory/Chest: Grossly normal chest. (-) deformity. (-) Accessory muscle use.  Symmetric expansion. (-) Tenderness on palpation.  Resonant on percussion.  Diminished BS on both lower lung zones. (-) wheezing, crackles, rhonchi (-) egophony  Cardiovascular: Regular rate and  rhythm, heart sounds normal, no murmur or gallops, no peripheral edema  Gastrointestinal:  Normal bowel sounds. Soft, non-tender. No hepatosplenomegaly.  (-) masses.   Musculoskeletal:  Normal muscle tone. Normal gait.   Extremities: Grossly normal. (-) clubbing, cyanosis.  (-) edema  Skin: (-) rash,lesions seen.   Neurological/Psychiatric : alert, oriented to time, place, person. Normal mood and affect         Assessment & Plan:  Chronic respiratory failure (HCC) Cont O2 2L with exertion  COPD (chronic obstructive pulmonary disease) (HCC) PFT (05/2016) FEV1  1.59  72%,  DLCO 35% Cont symbicort 160/4.5 2P BID, alb prn. Significantly improved on symbicort.  May need Spiriva if more symptomatic later on.  Cont o2 2L with exertion..  Needs alpha one today.  ONO on RA >> sign O2 desat. Cont O2 2L HS. Needs ONO on 2L Got flu shot 2017. Pt received Prevnar 13 and PNA 23 last 1-2 yrs.   Exertional dyspnea Patient with  exertional dyspnea with doing more than ADLs x 1 year. Improved with Symbicort the last 4 months.   Saw cardiologist at the start of the 2017 and was clerared for back surgery.   Pulmonary consult as pt with h/o Sarcoidosis.   Patient is  known to have stage I sarcoidosis which has been stable. Last seen by Dr.Wright in 2014 and PFT and CXR were N.   Recent PFT (05/16/16)  FEV1  1.59  72%, DLCO 35%. Chect ct scan  (05/23/16) :Cardiomegaly. No pericardial effusion. Coronary artery calcifications, particularly dense within the left anterior descending coronary artery. Recommend correlation with any possible associated cardiac symptoms. Mild emphysematous change at the lung apices.  I think her exertional dyspnea is multifactorial. It is related to mild COPD which is currently being treated with Symbicort and oxygen. No evidence for sarcoidosis base of the chest CT scan. Chest CT scan shows dense calcification in the LAD which makes me wonder whether she has active cardiac issues causing exertional dyspnea. Certainly, patient can also have sleep apnea which can make things worse for her. Patient has obesity and has not is essentially gained significant weight last year. She is more symptomatic the last year.  Plan : 1. Continue to manage COPD. Continue Symbicort 160/4.5, 2 puffs twice a day as well as albuterol when necessary. We'll consider Spiriva on follow-up. 2. Pt is to follow up with Cardiologist.  3. Cont o2 2L with exertion and at HS. Pt does not want cpap, hence no PSG ordered.  Obesity Weight reduction    Return to clinic in 6 months      J. Shirl Harris, MD 10/01/2016   11:47 AM [facility] [place] Pager: [phone] Office: [phone], Fax: [phone]

## 2016-10-06 LAB — ALPHA-1 ANTITRYPSIN PHENOTYPE: A-1 Antitrypsin: 118 mg/dL (ref 83–199)

## 2016-10-07 ENCOUNTER — Other Ambulatory Visit: Payer: Self-pay | Admitting: Family Medicine

## 2016-10-07 MED ORDER — ROSUVASTATIN CALCIUM 10 MG PO TABS: 10.0000 mg | ORAL_TABLET | Freq: Every day | ORAL | 1 refills | Status: DC

## 2016-10-08 DIAGNOSIS — R0602 Shortness of breath: Secondary | ICD-10-CM | POA: Diagnosis not present

## 2016-10-08 DIAGNOSIS — J449 Chronic obstructive pulmonary disease, unspecified: Secondary | ICD-10-CM | POA: Diagnosis not present

## 2016-10-08 DIAGNOSIS — M5136 Other intervertebral disc degeneration, lumbar region: Secondary | ICD-10-CM | POA: Diagnosis not present

## 2016-10-09 DIAGNOSIS — M5136 Other intervertebral disc degeneration, lumbar region: Secondary | ICD-10-CM | POA: Diagnosis not present

## 2016-10-09 DIAGNOSIS — R0602 Shortness of breath: Secondary | ICD-10-CM | POA: Diagnosis not present

## 2016-10-09 DIAGNOSIS — J449 Chronic obstructive pulmonary disease, unspecified: Secondary | ICD-10-CM | POA: Diagnosis not present

## 2016-10-14 ENCOUNTER — Telehealth: Payer: Self-pay | Admitting: Pulmonary Disease

## 2016-10-14 NOTE — Telephone Encounter (Signed)
Patient had an ONO on RA which shows significant oxygen desaturation. She wanted to hold off on sleep study.  She had an ONO on 2L O2 on 10/2016 which showed significant o2 desatn BUT the test was suboptimal with a lot of artifacts.  My sense is she should be OK on 2L O2 at [facility] for now. Ideally, will need a lab study but pt wants to hold off.   Jasmine : pls tell pt results on 2L at HS showed significant o2 desaturation BUT it was a suboptimal study. Ideally, she needs a sleep study but she wants to hold off. I think she should be okay on 2 L oxygen at bedtime. Plan to repeat ONO on f/u.    Monica Becton, MD 10/14/2016, 11:18 AM [place] [facility] Pager [phone] After 3 pm or if no answer, call [phone]

## 2016-10-14 NOTE — Telephone Encounter (Signed)
Spoke with pt and made her aware of her results per AD. Pt still decided to hold off on the sleep study at this time. She had no further questions. Nothing further is needed at this time.

## 2016-10-17 NOTE — Progress Notes (Signed)
Subjective:   Kimberly Irwin is a 71 y.o. female who presents for an Initial Medicare Annual Wellness Visit.  Review of Systems  No ROS.  Medicare Wellness Visit. Cardiac Risk Factors include: advanced age (>27mn, >>64women);dyslipidemia;hypertension Sleep patterns: Wears O2 at night. Sleeps 8-10 hrs per night. Feels rested.   Home Safety/Smoke Alarms: Feels safe in home. Smoke alarms in place.  Living environment; residence and Firearm Safety: Daughter lives with pt. 1 story. Gun safely stored. Seat Belt Safety/Bike Helmet: Wears seat belt.   Counseling:   Eye Exam- wears glasses. Pt states she has implants. Dr.Matthews every 6-8 weeks for eye injections. Dental- Dentures. Gum care discussed.  Female:   Pap- Pt can't recall. Will discuss w/ Dr.Blyth today.     Mammo- Last 05/23/16: BI-RADS Category 1: negative.      Dexa scan-Last 03/25/11: Osteopenia.  ORDERED TODAY.  CCS- Last 08/30/01: diverticulosis. Repeat in 5 years per report. Cologuard 07/12/15-normal.     Objective:    Today's Vitals   10/21/16 0911  BP: 126/68  Pulse: 60  SpO2: 93%  Weight: 178 lb 12.8 oz (81.1 kg)  Height: _0  (1.626 m)   Body mass index is 30.69 kg/m.   Current Medications (verified) Outpatient Encounter Prescriptions as of 10/21/2016  Medication Sig  . albuterol (PROVENTIL HFA;VENTOLIN HFA) 108 (90 Base) MCG/ACT inhaler Inhale 2 puffs into the lungs every 6 (six) hours as needed for wheezing or shortness of breath.  .Marland Kitchenaspirin EC 81 MG tablet Take 81 mg by mouth daily.  .Marland Kitchenatenolol (TENORMIN) 50 MG tablet TAKE 1 TABLET (50 MG TOTAL) BY MOUTH DAILY.  . BESIVANCE 0.6 % SUSP Reported on 07/23/2015  . budesonide-formoterol (SYMBICORT) 160-4.5 MCG/ACT inhaler Inhale 2 puffs into the lungs 2 (two) times daily.  . Calcium Carbonate-Vit D-Min (CALCIUM 1200 PO) Take 1,200 mg by mouth daily.   . Cholecalciferol (VITAMIN D) 2000 UNITS tablet Take 2,000 Units by mouth daily.  . Cinnamon 500 MG capsule  Take 2,000 mg by mouth daily.    . citalopram (CELEXA) 10 MG tablet TAKE 1 TABLET (10 MG TOTAL) BY MOUTH DAILY.  .Marland KitchenCoenzyme Q10 200 MG capsule Take 200 mg by mouth daily.  . fexofenadine-pseudoephedrine (ALLEGRA-D 24) 180-240 MG 24 hr tablet Take 1 tablet by mouth daily.  .Marland Kitchengabapentin (NEURONTIN) 300 MG capsule TAKE 1 CAPSULE (300 MG TOTAL) BY MOUTH AT BEDTIME.  .Marland KitchenGlucosamine HCl (GLUCOSAMINE PO) Take by mouth daily.  . hydrochlorothiazide (HYDRODIURIL) 25 MG tablet TAKE 1 TABLET (25 MG TOTAL) BY MOUTH DAILY.  .Marland Kitchenlevothyroxine (SYNTHROID, LEVOTHROID) 112 MCG tablet TAKE 1 TABLET (112 MCG TOTAL) BY MOUTH DAILY BEFORE BREAKFAST.  .Marland KitchenOmega-3 Fatty Acids (FISH OIL) 1200 MG CAPS Take 2,400 mg by mouth daily.   .Marland Kitchenomeprazole (PRILOSEC) 40 MG capsule Take 1 capsule (40 mg total) by mouth daily.  . ranitidine (ZANTAC) 150 MG tablet Take 1-2 tablets (150-300 mg total) by mouth at bedtime as needed for heartburn.  . rosuvastatin (CRESTOR) 10 MG tablet Take 1 tablet (10 mg total) by mouth daily.  . traMADol (ULTRAM) 50 MG tablet Take 2 tablets (100 mg total) by mouth every 6 (six) hours as needed for moderate pain or severe pain.  . vitamin B-12 (CYANOCOBALAMIN) 1000 MCG tablet Take 1,000 mcg by mouth daily.  . clotrimazole (CLOTRIMAZOLE AF) 1 % cream Apply 1 application topically 2 (two) times daily. (Patient not taking: Reported on 10/21/2016)  . mupirocin ointment (BACTROBAN) 2 % Apply  twice daily to bridge of the nose. (Patient not taking: Reported on 10/21/2016)  . [DISCONTINUED] HYDROcodone-homatropine (HYCODAN) 5-1.5 MG/5ML syrup Take 5 mLs by mouth at bedtime as needed for cough.   No facility-administered encounter medications on file as of 10/21/2016.     Allergies (verified) Lipitor [atorvastatin]; Metronidazole; Penicillins; Pregabalin; and Simvastatin   History: Past Medical History:  Diagnosis Date  . Acute hip pain 10/26/2012  . Bronchitis 09/04/2016  . Bursitis of left hip 10/26/2012  .  Cardiomegaly 07/13/2016  . Chronic low back pain   . Coronary artery calcification 07/03/2016  . DDD (degenerative disc disease)    L3-4 with facet arthropathy and stenosis  . Degenerative spondylolisthesis    L4-5 grade 1 with stenosis  . GERD (gastroesophageal reflux disease)   . Heart murmur    as an infant  . History of hiatal hernia   . Hyperlipidemia   . Hypertension   . Hypothyroidism   . Hypothyroidism 10/28/2015  . Pain in joint, lower leg 10/26/2012   left   . Pneumonia    as a child several times.   . Sarcoidosis ([place])   . SOB (shortness of breath) 04/03/2016  . Tremors of nervous system    Past Surgical History:  Procedure Laterality Date  . ANTERIOR LAT LUMBAR FUSION Left 08/29/2014   Procedure: EXTREME LEFT LATERAL INTERBODY FUSION LUMBAR TWO-THREE LATERAL PLATE;  Surgeon: Charlie Pitter, MD;  Location: [place] [facility];  Service: Neurosurgery;  Laterality: Left;  . BACK SURGERY  01/15/09   L3-4 and L4-5 decompressive laminectomy with bilateral L3, L4, and L5 decompressive foraminotomies, more than it would be required for simple interbody fusion alone.  Marland Kitchen BACK SURGERY  01/15/09   L3-4 and L4-5 posterior lumbar interbody fusion utilizing tanget interbody allograft wedge, Telamon interbody PEEk cage, and local autografting.  Marland Kitchen BACK SURGERY  01/15/09   L3, L4, and L5 posterolateral arthrodesis using segmental pedicle screw fixation and local autografting.  Marland Kitchen CARDIAC CATHETERIZATION    . CARPAL TUNNEL RELEASE Bilateral   . COLONOSCOPY    . ESOPHAGOGASTRODUODENOSCOPY    . EYE SURGERY     lens implant  . JOINT REPLACEMENT  09/13/09   Right Hip, Dr. Alvan Dame  . KYPHOPLASTY N/A 09/14/2014   Procedure: Lumbar Two Kyphoplasty/Vertebroplasty;  Surgeon: Charlie Pitter, MD;  Location: [place] [facility];  Service: Neurosurgery;  Laterality: N/A;  Lumbar Two Kyphoplasty/Vertebroplasty  . THYROIDECTOMY    . TOTAL HIP ARTHROPLASTY Left 06/28/2013   Procedure: LEFT TOTAL  HIP ARTHROPLASTY ANTERIOR  APPROACH;  Surgeon: Mauri Pole, MD;  Location: [facility];  Service: Orthopedics;  Laterality: Left;   Family History  Problem Relation Age of Onset  . Cancer Mother 44    Breast  . Heart disease Mother   . Hypertension Mother   . Hyperlipidemia Mother   . Heart attack Mother   . Asthma Maternal Grandmother    Social History   Occupational History  . Not on file.   Social History Main Topics  . Smoking status: Former Smoker    Packs/day: 0.50    Years: 32.00    Types: Cigarettes    Quit date: 07/22/1994  . Smokeless tobacco: Never Used  . Alcohol use No  . Drug use: No  . Sexual activity: No    Tobacco Counseling Counseling given: No   Activities of Daily Living In your present state of health, do you have any difficulty performing the following activities: 10/21/2016  Hearing? N  Vision? N  Difficulty concentrating or making decisions? N  Walking or climbing stairs? Y  Dressing or bathing? N  Doing errands, shopping? N  Preparing Food and eating ? N  Using the Toilet? N  In the past six months, have you accidently leaked urine? Y  Do you have problems with loss of bowel control? N  Managing your Medications? N  Managing your Finances? N  Housekeeping or managing your Housekeeping? N  Some recent data might be hidden    Immunizations and Health Maintenance Immunization History  Administered Date(s) Administered  . Influenza Split 05/08/2011, 05/26/2012  . Influenza Whole 06/03/2006, 05/31/2007, 04/26/2008, 05/23/2009, 05/20/2010  . Influenza, High Dose Seasonal PF 05/23/2013, 04/03/2016  . Influenza,inj,Quad PF,36+ Mos 04/27/2014, 06/15/2015  . Pneumococcal Conjugate-13 04/27/2014  . Pneumococcal Polysaccharide-23 06/18/2011, 07/03/2016  . Tdap 06/18/2011   There are no preventive care reminders to display for this patient.  Patient Care Team: Mosie Lukes, MD as PCP - General (Family Medicine) Elsie Stain, MD as Attending Physician (Pulmonary  Disease) Earnie Larsson, MD as Consulting Physician (Neurosurgery) Paralee Cancel, MD as Consulting Physician (Orthopedic Surgery) [place], DO as Consulting Physician (Internal Medicine)  Indicate any recent Medical Services you may have received from other than [facility] providers in the past year (date may be approximate).     Assessment:   This is a routine wellness examination for Myrlene. Physical assessment deferred to PCP.  Hearing/Vision screen No exam data present  Dietary issues and exercise activities discussed: Current Exercise Habits: The patient does not participate in regular exercise at present, Exercise limited by: respiratory conditions(s)   Diet (meal preparation, eat out, water intake, caffeinated beverages, dairy products, fruits and vegetables):  Breakfast: Nabs and Coffee Lunch: soup or sandwich. Water. Dinner: Meat and potatoes and vegetables. Water. Pt states she snacks a lot at night.  Goals      Patient Stated   . Remain active and independent.   (pt-stated)      Depression Screen PHQ 2/9 Scores 10/21/2016 10/11/2015 04/27/2014  PHQ - 2 Score 0 0 0    Fall Risk Fall Risk  10/21/2016 10/11/2015 04/27/2014  Falls in the past year? No No No  Risk for fall due to : - Impaired balance/gait -  Risk for fall due to (comments): - Pt reports stumbling a lot.  Unable to walk in a straight line.   -    Cognitive Function: MMSE - Mini Mental State Exam 10/21/2016 10/11/2015  Orientation to time 5 5  Orientation to Place 5 5  Registration 3 3  Attention/ Calculation 5 5  Recall 3 3  Language- name 2 objects 2 2  Language- repeat 1 1  Language- follow 3 step command 3 3  Language- read & follow direction 1 1  Write a sentence 1 1  Copy design 1 1  Total score 30 30        Screening Tests Health Maintenance  Topic Date Due  . MAMMOGRAM  05/23/2018  . Fecal DNA (Cologuard)  07/11/2018  . TETANUS/TDAP  06/17/2021  . INFLUENZA VACCINE  Completed  . DEXA SCAN   Completed  . Hepatitis C Screening  Completed  . PNA vac Low Risk Adult  Completed      Plan:     Follow up with Dr.Blyth today as scheduled.  Eat heart healthy diet (full of fruits, vegetables, whole grains, lean protein, water--limit salt, fat, and sugar intake) and increase physical activity as tolerated.  Continue doing brain stimulating activities (puzzles, reading, adult coloring books, staying active) to keep memory sharp.   During the course of the visit, Adin was educated and counseled about the following appropriate screening and preventive services:   Vaccines to include Pneumoccal, Influenza, Hepatitis B, Td, HCV  Cardiovascular disease screening  Colorectal cancer screening  Bone density screening  Diabetes screening  Glaucoma screening  Mammography/PAP  Nutrition counseling   Patient Instructions (the written plan) were given to the patient.    Shela Nevin, [place]   10/21/2016   RN AWV note reviewed. Agree with documention and plan.  Penni Homans, MD

## 2016-10-17 NOTE — Progress Notes (Signed)
Pre visit review using our clinic review tool, if applicable. No additional management support is needed unless otherwise documented below in the visit note. 

## 2016-10-21 ENCOUNTER — Ambulatory Visit (INDEPENDENT_AMBULATORY_CARE_PROVIDER_SITE_OTHER): Payer: Medicare Other | Admitting: Family Medicine

## 2016-10-21 ENCOUNTER — Encounter: Payer: Self-pay | Admitting: Family Medicine

## 2016-10-21 ENCOUNTER — Ambulatory Visit (HOSPITAL_BASED_OUTPATIENT_CLINIC_OR_DEPARTMENT_OTHER)
Admission: RE | Admit: 2016-10-21 | Discharge: 2016-10-21 | Disposition: A | Discharge status: Home / Self Care | Payer: Medicare Other | Source: Ambulatory Visit | Attending: Family Medicine | Admitting: Family Medicine

## 2016-10-21 VITALS — BP 126/68 | HR 60 | Ht 64.0 in | Wt 178.8 lb

## 2016-10-21 DIAGNOSIS — E669 Obesity, unspecified: Secondary | ICD-10-CM

## 2016-10-21 DIAGNOSIS — I10 Essential (primary) hypertension: Secondary | ICD-10-CM | POA: Diagnosis not present

## 2016-10-21 DIAGNOSIS — Z780 Asymptomatic menopausal state: Secondary | ICD-10-CM | POA: Diagnosis not present

## 2016-10-21 DIAGNOSIS — E782 Mixed hyperlipidemia: Secondary | ICD-10-CM

## 2016-10-21 DIAGNOSIS — E2839 Other primary ovarian failure: Secondary | ICD-10-CM

## 2016-10-21 DIAGNOSIS — G4734 Idiopathic sleep related nonobstructive alveolar hypoventilation: Secondary | ICD-10-CM

## 2016-10-21 DIAGNOSIS — E039 Hypothyroidism, unspecified: Secondary | ICD-10-CM | POA: Diagnosis not present

## 2016-10-21 DIAGNOSIS — R739 Hyperglycemia, unspecified: Secondary | ICD-10-CM | POA: Diagnosis not present

## 2016-10-21 DIAGNOSIS — Z0000 Encounter for general adult medical examination without abnormal findings: Secondary | ICD-10-CM | POA: Diagnosis not present

## 2016-10-21 DIAGNOSIS — I517 Cardiomegaly: Secondary | ICD-10-CM

## 2016-10-21 DIAGNOSIS — Z6830 Body mass index (BMI) 30.0-30.9, adult: Secondary | ICD-10-CM

## 2016-10-21 DIAGNOSIS — R251 Tremor, unspecified: Secondary | ICD-10-CM | POA: Diagnosis not present

## 2016-10-21 DIAGNOSIS — J9611 Chronic respiratory failure with hypoxia: Secondary | ICD-10-CM

## 2016-10-21 HISTORY — DX: Chronic respiratory failure with hypoxia: J96.11

## 2016-10-21 LAB — COMPREHENSIVE METABOLIC PANEL
ALT: 18 U/L (ref 0–35)
AST: 20 U/L (ref 0–37)
Albumin: 4.1 g/dL (ref 3.5–5.2)
Alkaline Phosphatase: 52 U/L (ref 39–117)
BILIRUBIN TOTAL: 0.7 mg/dL (ref 0.2–1.2)
BUN: 17 mg/dL (ref 6–23)
CALCIUM: 10 mg/dL (ref 8.4–10.5)
CHLORIDE: 102 meq/L (ref 96–112)
CO2: 31 meq/L (ref 19–32)
CREATININE: 0.82 mg/dL (ref 0.40–1.20)
GFR: 72.73 mL/min (ref >60.00)
GLUCOSE: 107 mg/dL — AB (ref 70–99)
Potassium: 4.2 mEq/L (ref 3.5–5.1)
SODIUM: 139 meq/L (ref 135–145)
Total Protein: 7.2 g/dL (ref 6.0–8.3)

## 2016-10-21 LAB — LIPID PANEL
CHOL/HDL RATIO: 4
CHOLESTEROL: 181 mg/dL (ref 0–200)
HDL: 44.2 mg/dL (ref >39.00)
NonHDL: 136.97
Triglycerides: 226 mg/dL — ABNORMAL HIGH (ref 0.0–149.0)
VLDL: 45.2 mg/dL — ABNORMAL HIGH (ref 0.0–40.0)

## 2016-10-21 LAB — CBC
HCT: 43.6 % (ref 36.0–46.0)
Hemoglobin: 15 g/dL (ref 12.0–15.0)
MCHC: 34.3 g/dL (ref 30.0–36.0)
MCV: 89.2 fl (ref 78.0–100.0)
Platelets: 221 10*3/uL (ref 150.0–400.0)
RBC: 4.88 Mil/uL (ref 3.87–5.11)
RDW: 14.3 % (ref 11.5–15.5)
WBC: 9.5 10*3/uL (ref 4.0–10.5)

## 2016-10-21 LAB — LDL CHOLESTEROL, DIRECT: LDL DIRECT: 110 mg/dL

## 2016-10-21 LAB — TSH: TSH: 0.28 u[IU]/mL — AB (ref 0.35–4.50)

## 2016-10-21 LAB — HEMOGLOBIN A1C: HEMOGLOBIN A1C: 6.1 % (ref 4.6–6.5)

## 2016-10-21 MED ORDER — ATENOLOL 25 MG PO TABS: 25.0000 mg | ORAL_TABLET | Freq: Every day | ORAL | 3 refills | Status: DC

## 2016-10-21 MED ORDER — KETOCONAZOLE 2 % EX SHAM: 1.0000 "application " | MEDICATED_SHAMPOO | CUTANEOUS | Status: DC

## 2016-10-21 MED ORDER — ATENOLOL 50 MG PO TABS: 50.0000 mg | ORAL_TABLET | ORAL | 1 refills | Status: DC

## 2016-10-21 NOTE — Progress Notes (Signed)
Patient ID: Kimberly Irwin, female   DOB: [birth year], 71 y.o.   MRN: [id number]   Subjective:    Patient ID: Kimberly Irwin, female    DOB: 1946-04-22, 71 y.o.   MRN: [id number]  Chief Complaint  Patient presents with  . Medicare Wellness    HPI Patient is in today for Annual wellness visit with health coach and follow-up on numerous medical conditions including hypertension, hyperlipidemia and hyperglycemia. She denies polyuria or polydipsia. She's been trying to stay active. Is maintaining a low carb diet on a good day. No recent febrile illness or acute hospitalization. Denies CP/palp/SOB/HA/congestion/fevers/GI or GU c/o. Taking meds as prescribed  Past Medical History:  Diagnosis Date  . Acute hip pain 10/26/2012  . Bronchitis 09/04/2016  . Bursitis of left hip 10/26/2012  . Cardiomegaly 07/13/2016  . Chronic low back pain   . Coronary artery calcification 07/03/2016  . DDD (degenerative disc disease)    L3-4 with facet arthropathy and stenosis  . Degenerative spondylolisthesis    L4-5 grade 1 with stenosis  . GERD (gastroesophageal reflux disease)   . Heart murmur    as an infant  . History of hiatal hernia   . Hyperlipidemia   . Hypertension   . Hypothyroidism   . Hypothyroidism 10/28/2015  . Hypoxia, sleep related 10/21/2016  . Pain in joint, lower leg 10/26/2012   left   . Pneumonia    as a child several times.   . Sarcoidosis ([place])   . SOB (shortness of breath) 04/03/2016  . Tremors of nervous system     Past Surgical History:  Procedure Laterality Date  . ANTERIOR LAT LUMBAR FUSION Left 08/29/2014   Procedure: EXTREME LEFT LATERAL INTERBODY FUSION LUMBAR TWO-THREE LATERAL PLATE;  Surgeon: Charlie Pitter, MD;  Location: [facility];  Service: Neurosurgery;  Laterality: Left;  . BACK SURGERY  01/15/09   L3-4 and L4-5 decompressive laminectomy with bilateral L3, L4, and L5 decompressive foraminotomies, more than it would be required for simple interbody fusion alone.  Marland Kitchen BACK  SURGERY  01/15/09   L3-4 and L4-5 posterior lumbar interbody fusion utilizing tanget interbody allograft wedge, Telamon interbody PEEk cage, and local autografting.  Marland Kitchen BACK SURGERY  01/15/09   L3, L4, and L5 posterolateral arthrodesis using segmental pedicle screw fixation and local autografting.  Marland Kitchen CARDIAC CATHETERIZATION    . CARPAL TUNNEL RELEASE Bilateral   . COLONOSCOPY    . ESOPHAGOGASTRODUODENOSCOPY    . EYE SURGERY     lens implant  . JOINT REPLACEMENT  09/13/09   Right Hip, Dr. Alvan Dame  . KYPHOPLASTY N/A 09/14/2014   Procedure: Lumbar Two Kyphoplasty/Vertebroplasty;  Surgeon: Charlie Pitter, MD;  Location: [facility];  Service: Neurosurgery;  Laterality: N/A;  Lumbar Two Kyphoplasty/Vertebroplasty  . THYROIDECTOMY    . TOTAL HIP ARTHROPLASTY Left 06/28/2013   Procedure: LEFT TOTAL  HIP ARTHROPLASTY ANTERIOR APPROACH;  Surgeon: Mauri Pole, MD;  Location: [facility];  Service: Orthopedics;  Laterality: Left;    Family History  Problem Relation Age of Onset  . Cancer Mother 38    Breast  . Heart disease Mother   . Hypertension Mother   . Hyperlipidemia Mother   . Heart attack Mother   . Asthma Maternal Grandmother     Social History   Social History  . Marital status: Widowed    Spouse name: N/A  . Number of children: 2  . Years of education: N/A   Occupational History  .  Not on file.   Social History Main Topics  . Smoking status: Former Smoker    Packs/day: 0.50    Years: 32.00    Types: Cigarettes    Quit date: 07/22/1994  . Smokeless tobacco: Never Used  . Alcohol use No  . Drug use: No  . Sexual activity: No   Other Topics Concern  . Not on file   Social History Narrative   Married 11 years and has 2 children (2 daughters)   Alcohol Use - no   Former Smoker - she quit 10 years ago, started when she was 31 and has had varying level of use of tobacco products from 1/2 pack to 3 packs per day.    Outpatient Medications Prior to Visit  Medication Sig  Dispense Refill  . albuterol (PROVENTIL HFA;VENTOLIN HFA) 108 (90 Base) MCG/ACT inhaler Inhale 2 puffs into the lungs every 6 (six) hours as needed for wheezing or shortness of breath. 1 Inhaler 6  . aspirin EC 81 MG tablet Take 81 mg by mouth daily.    Marland Kitchen BESIVANCE 0.6 % SUSP Reported on 07/23/2015  12  . budesonide-formoterol (SYMBICORT) 160-4.5 MCG/ACT inhaler Inhale 2 puffs into the lungs 2 (two) times daily. 1 Inhaler 5  . Calcium Carbonate-Vit D-Min (CALCIUM 1200 PO) Take 1,200 mg by mouth daily.     . Cholecalciferol (VITAMIN D) 2000 UNITS tablet Take 2,000 Units by mouth daily.    . Cinnamon 500 MG capsule Take 2,000 mg by mouth daily.      . citalopram (CELEXA) 10 MG tablet TAKE 1 TABLET (10 MG TOTAL) BY MOUTH DAILY. 90 tablet 1  . Coenzyme Q10 200 MG capsule Take 200 mg by mouth daily.    . fexofenadine-pseudoephedrine (ALLEGRA-D 24) 180-240 MG 24 hr tablet Take 1 tablet by mouth daily.    Marland Kitchen gabapentin (NEURONTIN) 300 MG capsule TAKE 1 CAPSULE (300 MG TOTAL) BY MOUTH AT BEDTIME. 90 capsule 2  . Glucosamine HCl (GLUCOSAMINE PO) Take by mouth daily.    . hydrochlorothiazide (HYDRODIURIL) 25 MG tablet TAKE 1 TABLET (25 MG TOTAL) BY MOUTH DAILY. 90 tablet 1  . levothyroxine (SYNTHROID, LEVOTHROID) 112 MCG tablet TAKE 1 TABLET (112 MCG TOTAL) BY MOUTH DAILY BEFORE BREAKFAST. 90 tablet 1  . Omega-3 Fatty Acids (FISH OIL) 1200 MG CAPS Take 2,400 mg by mouth daily.     Marland Kitchen omeprazole (PRILOSEC) 40 MG capsule Take 1 capsule (40 mg total) by mouth daily. 90 capsule 1  . ranitidine (ZANTAC) 150 MG tablet Take 1-2 tablets (150-300 mg total) by mouth at bedtime as needed for heartburn. 60 tablet 2  . rosuvastatin (CRESTOR) 10 MG tablet Take 1 tablet (10 mg total) by mouth daily. 90 tablet 1  . traMADol (ULTRAM) 50 MG tablet Take 2 tablets (100 mg total) by mouth every 6 (six) hours as needed for moderate pain or severe pain. 120 tablet 0  . vitamin B-12 (CYANOCOBALAMIN) 1000 MCG tablet Take 1,000 mcg  by mouth daily.    Marland Kitchen atenolol (TENORMIN) 50 MG tablet TAKE 1 TABLET (50 MG TOTAL) BY MOUTH DAILY. 90 tablet 1  . clotrimazole (CLOTRIMAZOLE AF) 1 % cream Apply 1 application topically 2 (two) times daily. (Patient not taking: Reported on 10/21/2016) 113 g 1  . mupirocin ointment (BACTROBAN) 2 % Apply twice daily to bridge of the nose. (Patient not taking: Reported on 10/21/2016) 22 g 0  . HYDROcodone-homatropine (HYCODAN) 5-1.5 MG/5ML syrup Take 5 mLs by mouth at bedtime as needed  for cough. 120 mL 0   No facility-administered medications prior to visit.     Allergies  Allergen Reactions  . Lipitor [Atorvastatin] Swelling  . Metronidazole Swelling  . Penicillins Swelling  . Pregabalin Other (See Comments)    REACTION: Somnolence and dizziness  . Simvastatin Other (See Comments)    Leg pain    Review of Systems  Constitutional: Positive for malaise/fatigue. Negative for fever.  HENT: Negative for congestion.   Eyes: Negative for blurred vision.  Respiratory: Negative for shortness of breath.   Cardiovascular: Negative for chest pain, palpitations and leg swelling.  Gastrointestinal: Negative for abdominal pain, blood in stool and nausea.  Genitourinary: Negative for dysuria and frequency.  Musculoskeletal: Negative for falls.  Skin: Negative for rash.  Neurological: Negative for dizziness, loss of consciousness and headaches.  Endo/Heme/Allergies: Negative for environmental allergies.  Psychiatric/Behavioral: Negative for depression. The patient is not nervous/anxious.        Objective:    Physical Exam  Constitutional: She is oriented to person, place, and time. She appears well-developed and well-nourished. No distress.  HENT:  Head: Normocephalic and atraumatic.  Nose: Nose normal.  Eyes: Right eye exhibits no discharge. Left eye exhibits no discharge.  Neck: Normal range of motion. Neck supple.  Cardiovascular: Normal rate and regular rhythm.   No murmur  heard. Pulmonary/Chest: Effort normal and breath sounds normal.  Abdominal: Soft. Bowel sounds are normal. There is no tenderness.  Musculoskeletal: She exhibits no edema.  Neurological: She is alert and oriented to person, place, and time.  Skin: Skin is warm and dry.  Psychiatric: She has a normal mood and affect.  Nursing note and vitals reviewed.   BP 126/68 (BP Location: Right Arm, Patient Position: Sitting, Cuff Size: Large)   Pulse 60   Ht _0  (1.626 m)   Wt 178 lb 12.8 oz (81.1 kg)   SpO2 93%   BMI 30.69 kg/m  Wt Readings from Last 3 Encounters:  10/21/16 178 lb 12.8 oz (81.1 kg)  10/01/16 176 lb 3.2 oz (79.9 kg)  09/23/16 175 lb 4 oz (79.5 kg)     Lab Results  Component Value Date   WBC 11.1 (H) 04/03/2016   HGB 15.7 (H) 04/03/2016   HCT 45.2 04/03/2016   PLT 232.0 04/03/2016   GLUCOSE 124 (H) 05/16/2016   CHOL 193 04/03/2016   TRIG 246.0 (H) 04/03/2016   HDL 41.70 04/03/2016   LDLDIRECT 126.0 04/03/2016   LDLCALC 112 (H) 10/26/2015   ALT 25 04/03/2016   AST 26 04/03/2016   NA 140 05/16/2016   K 4.9 05/16/2016   CL 100 05/16/2016   CREATININE 0.94 05/16/2016   BUN 15 05/16/2016   CO2 31 05/16/2016   TSH 0.59 04/03/2016   INR 1.06 06/23/2013   HGBA1C 5.9 10/26/2015   MICROALBUR 0.2 04/27/2014    Lab Results  Component Value Date   TSH 0.59 04/03/2016   Lab Results  Component Value Date   WBC 11.1 (H) 04/03/2016   HGB 15.7 (H) 04/03/2016   HCT 45.2 04/03/2016   MCV 88.1 04/03/2016   PLT 232.0 04/03/2016   Lab Results  Component Value Date   NA 140 05/16/2016   K 4.9 05/16/2016   CO2 31 05/16/2016   GLUCOSE 124 (H) 05/16/2016   BUN 15 05/16/2016   CREATININE 0.94 05/16/2016   BILITOT 0.7 04/03/2016   ALKPHOS 56 04/03/2016   AST 26 04/03/2016   ALT 25 04/03/2016   PROT 7.5 04/03/2016  ALBUMIN 4.2 04/03/2016   CALCIUM 10.2 05/16/2016   ANIONGAP 9 09/14/2014   GFR 62.20 05/16/2016   Lab Results  Component Value Date   CHOL 193  04/03/2016   Lab Results  Component Value Date   HDL 41.70 04/03/2016   Lab Results  Component Value Date   LDLCALC 112 (H) 10/26/2015   Lab Results  Component Value Date   TRIG 246.0 (H) 04/03/2016   Lab Results  Component Value Date   CHOLHDL 5 04/03/2016   Lab Results  Component Value Date   HGBA1C 5.9 10/26/2015       Assessment & Plan:   Problem List Items Addressed This Visit    Hyperlipidemia (Chronic)    Tolerating statin, encouraged heart healthy diet, avoid trans fats, minimize simple carbs and saturated fats. Increase exercise as tolerated      Relevant Medications   atenolol (TENORMIN) 25 MG tablet   atenolol (TENORMIN) 50 MG tablet   Other Relevant Orders   Lipid panel   Essential hypertension (Chronic)    Well controlled, no changes to meds. Encouraged heart healthy diet such as the DASH diet and exercise as tolerated.       Relevant Medications   atenolol (TENORMIN) 25 MG tablet   atenolol (TENORMIN) 50 MG tablet   Other Relevant Orders   CBC   Comprehensive metabolic panel   Hyperglycemia    hgba1c acceptable, minimize simple carbs. Increase exercise as tolerated.       Relevant Orders   Hemoglobin A1c   Tremor (Chronic)    Not affecting her ADLs at this time. Only in head and neck but it has been getting worse. Not affecting arms so far. Did have a great Aunt who had a tremor.      Obesity (Chronic)    Encouraged DASH diet, decrease po intake and increase exercise as tolerated. Needs 7-8 hours of sleep nightly. Avoid trans fats, eat small, frequent meals every 4-5 hours with lean proteins, complex carbs and healthy fats. Minimize simple carbs      Hypothyroidism    On Levothyroxine, continue to monitor      Relevant Medications   atenolol (TENORMIN) 25 MG tablet   atenolol (TENORMIN) 50 MG tablet   Other Relevant Orders   TSH   Cardiomegaly    Last CXR unremarkable recently. Asymptomatic, has not seen her cardiologist for several  years and declines referral back to Dr Wynonia Lawman at this time.      Relevant Medications   atenolol (TENORMIN) 25 MG tablet   atenolol (TENORMIN) 50 MG tablet   Hypoxia, sleep related    Is using oxygen qhs but has declined sleep study so far. Encouraged to consider proceeding       Other Visit Diagnoses    Encounter for Medicare annual wellness exam    -  Primary   Postmenopausal       Estrogen deficiency       Relevant Orders   DG Bone Density (Completed)      I have discontinued Ms. Pernell's HYDROcodone-homatropine. I have also changed her atenolol. Additionally, I am having her start on atenolol and ketoconazole. Lastly, I am having her maintain her Cinnamon, Fish Oil, Calcium Carbonate-Vit D-Min (CALCIUM 1200 PO), Vitamin D, Coenzyme Q10, aspirin EC, traMADol, BESIVANCE, mupirocin ointment, Glucosamine HCl (GLUCOSAMINE PO), fexofenadine-pseudoephedrine, vitamin B-12, clotrimazole, ranitidine, omeprazole, budesonide-formoterol, albuterol, levothyroxine, gabapentin, hydrochlorothiazide, citalopram, and rosuvastatin.  Meds ordered this encounter  Medications  . atenolol (TENORMIN) 25 MG tablet  Sig: Take 1 tablet (25 mg total) by mouth at bedtime. Take the 50 mg Atenolol in am and the 25 mg in pm    Dispense:  90 tablet    Refill:  3  . atenolol (TENORMIN) 50 MG tablet    Sig: Take 1 tablet (50 mg total) by mouth every morning.    Dispense:  90 tablet    Refill:  1  . ketoconazole (NIZORAL) 2 % shampoo    Sig: Apply 1 application topically 2 (two) times a week.    Dispense:  120 mL    Refill:  03    Penni Homans, MD

## 2016-10-21 NOTE — Assessment & Plan Note (Addendum)
Not affecting her ADLs at this time. Only in head and neck but it has been getting worse. Not affecting arms so far. Did have a great Aunt who had a tremor.

## 2016-10-21 NOTE — Assessment & Plan Note (Signed)
Is using oxygen qhs but has declined sleep study so far. Encouraged to consider proceeding

## 2016-10-21 NOTE — Assessment & Plan Note (Signed)
Tolerating statin, encouraged heart healthy diet, avoid trans fats, minimize simple carbs and saturated fats. Increase exercise as tolerated 

## 2016-10-21 NOTE — Assessment & Plan Note (Signed)
Well controlled, no changes to meds. Encouraged heart healthy diet such as the DASH diet and exercise as tolerated.

## 2016-10-21 NOTE — Assessment & Plan Note (Signed)
hgba1c acceptable, minimize simple carbs. Increase exercise as tolerated.  

## 2016-10-21 NOTE — Assessment & Plan Note (Signed)
On Levothyroxine, continue to monitor 

## 2016-10-21 NOTE — Assessment & Plan Note (Signed)
Encouraged DASH diet, decrease po intake and increase exercise as tolerated. Needs 7-8 hours of sleep nightly. Avoid trans fats, eat small, frequent meals every 4-5 hours with lean proteins, complex carbs and healthy fats. Minimize simple carbs 

## 2016-10-21 NOTE — Assessment & Plan Note (Signed)
Last CXR unremarkable recently. Asymptomatic, has not seen her cardiologist for several years and declines referral back to Dr Wynonia Lawman at this time.

## 2016-10-21 NOTE — Patient Instructions (Addendum)
Follow up with Dr.Blyth today as scheduled.  Eat heart healthy diet (full of fruits, vegetables, whole grains, lean protein, water--limit salt, fat, and sugar intake) and increase physical activity as tolerated.  Continue doing brain stimulating activities (puzzles, reading, adult coloring books, staying active) to keep memory sharp.  Hypertension Hypertension, commonly called high blood pressure, is when the force of blood pumping through the arteries is too strong. The arteries are the blood vessels that carry blood from the heart throughout the body. Hypertension forces the heart to work harder to pump blood and may cause arteries to become narrow or stiff. Having untreated or uncontrolled hypertension can cause heart attacks, strokes, kidney disease, and other problems. A blood pressure reading consists of a higher number over a lower number. Ideally, your blood pressure should be below 120/80. The first ("top") number is called the systolic pressure. It is a measure of the pressure in your arteries as your heart beats. The second ("bottom") number is called the diastolic pressure. It is a measure of the pressure in your arteries as the heart relaxes. What are the causes? The cause of this condition is not known. What increases the risk? Some risk factors for high blood pressure are under your control. Others are not. Factors you can change   Smoking.  Having type 2 diabetes mellitus, high cholesterol, or both.  Not getting enough exercise or physical activity.  Being overweight.  Having too much fat, sugar, calories, or salt (sodium) in your diet.  Drinking too much alcohol. Factors that are difficult or impossible to change   Having chronic kidney disease.  Having a family history of high blood pressure.  Age. Risk increases with age.  Race. You may be at higher risk if you are African-American.  Gender. Men are at higher risk than women before age 30. After age 43, women are  at higher risk than men.  Having obstructive sleep apnea.  Stress. What are the signs or symptoms? Extremely high blood pressure (hypertensive crisis) may cause:  Headache.  Anxiety.  Shortness of breath.  Nosebleed.  Nausea and vomiting.  Severe chest pain.  Jerky movements you cannot control (seizures). How is this diagnosed? This condition is diagnosed by measuring your blood pressure while you are seated, with your arm resting on a surface. The cuff of the blood pressure monitor will be placed directly against the skin of your upper arm at the level of your heart. It should be measured at least twice using the same arm. Certain conditions can cause a difference in blood pressure between your right and left arms. Certain factors can cause blood pressure readings to be lower or higher than normal (elevated) for a short period of time:  When your blood pressure is higher when you are in a health care provider's office than when you are at home, this is called white coat hypertension. Most people with this condition do not need medicines.  When your blood pressure is higher at home than when you are in a health care provider's office, this is called masked hypertension. Most people with this condition may need medicines to control blood pressure. If you have a high blood pressure reading during one visit or you have normal blood pressure with other risk factors:  You may be asked to return on a different day to have your blood pressure checked again.  You may be asked to monitor your blood pressure at home for 1 week or longer. If you are diagnosed  with hypertension, you may have other blood or imaging tests to help your health care provider understand your overall risk for other conditions. How is this treated? This condition is treated by making healthy lifestyle changes, such as eating healthy foods, exercising more, and reducing your alcohol intake. Your health care provider  may prescribe medicine if lifestyle changes are not enough to get your blood pressure under control, and if:  Your systolic blood pressure is above 130.  Your diastolic blood pressure is above 80. Your personal target blood pressure may vary depending on your medical conditions, your age, and other factors. Follow these instructions at home: Eating and drinking   Eat a diet that is high in fiber and potassium, and low in sodium, added sugar, and fat. An example eating plan is called the DASH (Dietary Approaches to Stop Hypertension) diet. To eat this way:  Eat plenty of fresh fruits and vegetables. Try to fill half of your plate at each meal with fruits and vegetables.  Eat whole grains, such as whole wheat pasta, brown rice, or whole grain bread. Fill about one quarter of your plate with whole grains.  Eat or drink low-fat dairy products, such as skim milk or low-fat yogurt.  Avoid fatty cuts of meat, processed or cured meats, and poultry with skin. Fill about one quarter of your plate with lean proteins, such as fish, chicken without skin, beans, eggs, and tofu.  Avoid premade and processed foods. These tend to be higher in sodium, added sugar, and fat.  Reduce your daily sodium intake. Most people with hypertension should eat less than 1,500 mg of sodium a day.  Limit alcohol intake to no more than 1 drink a day for nonpregnant women and 2 drinks a day for men. One drink equals 12 oz of beer, 5 oz of wine, or 1 oz of hard liquor. Lifestyle   Work with your health care provider to maintain a healthy body weight or to lose weight. Ask what an ideal weight is for you.  Get at least 30 minutes of exercise that causes your heart to beat faster (aerobic exercise) most days of the week. Activities may include walking, swimming, or biking.  Include exercise to strengthen your muscles (resistance exercise), such as pilates or lifting weights, as part of your weekly exercise routine. Try to  do these types of exercises for 30 minutes at least 3 days a week.  Do not use any products that contain nicotine or tobacco, such as cigarettes and e-cigarettes. If you need help quitting, ask your health care provider.  Monitor your blood pressure at home as told by your health care provider.  Keep all follow-up visits as told by your health care provider. This is important. Medicines   Take over-the-counter and prescription medicines only as told by your health care provider. Follow directions carefully. Blood pressure medicines must be taken as prescribed.  Do not skip doses of blood pressure medicine. Doing this puts you at risk for problems and can make the medicine less effective.  Ask your health care provider about side effects or reactions to medicines that you should watch for. Contact a health care provider if:  You think you are having a reaction to a medicine you are taking.  You have headaches that keep coming back (recurring).  You feel dizzy.  You have swelling in your ankles.  You have trouble with your vision. Get help right away if:  You develop a severe headache or confusion.  You have unusual weakness or numbness.  You feel faint.  You have severe pain in your chest or abdomen.  You vomit repeatedly.  You have trouble breathing. Summary  Hypertension is when the force of blood pumping through your arteries is too strong. If this condition is not controlled, it may put you at risk for serious complications.  Your personal target blood pressure may vary depending on your medical conditions, your age, and other factors. For most people, a normal blood pressure is less than 120/80.  Hypertension is treated with lifestyle changes, medicines, or a combination of both. Lifestyle changes include weight loss, eating a healthy, low-sodium diet, exercising more, and limiting alcohol. This information is not intended to replace advice given to you by your health  care provider. Make sure you discuss any questions you have with your health care provider. Document Released: 07/21/2005 Document Revised: 06/18/2016 Document Reviewed: 06/18/2016 Elsevier Interactive Patient Education  2017 [employer].

## 2016-10-23 ENCOUNTER — Other Ambulatory Visit: Payer: Self-pay | Admitting: Family Medicine

## 2016-10-23 MED ORDER — ROSUVASTATIN CALCIUM 20 MG PO TABS: 20.0000 mg | ORAL_TABLET | Freq: Every day | ORAL | 2 refills | Status: DC

## 2016-10-28 ENCOUNTER — Encounter (INDEPENDENT_AMBULATORY_CARE_PROVIDER_SITE_OTHER): Payer: Medicare Other | Admitting: Ophthalmology

## 2016-10-28 DIAGNOSIS — H43813 Vitreous degeneration, bilateral: Secondary | ICD-10-CM

## 2016-10-28 DIAGNOSIS — I10 Essential (primary) hypertension: Secondary | ICD-10-CM

## 2016-10-28 DIAGNOSIS — H35033 Hypertensive retinopathy, bilateral: Secondary | ICD-10-CM | POA: Diagnosis not present

## 2016-10-28 DIAGNOSIS — H34832 Tributary (branch) retinal vein occlusion, left eye, with macular edema: Secondary | ICD-10-CM

## 2016-11-05 DIAGNOSIS — Z961 Presence of intraocular lens: Secondary | ICD-10-CM | POA: Diagnosis not present

## 2016-11-05 DIAGNOSIS — H348322 Tributary (branch) retinal vein occlusion, left eye, stable: Secondary | ICD-10-CM | POA: Diagnosis not present

## 2016-11-05 DIAGNOSIS — H353131 Nonexudative age-related macular degeneration, bilateral, early dry stage: Secondary | ICD-10-CM | POA: Diagnosis not present

## 2016-12-06 ENCOUNTER — Other Ambulatory Visit: Payer: Self-pay | Admitting: Family Medicine

## 2016-12-16 ENCOUNTER — Encounter (INDEPENDENT_AMBULATORY_CARE_PROVIDER_SITE_OTHER): Payer: Medicare Other | Admitting: Ophthalmology

## 2016-12-16 DIAGNOSIS — H34832 Tributary (branch) retinal vein occlusion, left eye, with macular edema: Secondary | ICD-10-CM | POA: Diagnosis not present

## 2016-12-16 DIAGNOSIS — H43813 Vitreous degeneration, bilateral: Secondary | ICD-10-CM | POA: Diagnosis not present

## 2016-12-16 DIAGNOSIS — H35033 Hypertensive retinopathy, bilateral: Secondary | ICD-10-CM | POA: Diagnosis not present

## 2016-12-16 DIAGNOSIS — I10 Essential (primary) hypertension: Secondary | ICD-10-CM | POA: Diagnosis not present

## 2017-01-18 ENCOUNTER — Other Ambulatory Visit: Payer: Self-pay | Admitting: Family Medicine

## 2017-01-22 ENCOUNTER — Ambulatory Visit: Payer: Medicare Other | Admitting: Family Medicine

## 2017-01-27 ENCOUNTER — Encounter (INDEPENDENT_AMBULATORY_CARE_PROVIDER_SITE_OTHER): Payer: Medicare Other | Admitting: Ophthalmology

## 2017-01-27 DIAGNOSIS — H43813 Vitreous degeneration, bilateral: Secondary | ICD-10-CM | POA: Diagnosis not present

## 2017-01-27 DIAGNOSIS — H35033 Hypertensive retinopathy, bilateral: Secondary | ICD-10-CM

## 2017-01-27 DIAGNOSIS — I10 Essential (primary) hypertension: Secondary | ICD-10-CM

## 2017-01-27 DIAGNOSIS — H34832 Tributary (branch) retinal vein occlusion, left eye, with macular edema: Secondary | ICD-10-CM | POA: Diagnosis not present

## 2017-02-17 ENCOUNTER — Encounter: Payer: Self-pay | Admitting: Family Medicine

## 2017-02-17 ENCOUNTER — Ambulatory Visit (INDEPENDENT_AMBULATORY_CARE_PROVIDER_SITE_OTHER): Payer: Medicare Other | Admitting: Family Medicine

## 2017-02-17 DIAGNOSIS — K219 Gastro-esophageal reflux disease without esophagitis: Secondary | ICD-10-CM

## 2017-02-17 DIAGNOSIS — M1990 Unspecified osteoarthritis, unspecified site: Secondary | ICD-10-CM

## 2017-02-17 DIAGNOSIS — E039 Hypothyroidism, unspecified: Secondary | ICD-10-CM | POA: Diagnosis not present

## 2017-02-17 DIAGNOSIS — E785 Hyperlipidemia, unspecified: Secondary | ICD-10-CM | POA: Diagnosis not present

## 2017-02-17 DIAGNOSIS — Z6830 Body mass index (BMI) 30.0-30.9, adult: Secondary | ICD-10-CM

## 2017-02-17 DIAGNOSIS — E669 Obesity, unspecified: Secondary | ICD-10-CM | POA: Diagnosis not present

## 2017-02-17 DIAGNOSIS — R519 Headache, unspecified: Secondary | ICD-10-CM

## 2017-02-17 DIAGNOSIS — I10 Essential (primary) hypertension: Secondary | ICD-10-CM | POA: Diagnosis not present

## 2017-02-17 DIAGNOSIS — R51 Headache: Secondary | ICD-10-CM | POA: Diagnosis not present

## 2017-02-17 DIAGNOSIS — R739 Hyperglycemia, unspecified: Secondary | ICD-10-CM

## 2017-02-17 DIAGNOSIS — G894 Chronic pain syndrome: Secondary | ICD-10-CM

## 2017-02-17 DIAGNOSIS — E66811 Obesity, class 1: Secondary | ICD-10-CM

## 2017-02-17 DIAGNOSIS — R2689 Other abnormalities of gait and mobility: Secondary | ICD-10-CM | POA: Diagnosis not present

## 2017-02-17 DIAGNOSIS — J439 Emphysema, unspecified: Secondary | ICD-10-CM | POA: Diagnosis not present

## 2017-02-17 MED ORDER — GABAPENTIN 300 MG PO CAPS: 300.0000 mg | ORAL_CAPSULE | Freq: Every day | ORAL | 2 refills | Status: DC

## 2017-02-17 NOTE — Assessment & Plan Note (Signed)
Tolerating statin, encouraged heart healthy diet, avoid trans fats, minimize simple carbs and saturated fats. Increase exercise as tolerated 

## 2017-02-17 NOTE — Assessment & Plan Note (Signed)
Using supplemental oxygen with sleep and exertion. Does not feel the Symbicort is working, she felt the Spiriva worked better.

## 2017-02-17 NOTE — Assessment & Plan Note (Signed)
hgba1c acceptable, minimize simple carbs. Increase exercise as tolerated.  

## 2017-02-17 NOTE — Assessment & Plan Note (Signed)
On Levothyroxine, continue to monitor 

## 2017-02-17 NOTE — Progress Notes (Signed)
Subjective:  I acted as a Education administrator for Dr. Charlett Blake. [name], [place]  Patient ID: Kimberly Irwin, female    DOB: 1946/07/04, 71 y.o.   MRN: [id number]  No chief complaint on file.   HPI  Patient is in today for a 3 month follow up. Patient presents today with no acute concerns. No recent febrile illness or acute hospitalizations. Denies CP/palp/SOB/HA/congestion/fevers/GI or GU c/o. Taking meds as prescribed. She is frustrated with the Symbicort she does not feel it is working as well as Spiriva did.    Patient Care Team: Mosie Lukes, MD as PCP - General (Family Medicine) Earnie Larsson, MD as Consulting Physician (Neurosurgery) Paralee Cancel, MD as Consulting Physician (Orthopedic Surgery) Parrett, Fonnie Mu, NP as Nurse Practitioner (Pulmonary Disease)   Past Medical History:  Diagnosis Date  . Acute hip pain 10/26/2012  . Bronchitis 09/04/2016  . Bursitis of left hip 10/26/2012  . Cardiomegaly 07/13/2016  . Chronic low back pain   . Coronary artery calcification 07/03/2016  . DDD (degenerative disc disease)    L3-4 with facet arthropathy and stenosis  . Degenerative spondylolisthesis    L4-5 grade 1 with stenosis  . GERD (gastroesophageal reflux disease)   . Heart murmur    as an infant  . History of hiatal hernia   . Hyperlipidemia   . Hypertension   . Hypothyroidism   . Hypothyroidism 10/28/2015  . Hypoxia, sleep related 10/21/2016  . Pain in joint, lower leg 10/26/2012   left   . Pneumonia    as a child several times.   . Sarcoidosis   . SOB (shortness of breath) 04/03/2016  . Tremors of nervous system     Past Surgical History:  Procedure Laterality Date  . ANTERIOR LAT LUMBAR FUSION Left 08/29/2014   Procedure: EXTREME LEFT LATERAL INTERBODY FUSION LUMBAR TWO-THREE LATERAL PLATE;  Surgeon: Charlie Pitter, MD;  Location: [facility];  Service: Neurosurgery;  Laterality: Left;  . BACK SURGERY  01/15/09   L3-4 and L4-5 decompressive laminectomy with bilateral L3, L4, and L5  decompressive foraminotomies, more than it would be required for simple interbody fusion alone.  Marland Kitchen BACK SURGERY  01/15/09   L3-4 and L4-5 posterior lumbar interbody fusion utilizing tanget interbody allograft wedge, Telamon interbody PEEk cage, and local autografting.  Marland Kitchen BACK SURGERY  01/15/09   L3, L4, and L5 posterolateral arthrodesis using segmental pedicle screw fixation and local autografting.  Marland Kitchen CARDIAC CATHETERIZATION    . CARPAL TUNNEL RELEASE Bilateral   . COLONOSCOPY    . ESOPHAGOGASTRODUODENOSCOPY    . EYE SURGERY     lens implant  . JOINT REPLACEMENT  09/13/09   Right Hip, Dr. Alvan Dame  . KYPHOPLASTY N/A 09/14/2014   Procedure: Lumbar Two Kyphoplasty/Vertebroplasty;  Surgeon: Charlie Pitter, MD;  Location: [facility];  Service: Neurosurgery;  Laterality: N/A;  Lumbar Two Kyphoplasty/Vertebroplasty  . THYROIDECTOMY    . TOTAL HIP ARTHROPLASTY Left 06/28/2013   Procedure: LEFT TOTAL  HIP ARTHROPLASTY ANTERIOR APPROACH;  Surgeon: Mauri Pole, MD;  Location: [facility];  Service: Orthopedics;  Laterality: Left;    Family History  Problem Relation Age of Onset  . Cancer Mother 58       Breast  . Heart disease Mother   . Hypertension Mother   . Hyperlipidemia Mother   . Heart attack Mother   . Asthma Maternal Grandmother     Social History   Social History  . Marital status: Widowed  Spouse name: N/A  . Number of children: 2  . Years of education: N/A   Occupational History  . Not on file.   Social History Main Topics  . Smoking status: Former Smoker    Packs/day: 0.50    Years: 32.00    Types: Cigarettes    Quit date: 07/22/1994  . Smokeless tobacco: Never Used  . Alcohol use No  . Drug use: No  . Sexual activity: No   Other Topics Concern  . Not on file   Social History Narrative   Married 45 years and has 2 children (2 daughters)   Alcohol Use - no   Former Smoker - she quit 10 years ago, started when she was 77 and has had varying level of use of tobacco  products from 1/2 pack to 3 packs per day.    Outpatient Medications Prior to Visit  Medication Sig Dispense Refill  . albuterol (PROVENTIL HFA;VENTOLIN HFA) 108 (90 Base) MCG/ACT inhaler Inhale 2 puffs into the lungs every 6 (six) hours as needed for wheezing or shortness of breath. 1 Inhaler 6  . aspirin EC 81 MG tablet Take 81 mg by mouth daily.    Marland Kitchen atenolol (TENORMIN) 25 MG tablet Take 1 tablet (25 mg total) by mouth at bedtime. Take the 50 mg Atenolol in am and the 25 mg in pm 90 tablet 3  . atenolol (TENORMIN) 50 MG tablet Take 1 tablet (50 mg total) by mouth every morning. 90 tablet 1  . BESIVANCE 0.6 % SUSP Reported on 07/23/2015  12  . budesonide-formoterol (SYMBICORT) 160-4.5 MCG/ACT inhaler Inhale 2 puffs into the lungs 2 (two) times daily. 1 Inhaler 5  . Calcium Carbonate-Vit D-Min (CALCIUM 1200 PO) Take 1,200 mg by mouth daily.     . Cholecalciferol (VITAMIN D) 2000 UNITS tablet Take 2,000 Units by mouth daily.    . Cinnamon 500 MG capsule Take 2,000 mg by mouth daily.      . Coenzyme Q10 200 MG capsule Take 200 mg by mouth daily.    . fexofenadine-pseudoephedrine (ALLEGRA-D 24) 180-240 MG 24 hr tablet Take 1 tablet by mouth daily.    . Glucosamine HCl (GLUCOSAMINE PO) Take by mouth daily.    . hydrochlorothiazide (HYDRODIURIL) 25 MG tablet TAKE 1 TABLET (25 MG TOTAL) BY MOUTH DAILY. 90 tablet 1  . ketoconazole (NIZORAL) 2 % shampoo Apply 1 application topically 2 (two) times a week. 120 mL 03  . levothyroxine (SYNTHROID, LEVOTHROID) 112 MCG tablet TAKE 1 TABLET (112 MCG TOTAL) BY MOUTH DAILY BEFORE BREAKFAST. 90 tablet 1  . mupirocin ointment (BACTROBAN) 2 % Apply twice daily to bridge of the nose. (Patient not taking: Reported on 10/21/2016) 22 g 0  . Omega-3 Fatty Acids (FISH OIL) 1200 MG CAPS Take 2,400 mg by mouth daily.     Marland Kitchen omeprazole (PRILOSEC) 40 MG capsule TAKE 1 CAPSULE (40 MG TOTAL) BY MOUTH DAILY. 90 capsule 1  . ranitidine (ZANTAC) 150 MG tablet Take 1-2 tablets  (150-300 mg total) by mouth at bedtime as needed for heartburn. 60 tablet 2  . rosuvastatin (CRESTOR) 20 MG tablet Take 1 tablet (20 mg total) by mouth daily. 90 tablet 2  . traMADol (ULTRAM) 50 MG tablet Take 2 tablets (100 mg total) by mouth every 6 (six) hours as needed for moderate pain or severe pain. 120 tablet 0  . vitamin B-12 (CYANOCOBALAMIN) 1000 MCG tablet Take 1,000 mcg by mouth daily.    . citalopram (CELEXA) 10 MG  tablet TAKE 1 TABLET (10 MG TOTAL) BY MOUTH DAILY. 90 tablet 1  . clotrimazole (CLOTRIMAZOLE AF) 1 % cream Apply 1 application topically 2 (two) times daily. (Patient not taking: Reported on 10/21/2016) 113 g 1  . gabapentin (NEURONTIN) 300 MG capsule TAKE 1 CAPSULE (300 MG TOTAL) BY MOUTH AT BEDTIME. 90 capsule 2   No facility-administered medications prior to visit.     Allergies  Allergen Reactions  . Lipitor [Atorvastatin] Swelling  . Metronidazole Swelling  . Penicillins Swelling  . Pregabalin Other (See Comments)    REACTION: Somnolence and dizziness  . Simvastatin Other (See Comments)    Leg pain    Review of Systems  Constitutional: Negative for fever and malaise/fatigue.  HENT: Negative for congestion.   Eyes: Negative for blurred vision.  Respiratory: Positive for cough and shortness of breath.   Cardiovascular: Negative for chest pain, palpitations and leg swelling.  Gastrointestinal: Negative for vomiting.  Musculoskeletal: Negative for back pain.  Skin: Negative for rash.  Neurological: Negative for loss of consciousness and headaches.       Objective:    Physical Exam  Constitutional: She is oriented to person, place, and time. She appears well-developed and well-nourished. No distress.  HENT:  Head: Normocephalic and atraumatic.  Eyes: Conjunctivae are normal.  Neck: Normal range of motion. No thyromegaly present.  Cardiovascular: Normal rate and regular rhythm.   Pulmonary/Chest: Effort normal and breath sounds normal. She has no  wheezes.  Abdominal: Soft. Bowel sounds are normal. There is no tenderness.  Musculoskeletal: Normal range of motion. She exhibits no edema or deformity.  Neurological: She is alert and oriented to person, place, and time.  Skin: Skin is warm and dry. She is not diaphoretic.  Psychiatric: She has a normal mood and affect.    BP 120/82 (BP Location: Left Arm, Patient Position: Sitting, Cuff Size: Normal)   Pulse 62   Temp 98.2 F (36.8 C) (Oral)   Resp 18   Wt 173 lb 6.4 oz (78.7 kg)   SpO2 (!) 85%   BMI 29.76 kg/m  Wt Readings from Last 3 Encounters:  02/17/17 173 lb 6.4 oz (78.7 kg)  10/21/16 178 lb 12.8 oz (81.1 kg)  10/01/16 176 lb 3.2 oz (79.9 kg)   BP Readings from Last 3 Encounters:  02/17/17 120/82  10/21/16 126/68  10/01/16 122/80     Immunization History  Administered Date(s) Administered  . Influenza Split 05/08/2011, 05/26/2012  . Influenza Whole 06/03/2006, 05/31/2007, 04/26/2008, 05/23/2009, 05/20/2010  . Influenza, High Dose Seasonal PF 05/23/2013, 04/03/2016  . Influenza,inj,Quad PF,36+ Mos 04/27/2014, 06/15/2015  . Pneumococcal Conjugate-13 04/27/2014  . Pneumococcal Polysaccharide-23 06/18/2011, 07/03/2016  . Tdap 06/18/2011    Health Maintenance  Topic Date Due  . INFLUENZA VACCINE  03/04/2017  . MAMMOGRAM  05/23/2018  . Fecal DNA (Cologuard)  07/11/2018  . TETANUS/TDAP  06/17/2021  . DEXA SCAN  Completed  . Hepatitis C Screening  Completed  . PNA vac Low Risk Adult  Completed    Lab Results  Component Value Date   WBC 9.5 10/21/2016   HGB 15.0 10/21/2016   HCT 43.6 10/21/2016   PLT 221.0 10/21/2016   GLUCOSE 107 (H) 10/21/2016   CHOL 181 10/21/2016   TRIG 226.0 (H) 10/21/2016   HDL 44.20 10/21/2016   LDLDIRECT 110.0 10/21/2016   LDLCALC 112 (H) 10/26/2015   ALT 18 10/21/2016   AST 20 10/21/2016   NA 139 10/21/2016   K 4.2 10/21/2016   CL  102 10/21/2016   CREATININE 0.82 10/21/2016   BUN 17 10/21/2016   CO2 31 10/21/2016   TSH  0.28 (L) 10/21/2016   INR 1.06 06/23/2013   HGBA1C 6.1 10/21/2016   MICROALBUR 0.2 04/27/2014    Lab Results  Component Value Date   TSH 0.28 (L) 10/21/2016   Lab Results  Component Value Date   WBC 9.5 10/21/2016   HGB 15.0 10/21/2016   HCT 43.6 10/21/2016   MCV 89.2 10/21/2016   PLT 221.0 10/21/2016   Lab Results  Component Value Date   NA 139 10/21/2016   K 4.2 10/21/2016   CO2 31 10/21/2016   GLUCOSE 107 (H) 10/21/2016   BUN 17 10/21/2016   CREATININE 0.82 10/21/2016   BILITOT 0.7 10/21/2016   ALKPHOS 52 10/21/2016   AST 20 10/21/2016   ALT 18 10/21/2016   PROT 7.2 10/21/2016   ALBUMIN 4.1 10/21/2016   CALCIUM 10.0 10/21/2016   ANIONGAP 9 09/14/2014   GFR 72.73 10/21/2016   Lab Results  Component Value Date   CHOL 181 10/21/2016   Lab Results  Component Value Date   HDL 44.20 10/21/2016   Lab Results  Component Value Date   LDLCALC 112 (H) 10/26/2015   Lab Results  Component Value Date   TRIG 226.0 (H) 10/21/2016   Lab Results  Component Value Date   CHOLHDL 4 10/21/2016   Lab Results  Component Value Date   HGBA1C 6.1 10/21/2016         Assessment & Plan:   Problem List Items Addressed This Visit    Hyperlipidemia (Chronic)    Tolerating statin, encouraged heart healthy diet, avoid trans fats, minimize simple carbs and saturated fats. Increase exercise as tolerated      Relevant Orders   Lipid panel   Essential hypertension (Chronic)    Well controlled, no changes to meds. Encouraged heart healthy diet such as the DASH diet and exercise as tolerated.       Relevant Orders   CBC   Comprehensive metabolic panel   TSH   Hyperglycemia    hgba1c acceptable, minimize simple carbs. Increase exercise as tolerated.       Relevant Orders   Hemoglobin A1c   GERD (Chronic)    Avoid offending foods, start probiotics. Do not eat large meals in late evening and consider raising head of bed.       Obesity (Chronic)    Encouraged DASH  diet, decrease po intake and increase exercise as tolerated. Needs 7-8 hours of sleep nightly. Avoid trans fats, eat small, frequent meals every 4-5 hours with lean proteins, complex carbs and healthy fats. Minimize simple carbs      Chronic pain syndrome   Hypothyroidism    On Levothyroxine, continue to monitor      COPD (chronic obstructive pulmonary disease) (HCC)    Using supplemental oxygen with sleep and exertion. Does not feel the Symbicort is working, she felt the Spiriva worked better.        Other Visit Diagnoses    Arthritis       Nonintractable headache, unspecified chronicity pattern, unspecified headache type       Relevant Medications   gabapentin (NEURONTIN) 300 MG capsule   Poor balance          I have discontinued Ms. Rosasco's clotrimazole, gabapentin, and citalopram. I am also having her start on gabapentin. Additionally, I am having her maintain her Cinnamon, Fish Oil, Calcium Carbonate-Vit D-Min (CALCIUM 1200 PO), Vitamin  D, Coenzyme Q10, aspirin EC, traMADol, BESIVANCE, mupirocin ointment, Glucosamine HCl (GLUCOSAMINE PO), fexofenadine-pseudoephedrine, vitamin B-12, ranitidine, budesonide-formoterol, albuterol, hydrochlorothiazide, atenolol, atenolol, ketoconazole, rosuvastatin, omeprazole, and levothyroxine.  Meds ordered this encounter  Medications  . gabapentin (NEURONTIN) 300 MG capsule    Sig: Take 1 capsule (300 mg total) by mouth at bedtime.    Dispense:  90 capsule    Refill:  2    CMA served as scribe during this visit. History, Physical and Plan performed by medical provider. Documentation and orders reviewed and attested to.  Penni Homans, MD

## 2017-02-17 NOTE — Patient Instructions (Signed)

## 2017-02-18 NOTE — Assessment & Plan Note (Signed)
Encouraged DASH diet, decrease po intake and increase exercise as tolerated. Needs 7-8 hours of sleep nightly. Avoid trans fats, eat small, frequent meals every 4-5 hours with lean proteins, complex carbs and healthy fats. Minimize simple carbs 

## 2017-02-18 NOTE — Assessment & Plan Note (Signed)
Well controlled, no changes to meds. Encouraged heart healthy diet such as the DASH diet and exercise as tolerated.

## 2017-02-18 NOTE — Assessment & Plan Note (Signed)
Avoid offending foods, start probiotics. Do not eat large meals in late evening and consider raising head of bed.  

## 2017-02-23 ENCOUNTER — Other Ambulatory Visit: Payer: Self-pay | Admitting: Family Medicine

## 2017-03-03 ENCOUNTER — Other Ambulatory Visit (INDEPENDENT_AMBULATORY_CARE_PROVIDER_SITE_OTHER): Payer: Medicare Other

## 2017-03-03 DIAGNOSIS — R739 Hyperglycemia, unspecified: Secondary | ICD-10-CM

## 2017-03-03 DIAGNOSIS — E785 Hyperlipidemia, unspecified: Secondary | ICD-10-CM

## 2017-03-03 DIAGNOSIS — I10 Essential (primary) hypertension: Secondary | ICD-10-CM

## 2017-03-03 LAB — COMPREHENSIVE METABOLIC PANEL
ALBUMIN: 4 g/dL (ref 3.5–5.2)
ALT: 18 U/L (ref 0–35)
AST: 21 U/L (ref 0–37)
Alkaline Phosphatase: 48 U/L (ref 39–117)
BUN: 12 mg/dL (ref 6–23)
CHLORIDE: 102 meq/L (ref 96–112)
CO2: 26 mEq/L (ref 19–32)
CREATININE: 0.79 mg/dL (ref 0.40–1.20)
Calcium: 9.4 mg/dL (ref 8.4–10.5)
GFR: 75.84 mL/min (ref >60.00)
GLUCOSE: 157 mg/dL — AB (ref 70–99)
Potassium: 3.8 mEq/L (ref 3.5–5.1)
Sodium: 138 mEq/L (ref 135–145)
Total Bilirubin: 0.9 mg/dL (ref 0.2–1.2)
Total Protein: 7 g/dL (ref 6.0–8.3)

## 2017-03-03 LAB — HEMOGLOBIN A1C: Hgb A1c MFr Bld: 5.7 % (ref 4.6–6.5)

## 2017-03-03 LAB — LIPID PANEL
Cholesterol: 151 mg/dL (ref 0–200)
HDL: 37 mg/dL — AB (ref >39.00)
NonHDL: 114.01
Total CHOL/HDL Ratio: 4
Triglycerides: 231 mg/dL — ABNORMAL HIGH (ref 0.0–149.0)
VLDL: 46.2 mg/dL — AB (ref 0.0–40.0)

## 2017-03-03 LAB — CBC
HCT: 44.8 % (ref 36.0–46.0)
Hemoglobin: 15.1 g/dL — ABNORMAL HIGH (ref 12.0–15.0)
MCHC: 33.7 g/dL (ref 30.0–36.0)
MCV: 90.1 fl (ref 78.0–100.0)
Platelets: 190 10*3/uL (ref 150.0–400.0)
RBC: 4.97 Mil/uL (ref 3.87–5.11)
RDW: 13.6 % (ref 11.5–15.5)
WBC: 8.7 10*3/uL (ref 4.0–10.5)

## 2017-03-03 LAB — TSH: TSH: 0.45 u[IU]/mL (ref 0.35–4.50)

## 2017-03-03 LAB — LDL CHOLESTEROL, DIRECT: Direct LDL: 84 mg/dL

## 2017-03-17 ENCOUNTER — Encounter (INDEPENDENT_AMBULATORY_CARE_PROVIDER_SITE_OTHER): Payer: Medicare Other | Admitting: Ophthalmology

## 2017-03-17 DIAGNOSIS — H35033 Hypertensive retinopathy, bilateral: Secondary | ICD-10-CM | POA: Diagnosis not present

## 2017-03-17 DIAGNOSIS — I10 Essential (primary) hypertension: Secondary | ICD-10-CM | POA: Diagnosis not present

## 2017-03-17 DIAGNOSIS — H34832 Tributary (branch) retinal vein occlusion, left eye, with macular edema: Secondary | ICD-10-CM

## 2017-03-17 DIAGNOSIS — H43813 Vitreous degeneration, bilateral: Secondary | ICD-10-CM

## 2017-03-24 ENCOUNTER — Encounter: Payer: Self-pay | Admitting: Family Medicine

## 2017-03-24 ENCOUNTER — Ambulatory Visit (HOSPITAL_BASED_OUTPATIENT_CLINIC_OR_DEPARTMENT_OTHER)
Admission: RE | Admit: 2017-03-24 | Discharge: 2017-03-24 | Disposition: A | Discharge status: Home / Self Care | Payer: Medicare Other | Source: Ambulatory Visit | Attending: Family Medicine | Admitting: Family Medicine

## 2017-03-24 ENCOUNTER — Other Ambulatory Visit: Payer: Self-pay | Admitting: Family Medicine

## 2017-03-24 ENCOUNTER — Ambulatory Visit (INDEPENDENT_AMBULATORY_CARE_PROVIDER_SITE_OTHER): Payer: Medicare Other | Admitting: Family Medicine

## 2017-03-24 VITALS — BP 112/80 | HR 64 | Temp 98.2°F | Ht 63.5 in | Wt 175.0 lb

## 2017-03-24 DIAGNOSIS — Z6830 Body mass index (BMI) 30.0-30.9, adult: Secondary | ICD-10-CM

## 2017-03-24 DIAGNOSIS — J441 Chronic obstructive pulmonary disease with (acute) exacerbation: Secondary | ICD-10-CM | POA: Diagnosis not present

## 2017-03-24 DIAGNOSIS — R05 Cough: Secondary | ICD-10-CM | POA: Diagnosis not present

## 2017-03-24 DIAGNOSIS — R0602 Shortness of breath: Secondary | ICD-10-CM | POA: Diagnosis not present

## 2017-03-24 DIAGNOSIS — E669 Obesity, unspecified: Secondary | ICD-10-CM

## 2017-03-24 DIAGNOSIS — I517 Cardiomegaly: Secondary | ICD-10-CM | POA: Insufficient documentation

## 2017-03-24 LAB — COMPREHENSIVE METABOLIC PANEL
ALK PHOS: 50 U/L (ref 39–117)
ALT: 20 U/L (ref 0–35)
AST: 25 U/L (ref 0–37)
Albumin: 4 g/dL (ref 3.5–5.2)
BILIRUBIN TOTAL: 0.8 mg/dL (ref 0.2–1.2)
BUN: 17 mg/dL (ref 6–23)
CALCIUM: 10 mg/dL (ref 8.4–10.5)
CO2: 29 mEq/L (ref 19–32)
CREATININE: 0.9 mg/dL (ref 0.40–1.20)
Chloride: 103 mEq/L (ref 96–112)
GFR: 65.24 mL/min (ref >60.00)
GLUCOSE: 99 mg/dL (ref 70–99)
Potassium: 4 mEq/L (ref 3.5–5.1)
Sodium: 139 mEq/L (ref 135–145)
TOTAL PROTEIN: 7.6 g/dL (ref 6.0–8.3)

## 2017-03-24 LAB — CBC WITH DIFFERENTIAL/PLATELET
BASOS ABS: 0.1 10*3/uL (ref 0.0–0.1)
Basophils Relative: 1 % (ref 0.0–3.0)
EOS ABS: 0.2 10*3/uL (ref 0.0–0.7)
Eosinophils Relative: 2.4 % (ref 0.0–5.0)
HEMATOCRIT: 45.5 % (ref 36.0–46.0)
Hemoglobin: 15.2 g/dL — ABNORMAL HIGH (ref 12.0–15.0)
LYMPHS PCT: 25 % (ref 12.0–46.0)
Lymphs Abs: 2.6 10*3/uL (ref 0.7–4.0)
MCHC: 33.4 g/dL (ref 30.0–36.0)
MCV: 90 fl (ref 78.0–100.0)
MONOS PCT: 9.3 % (ref 3.0–12.0)
Monocytes Absolute: 1 10*3/uL (ref 0.1–1.0)
NEUTROS PCT: 62.3 % (ref 43.0–77.0)
Neutro Abs: 6.4 10*3/uL (ref 1.4–7.7)
Platelets: 199 10*3/uL (ref 150.0–400.0)
RBC: 5.06 Mil/uL (ref 3.87–5.11)
RDW: 14.2 % (ref 11.5–15.5)
WBC: 10.3 10*3/uL (ref 4.0–10.5)

## 2017-03-24 MED ORDER — METHYLPREDNISOLONE 4 MG PO TABS: ORAL_TABLET | ORAL | 0 refills | Status: DC

## 2017-03-24 MED ORDER — ALBUTEROL SULFATE (2.5 MG/3ML) 0.083% IN NEBU
2.5000 mg | INHALATION_SOLUTION | Freq: Four times a day (QID) | RESPIRATORY_TRACT | 4 refills | Status: DC | PRN
Start: 2017-03-24 — End: 2017-03-25

## 2017-03-24 MED ORDER — CITALOPRAM HYDROBROMIDE 10 MG PO TABS
ORAL_TABLET | ORAL | 1 refills | Status: DC
Start: 2017-03-24 — End: 2017-09-15

## 2017-03-24 MED ORDER — DOXYCYCLINE HYCLATE 100 MG PO TABS: 100.0000 mg | ORAL_TABLET | Freq: Two times a day (BID) | ORAL | 0 refills | Status: DC

## 2017-03-24 MED ORDER — ALBUTEROL SULFATE (2.5 MG/3ML) 0.083% IN NEBU
2.5000 mg | INHALATION_SOLUTION | Freq: Once | RESPIRATORY_TRACT | Status: AC
  Administered 2017-03-24: 2.5 mg via RESPIRATORY_TRACT

## 2017-03-24 NOTE — Telephone Encounter (Signed)
Relation to TU:UEKC Call back number:[phone] Pharmacy: [id number] [place], [place] [phone](Phone) [phone](Fax)    Reason for call:  Patient states she was never given a rx for albuterol (PROVENTIL) (2.5 MG/3ML) 0.083% nebulizer solution, requesting Rx sent today, patient was seen today, please advise

## 2017-03-24 NOTE — Progress Notes (Signed)
Subjective:  I acted as a Education administrator for Dr. Charlett Blake. Kimberly Irwin, [place]  Patient ID: Kimberly Irwin, female    DOB: 05-24-1946, 71 y.o.   MRN: [id number]  Chief Complaint  Patient presents with  . Cough    Patient is here today C/O of cough that started on 8.18.18.  She has been using Albuterol and Symbicort and feels no better after taking them.  Sleeps with O2 at night.  She also states that her family has told her that they can tell a big difference in her since stopping the depression medication and she would like to restart that.    HPI  Patient is in today for an acute visit for a cough. Her cough has been flared for several weeks and will not resolve. She is noting increased shortness of breath. No fevers or chills. No recent hospitalizations. cough productive at times with green phlegm. Denies CP/palp/SOB/HAfevers/GI or GU c/o. Taking meds as prescribed  Patient Care Team: Mosie Lukes, MD as PCP - General (Family Medicine) Earnie Larsson, MD as Consulting Physician (Neurosurgery) Paralee Cancel, MD as Consulting Physician (Orthopedic Surgery) Parrett, Fonnie Mu, NP as Nurse Practitioner (Pulmonary Disease)   Past Medical History:  Diagnosis Date  . Acute hip pain 10/26/2012  . Bronchitis 09/04/2016  . Bursitis of left hip 10/26/2012  . Cardiomegaly 07/13/2016  . Chronic low back pain   . Coronary artery calcification 07/03/2016  . DDD (degenerative disc disease)    L3-4 with facet arthropathy and stenosis  . Degenerative spondylolisthesis    L4-5 grade 1 with stenosis  . GERD (gastroesophageal reflux disease)   . Heart murmur    as an infant  . History of hiatal hernia   . Hyperlipidemia   . Hypertension   . Hypothyroidism   . Hypothyroidism 10/28/2015  . Hypoxia, sleep related 10/21/2016  . Pain in joint, lower leg 10/26/2012   left   . Pneumonia    as a child several times.   . Sarcoidosis   . SOB (shortness of breath) 04/03/2016  . Tremors of nervous system     Past  Surgical History:  Procedure Laterality Date  . ANTERIOR LAT LUMBAR FUSION Left 08/29/2014   Procedure: EXTREME LEFT LATERAL INTERBODY FUSION LUMBAR TWO-THREE LATERAL PLATE;  Surgeon: Charlie Pitter, MD;  Location: [facility];  Service: Neurosurgery;  Laterality: Left;  . BACK SURGERY  01/15/09   L3-4 and L4-5 decompressive laminectomy with bilateral L3, L4, and L5 decompressive foraminotomies, more than it would be required for simple interbody fusion alone.  Marland Kitchen BACK SURGERY  01/15/09   L3-4 and L4-5 posterior lumbar interbody fusion utilizing tanget interbody allograft wedge, Telamon interbody PEEk cage, and local autografting.  Marland Kitchen BACK SURGERY  01/15/09   L3, L4, and L5 posterolateral arthrodesis using segmental pedicle screw fixation and local autografting.  Marland Kitchen CARDIAC CATHETERIZATION    . CARPAL TUNNEL RELEASE Bilateral   . COLONOSCOPY    . ESOPHAGOGASTRODUODENOSCOPY    . EYE SURGERY     lens implant  . JOINT REPLACEMENT  09/13/09   Right Hip, Dr. Alvan Dame  . KYPHOPLASTY N/A 09/14/2014   Procedure: Lumbar Two Kyphoplasty/Vertebroplasty;  Surgeon: Charlie Pitter, MD;  Location: [facility];  Service: Neurosurgery;  Laterality: N/A;  Lumbar Two Kyphoplasty/Vertebroplasty  . THYROIDECTOMY    . TOTAL HIP ARTHROPLASTY Left 06/28/2013   Procedure: LEFT TOTAL  HIP ARTHROPLASTY ANTERIOR APPROACH;  Surgeon: Mauri Pole, MD;  Location: [facility];  Service:  Orthopedics;  Laterality: Left;    Family History  Problem Relation Age of Onset  . Cancer Mother 22       Breast  . Heart disease Mother   . Hypertension Mother   . Hyperlipidemia Mother   . Heart attack Mother   . Asthma Maternal Grandmother     Social History   Social History  . Marital status: Widowed    Spouse name: N/A  . Number of children: 2  . Years of education: N/A   Occupational History  . Not on file.   Social History Main Topics  . Smoking status: Former Smoker    Packs/day: 0.50    Years: 32.00    Types: Cigarettes     Quit date: 07/22/1994  . Smokeless tobacco: Never Used  . Alcohol use No  . Drug use: No  . Sexual activity: No   Other Topics Concern  . Not on file   Social History Narrative   Married 16 years and has 2 children (2 daughters)   Alcohol Use - no   Former Smoker - she quit 10 years ago, started when she was 69 and has had varying level of use of tobacco products from 1/2 pack to 3 packs per day.    Outpatient Medications Prior to Visit  Medication Sig Dispense Refill  . albuterol (PROVENTIL HFA;VENTOLIN HFA) 108 (90 Base) MCG/ACT inhaler Inhale 2 puffs into the lungs every 6 (six) hours as needed for wheezing or shortness of breath. 1 Inhaler 6  . aspirin EC 81 MG tablet Take 81 mg by mouth daily.    Marland Kitchen atenolol (TENORMIN) 25 MG tablet Take 1 tablet (25 mg total) by mouth at bedtime. Take the 50 mg Atenolol in am and the 25 mg in pm 90 tablet 3  . atenolol (TENORMIN) 50 MG tablet TAKE 1 TABLET (50 MG TOTAL) BY MOUTH DAILY. 90 tablet 1  . BESIVANCE 0.6 % SUSP Reported on 07/23/2015  12  . budesonide-formoterol (SYMBICORT) 160-4.5 MCG/ACT inhaler Inhale 2 puffs into the lungs 2 (two) times daily. 1 Inhaler 5  . Calcium Carbonate-Vit D-Min (CALCIUM 1200 PO) Take 1,200 mg by mouth daily.     . Cholecalciferol (VITAMIN D) 2000 UNITS tablet Take 2,000 Units by mouth daily.    . Cinnamon 500 MG capsule Take 2,000 mg by mouth daily.      . Coenzyme Q10 200 MG capsule Take 200 mg by mouth daily.    . fexofenadine-pseudoephedrine (ALLEGRA-D 24) 180-240 MG 24 hr tablet Take 1 tablet by mouth daily.    Marland Kitchen gabapentin (NEURONTIN) 300 MG capsule Take 1 capsule (300 mg total) by mouth at bedtime. 90 capsule 2  . Glucosamine HCl (GLUCOSAMINE PO) Take by mouth daily.    . hydrochlorothiazide (HYDRODIURIL) 25 MG tablet TAKE 1 TABLET (25 MG TOTAL) BY MOUTH DAILY. 90 tablet 1  . ketoconazole (NIZORAL) 2 % shampoo Apply 1 application topically 2 (two) times a week. 120 mL 03  . levothyroxine (SYNTHROID,  LEVOTHROID) 112 MCG tablet TAKE 1 TABLET (112 MCG TOTAL) BY MOUTH DAILY BEFORE BREAKFAST. 90 tablet 1  . mupirocin ointment (BACTROBAN) 2 % Apply twice daily to bridge of the nose. 22 g 0  . Omega-3 Fatty Acids (FISH OIL) 1200 MG CAPS Take 2,400 mg by mouth daily.     Marland Kitchen omeprazole (PRILOSEC) 40 MG capsule TAKE 1 CAPSULE (40 MG TOTAL) BY MOUTH DAILY. 90 capsule 1  . ranitidine (ZANTAC) 150 MG tablet Take 1-2 tablets (  150-300 mg total) by mouth at bedtime as needed for heartburn. 60 tablet 2  . rosuvastatin (CRESTOR) 20 MG tablet Take 1 tablet (20 mg total) by mouth daily. 90 tablet 2  . traMADol (ULTRAM) 50 MG tablet Take 2 tablets (100 mg total) by mouth every 6 (six) hours as needed for moderate pain or severe pain. 120 tablet 0  . vitamin B-12 (CYANOCOBALAMIN) 1000 MCG tablet Take 1,000 mcg by mouth daily.     No facility-administered medications prior to visit.     Allergies  Allergen Reactions  . Lipitor [Atorvastatin] Swelling  . Metronidazole Swelling  . Penicillins Swelling  . Pregabalin Other (See Comments)    REACTION: Somnolence and dizziness  . Simvastatin Other (See Comments)    Leg pain    Review of Systems  Constitutional: Positive for malaise/fatigue. Negative for fever.  HENT: Negative for congestion.   Eyes: Negative for blurred vision.  Respiratory: Positive for cough, sputum production, shortness of breath and wheezing.   Cardiovascular: Negative for chest pain, palpitations and leg swelling.  Gastrointestinal: Negative for abdominal pain, blood in stool and nausea.  Genitourinary: Negative for dysuria and frequency.  Musculoskeletal: Negative for falls.  Skin: Negative for rash.  Neurological: Positive for weakness. Negative for dizziness, loss of consciousness and headaches.  Endo/Heme/Allergies: Negative for environmental allergies.  Psychiatric/Behavioral: Negative for depression. The patient is not nervous/anxious.        Objective:    Physical Exam    Constitutional: She is oriented to person, place, and time. She appears well-developed and well-nourished. No distress.  HENT:  Head: Normocephalic and atraumatic.  Nose: Nose normal.  Eyes: Right eye exhibits no discharge. Left eye exhibits no discharge.  Neck: Normal range of motion. Neck supple.  Cardiovascular: Normal rate and regular rhythm.   No murmur heard. Pulmonary/Chest: Effort normal and breath sounds normal.  Decreased breath sounds b/l bases.  Abdominal: Soft. Bowel sounds are normal. There is no tenderness.  Musculoskeletal: She exhibits no edema.  Neurological: She is alert and oriented to person, place, and time.  Skin: Skin is warm and dry.  Psychiatric: She has a normal mood and affect.  Nursing note and vitals reviewed.   BP 112/80 (BP Location: Left Arm, Patient Position: Sitting, Cuff Size: Normal)   Pulse 64   Temp 98.2 F (36.8 C) (Oral)   Ht 5' 3.5" (1.613 m)   Wt 175 lb (79.4 kg)   SpO2 91%   BMI 30.51 kg/m  Wt Readings from Last 3 Encounters:  03/24/17 175 lb (79.4 kg)  02/17/17 173 lb 6.4 oz (78.7 kg)  10/21/16 178 lb 12.8 oz (81.1 kg)   BP Readings from Last 3 Encounters:  03/24/17 112/80  02/17/17 120/82  10/21/16 126/68     Immunization History  Administered Date(s) Administered  . Influenza Split 05/08/2011, 05/26/2012  . Influenza Whole 06/03/2006, 05/31/2007, 04/26/2008, 05/23/2009, 05/20/2010  . Influenza, High Dose Seasonal PF 05/23/2013, 04/03/2016  . Influenza,inj,Quad PF,6+ Mos 04/27/2014, 06/15/2015  . Pneumococcal Conjugate-13 04/27/2014  . Pneumococcal Polysaccharide-23 06/18/2011, 07/03/2016  . Tdap 06/18/2011    Health Maintenance  Topic Date Due  . INFLUENZA VACCINE  03/04/2017  . MAMMOGRAM  05/23/2018  . Fecal DNA (Cologuard)  07/11/2018  . TETANUS/TDAP  06/17/2021  . DEXA SCAN  Completed  . Hepatitis C Screening  Completed  . PNA vac Low Risk Adult  Completed    Lab Results  Component Value Date   WBC 10.3  03/24/2017   HGB  15.2 (H) 03/24/2017   HCT 45.5 03/24/2017   PLT 199.0 03/24/2017   GLUCOSE 99 03/24/2017   CHOL 151 03/03/2017   TRIG 231.0 (H) 03/03/2017   HDL 37.00 (L) 03/03/2017   LDLDIRECT 84.0 03/03/2017   LDLCALC 112 (H) 10/26/2015   ALT 20 03/24/2017   AST 25 03/24/2017   NA 139 03/24/2017   K 4.0 03/24/2017   CL 103 03/24/2017   CREATININE 0.90 03/24/2017   BUN 17 03/24/2017   CO2 29 03/24/2017   TSH 0.45 03/03/2017   INR 1.06 06/23/2013   HGBA1C 5.7 03/03/2017   MICROALBUR 0.2 04/27/2014    Lab Results  Component Value Date   TSH 0.45 03/03/2017   Lab Results  Component Value Date   WBC 10.3 03/24/2017   HGB 15.2 (H) 03/24/2017   HCT 45.5 03/24/2017   MCV 90.0 03/24/2017   PLT 199.0 03/24/2017   Lab Results  Component Value Date   NA 139 03/24/2017   K 4.0 03/24/2017   CO2 29 03/24/2017   GLUCOSE 99 03/24/2017   BUN 17 03/24/2017   CREATININE 0.90 03/24/2017   BILITOT 0.8 03/24/2017   ALKPHOS 50 03/24/2017   AST 25 03/24/2017   ALT 20 03/24/2017   PROT 7.6 03/24/2017   ALBUMIN 4.0 03/24/2017   CALCIUM 10.0 03/24/2017   ANIONGAP 9 09/14/2014   GFR 65.24 03/24/2017   Lab Results  Component Value Date   CHOL 151 03/03/2017   Lab Results  Component Value Date   HDL 37.00 (L) 03/03/2017   Lab Results  Component Value Date   LDLCALC 112 (H) 10/26/2015   Lab Results  Component Value Date   TRIG 231.0 (H) 03/03/2017   Lab Results  Component Value Date   CHOLHDL 4 03/03/2017   Lab Results  Component Value Date   HGBA1C 5.7 03/03/2017         Assessment & Plan:   Problem List Items Addressed This Visit    Obesity (Chronic)    Encouraged DASH diet, decrease po intake and increase exercise as tolerated. Needs 7-8 hours of sleep nightly. Avoid trans fats, eat small, frequent meals every 4-5 hours with lean proteins, complex carbs and healthy fats. Minimize simple carbs      SOB (shortness of breath) - Primary    COPD  exacerbation. Started on antibiotics, steroids and Mucinex. Responded to albuterol nebulizer so she is set up with a nebulizer at home.       Relevant Medications   albuterol (PROVENTIL) (2.5 MG/3ML) 0.083% nebulizer solution 2.5 mg (Completed)   Other Relevant Orders   PR INHAL RX, AIRWAY OBST/DX SPUTUM INDUCT    Other Visit Diagnoses    COPD exacerbation (HCC)       Relevant Medications   albuterol (PROVENTIL) (2.5 MG/3ML) 0.083% nebulizer solution 2.5 mg (Completed)   methylPREDNISolone (MEDROL) 4 MG tablet   Other Relevant Orders   PR INHAL RX, AIRWAY OBST/DX SPUTUM INDUCT   DG Chest 2 View (Completed)   CBC with Differential/Platelet (Completed)   Comprehensive metabolic panel (Completed)   DME Nebulizer machine      I am having Kimberly Irwin start on methylPREDNISolone and doxycycline. I am also having her maintain her Cinnamon, Fish Oil, Calcium Carbonate-Vit D-Min (CALCIUM 1200 PO), Vitamin D, Coenzyme Q10, aspirin EC, traMADol, BESIVANCE, mupirocin ointment, Glucosamine HCl (GLUCOSAMINE PO), fexofenadine-pseudoephedrine, vitamin B-12, ranitidine, budesonide-formoterol, albuterol, atenolol, ketoconazole, rosuvastatin, omeprazole, levothyroxine, gabapentin, hydrochlorothiazide, atenolol, and citalopram. We administered albuterol.  Meds ordered this encounter  Medications  . albuterol (PROVENTIL) (2.5 MG/3ML) 0.083% nebulizer solution 2.5 mg  . methylPREDNISolone (MEDROL) 4 MG tablet    Sig: 5 tab po qd X 1d then 4 tab po qd X 1d then 3 tab po qd X 1d then 2 tab po qd then 1 tab po qd    Dispense:  15 tablet    Refill:  0  . doxycycline (VIBRA-TABS) 100 MG tablet    Sig: Take 1 tablet (100 mg total) by mouth 2 (two) times daily.    Dispense:  20 tablet    Refill:  0  . citalopram (CELEXA) 10 MG tablet    Sig: TAKE 1 TABLET (10 MG TOTAL) BY MOUTH DAILY.    Dispense:  90 tablet    Refill:  1  . DISCONTD: albuterol (PROVENTIL) (2.5 MG/3ML) 0.083% nebulizer solution    Sig: Take  3 mLs (2.5 mg total) by nebulization every 6 (six) hours as needed for wheezing or shortness of breath. Dispense 1 box with 3 additional refills    Dispense:  3 mL    Refill:  4    CMA served as scribe during this visit. History, Physical and Plan performed by medical provider. Documentation and orders reviewed and attested to.  Penni Homans, MD

## 2017-03-24 NOTE — Patient Instructions (Signed)
Chronic Obstructive Pulmonary Disease Exacerbation  Chronic obstructive pulmonary disease (COPD) is a common lung problem. In COPD, the flow of air from the lungs is limited. COPD exacerbations are times that breathing gets worse and you need extra treatment. Without treatment they can be life threatening. If they happen often, your lungs can become more damaged. If your COPD gets worse, your doctor may treat you with:  ? Medicines.  ? Oxygen.  ? Different ways to clear your airway, such as using a mask.    Follow these instructions at home:  ? Do not smoke.  ? Avoid tobacco smoke and other things that bother your lungs.  ? If given, take your antibiotic medicine as told. Finish the medicine even if you start to feel better.  ? Only take medicines as told by your doctor.  ? Drink enough fluids to keep your pee (urine) clear or pale yellow (unless your doctor has told you not to).  ? Use a cool mist machine (vaporizer).  ? If you use oxygen or a machine that turns liquid medicine into a mist (nebulizer), continue to use them as told.  ? Keep up with shots (vaccinations) as told by your doctor.  ? Exercise regularly.  ? Eat healthy foods.  ? Keep all doctor visits as told.  Get help right away if:  ? You are very short of breath and it gets worse.  ? You have trouble talking.  ? You have bad chest pain.  ? You have blood in your spit (sputum).  ? You have a fever.  ? You keep throwing up (vomiting).  ? You feel weak, or you pass out (faint).  ? You feel confused.  ? You keep getting worse.  This information is not intended to replace advice given to you by your health care provider. Make sure you discuss any questions you have with your health care provider.  Document Released: 07/10/2011 Document Revised: 12/27/2015 Document Reviewed: 03/25/2013  Elsevier Interactive Patient Education ? 2017 [employer]

## 2017-03-25 MED ORDER — ALBUTEROL SULFATE (2.5 MG/3ML) 0.083% IN NEBU: 2.5000 mg | INHALATION_SOLUTION | Freq: Four times a day (QID) | RESPIRATORY_TRACT | 4 refills | Status: DC | PRN

## 2017-03-25 NOTE — Telephone Encounter (Signed)
Rx called into the pharmacy.  Called patient and made her aware.

## 2017-03-25 NOTE — Telephone Encounter (Signed)
Out of [facility] tooday

## 2017-03-27 ENCOUNTER — Telehealth: Payer: Self-pay | Admitting: Family Medicine

## 2017-03-27 NOTE — Telephone Encounter (Signed)
[facility] stating Dx/pt aware/thx dmf

## 2017-03-27 NOTE — Telephone Encounter (Signed)
Relation to RC:BULA Call back number:[phone] Pharmacy:  Reason for call:   Patient states pharmacy requesting new Rx for albuterol (PROVENTIL) (2.5 MG/3ML) 0.083% nebulizer solution that reflects ICD 10 code, please advise

## 2017-03-29 NOTE — Assessment & Plan Note (Signed)
Encouraged DASH diet, decrease po intake and increase exercise as tolerated. Needs 7-8 hours of sleep nightly. Avoid trans fats, eat small, frequent meals every 4-5 hours with lean proteins, complex carbs and healthy fats. Minimize simple carbs 

## 2017-03-29 NOTE — Assessment & Plan Note (Signed)
COPD exacerbation. Started on antibiotics, steroids and Mucinex. Responded to albuterol nebulizer so she is set up with a nebulizer at home.

## 2017-03-30 ENCOUNTER — Other Ambulatory Visit: Payer: Self-pay

## 2017-03-30 MED ORDER — ALBUTEROL SULFATE (2.5 MG/3ML) 0.083% IN NEBU: 2.5000 mg | INHALATION_SOLUTION | Freq: Four times a day (QID) | RESPIRATORY_TRACT | 4 refills | Status: DC | PRN

## 2017-04-01 ENCOUNTER — Encounter: Payer: Self-pay | Admitting: Adult Health

## 2017-04-01 ENCOUNTER — Ambulatory Visit (INDEPENDENT_AMBULATORY_CARE_PROVIDER_SITE_OTHER): Payer: Medicare Other | Admitting: Adult Health

## 2017-04-01 DIAGNOSIS — J9611 Chronic respiratory failure with hypoxia: Secondary | ICD-10-CM

## 2017-04-01 DIAGNOSIS — J439 Emphysema, unspecified: Secondary | ICD-10-CM

## 2017-04-01 DIAGNOSIS — D869 Sarcoidosis, unspecified: Secondary | ICD-10-CM | POA: Diagnosis not present

## 2017-04-01 NOTE — Assessment & Plan Note (Signed)
Cont on O2 with act and At bedtime

## 2017-04-01 NOTE — Progress Notes (Signed)
_0  ID: Kimberly Irwin, female    DOB: 1945/09/24, 71 y.o.   MRN: [id number]  Chief Complaint  Patient presents with  . Follow-up    Sarcoid     Referring provider: Mosie Lukes, MD  HPI: 71 year old female former smoker followed for COPD  and sarcoidosis diagnosed around 67 . She had a mediastinoscopy + granulomas.  And chronic respiratory failure on O2 2 l/m with act and at bedtime     TEST  PFT (05/2016) FEV1  1.59  72%,  DLCO 35% FeNO is 20 05/2016  2-D echo on 04/09/2016 showed an EF of 75-10%, grade 1 diastolic dysfunction. Pulmonary artery pressure 44 mmHg CT chest October 2017 showed no PE . Mild emphysematous changes. Stress Myoview 2011 neg for ischemia .   04/01/2017 Follow : Sarcoid and COPD  Patient returns for a six-month follow-up. Patient remains on Symbicort twice daily for her COPD. Does not feel symbicort does a lot for her.  Patient says she had cough over the last 2 weeks. She was seen by her primary care physician last week and diagnosed with bronchitis. She was started on doxycycline and Medrol dose pack. She has a few days left. She is starting to feel better with decreased cough and shortness of breath. Patient has known sarcoid. Chest x-ray done last week showed no acute changes.. Previous pulmonary function test in October 2017 showed FEV1 at 72% with a DLCO at 35%..  Pneumovax and Prevnar up-to-date.   Allergies  Allergen Reactions  . Lipitor [Atorvastatin] Swelling  . Metronidazole Swelling  . Penicillins Swelling  . Pregabalin Other (See Comments)    REACTION: Somnolence and dizziness  . Simvastatin Other (See Comments)    Leg pain    Immunization History  Administered Date(s) Administered  . Influenza Split 05/08/2011, 05/26/2012  . Influenza Whole 06/03/2006, 05/31/2007, 04/26/2008, 05/23/2009, 05/20/2010  . Influenza, High Dose Seasonal PF 05/23/2013, 04/03/2016  . Influenza,inj,Quad PF,6+ Mos 04/27/2014, 06/15/2015  .  Pneumococcal Conjugate-13 04/27/2014  . Pneumococcal Polysaccharide-23 06/18/2011, 07/03/2016  . Tdap 06/18/2011    Past Medical History:  Diagnosis Date  . Acute hip pain 10/26/2012  . Bronchitis 09/04/2016  . Bursitis of left hip 10/26/2012  . Cardiomegaly 07/13/2016  . Chronic low back pain   . Coronary artery calcification 07/03/2016  . DDD (degenerative disc disease)    L3-4 with facet arthropathy and stenosis  . Degenerative spondylolisthesis    L4-5 grade 1 with stenosis  . GERD (gastroesophageal reflux disease)   . Heart murmur    as an infant  . History of hiatal hernia   . Hyperlipidemia   . Hypertension   . Hypothyroidism   . Hypothyroidism 10/28/2015  . Hypoxia, sleep related 10/21/2016  . Pain in joint, lower leg 10/26/2012   left   . Pneumonia    as a child several times.   . Sarcoidosis   . SOB (shortness of breath) 04/03/2016  . Tremors of nervous system     Tobacco History: History  Smoking Status  . Former Smoker  . Packs/day: 0.50  . Years: 32.00  . Types: Cigarettes  . Quit date: 07/22/1994  Smokeless Tobacco  . Never Used   Counseling given: Not Answered   Outpatient Encounter Prescriptions as of 04/01/2017  Medication Sig  . albuterol (PROVENTIL HFA;VENTOLIN HFA) 108 (90 Base) MCG/ACT inhaler Inhale 2 puffs into the lungs every 6 (six) hours as needed for wheezing or shortness of breath.  Marland Kitchen albuterol (PROVENTIL) (2.5  MG/3ML) 0.083% nebulizer solution Take 3 mLs (2.5 mg total) by nebulization every 6 (six) hours as needed for wheezing or shortness of breath.  Marland Kitchen aspirin EC 81 MG tablet Take 81 mg by mouth daily.  Marland Kitchen atenolol (TENORMIN) 25 MG tablet Take 1 tablet (25 mg total) by mouth at bedtime. Take the 50 mg Atenolol in am and the 25 mg in pm  . atenolol (TENORMIN) 50 MG tablet TAKE 1 TABLET (50 MG TOTAL) BY MOUTH DAILY.  . BESIVANCE 0.6 % SUSP Reported on 07/23/2015  . budesonide-formoterol (SYMBICORT) 160-4.5 MCG/ACT inhaler Inhale 2 puffs  into the lungs 2 (two) times daily.  . Calcium Carbonate-Vit D-Min (CALCIUM 1200 PO) Take 1,200 mg by mouth daily.   . Cholecalciferol (VITAMIN D) 2000 UNITS tablet Take 2,000 Units by mouth daily.  . Cinnamon 500 MG capsule Take 2,000 mg by mouth daily.    . citalopram (CELEXA) 10 MG tablet TAKE 1 TABLET (10 MG TOTAL) BY MOUTH DAILY.  Marland Kitchen Coenzyme Q10 200 MG capsule Take 200 mg by mouth daily.  Marland Kitchen doxycycline (VIBRA-TABS) 100 MG tablet Take 1 tablet (100 mg total) by mouth 2 (two) times daily.  . fexofenadine-pseudoephedrine (ALLEGRA-D 24) 180-240 MG 24 hr tablet Take 1 tablet by mouth daily.  Marland Kitchen gabapentin (NEURONTIN) 300 MG capsule Take 1 capsule (300 mg total) by mouth at bedtime.  . Glucosamine HCl (GLUCOSAMINE PO) Take by mouth daily.  . hydrochlorothiazide (HYDRODIURIL) 25 MG tablet TAKE 1 TABLET (25 MG TOTAL) BY MOUTH DAILY.  Marland Kitchen ketoconazole (NIZORAL) 2 % shampoo Apply 1 application topically 2 (two) times a week.  . levothyroxine (SYNTHROID, LEVOTHROID) 112 MCG tablet TAKE 1 TABLET (112 MCG TOTAL) BY MOUTH DAILY BEFORE BREAKFAST.  . mupirocin ointment (BACTROBAN) 2 % Apply twice daily to bridge of the nose.  . Omega-3 Fatty Acids (FISH OIL) 1200 MG CAPS Take 2,400 mg by mouth daily.   Marland Kitchen omeprazole (PRILOSEC) 40 MG capsule TAKE 1 CAPSULE (40 MG TOTAL) BY MOUTH DAILY.  . ranitidine (ZANTAC) 150 MG tablet Take 1-2 tablets (150-300 mg total) by mouth at bedtime as needed for heartburn.  . rosuvastatin (CRESTOR) 20 MG tablet Take 1 tablet (20 mg total) by mouth daily.  . traMADol (ULTRAM) 50 MG tablet Take 2 tablets (100 mg total) by mouth every 6 (six) hours as needed for moderate pain or severe pain.  . vitamin B-12 (CYANOCOBALAMIN) 1000 MCG tablet Take 1,000 mcg by mouth daily.  . [DISCONTINUED] methylPREDNISolone (MEDROL) 4 MG tablet 5 tab po qd X 1d then 4 tab po qd X 1d then 3 tab po qd X 1d then 2 tab po qd then 1 tab po qd (Patient not taking: Reported on 04/01/2017)   No  facility-administered encounter medications on file as of 04/01/2017.      Review of Systems  Constitutional:   No  weight loss, night sweats,  Fevers, chills, fatigue, or  lassitude.  HEENT:   No headaches,  Difficulty swallowing,  Tooth/dental problems, or  Sore throat,                No sneezing, itching, ear ache, nasal congestion, post nasal drip,   CV:  No chest pain,  Orthopnea, PND, swelling in lower extremities, anasarca, dizziness, palpitations, syncope.   GI  No heartburn, indigestion, abdominal pain, nausea, vomiting, diarrhea, change in bowel habits, loss of appetite, bloody stools.   Resp:    No chest wall deformity  Skin: no rash or lesions.  GU:  no dysuria, change in color of urine, no urgency or frequency.  No flank pain, no hematuria   MS:  No joint pain or swelling.  No decreased range of motion.  No back pain.    Physical Exam  BP 112/72 (BP Location: Left Arm, Cuff Size: Normal)   Pulse 68   Ht 5' 3" (1.6 m)   Wt 176 lb 3.2 oz (79.9 kg)   SpO2 93%   BMI 31.21 kg/m   GEN: A/Ox3; pleasant , NAD, well nourished    HEENT:  [place]/AT,  EACs-clear, TMs-wnl, NOSE-clear, THROAT-clear, no lesions, no postnasal drip or exudate noted.   NECK:  Supple w/ fair ROM; no JVD; normal carotid impulses w/o bruits; no thyromegaly or nodules palpated; no lymphadenopathy.    RESP  Clear  P & A; w/o, wheezes/ rales/ or rhonchi. no accessory muscle use, no dullness to percussion  CARD:  RRR, no m/r/g, no peripheral edema, pulses intact, no cyanosis or clubbing.  GI:   Soft & nt; nml bowel sounds; no organomegaly or masses detected.   Musco: Warm bil, no deformities or joint swelling noted.   Neuro: alert, no focal deficits noted.  Mild tremor .   Skin: Warm, no lesions or rashes    Lab Results:  CBC   BNP No results found for: BNP  ProBNP No results found for: PROBNP  Imaging: Dg Chest 2 View  Result Date: 03/24/2017 CLINICAL DATA:  Cough for several days,  worse recently, former smoking history EXAM: CHEST  2 VIEW COMPARISON:  Chest x-ray of 09/23/2016 and CT angio chest of 05/23/2016 FINDINGS: No active infiltrate or effusion is seen. Mediastinal and hilar contours are unremarkable. The heart is mildly enlarged and stable. No acute bony abnormality is seen. Hardware for fusion of the upper lumbar spine is noted. IMPRESSION: Stable mild cardiomegaly.  No active lung disease. Electronically Signed   By: [name] M.D.   On: 03/24/2017 15:06     Assessment & Plan:   COPD (chronic obstructive pulmonary disease) (HCC) Mild to Mod COPD - recent bronchitis improving on abx and steroids  She wants to stop Symbicort , advised to remain on but trial of 1 puff Twice daily  .   Plan  Patient Instructions  Can decrease Symbicort 1 puff Twice daily  , rinse after use .  Continue on Oxygen 2l/m with activity and At bedtime   Follow up with Dr. Elsworth Soho in [facility] in 6 months and As needed      Sarcoidosis stage I Appears stable , CXR ok - no changes on previous CXR 2011, 2014 or 2018.  Cont to follow   Chronic respiratory failure with hypoxia ([place]) Cont on O2 with act and At bedtime       Rexene Edison, NP 04/01/2017

## 2017-04-01 NOTE — Patient Instructions (Signed)
Can decrease Symbicort 1 puff Twice daily  , rinse after use .  Continue on Oxygen 2l/m with activity and At bedtime   Follow up with Dr. Elsworth Soho in [facility] in 6 months and As needed

## 2017-04-01 NOTE — Assessment & Plan Note (Signed)
Mild to Mod COPD - recent bronchitis improving on abx and steroids  She wants to stop Symbicort , advised to remain on but trial of 1 puff Twice daily  .   Plan  Patient Instructions  Can decrease Symbicort 1 puff Twice daily  , rinse after use .  Continue on Oxygen 2l/m with activity and At bedtime   Follow up with Dr. Elsworth Soho in [facility] in 6 months and As needed

## 2017-04-01 NOTE — Assessment & Plan Note (Signed)
Appears stable , CXR ok - no changes on previous CXR 2011, 2014 or 2018.  Cont to follow

## 2017-05-05 ENCOUNTER — Encounter (INDEPENDENT_AMBULATORY_CARE_PROVIDER_SITE_OTHER): Payer: Medicare Other | Admitting: Ophthalmology

## 2017-05-05 ENCOUNTER — Telehealth: Payer: Self-pay | Admitting: Adult Health

## 2017-05-05 DIAGNOSIS — I10 Essential (primary) hypertension: Secondary | ICD-10-CM

## 2017-05-05 DIAGNOSIS — H35033 Hypertensive retinopathy, bilateral: Secondary | ICD-10-CM

## 2017-05-05 DIAGNOSIS — H43813 Vitreous degeneration, bilateral: Secondary | ICD-10-CM | POA: Diagnosis not present

## 2017-05-05 DIAGNOSIS — H34832 Tributary (branch) retinal vein occlusion, left eye, with macular edema: Secondary | ICD-10-CM

## 2017-05-05 NOTE — Telephone Encounter (Signed)
Spoke with Colletta Maryland at [facility] She stated that they just needed a recent OV stating that the patient was still on O2. OV needs to be faxed to [phone].   Advised patient that I would fax the notes over to [facility] so that she can continue to receive her O2. She verbalized understanding. Nothing else needed at time of call.   Will fax notes to [facility].

## 2017-05-25 DIAGNOSIS — Z1231 Encounter for screening mammogram for malignant neoplasm of breast: Secondary | ICD-10-CM | POA: Diagnosis not present

## 2017-05-25 DIAGNOSIS — Z803 Family history of malignant neoplasm of breast: Secondary | ICD-10-CM | POA: Diagnosis not present

## 2017-05-25 LAB — HM MAMMOGRAPHY

## 2017-06-03 ENCOUNTER — Ambulatory Visit (INDEPENDENT_AMBULATORY_CARE_PROVIDER_SITE_OTHER): Payer: Medicare Other

## 2017-06-03 DIAGNOSIS — Z23 Encounter for immunization: Secondary | ICD-10-CM | POA: Diagnosis not present

## 2017-06-08 ENCOUNTER — Other Ambulatory Visit: Payer: Self-pay | Admitting: Family Medicine

## 2017-06-19 ENCOUNTER — Ambulatory Visit (INDEPENDENT_AMBULATORY_CARE_PROVIDER_SITE_OTHER): Payer: Medicare Other | Admitting: Family Medicine

## 2017-06-19 ENCOUNTER — Encounter: Payer: Self-pay | Admitting: Family Medicine

## 2017-06-19 VITALS — BP 141/79 | HR 79 | Temp 97.9°F | Resp 18 | Wt 174.6 lb

## 2017-06-19 DIAGNOSIS — E669 Obesity, unspecified: Secondary | ICD-10-CM | POA: Diagnosis not present

## 2017-06-19 DIAGNOSIS — E782 Mixed hyperlipidemia: Secondary | ICD-10-CM

## 2017-06-19 DIAGNOSIS — E039 Hypothyroidism, unspecified: Secondary | ICD-10-CM | POA: Diagnosis not present

## 2017-06-19 DIAGNOSIS — R739 Hyperglycemia, unspecified: Secondary | ICD-10-CM | POA: Diagnosis not present

## 2017-06-19 DIAGNOSIS — R0602 Shortness of breath: Secondary | ICD-10-CM | POA: Diagnosis not present

## 2017-06-19 DIAGNOSIS — I10 Essential (primary) hypertension: Secondary | ICD-10-CM | POA: Diagnosis not present

## 2017-06-19 DIAGNOSIS — J439 Emphysema, unspecified: Secondary | ICD-10-CM | POA: Diagnosis not present

## 2017-06-19 DIAGNOSIS — Z6830 Body mass index (BMI) 30.0-30.9, adult: Secondary | ICD-10-CM | POA: Diagnosis not present

## 2017-06-19 LAB — CBC
HEMATOCRIT: 45.7 % (ref 36.0–46.0)
HEMOGLOBIN: 15.5 g/dL — AB (ref 12.0–15.0)
MCHC: 33.9 g/dL (ref 30.0–36.0)
MCV: 89.7 fl (ref 78.0–100.0)
Platelets: 198 10*3/uL (ref 150.0–400.0)
RBC: 5.1 Mil/uL (ref 3.87–5.11)
RDW: 14 % (ref 11.5–15.5)
WBC: 10.2 10*3/uL (ref 4.0–10.5)

## 2017-06-19 LAB — LIPID PANEL
CHOLESTEROL: 143 mg/dL (ref 0–200)
HDL: 41.1 mg/dL (ref >39.00)
LDL Cholesterol: 74 mg/dL (ref 0–99)
NonHDL: 101.94
TRIGLYCERIDES: 142 mg/dL (ref 0.0–149.0)
Total CHOL/HDL Ratio: 3
VLDL: 28.4 mg/dL (ref 0.0–40.0)

## 2017-06-19 LAB — COMPREHENSIVE METABOLIC PANEL
ALBUMIN: 4.2 g/dL (ref 3.5–5.2)
ALK PHOS: 48 U/L (ref 39–117)
ALT: 13 U/L (ref 0–35)
AST: 18 U/L (ref 0–37)
BUN: 15 mg/dL (ref 6–23)
CALCIUM: 9.9 mg/dL (ref 8.4–10.5)
CO2: 26 mEq/L (ref 19–32)
Chloride: 101 mEq/L (ref 96–112)
Creatinine, Ser: 0.88 mg/dL (ref 0.40–1.20)
GFR: 66.91 mL/min (ref >60.00)
Glucose, Bld: 107 mg/dL — ABNORMAL HIGH (ref 70–99)
POTASSIUM: 3.3 meq/L — AB (ref 3.5–5.1)
Sodium: 138 mEq/L (ref 135–145)
TOTAL PROTEIN: 7.4 g/dL (ref 6.0–8.3)
Total Bilirubin: 0.9 mg/dL (ref 0.2–1.2)

## 2017-06-19 LAB — TSH: TSH: 0.37 u[IU]/mL (ref 0.35–4.50)

## 2017-06-19 LAB — HEMOGLOBIN A1C: Hgb A1c MFr Bld: 5.5 % (ref 4.6–6.5)

## 2017-06-19 MED ORDER — MOMETASONE FUROATE 50 MCG/ACT NA SUSP: 2.0000 | Freq: Every day | NASAL | 12 refills | Status: DC | PRN

## 2017-06-19 NOTE — Patient Instructions (Addendum)
Pulmonology: Dr Halford Chessman and Dr Lorraine Lax is the new shingles shot, 2 shots over 2-6 months at pharmacy Hypertension Hypertension is another name for high blood pressure. High blood pressure forces your heart to work harder to pump blood. This can cause problems over time. There are two numbers in a blood pressure reading. There is a top number (systolic) over a bottom number (diastolic). It is best to have a blood pressure below 120/80. Healthy choices can help lower your blood pressure. You may need medicine to help lower your blood pressure if:  Your blood pressure cannot be lowered with healthy choices.  Your blood pressure is higher than 130/80.  Follow these instructions at home: Eating and drinking  If directed, follow the DASH eating plan. This diet includes: ? Filling half of your plate at each meal with fruits and vegetables. ? Filling one quarter of your plate at each meal with whole grains. Whole grains include whole wheat pasta, brown rice, and whole grain bread. ? Eating or drinking low-fat dairy products, such as skim milk or low-fat yogurt. ? Filling one quarter of your plate at each meal with low-fat (lean) proteins. Low-fat proteins include fish, skinless chicken, eggs, beans, and tofu. ? Avoiding fatty meat, cured and processed meat, or chicken with skin. ? Avoiding premade or processed food.  Eat less than 1,500 mg of salt (sodium) a day.  Limit alcohol use to no more than 1 drink a day for nonpregnant women and 2 drinks a day for men. One drink equals 12 oz of beer, 5 oz of wine, or 1 oz of hard liquor. Lifestyle  Work with your doctor to stay at a healthy weight or to lose weight. Ask your doctor what the best weight is for you.  Get at least 30 minutes of exercise that causes your heart to beat faster (aerobic exercise) most days of the week. This may include walking, swimming, or biking.  Get at least 30 minutes of exercise that strengthens your muscles  (resistance exercise) at least 3 days a week. This may include lifting weights or pilates.  Do not use any products that contain nicotine or tobacco. This includes cigarettes and e-cigarettes. If you need help quitting, ask your doctor.  Check your blood pressure at home as told by your doctor.  Keep all follow-up visits as told by your doctor. This is important. Medicines  Take over-the-counter and prescription medicines only as told by your doctor. Follow directions carefully.  Do not skip doses of blood pressure medicine. The medicine does not work as well if you skip doses. Skipping doses also puts you at risk for problems.  Ask your doctor about side effects or reactions to medicines that you should watch for. Contact a doctor if:  You think you are having a reaction to the medicine you are taking.  You have headaches that keep coming back (recurring).  You feel dizzy.  You have swelling in your ankles.  You have trouble with your vision. Get help right away if:  You get a very bad headache.  You start to feel confused.  You feel weak or numb.  You feel faint.  You get very bad pain in your: ? Chest. ? Belly (abdomen).  You throw up (vomit) more than once.  You have trouble breathing. Summary  Hypertension is another name for high blood pressure.  Making healthy choices can help lower blood pressure. If your blood pressure cannot be controlled with healthy choices, you  may need to take medicine. This information is not intended to replace advice given to you by your health care provider. Make sure you discuss any questions you have with your health care provider. Document Released: 01/07/2008 Document Revised: 06/18/2016 Document Reviewed: 06/18/2016 Elsevier Interactive Patient Education  [employer].

## 2017-06-19 NOTE — Assessment & Plan Note (Signed)
Well controlled, no changes to meds. Encouraged heart healthy diet such as the DASH diet and exercise as tolerated.

## 2017-06-19 NOTE — Assessment & Plan Note (Signed)
hgba1c acceptable, minimize simple carbs. Increase exercise as tolerated.  

## 2017-06-19 NOTE — Assessment & Plan Note (Signed)
Refer to cardiology, has seen Dr Oran Rein in the past

## 2017-06-19 NOTE — Progress Notes (Signed)
Subjective:  I acted as a Education administrator for Dr. Charlett Blake. [name], [place]  Patient ID: Kimberly Irwin, female    DOB: 1946-04-01, 71 y.o.   MRN: [id number]  No chief complaint on file.   HPI  Patient is in today for a 4 month follow up and is frustrated with her persistent sob. She is most frustrated with her dyspnea with minimal exertion. It greatly limits her ability to perform her ADLs and to enjoy any activities. No recent febrile illness or hospitalizations. She notes the sob contributes to anxiety and anhedonia as well as weakness. Denies CP/palp/HA/congestion/fevers/GI or GU c/o. Taking meds as prescribed  Patient Care Team: Mosie Lukes, MD as PCP - General (Family Medicine) Earnie Larsson, MD as Consulting Physician (Neurosurgery) Paralee Cancel, MD as Consulting Physician (Orthopedic Surgery) Parrett, Fonnie Mu, NP as Nurse Practitioner (Pulmonary Disease)   Past Medical History:  Diagnosis Date  . Acute hip pain 10/26/2012  . Bronchitis 09/04/2016  . Bursitis of left hip 10/26/2012  . Cardiomegaly 07/13/2016  . Chronic low back pain   . Coronary artery calcification 07/03/2016  . DDD (degenerative disc disease)    L3-4 with facet arthropathy and stenosis  . Degenerative spondylolisthesis    L4-5 grade 1 with stenosis  . GERD (gastroesophageal reflux disease)   . Heart murmur    as an infant  . History of hiatal hernia   . Hyperlipidemia   . Hypertension   . Hypothyroidism   . Hypothyroidism 10/28/2015  . Hypoxia, sleep related 10/21/2016  . Pain in joint, lower leg 10/26/2012   left   . Pneumonia    as a child several times.   . Sarcoidosis   . SOB (shortness of breath) 04/03/2016  . Tremors of nervous system     Past Surgical History:  Procedure Laterality Date  . BACK SURGERY  01/15/09   L3-4 and L4-5 decompressive laminectomy with bilateral L3, L4, and L5 decompressive foraminotomies, more than it would be required for simple interbody fusion alone.  Marland Kitchen BACK SURGERY  01/15/09    L3-4 and L4-5 posterior lumbar interbody fusion utilizing tanget interbody allograft wedge, Telamon interbody PEEk cage, and local autografting.  Marland Kitchen BACK SURGERY  01/15/09   L3, L4, and L5 posterolateral arthrodesis using segmental pedicle screw fixation and local autografting.  Marland Kitchen CARDIAC CATHETERIZATION    . CARPAL TUNNEL RELEASE Bilateral   . COLONOSCOPY    . ESOPHAGOGASTRODUODENOSCOPY    . EXTREME LEFT LATERAL INTERBODY FUSION LUMBAR TWO-THREE LATERAL PLATE Left 0/96/2836   Performed by Charlie Pitter, MD at [facility] NEURO ORS  . EYE SURGERY     lens implant  . JOINT REPLACEMENT  09/13/09   Right Hip, Dr. Alvan Dame  . LEFT TOTAL  HIP ARTHROPLASTY ANTERIOR APPROACH Left 06/28/2013   Performed by Mauri Pole, MD at [facility] ORS  . Lumbar Two Kyphoplasty/Vertebroplasty N/A 09/14/2014   Performed by Charlie Pitter, MD at [facility] NEURO ORS  . THYROIDECTOMY      Family History  Problem Relation Age of Onset  . Cancer Mother 62       Breast  . Heart disease Mother   . Hypertension Mother   . Hyperlipidemia Mother   . Heart attack Mother   . Asthma Maternal Grandmother     Social History   Socioeconomic History  . Marital status: Widowed    Spouse name: Not on file  . Number of children: 2  . Years of education: Not on file  .  Highest education level: Not on file  Social Needs  . Financial resource strain: Not on file  . Food insecurity - worry: Not on file  . Food insecurity - inability: Not on file  . Transportation needs - medical: Not on file  . Transportation needs - non-medical: Not on file  Occupational History  . Not on file  Tobacco Use  . Smoking status: Former Smoker    Packs/day: 0.50    Years: 32.00    Pack years: 16.00    Types: Cigarettes    Last attempt to quit: 07/22/1994    Years since quitting: 22.9  . Smokeless tobacco: Never Used  Substance and Sexual Activity  . Alcohol use: No    Alcohol/week: 0.0 oz  . Drug use: No  . Sexual activity: No  Other Topics  Concern  . Not on file  Social History Narrative   Married 62 years and has 2 children (2 daughters)   Alcohol Use - no   Former Smoker - she quit 10 years ago, started when she was 55 and has had varying level of use of tobacco products from 1/2 pack to 3 packs per day.    Outpatient Medications Prior to Visit  Medication Sig Dispense Refill  . albuterol (PROVENTIL HFA;VENTOLIN HFA) 108 (90 Base) MCG/ACT inhaler Inhale 2 puffs into the lungs every 6 (six) hours as needed for wheezing or shortness of breath. 1 Inhaler 6  . albuterol (PROVENTIL) (2.5 MG/3ML) 0.083% nebulizer solution Take 3 mLs (2.5 mg total) by nebulization every 6 (six) hours as needed for wheezing or shortness of breath. 30 mL 4  . aspirin EC 81 MG tablet Take 81 mg by mouth daily.    Marland Kitchen atenolol (TENORMIN) 25 MG tablet Take 1 tablet (25 mg total) by mouth at bedtime. Take the 50 mg Atenolol in am and the 25 mg in pm 90 tablet 3  . atenolol (TENORMIN) 50 MG tablet TAKE 1 TABLET (50 MG TOTAL) BY MOUTH DAILY. 90 tablet 1  . BESIVANCE 0.6 % SUSP Reported on 07/23/2015  12  . budesonide-formoterol (SYMBICORT) 160-4.5 MCG/ACT inhaler Inhale 2 puffs into the lungs 2 (two) times daily. 1 Inhaler 5  . Calcium Carbonate-Vit D-Min (CALCIUM 1200 PO) Take 1,200 mg by mouth daily.     . Cholecalciferol (VITAMIN D) 2000 UNITS tablet Take 2,000 Units by mouth daily.    . Cinnamon 500 MG capsule Take 2,000 mg by mouth daily.      . citalopram (CELEXA) 10 MG tablet TAKE 1 TABLET (10 MG TOTAL) BY MOUTH DAILY. 90 tablet 1  . Coenzyme Q10 200 MG capsule Take 200 mg by mouth daily.    Marland Kitchen doxycycline (VIBRA-TABS) 100 MG tablet Take 1 tablet (100 mg total) by mouth 2 (two) times daily. 20 tablet 0  . fexofenadine-pseudoephedrine (ALLEGRA-D 24) 180-240 MG 24 hr tablet Take 1 tablet by mouth daily.    Marland Kitchen gabapentin (NEURONTIN) 300 MG capsule Take 1 capsule (300 mg total) by mouth at bedtime. 90 capsule 2  . Glucosamine HCl (GLUCOSAMINE PO) Take by  mouth daily.    . hydrochlorothiazide (HYDRODIURIL) 25 MG tablet TAKE 1 TABLET (25 MG TOTAL) BY MOUTH DAILY. 90 tablet 1  . ketoconazole (NIZORAL) 2 % shampoo Apply 1 application topically 2 (two) times a week. 120 mL 03  . levothyroxine (SYNTHROID, LEVOTHROID) 112 MCG tablet TAKE 1 TABLET (112 MCG TOTAL) BY MOUTH DAILY BEFORE BREAKFAST. 90 tablet 1  . mupirocin ointment (BACTROBAN) 2 %  Apply twice daily to bridge of the nose. 22 g 0  . Omega-3 Fatty Acids (FISH OIL) 1200 MG CAPS Take 2,400 mg by mouth daily.     Marland Kitchen omeprazole (PRILOSEC) 40 MG capsule TAKE 1 CAPSULE (40 MG TOTAL) BY MOUTH DAILY. 90 capsule 1  . ranitidine (ZANTAC) 150 MG tablet Take 1-2 tablets (150-300 mg total) by mouth at bedtime as needed for heartburn. 60 tablet 2  . rosuvastatin (CRESTOR) 20 MG tablet Take 1 tablet (20 mg total) by mouth daily. 90 tablet 2  . traMADol (ULTRAM) 50 MG tablet Take 2 tablets (100 mg total) by mouth every 6 (six) hours as needed for moderate pain or severe pain. 120 tablet 0  . vitamin B-12 (CYANOCOBALAMIN) 1000 MCG tablet Take 1,000 mcg by mouth daily.     No facility-administered medications prior to visit.     Allergies  Allergen Reactions  . Lipitor [Atorvastatin] Swelling  . Metronidazole Swelling  . Penicillins Swelling  . Pregabalin Other (See Comments)    REACTION: Somnolence and dizziness  . Simvastatin Other (See Comments)    Leg pain    Review of Systems  Constitutional: Positive for malaise/fatigue. Negative for fever.  HENT: Negative for congestion.   Eyes: Negative for blurred vision.  Respiratory: Positive for shortness of breath. Negative for cough.   Cardiovascular: Negative for chest pain, palpitations and leg swelling.  Gastrointestinal: Negative for vomiting.  Musculoskeletal: Negative for back pain.  Skin: Negative for rash.  Neurological: Positive for tremors. Negative for loss of consciousness and headaches.       Objective:    Physical Exam    Constitutional: She is oriented to person, place, and time. She appears well-developed and well-nourished. No distress.  HENT:  Head: Normocephalic and atraumatic.  Eyes: Conjunctivae are normal.  Neck: Normal range of motion. No thyromegaly present.  Cardiovascular: Normal rate and regular rhythm.  Murmur heard. Pulmonary/Chest: Effort normal and breath sounds normal. She has no wheezes.  Abdominal: Soft. Bowel sounds are normal. There is no tenderness.  Musculoskeletal: Normal range of motion. She exhibits no edema or deformity.  Neurological: She is alert and oriented to person, place, and time.  Skin: Skin is warm and dry. She is not diaphoretic.  Psychiatric: She has a normal mood and affect.    BP (!) 141/79 (BP Location: Left Arm, Patient Position: Sitting, Cuff Size: Normal)   Pulse 79   Temp 97.9 F (36.6 C) (Oral)   Resp 18   Wt 174 lb 9.6 oz (79.2 kg)   SpO2 (!) 85%   BMI 30.93 kg/m  Wt Readings from Last 3 Encounters:  06/19/17 174 lb 9.6 oz (79.2 kg)  04/01/17 176 lb 3.2 oz (79.9 kg)  03/24/17 175 lb (79.4 kg)   BP Readings from Last 3 Encounters:  06/19/17 (!) 141/79  04/01/17 112/72  03/24/17 112/80     Immunization History  Administered Date(s) Administered  . Influenza Split 05/08/2011, 05/26/2012  . Influenza Whole 06/03/2006, 05/31/2007, 04/26/2008, 05/23/2009, 05/20/2010  . Influenza, High Dose Seasonal PF 05/23/2013, 04/03/2016, 06/03/2017  . Influenza,inj,Quad PF,6+ Mos 04/27/2014, 06/15/2015  . Pneumococcal Conjugate-13 04/27/2014  . Pneumococcal Polysaccharide-23 06/18/2011, 07/03/2016  . Tdap 06/18/2011    Health Maintenance  Topic Date Due  . MAMMOGRAM  05/23/2018  . Fecal DNA (Cologuard)  07/11/2018  . TETANUS/TDAP  06/17/2021  . INFLUENZA VACCINE  Completed  . DEXA SCAN  Completed  . Hepatitis C Screening  Completed  . PNA vac Low Risk  Adult  Completed    Lab Results  Component Value Date   WBC 10.2 06/19/2017   HGB 15.5 (H)  06/19/2017   HCT 45.7 06/19/2017   PLT 198.0 06/19/2017   GLUCOSE 107 (H) 06/19/2017   CHOL 143 06/19/2017   TRIG 142.0 06/19/2017   HDL 41.10 06/19/2017   LDLDIRECT 84.0 03/03/2017   LDLCALC 74 06/19/2017   ALT 13 06/19/2017   AST 18 06/19/2017   NA 138 06/19/2017   K 3.3 (L) 06/19/2017   CL 101 06/19/2017   CREATININE 0.88 06/19/2017   BUN 15 06/19/2017   CO2 26 06/19/2017   TSH 0.37 06/19/2017   INR 1.06 06/23/2013   HGBA1C 5.5 06/19/2017   MICROALBUR 0.2 04/27/2014    Lab Results  Component Value Date   TSH 0.37 06/19/2017   Lab Results  Component Value Date   WBC 10.2 06/19/2017   HGB 15.5 (H) 06/19/2017   HCT 45.7 06/19/2017   MCV 89.7 06/19/2017   PLT 198.0 06/19/2017   Lab Results  Component Value Date   NA 138 06/19/2017   K 3.3 (L) 06/19/2017   CO2 26 06/19/2017   GLUCOSE 107 (H) 06/19/2017   BUN 15 06/19/2017   CREATININE 0.88 06/19/2017   BILITOT 0.9 06/19/2017   ALKPHOS 48 06/19/2017   AST 18 06/19/2017   ALT 13 06/19/2017   PROT 7.4 06/19/2017   ALBUMIN 4.2 06/19/2017   CALCIUM 9.9 06/19/2017   ANIONGAP 9 09/14/2014   GFR 66.91 06/19/2017   Lab Results  Component Value Date   CHOL 143 06/19/2017   Lab Results  Component Value Date   HDL 41.10 06/19/2017   Lab Results  Component Value Date   LDLCALC 74 06/19/2017   Lab Results  Component Value Date   TRIG 142.0 06/19/2017   Lab Results  Component Value Date   CHOLHDL 3 06/19/2017   Lab Results  Component Value Date   HGBA1C 5.5 06/19/2017         Assessment & Plan:   Problem List Items Addressed This Visit    Hyperlipidemia (Chronic)    Encouraged heart healthy diet, increase exercise, avoid trans fats, consider a krill oil cap daily      Relevant Orders   Ambulatory referral to Cardiology   Lipid panel (Completed)   Essential hypertension (Chronic)    Well controlled, no changes to meds. Encouraged heart healthy diet such as the DASH diet and exercise as  tolerated.       Relevant Orders   Ambulatory referral to Cardiology   CBC (Completed)   Comprehensive metabolic panel (Completed)   TSH (Completed)   Hyperglycemia    hgba1c acceptable, minimize simple carbs. Increase exercise as tolerated.       Relevant Orders   Ambulatory referral to Cardiology   Hemoglobin A1c (Completed)   Obesity (Chronic)    Encouraged DASH diet, decrease po intake and increase exercise as tolerated. Needs 7-8 hours of sleep nightly. Avoid trans fats, eat small, frequent meals every 4-5 hours with lean proteins, complex carbs and healthy fats. Minimize simple carbs      Hypothyroidism    On Levothyroxine, continue to monitor      SOB (shortness of breath) - Primary    Refer to cardiology, has seen Dr Oran Rein in the past      Relevant Orders   Ambulatory referral to Cardiology   COPD (chronic obstructive pulmonary disease) ([place])    She is frustrated with needing her  oxygen use and does not feel her inhalers have helped her much. She has an app with pulmonology first of the year and agrees to stay on her meds til then.      Relevant Medications   mometasone (NASONEX) 50 MCG/ACT nasal spray      I am having Tylisa H. Reveron start on mometasone. I am also having her maintain her Cinnamon, Fish Oil, Calcium Carbonate-Vit D-Min (CALCIUM 1200 PO), Vitamin D, Coenzyme Q10, aspirin EC, traMADol, BESIVANCE, mupirocin ointment, Glucosamine HCl (GLUCOSAMINE PO), fexofenadine-pseudoephedrine, vitamin B-12, ranitidine, budesonide-formoterol, albuterol, atenolol, ketoconazole, rosuvastatin, levothyroxine, gabapentin, hydrochlorothiazide, atenolol, doxycycline, citalopram, albuterol, and omeprazole.  Meds ordered this encounter  Medications  . mometasone (NASONEX) 50 MCG/ACT nasal spray    Sig: Place 2 sprays daily as needed into the nose.    Dispense:  17 g    Refill:  12    CMA served as scribe during this visit. History, Physical and Plan performed by medical  provider. Documentation and orders reviewed and attested to.  Penni Homans, MD

## 2017-06-19 NOTE — Assessment & Plan Note (Signed)
Encouraged heart healthy diet, increase exercise, avoid trans fats, consider a krill oil cap daily

## 2017-06-21 NOTE — Assessment & Plan Note (Signed)
She is frustrated with needing her oxygen use and does not feel her inhalers have helped her much. She has an app with pulmonology first of the year and agrees to stay on her meds til then.

## 2017-06-21 NOTE — Assessment & Plan Note (Signed)
Encouraged DASH diet, decrease po intake and increase exercise as tolerated. Needs 7-8 hours of sleep nightly. Avoid trans fats, eat small, frequent meals every 4-5 hours with lean proteins, complex carbs and healthy fats. Minimize simple carbs 

## 2017-06-21 NOTE — Assessment & Plan Note (Signed)
On Levothyroxine, continue to monitor

## 2017-06-23 ENCOUNTER — Encounter (INDEPENDENT_AMBULATORY_CARE_PROVIDER_SITE_OTHER): Payer: Medicare Other | Admitting: Ophthalmology

## 2017-06-23 DIAGNOSIS — H34832 Tributary (branch) retinal vein occlusion, left eye, with macular edema: Secondary | ICD-10-CM | POA: Diagnosis not present

## 2017-06-23 DIAGNOSIS — H35033 Hypertensive retinopathy, bilateral: Secondary | ICD-10-CM | POA: Diagnosis not present

## 2017-06-23 DIAGNOSIS — I10 Essential (primary) hypertension: Secondary | ICD-10-CM

## 2017-06-23 DIAGNOSIS — H43813 Vitreous degeneration, bilateral: Secondary | ICD-10-CM

## 2017-07-01 ENCOUNTER — Telehealth: Payer: Self-pay | Admitting: Family Medicine

## 2017-07-01 MED ORDER — POTASSIUM CHLORIDE ER 10 MEQ PO TBCR: 10.0000 meq | EXTENDED_RELEASE_TABLET | Freq: Every day | ORAL | 0 refills | Status: DC

## 2017-07-01 NOTE — Progress Notes (Deleted)
Referring-Kimberly Charlett Blake MD Reason for referral-Dyspnea  HPI: 71 year old female for evaluation of dyspnea at request of Penni Homans M.D. Nuclear study 2011 showed ejection fraction 77%and no ischemia or infarction. Echocardiogram September 2017 showed normal LV systolic function, grade 1 diastolic dysfunction, and mild tricuspid regurgitation. Pulmonary function tests October 2017 showed mild COPD and decreased diffusing capacity. Chest CT October 2017 showed no pulmonary embolus. Coronary calcification noted. Mild emphysematous changes at lung apices. Laboratories November 2018 showed hemoglobin 15.5 and normal TSH.  Current Outpatient Medications  Medication Sig Dispense Refill  . albuterol (PROVENTIL HFA;VENTOLIN HFA) 108 (90 Base) MCG/ACT inhaler Inhale 2 puffs into the lungs every 6 (six) hours as needed for wheezing or shortness of breath. 1 Inhaler 6  . albuterol (PROVENTIL) (2.5 MG/3ML) 0.083% nebulizer solution Take 3 mLs (2.5 mg total) by nebulization every 6 (six) hours as needed for wheezing or shortness of breath. 30 mL 4  . aspirin EC 81 MG tablet Take 81 mg by mouth daily.    Marland Kitchen atenolol (TENORMIN) 25 MG tablet Take 1 tablet (25 mg total) by mouth at bedtime. Take the 50 mg Atenolol in am and the 25 mg in pm 90 tablet 3  . atenolol (TENORMIN) 50 MG tablet TAKE 1 TABLET (50 MG TOTAL) BY MOUTH DAILY. 90 tablet 1  . BESIVANCE 0.6 % SUSP Reported on 07/23/2015  12  . budesonide-formoterol (SYMBICORT) 160-4.5 MCG/ACT inhaler Inhale 2 puffs into the lungs 2 (two) times daily. 1 Inhaler 5  . Calcium Carbonate-Vit D-Min (CALCIUM 1200 PO) Take 1,200 mg by mouth daily.     . Cholecalciferol (VITAMIN D) 2000 UNITS tablet Take 2,000 Units by mouth daily.    . Cinnamon 500 MG capsule Take 2,000 mg by mouth daily.      . citalopram (CELEXA) 10 MG tablet TAKE 1 TABLET (10 MG TOTAL) BY MOUTH DAILY. 90 tablet 1  . Coenzyme Q10 200 MG capsule Take 200 mg by mouth daily.    Marland Kitchen doxycycline  (VIBRA-TABS) 100 MG tablet Take 1 tablet (100 mg total) by mouth 2 (two) times daily. 20 tablet 0  . fexofenadine-pseudoephedrine (ALLEGRA-D 24) 180-240 MG 24 hr tablet Take 1 tablet by mouth daily.    Marland Kitchen gabapentin (NEURONTIN) 300 MG capsule Take 1 capsule (300 mg total) by mouth at bedtime. 90 capsule 2  . Glucosamine HCl (GLUCOSAMINE PO) Take by mouth daily.    . hydrochlorothiazide (HYDRODIURIL) 25 MG tablet TAKE 1 TABLET (25 MG TOTAL) BY MOUTH DAILY. 90 tablet 1  . ketoconazole (NIZORAL) 2 % shampoo Apply 1 application topically 2 (two) times a week. 120 mL 03  . levothyroxine (SYNTHROID, LEVOTHROID) 112 MCG tablet TAKE 1 TABLET (112 MCG TOTAL) BY MOUTH DAILY BEFORE BREAKFAST. 90 tablet 1  . mometasone (NASONEX) 50 MCG/ACT nasal spray Place 2 sprays daily as needed into the nose. 17 g 12  . mupirocin ointment (BACTROBAN) 2 % Apply twice daily to bridge of the nose. 22 g 0  . Omega-3 Fatty Acids (FISH OIL) 1200 MG CAPS Take 2,400 mg by mouth daily.     Marland Kitchen omeprazole (PRILOSEC) 40 MG capsule TAKE 1 CAPSULE (40 MG TOTAL) BY MOUTH DAILY. 90 capsule 1  . potassium chloride (K-DUR) 10 MEQ tablet Take 1 tablet (10 mEq total) by mouth daily. 30 tablet 0  . ranitidine (ZANTAC) 150 MG tablet Take 1-2 tablets (150-300 mg total) by mouth at bedtime as needed for heartburn. 60 tablet 2  . rosuvastatin (CRESTOR) 20 MG  tablet Take 1 tablet (20 mg total) by mouth daily. 90 tablet 2  . traMADol (ULTRAM) 50 MG tablet Take 2 tablets (100 mg total) by mouth every 6 (six) hours as needed for moderate pain or severe pain. 120 tablet 0  . vitamin B-12 (CYANOCOBALAMIN) 1000 MCG tablet Take 1,000 mcg by mouth daily.     No current facility-administered medications for this visit.     Allergies  Allergen Reactions  . Lipitor [Atorvastatin] Swelling  . Metronidazole Swelling  . Penicillins Swelling  . Pregabalin Other (See Comments)    REACTION: Somnolence and dizziness  . Simvastatin Other (See Comments)    Leg  pain    Past Medical History:  Diagnosis Date  . Acute hip pain 10/26/2012  . Bronchitis 09/04/2016  . Bursitis of left hip 10/26/2012  . Cardiomegaly 07/13/2016  . Chronic low back pain   . Coronary artery calcification 07/03/2016  . DDD (degenerative disc disease)    L3-4 with facet arthropathy and stenosis  . Degenerative spondylolisthesis    L4-5 grade 1 with stenosis  . GERD (gastroesophageal reflux disease)   . Heart murmur    as an infant  . History of hiatal hernia   . Hyperlipidemia   . Hypertension   . Hypothyroidism   . Hypothyroidism 10/28/2015  . Hypoxia, sleep related 10/21/2016  . Pain in joint, lower leg 10/26/2012   left   . Pneumonia    as a child several times.   . Sarcoidosis   . SOB (shortness of breath) 04/03/2016  . Tremors of nervous system     Past Surgical History:  Procedure Laterality Date  . ANTERIOR LAT LUMBAR FUSION Left 08/29/2014   Procedure: EXTREME LEFT LATERAL INTERBODY FUSION LUMBAR TWO-THREE LATERAL PLATE;  Surgeon: Charlie Pitter, MD;  Location: [facility] NEURO ORS;  Service: Neurosurgery;  Laterality: Left;  . BACK SURGERY  01/15/09   L3-4 and L4-5 decompressive laminectomy with bilateral L3, L4, and L5 decompressive foraminotomies, more than it would be required for simple interbody fusion alone.  Marland Kitchen BACK SURGERY  01/15/09   L3-4 and L4-5 posterior lumbar interbody fusion utilizing tanget interbody allograft wedge, Telamon interbody PEEk cage, and local autografting.  Marland Kitchen BACK SURGERY  01/15/09   L3, L4, and L5 posterolateral arthrodesis using segmental pedicle screw fixation and local autografting.  Marland Kitchen CARDIAC CATHETERIZATION    . CARPAL TUNNEL RELEASE Bilateral   . COLONOSCOPY    . ESOPHAGOGASTRODUODENOSCOPY    . EYE SURGERY     lens implant  . JOINT REPLACEMENT  09/13/09   Right Hip, Dr. Alvan Dame  . KYPHOPLASTY N/A 09/14/2014   Procedure: Lumbar Two Kyphoplasty/Vertebroplasty;  Surgeon: Charlie Pitter, MD;  Location: [facility]UR[facility] ORS;  Service:  Neurosurgery;  Laterality: N/A;  Lumbar Two Kyphoplasty/Vertebroplasty  . THYROIDECTOMY    . TOTAL HIP ARTHROPLASTY Left 06/28/2013   Procedure: LEFT TOTAL  HIP ARTHROPLASTY ANTERIOR APPROACH;  Surgeon: Mauri Pole, MD;  Location: [facility] ORS;  Service: Orthopedics;  Laterality: Left;    Social History   Socioeconomic History  . Marital status: Widowed    Spouse name: Not on file  . Number of children: 2  . Years of education: Not on file  . Highest education level: Not on file  Social Needs  . Financial resource strain: Not on file  . Food insecurity - worry: Not on file  . Food insecurity - inability: Not on file  . Transportation needs - medical: Not on file  .  Transportation needs - non-medical: Not on file  Occupational History  . Not on file  Tobacco Use  . Smoking status: Former Smoker    Packs/day: 0.50    Years: 32.00    Pack years: 16.00    Types: Cigarettes    Last attempt to quit: 07/22/1994    Years since quitting: 22.9  . Smokeless tobacco: Never Used  Substance and Sexual Activity  . Alcohol use: No    Alcohol/week: 0.0 oz  . Drug use: No  . Sexual activity: No  Other Topics Concern  . Not on file  Social History Narrative   Married 93 years and has 2 children (2 daughters)   Alcohol Use - no   Former Smoker - she quit 10 years ago, started when she was 65 and has had varying level of use of tobacco products from 1/2 pack to 3 packs per day.    Family History  Problem Relation Age of Onset  . Cancer Mother 64       Breast  . Heart disease Mother   . Hypertension Mother   . Hyperlipidemia Mother   . Heart attack Mother   . Asthma Maternal Grandmother     ROS: no fevers or chills, productive cough, hemoptysis, dysphasia, odynophagia, melena, hematochezia, dysuria, hematuria, rash, seizure activity, orthopnea, PND, pedal edema, claudication. Remaining systems are negative.  Physical Exam:   There were no vitals taken for this visit.  General:   Well developed/well nourished in NAD Skin warm/dry Patient not depressed No peripheral clubbing Back-normal HEENT-normal/normal eyelids Neck supple/normal carotid upstroke bilaterally; no bruits; no JVD; no thyromegaly chest - CTA/ normal expansion CV - RRR/normal S1 and S2; no murmurs, rubs or gallops;  PMI nondisplaced Abdomen -NT/ND, no HSM, no mass, + bowel sounds, no bruit 2+ femoral pulses, no bruits Ext-no edema, chords, 2+ DP Neuro-grossly nonfocal  ECG - personally reviewed  A/P  1  Kimberly Ruths, MD

## 2017-07-01 NOTE — Telephone Encounter (Signed)
Patient notified of her results- will pick up medications and call for lab appointment.

## 2017-07-07 ENCOUNTER — Telehealth: Payer: Self-pay | Admitting: Family Medicine

## 2017-07-07 DIAGNOSIS — I10 Essential (primary) hypertension: Secondary | ICD-10-CM

## 2017-07-07 NOTE — Telephone Encounter (Signed)
Provider requested CMP- order placed.

## 2017-07-07 NOTE — Telephone Encounter (Signed)
Pt says that she is suppose to have a lab after starting her calcium pill. Pt called in and scheduled a lab for Thursday 07/09/17, please place orders.   Thanks.

## 2017-07-09 ENCOUNTER — Other Ambulatory Visit (INDEPENDENT_AMBULATORY_CARE_PROVIDER_SITE_OTHER): Payer: Medicare Other

## 2017-07-09 DIAGNOSIS — I10 Essential (primary) hypertension: Secondary | ICD-10-CM

## 2017-07-09 LAB — COMPREHENSIVE METABOLIC PANEL
ALBUMIN: 4.2 g/dL (ref 3.5–5.2)
ALT: 14 U/L (ref 0–35)
AST: 19 U/L (ref 0–37)
Alkaline Phosphatase: 48 U/L (ref 39–117)
BILIRUBIN TOTAL: 1 mg/dL (ref 0.2–1.2)
BUN: 16 mg/dL (ref 6–23)
CALCIUM: 9.7 mg/dL (ref 8.4–10.5)
CO2: 26 mEq/L (ref 19–32)
CREATININE: 0.97 mg/dL (ref 0.40–1.20)
Chloride: 101 mEq/L (ref 96–112)
GFR: 59.79 mL/min — ABNORMAL LOW (ref >60.00)
Glucose, Bld: 122 mg/dL — ABNORMAL HIGH (ref 70–99)
Potassium: 3.9 mEq/L (ref 3.5–5.1)
SODIUM: 138 meq/L (ref 135–145)
TOTAL PROTEIN: 7 g/dL (ref 6.0–8.3)

## 2017-07-10 ENCOUNTER — Other Ambulatory Visit: Payer: Self-pay | Admitting: Family Medicine

## 2017-07-11 ENCOUNTER — Other Ambulatory Visit: Payer: Self-pay | Admitting: Family Medicine

## 2017-07-15 ENCOUNTER — Ambulatory Visit: Payer: Medicare Other | Admitting: Cardiology

## 2017-07-16 ENCOUNTER — Encounter: Payer: Self-pay | Admitting: Family Medicine

## 2017-07-17 ENCOUNTER — Telehealth: Payer: Self-pay | Admitting: Family Medicine

## 2017-07-17 NOTE — Telephone Encounter (Signed)
Copied from [place]. Topic: Referral - Request >> Jul 17, 2017 12:03 PM Percell Belt A wrote: Reason for CRM:  pt called in and said that she was referred to Cardio Dr.  The Dr that she was referred to can not see her til FEB. She Already has a heart DR and would like the info sent over to: Dr Tollie Eth Address: [address] [place], [place], [place] [address] [phone]

## 2017-07-23 ENCOUNTER — Ambulatory Visit: Payer: Medicare Other | Admitting: Cardiovascular Disease

## 2017-07-28 ENCOUNTER — Other Ambulatory Visit: Payer: Self-pay | Admitting: Family Medicine

## 2017-08-03 NOTE — Telephone Encounter (Signed)
Please advise

## 2017-08-03 NOTE — Telephone Encounter (Signed)
She was normal but not high normal. Since she is on the HCTZ I thinks she should stay on the KCL 10 meq daily and send in RF if needed #30 with 3 rf til seen

## 2017-08-03 NOTE — Telephone Encounter (Signed)
Relation to pt: self Call back number: [phone]    Reason for call:  Patient checking on the status of referral mentioned below, please advise

## 2017-08-03 NOTE — Telephone Encounter (Addendum)
Relation to WL:SLHT Call back number:[phone]   Reason for call:   Patient states she recalls PCP advising being on potassium chloride (KLOR-CON 10) 10 MEQ tablet for 30 day only, patient unclear if she should continue medication. Stating her lab results came back good, please advise

## 2017-08-05 ENCOUNTER — Other Ambulatory Visit: Payer: Self-pay

## 2017-08-05 MED ORDER — POTASSIUM CHLORIDE ER 10 MEQ PO TBCR: 10.0000 meq | EXTENDED_RELEASE_TABLET | Freq: Every day | ORAL | 3 refills | Status: DC

## 2017-08-05 NOTE — Telephone Encounter (Signed)
Spoke with pt Rx sent with KCL 10 meq sent in with 3rf

## 2017-08-05 NOTE — Telephone Encounter (Signed)
I tried calling pt no answer

## 2017-08-10 DIAGNOSIS — R0602 Shortness of breath: Secondary | ICD-10-CM | POA: Diagnosis not present

## 2017-08-10 DIAGNOSIS — I2510 Atherosclerotic heart disease of native coronary artery without angina pectoris: Secondary | ICD-10-CM | POA: Diagnosis not present

## 2017-08-10 DIAGNOSIS — I700 Atherosclerosis of aorta: Secondary | ICD-10-CM | POA: Diagnosis not present

## 2017-08-10 DIAGNOSIS — K219 Gastro-esophageal reflux disease without esophagitis: Secondary | ICD-10-CM | POA: Diagnosis not present

## 2017-08-10 DIAGNOSIS — J449 Chronic obstructive pulmonary disease, unspecified: Secondary | ICD-10-CM | POA: Diagnosis not present

## 2017-08-10 DIAGNOSIS — E669 Obesity, unspecified: Secondary | ICD-10-CM | POA: Diagnosis not present

## 2017-08-10 DIAGNOSIS — E039 Hypothyroidism, unspecified: Secondary | ICD-10-CM | POA: Diagnosis not present

## 2017-08-10 DIAGNOSIS — I10 Essential (primary) hypertension: Secondary | ICD-10-CM | POA: Diagnosis not present

## 2017-08-10 DIAGNOSIS — R0609 Other forms of dyspnea: Secondary | ICD-10-CM | POA: Diagnosis not present

## 2017-08-10 DIAGNOSIS — E7849 Other hyperlipidemia: Secondary | ICD-10-CM | POA: Diagnosis not present

## 2017-08-11 ENCOUNTER — Encounter (INDEPENDENT_AMBULATORY_CARE_PROVIDER_SITE_OTHER): Payer: Medicare Other | Admitting: Ophthalmology

## 2017-08-11 DIAGNOSIS — H34832 Tributary (branch) retinal vein occlusion, left eye, with macular edema: Secondary | ICD-10-CM

## 2017-08-11 DIAGNOSIS — H43813 Vitreous degeneration, bilateral: Secondary | ICD-10-CM | POA: Diagnosis not present

## 2017-08-11 DIAGNOSIS — H35033 Hypertensive retinopathy, bilateral: Secondary | ICD-10-CM | POA: Diagnosis not present

## 2017-08-11 DIAGNOSIS — I10 Essential (primary) hypertension: Secondary | ICD-10-CM

## 2017-08-13 ENCOUNTER — Telehealth: Payer: Self-pay | Admitting: Family Medicine

## 2017-08-13 NOTE — Telephone Encounter (Signed)
She can have Amoxicillin 500 mg tabs, 1 tab po tid x 7 days and if she does not get better needs to come in. Also take mucinex twice daily x 7 days and a probiotic, if this is a virus the antibiotic will not help

## 2017-08-13 NOTE — Telephone Encounter (Signed)
Copied from [place] [id number]. Topic: Quick Communication - See Telephone Encounter >> Aug 13, 2017  1:12 PM Ether Griffins B wrote: CRM for notification. See Telephone encounter for:  Pt having a lot of coughing and last time this happened she had bronchitis and has an appt with cardiologist tmrw and cant come in for an appt. She is wanting to know if something can be called in. She states she doent have a fever.  I did inform pt she might need to come in and be evaluated but she wanted me to send a note to Dr. Frederik Pear nurse 08/13/17.

## 2017-08-13 NOTE — Telephone Encounter (Signed)
Please advise

## 2017-08-14 DIAGNOSIS — R0609 Other forms of dyspnea: Secondary | ICD-10-CM | POA: Diagnosis not present

## 2017-08-14 MED ORDER — DOXYCYCLINE HYCLATE 100 MG PO TABS: 100.0000 mg | ORAL_TABLET | Freq: Two times a day (BID) | ORAL | 0 refills | Status: DC

## 2017-08-14 NOTE — Telephone Encounter (Signed)
When placed amox for orders contraindications  came up. She last took amoxicillin 2014.  She has tolerated: medrol 2018 , bactrim 2017, doxycycline 2012  Please advise

## 2017-08-14 NOTE — Telephone Encounter (Signed)
rx sent in  Patient notified

## 2017-08-14 NOTE — Telephone Encounter (Signed)
Thanks, she can have Doxycycline 100 mg po bid x 7 days

## 2017-08-27 ENCOUNTER — Encounter: Payer: Self-pay | Admitting: Family Medicine

## 2017-08-27 DIAGNOSIS — E7849 Other hyperlipidemia: Secondary | ICD-10-CM | POA: Diagnosis not present

## 2017-08-27 DIAGNOSIS — I700 Atherosclerosis of aorta: Secondary | ICD-10-CM | POA: Diagnosis not present

## 2017-08-27 DIAGNOSIS — R0609 Other forms of dyspnea: Secondary | ICD-10-CM | POA: Diagnosis not present

## 2017-08-27 DIAGNOSIS — E039 Hypothyroidism, unspecified: Secondary | ICD-10-CM | POA: Diagnosis not present

## 2017-08-27 DIAGNOSIS — J449 Chronic obstructive pulmonary disease, unspecified: Secondary | ICD-10-CM | POA: Diagnosis not present

## 2017-08-27 DIAGNOSIS — K219 Gastro-esophageal reflux disease without esophagitis: Secondary | ICD-10-CM | POA: Diagnosis not present

## 2017-08-27 DIAGNOSIS — I2510 Atherosclerotic heart disease of native coronary artery without angina pectoris: Secondary | ICD-10-CM | POA: Diagnosis not present

## 2017-08-27 DIAGNOSIS — E669 Obesity, unspecified: Secondary | ICD-10-CM | POA: Diagnosis not present

## 2017-08-27 DIAGNOSIS — R0602 Shortness of breath: Secondary | ICD-10-CM | POA: Diagnosis not present

## 2017-08-27 DIAGNOSIS — I10 Essential (primary) hypertension: Secondary | ICD-10-CM | POA: Diagnosis not present

## 2017-09-10 ENCOUNTER — Encounter: Payer: Self-pay | Admitting: Adult Health

## 2017-09-10 ENCOUNTER — Ambulatory Visit (INDEPENDENT_AMBULATORY_CARE_PROVIDER_SITE_OTHER): Payer: Medicare Other | Admitting: Adult Health

## 2017-09-10 ENCOUNTER — Ambulatory Visit (HOSPITAL_BASED_OUTPATIENT_CLINIC_OR_DEPARTMENT_OTHER)
Admission: RE | Admit: 2017-09-10 | Discharge: 2017-09-10 | Disposition: A | Discharge status: Home / Self Care | Payer: Medicare Other | Source: Ambulatory Visit | Attending: Adult Health | Admitting: Adult Health

## 2017-09-10 VITALS — BP 121/84 | HR 63 | Wt 168.0 lb

## 2017-09-10 DIAGNOSIS — I700 Atherosclerosis of aorta: Secondary | ICD-10-CM | POA: Diagnosis not present

## 2017-09-10 DIAGNOSIS — J439 Emphysema, unspecified: Secondary | ICD-10-CM | POA: Diagnosis not present

## 2017-09-10 DIAGNOSIS — D869 Sarcoidosis, unspecified: Secondary | ICD-10-CM

## 2017-09-10 DIAGNOSIS — R0602 Shortness of breath: Secondary | ICD-10-CM | POA: Diagnosis not present

## 2017-09-10 NOTE — Assessment & Plan Note (Signed)
Check cxr today .  

## 2017-09-10 NOTE — Assessment & Plan Note (Signed)
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 Suspect her progressive sx are due to disease progression, deconditioning .  Check cxr  Increase Symbicort Twice daily   If not improving or worsens , consider repeat spiromety/CT chest   Plan  Patient Instructions  Chest xray today  Increase Symbicort 2 puffs Twice daily  , rinse after use.  Follow up with Dr. Halford Chessman  In 6 weeks at [facility] and As needed   Please contact office for sooner follow up if symptoms do not improve or worsen or seek emergency care

## 2017-09-10 NOTE — Patient Instructions (Signed)
Chest xray today  Increase Symbicort 2 puffs Twice daily  , rinse after use.  Follow up with Dr. Halford Chessman  In 6 weeks at [facility] and As needed   Please contact office for sooner follow up if symptoms do not improve or worsen or seek emergency care

## 2017-09-10 NOTE — Progress Notes (Signed)
_0  ID: Kimberly Irwin, female    DOB: 1946/04/13, 72 y.o.   MRN: [id number]  Chief Complaint  Patient presents with  . Follow-up    COPD     Referring provider: Mosie Lukes, MD  HPI: 72 year old female former smoker followed for COPD  and sarcoidosis diagnosed around 12 . She had a mediastinoscopy + granulomas.  And chronic respiratory failure on O2 2 l/m with act and at bedtime     TEST  PFT (05/2016) FEV1 1.59 72%, DLCO 35% FeNO is 20 05/2016  2-D echo on 04/09/2016 showed an EF of 40-98%, grade 1 diastolic dysfunction. Pulmonary artery pressure 44 mmHg CT chest October 2017 showed no PE . Mild emphysematous changes. Stress Myoview 2011 neg for ischemia .   09/10/2017 Follow up : Sarcoid /COPD  Pt presents for a 6 month follow up . She has Moderate COPD with Emphysema . Remains on Symbicort using only once daily . Says breathing is getting worse , gets winded with minmal activity such as daily house chores. She does not like to wear her oxygen out in public. Sometimes does not wear at home because she is afraid her dogs will get tangled in tubing . We discussed the importance of oxygen . Went over the potential complications of hypoxia . Walk test in office today pt needs O2 at 3l/m continuous flow to keep sats >90%. O2 at 2l/m at rest .  She denies chest pain , orthopnea, edema or calf pain .  She says she just had neg cardiac workup with Dr. Wynonia Lawman .     Allergies  Allergen Reactions  . Lipitor [Atorvastatin] Swelling  . Penicillins Swelling  . Metronidazole Swelling  . Pregabalin Other (See Comments)    REACTION: Somnolence and dizziness  . Simvastatin Other (See Comments)    Leg pain    Immunization History  Administered Date(s) Administered  . Influenza Split 05/08/2011, 05/26/2012  . Influenza Whole 06/03/2006, 05/31/2007, 04/26/2008, 05/23/2009, 05/20/2010  . Influenza, High Dose Seasonal PF 05/23/2013, 04/03/2016, 06/03/2017  . Influenza,inj,Quad  PF,6+ Mos 04/27/2014, 06/15/2015  . Pneumococcal Conjugate-13 04/27/2014  . Pneumococcal Polysaccharide-23 06/18/2011, 07/03/2016  . Tdap 06/18/2011    Past Medical History:  Diagnosis Date  . Acute hip pain 10/26/2012  . Bronchitis 09/04/2016  . Bursitis of left hip 10/26/2012  . Cardiomegaly 07/13/2016  . Chronic low back pain   . Coronary artery calcification 07/03/2016  . DDD (degenerative disc disease)    L3-4 with facet arthropathy and stenosis  . Degenerative spondylolisthesis    L4-5 grade 1 with stenosis  . GERD (gastroesophageal reflux disease)   . Heart murmur    as an infant  . History of hiatal hernia   . Hyperlipidemia   . Hypertension   . Hypothyroidism   . Hypothyroidism 10/28/2015  . Hypoxia, sleep related 10/21/2016  . Pain in joint, lower leg 10/26/2012   left   . Pneumonia    as a child several times.   . Sarcoidosis   . SOB (shortness of breath) 04/03/2016  . Tremors of nervous system     Tobacco History: Social History   Tobacco Use  Smoking Status Former Smoker  . Packs/day: 0.50  . Years: 32.00  . Pack years: 16.00  . Types: Cigarettes  . Last attempt to quit: 07/22/1994  . Years since quitting: 23.1  Smokeless Tobacco Never Used   Counseling given: Not Answered   Outpatient Encounter Medications as of 09/10/2017  Medication Sig  .  albuterol (PROVENTIL HFA;VENTOLIN HFA) 108 (90 Base) MCG/ACT inhaler Inhale 2 puffs into the lungs every 6 (six) hours as needed for wheezing or shortness of breath.  Marland Kitchen albuterol (PROVENTIL) (2.5 MG/3ML) 0.083% nebulizer solution Take 3 mLs (2.5 mg total) by nebulization every 6 (six) hours as needed for wheezing or shortness of breath.  Marland Kitchen aspirin EC 81 MG tablet Take 81 mg by mouth daily.  Marland Kitchen atenolol (TENORMIN) 25 MG tablet Take 1 tablet (25 mg total) by mouth at bedtime. Take the 50 mg Atenolol in am and the 25 mg in pm  . atenolol (TENORMIN) 50 MG tablet TAKE 1 TABLET (50 MG TOTAL) BY MOUTH DAILY.  . BESIVANCE  0.6 % SUSP Reported on 07/23/2015  . budesonide-formoterol (SYMBICORT) 160-4.5 MCG/ACT inhaler Inhale 2 puffs into the lungs 2 (two) times daily.  . Calcium Carbonate-Vit D-Min (CALCIUM 1200 PO) Take 1,200 mg by mouth daily.   . Cholecalciferol (VITAMIN D) 2000 UNITS tablet Take 2,000 Units by mouth daily.  . Cinnamon 500 MG capsule Take 2,000 mg by mouth daily.    . citalopram (CELEXA) 10 MG tablet TAKE 1 TABLET (10 MG TOTAL) BY MOUTH DAILY.  Marland Kitchen Coenzyme Q10 200 MG capsule Take 200 mg by mouth daily.  Marland Kitchen doxycycline (VIBRA-TABS) 100 MG tablet Take 1 tablet (100 mg total) by mouth 2 (two) times daily.  . fexofenadine-pseudoephedrine (ALLEGRA-D 24) 180-240 MG 24 hr tablet Take 1 tablet by mouth daily.  Marland Kitchen gabapentin (NEURONTIN) 300 MG capsule Take 1 capsule (300 mg total) by mouth at bedtime.  . Glucosamine HCl (GLUCOSAMINE PO) Take by mouth daily.  . hydrochlorothiazide (HYDRODIURIL) 25 MG tablet TAKE 1 TABLET (25 MG TOTAL) BY MOUTH DAILY.  Marland Kitchen ketoconazole (NIZORAL) 2 % shampoo Apply 1 application topically 2 (two) times a week.  . levothyroxine (SYNTHROID, LEVOTHROID) 112 MCG tablet TAKE 1 TABLET (112 MCG TOTAL) BY MOUTH DAILY BEFORE BREAKFAST.  . mometasone (NASONEX) 50 MCG/ACT nasal spray Place 2 sprays daily as needed into the nose.  . mupirocin ointment (BACTROBAN) 2 % Apply twice daily to bridge of the nose.  . Omega-3 Fatty Acids (FISH OIL) 1200 MG CAPS Take 2,400 mg by mouth daily.   Marland Kitchen omeprazole (PRILOSEC) 40 MG capsule TAKE 1 CAPSULE (40 MG TOTAL) BY MOUTH DAILY.  Marland Kitchen potassium chloride (KLOR-CON 10) 10 MEQ tablet Take 1 tablet (10 mEq total) by mouth daily.  . ranitidine (ZANTAC) 150 MG tablet Take 1-2 tablets (150-300 mg total) by mouth at bedtime as needed for heartburn.  . rosuvastatin (CRESTOR) 20 MG tablet TAKE 1 TABLET (20 MG TOTAL) BY MOUTH DAILY.  . traMADol (ULTRAM) 50 MG tablet Take 2 tablets (100 mg total) by mouth every 6 (six) hours as needed for moderate pain or severe pain.    . vitamin B-12 (CYANOCOBALAMIN) 1000 MCG tablet Take 1,000 mcg by mouth daily.   No facility-administered encounter medications on file as of 09/10/2017.      Review of Systems  Constitutional:   No  weight loss, night sweats,  Fevers, chills,  +fatigue, or  lassitude.  HEENT:   No headaches,  Difficulty swallowing,  Tooth/dental problems, or  Sore throat,                No sneezing, itching, ear ache, nasal congestion, post nasal drip,   CV:  No chest pain,  Orthopnea, PND, swelling in lower extremities, anasarca, dizziness, palpitations, syncope.   GI  No heartburn, indigestion, abdominal pain, nausea, vomiting, diarrhea, change in  bowel habits, loss of appetite, bloody stools.   Resp:   No chest wall deformity  Skin: no rash or lesions.  GU: no dysuria, change in color of urine, no urgency or frequency.  No flank pain, no hematuria   MS:  No joint pain or swelling.  No decreased range of motion.  No back pain.    Physical Exam  BP 121/84 (BP Location: Left Arm, Cuff Size: Normal)   Pulse 63   Wt 168 lb (76.2 kg)   SpO2 92%   BMI 29.76 kg/m   GEN: A/Ox3; pleasant , NAD, elderly , O2    HEENT:  /AT,  EACs-clear, TMs-wnl, NOSE-clear, THROAT-clear, no lesions, no postnasal drip or exudate noted.   NECK:  Supple w/ fair ROM; no JVD; normal carotid impulses w/o bruits; no thyromegaly or nodules palpated; no lymphadenopathy.    RESP  Clear  P & A; w/o, wheezes/ rales/ or rhonchi. no accessory muscle use, no dullness to percussion  CARD:  RRR, no m/r/g, no peripheral edema, pulses intact, no cyanosis or clubbing.  GI:   Soft & nt; nml bowel sounds; no organomegaly or masses detected.   Musco: Warm bil, no deformities or joint swelling noted.   Neuro: alert, no focal deficits noted.    Skin: Warm, no lesions or rashes    Lab Results:  CBC    Component Value Date/Time   WBC 10.2 06/19/2017 1135   RBC 5.10 06/19/2017 1135   HGB 15.5 (H) 06/19/2017 1135   HCT  45.7 06/19/2017 1135   PLT 198.0 06/19/2017 1135   MCV 89.7 06/19/2017 1135   MCH 30.5 09/14/2014 1445   MCHC 33.9 06/19/2017 1135   RDW 14.0 06/19/2017 1135   LYMPHSABS 2.6 03/24/2017 1152   MONOABS 1.0 03/24/2017 1152   EOSABS 0.2 03/24/2017 1152   BASOSABS 0.1 03/24/2017 1152    BMET    Component Value Date/Time   NA 138 07/09/2017 0953   K 3.9 07/09/2017 0953   CL 101 07/09/2017 0953   CO2 26 07/09/2017 0953   GLUCOSE 122 (H) 07/09/2017 0953   BUN 16 07/09/2017 0953   CREATININE 0.97 07/09/2017 0953   CREATININE 0.91 12/09/2013 1100   CALCIUM 9.7 07/09/2017 0953   GFRNONAA 83 (L) 09/14/2014 1445   GFRNONAA >60 03/19/2011 1015   GFRAA >90 09/14/2014 1445   GFRAA >60 03/19/2011 1015    BNP No results found for: BNP  ProBNP No results found for: PROBNP  Imaging: Dg Chest 2 View  Result Date: 09/10/2017 CLINICAL DATA:  Shortness of breath. EXAM: CHEST  2 VIEW COMPARISON:  Radiographs of March 24, 2017. FINDINGS: Stable cardiomediastinal silhouette. Atherosclerosis of thoracic aorta is noted. No pneumothorax or pleural effusion is noted. No acute pulmonary disease is noted. Bony thorax is unremarkable. IMPRESSION: No active cardiopulmonary disease.  Aortic atherosclerosis. Electronically Signed   By: Marijo Conception, M.D.   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 Suspect her progressive sx are due to disease progression, deconditioning .  Check cxr  Increase Symbicort Twice daily   If not improving or worsens , consider repeat spiromety/CT chest   Plan  Patient Instructions  Chest xray today  Increase Symbicort 2 puffs Twice daily  , rinse after use.  Follow up with Dr. Halford Chessman  In 6 weeks at [facility] and As needed   Please contact  office for sooner follow up if symptoms do not improve or worsen or seek emergency care       Sarcoidosis stage  I Check cxr today .      Rexene Edison, NP 09/10/2017

## 2017-09-11 ENCOUNTER — Telehealth: Payer: Self-pay | Admitting: Adult Health

## 2017-09-11 NOTE — Telephone Encounter (Signed)
Notes recorded by Melvenia Needles, NP on 09/10/2017 at 4:05 PM EST CXR w/ no sign of PNA  Cont w/ ov recs .Please contact office for sooner follow up if symptoms do not improve or worsen or seek emergency care  ------- Spoke with pt, aware of results/recs.  Nothing further needed.

## 2017-09-12 NOTE — Progress Notes (Signed)
Reviewed and agree with assessment/plan.   Yoanna Jurczyk, MD [facility] 07/30/2016, 12:24 PM Pager:  [phone]  

## 2017-09-13 ENCOUNTER — Other Ambulatory Visit: Payer: Self-pay | Admitting: Family Medicine

## 2017-09-15 ENCOUNTER — Other Ambulatory Visit: Payer: Self-pay | Admitting: Family Medicine

## 2017-09-24 ENCOUNTER — Emergency Department (HOSPITAL_BASED_OUTPATIENT_CLINIC_OR_DEPARTMENT_OTHER)
Admission: EM | Admit: 2017-09-24 | Discharge: 2017-09-24 | Disposition: A | Discharge status: Home / Self Care | Payer: Medicare Other | Attending: Emergency Medicine | Admitting: Emergency Medicine

## 2017-09-24 ENCOUNTER — Ambulatory Visit: Payer: Medicare Other | Admitting: Family Medicine

## 2017-09-24 ENCOUNTER — Encounter (HOSPITAL_BASED_OUTPATIENT_CLINIC_OR_DEPARTMENT_OTHER): Payer: Self-pay | Admitting: Emergency Medicine

## 2017-09-24 ENCOUNTER — Other Ambulatory Visit: Payer: Self-pay

## 2017-09-24 ENCOUNTER — Emergency Department (HOSPITAL_BASED_OUTPATIENT_CLINIC_OR_DEPARTMENT_OTHER): Payer: Medicare Other

## 2017-09-24 DIAGNOSIS — Z96643 Presence of artificial hip joint, bilateral: Secondary | ICD-10-CM | POA: Diagnosis not present

## 2017-09-24 DIAGNOSIS — R0609 Other forms of dyspnea: Secondary | ICD-10-CM

## 2017-09-24 DIAGNOSIS — J984 Other disorders of lung: Secondary | ICD-10-CM | POA: Diagnosis not present

## 2017-09-24 DIAGNOSIS — I10 Essential (primary) hypertension: Secondary | ICD-10-CM | POA: Insufficient documentation

## 2017-09-24 DIAGNOSIS — R05 Cough: Secondary | ICD-10-CM | POA: Diagnosis not present

## 2017-09-24 DIAGNOSIS — Z87891 Personal history of nicotine dependence: Secondary | ICD-10-CM | POA: Diagnosis not present

## 2017-09-24 DIAGNOSIS — E039 Hypothyroidism, unspecified: Secondary | ICD-10-CM | POA: Diagnosis not present

## 2017-09-24 DIAGNOSIS — R0602 Shortness of breath: Secondary | ICD-10-CM | POA: Diagnosis not present

## 2017-09-24 DIAGNOSIS — J449 Chronic obstructive pulmonary disease, unspecified: Secondary | ICD-10-CM | POA: Insufficient documentation

## 2017-09-24 DIAGNOSIS — R0600 Dyspnea, unspecified: Secondary | ICD-10-CM

## 2017-09-24 DIAGNOSIS — Z7982 Long term (current) use of aspirin: Secondary | ICD-10-CM | POA: Insufficient documentation

## 2017-09-24 DIAGNOSIS — Z79899 Other long term (current) drug therapy: Secondary | ICD-10-CM | POA: Diagnosis not present

## 2017-09-24 LAB — CBC WITH DIFFERENTIAL/PLATELET
Basophils Absolute: 0 K/uL (ref 0.0–0.1)
Basophils Relative: 0 %
Eosinophils Absolute: 0.1 K/uL (ref 0.0–0.7)
Eosinophils Relative: 1 %
HCT: 40.9 % (ref 36.0–46.0)
Hemoglobin: 14.2 g/dL (ref 12.0–15.0)
Lymphocytes Relative: 13 %
Lymphs Abs: 1.3 K/uL (ref 0.7–4.0)
MCH: 30.5 pg (ref 26.0–34.0)
MCHC: 34.7 g/dL (ref 30.0–36.0)
MCV: 88 fL (ref 78.0–100.0)
Monocytes Absolute: 0.8 K/uL (ref 0.1–1.0)
Monocytes Relative: 7 %
Neutro Abs: 8.2 K/uL — ABNORMAL HIGH (ref 1.7–7.7)
Neutrophils Relative %: 79 %
Platelets: 163 K/uL (ref 150–400)
RBC: 4.65 MIL/uL (ref 3.87–5.11)
RDW: 13.8 % (ref 11.5–15.5)
WBC: 10.4 K/uL (ref 4.0–10.5)

## 2017-09-24 LAB — COMPREHENSIVE METABOLIC PANEL
ALBUMIN: 3.7 g/dL (ref 3.5–5.0)
ALK PHOS: 48 U/L (ref 38–126)
ALT: 20 U/L (ref 14–54)
AST: 27 U/L (ref 15–41)
Anion gap: 10 (ref 5–15)
BUN: 15 mg/dL (ref 6–20)
CALCIUM: 9.1 mg/dL (ref 8.9–10.3)
CO2: 23 mmol/L (ref 22–32)
CREATININE: 0.87 mg/dL (ref 0.44–1.00)
Chloride: 103 mmol/L (ref 101–111)
GFR calc Af Amer: >60 mL/min (ref >60)
GFR calc non Af Amer: >60 mL/min (ref >60)
Glucose, Bld: 112 mg/dL — ABNORMAL HIGH (ref 65–99)
Potassium: 3.7 mmol/L (ref 3.5–5.1)
SODIUM: 136 mmol/L (ref 135–145)
Total Bilirubin: 0.8 mg/dL (ref 0.3–1.2)
Total Protein: 7 g/dL (ref 6.5–8.1)

## 2017-09-24 LAB — TROPONIN I: Troponin I: <0.03 ng/mL

## 2017-09-24 NOTE — ED Provider Notes (Signed)
[facility] EMERGENCY DEPARTMENT Provider Note   CSN: [id number] Arrival date & time: 09/24/17  1022     History   Chief Complaint Chief Complaint  Patient presents with  . Shortness of Breath    HPI Kimberly Irwin is a 72 y.o. female.  HPI   72 year old female former smoker with history of moderate COPD, emphysema, and sarcoidosis, followed by pulmonology and on 2 L of oxygen at home with walking test showing patient with 3 L requirement with ambulation, presents with concern for shortness of breath.  Was going to PCP office today and felt dyspnea on way into appointment.  Reports she does not normally walk that far.  Was on 2L O2 and had to rest several times on the way into the appointment. By the time she went to see her PCP she appeared that she had more dyspnea and Dr. Charlett Blake sent her downstairs for further evaluation.    Has history of SOB, reports feels like her shortness of breath has been getting worse since she was started on O2 24/7--reports before she was just using it at night and felt better, but when it was recommended to use it all the time it worsened.  Taking symbicort and not sure if it is helping.  Has been worse for the last 2 weeks, but feels like it was even worse today on her way to the appointment.  Not having other symptoms with the dyspnea.  Reports right now she feels at baseline, and that her dyspnea was only worse when she was walking into the appointment today, but acknowledges this is a distance she never walks at home.  Reports has chronic cough, no change in cough.  Reports she had bronchitis one month ago, took some medication which helped but cough has been nagging since then.  Coughing up clear sputum for about one month.  Has nebulizer at home told to use it as needed-last used it one to two weeks ago.  No known fevers.  No chest pain.  No leg pain or swelling.  Dyspnea worse with ambulation.  Not worse laying down flat. No diaphoresis.  No  syncope.  Reports congestion from allergies.  Reports recent negative cardiac work up with Dr. Wynonia Lawman.  Past Medical History:  Diagnosis Date  . Acute hip pain 10/26/2012  . Bronchitis 09/04/2016  . Bursitis of left hip 10/26/2012  . Cardiomegaly 07/13/2016  . Chronic low back pain   . Coronary artery calcification 07/03/2016  . DDD (degenerative disc disease)    L3-4 with facet arthropathy and stenosis  . Degenerative spondylolisthesis    L4-5 grade 1 with stenosis  . GERD (gastroesophageal reflux disease)   . Heart murmur    as an infant  . History of hiatal hernia   . Hyperlipidemia   . Hypertension   . Hypothyroidism   . Hypothyroidism 10/28/2015  . Hypoxia, sleep related 10/21/2016  . Pain in joint, lower leg 10/26/2012   left   . Pneumonia    as a child several times.   . Sarcoidosis   . SOB (shortness of breath) 04/03/2016  . Tremors of nervous system     Patient Active Problem List   Diagnosis Date Noted  . Hypoxia, sleep related 10/21/2016  . Cardiomegaly 07/13/2016  . Coronary artery calcification 07/03/2016  . COPD (chronic obstructive pulmonary disease) ([place]) 07/01/2016  . Hypersomnia 07/01/2016  . Chronic respiratory failure with hypoxia ([place]) 05/20/2016  . Dermatitis 04/14/2016  . SOB (shortness  of breath) 04/03/2016  . Hypothyroidism 10/28/2015  . Lumbar compression fracture ([place]) 09/14/2014  . Medicare annual wellness visit, subsequent 04/27/2014  . Obesity   . S/P left THA, AA 06/28/2013  . Decreased visual acuity 02/19/2013  . Hyperglycemia 04/04/2011  . Chronic pain syndrome 01/20/2011  . Depression 08/10/2008  . GERD 04/26/2008  . Lumbar disc disease 05/31/2007  . 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 Hyperlipidemia 05/29/2007  . Tremor   . Essential hypertension     Past Surgical History:  Procedure Laterality Date  . ANTERIOR LAT LUMBAR FUSION Left 08/29/2014   Procedure: EXTREME LEFT LATERAL INTERBODY FUSION LUMBAR TWO-THREE LATERAL PLATE;   Surgeon: Charlie Pitter, MD;  Location: [facility];  Service: Neurosurgery;  Laterality: Left;  . BACK SURGERY  01/15/09   L3-4 and L4-5 decompressive laminectomy with bilateral L3, L4, and L5 decompressive foraminotomies, more than it would be required for simple interbody fusion alone.  [facility] BACK SURGERY  01/15/09   L3-4 and L4-5 posterior lumbar interbody fusion utilizing tanget interbody allograft wedge, Telamon interbody PEEk cage, and local autografting.  [facility] BACK SURGERY  01/15/09   L3, L4, and L5 posterolateral arthrodesis using segmental pedicle screw fixation and local autografting.  [facility] CARDIAC CATHETERIZATION    . CARPAL TUNNEL RELEASE Bilateral   . COLONOSCOPY    . ESOPHAGOGASTRODUODENOSCOPY    . EYE SURGERY     lens implant  . JOINT REPLACEMENT  09/13/09   Right Hip, Dr. Alvan Dame  . KYPHOPLASTY N/A 09/14/2014   Procedure: Lumbar Two Kyphoplasty/Vertebroplasty;  Surgeon: Charlie Pitter, MD;  Location: [facility];  Service: Neurosurgery;  Laterality: N/A;  Lumbar Two Kyphoplasty/Vertebroplasty  . THYROIDECTOMY    . TOTAL HIP ARTHROPLASTY Left 06/28/2013   Procedure: LEFT TOTAL  HIP ARTHROPLASTY ANTERIOR APPROACH;  Surgeon: Mauri Pole, MD;  Location: [facility];  Service: Orthopedics;  Laterality: Left;    OB History    No data available       Home Medications    Prior to Admission medications   Medication Sig Start Date End Date Taking? Authorizing Provider  albuterol (PROVENTIL HFA;VENTOLIN HFA) 108 (90 Base) MCG/ACT inhaler Inhale 2 puffs into the lungs every 6 (six) hours as needed for wheezing or shortness of breath. 07/01/16   de Dios, [place] A, MD  albuterol (PROVENTIL) (2.5 MG/3ML) 0.083% nebulizer solution Take 3 mLs (2.5 mg total) by nebulization every 6 (six) hours as needed for wheezing or shortness of breath. 03/30/17   Mosie Lukes, MD  aspirin EC 81 MG tablet Take 81 mg by mouth daily.    [provider]  atenolol (TENORMIN) 25 MG tablet Take 1 tablet (25  mg total) by mouth at bedtime. Take the 50 mg Atenolol in am and the 25 mg in pm 10/21/16   Mosie Lukes, MD  atenolol (TENORMIN) 50 MG tablet TAKE 1 TABLET (50 MG TOTAL) BY MOUTH DAILY. 02/23/17   Mosie Lukes, MD  BESIVANCE 0.6 % SUSP Reported on 07/23/2015 12/18/14   [provider]  budesonide-formoterol (SYMBICORT) 160-4.5 MCG/ACT inhaler Inhale 2 puffs into the lungs 2 (two) times daily. 06/04/16   de Dios, [place] A, MD  Calcium Carbonate-Vit D-Min (CALCIUM 1200 PO) Take 1,200 mg by mouth daily.     [provider]  Cholecalciferol (VITAMIN D) 2000 UNITS tablet Take 2,000 Units by mouth daily.    [provider]  Cinnamon 500 MG capsule Take 2,000 mg by mouth  daily.      [provider]  citalopram (CELEXA) 10 MG tablet TAKE 1 TABLET BY MOUTH EVERY DAY 09/15/17   Mosie Lukes, MD  Coenzyme Q10 200 MG capsule Take 200 mg by mouth daily.    [provider]  doxycycline (VIBRA-TABS) 100 MG tablet Take 1 tablet (100 mg total) by mouth 2 (two) times daily. 08/14/17   Mosie Lukes, MD  fexofenadine-pseudoephedrine (ALLEGRA-D 24) 180-240 MG 24 hr tablet Take 1 tablet by mouth daily.    [provider]  gabapentin (NEURONTIN) 300 MG capsule Take 1 capsule (300 mg total) by mouth at bedtime. 02/17/17   Mosie Lukes, MD  Glucosamine HCl (GLUCOSAMINE PO) Take by mouth daily.    [provider]  hydrochlorothiazide (HYDRODIURIL) 25 MG tablet TAKE 1 TABLET (25 MG TOTAL) BY MOUTH DAILY. 09/14/17   Mosie Lukes, MD  ketoconazole (NIZORAL) 2 % shampoo Apply 1 application topically 2 (two) times a week. 10/23/16   Mosie Lukes, MD  levothyroxine (SYNTHROID, LEVOTHROID) 112 MCG tablet TAKE 1 TABLET (112 MCG TOTAL) BY MOUTH DAILY BEFORE BREAKFAST. 07/20/17   Mosie Lukes, MD  mometasone (NASONEX) 50 MCG/ACT nasal spray Place 2 sprays daily as needed into the nose. 06/19/17   Mosie Lukes, MD  mupirocin ointment (BACTROBAN) 2 %  Apply twice daily to bridge of the nose. 01/04/15   Saguier, Percell Miller, PA-C  Omega-3 Fatty Acids (FISH OIL) 1200 MG CAPS Take 2,400 mg by mouth daily.     [provider]  omeprazole (PRILOSEC) 40 MG capsule TAKE 1 CAPSULE (40 MG TOTAL) BY MOUTH DAILY. 06/08/17   Mosie Lukes, MD  potassium chloride (KLOR-CON 10) 10 MEQ tablet Take 1 tablet (10 mEq total) by mouth daily. 08/05/17   Mosie Lukes, MD  ranitidine (ZANTAC) 150 MG tablet Take 1-2 tablets (150-300 mg total) by mouth at bedtime as needed for heartburn. 04/03/16   Mosie Lukes, MD  rosuvastatin (CRESTOR) 20 MG tablet TAKE 1 TABLET (20 MG TOTAL) BY MOUTH DAILY. 07/10/17   Mosie Lukes, MD  traMADol (ULTRAM) 50 MG tablet Take 2 tablets (100 mg total) by mouth every 6 (six) hours as needed for moderate pain or severe pain. 12/04/14   Mosie Lukes, MD  vitamin B-12 (CYANOCOBALAMIN) 1000 MCG tablet Take 1,000 mcg by mouth daily.    [provider]    Family History Family History  Problem Relation Age of Onset  . Cancer Mother 27       Breast  . Heart disease Mother   . Hypertension Mother   . Hyperlipidemia Mother   . Heart attack Mother   . Asthma Maternal Grandmother     Social History Social History   Tobacco Use  . Smoking status: Former Smoker    Packs/day: 0.50    Years: 32.00    Pack years: 16.00    Types: Cigarettes    Last attempt to quit: 07/22/1994    Years since quitting: 23.1  . Smokeless tobacco: Never Used  Substance Use Topics  . Alcohol use: No    Alcohol/week: 0.0 oz  . Drug use: No     Allergies   Lipitor [atorvastatin]; Penicillins; Metronidazole; Pregabalin; and Simvastatin   Review of Systems Review of Systems  Constitutional: Negative for fever.  HENT: Negative for sore throat.   Eyes: Negative for visual disturbance.  Respiratory: Positive for cough (chronic) and shortness of breath. Negative for wheezing (no more  than usual).   Cardiovascular: Negative for chest  pain and leg swelling.  Gastrointestinal: Negative for abdominal pain, nausea and vomiting.  Genitourinary: Negative for difficulty urinating.  Musculoskeletal: Negative for back pain and neck pain.  Skin: Negative for rash.  Neurological: Negative for syncope and headaches.     Physical Exam Updated Vital Signs BP 134/74 (BP Location: Right Arm)   Pulse 65   Temp 97.6 F (36.4 C) (Oral)   Resp 18   Wt 76.2 kg (168 lb)   SpO2 95%   BMI 29.76 kg/m   Physical Exam  Constitutional: She is oriented to person, place, and time. She appears well-developed and well-nourished. No distress.  HENT:  Head: Normocephalic and atraumatic.  Eyes: Conjunctivae and EOM are normal.  Neck: Normal range of motion.  Cardiovascular: Normal rate, regular rhythm, normal heart sounds and intact distal pulses. Exam reveals no gallop and no friction rub.  No murmur heard. Pulmonary/Chest: Effort normal and breath sounds normal. No respiratory distress. She has no wheezes. She has no rales.  Abdominal: Soft. She exhibits no distension. There is no tenderness. There is no guarding.  Musculoskeletal: She exhibits no edema or tenderness.  Neurological: She is alert and oriented to person, place, and time.  Skin: Skin is warm and dry. No rash noted. She is not diaphoretic. No erythema.  Nursing note and vitals reviewed.    ED Treatments / Results  Labs (all labs ordered are listed, but only abnormal results are displayed) Labs Reviewed  CBC WITH DIFFERENTIAL/PLATELET - Abnormal; Notable for the following components:      Result Value   Neutro Abs 8.2 (*)    All other components within normal limits  COMPREHENSIVE METABOLIC PANEL - Abnormal; Notable for the following components:   Glucose, Bld 112 (*)    All other components within normal limits  TROPONIN I    EKG  EKG Interpretation  Date/Time:  Thursday September 24 2017 10:37:19 EST Ventricular Rate:  66 PR Interval:    QRS Duration: 95 QT  Interval:  417 QTC Calculation: 437 R Axis:   65 Text Interpretation:  Sinus rhythm Low voltage, precordial leads Abnormal T, consider ischemia, anterior leads No significant change since last tracing Confirmed by Gareth Morgan [phone]) on 09/24/2017 10:40:09 AM       Radiology Dg Chest 2 View  Result Date: 09/24/2017 CLINICAL DATA:  Increase shortness of breath, COPD, sarcoidosis EXAM: CHEST  2 VIEW COMPARISON:  Chest x-ray of 09/10/2017 FINDINGS: No active infiltrate or effusion is seen. Mediastinal and hilar contours are unremarkable, with no evidence of mediastinal or hilar adenopathy by chest x-ray. The heart is mildly enlarged and stable. There are degenerative changes in the mid to lower thoracic spine. IMPRESSION: 1. No active lung disease. 2. Stable mild cardiomegaly. Electronically Signed   By: [name] M.D.   On: 09/24/2017 11:09    Procedures Procedures (including critical care time)  Medications Ordered in ED Medications - No data to display   Initial Impression / Assessment and Plan / ED Course  I have reviewed the triage vital signs and the nursing notes.  Pertinent labs & imaging results that were available during my care of the patient were reviewed by me and considered in my medical decision making (see chart for details).    71 year old female former smoker with history of moderate COPD, emphysema, and sarcoidosis, followed by pulmonology and on 2 L of oxygen at home with walking test showing patient with 3  L requirement with ambulation, presents with concern for shortness of breath while walking into her PCP appointment today.  CXR without pneumonia, pneumothorax, pulmonary edema. No chest pain, normal troponin, unchanged ECG, doubt ACS.  Given pt without increased dyspnea from baseline, no acute change, no CP, no hypoxia from baseline, have low suspicion for PE. No sign of anemia or electrolyte abnormality.  She does not have wheezing on exam and doubt COPD  exacerbation.  Suspect dyspnea related to patient exerting herself significantly more than baseline on walk into office, and wearing 2L of O2 rather than 3L. She has not had dyspnea at rest in the ED.  Recommend continued pulmonology and PCP follow up and discussed reasons to return in detail.    Final Clinical Impressions(s) / ED Diagnoses   Final diagnoses:  Exertional dyspnea  Chronic lung disease    ED Discharge Orders    None       Gareth Morgan, MD 09/24/17 2308

## 2017-09-24 NOTE — Discharge Instructions (Signed)
Increase your O2 to 3L with activity given recent ambulation test in Pulmonology office, and continue 2L of Oxygen when you are resting.

## 2017-09-24 NOTE — ED Triage Notes (Signed)
Patient states that she was here to see her MD for a "routine check up" - the patient reports that she has had increasing SOb since she was upstairs and has to sit down frequently because of it.

## 2017-09-29 ENCOUNTER — Encounter (INDEPENDENT_AMBULATORY_CARE_PROVIDER_SITE_OTHER): Payer: Medicare Other | Admitting: Ophthalmology

## 2017-09-29 DIAGNOSIS — H34832 Tributary (branch) retinal vein occlusion, left eye, with macular edema: Secondary | ICD-10-CM

## 2017-09-29 DIAGNOSIS — I10 Essential (primary) hypertension: Secondary | ICD-10-CM | POA: Diagnosis not present

## 2017-09-29 DIAGNOSIS — H43813 Vitreous degeneration, bilateral: Secondary | ICD-10-CM | POA: Diagnosis not present

## 2017-09-29 DIAGNOSIS — H35033 Hypertensive retinopathy, bilateral: Secondary | ICD-10-CM | POA: Diagnosis not present

## 2017-09-30 ENCOUNTER — Encounter: Payer: Self-pay | Admitting: Family Medicine

## 2017-10-05 ENCOUNTER — Other Ambulatory Visit: Payer: Self-pay | Admitting: Family Medicine

## 2017-10-12 ENCOUNTER — Encounter: Payer: Self-pay | Admitting: Pulmonary Disease

## 2017-10-12 ENCOUNTER — Ambulatory Visit (INDEPENDENT_AMBULATORY_CARE_PROVIDER_SITE_OTHER): Payer: Medicare Other | Admitting: Pulmonary Disease

## 2017-10-12 ENCOUNTER — Telehealth: Payer: Self-pay | Admitting: Family Medicine

## 2017-10-12 VITALS — BP 103/70 | HR 63 | Ht 64.0 in | Wt 167.0 lb

## 2017-10-12 DIAGNOSIS — I270 Primary pulmonary hypertension: Secondary | ICD-10-CM

## 2017-10-12 DIAGNOSIS — J9611 Chronic respiratory failure with hypoxia: Secondary | ICD-10-CM

## 2017-10-12 DIAGNOSIS — J439 Emphysema, unspecified: Secondary | ICD-10-CM | POA: Diagnosis not present

## 2017-10-12 DIAGNOSIS — D869 Sarcoidosis, unspecified: Secondary | ICD-10-CM | POA: Diagnosis not present

## 2017-10-12 NOTE — Patient Instructions (Signed)
Centrilobular emphysema: Continue Symbicort 2 puffs twice a day Continue albuterol as needed We will repeat a lung function test  Worsening shortness of breath: We will repeat a high-resolution CT scan of the chest and lung function test  History of sarcoidosis: As above, repeating lung function test and CT scan of the chest  Pulmonary hypertension: This term means that you have high blood pressure in the lungs, the 2017 echocardiogram suggested this. At the lung function test and high-resolution CT scan of the chest did not show any clear worsening of the emphysema or sarcoidosis then we will recommend that you have a right heart catheterization  Chronic respiratory failure with hypoxemia: Use 2L O2 at rest and 6 L pulse when walking We will arrange for a formal titrating walk in our office with a pulse portable oxygen concentrator to make sure we prescribed the right amount  Follow up in 2-3 weeks or sooner if needed

## 2017-10-12 NOTE — Progress Notes (Signed)
Subjective:   PATIENT ID: Kimberly Irwin GENDER: female DOB: January 08, 1946, MRN: [id number]   Synopsis: Former patient of Dr. Joya Gaskins and Dr. Corrie Dandy who has sarcoidosis and centrilobular emphysema.  HPI  Chief Complaint  Patient presents with  . Follow-up    former AD pt being treated for emphysema.  c/o worsening sob with exertion.      Kimberly Irwin says that she has had progressive worsening shortness of breath over the last several months.  She says she can only walk about 5-10 feet without getting short of breath.  She has been using her oxygen more regularly but she says it does not help her.  She has been using up to 3 L pulse flow when exerting herself but she still feels short of breath and she notes that the small oxygen tank she is using run out quickly.  She would like to have a portable oxygen concentrator but she wants to know what level of oxygen she needs to be on prior to this.  She says that she is not coughing does not produce any mucus.  She is taking Symbicort 2 puffs twice a day but she says it does not help her at all.  She says that she will occasionally use the albuterol nebulizer when she feels short of breath and this helps.  She denies chest tightness or wheezing.  She says that she had a cardiology workup a few years ago and was told that everything was normal.  She denies leg swelling or orthopnea.  She says there is not been any recent changes in her home environment.  Specifically, there is no new mold mildew dust.  She says she has some dogs with she has had them for many years and she does not have allergies.  Past Medical History:  Diagnosis Date  . Acute hip pain 10/26/2012  . Bronchitis 09/04/2016  . Bursitis of left hip 10/26/2012  . Cardiomegaly 07/13/2016  . Chronic low back pain   . Coronary artery calcification 07/03/2016  . DDD (degenerative disc disease)    L3-4 with facet arthropathy and stenosis  . Degenerative spondylolisthesis    L4-5 grade 1 with  stenosis  . GERD (gastroesophageal reflux disease)   . Heart murmur    as an infant  . History of hiatal hernia   . Hyperlipidemia   . Hypertension   . Hypothyroidism   . Hypothyroidism 10/28/2015  . Hypoxia, sleep related 10/21/2016  . Pain in joint, lower leg 10/26/2012   left   . Pneumonia    as a child several times.   . Sarcoidosis   . SOB (shortness of breath) 04/03/2016  . Tremors of nervous system      Family History  Problem Relation Age of Onset  . Cancer Mother 42       Breast  . Heart disease Mother   . Hypertension Mother   . Hyperlipidemia Mother   . Heart attack Mother   . Asthma Maternal Grandmother      Social History   Socioeconomic History  . Marital status: Widowed    Spouse name: Not on file  . Number of children: 2  . Years of education: Not on file  . Highest education level: Not on file  Social Needs  . Financial resource strain: Not on file  . Food insecurity - worry: Not on file  . Food insecurity - inability: Not on file  . Transportation needs - medical: Not on  file  . Transportation needs - non-medical: Not on file  Occupational History  . Not on file  Tobacco Use  . Smoking status: Former Smoker    Packs/day: 0.50    Years: 32.00    Pack years: 16.00    Types: Cigarettes    Last attempt to quit: 07/22/1994    Years since quitting: 23.2  . Smokeless tobacco: Never Used  Substance and Sexual Activity  . Alcohol use: No    Alcohol/week: 0.0 oz  . Drug use: No  . Sexual activity: No  Other Topics Concern  . Not on file  Social History Narrative   Married 66 years and has 2 children (2 daughters)   Alcohol Use - no   Former Smoker - she quit 10 years ago, started when she was 61 and has had varying level of use of tobacco products from 1/2 pack to 3 packs per day.     Allergies  Allergen Reactions  . Lipitor [Atorvastatin] Swelling  . Penicillins Swelling  . Metronidazole Swelling  . Pregabalin Other (See Comments)     REACTION: Somnolence and dizziness  . Simvastatin Other (See Comments)    Leg pain     Outpatient Medications Prior to Visit  Medication Sig Dispense Refill  . albuterol (PROVENTIL HFA;VENTOLIN HFA) 108 (90 Base) MCG/ACT inhaler Inhale 2 puffs into the lungs every 6 (six) hours as needed for wheezing or shortness of breath. 1 Inhaler 6  . albuterol (PROVENTIL) (2.5 MG/3ML) 0.083% nebulizer solution Take 3 mLs (2.5 mg total) by nebulization every 6 (six) hours as needed for wheezing or shortness of breath. 30 mL 4  . aspirin EC 81 MG tablet Take 81 mg by mouth daily.    Marland Kitchen atenolol (TENORMIN) 25 MG tablet TAKE 1 TABLET (25 MG TOTAL) BY MOUTH AT BEDTIME. TAKE THE 50 MG ATENOLOL IN AM AND THE 25 MG IN PM 90 tablet 3  . atenolol (TENORMIN) 50 MG tablet TAKE 1 TABLET (50 MG TOTAL) BY MOUTH DAILY. 90 tablet 1  . BESIVANCE 0.6 % SUSP Reported on 07/23/2015  12  . budesonide-formoterol (SYMBICORT) 160-4.5 MCG/ACT inhaler Inhale 2 puffs into the lungs 2 (two) times daily. 1 Inhaler 5  . Calcium Carbonate-Vit D-Min (CALCIUM 1200 PO) Take 1,200 mg by mouth daily.     . Cholecalciferol (VITAMIN D) 2000 UNITS tablet Take 2,000 Units by mouth daily.    . Cinnamon 500 MG capsule Take 2,000 mg by mouth daily.      . citalopram (CELEXA) 10 MG tablet TAKE 1 TABLET BY MOUTH EVERY DAY 90 tablet 1  . Coenzyme Q10 200 MG capsule Take 200 mg by mouth daily.    . fexofenadine-pseudoephedrine (ALLEGRA-D 24) 180-240 MG 24 hr tablet Take 1 tablet by mouth daily.    Marland Kitchen gabapentin (NEURONTIN) 300 MG capsule Take 1 capsule (300 mg total) by mouth at bedtime. 90 capsule 2  . Glucosamine HCl (GLUCOSAMINE PO) Take by mouth daily.    . hydrochlorothiazide (HYDRODIURIL) 25 MG tablet TAKE 1 TABLET (25 MG TOTAL) BY MOUTH DAILY. 90 tablet 1  . ketoconazole (NIZORAL) 2 % shampoo Apply 1 application topically 2 (two) times a week. 120 mL 03  . levothyroxine (SYNTHROID, LEVOTHROID) 112 MCG tablet TAKE 1 TABLET (112 MCG TOTAL) BY MOUTH  DAILY BEFORE BREAKFAST. 90 tablet 1  . mometasone (NASONEX) 50 MCG/ACT nasal spray Place 2 sprays daily as needed into the nose. 17 g 12  . mupirocin ointment (BACTROBAN) 2 % Apply  twice daily to bridge of the nose. 22 g 0  . Omega-3 Fatty Acids (FISH OIL) 1200 MG CAPS Take 2,400 mg by mouth daily.     Marland Kitchen omeprazole (PRILOSEC) 40 MG capsule TAKE 1 CAPSULE (40 MG TOTAL) BY MOUTH DAILY. 90 capsule 1  . potassium chloride (KLOR-CON 10) 10 MEQ tablet Take 1 tablet (10 mEq total) by mouth daily. 30 tablet 3  . ranitidine (ZANTAC) 150 MG tablet Take 1-2 tablets (150-300 mg total) by mouth at bedtime as needed for heartburn. 60 tablet 2  . rosuvastatin (CRESTOR) 20 MG tablet TAKE 1 TABLET (20 MG TOTAL) BY MOUTH DAILY. 90 tablet 2  . traMADol (ULTRAM) 50 MG tablet Take 2 tablets (100 mg total) by mouth every 6 (six) hours as needed for moderate pain or severe pain. 120 tablet 0  . vitamin B-12 (CYANOCOBALAMIN) 1000 MCG tablet Take 1,000 mcg by mouth daily.    Marland Kitchen doxycycline (VIBRA-TABS) 100 MG tablet Take 1 tablet (100 mg total) by mouth 2 (two) times daily. (Patient not taking: Reported on 10/12/2017) 14 tablet 0   No facility-administered medications prior to visit.     Review of Systems  Constitutional: Negative for chills, diaphoresis and malaise/fatigue.  HENT: Negative for congestion and ear discharge.   Respiratory: Positive for shortness of breath. Negative for cough, sputum production, wheezing and stridor.   Cardiovascular: Negative for chest pain, palpitations, claudication and leg swelling.      Objective:  Physical Exam   Vitals:   10/12/17 1546  BP: 103/70  Pulse: 63  SpO2: 97%  Weight: 167 lb (75.8 kg)  Height: _0  (1.626 m)   3Lpm Pulse at rest  Walked 500 feet on 4Lpm pulse oxygen flow: O2 would drop as low as 80-81% though the majority of the measurements stayed around 90-91%  Gen: chronically ill appearing HENT: OP clear, TM's clear, neck supple PULM: CTA B, normal  percussion CV: RRR, no mgr, trace edema GI: BS+, soft, nontender Derm: no cyanosis or rash Psyche: normal mood and affect   CBC    Component Value Date/Time   WBC 10.4 09/24/2017 1126   RBC 4.65 09/24/2017 1126   HGB 14.2 09/24/2017 1126   HCT 40.9 09/24/2017 1126   PLT 163 09/24/2017 1126   MCV 88.0 09/24/2017 1126   MCH 30.5 09/24/2017 1126   MCHC 34.7 09/24/2017 1126   RDW 13.8 09/24/2017 1126   LYMPHSABS 1.3 09/24/2017 1126   MONOABS 0.8 09/24/2017 1126   EOSABS 0.1 09/24/2017 1126   BASOSABS 0.0 09/24/2017 1126     Chest imaging: CT chest October 2017 showed no PE . Mild emphysematous changes.  Images independently reviewed by me showing some air trapping and mosaicism.  Some cysts noted in the lower lobes.  PFT: PFT (05/2016) Ratio 70% FEV1 1.59 72%, DLCO 35% (flow volume loop consistent with airflow obstruction)  Labs:  Path:  Echo: 2-D echo on 04/09/2016 showed an EF of 50-38%, grade 1 diastolic dysfunction. Pulmonary artery pressure 44 mmHg  Heart Catheterization:  Exhaled NO: FeNO is 20ppm 05/2016   Other: Stress Myoview 2011 neg for ischemia .     Assessment & Plan:   Chronic respiratory failure with hypoxia (HCC) - Plan: CT Chest High Resolution, Pulmonary function test  Pulmonary emphysema, unspecified emphysema type ([place])  Sarcoidosis stage I  Primary pulmonary hypertension ([place])  Discussion: Ms. Gama has progressive shortness of breath with significant chronic respiratory failure with hypoxemia in the setting of 3 objective abnormalities:  Centrilobular emphysema, known sarcoidosis, and pulmonary hypertension.  The CT scan of her chest from 2017 did not show significant pulmonary parenchymal disease other than some mosaicism and mild centrilobular emphysema.  Further, lung function testing around that time showed mostly small airways obstruction.  Echocardiogram in 2017 showed pulmonary hypertension without other valvular abnormalities.  I  explained to her that it may be that she just has enough sarcoid, and enough centrilobular emphysema and enough pulmonary hypertension to cause her to feel significantly short of breath and have worsening oxygenation.  However, given the worsening symptoms in the last 12 months I think it is worthwhile to get a CT scan of the chest and a lung function test to try to see if there is one of these 3 conditions which is predominantly worsening.  If there is no evidence of worsening pulmonary parenchymal disease or worsening lung function testing then I think the next step would be to have a right heart catheterization to assess for pulmonary hypertension.  She has not had benefit from bronchodilators so there may be value in treating her with pulmonary vasodilators.  Plan: Centrilobular emphysema: Continue Symbicort 2 puffs twice a day Continue albuterol as needed We will repeat a lung function test  Worsening shortness of breath: We will repeat a high-resolution CT scan of the chest and lung function test  History of sarcoidosis: As above, repeating lung function test and CT scan of the chest  Pulmonary hypertension: This term means that you have high blood pressure in the lungs, the 2017 echocardiogram suggested this. At the lung function test and high-resolution CT scan of the chest did not show any clear worsening of the emphysema or sarcoidosis then we will recommend that you have a right heart catheterization  Chronic respiratory failure with hypoxemia: Use 2L O2 at rest and 6 L pulse when walking We will arrange for a formal titrating walk in our office with a pulse portable oxygen concentrator to make sure we prescribed the right amount  Follow up in 2-3 weeks or sooner if needed  30 minutes spent with the patient and her daughter, 45 minute visit   Current Outpatient Medications:  .  albuterol (PROVENTIL HFA;VENTOLIN HFA) 108 (90 Base) MCG/ACT inhaler, Inhale 2 puffs into the lungs  every 6 (six) hours as needed for wheezing or shortness of breath., Disp: 1 Inhaler, Rfl: 6 .  albuterol (PROVENTIL) (2.5 MG/3ML) 0.083% nebulizer solution, Take 3 mLs (2.5 mg total) by nebulization every 6 (six) hours as needed for wheezing or shortness of breath., Disp: 30 mL, Rfl: 4 .  aspirin EC 81 MG tablet, Take 81 mg by mouth daily., Disp: , Rfl:  .  atenolol (TENORMIN) 25 MG tablet, TAKE 1 TABLET (25 MG TOTAL) BY MOUTH AT BEDTIME. TAKE THE 50 MG ATENOLOL IN AM AND THE 25 MG IN PM, Disp: 90 tablet, Rfl: 3 .  atenolol (TENORMIN) 50 MG tablet, TAKE 1 TABLET (50 MG TOTAL) BY MOUTH DAILY., Disp: 90 tablet, Rfl: 1 .  BESIVANCE 0.6 % SUSP, Reported on 07/23/2015, Disp: , Rfl: 12 .  budesonide-formoterol (SYMBICORT) 160-4.5 MCG/ACT inhaler, Inhale 2 puffs into the lungs 2 (two) times daily., Disp: 1 Inhaler, Rfl: 5 .  Calcium Carbonate-Vit D-Min (CALCIUM 1200 PO), Take 1,200 mg by mouth daily. , Disp: , Rfl:  .  Cholecalciferol (VITAMIN D) 2000 UNITS tablet, Take 2,000 Units by mouth daily., Disp: , Rfl:  .  Cinnamon 500 MG capsule, Take 2,000 mg by mouth daily.  ,  Disp: , Rfl:  .  citalopram (CELEXA) 10 MG tablet, TAKE 1 TABLET BY MOUTH EVERY DAY, Disp: 90 tablet, Rfl: 1 .  Coenzyme Q10 200 MG capsule, Take 200 mg by mouth daily., Disp: , Rfl:  .  fexofenadine-pseudoephedrine (ALLEGRA-D 24) 180-240 MG 24 hr tablet, Take 1 tablet by mouth daily., Disp: , Rfl:  .  gabapentin (NEURONTIN) 300 MG capsule, Take 1 capsule (300 mg total) by mouth at bedtime., Disp: 90 capsule, Rfl: 2 .  Glucosamine HCl (GLUCOSAMINE PO), Take by mouth daily., Disp: , Rfl:  .  hydrochlorothiazide (HYDRODIURIL) 25 MG tablet, TAKE 1 TABLET (25 MG TOTAL) BY MOUTH DAILY., Disp: 90 tablet, Rfl: 1 .  ketoconazole (NIZORAL) 2 % shampoo, Apply 1 application topically 2 (two) times a week., Disp: 120 mL, Rfl: 03 .  levothyroxine (SYNTHROID, LEVOTHROID) 112 MCG tablet, TAKE 1 TABLET (112 MCG TOTAL) BY MOUTH DAILY BEFORE BREAKFAST.,  Disp: 90 tablet, Rfl: 1 .  mometasone (NASONEX) 50 MCG/ACT nasal spray, Place 2 sprays daily as needed into the nose., Disp: 17 g, Rfl: 12 .  mupirocin ointment (BACTROBAN) 2 %, Apply twice daily to bridge of the nose., Disp: 22 g, Rfl: 0 .  Omega-3 Fatty Acids (FISH OIL) 1200 MG CAPS, Take 2,400 mg by mouth daily. , Disp: , Rfl:  .  omeprazole (PRILOSEC) 40 MG capsule, TAKE 1 CAPSULE (40 MG TOTAL) BY MOUTH DAILY., Disp: 90 capsule, Rfl: 1 .  potassium chloride (KLOR-CON 10) 10 MEQ tablet, Take 1 tablet (10 mEq total) by mouth daily., Disp: 30 tablet, Rfl: 3 .  ranitidine (ZANTAC) 150 MG tablet, Take 1-2 tablets (150-300 mg total) by mouth at bedtime as needed for heartburn., Disp: 60 tablet, Rfl: 2 .  rosuvastatin (CRESTOR) 20 MG tablet, TAKE 1 TABLET (20 MG TOTAL) BY MOUTH DAILY., Disp: 90 tablet, Rfl: 2 .  traMADol (ULTRAM) 50 MG tablet, Take 2 tablets (100 mg total) by mouth every 6 (six) hours as needed for moderate pain or severe pain., Disp: 120 tablet, Rfl: 0 .  vitamin B-12 (CYANOCOBALAMIN) 1000 MCG tablet, Take 1,000 mcg by mouth daily., Disp: , Rfl:

## 2017-10-13 ENCOUNTER — Other Ambulatory Visit: Payer: Self-pay | Admitting: *Deleted

## 2017-10-13 MED ORDER — ATENOLOL 50 MG PO TABS: ORAL_TABLET | ORAL | 1 refills | Status: DC

## 2017-10-13 NOTE — Telephone Encounter (Signed)
Left message for patient to call back.

## 2017-10-13 NOTE — Telephone Encounter (Signed)
Pt  Requesting  Rx  Refill for the    50  Mg  Tenormin  -  Spoke  With    Ebony Hail  The  Pharmacist   At  [facility]  On [address]  The  Patient  Received  The  25  Mg  Tenormin  Yesterday  But  Needs  The  50 mg  Mg .  Rx  Filled  Per protocall   Pt  Advised

## 2017-10-13 NOTE — Telephone Encounter (Signed)
Patient would like to know why this was not approved. Patient has OV on Monday. Please advise. atenolol (TENORMIN) 50 MG tablet

## 2017-10-19 ENCOUNTER — Encounter: Payer: Self-pay | Admitting: Family Medicine

## 2017-10-19 ENCOUNTER — Ambulatory Visit (INDEPENDENT_AMBULATORY_CARE_PROVIDER_SITE_OTHER): Payer: Medicare Other | Admitting: Family Medicine

## 2017-10-19 ENCOUNTER — Ambulatory Visit (HOSPITAL_BASED_OUTPATIENT_CLINIC_OR_DEPARTMENT_OTHER)
Admission: RE | Admit: 2017-10-19 | Discharge: 2017-10-19 | Disposition: A | Discharge status: Home / Self Care | Payer: Medicare Other | Source: Ambulatory Visit | Attending: Pulmonary Disease | Admitting: Pulmonary Disease

## 2017-10-19 ENCOUNTER — Ambulatory Visit (INDEPENDENT_AMBULATORY_CARE_PROVIDER_SITE_OTHER): Payer: Medicare Other | Admitting: *Deleted

## 2017-10-19 ENCOUNTER — Ambulatory Visit (INDEPENDENT_AMBULATORY_CARE_PROVIDER_SITE_OTHER): Payer: Medicare Other | Admitting: Pulmonary Disease

## 2017-10-19 VITALS — BP 102/58 | HR 56 | Temp 97.8°F | Resp 18 | Wt 169.6 lb

## 2017-10-19 DIAGNOSIS — R739 Hyperglycemia, unspecified: Secondary | ICD-10-CM

## 2017-10-19 DIAGNOSIS — J984 Other disorders of lung: Secondary | ICD-10-CM | POA: Diagnosis not present

## 2017-10-19 DIAGNOSIS — E1065 Type 1 diabetes mellitus with hyperglycemia: Secondary | ICD-10-CM | POA: Diagnosis not present

## 2017-10-19 DIAGNOSIS — J441 Chronic obstructive pulmonary disease with (acute) exacerbation: Secondary | ICD-10-CM | POA: Diagnosis not present

## 2017-10-19 DIAGNOSIS — E559 Vitamin D deficiency, unspecified: Secondary | ICD-10-CM | POA: Diagnosis not present

## 2017-10-19 DIAGNOSIS — J449 Chronic obstructive pulmonary disease, unspecified: Secondary | ICD-10-CM | POA: Diagnosis not present

## 2017-10-19 DIAGNOSIS — I2510 Atherosclerotic heart disease of native coronary artery without angina pectoris: Secondary | ICD-10-CM | POA: Diagnosis not present

## 2017-10-19 DIAGNOSIS — J439 Emphysema, unspecified: Secondary | ICD-10-CM | POA: Insufficient documentation

## 2017-10-19 DIAGNOSIS — J9611 Chronic respiratory failure with hypoxia: Secondary | ICD-10-CM

## 2017-10-19 DIAGNOSIS — E039 Hypothyroidism, unspecified: Secondary | ICD-10-CM

## 2017-10-19 DIAGNOSIS — E785 Hyperlipidemia, unspecified: Secondary | ICD-10-CM | POA: Diagnosis not present

## 2017-10-19 DIAGNOSIS — I700 Atherosclerosis of aorta: Secondary | ICD-10-CM | POA: Insufficient documentation

## 2017-10-19 LAB — PULMONARY FUNCTION TEST
DL/VA % PRED: 34 %
DL/VA: 1.66 ml/min/mmHg/L
DLCO UNC % PRED: 25 %
DLCO cor % pred: 24 %
DLCO cor: 6.01 ml/min/mmHg
DLCO unc: 6.15 ml/min/mmHg
FEF 25-75 PRE: 1.06 L/s
FEF 25-75 Post: 1.48 L/sec
FEF2575-%CHANGE-POST: 40 %
FEF2575-%PRED-POST: 85 %
FEF2575-%Pred-Pre: 61 %
FEV1-%CHANGE-POST: 7 %
FEV1-%PRED-PRE: 78 %
FEV1-%Pred-Post: 84 %
FEV1-PRE: 1.7 L
FEV1-Post: 1.83 L
FEV1FVC-%CHANGE-POST: 5 %
FEV1FVC-%Pred-Pre: 94 %
FEV6-%CHANGE-POST: 2 %
FEV6-%PRED-PRE: 86 %
FEV6-%Pred-Post: 88 %
FEV6-Post: 2.42 L
FEV6-Pre: 2.37 L
FEV6FVC-%Change-Post: 0 %
FEV6FVC-%Pred-Post: 104 %
FEV6FVC-%Pred-Pre: 104 %
FVC-%CHANGE-POST: 1 %
FVC-%PRED-POST: 84 %
FVC-%Pred-Pre: 83 %
FVC-POST: 2.43 L
FVC-Pre: 2.4 L
POST FEV1/FVC RATIO: 75 %
POST FEV6/FVC RATIO: 100 %
PRE FEV6/FVC RATIO: 99 %
Pre FEV1/FVC ratio: 71 %
RV % PRED: 60 %
RV: 1.36 L
TLC % pred: 75 %
TLC: 3.83 L

## 2017-10-19 MED ORDER — KETOCONAZOLE 2 % EX SHAM: 1.0000 "application " | MEDICATED_SHAMPOO | CUTANEOUS | Status: DC

## 2017-10-19 MED ORDER — LEVOTHYROXINE SODIUM 112 MCG PO TABS: ORAL_TABLET | ORAL | 1 refills | Status: DC

## 2017-10-19 MED ORDER — BUDESONIDE-FORMOTEROL FUMARATE 160-4.5 MCG/ACT IN AERO: 2.0000 | INHALATION_SPRAY | Freq: Two times a day (BID) | RESPIRATORY_TRACT | 0 refills | Status: DC

## 2017-10-19 MED ORDER — ATENOLOL 25 MG PO TABS: 25.0000 mg | ORAL_TABLET | Freq: Every day | ORAL | 3 refills | Status: DC

## 2017-10-19 MED ORDER — ATENOLOL 50 MG PO TABS: ORAL_TABLET | ORAL | 1 refills | Status: DC

## 2017-10-19 NOTE — Progress Notes (Signed)
Subjective:  I acted as a Education administrator for Dr. Charlett Blake. Kimberly Irwin, [place]  Patient ID: Kimberly Irwin, female    DOB: [birth year], 72 y.o.   MRN: [id number]  No chief complaint on file.   HPI  Patient is in today for a follow up. Overall she is feeling well although she does continue sto struggle with some SOB most notably with exertion. No associated symptoms. She is now being followed by Dr Wynonia Lawman of cardiology and Dr Lake Bells of pulmonology. No recent febrile illness or hospitalizations. Denies CP/palp/HA/congestion/fevers/GI or GU c/o. Taking meds as prescribed  Patient Care Team: Mosie Lukes, MD as PCP - General (Family Medicine) Earnie Larsson, MD as Consulting Physician (Neurosurgery) Paralee Cancel, MD as Consulting Physician (Orthopedic Surgery) Parrett, Fonnie Mu, NP as Nurse Practitioner (Pulmonary Disease)   Past Medical History:  Diagnosis Date  . Acute hip pain 10/26/2012  . Bronchitis 09/04/2016  . Bursitis of left hip 10/26/2012  . Cardiomegaly 07/13/2016  . Chronic low back pain   . Coronary artery calcification 07/03/2016  . DDD (degenerative disc disease)    L3-4 with facet arthropathy and stenosis  . Degenerative spondylolisthesis    L4-5 grade 1 with stenosis  . GERD (gastroesophageal reflux disease)   . Heart murmur    as an infant  . History of hiatal hernia   . Hyperlipidemia   . Hypertension   . Hypothyroidism   . Hypothyroidism 10/28/2015  . Hypoxia, sleep related 10/21/2016  . Pain in joint, lower leg 10/26/2012   left   . Pneumonia    as a child several times.   . Sarcoidosis   . SOB (shortness of breath) 04/03/2016  . Tremors of nervous system     Past Surgical History:  Procedure Laterality Date  . ANTERIOR LAT LUMBAR FUSION Left 08/29/2014   Procedure: EXTREME LEFT LATERAL INTERBODY FUSION LUMBAR TWO-THREE LATERAL PLATE;  Surgeon: Charlie Pitter, MD;  Location: [facility];  Service: Neurosurgery;  Laterality: Left;  . BACK SURGERY  01/15/09   L3-4 and L4-5  decompressive laminectomy with bilateral L3, L4, and L5 decompressive foraminotomies, more than it would be required for simple interbody fusion alone.  Marland Kitchen BACK SURGERY  01/15/09   L3-4 and L4-5 posterior lumbar interbody fusion utilizing tanget interbody allograft wedge, Telamon interbody PEEk cage, and local autografting.  Marland Kitchen BACK SURGERY  01/15/09   L3, L4, and L5 posterolateral arthrodesis using segmental pedicle screw fixation and local autografting.  Marland Kitchen CARDIAC CATHETERIZATION    . CARPAL TUNNEL RELEASE Bilateral   . COLONOSCOPY    . ESOPHAGOGASTRODUODENOSCOPY    . EYE SURGERY     lens implant  . JOINT REPLACEMENT  09/13/09   Right Hip, Dr. Alvan Dame  . KYPHOPLASTY N/A 09/14/2014   Procedure: Lumbar Two Kyphoplasty/Vertebroplasty;  Surgeon: Charlie Pitter, MD;  Location: [facility];  Service: Neurosurgery;  Laterality: N/A;  Lumbar Two Kyphoplasty/Vertebroplasty  . THYROIDECTOMY    . TOTAL HIP ARTHROPLASTY Left 06/28/2013   Procedure: LEFT TOTAL  HIP ARTHROPLASTY ANTERIOR APPROACH;  Surgeon: Mauri Pole, MD;  Location: [facility];  Service: Orthopedics;  Laterality: Left;    Family History  Problem Relation Age of Onset  . Cancer Mother 38       Breast  . Heart disease Mother   . Hypertension Mother   . Hyperlipidemia Mother   . Heart attack Mother   . Asthma Maternal Grandmother     Social History   Socioeconomic History  .  Marital status: Widowed    Spouse name: Not on file  . Number of children: 2  . Years of education: Not on file  . Highest education level: Not on file  Social Needs  . Financial resource strain: Not on file  . Food insecurity - worry: Not on file  . Food insecurity - inability: Not on file  . Transportation needs - medical: Not on file  . Transportation needs - non-medical: Not on file  Occupational History  . Not on file  Tobacco Use  . Smoking status: Former Smoker    Packs/day: 0.50    Years: 32.00    Pack years: 16.00    Types: Cigarettes     Last attempt to quit: 07/22/1994    Years since quitting: 23.2  . Smokeless tobacco: Never Used  Substance and Sexual Activity  . Alcohol use: No    Alcohol/week: 0.0 oz  . Drug use: No  . Sexual activity: No  Other Topics Concern  . Not on file  Social History Narrative   Married 66 years and has 2 children (2 daughters)   Alcohol Use - no   Former Smoker - she quit 10 years ago, started when she was 30 and has had varying level of use of tobacco products from 1/2 pack to 3 packs per day.    Outpatient Medications Prior to Visit  Medication Sig Dispense Refill  . albuterol (PROVENTIL HFA;VENTOLIN HFA) 108 (90 Base) MCG/ACT inhaler Inhale 2 puffs into the lungs every 6 (six) hours as needed for wheezing or shortness of breath. 1 Inhaler 6  . albuterol (PROVENTIL) (2.5 MG/3ML) 0.083% nebulizer solution Take 3 mLs (2.5 mg total) by nebulization every 6 (six) hours as needed for wheezing or shortness of breath. 30 mL 4  . aspirin EC 81 MG tablet Take 81 mg by mouth daily.    Marland Kitchen BESIVANCE 0.6 % SUSP Reported on 07/23/2015  12  . Calcium Carbonate-Vit D-Min (CALCIUM 1200 PO) Take 1,200 mg by mouth daily.     . Cholecalciferol (VITAMIN D) 2000 UNITS tablet Take 2,000 Units by mouth daily.    . Cinnamon 500 MG capsule Take 2,000 mg by mouth daily.      . citalopram (CELEXA) 10 MG tablet TAKE 1 TABLET BY MOUTH EVERY DAY 90 tablet 1  . Coenzyme Q10 200 MG capsule Take 200 mg by mouth daily.    . fexofenadine-pseudoephedrine (ALLEGRA-D 24) 180-240 MG 24 hr tablet Take 1 tablet by mouth daily.    Marland Kitchen gabapentin (NEURONTIN) 300 MG capsule Take 1 capsule (300 mg total) by mouth at bedtime. 90 capsule 2  . Glucosamine HCl (GLUCOSAMINE PO) Take by mouth daily.    . hydrochlorothiazide (HYDRODIURIL) 25 MG tablet TAKE 1 TABLET (25 MG TOTAL) BY MOUTH DAILY. 90 tablet 1  . mometasone (NASONEX) 50 MCG/ACT nasal spray Place 2 sprays daily as needed into the nose. 17 g 12  . mupirocin ointment (BACTROBAN) 2 %  Apply twice daily to bridge of the nose. 22 g 0  . Omega-3 Fatty Acids (FISH OIL) 1200 MG CAPS Take 2,400 mg by mouth daily.     Marland Kitchen omeprazole (PRILOSEC) 40 MG capsule TAKE 1 CAPSULE (40 MG TOTAL) BY MOUTH DAILY. 90 capsule 1  . potassium chloride (KLOR-CON 10) 10 MEQ tablet Take 1 tablet (10 mEq total) by mouth daily. 30 tablet 3  . ranitidine (ZANTAC) 150 MG tablet Take 1-2 tablets (150-300 mg total) by mouth at bedtime as needed for  heartburn. 60 tablet 2  . rosuvastatin (CRESTOR) 20 MG tablet TAKE 1 TABLET (20 MG TOTAL) BY MOUTH DAILY. 90 tablet 2  . traMADol (ULTRAM) 50 MG tablet Take 2 tablets (100 mg total) by mouth every 6 (six) hours as needed for moderate pain or severe pain. 120 tablet 0  . vitamin B-12 (CYANOCOBALAMIN) 1000 MCG tablet Take 1,000 mcg by mouth daily.    Marland Kitchen atenolol (TENORMIN) 25 MG tablet TAKE 1 TABLET (25 MG TOTAL) BY MOUTH AT BEDTIME. TAKE THE 50 MG ATENOLOL IN AM AND THE 25 MG IN PM 90 tablet 3  . atenolol (TENORMIN) 50 MG tablet TAKE 1 TABLET (50 MG TOTAL) BY MOUTH DAILY. 90 tablet 1  . budesonide-formoterol (SYMBICORT) 160-4.5 MCG/ACT inhaler Inhale 2 puffs into the lungs 2 (two) times daily. 1 Inhaler 5  . ketoconazole (NIZORAL) 2 % shampoo Apply 1 application topically 2 (two) times a week. 120 mL 03  . levothyroxine (SYNTHROID, LEVOTHROID) 112 MCG tablet TAKE 1 TABLET (112 MCG TOTAL) BY MOUTH DAILY BEFORE BREAKFAST. 90 tablet 1   No facility-administered medications prior to visit.     Allergies  Allergen Reactions  . Lipitor [Atorvastatin] Swelling  . Penicillins Swelling  . Metronidazole Swelling  . Pregabalin Other (See Comments)    REACTION: Somnolence and dizziness  . Simvastatin Other (See Comments)    Leg pain    Review of Systems  Constitutional: Negative for fever and malaise/fatigue.  HENT: Negative for congestion.   Eyes: Negative for blurred vision.  Respiratory: Positive for shortness of breath.   Cardiovascular: Negative for chest pain,  palpitations and leg swelling.  Gastrointestinal: Negative for abdominal pain, blood in stool and nausea.  Genitourinary: Negative for dysuria and frequency.  Musculoskeletal: Negative for falls.  Skin: Negative for rash.  Neurological: Negative for dizziness, loss of consciousness and headaches.  Endo/Heme/Allergies: Negative for environmental allergies.  Psychiatric/Behavioral: Negative for depression. The patient is not nervous/anxious.        Objective:    Physical Exam  Constitutional: She is oriented to person, place, and time. She appears well-developed and well-nourished. No distress.  HENT:  Head: Normocephalic and atraumatic.  Nose: Nose normal.  Eyes: Right eye exhibits no discharge. Left eye exhibits no discharge.  Neck: Normal range of motion. Neck supple.  Cardiovascular: Normal rate and regular rhythm.  No murmur heard. Pulmonary/Chest: Effort normal and breath sounds normal.  Abdominal: Soft. Bowel sounds are normal. There is no tenderness.  Musculoskeletal: She exhibits no edema.  Neurological: She is alert and oriented to person, place, and time.  Skin: Skin is warm and dry.  Psychiatric: She has a normal mood and affect.  Nursing note and vitals reviewed.   BP (!) 102/58 (BP Location: Left Arm, Patient Position: Sitting, Cuff Size: Normal)   Pulse (!) 56   Temp 97.8 F (36.6 C) (Oral)   Resp 18   Wt 169 lb 9.6 oz (76.9 kg)   SpO2 93%   BMI 29.11 kg/m  Wt Readings from Last 3 Encounters:  10/19/17 169 lb 9.6 oz (76.9 kg)  10/12/17 167 lb (75.8 kg)  09/24/17 168 lb (76.2 kg)   BP Readings from Last 3 Encounters:  10/19/17 (!) 102/58  10/12/17 103/70  09/24/17 134/74     Immunization History  Administered Date(s) Administered  . Influenza Split 05/08/2011, 05/26/2012  . Influenza Whole 06/03/2006, 05/31/2007, 04/26/2008, 05/23/2009, 05/20/2010  . Influenza, High Dose Seasonal PF 05/23/2013, 04/03/2016, 06/03/2017  . Influenza,inj,Quad PF,6+ Mos  04/27/2014,  06/15/2015  . Pneumococcal Conjugate-13 04/27/2014  . Pneumococcal Polysaccharide-23 06/18/2011, 07/03/2016  . Tdap 06/18/2011    Health Maintenance  Topic Date Due  . FOOT EXAM  04/09/1954  . OPHTHALMOLOGY EXAM  04/09/1954  . URINE MICROALBUMIN  04/28/2015  . HEMOGLOBIN A1C  12/17/2017  . Fecal DNA (Cologuard)  07/11/2018  . MAMMOGRAM  05/26/2019  . TETANUS/TDAP  06/17/2021  . INFLUENZA VACCINE  Completed  . DEXA SCAN  Completed  . Hepatitis C Screening  Completed  . PNA vac Low Risk Adult  Completed    Lab Results  Component Value Date   WBC 11.4 (H) 10/19/2017   HGB 15.2 (H) 10/19/2017   HCT 45.5 10/19/2017   PLT 203.0 10/19/2017   GLUCOSE 144 (H) 10/19/2017   CHOL 135 10/19/2017   TRIG 200.0 (H) 10/19/2017   HDL 42.30 10/19/2017   LDLDIRECT 84.0 03/03/2017   LDLCALC 53 10/19/2017   ALT 16 10/19/2017   AST 20 10/19/2017   NA 139 10/19/2017   K 3.5 10/19/2017   CL 102 10/19/2017   CREATININE 1.05 10/19/2017   BUN 15 10/19/2017   CO2 26 10/19/2017   TSH 0.11 (L) 10/19/2017   INR 1.06 06/23/2013   HGBA1C 5.5 06/19/2017   MICROALBUR 0.2 04/27/2014    Lab Results  Component Value Date   TSH 0.11 (L) 10/19/2017   Lab Results  Component Value Date   WBC 11.4 (H) 10/19/2017   HGB 15.2 (H) 10/19/2017   HCT 45.5 10/19/2017   MCV 89.3 10/19/2017   PLT 203.0 10/19/2017   Lab Results  Component Value Date   NA 139 10/19/2017   K 3.5 10/19/2017   CO2 26 10/19/2017   GLUCOSE 144 (H) 10/19/2017   BUN 15 10/19/2017   CREATININE 1.05 10/19/2017   BILITOT 0.8 10/19/2017   ALKPHOS 45 10/19/2017   AST 20 10/19/2017   ALT 16 10/19/2017   PROT 7.3 10/19/2017   ALBUMIN 4.2 10/19/2017   CALCIUM 10.1 10/19/2017   ANIONGAP 10 09/24/2017   GFR 54.52 (L) 10/19/2017   Lab Results  Component Value Date   CHOL 135 10/19/2017   Lab Results  Component Value Date   HDL 42.30 10/19/2017   Lab Results  Component Value Date   LDLCALC 53 10/19/2017   Lab  Results  Component Value Date   TRIG 200.0 (H) 10/19/2017   Lab Results  Component Value Date   CHOLHDL 3 10/19/2017   Lab Results  Component Value Date   HGBA1C 5.5 06/19/2017         Assessment & Plan:   Problem List Items Addressed This Visit    Hyperlipidemia (Chronic)   Relevant Medications   atenolol (TENORMIN) 50 MG tablet   atenolol (TENORMIN) 25 MG tablet   Other Relevant Orders   Lipid panel (Completed)   Hyperglycemia    hgba1c acceptable, minimize simple carbs. Increase exercise as tolerated.      Hypothyroidism - Primary    On Levothyroxine, continue to monitor      Relevant Medications   levothyroxine (SYNTHROID, LEVOTHROID) 112 MCG tablet   atenolol (TENORMIN) 50 MG tablet   atenolol (TENORMIN) 25 MG tablet   Other Relevant Orders   TSH (Completed)   COPD (chronic obstructive pulmonary disease) (HCC)   Relevant Medications   budesonide-formoterol (SYMBICORT) 160-4.5 MCG/ACT inhaler   Other Relevant Orders   CBC with Differential/Platelet (Completed)    Other Visit Diagnoses    Type 1 diabetes mellitus with hyperglycemia ([facility])  Relevant Orders   Comprehensive metabolic panel (Completed)   Vitamin D deficiency       Relevant Orders   VITAMIN D 25 Hydroxy (Vit-D Deficiency, Fractures) (Completed)      I am having Sunday Spillers H. Freehling maintain her Cinnamon, Fish Oil, Calcium Carbonate-Vit D-Min (CALCIUM 1200 PO), Vitamin D, Coenzyme Q10, aspirin EC, traMADol, BESIVANCE, mupirocin ointment, Glucosamine HCl (GLUCOSAMINE PO), fexofenadine-pseudoephedrine, vitamin B-12, ranitidine, albuterol, gabapentin, albuterol, omeprazole, mometasone, rosuvastatin, potassium chloride, hydrochlorothiazide, citalopram, levothyroxine, ketoconazole, atenolol, atenolol, and budesonide-formoterol.  Meds ordered this encounter  Medications  . levothyroxine (SYNTHROID, LEVOTHROID) 112 MCG tablet    Sig: TAKE 1 TABLET (112 MCG TOTAL) BY MOUTH DAILY BEFORE BREAKFAST.     Dispense:  90 tablet    Refill:  1  . ketoconazole (NIZORAL) 2 % shampoo    Sig: Apply 1 application topically 2 (two) times a week.    Dispense:  120 mL    Refill:  03  . atenolol (TENORMIN) 50 MG tablet    Sig: TAKE 1 TABLET (50 MG TOTAL) BY MOUTH DAILY.    Dispense:  90 tablet    Refill:  1  . atenolol (TENORMIN) 25 MG tablet    Sig: Take 1 tablet (25 mg total) by mouth at bedtime. Take the 50 mg Atenolol in am and the 25 mg in pm    Dispense:  90 tablet    Refill:  3  . DISCONTD: budesonide-formoterol (SYMBICORT) 160-4.5 MCG/ACT inhaler    Sig: Inhale 2 puffs into the lungs 2 (two) times daily.    Dispense:  1 Inhaler    Refill:  0  . budesonide-formoterol (SYMBICORT) 160-4.5 MCG/ACT inhaler    Sig: Inhale 2 puffs into the lungs 2 (two) times daily.    Dispense:  1 Inhaler    Refill:  0    CMA served as scribe during this visit. History, Physical and Plan performed by medical provider. Documentation and orders reviewed and attested to.  Penni Homans, MD

## 2017-10-19 NOTE — Progress Notes (Signed)
PFT done today. 

## 2017-10-19 NOTE — Patient Instructions (Signed)

## 2017-10-20 ENCOUNTER — Other Ambulatory Visit (HOSPITAL_BASED_OUTPATIENT_CLINIC_OR_DEPARTMENT_OTHER): Payer: Medicare Other

## 2017-10-20 LAB — LIPID PANEL
CHOL/HDL RATIO: 3
Cholesterol: 135 mg/dL (ref 0–200)
HDL: 42.3 mg/dL (ref >39.00)
LDL CALC: 53 mg/dL (ref 0–99)
NonHDL: 92.98
TRIGLYCERIDES: 200 mg/dL — AB (ref 0.0–149.0)
VLDL: 40 mg/dL (ref 0.0–40.0)

## 2017-10-20 LAB — CBC WITH DIFFERENTIAL/PLATELET
Basophils Absolute: 0.1 10*3/uL (ref 0.0–0.1)
Basophils Relative: 1.3 % (ref 0.0–3.0)
EOS PCT: 1.2 % (ref 0.0–5.0)
Eosinophils Absolute: 0.1 10*3/uL (ref 0.0–0.7)
HCT: 45.5 % (ref 36.0–46.0)
HEMOGLOBIN: 15.2 g/dL — AB (ref 12.0–15.0)
Lymphocytes Relative: 21.6 % (ref 12.0–46.0)
Lymphs Abs: 2.5 10*3/uL (ref 0.7–4.0)
MCHC: 33.5 g/dL (ref 30.0–36.0)
MCV: 89.3 fl (ref 78.0–100.0)
MONOS PCT: 9.1 % (ref 3.0–12.0)
Monocytes Absolute: 1 10*3/uL (ref 0.1–1.0)
Neutro Abs: 7.6 10*3/uL (ref 1.4–7.7)
Neutrophils Relative %: 66.8 % (ref 43.0–77.0)
Platelets: 203 10*3/uL (ref 150.0–400.0)
RBC: 5.09 Mil/uL (ref 3.87–5.11)
RDW: 14.3 % (ref 11.5–15.5)
WBC: 11.4 10*3/uL — AB (ref 4.0–10.5)

## 2017-10-20 LAB — COMPREHENSIVE METABOLIC PANEL
ALBUMIN: 4.2 g/dL (ref 3.5–5.2)
ALK PHOS: 45 U/L (ref 39–117)
ALT: 16 U/L (ref 0–35)
AST: 20 U/L (ref 0–37)
BUN: 15 mg/dL (ref 6–23)
CHLORIDE: 102 meq/L (ref 96–112)
CO2: 26 mEq/L (ref 19–32)
Calcium: 10.1 mg/dL (ref 8.4–10.5)
Creatinine, Ser: 1.05 mg/dL (ref 0.40–1.20)
GFR: 54.52 mL/min — ABNORMAL LOW (ref >60.00)
GLUCOSE: 144 mg/dL — AB (ref 70–99)
POTASSIUM: 3.5 meq/L (ref 3.5–5.1)
SODIUM: 139 meq/L (ref 135–145)
TOTAL PROTEIN: 7.3 g/dL (ref 6.0–8.3)
Total Bilirubin: 0.8 mg/dL (ref 0.2–1.2)

## 2017-10-20 LAB — TSH: TSH: 0.11 u[IU]/mL — ABNORMAL LOW (ref 0.35–4.50)

## 2017-10-20 LAB — VITAMIN D 25 HYDROXY (VIT D DEFICIENCY, FRACTURES): VITD: 85.07 ng/mL (ref 30.00–100.00)

## 2017-10-21 NOTE — Assessment & Plan Note (Signed)
On Levothyroxine, continue to monitor 

## 2017-10-21 NOTE — Assessment & Plan Note (Signed)
hgba1c acceptable, minimize simple carbs. Increase exercise as tolerated.  

## 2017-11-05 ENCOUNTER — Ambulatory Visit (INDEPENDENT_AMBULATORY_CARE_PROVIDER_SITE_OTHER): Payer: Medicare Other | Admitting: Pulmonary Disease

## 2017-11-05 VITALS — BP 138/81 | HR 67 | Ht 64.0 in | Wt 167.0 lb

## 2017-11-05 DIAGNOSIS — D869 Sarcoidosis, unspecified: Secondary | ICD-10-CM

## 2017-11-05 DIAGNOSIS — I270 Primary pulmonary hypertension: Secondary | ICD-10-CM | POA: Diagnosis not present

## 2017-11-05 DIAGNOSIS — J439 Emphysema, unspecified: Secondary | ICD-10-CM | POA: Diagnosis not present

## 2017-11-05 DIAGNOSIS — J9611 Chronic respiratory failure with hypoxia: Secondary | ICD-10-CM

## 2017-11-05 NOTE — Patient Instructions (Signed)
Pulmonary hypertension: Your echocardiogram showed in 2017 that the pressure in your lungs was three time higher than normal.   The testing I performed this year suggests that it is worsening I would like for you to have a right heart catheterization to assess this further If you have not heard from the cardiology clinic by next week then let me know  COPD: Continue taking Symbicort  Sarcoidosis: Conitnue taking Symbicort  Chronic respiratory failure with hypoxemia: Use 2L O2 at rest and 6L pulse when walking  Follow up in 3-4 weeks

## 2017-11-05 NOTE — H&P (View-Only) (Signed)
Subjective:   PATIENT ID: Kimberly Irwin GENDER: female DOB: 22-Dec-1945, MRN: [id number]   Synopsis: Former patient of Dr. Joya Gaskins and Dr. Corrie Dandy who has sarcoidosis and centrilobular emphysema.  HPI  Chief Complaint  Patient presents with  . Follow-up    review ct and pft.  breathing is unchanged.    Kimberly Irwin is here today to follow up on her PFT and CT. Her breathing is unchanged No pneumonia since the last visit She doesn't like using the oxygen.    Past Medical History:  Diagnosis Date  . Acute hip pain 10/26/2012  . Bronchitis 09/04/2016  . Bursitis of left hip 10/26/2012  . Cardiomegaly 07/13/2016  . Chronic low back pain   . Coronary artery calcification 07/03/2016  . DDD (degenerative disc disease)    L3-4 with facet arthropathy and stenosis  . Degenerative spondylolisthesis    L4-5 grade 1 with stenosis  . GERD (gastroesophageal reflux disease)   . Heart murmur    as an infant  . History of hiatal hernia   . Hyperlipidemia   . Hypertension   . Hypothyroidism   . Hypothyroidism 10/28/2015  . Hypoxia, sleep related 10/21/2016  . Pain in joint, lower leg 10/26/2012   left   . Pneumonia    as a child several times.   . Sarcoidosis   . SOB (shortness of breath) 04/03/2016  . Tremors of nervous system         Review of Systems  Constitutional: Negative for chills, diaphoresis and malaise/fatigue.  HENT: Negative for congestion and ear discharge.   Respiratory: Positive for shortness of breath. Negative for cough, sputum production, wheezing and stridor.   Cardiovascular: Negative for chest pain, palpitations, claudication and leg swelling.      Objective:  Physical Exam   Vitals:   11/05/17 1512  BP: 138/81  Pulse: 67  SpO2: 91%  Weight: 167 lb (75.8 kg)  Height: 5' 4" (1.626 m)   3Lpm Pulse at rest    Gen: well appearing HENT: OP clear, TM's clear, neck supple PULM: CTA B, normal percussion CV: RRR, no mgr, trace edema GI: BS+, soft,  nontender Derm: no cyanosis or rash Psyche: normal mood and affect   CBC    Component Value Date/Time   WBC 11.4 (H) 10/19/2017 1459   RBC 5.09 10/19/2017 1459   HGB 15.2 (H) 10/19/2017 1459   HCT 45.5 10/19/2017 1459   PLT 203.0 10/19/2017 1459   MCV 89.3 10/19/2017 1459   MCH 30.5 09/24/2017 1126   MCHC 33.5 10/19/2017 1459   RDW 14.3 10/19/2017 1459   LYMPHSABS 2.5 10/19/2017 1459   MONOABS 1.0 10/19/2017 1459   EOSABS 0.1 10/19/2017 1459   BASOSABS 0.1 10/19/2017 1459     Chest imaging: CT chest October 2017 showed no PE . Mild emphysematous changes.  Images independently reviewed by me showing some air trapping and mosaicism.  Some cysts noted in the lower lobes. March 2019 high-resolution CT scan of the chest images independently reviewed, nonspecific interstitial changes in the right greater than left base which are very mild in distribution and overall severity, upper lobe predominant mild to moderate centrilobular emphysema noted, bilateral pulmonary nodules, largest 5 mm, aortic atherosclerosis noted  PFT: PFT (05/2016) Ratio 70% FEV1 1.59 72%, DLCO 35% (flow volume loop consistent with airflow obstruction) PFT (10/2017) ratio 71%, FEV1 1.83 L 84%, FVC 2.43 L 84%, total lung capacity 3.83 L 75%, DLCO 6.15 25%  Labs:  Path:  Echo: 2-D echo on 04/09/2016 showed an EF of 60-65%, grade 1 diastolic dysfunction. Pulmonary artery pressure 44 mmHg  Heart Catheterization:  Exhaled NO: FeNO is 20ppm 05/2016   Other: Stress Myoview 2011 neg for ischemia .     Assessment & Plan:   No diagnosis found.  Discussion: Her lung function testing and her CT scan did not show evidence of worsening COPD or sarcoidosis.  However her diffusion capacity has dropped significantly in the presence of a stable hemoglobin and rising oxygen need.  This is very worrisome for worsening pulmonary hypertension.    She needs a right heart catheterization.  I'm hopeful that she could  benefit from more diuretics and likely pulmonary vasodilators.    Plan: Pulmonary hypertension: Your echocardiogram showed in 2017 that the pressure in your lungs was three time higher than normal.   The testing I performed this year suggests that it is worsening I would like for you to have a right heart catheterization to assess this further If you have not heard from the cardiology clinic by next week then let me know  COPD: Continue taking Symbicort  Sarcoidosis: Conitnue taking Symbicort  Chronic respiratory failure with hypoxemia: Use 2L O2 at rest and 6L pulse when walking  Follow up in 3-4 weeks  20 min spent with patient 27 min visit   Current Outpatient Medications:  .  albuterol (PROVENTIL HFA;VENTOLIN HFA) 108 (90 Base) MCG/ACT inhaler, Inhale 2 puffs into the lungs every 6 (six) hours as needed for wheezing or shortness of breath., Disp: 1 Inhaler, Rfl: 6 .  albuterol (PROVENTIL) (2.5 MG/3ML) 0.083% nebulizer solution, Take 3 mLs (2.5 mg total) by nebulization every 6 (six) hours as needed for wheezing or shortness of breath., Disp: 30 mL, Rfl: 4 .  aspirin EC 81 MG tablet, Take 81 mg by mouth daily., Disp: , Rfl:  .  atenolol (TENORMIN) 25 MG tablet, Take 1 tablet (25 mg total) by mouth at bedtime. Take the 50 mg Atenolol in am and the 25 mg in pm, Disp: 90 tablet, Rfl: 3 .  atenolol (TENORMIN) 50 MG tablet, TAKE 1 TABLET (50 MG TOTAL) BY MOUTH DAILY., Disp: 90 tablet, Rfl: 1 .  BESIVANCE 0.6 % SUSP, Reported on 07/23/2015, Disp: , Rfl: 12 .  budesonide-formoterol (SYMBICORT) 160-4.5 MCG/ACT inhaler, Inhale 2 puffs into the lungs 2 (two) times daily., Disp: 1 Inhaler, Rfl: 0 .  Calcium Carbonate-Vit D-Min (CALCIUM 1200 PO), Take 1,200 mg by mouth daily. , Disp: , Rfl:  .  Cholecalciferol (VITAMIN D) 2000 UNITS tablet, Take 2,000 Units by mouth daily., Disp: , Rfl:  .  Cinnamon 500 MG capsule, Take 2,000 mg by mouth daily.  , Disp: , Rfl:  .  citalopram (CELEXA) 10 MG  tablet, TAKE 1 TABLET BY MOUTH EVERY DAY, Disp: 90 tablet, Rfl: 1 .  Coenzyme Q10 200 MG capsule, Take 200 mg by mouth daily., Disp: , Rfl:  .  fexofenadine-pseudoephedrine (ALLEGRA-D 24) 180-240 MG 24 hr tablet, Take 1 tablet by mouth daily., Disp: , Rfl:  .  gabapentin (NEURONTIN) 300 MG capsule, Take 1 capsule (300 mg total) by mouth at bedtime., Disp: 90 capsule, Rfl: 2 .  Glucosamine HCl (GLUCOSAMINE PO), Take by mouth daily., Disp: , Rfl:  .  hydrochlorothiazide (HYDRODIURIL) 25 MG tablet, TAKE 1 TABLET (25 MG TOTAL) BY MOUTH DAILY., Disp: 90 tablet, Rfl: 1 .  ketoconazole (NIZORAL) 2 % shampoo, Apply 1 application topically 2 (two) times a week., Disp: 120 mL,   Rfl: 03 .  levothyroxine (SYNTHROID, LEVOTHROID) 112 MCG tablet, TAKE 1 TABLET (112 MCG TOTAL) BY MOUTH DAILY BEFORE BREAKFAST., Disp: 90 tablet, Rfl: 1 .  mometasone (NASONEX) 50 MCG/ACT nasal spray, Place 2 sprays daily as needed into the nose., Disp: 17 g, Rfl: 12 .  mupirocin ointment (BACTROBAN) 2 %, Apply twice daily to bridge of the nose., Disp: 22 g, Rfl: 0 .  Omega-3 Fatty Acids (FISH OIL) 1200 MG CAPS, Take 2,400 mg by mouth daily. , Disp: , Rfl:  .  omeprazole (PRILOSEC) 40 MG capsule, TAKE 1 CAPSULE (40 MG TOTAL) BY MOUTH DAILY., Disp: 90 capsule, Rfl: 1 .  potassium chloride (KLOR-CON 10) 10 MEQ tablet, Take 1 tablet (10 mEq total) by mouth daily., Disp: 30 tablet, Rfl: 3 .  ranitidine (ZANTAC) 150 MG tablet, Take 1-2 tablets (150-300 mg total) by mouth at bedtime as needed for heartburn., Disp: 60 tablet, Rfl: 2 .  rosuvastatin (CRESTOR) 20 MG tablet, TAKE 1 TABLET (20 MG TOTAL) BY MOUTH DAILY., Disp: 90 tablet, Rfl: 2 .  traMADol (ULTRAM) 50 MG tablet, Take 2 tablets (100 mg total) by mouth every 6 (six) hours as needed for moderate pain or severe pain., Disp: 120 tablet, Rfl: 0 .  vitamin B-12 (CYANOCOBALAMIN) 1000 MCG tablet, Take 1,000 mcg by mouth daily., Disp: , Rfl:

## 2017-11-05 NOTE — Progress Notes (Signed)
Subjective:   PATIENT ID: Kimberly Irwin GENDER: female DOB: 22-Dec-1945, MRN: [id number]   Synopsis: Former patient of Dr. Joya Gaskins and Dr. Corrie Dandy who has sarcoidosis and centrilobular emphysema.  HPI  Chief Complaint  Patient presents with  . Follow-up    review ct and pft.  breathing is unchanged.    [name] is here today to follow up on her PFT and CT. Her breathing is unchanged No pneumonia since the last visit She doesn't like using the oxygen.    Past Medical History:  Diagnosis Date  . Acute hip pain 10/26/2012  . Bronchitis 09/04/2016  . Bursitis of left hip 10/26/2012  . Cardiomegaly 07/13/2016  . Chronic low back pain   . Coronary artery calcification 07/03/2016  . DDD (degenerative disc disease)    L3-4 with facet arthropathy and stenosis  . Degenerative spondylolisthesis    L4-5 grade 1 with stenosis  . GERD (gastroesophageal reflux disease)   . Heart murmur    as an infant  . History of hiatal hernia   . Hyperlipidemia   . Hypertension   . Hypothyroidism   . Hypothyroidism 10/28/2015  . Hypoxia, sleep related 10/21/2016  . Pain in joint, lower leg 10/26/2012   left   . Pneumonia    as a child several times.   . Sarcoidosis   . SOB (shortness of breath) 04/03/2016  . Tremors of nervous system         Review of Systems  Constitutional: Negative for chills, diaphoresis and malaise/fatigue.  HENT: Negative for congestion and ear discharge.   Respiratory: Positive for shortness of breath. Negative for cough, sputum production, wheezing and stridor.   Cardiovascular: Negative for chest pain, palpitations, claudication and leg swelling.      Objective:  Physical Exam   Vitals:   11/05/17 1512  BP: 138/81  Pulse: 67  SpO2: 91%  Weight: 167 lb (75.8 kg)  Height: 5' 4" (1.626 m)   3Lpm Pulse at rest    Gen: well appearing HENT: OP clear, TM's clear, neck supple PULM: CTA B, normal percussion CV: RRR, no mgr, trace edema GI: BS+, soft,  nontender Derm: no cyanosis or rash Psyche: normal mood and affect   CBC    Component Value Date/Time   WBC 11.4 (H) 10/19/2017 1459   RBC 5.09 10/19/2017 1459   HGB 15.2 (H) 10/19/2017 1459   HCT 45.5 10/19/2017 1459   PLT 203.0 10/19/2017 1459   MCV 89.3 10/19/2017 1459   MCH 30.5 09/24/2017 1126   MCHC 33.5 10/19/2017 1459   RDW 14.3 10/19/2017 1459   LYMPHSABS 2.5 10/19/2017 1459   MONOABS 1.0 10/19/2017 1459   EOSABS 0.1 10/19/2017 1459   BASOSABS 0.1 10/19/2017 1459     Chest imaging: CT chest October 2017 showed no PE . Mild emphysematous changes.  Images independently reviewed by me showing some air trapping and mosaicism.  Some cysts noted in the lower lobes. March 2019 high-resolution CT scan of the chest images independently reviewed, nonspecific interstitial changes in the right greater than left base which are very mild in distribution and overall severity, upper lobe predominant mild to moderate centrilobular emphysema noted, bilateral pulmonary nodules, largest 5 mm, aortic atherosclerosis noted  PFT: PFT (05/2016) Ratio 70% FEV1 1.59 72%, DLCO 35% (flow volume loop consistent with airflow obstruction) PFT (10/2017) ratio 71%, FEV1 1.83 L 84%, FVC 2.43 L 84%, total lung capacity 3.83 L 75%, DLCO 6.15 25%  Labs:  Path:  Echo: 2-D echo on 04/09/2016 showed an EF of 13-24%, grade 1 diastolic dysfunction. Pulmonary artery pressure 44 mmHg  Heart Catheterization:  Exhaled NO: FeNO is 20ppm 05/2016   Other: Stress Myoview 2011 neg for ischemia .     Assessment & Plan:   No diagnosis found.  Discussion: Her lung function testing and her CT scan did not show evidence of worsening COPD or sarcoidosis.  However her diffusion capacity has dropped significantly in the presence of a stable hemoglobin and rising oxygen need.  This is very worrisome for worsening pulmonary hypertension.    She needs a right heart catheterization.  I'm hopeful that she could  benefit from more diuretics and likely pulmonary vasodilators.    Plan: Pulmonary hypertension: Your echocardiogram showed in 2017 that the pressure in your lungs was three time higher than normal.   The testing I performed this year suggests that it is worsening I would like for you to have a right heart catheterization to assess this further If you have not heard from the cardiology clinic by next week then let me know  COPD: Continue taking Symbicort  Sarcoidosis: Conitnue taking Symbicort  Chronic respiratory failure with hypoxemia: Use 2L O2 at rest and 6L pulse when walking  Follow up in 3-4 weeks  20 min spent with patient 27 min visit   Current Outpatient Medications:  .  albuterol (PROVENTIL HFA;VENTOLIN HFA) 108 (90 Base) MCG/ACT inhaler, Inhale 2 puffs into the lungs every 6 (six) hours as needed for wheezing or shortness of breath., Disp: 1 Inhaler, Rfl: 6 .  albuterol (PROVENTIL) (2.5 MG/3ML) 0.083% nebulizer solution, Take 3 mLs (2.5 mg total) by nebulization every 6 (six) hours as needed for wheezing or shortness of breath., Disp: 30 mL, Rfl: 4 .  aspirin EC 81 MG tablet, Take 81 mg by mouth daily., Disp: , Rfl:  .  atenolol (TENORMIN) 25 MG tablet, Take 1 tablet (25 mg total) by mouth at bedtime. Take the 50 mg Atenolol in am and the 25 mg in pm, Disp: 90 tablet, Rfl: 3 .  atenolol (TENORMIN) 50 MG tablet, TAKE 1 TABLET (50 MG TOTAL) BY MOUTH DAILY., Disp: 90 tablet, Rfl: 1 .  BESIVANCE 0.6 % SUSP, Reported on 07/23/2015, Disp: , Rfl: 12 .  budesonide-formoterol (SYMBICORT) 160-4.5 MCG/ACT inhaler, Inhale 2 puffs into the lungs 2 (two) times daily., Disp: 1 Inhaler, Rfl: 0 .  Calcium Carbonate-Vit D-Min (CALCIUM 1200 PO), Take 1,200 mg by mouth daily. , Disp: , Rfl:  .  Cholecalciferol (VITAMIN D) 2000 UNITS tablet, Take 2,000 Units by mouth daily., Disp: , Rfl:  .  Cinnamon 500 MG capsule, Take 2,000 mg by mouth daily.  , Disp: , Rfl:  .  citalopram (CELEXA) 10 MG  tablet, TAKE 1 TABLET BY MOUTH EVERY DAY, Disp: 90 tablet, Rfl: 1 .  Coenzyme Q10 200 MG capsule, Take 200 mg by mouth daily., Disp: , Rfl:  .  fexofenadine-pseudoephedrine (ALLEGRA-D 24) 180-240 MG 24 hr tablet, Take 1 tablet by mouth daily., Disp: , Rfl:  .  gabapentin (NEURONTIN) 300 MG capsule, Take 1 capsule (300 mg total) by mouth at bedtime., Disp: 90 capsule, Rfl: 2 .  Glucosamine HCl (GLUCOSAMINE PO), Take by mouth daily., Disp: , Rfl:  .  hydrochlorothiazide (HYDRODIURIL) 25 MG tablet, TAKE 1 TABLET (25 MG TOTAL) BY MOUTH DAILY., Disp: 90 tablet, Rfl: 1 .  ketoconazole (NIZORAL) 2 % shampoo, Apply 1 application topically 2 (two) times a week., Disp: 120 mL,  Rfl: 03 .  levothyroxine (SYNTHROID, LEVOTHROID) 112 MCG tablet, TAKE 1 TABLET (112 MCG TOTAL) BY MOUTH DAILY BEFORE BREAKFAST., Disp: 90 tablet, Rfl: 1 .  mometasone (NASONEX) 50 MCG/ACT nasal spray, Place 2 sprays daily as needed into the nose., Disp: 17 g, Rfl: 12 .  mupirocin ointment (BACTROBAN) 2 %, Apply twice daily to bridge of the nose., Disp: 22 g, Rfl: 0 .  Omega-3 Fatty Acids (FISH OIL) 1200 MG CAPS, Take 2,400 mg by mouth daily. , Disp: , Rfl:  .  omeprazole (PRILOSEC) 40 MG capsule, TAKE 1 CAPSULE (40 MG TOTAL) BY MOUTH DAILY., Disp: 90 capsule, Rfl: 1 .  potassium chloride (KLOR-CON 10) 10 MEQ tablet, Take 1 tablet (10 mEq total) by mouth daily., Disp: 30 tablet, Rfl: 3 .  ranitidine (ZANTAC) 150 MG tablet, Take 1-2 tablets (150-300 mg total) by mouth at bedtime as needed for heartburn., Disp: 60 tablet, Rfl: 2 .  rosuvastatin (CRESTOR) 20 MG tablet, TAKE 1 TABLET (20 MG TOTAL) BY MOUTH DAILY., Disp: 90 tablet, Rfl: 2 .  traMADol (ULTRAM) 50 MG tablet, Take 2 tablets (100 mg total) by mouth every 6 (six) hours as needed for moderate pain or severe pain., Disp: 120 tablet, Rfl: 0 .  vitamin B-12 (CYANOCOBALAMIN) 1000 MCG tablet, Take 1,000 mcg by mouth daily., Disp: , Rfl:

## 2017-11-06 ENCOUNTER — Encounter: Payer: Self-pay | Admitting: Pulmonary Disease

## 2017-11-10 ENCOUNTER — Other Ambulatory Visit: Payer: Self-pay | Admitting: Family Medicine

## 2017-11-11 ENCOUNTER — Telehealth: Payer: Self-pay | Admitting: Pulmonary Disease

## 2017-11-11 NOTE — Telephone Encounter (Signed)
Called and spoke to patient. Patient stated that Dr. Pennie Banter had told her if she had not heard anything by today to call back. Patient stated that she is supposed to be getting a right heart cath. I don't see anything ordered in the referral tab.   BQ please advise.

## 2017-11-12 DIAGNOSIS — H348322 Tributary (branch) retinal vein occlusion, left eye, stable: Secondary | ICD-10-CM | POA: Diagnosis not present

## 2017-11-12 DIAGNOSIS — Z961 Presence of intraocular lens: Secondary | ICD-10-CM | POA: Diagnosis not present

## 2017-11-12 NOTE — Telephone Encounter (Signed)
Per pt's last OV with BQ 11/05/17:  Your echocardiogram showed in 2017 that the pressure in your lungs was three time higher than normal.   The testing I performed this year suggests that it is worsening I would like for you to have a right heart catheterization to assess this further If you have not heard from the cardiology clinic by next week then let me know.  Called [facility] and spoke with [facility] regarding pt. Jasmine stated to me since pt is currently not an established pt at the office, since there was no order placed that was the reason they had not contacted pt.  Per [place], she stated the verbal order was good that we did not need to place an order in pt's chart. She also stated to me she was going to print pt's OV from our office and also place where it states in there from BQ to have either Dr. Haroldine Laws or Dr. Aundra Dubin to perform the catheterization and give it to [facility] who will then contact pt to get the appt scheduled for the heart catheterization.  Will route this message to Dr. Lake Bells as an Juluis Rainier.

## 2017-11-12 NOTE — Telephone Encounter (Signed)
This was supposed to be arranged by the cardiology clinic.  Please contact cardiology and ask if Dr. Haroldine Laws or Aundra Dubin can perform

## 2017-11-12 NOTE — Telephone Encounter (Signed)
Lot going on in this message.  Is there anything I need to do?

## 2017-11-13 NOTE — Progress Notes (Signed)
Done

## 2017-11-13 NOTE — Telephone Encounter (Signed)
We are good on our part.  [facility] was going to call pt to get her scheduled for an appt.

## 2017-11-16 NOTE — Telephone Encounter (Signed)
noted

## 2017-11-17 ENCOUNTER — Encounter (INDEPENDENT_AMBULATORY_CARE_PROVIDER_SITE_OTHER): Payer: Medicare Other | Admitting: Ophthalmology

## 2017-11-17 DIAGNOSIS — H34832 Tributary (branch) retinal vein occlusion, left eye, with macular edema: Secondary | ICD-10-CM | POA: Diagnosis not present

## 2017-11-17 DIAGNOSIS — H35033 Hypertensive retinopathy, bilateral: Secondary | ICD-10-CM | POA: Diagnosis not present

## 2017-11-17 DIAGNOSIS — I10 Essential (primary) hypertension: Secondary | ICD-10-CM

## 2017-11-17 DIAGNOSIS — H43813 Vitreous degeneration, bilateral: Secondary | ICD-10-CM

## 2017-11-19 ENCOUNTER — Telehealth (HOSPITAL_COMMUNITY): Payer: Self-pay

## 2017-11-19 ENCOUNTER — Encounter (HOSPITAL_COMMUNITY)
Admission: RE | Disposition: A | Discharge status: Home / Self Care | Payer: Self-pay | Source: Ambulatory Visit | Attending: Cardiology

## 2017-11-19 ENCOUNTER — Ambulatory Visit (HOSPITAL_COMMUNITY)
Admission: RE | Admit: 2017-11-19 | Discharge: 2017-11-19 | Disposition: A | Discharge status: Home / Self Care | Payer: Medicare Other | Source: Ambulatory Visit | Attending: Cardiology | Admitting: Cardiology

## 2017-11-19 DIAGNOSIS — I2510 Atherosclerotic heart disease of native coronary artery without angina pectoris: Secondary | ICD-10-CM | POA: Insufficient documentation

## 2017-11-19 DIAGNOSIS — Z9981 Dependence on supplemental oxygen: Secondary | ICD-10-CM | POA: Diagnosis not present

## 2017-11-19 DIAGNOSIS — E039 Hypothyroidism, unspecified: Secondary | ICD-10-CM | POA: Insufficient documentation

## 2017-11-19 DIAGNOSIS — D869 Sarcoidosis, unspecified: Secondary | ICD-10-CM | POA: Insufficient documentation

## 2017-11-19 DIAGNOSIS — I10 Essential (primary) hypertension: Secondary | ICD-10-CM | POA: Insufficient documentation

## 2017-11-19 DIAGNOSIS — J432 Centrilobular emphysema: Secondary | ICD-10-CM | POA: Diagnosis not present

## 2017-11-19 DIAGNOSIS — I2721 Secondary pulmonary arterial hypertension: Secondary | ICD-10-CM | POA: Insufficient documentation

## 2017-11-19 DIAGNOSIS — J9611 Chronic respiratory failure with hypoxia: Secondary | ICD-10-CM | POA: Insufficient documentation

## 2017-11-19 DIAGNOSIS — Z7982 Long term (current) use of aspirin: Secondary | ICD-10-CM | POA: Diagnosis not present

## 2017-11-19 DIAGNOSIS — Z79890 Hormone replacement therapy (postmenopausal): Secondary | ICD-10-CM | POA: Insufficient documentation

## 2017-11-19 DIAGNOSIS — E785 Hyperlipidemia, unspecified: Secondary | ICD-10-CM | POA: Insufficient documentation

## 2017-11-19 DIAGNOSIS — K219 Gastro-esophageal reflux disease without esophagitis: Secondary | ICD-10-CM | POA: Insufficient documentation

## 2017-11-19 DIAGNOSIS — I272 Pulmonary hypertension, unspecified: Secondary | ICD-10-CM | POA: Diagnosis not present

## 2017-11-19 DIAGNOSIS — Z79899 Other long term (current) drug therapy: Secondary | ICD-10-CM | POA: Insufficient documentation

## 2017-11-19 HISTORY — PX: RIGHT HEART CATH: CATH118263

## 2017-11-19 LAB — CBC
HCT: 45.8 % (ref 36.0–46.0)
HEMOGLOBIN: 15 g/dL (ref 12.0–15.0)
MCH: 30.1 pg (ref 26.0–34.0)
MCHC: 32.8 g/dL (ref 30.0–36.0)
MCV: 91.8 fL (ref 78.0–100.0)
Platelets: 164 10*3/uL (ref 150–400)
RBC: 4.99 MIL/uL (ref 3.87–5.11)
RDW: 13.8 % (ref 11.5–15.5)
WBC: 8.5 10*3/uL (ref 4.0–10.5)

## 2017-11-19 LAB — POCT I-STAT 3, VENOUS BLOOD GAS (G3P V)
Acid-Base Excess: 1 mmol/L (ref 0.0–2.0)
BICARBONATE: 25.6 mmol/L (ref 20.0–28.0)
Bicarbonate: 24.7 mmol/L (ref 20.0–28.0)
O2 SAT: 62 %
O2 Saturation: 59 %
PCO2 VEN: 40.6 mmHg — AB (ref 44.0–60.0)
PCO2 VEN: 40.8 mmHg — AB (ref 44.0–60.0)
PH VEN: 7.406 (ref 7.250–7.430)
PO2 VEN: 30 mmHg — AB (ref 32.0–45.0)
PO2 VEN: 32 mmHg (ref 32.0–45.0)
TCO2: 26 mmol/L (ref 22–32)
TCO2: 27 mmol/L (ref 22–32)
pH, Ven: 7.392 (ref 7.250–7.430)

## 2017-11-19 LAB — PROTIME-INR
INR: 1.11
PROTHROMBIN TIME: 14.3 s (ref 11.4–15.2)

## 2017-11-19 LAB — BASIC METABOLIC PANEL
Anion gap: 10 (ref 5–15)
BUN: 12 mg/dL (ref 6–20)
CHLORIDE: 105 mmol/L (ref 101–111)
CO2: 26 mmol/L (ref 22–32)
Calcium: 9.9 mg/dL (ref 8.9–10.3)
Creatinine, Ser: 1.06 mg/dL — ABNORMAL HIGH (ref 0.44–1.00)
GFR calc Af Amer: 59 mL/min — ABNORMAL LOW (ref >60)
GFR calc non Af Amer: 51 mL/min — ABNORMAL LOW (ref >60)
Glucose, Bld: 113 mg/dL — ABNORMAL HIGH (ref 65–99)
POTASSIUM: 4.8 mmol/L (ref 3.5–5.1)
SODIUM: 141 mmol/L (ref 135–145)

## 2017-11-19 SURGERY — RIGHT HEART CATH
Anesthesia: LOCAL

## 2017-11-19 MED ORDER — ACETAMINOPHEN 325 MG PO TABS: 650.0000 mg | ORAL_TABLET | ORAL | Status: DC | PRN

## 2017-11-19 MED ORDER — ASPIRIN 81 MG PO CHEW
CHEWABLE_TABLET | ORAL | Status: AC
  Administered 2017-11-19: 81 mg via ORAL
  Filled 2017-11-19: qty 1

## 2017-11-19 MED ORDER — LIDOCAINE HCL (PF) 1 % IJ SOLN
INTRAMUSCULAR | Status: DC | PRN
  Administered 2017-11-19: 1 mL via INTRADERMAL

## 2017-11-19 MED ORDER — SODIUM CHLORIDE 0.9% FLUSH: 3.0000 mL | INTRAVENOUS | Status: DC | PRN

## 2017-11-19 MED ORDER — SODIUM CHLORIDE 0.9% FLUSH: 3.0000 mL | Freq: Two times a day (BID) | INTRAVENOUS | Status: DC

## 2017-11-19 MED ORDER — HEPARIN (PORCINE) IN NACL 1000-0.9 UT/500ML-% IV SOLN
INTRAVENOUS | Status: AC
  Filled 2017-11-19: qty 500

## 2017-11-19 MED ORDER — SODIUM CHLORIDE 0.9 % IV SOLN: 250.0000 mL | INTRAVENOUS | Status: DC | PRN

## 2017-11-19 MED ORDER — SODIUM CHLORIDE 0.9 % IV SOLN
INTRAVENOUS | Status: DC
  Administered 2017-11-19: 11:00:00 via INTRAVENOUS

## 2017-11-19 MED ORDER — ATENOLOL 50 MG PO TABS
50.0000 mg | ORAL_TABLET | Freq: Once | ORAL | Status: AC
  Administered 2017-11-19: 50 mg via ORAL
  Filled 2017-11-19: qty 1

## 2017-11-19 MED ORDER — ONDANSETRON HCL 4 MG/2ML IJ SOLN: 4.0000 mg | Freq: Four times a day (QID) | INTRAMUSCULAR | Status: DC | PRN

## 2017-11-19 MED ORDER — ASPIRIN 81 MG PO CHEW
81.0000 mg | CHEWABLE_TABLET | ORAL | Status: AC
  Administered 2017-11-19: 81 mg via ORAL

## 2017-11-19 MED ORDER — LIDOCAINE HCL (PF) 1 % IJ SOLN
INTRAMUSCULAR | Status: AC
  Filled 2017-11-19: qty 30

## 2017-11-19 MED ORDER — HEPARIN (PORCINE) IN NACL 2-0.9 UNITS/ML
INTRAMUSCULAR | Status: AC | PRN
  Administered 2017-11-19: 500 mL

## 2017-11-19 SURGICAL SUPPLY — 6 items
CATH BALLN WEDGE 5F 110CM (CATHETERS) ×2 IMPLANT
PACK CARDIAC CATHETERIZATION (CUSTOM PROCEDURE TRAY) ×2 IMPLANT
SHEATH RAIN 4/5FR (SHEATH) ×2 IMPLANT
TRANSDUCER W/STOPCOCK (MISCELLANEOUS) ×2 IMPLANT
TUBING ART PRESS 72  MALE/FEM (TUBING) ×1
TUBING ART PRESS 72 MALE/FEM (TUBING) ×1 IMPLANT

## 2017-11-19 NOTE — Telephone Encounter (Signed)
Called b/c Dr. Aundra Dubin says pt needs f/u post cath. Dr. Aundra Dubin has opening tomorrow.

## 2017-11-19 NOTE — Discharge Instructions (Signed)
Angiogram, Care After This sheet gives you information about how to care for yourself after your procedure. Your doctor may also give you more specific instructions. If you have problems or questions, contact your doctor. Follow these instructions at home: Insertion site care  Follow instructions from your doctor about how to take care of your long, thin tube (catheter) insertion area. Make sure you: ? Wash your hands with soap and water before you change your bandage (dressing). If you cannot use soap and water, use hand sanitizer. ? Change your bandage as told by your doctor. ? Leave stitches (sutures), skin glue, or skin tape (adhesive) strips in place. They may need to stay in place for 2 weeks or longer. If tape strips get loose and curl up, you may trim the loose edges.   Do not take baths, swim, or use a hot tub for 3 days.  You may shower 24 hours after the procedure or as told by your doctor. ? Gently wash the area with plain soap and water. ? Pat the area dry with a clean towel. ? Do not rub the area. This may cause bleeding.  Do not apply powder or lotion to the area. Keep the area clean and dry.  Check your insertion area every day for signs of infection. Check for: ? More redness, swelling, or pain. ? Fluid or blood. ? Warmth. ? Pus or a bad smell. Activity  Rest as told by your doctor, usually for 1-2 days.  Do not lift anything that is heavier than 10 lbs. (4.5 kg) or as told by your doctor.  Do not drive for 24 hours if you were given a medicine to help you relax (sedative).  Do not drive or use heavy machinery while taking prescription pain medicine. General instructions  Go back to your normal activities as told by your doctor, usually in about a week. Ask your doctor what activities are safe for you.  If the insertion area starts to bleed, lie flat and put pressure on the area. If the bleeding does not stop, get help right away. This is an emergency.  Take  over-the-counter and prescription medicines only as told by your doctor.  Keep all follow-up visits as told by your doctor. This is important. Contact a doctor if:  You have a fever.  You have chills.  You have more redness, swelling, or pain around your insertion area.  You have fluid or blood coming from your insertion area.  The insertion area feels warm to the touch.  You have pus or a bad smell coming from your insertion area.  You have more bruising around the insertion area.  Blood collects in the tissue around the insertion area (hematoma) that may be painful to the touch. Get help right away if:  You have a lot of pain in the insertion area.  The insertion area swells very fast.  The insertion area is bleeding, and the bleeding does not stop after holding steady pressure on the area.  The area near or just beyond the insertion area becomes pale, cool, tingly, or numb. These symptoms may be an emergency. Do not wait to see if the symptoms will go away. Get medical help right away. Call your local emergency services (911 in the [place] Do not drive yourself to the hospital. Summary  After the procedure, it is common to have bruising and tenderness at the long, thin tube insertion area.  After the procedure, it is important to rest and  drink plenty of fluids.  Do not take baths, swim, or use a hot tub until your doctor says it is okay to do so. You may shower 24-48 hours after the procedure or as told by your doctor.  If the insertion area starts to bleed, lie flat and put pressure on the area. If the bleeding does not stop, get help right away. This is an emergency. This information is not intended to replace advice given to you by your health care provider. Make sure you discuss any questions you have with your health care provider. Document Released: 10/17/2008 Document Revised: 07/15/2016 Document Reviewed: 07/15/2016 Elsevier Interactive Patient Education  2017  [employer].

## 2017-11-19 NOTE — Interval H&P Note (Signed)
History and Physical Interval Note:  11/19/2017 1:34 PM  Kimberly Irwin  has presented today for surgery, with the diagnosis of pulmonary hypertension  The various methods of treatment have been discussed with the patient and family. After consideration of risks, benefits and other options for treatment, the patient has consented to  Procedure(s): RIGHT HEART CATH (N/A) as a surgical intervention .  The patient's history has been reviewed, patient examined, no change in status, stable for surgery.  I have reviewed the patient's chart and labs.  Questions were answered to the patient's satisfaction.     Manan Olmo [employer]

## 2017-11-20 ENCOUNTER — Encounter (HOSPITAL_COMMUNITY): Payer: Medicare Other | Admitting: Cardiology

## 2017-11-20 ENCOUNTER — Encounter (HOSPITAL_COMMUNITY): Payer: Self-pay | Admitting: Cardiology

## 2017-12-03 NOTE — Progress Notes (Signed)
Thanks [name],  Needs Office visit to start [place] and [place].  Please arrange with me or an NP ASAP

## 2017-12-04 ENCOUNTER — Ambulatory Visit (HOSPITAL_COMMUNITY)
Admission: RE | Admit: 2017-12-04 | Discharge: 2017-12-04 | Disposition: A | Discharge status: Home / Self Care | Payer: Medicare Other | Source: Ambulatory Visit | Attending: Cardiology | Admitting: Cardiology

## 2017-12-04 ENCOUNTER — Other Ambulatory Visit: Payer: Self-pay | Admitting: Family Medicine

## 2017-12-04 ENCOUNTER — Other Ambulatory Visit: Payer: Self-pay

## 2017-12-04 ENCOUNTER — Telehealth (HOSPITAL_COMMUNITY): Payer: Self-pay | Admitting: *Deleted

## 2017-12-04 VITALS — BP 128/70 | HR 71 | Wt 176.0 lb

## 2017-12-04 DIAGNOSIS — Z87891 Personal history of nicotine dependence: Secondary | ICD-10-CM | POA: Diagnosis not present

## 2017-12-04 DIAGNOSIS — Z79890 Hormone replacement therapy (postmenopausal): Secondary | ICD-10-CM | POA: Insufficient documentation

## 2017-12-04 DIAGNOSIS — E611 Iron deficiency: Secondary | ICD-10-CM

## 2017-12-04 DIAGNOSIS — I2510 Atherosclerotic heart disease of native coronary artery without angina pectoris: Secondary | ICD-10-CM | POA: Insufficient documentation

## 2017-12-04 DIAGNOSIS — Z9981 Dependence on supplemental oxygen: Secondary | ICD-10-CM | POA: Diagnosis not present

## 2017-12-04 DIAGNOSIS — I10 Essential (primary) hypertension: Secondary | ICD-10-CM | POA: Insufficient documentation

## 2017-12-04 DIAGNOSIS — Z7951 Long term (current) use of inhaled steroids: Secondary | ICD-10-CM | POA: Diagnosis not present

## 2017-12-04 DIAGNOSIS — J9611 Chronic respiratory failure with hypoxia: Secondary | ICD-10-CM | POA: Diagnosis not present

## 2017-12-04 DIAGNOSIS — D869 Sarcoidosis, unspecified: Secondary | ICD-10-CM | POA: Diagnosis not present

## 2017-12-04 DIAGNOSIS — Z7982 Long term (current) use of aspirin: Secondary | ICD-10-CM | POA: Diagnosis not present

## 2017-12-04 DIAGNOSIS — I272 Pulmonary hypertension, unspecified: Secondary | ICD-10-CM

## 2017-12-04 DIAGNOSIS — Z803 Family history of malignant neoplasm of breast: Secondary | ICD-10-CM | POA: Insufficient documentation

## 2017-12-04 DIAGNOSIS — E785 Hyperlipidemia, unspecified: Secondary | ICD-10-CM | POA: Diagnosis not present

## 2017-12-04 DIAGNOSIS — Z79899 Other long term (current) drug therapy: Secondary | ICD-10-CM | POA: Insufficient documentation

## 2017-12-04 DIAGNOSIS — D649 Anemia, unspecified: Secondary | ICD-10-CM | POA: Insufficient documentation

## 2017-12-04 DIAGNOSIS — E039 Hypothyroidism, unspecified: Secondary | ICD-10-CM | POA: Diagnosis not present

## 2017-12-04 DIAGNOSIS — Z8249 Family history of ischemic heart disease and other diseases of the circulatory system: Secondary | ICD-10-CM | POA: Insufficient documentation

## 2017-12-04 DIAGNOSIS — J439 Emphysema, unspecified: Secondary | ICD-10-CM | POA: Diagnosis not present

## 2017-12-04 LAB — IRON AND TIBC
IRON: 108 ug/dL (ref 28–170)
Saturation Ratios: 32 % — ABNORMAL HIGH (ref 10.4–31.8)
TIBC: 340 ug/dL (ref 250–450)
UIBC: 232 ug/dL

## 2017-12-04 LAB — CBC
HEMATOCRIT: 45.6 % (ref 36.0–46.0)
HEMOGLOBIN: 15.2 g/dL — AB (ref 12.0–15.0)
MCH: 30.3 pg (ref 26.0–34.0)
MCHC: 33.3 g/dL (ref 30.0–36.0)
MCV: 91 fL (ref 78.0–100.0)
Platelets: 195 10*3/uL (ref 150–400)
RBC: 5.01 MIL/uL (ref 3.87–5.11)
RDW: 13.9 % (ref 11.5–15.5)
WBC: 10.2 10*3/uL (ref 4.0–10.5)

## 2017-12-04 LAB — FERRITIN: FERRITIN: 140 ng/mL (ref 11–307)

## 2017-12-04 LAB — BRAIN NATRIURETIC PEPTIDE: B NATRIURETIC PEPTIDE 5: 79.5 pg/mL (ref 0.0–100.0)

## 2017-12-04 NOTE — Patient Instructions (Addendum)
Start Adcirca 20 mg (1 tab) daily  Start Opsumit 10 mg (1 tab) daily  A chest x-ray takes a picture of the organs and structures inside the chest, including the heart, lungs, and blood vessels. This test can show several things, including, whether the heart is enlarges; whether fluid is building up in the lungs; and whether pacemaker / defibrillator leads are still in place.  Labs drawn today (if we do not call you, then your lab work was stable)   We did a 6 minute walk test   Your physician recommends that you schedule a follow-up appointment in: 3 weeks with Doroteo Bradford, Pharm D    Your physician has requested that you have an echocardiogram. Echocardiography is a painless test that uses sound waves to create images of your heart. It provides your doctor with information about the size and shape of your heart and how well your heart's chambers and valves are working. This procedure takes approximately one hour. There are no restrictions for this procedure.    Your physician recommends that you schedule a follow-up appointment in: 6 weeks with Dr. Aundra Dubin   an a echocardiogram

## 2017-12-04 NOTE — Progress Notes (Signed)
Pt attempted 6 minute walk test. Pt walked 400 ft (122 meters) with 1:38 seconds remaining. O2 sats ranged from 91%-74% on 3 LNC, and HR ranged from 72-103. No new orders at this time.

## 2017-12-04 NOTE — Telephone Encounter (Signed)
Completed PA for pt's Adcirca, medication was approved 11/27/17 through 08/03/18, ref # [id number], [facility] pharmacy aware

## 2017-12-05 LAB — ANTI-SCLERODERMA ANTIBODY: Scleroderma (Scl-70) (ENA) Antibody, IgG: <0.2 AI (ref 0.0–0.9)

## 2017-12-05 LAB — RHEUMATOID FACTOR

## 2017-12-05 LAB — CENTROMERE ANTIBODIES

## 2017-12-05 LAB — ANA W/REFLEX IF POSITIVE: Anti Nuclear Antibody(ANA): NEGATIVE

## 2017-12-05 LAB — HIV ANTIBODY (ROUTINE TESTING W REFLEX): HIV SCREEN 4TH GENERATION: NONREACTIVE

## 2017-12-05 NOTE — Progress Notes (Signed)
PCP: Dr. Charlett Blake Pulmonary: Dr. Lake Bells Cardiology: Dr. Aundra Dubin  72 yo with history of sarcoidosis and emphysema was referred by Dr Lake Bells for evaluation of pulmonary hypertension.  She has a diagnosis of sarcoidosis, but CT chest in 3/19 did not show marked parenchymal disease.  PFTs showed very low DLCO.  She had RHC done in 4/19 with moderate pulmonary hypertension and very high PVR.    She is short of breath walking short distances.  This has been slowly progressive.  She is now wearing 2 L [place] at all times.  She does not smoke, says she quit > 20 years ago.  No chest pain.  No orthopnea/PND.  6 minute walk today was quite limited at 122 m.    6 minute walk (5/19): 122 m  Labs (3/19): LDL 53 Labs (4/19): hgb 8.5, K 4.8, creatinine 1.06  ECG (4/19, personally reviewed):  NSR, inferolateral TWIs  PMH: 1. Sarcoidosis: On home oxygen.  - PFTs (3/19): FEV1 84%, FVC 84%, ratio 71%, TLC 75%, DLCO 25% - CT (3/19) with mild fibrotic changes in the lung bases.  2. Emphysema: Moderate emphysema on CT chest 3/19.  3. CAD: Calcification of coronaries on CT chest in 3/19.  - Cardiolite in 1/19: EF 65%, no ischemia/infarction.  4. HTN 5. Hyperlipidemia 6. Hypothyroidism 7. Anemia 8. Pulmonary hypertension:  - Echo (9/17): EF 60-65%, PASP 44 mmHg.  - RHC (4/19): mean RA 2, PA 66/16 mean 34, mean PCWP 3, PVR 8.3 WU, CI 2.04  Social History   Socioeconomic History  . Marital status: Widowed    Spouse name: Not on file  . Number of children: 2  . Years of education: Not on file  . Highest education level: Not on file  Occupational History  . Not on file  Social Needs  . Financial resource strain: Not on file  . Food insecurity:    Worry: Not on file    Inability: Not on file  . Transportation needs:    Medical: Not on file    Non-medical: Not on file  Tobacco Use  . Smoking status: Former Smoker    Packs/day: 0.50    Years: 32.00    Pack years: 16.00    Types: Cigarettes    Last  attempt to quit: 07/22/1994    Years since quitting: 23.3  . Smokeless tobacco: Never Used  Substance and Sexual Activity  . Alcohol use: No    Alcohol/week: 0.0 oz  . Drug use: No  . Sexual activity: Never  Lifestyle  . Physical activity:    Days per week: Not on file    Minutes per session: Not on file  . Stress: Not on file  Relationships  . Social connections:    Talks on phone: Not on file    Gets together: Not on file    Attends religious service: Not on file    Active member of club or organization: Not on file    Attends meetings of clubs or organizations: Not on file    Relationship status: Not on file  . Intimate partner violence:    Fear of current or ex partner: Not on file    Emotionally abused: Not on file    Physically abused: Not on file    Forced sexual activity: Not on file  Other Topics Concern  . Not on file  Social History Narrative   Married 40 years and has 2 children (2 daughters)   Alcohol Use - no  Former Smoker - she quit 10 years ago, started when she was 10 and has had varying level of use of tobacco products from 1/2 pack to 3 packs per day.   Family History  Problem Relation Age of Onset  . Cancer Mother 72       Breast  . Heart disease Mother   . Hypertension Mother   . Hyperlipidemia Mother   . Heart attack Mother   . Asthma Maternal Grandmother    ROS: All systems reviewed and negative except as per HPI.   Current Outpatient Medications  Medication Sig Dispense Refill  . albuterol (PROVENTIL HFA;VENTOLIN HFA) 108 (90 Base) MCG/ACT inhaler Inhale 2 puffs into the lungs every 6 (six) hours as needed for wheezing or shortness of breath. 1 Inhaler 6  . albuterol (PROVENTIL) (2.5 MG/3ML) 0.083% nebulizer solution Take 3 mLs (2.5 mg total) by nebulization every 6 (six) hours as needed for wheezing or shortness of breath. 30 mL 4  . aspirin EC 81 MG tablet Take 81 mg by mouth at bedtime.     Marland Kitchen atenolol (TENORMIN) 25 MG tablet Take 1 tablet  (25 mg total) by mouth at bedtime. Take the 50 mg Atenolol in am and the 25 mg in pm 90 tablet 3  . atenolol (TENORMIN) 50 MG tablet TAKE 1 TABLET (50 MG TOTAL) BY MOUTH DAILY. (Patient taking differently: Take 50 mg by mouth daily. ) 90 tablet 1  . BESIVANCE 0.6 % SUSP Place 1 drop into the left eye See admin instructions. INSTILL 1 DROP INTO LEFT 4 TIMES DAILY x 2 DAYS AFTER EYE INJECTION  12  . budesonide-formoterol (SYMBICORT) 160-4.5 MCG/ACT inhaler Inhale 2 puffs into the lungs 2 (two) times daily. 1 Inhaler 0  . Calcium Carbonate-Vit D-Min (CALCIUM 1200 PO) Take 1 tablet by mouth daily.     . Cholecalciferol (VITAMIN D) 2000 UNITS tablet Take 2,000 Units by mouth at bedtime.     . Cinnamon 500 MG capsule Take 2,000 mg by mouth daily.      Marland Kitchen CINNAMON PO Take 2 capsules by mouth daily. 1000 MG PER CAPSULE    . citalopram (CELEXA) 10 MG tablet TAKE 1 TABLET BY MOUTH EVERY DAY (Patient taking differently: Take 10 mg by mouth at bedtime. ) 90 tablet 1  . Coenzyme Q10 200 MG capsule Take 200 mg by mouth at bedtime.     . fexofenadine-pseudoephedrine (ALLEGRA-D 24) 180-240 MG 24 hr tablet Take 1 tablet by mouth daily.    Marland Kitchen gabapentin (NEURONTIN) 300 MG capsule Take 1 capsule (300 mg total) by mouth at bedtime. 90 capsule 2  . Glucosamine HCl (GLUCOSAMINE PO) Take 1 tablet by mouth at bedtime.     . hydrochlorothiazide (HYDRODIURIL) 25 MG tablet TAKE 1 TABLET (25 MG TOTAL) BY MOUTH DAILY. 90 tablet 1  . ketoconazole (NIZORAL) 2 % shampoo Apply 1 application topically 2 (two) times a week. (Patient taking differently: Apply 1 application topically every 14 (fourteen) days. ONCE EVERY 2 WEEKS PER PATIENT) 120 mL 03  . levothyroxine (SYNTHROID, LEVOTHROID) 112 MCG tablet TAKE 1 TABLET (112 MCG TOTAL) BY MOUTH DAILY BEFORE BREAKFAST. 90 tablet 1  . mometasone (NASONEX) 50 MCG/ACT nasal spray Place 2 sprays daily as needed into the nose. (Patient taking differently: Place 2 sprays into the nose daily as  needed (FOR ALLERGIES.). ) 17 g 12  . mupirocin ointment (BACTROBAN) 2 % Apply twice daily to bridge of the nose. (Patient taking differently: Apply 1 application topically  2 (two) times daily as needed (for redness on the bridge of nose). ) 22 g 0  . Omega-3 Fatty Acids (FISH OIL) 1200 MG CAPS Take 2,400 mg by mouth daily.     . potassium chloride (KLOR-CON 10) 10 MEQ tablet Take 1 tablet (10 mEq total) by mouth daily. 30 tablet 3  . rosuvastatin (CRESTOR) 20 MG tablet TAKE 1 TABLET (20 MG TOTAL) BY MOUTH DAILY. (Patient taking differently: Take 20 mg by mouth at bedtime. ) 90 tablet 2  . traMADol (ULTRAM) 50 MG tablet Take 2 tablets (100 mg total) by mouth every 6 (six) hours as needed for moderate pain or severe pain. 120 tablet 0  . vitamin B-12 (CYANOCOBALAMIN) 1000 MCG tablet Take 1,000 mcg by mouth daily.    Marland Kitchen omeprazole (PRILOSEC) 40 MG capsule TAKE 1 CAPSULE (40 MG TOTAL) BY MOUTH DAILY. 90 capsule 1   No current facility-administered medications for this encounter.    BP 128/70   Pulse 71   Wt 176 lb (79.8 kg) Comment: home scale  SpO2 91% Comment: 4L  BMI 31.18 kg/m  General: NAD Neck: No JVD, no thyromegaly or thyroid nodule.  Lungs: Distant breath sounds.  CV: Nondisplaced PMI.  Heart regular S1/S2, no S3/S4, no murmur.  No peripheral edema.  No carotid bruit.  Normal pedal pulses.  Abdomen: Soft, nontender, no hepatosplenomegaly, no distention.  Skin: Intact without lesions or rashes.  Neurologic: Alert and oriented x 3.  Psych: Normal affect. Extremities: No clubbing or cyanosis.  HEENT: Normal.   Assessment/Plan: 1. Pulmonary hypertension: Cath 4/19 with moderate PAH, very high PVR. Right and left heart filling pressures were low.  PFTs in 3/19 showed mild restriction with very low DLCO consistent with pulmonary vascular disease.  High resolution CT in 3/19 showed mild fibrotic changes at the lung bases and a degree of emphysema.  I do not think CT findings can explain  her PH.  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  6 minute walk today was poor, 122 m.  - She needs a V/Q scan to rule out chronic PE, will arrange.  - Send ANA, anti-SCL70, anti-Jo-1, anti-centromere, RF, HIV.  - She will need a sleep study with the pulmonary department.  - Repeat echo and check BNP.  - Treatment of Group 5 PH (sardoidosis-related) has not been fully established.  However, as parenchymal disease does not appear particularly bad on CT, I think a trial of pulmonary vasodilators is reasonable.  She is going to start on Adcirca 20 mg daily then Opsumit 10 mg daily after 7-10 days.  Will titrate Adcirca at followup.  2. Chronic hypoxemic respiratory failure: She is on home oxygen.  As her parenchymal disease does not appear marked, I wonder if pulmonary hypertension is playing a role in her low oxygen saturation.   - Continue home oxygen, watch for desaturation with initiation of pulmonary vasodilators.  3. Anemia: Cause uncertain. Will check Fe studies and send CBC today.  4. Hyperlipidemia: Good lipids 3/19.   Followup in 3 wks in HF pharmacy clinic for [facility] med titration.  See me in 6 wks.   [name] 12/05/2017

## 2017-12-07 ENCOUNTER — Telehealth (HOSPITAL_COMMUNITY): Payer: Self-pay | Admitting: *Deleted

## 2017-12-07 NOTE — Telephone Encounter (Signed)
Pts daughter Kimberly Irwin left VM stating the pharmacy called her and told her Kimberly Irwin would cost $2,000 for a 90 day supply and there are currently no funds for the assistance program. She requested a call back.  Message routed to [facility] nurse Shirley Muscat to follow up with Kimberly Irwin.

## 2017-12-08 NOTE — Telephone Encounter (Signed)
Called daughter back today. I sent in insurance cards to [facility]. If medication is not affordable I will speak with Dr. Aundra Dubin prescribing a different medication. Pt daughter aware, agreeable to plan with no further orders.  Per Dr. Aundra Dubin we will do 5 mg Lateris.

## 2017-12-09 ENCOUNTER — Ambulatory Visit (HOSPITAL_COMMUNITY)
Admission: RE | Admit: 2017-12-09 | Discharge: 2017-12-09 | Disposition: A | Discharge status: Home / Self Care | Payer: Medicare Other | Source: Ambulatory Visit | Attending: Cardiology | Admitting: Cardiology

## 2017-12-09 ENCOUNTER — Encounter (HOSPITAL_COMMUNITY): Payer: Self-pay

## 2017-12-09 DIAGNOSIS — I272 Pulmonary hypertension, unspecified: Secondary | ICD-10-CM | POA: Insufficient documentation

## 2017-12-09 DIAGNOSIS — R0602 Shortness of breath: Secondary | ICD-10-CM | POA: Diagnosis not present

## 2017-12-09 MED ORDER — TECHNETIUM TO 99M ALBUMIN AGGREGATED
4.0000 | Freq: Once | INTRAVENOUS | Status: AC | PRN
  Administered 2017-12-09: 4 via INTRAVENOUS

## 2017-12-09 MED ORDER — TECHNETIUM TC 99M DIETHYLENETRIAME-PENTAACETIC ACID
30.0000 | Freq: Once | INTRAVENOUS | Status: AC | PRN
  Administered 2017-12-09: 30 via INTRAVENOUS

## 2017-12-10 ENCOUNTER — Encounter: Payer: Self-pay | Admitting: Pulmonary Disease

## 2017-12-10 ENCOUNTER — Ambulatory Visit (INDEPENDENT_AMBULATORY_CARE_PROVIDER_SITE_OTHER): Payer: Medicare Other | Admitting: Pulmonary Disease

## 2017-12-10 ENCOUNTER — Other Ambulatory Visit: Payer: Self-pay | Admitting: Family Medicine

## 2017-12-10 VITALS — BP 116/74 | HR 63 | Ht 63.0 in | Wt 176.0 lb

## 2017-12-10 DIAGNOSIS — D869 Sarcoidosis, unspecified: Secondary | ICD-10-CM | POA: Diagnosis not present

## 2017-12-10 DIAGNOSIS — J9611 Chronic respiratory failure with hypoxia: Secondary | ICD-10-CM

## 2017-12-10 DIAGNOSIS — I270 Primary pulmonary hypertension: Secondary | ICD-10-CM

## 2017-12-10 DIAGNOSIS — J439 Emphysema, unspecified: Secondary | ICD-10-CM

## 2017-12-10 MED ORDER — SILDENAFIL CITRATE 20 MG PO TABS: 20.0000 mg | ORAL_TABLET | Freq: Three times a day (TID) | ORAL | 5 refills | Status: DC

## 2017-12-10 NOTE — Progress Notes (Signed)
Subjective:   PATIENT ID: Kimberly Irwin GENDER: female DOB: 04/13/1946, MRN: [id number]   Synopsis: Former patient of Dr. Joya Irwin and Dr. Corrie Irwin who has sarcoidosis and centrilobular emphysema.  HPI  Chief Complaint  Patient presents with  . Follow-up    F/U for after heart cath. Per patient, her breathing has gotten worse since last visit. Increased SOB since last visit.    Female is still very short of breath.  She still very frustrated with having to carry oxygen everywhere.  She is extremely frustrated with the cost of her medicines.  She had a right heart catheterization that showed pulmonary hypertension.  She has not had new bronchitis or pneumonia symptoms.  She remains compliant with her oxygen therapy.  Past Medical History:  Diagnosis Date  . Acute hip pain 10/26/2012  . Bronchitis 09/04/2016  . Bursitis of left hip 10/26/2012  . Cardiomegaly 07/13/2016  . Chronic low back pain   . Coronary artery calcification 07/03/2016  . DDD (degenerative disc disease)    L3-4 with facet arthropathy and stenosis  . Degenerative spondylolisthesis    L4-5 grade 1 with stenosis  . GERD (gastroesophageal reflux disease)   . Heart murmur    as an infant  . History of hiatal hernia   . Hyperlipidemia   . Hypertension   . Hypothyroidism   . Hypothyroidism 10/28/2015  . Hypoxia, sleep related 10/21/2016  . Pain in joint, lower leg 10/26/2012   left   . Pneumonia    as a child several times.   . Sarcoidosis   . SOB (shortness of breath) 04/03/2016  . Tremors of nervous system         Review of Systems  Constitutional: Negative for chills, diaphoresis and malaise/fatigue.  HENT: Negative for congestion and ear discharge.   Respiratory: Positive for shortness of breath. Negative for cough, sputum production, wheezing and stridor.   Cardiovascular: Negative for chest pain, palpitations, claudication and leg swelling.      Objective:  Physical Exam   Vitals:   12/10/17  1439  BP: 116/74  Pulse: 63  SpO2: 93%  Weight: 176 lb (79.8 kg)  Height: _0  (1.6 m)   3Lpm Pulse at rest   Gen: chronically ill appearing HENT: OP clear, TM's clear, neck supple PULM: CTA B, normal percussion CV: RRR, no mgr, trace edema GI: BS+, soft, nontender Derm: no cyanosis or rash Psyche: angry    CBC    Component Value Date/Time   WBC 10.2 12/04/2017 1248   RBC 5.01 12/04/2017 1248   HGB 15.2 (H) 12/04/2017 1248   HCT 45.6 12/04/2017 1248   PLT 195 12/04/2017 1248   MCV 91.0 12/04/2017 1248   MCH 30.3 12/04/2017 1248   MCHC 33.3 12/04/2017 1248   RDW 13.9 12/04/2017 1248   LYMPHSABS 2.5 10/19/2017 1459   MONOABS 1.0 10/19/2017 1459   EOSABS 0.1 10/19/2017 1459   BASOSABS 0.1 10/19/2017 1459     Chest imaging: CT chest October 2017 showed no PE . Mild emphysematous changes.  Images independently reviewed by me showing some air trapping and mosaicism.  Some cysts noted in the lower lobes. March 2019 high-resolution CT scan of the chest images independently reviewed, nonspecific interstitial changes in the right greater than left base which are very mild in distribution and overall severity, upper lobe predominant mild to moderate centrilobular emphysema noted, bilateral pulmonary nodules, largest 5 mm, aortic atherosclerosis noted 12/2017 V/Q Scan IMPRESSION: Normal perfusion  lung scan.  Minimally diminished ventilation to the RIGHT apex.  PFT: PFT (05/2016) Ratio 70% FEV1 1.59 72%, DLCO 35% (flow volume loop consistent with airflow obstruction) PFT (10/2017) ratio 71%, FEV1 1.83 L 84%, FVC 2.43 L 84%, total lung capacity 3.83 L 75%, DLCO 6.15 25%  Labs:  Path:  Echo: 2-D echo on 04/09/2016 showed an EF of 97-94%, grade 1 diastolic dysfunction. Pulmonary artery pressure 44 mmHg  Heart Catheterization: 11/2017 RHC RA mean 2 RV 66/5 PA 66/16, mean 34 PCWP mean 3 Oxygen saturations: PA 60% AO 92% Cardiac Output (Fick) 3.73 Cardiac Index (Fick) 2.04  PVR 8.3 WU  Exhaled NO: FeNO is 20ppm 05/2016   Other: Stress Myoview 2011 neg for ischemia .  Records from her visit with cardiology reviewed where she was started on 2 medicines for pulmonary hypertension.    Assessment & Plan:   Chronic respiratory failure with hypoxia ([place])  Pulmonary emphysema, unspecified emphysema type ([place])  Sarcoidosis stage I  Primary pulmonary hypertension ([place])  Discussion: Kimberly Irwin has worsening pulmonary hypertension despite no evidence of worsening of her sarcoidosis or centrilobular emphysema.  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  She needs to be on combination therapy.  She is frustrated with the cost of the medicines that have been prescribed.  We need to work with her to help make them more affordable.  Plan: Pulmonary hypertension: Start Letairis 5 mg daily Start Revatio 19m three times a day We are going to start you on a medication called Letairis (Ambrisentan) or your pulmonary hypertension.  We will start the medication at 5 mg daily and if you are doing well we will increase it to 10 mg daily.  We will not ensure your liver function tests every 2 weeks for the first month followed by monthly testing for the first 3 months. If those tests are okay check your liver function tests every 3 months as long as you take the medication.  You should not take this medication if you are pregnant.  We are also going to start a medicine called Revatio.  Do not take this medicine with nitrates (nitroglycerine)  Follow up in 4 weeks with an NP for blood work (liver function testing)  Greater than 50% of this 27-minute visit spent face-to-face   Current Outpatient Medications:  .  albuterol (PROVENTIL HFA;VENTOLIN HFA) 108 (90 Base) MCG/ACT inhaler, Inhale 2 puffs into the lungs every 6 (six) hours as needed for wheezing or shortness of breath., Disp: 1 Inhaler, Rfl: 6 .  albuterol (PROVENTIL) (2.5 MG/3ML) 0.083% nebulizer solution, Take 3 mLs  (2.5 mg total) by nebulization every 6 (six) hours as needed for wheezing or shortness of breath., Disp: 30 mL, Rfl: 4 .  aspirin EC 81 MG tablet, Take 81 mg by mouth at bedtime. , Disp: , Rfl:  .  atenolol (TENORMIN) 25 MG tablet, Take 1 tablet (25 mg total) by mouth at bedtime. Take the 50 mg Atenolol in am and the 25 mg in pm, Disp: 90 tablet, Rfl: 3 .  atenolol (TENORMIN) 50 MG tablet, TAKE 1 TABLET (50 MG TOTAL) BY MOUTH DAILY. (Patient taking differently: Take 50 mg by mouth daily. ), Disp: 90 tablet, Rfl: 1 .  BESIVANCE 0.6 % SUSP, Place 1 drop into the left eye See admin instructions. INSTILL 1 DROP INTO LEFT 4 TIMES DAILY x 2 DAYS AFTER EYE INJECTION, Disp: , Rfl: 12 .  budesonide-formoterol (SYMBICORT) 160-4.5 MCG/ACT inhaler, Inhale 2 puffs into the lungs 2 (two) times  daily., Disp: 1 Inhaler, Rfl: 0 .  Calcium Carbonate-Vit D-Min (CALCIUM 1200 PO), Take 1 tablet by mouth daily. , Disp: , Rfl:  .  Cholecalciferol (VITAMIN D) 2000 UNITS tablet, Take 2,000 Units by mouth at bedtime. , Disp: , Rfl:  .  Cinnamon 500 MG capsule, Take 2,000 mg by mouth daily.  , Disp: , Rfl:  .  CINNAMON PO, Take 2 capsules by mouth daily. 1000 MG PER CAPSULE, Disp: , Rfl:  .  citalopram (CELEXA) 10 MG tablet, TAKE 1 TABLET BY MOUTH EVERY DAY (Patient taking differently: Take 10 mg by mouth at bedtime. ), Disp: 90 tablet, Rfl: 1 .  Coenzyme Q10 200 MG capsule, Take 200 mg by mouth at bedtime. , Disp: , Rfl:  .  fexofenadine-pseudoephedrine (ALLEGRA-D 24) 180-240 MG 24 hr tablet, Take 1 tablet by mouth daily., Disp: , Rfl:  .  gabapentin (NEURONTIN) 300 MG capsule, Take 1 capsule (300 mg total) by mouth at bedtime., Disp: 90 capsule, Rfl: 2 .  Glucosamine HCl (GLUCOSAMINE PO), Take 1 tablet by mouth at bedtime. , Disp: , Rfl:  .  hydrochlorothiazide (HYDRODIURIL) 25 MG tablet, TAKE 1 TABLET (25 MG TOTAL) BY MOUTH DAILY., Disp: 90 tablet, Rfl: 1 .  ketoconazole (NIZORAL) 2 % shampoo, Apply 1 application topically  2 (two) times a week. (Patient taking differently: Apply 1 application topically every 14 (fourteen) days. ONCE EVERY 2 WEEKS PER PATIENT), Disp: 120 mL, Rfl: 03 .  levothyroxine (SYNTHROID, LEVOTHROID) 112 MCG tablet, TAKE 1 TABLET (112 MCG TOTAL) BY MOUTH DAILY BEFORE BREAKFAST., Disp: 90 tablet, Rfl: 1 .  mometasone (NASONEX) 50 MCG/ACT nasal spray, Place 2 sprays daily as needed into the nose. (Patient taking differently: Place 2 sprays into the nose daily as needed (FOR ALLERGIES.). ), Disp: 17 g, Rfl: 12 .  mupirocin ointment (BACTROBAN) 2 %, Apply twice daily to bridge of the nose. (Patient taking differently: Apply 1 application topically 2 (two) times daily as needed (for redness on the bridge of nose). ), Disp: 22 g, Rfl: 0 .  Omega-3 Fatty Acids (FISH OIL) 1200 MG CAPS, Take 2,400 mg by mouth daily. , Disp: , Rfl:  .  omeprazole (PRILOSEC) 40 MG capsule, TAKE 1 CAPSULE (40 MG TOTAL) BY MOUTH DAILY., Disp: 90 capsule, Rfl: 1 .  potassium chloride (KLOR-CON 10) 10 MEQ tablet, Take 1 tablet (10 mEq total) by mouth daily., Disp: 30 tablet, Rfl: 3 .  rosuvastatin (CRESTOR) 20 MG tablet, TAKE 1 TABLET (20 MG TOTAL) BY MOUTH DAILY. (Patient taking differently: Take 20 mg by mouth at bedtime. ), Disp: 90 tablet, Rfl: 2 .  traMADol (ULTRAM) 50 MG tablet, Take 2 tablets (100 mg total) by mouth every 6 (six) hours as needed for moderate pain or severe pain., Disp: 120 tablet, Rfl: 0 .  vitamin B-12 (CYANOCOBALAMIN) 1000 MCG tablet, Take 1,000 mcg by mouth daily., Disp: , Rfl:  .  sildenafil (REVATIO) 20 MG tablet, Take 1 tablet (20 mg total) by mouth 3 (three) times daily., Disp: 90 tablet, Rfl: 5

## 2017-12-10 NOTE — Patient Instructions (Signed)
Pulmonary hypertension: Start Letairis 5 mg daily Start Revatio 31m three times a day We are going to start you on a medication called Letairis (Ambrisentan) or your pulmonary hypertension.  We will start the medication at 5 mg daily and if you are doing well we will increase it to 10 mg daily.  We will not ensure your liver function tests every 2 weeks for the first month followed by monthly testing for the first 3 months. If those tests are okay check your liver function tests every 3 months as long as you take the medication.  You should not take this medication if you are pregnant.  We are also going to start a medicine called Revatio.  Do not take this medicine with nitrates (nitroglycerine)  Follow up in 4 weeks with an NP for blood work (liver function testing)

## 2017-12-11 ENCOUNTER — Telehealth: Payer: Self-pay | Admitting: Pulmonary Disease

## 2017-12-11 ENCOUNTER — Other Ambulatory Visit: Payer: Self-pay

## 2017-12-11 DIAGNOSIS — J9611 Chronic respiratory failure with hypoxia: Secondary | ICD-10-CM

## 2017-12-11 MED ORDER — AMBRISENTAN 5 MG PO TABS: 5.0000 mg | ORAL_TABLET | Freq: Every day | ORAL | 11 refills | Status: DC

## 2017-12-11 NOTE — Telephone Encounter (Signed)
Spoke with Kimberly Irwin she is requesting a full face mask,  one that covers her nose and mouth because she is not breathing through her nose. Can we place order to [facility]? Dr. Lake Bells please advise.

## 2017-12-11 NOTE — Telephone Encounter (Signed)
Will go ahead and send order for full face mask since patient was seen yesterday and originally wanted this to be sent to [facility].   Will close this encounter.

## 2017-12-16 ENCOUNTER — Telehealth: Payer: Self-pay | Admitting: Pulmonary Disease

## 2017-12-16 NOTE — Telephone Encounter (Signed)
PA request received from [email or web address] for Sildenafil CMM Key: [facility] PA request has been sent to plan, and a determination is expected within 3 days.   Routing to [employer] for follow-up.

## 2017-12-17 NOTE — Telephone Encounter (Signed)
PA was approved for Sildenafil through 08/03/2018. Reference number [id number] [facility] [address] was notified and so was patient.   Nothing further needed

## 2017-12-23 ENCOUNTER — Ambulatory Visit (HOSPITAL_COMMUNITY): Payer: Medicare Other

## 2017-12-23 ENCOUNTER — Telehealth: Payer: Self-pay | Admitting: Pulmonary Disease

## 2017-12-23 NOTE — Telephone Encounter (Signed)
Called and spoke to Rogers with with [facility]. Elva stated she was calling for update on appeal for Letairis 26m. It appears that Rx for Letairis 536mwas sent to [facility] on 12/11/17 under Dr. McLake BellsUpon research within pt's chart, it appears that Dr. McAundra Dubinlso prescribed this medication on 12/04/17. Elva stated she would reach out to Dr. McClaris Gladdenffice regarding this matter.   Routing to BQ [place]

## 2017-12-23 NOTE — Telephone Encounter (Signed)
We'll prescribe Please let them know it can come from [place]

## 2017-12-25 ENCOUNTER — Encounter: Payer: Self-pay | Admitting: Family Medicine

## 2017-12-25 ENCOUNTER — Ambulatory Visit (INDEPENDENT_AMBULATORY_CARE_PROVIDER_SITE_OTHER): Payer: Medicare Other | Admitting: Family Medicine

## 2017-12-25 VITALS — BP 102/72 | HR 61 | Resp 20 | Ht 63.0 in | Wt 169.4 lb

## 2017-12-25 DIAGNOSIS — R739 Hyperglycemia, unspecified: Secondary | ICD-10-CM | POA: Diagnosis not present

## 2017-12-25 DIAGNOSIS — J439 Emphysema, unspecified: Secondary | ICD-10-CM

## 2017-12-25 DIAGNOSIS — I10 Essential (primary) hypertension: Secondary | ICD-10-CM | POA: Diagnosis not present

## 2017-12-25 DIAGNOSIS — F329 Major depressive disorder, single episode, unspecified: Secondary | ICD-10-CM | POA: Diagnosis not present

## 2017-12-25 DIAGNOSIS — R040 Epistaxis: Secondary | ICD-10-CM

## 2017-12-25 MED ORDER — MUPIROCIN 2 % EX OINT: 1.0000 "application " | TOPICAL_OINTMENT | Freq: Two times a day (BID) | CUTANEOUS | 1 refills | Status: DC

## 2017-12-25 NOTE — Patient Instructions (Addendum)
Revatio/Sildenafil 20 mg  CoverMyMeds or GoodRx  [facility] Cholesterol Cholesterol is a white, waxy, fat-like substance that is needed by the human body in small amounts. The liver makes all the cholesterol we need. Cholesterol is carried from the liver by the blood through the blood vessels. Deposits of cholesterol (plaques) may build up on blood vessel (artery) walls. Plaques make the arteries narrower and stiffer. Cholesterol plaques increase the risk for heart attack and stroke. You cannot feel your cholesterol level even if it is very high. The only way to know that it is high is to have a blood test. Once you know your cholesterol levels, you should keep a record of the test results. Work with your health care provider to keep your levels in the desired range. What do the results mean?  Total cholesterol is a rough measure of all the cholesterol in your blood.  LDL (low-density lipoprotein) is the "bad" cholesterol. This is the type that causes plaque to build up on the artery walls. You want this level to be low.  HDL (high-density lipoprotein) is the "good" cholesterol because it cleans the arteries and carries the LDL away. You want this level to be high.  Triglycerides are fat that the body can either burn for energy or store. High levels are closely linked to heart disease. What are the desired levels of cholesterol?  Total cholesterol below 200.  LDL below 100 for people who are at risk, below 70 for people at very high risk.  HDL above 40 is good. A level of 60 or higher is considered to be protective against heart disease.  Triglycerides below 150. How can I lower my cholesterol? Diet Follow your diet program as told by your health care provider.  Choose fish or white meat chicken and [place], roasted or baked. Limit fatty cuts of red meat, fried foods, and processed meats, such as sausage and lunch meats.  Eat lots of fresh fruits and vegetables.  Choose whole grains,  beans, pasta, potatoes, and cereals.  Choose olive oil, corn oil, or canola oil, and use only small amounts.  Avoid butter, mayonnaise, shortening, or palm kernel oils.  Avoid foods with trans fats.  Drink skim or nonfat milk and eat low-fat or nonfat yogurt and cheeses. Avoid whole milk, cream, ice cream, egg yolks, and full-fat cheeses.  Healthier desserts include angel food cake, ginger snaps, animal crackers, hard candy, popsicles, and low-fat or nonfat frozen yogurt. Avoid pastries, cakes, pies, and cookies.  Exercise  Follow your exercise program as told by your health care provider. A regular program: ? Helps to decrease LDL and raise HDL. ? Helps with weight control.  Do things that increase your activity level, such as gardening, walking, and taking the stairs.  Ask your health care provider about ways that you can be more active in your daily life.  Medicine  Take over-the-counter and prescription medicines only as told by your health care provider. ? Medicine may be prescribed by your health care provider to help lower cholesterol and decrease the risk for heart disease. This is usually done if diet and exercise have failed to bring down cholesterol levels. ? If you have several risk factors, you may need medicine even if your levels are normal.  This information is not intended to replace advice given to you by your health care provider. Make sure you discuss any questions you have with your health care provider. Document Released: 04/15/2001 Document Revised: 02/16/2016 Document Reviewed: 01/19/2016  Chartered certified accountant Patient Education  [employer].

## 2017-12-28 DIAGNOSIS — R040 Epistaxis: Secondary | ICD-10-CM | POA: Insufficient documentation

## 2017-12-28 NOTE — Assessment & Plan Note (Signed)
Feels she is managing well all things considered no changes

## 2017-12-28 NOTE — Progress Notes (Signed)
Patient ID: Kimberly Irwin, female   DOB: 1945/09/03, 72 y.o.   MRN: [id number]   Subjective:    Patient ID: Kimberly Irwin, female    DOB: [birth year], 72 y.o.   MRN: [id number]  Chief Complaint  Patient presents with  . Shortness of Breath    2 month follow up  . Epistaxis    bleeding more recently, possible from oxygen or sinus, facial soreness    HPI Patient is in today for follow up on hypertension, COPD, hyperlipidemia, hyperglycemia, depression and more. She is having trouble with epistaxis recently. She is able to stop the bleeding but it continues to recur. Can occur bilaterally. Has been to emergency room in the past month with acute worsening of symptoms and shortness of breath. Has been following with pulmonoloyg but has not started the Sildenafil yet due to cost. No recent fevers or chills is frustrated with current state of health but is managing with her current meds. Notes anhedonia but does not endorse suicidal ideation. Denies CP/palp/HA/congestion/fevers/GI or GU c/o. Taking meds as prescribed  Past Medical History:  Diagnosis Date  . Acute hip pain 10/26/2012  . Bronchitis 09/04/2016  . Bursitis of left hip 10/26/2012  . Cardiomegaly 07/13/2016  . Chronic low back pain   . Coronary artery calcification 07/03/2016  . DDD (degenerative disc disease)    L3-4 with facet arthropathy and stenosis  . Degenerative spondylolisthesis    L4-5 grade 1 with stenosis  . GERD (gastroesophageal reflux disease)   . Heart murmur    as an infant  . History of hiatal hernia   . Hyperlipidemia   . Hypertension   . Hypothyroidism   . Hypothyroidism 10/28/2015  . Hypoxia, sleep related 10/21/2016  . Pain in joint, lower leg 10/26/2012   left   . Pneumonia    as a child several times.   . Sarcoidosis   . SOB (shortness of breath) 04/03/2016  . Tremors of nervous system     Past Surgical History:  Procedure Laterality Date  . ANTERIOR LAT LUMBAR FUSION Left 08/29/2014   Procedure:  EXTREME LEFT LATERAL INTERBODY FUSION LUMBAR TWO-THREE LATERAL PLATE;  Surgeon: Charlie Pitter, MD;  Location: [facility];  Service: Neurosurgery;  Laterality: Left;  . BACK SURGERY  01/15/09   L3-4 and L4-5 decompressive laminectomy with bilateral L3, L4, and L5 decompressive foraminotomies, more than it would be required for simple interbody fusion alone.  [facility] BACK SURGERY  01/15/09   L3-4 and L4-5 posterior lumbar interbody fusion utilizing tanget interbody allograft wedge, Telamon interbody PEEk cage, and local autografting.  [facility] Kitchen BACK SURGERY  01/15/09   L3, L4, and L5 posterolateral arthrodesis using segmental pedicle screw fixation and local autografting.  [facility] CARDIAC CATHETERIZATION    . CARPAL TUNNEL RELEASE Bilateral   . COLONOSCOPY    . ESOPHAGOGASTRODUODENOSCOPY    . EYE SURGERY     lens implant  . JOINT REPLACEMENT  09/13/09   Right Hip, Dr. Alvan Dame  . KYPHOPLASTY N/A 09/14/2014   Procedure: Lumbar Two Kyphoplasty/Vertebroplasty;  Surgeon: Charlie Pitter, MD;  Location: [facility];  Service: Neurosurgery;  Laterality: N/A;  Lumbar Two Kyphoplasty/Vertebroplasty  . RIGHT HEART CATH N/A 11/19/2017   Procedure: RIGHT HEART CATH;  Surgeon: Larey Dresser, MD;  Location: [facility];  Service: Cardiovascular;  Laterality: N/A;  . THYROIDECTOMY    . TOTAL HIP ARTHROPLASTY Left 06/28/2013   Procedure: LEFT TOTAL  HIP ARTHROPLASTY ANTERIOR APPROACH;  Surgeon: Mauri Pole, MD;  Location: [facility];  Service: Orthopedics;  Laterality: Left;    Family History  Problem Relation Age of Onset  . Cancer Mother 22       Breast  . Heart disease Mother   . Hypertension Mother   . Hyperlipidemia Mother   . Heart attack Mother   . Asthma Maternal Grandmother     Social History   Socioeconomic History  . Marital status: Widowed    Spouse name: Not on file  . Number of children: 2  . Years of education: Not on file  . Highest education level: Not on file  Occupational History  . Not on  file  Social Needs  . Financial resource strain: Not on file  . Food insecurity:    Worry: Not on file    Inability: Not on file  . Transportation needs:    Medical: Not on file    Non-medical: Not on file  Tobacco Use  . Smoking status: Former Smoker    Packs/day: 0.50    Years: 32.00    Pack years: 16.00    Types: Cigarettes    Last attempt to quit: 07/22/1994    Years since quitting: 23.4  . Smokeless tobacco: Never Used  Substance and Sexual Activity  . Alcohol use: No    Alcohol/week: 0.0 oz  . Drug use: No  . Sexual activity: Never  Lifestyle  . Physical activity:    Days per week: Not on file    Minutes per session: Not on file  . Stress: Not on file  Relationships  . Social connections:    Talks on phone: Not on file    Gets together: Not on file    Attends religious service: Not on file    Active member of club or organization: Not on file    Attends meetings of clubs or organizations: Not on file    Relationship status: Not on file  . Intimate partner violence:    Fear of current or ex partner: Not on file    Emotionally abused: Not on file    Physically abused: Not on file    Forced sexual activity: Not on file  Other Topics Concern  . Not on file  Social History Narrative   Married 5 years and has 2 children (2 daughters)   Alcohol Use - no   Former Smoker - she quit 10 years ago, started when she was 34 and has had varying level of use of tobacco products from 1/2 pack to 3 packs per day.    Outpatient Medications Prior to Visit  Medication Sig Dispense Refill  . albuterol (PROVENTIL HFA;VENTOLIN HFA) 108 (90 Base) MCG/ACT inhaler Inhale 2 puffs into the lungs every 6 (six) hours as needed for wheezing or shortness of breath. 1 Inhaler 6  . albuterol (PROVENTIL) (2.5 MG/3ML) 0.083% nebulizer solution Take 3 mLs (2.5 mg total) by nebulization every 6 (six) hours as needed for wheezing or shortness of breath. 30 mL 4  . ambrisentan (LETAIRIS) 5 MG  tablet Take 1 tablet (5 mg total) by mouth daily. 30 tablet 11  . aspirin EC 81 MG tablet Take 81 mg by mouth at bedtime.     [facility] atenolol (TENORMIN) 25 MG tablet Take 1 tablet (25 mg total) by mouth at bedtime. Take the 50 mg Atenolol in am and the 25 mg in pm 90 tablet 3  . atenolol (TENORMIN) 50 MG tablet TAKE 1 TABLET (50  MG TOTAL) BY MOUTH DAILY. (Patient taking differently: Take 50 mg by mouth daily. ) 90 tablet 1  . BESIVANCE 0.6 % SUSP Place 1 drop into the left eye See admin instructions. INSTILL 1 DROP INTO LEFT 4 TIMES DAILY x 2 DAYS AFTER EYE INJECTION  12  . budesonide-formoterol (SYMBICORT) 160-4.5 MCG/ACT inhaler Inhale 2 puffs into the lungs 2 (two) times daily. 1 Inhaler 0  . Calcium Carbonate-Vit D-Min (CALCIUM 1200 PO) Take 1 tablet by mouth daily.     . Cholecalciferol (VITAMIN D) 2000 UNITS tablet Take 2,000 Units by mouth at bedtime.     . Cinnamon 500 MG capsule Take 2,000 mg by mouth daily.      [facility] CINNAMON PO Take 2 capsules by mouth daily. 1000 MG PER CAPSULE    . citalopram (CELEXA) 10 MG tablet Take 1 tablet (10 mg total) by mouth at bedtime. 30 tablet 1  . Coenzyme Q10 200 MG capsule Take 200 mg by mouth at bedtime.     . fexofenadine-pseudoephedrine (ALLEGRA-D 24) 180-240 MG 24 hr tablet Take 1 tablet by mouth daily.    [facility] Kitchen gabapentin (NEURONTIN) 300 MG capsule Take 1 capsule (300 mg total) by mouth at bedtime. 90 capsule 2  . Glucosamine HCl (GLUCOSAMINE PO) Take 1 tablet by mouth at bedtime.     . hydrochlorothiazide (HYDRODIURIL) 25 MG tablet TAKE 1 TABLET (25 MG TOTAL) BY MOUTH DAILY. 90 tablet 1  . ketoconazole (NIZORAL) 2 % shampoo Apply 1 application topically 2 (two) times a week. (Patient taking differently: Apply 1 application topically every 14 (fourteen) days. ONCE EVERY 2 WEEKS PER PATIENT) 120 mL 03  . levothyroxine (SYNTHROID, LEVOTHROID) 112 MCG tablet TAKE 1 TABLET (112 MCG TOTAL) BY MOUTH DAILY BEFORE BREAKFAST. 90 tablet 1  . mometasone (NASONEX) 50  MCG/ACT nasal spray Place 2 sprays daily as needed into the nose. (Patient taking differently: Place 2 sprays into the nose daily as needed (FOR ALLERGIES.). ) 17 g 12  . mupirocin ointment (BACTROBAN) 2 % Apply twice daily to bridge of the nose. (Patient taking differently: Apply 1 application topically 2 (two) times daily as needed (for redness on the bridge of nose). ) 22 g 0  . Omega-3 Fatty Acids (FISH OIL) 1200 MG CAPS Take 2,400 mg by mouth daily.     Marland Kitchen omeprazole (PRILOSEC) 40 MG capsule TAKE 1 CAPSULE (40 MG TOTAL) BY MOUTH DAILY. 90 capsule 1  . potassium chloride (KLOR-CON 10) 10 MEQ tablet Take 1 tablet (10 mEq total) by mouth daily. 30 tablet 3  . rosuvastatin (CRESTOR) 20 MG tablet TAKE 1 TABLET (20 MG TOTAL) BY MOUTH DAILY. (Patient taking differently: Take 20 mg by mouth at bedtime. ) 90 tablet 2  . sildenafil (REVATIO) 20 MG tablet Take 1 tablet (20 mg total) by mouth 3 (three) times daily. 90 tablet 5  . traMADol (ULTRAM) 50 MG tablet Take 2 tablets (100 mg total) by mouth every 6 (six) hours as needed for moderate pain or severe pain. 120 tablet 0  . vitamin B-12 (CYANOCOBALAMIN) 1000 MCG tablet Take 1,000 mcg by mouth daily.     No facility-administered medications prior to visit.     Allergies  Allergen Reactions  . Lipitor [Atorvastatin] Swelling  . Penicillins Swelling    Has patient had a PCN reaction causing immediate rash, facial/tongue/throat swelling, SOB or lightheadedness with hypotension: Yes Has patient had a PCN reaction causing severe rash involving mucus membranes or skin necrosis: No Has patient  had a PCN reaction that required hospitalization: No Has patient had a PCN reaction occurring within the last 10 years: No If all of the above answers are "NO", then may proceed with Cephalosporin use.   . Metronidazole Swelling  . Pregabalin Other (See Comments)    REACTION: Somnolence and dizziness  . Simvastatin Other (See Comments)    Leg pain    Review of  Systems  Constitutional: Positive for malaise/fatigue. Negative for fever.  HENT: Positive for nosebleeds. Negative for congestion.   Eyes: Negative for blurred vision.  Respiratory: Positive for shortness of breath.   Cardiovascular: Negative for chest pain, palpitations and leg swelling.  Gastrointestinal: Negative for abdominal pain, blood in stool and nausea.  Genitourinary: Negative for dysuria and frequency.  Musculoskeletal: Negative for falls.  Skin: Negative for rash.  Neurological: Negative for dizziness, loss of consciousness and headaches.  Endo/Heme/Allergies: Negative for environmental allergies.  Psychiatric/Behavioral: Positive for depression. The patient is not nervous/anxious.        Objective:    Physical Exam  Constitutional: She is oriented to person, place, and time. She appears well-developed and well-nourished. No distress.  HENT:  Head: Normocephalic and atraumatic.  Nose: Nose normal.  Eyes: Right eye exhibits no discharge. Left eye exhibits no discharge.  Neck: Normal range of motion. Neck supple.  Cardiovascular: Normal rate and regular rhythm.  No murmur heard. Pulmonary/Chest: Effort normal and breath sounds normal.  Diminished BS b/l bases. O2 via [place]  Abdominal: Soft. Bowel sounds are normal. There is no tenderness.  Musculoskeletal: She exhibits no edema.  Neurological: She is alert and oriented to person, place, and time.  Skin: Skin is warm and dry.  Psychiatric: She has a normal mood and affect.  Nursing note and vitals reviewed.   BP 102/72 (BP Location: Left Arm, Patient Position: Sitting, Cuff Size: Normal)   Pulse 61   Resp 20   Ht _0  (1.6 m)   Wt 169 lb 6.4 oz (76.8 kg)   SpO2 93%   BMI 30.01 kg/m  Wt Readings from Last 3 Encounters:  12/25/17 169 lb 6.4 oz (76.8 kg)  12/10/17 176 lb (79.8 kg)  12/04/17 176 lb (79.8 kg)     Lab Results  Component Value Date   WBC 10.2 12/04/2017   HGB 15.2 (H) 12/04/2017   HCT 45.6  12/04/2017   PLT 195 12/04/2017   GLUCOSE 113 (H) 11/19/2017   CHOL 135 10/19/2017   TRIG 200.0 (H) 10/19/2017   HDL 42.30 10/19/2017   LDLDIRECT 84.0 03/03/2017   LDLCALC 53 10/19/2017   ALT 16 10/19/2017   AST 20 10/19/2017   NA 141 11/19/2017   K 4.8 11/19/2017   CL 105 11/19/2017   CREATININE 1.06 (H) 11/19/2017   BUN 12 11/19/2017   CO2 26 11/19/2017   TSH 0.11 (L) 10/19/2017   INR 1.11 11/19/2017   HGBA1C 5.5 06/19/2017   MICROALBUR 0.2 04/27/2014    Lab Results  Component Value Date   TSH 0.11 (L) 10/19/2017   Lab Results  Component Value Date   WBC 10.2 12/04/2017   HGB 15.2 (H) 12/04/2017   HCT 45.6 12/04/2017   MCV 91.0 12/04/2017   PLT 195 12/04/2017   Lab Results  Component Value Date   NA 141 11/19/2017   K 4.8 11/19/2017   CO2 26 11/19/2017   GLUCOSE 113 (H) 11/19/2017   BUN 12 11/19/2017   CREATININE 1.06 (H) 11/19/2017   BILITOT 0.8 10/19/2017  ALKPHOS 45 10/19/2017   AST 20 10/19/2017   ALT 16 10/19/2017   PROT 7.3 10/19/2017   ALBUMIN 4.2 10/19/2017   CALCIUM 9.9 11/19/2017   ANIONGAP 10 11/19/2017   GFR 54.52 (L) 10/19/2017   Lab Results  Component Value Date   CHOL 135 10/19/2017   Lab Results  Component Value Date   HDL 42.30 10/19/2017   Lab Results  Component Value Date   LDLCALC 53 10/19/2017   Lab Results  Component Value Date   TRIG 200.0 (H) 10/19/2017   Lab Results  Component Value Date   CHOLHDL 3 10/19/2017   Lab Results  Component Value Date   HGBA1C 5.5 06/19/2017       Assessment & Plan:   Problem List Items Addressed This Visit    Essential hypertension (Chronic)    Well controlled, no changes to meds. Encouraged heart healthy diet such as the DASH diet and exercise as tolerated.       Hyperglycemia    hgba1c acceptable, minimize simple carbs. Increase exercise as tolerated.       Depression (Chronic)    Feels she is managing well all things considered no changes      COPD (chronic  obstructive pulmonary disease) (HCC)    Is following closely with pulmonology secondary to the severity of her symptoms but was unable to pick up her Sildenafil due to cost is advised to check with [facility], CoverMyMeds and GoodRx to see if she can find a cost effective source she agrees to try      Epistaxis - Primary    Given rx for Mupirocin ointment to apply small amount to b/l nares qhs if symptoms worsen will need referral to ENT for definitive treatment         I am having Kimberly Irwin start on mupirocin ointment. I am also having her maintain her Cinnamon, Fish Oil, Calcium Carbonate-Vit D-Min (CALCIUM 1200 PO), Vitamin D, Coenzyme Q10, aspirin EC, traMADol, BESIVANCE, mupirocin ointment, Glucosamine HCl (GLUCOSAMINE PO), fexofenadine-pseudoephedrine, vitamin B-12, albuterol, gabapentin, albuterol, mometasone, rosuvastatin, potassium chloride, hydrochlorothiazide, levothyroxine, ketoconazole, atenolol, atenolol, budesonide-formoterol, CINNAMON PO, omeprazole, citalopram, sildenafil, and ambrisentan.  Meds ordered this encounter  Medications  . mupirocin ointment (BACTROBAN) 2 %    Sig: Place 1 application into the nose 2 (two) times daily.    Dispense:  22 g    Refill:  1     Penni Homans, MD

## 2017-12-28 NOTE — Assessment & Plan Note (Signed)
hgba1c acceptable, minimize simple carbs. Increase exercise as tolerated.  

## 2017-12-28 NOTE — Assessment & Plan Note (Signed)
Well controlled, no changes to meds. Encouraged heart healthy diet such as the DASH diet and exercise as tolerated.

## 2017-12-28 NOTE — Assessment & Plan Note (Signed)
Is following closely with pulmonology secondary to the severity of her symptoms but was unable to pick up her Sildenafil due to cost is advised to check with [facility], CoverMyMeds and GoodRx to see if she can find a cost effective source she agrees to try

## 2017-12-28 NOTE — Assessment & Plan Note (Signed)
Given rx for Mupirocin ointment to apply small amount to b/l nares qhs if symptoms worsen will need referral to ENT for definitive treatment

## 2017-12-31 ENCOUNTER — Telehealth: Payer: Self-pay | Admitting: Pulmonary Disease

## 2017-12-31 NOTE — Telephone Encounter (Signed)
She cannot take both.  If she has been approved for Adcirca, would probably take that since it is once a day.

## 2017-12-31 NOTE — Telephone Encounter (Signed)
Agree with Dr. Aundra Dubin Call her and ask her what she has at home and what has been approved

## 2017-12-31 NOTE — Telephone Encounter (Signed)
Called and spoke with pt who stated shipment of sildenafil is going to be delivered tomorrow, 01/01/18.  Pt states she was told that she could not take the sildenafil that was prescribed by [facility] along with the adcirca that has been prescribed by Dr. Aundra Dubin.  Stated to pt I would make [facility] know about this and also stated to her to call Dr. Claris Gladden office to make them aware as well.  Pt expressed understanding. Routing message to Dr. Lake Bells and also going to route message to Dr. Aundra Dubin

## 2018-01-01 ENCOUNTER — Encounter (HOSPITAL_COMMUNITY): Payer: Self-pay

## 2018-01-01 NOTE — Telephone Encounter (Signed)
Spoke with patient. She stated that she has both medications at home. Patient stated that she will only take Adcirca and will discontinue the sildenafil today.   Nothing further needed at time of call.

## 2018-01-02 ENCOUNTER — Other Ambulatory Visit: Payer: Self-pay | Admitting: Family Medicine

## 2018-01-05 ENCOUNTER — Encounter (INDEPENDENT_AMBULATORY_CARE_PROVIDER_SITE_OTHER): Payer: Medicare Other | Admitting: Ophthalmology

## 2018-01-05 DIAGNOSIS — I10 Essential (primary) hypertension: Secondary | ICD-10-CM

## 2018-01-05 DIAGNOSIS — H35033 Hypertensive retinopathy, bilateral: Secondary | ICD-10-CM | POA: Diagnosis not present

## 2018-01-05 DIAGNOSIS — H43813 Vitreous degeneration, bilateral: Secondary | ICD-10-CM

## 2018-01-05 DIAGNOSIS — H34832 Tributary (branch) retinal vein occlusion, left eye, with macular edema: Secondary | ICD-10-CM

## 2018-01-11 ENCOUNTER — Ambulatory Visit (HOSPITAL_BASED_OUTPATIENT_CLINIC_OR_DEPARTMENT_OTHER)
Admission: RE | Admit: 2018-01-11 | Discharge: 2018-01-11 | Disposition: A | Discharge status: Home / Self Care | Payer: Medicare Other | Source: Ambulatory Visit | Attending: Cardiology | Admitting: Cardiology

## 2018-01-11 ENCOUNTER — Ambulatory Visit (HOSPITAL_COMMUNITY)
Admission: RE | Admit: 2018-01-11 | Discharge: 2018-01-11 | Disposition: A | Discharge status: Home / Self Care | Payer: Medicare Other | Source: Ambulatory Visit | Attending: Cardiology | Admitting: Cardiology

## 2018-01-11 ENCOUNTER — Other Ambulatory Visit: Payer: Self-pay

## 2018-01-11 VITALS — BP 106/70 | HR 48 | Wt 169.4 lb

## 2018-01-11 DIAGNOSIS — Z79899 Other long term (current) drug therapy: Secondary | ICD-10-CM | POA: Diagnosis not present

## 2018-01-11 DIAGNOSIS — J439 Emphysema, unspecified: Secondary | ICD-10-CM | POA: Insufficient documentation

## 2018-01-11 DIAGNOSIS — E039 Hypothyroidism, unspecified: Secondary | ICD-10-CM | POA: Insufficient documentation

## 2018-01-11 DIAGNOSIS — D649 Anemia, unspecified: Secondary | ICD-10-CM | POA: Diagnosis not present

## 2018-01-11 DIAGNOSIS — Z7982 Long term (current) use of aspirin: Secondary | ICD-10-CM | POA: Diagnosis not present

## 2018-01-11 DIAGNOSIS — J9611 Chronic respiratory failure with hypoxia: Secondary | ICD-10-CM | POA: Diagnosis not present

## 2018-01-11 DIAGNOSIS — Z79890 Hormone replacement therapy (postmenopausal): Secondary | ICD-10-CM | POA: Diagnosis not present

## 2018-01-11 DIAGNOSIS — E785 Hyperlipidemia, unspecified: Secondary | ICD-10-CM | POA: Diagnosis not present

## 2018-01-11 DIAGNOSIS — I272 Pulmonary hypertension, unspecified: Secondary | ICD-10-CM | POA: Diagnosis not present

## 2018-01-11 DIAGNOSIS — Z7951 Long term (current) use of inhaled steroids: Secondary | ICD-10-CM | POA: Diagnosis not present

## 2018-01-11 DIAGNOSIS — I10 Essential (primary) hypertension: Secondary | ICD-10-CM | POA: Diagnosis not present

## 2018-01-11 DIAGNOSIS — D869 Sarcoidosis, unspecified: Secondary | ICD-10-CM | POA: Diagnosis not present

## 2018-01-11 DIAGNOSIS — Z87891 Personal history of nicotine dependence: Secondary | ICD-10-CM | POA: Diagnosis not present

## 2018-01-11 DIAGNOSIS — I2510 Atherosclerotic heart disease of native coronary artery without angina pectoris: Secondary | ICD-10-CM | POA: Diagnosis not present

## 2018-01-11 DIAGNOSIS — Z9981 Dependence on supplemental oxygen: Secondary | ICD-10-CM | POA: Insufficient documentation

## 2018-01-11 MED ORDER — TADALAFIL (PAH) 20 MG PO TABS: 20.0000 mg | ORAL_TABLET | Freq: Every day | ORAL | Status: DC

## 2018-01-11 MED ORDER — SILDENAFIL CITRATE 20 MG PO TABS: 20.0000 mg | ORAL_TABLET | Freq: Three times a day (TID) | ORAL | 5 refills | Status: DC

## 2018-01-11 NOTE — Progress Notes (Signed)
  Echocardiogram 2D Echocardiogram has been performed.  Kimberly Irwin 01/11/2018, 11:53 AM

## 2018-01-11 NOTE — Patient Instructions (Addendum)
Continue/Start taking Adcirca 20 mg daily  Stop taking Sildenafil  Start Letairis 5 mg daily  Stop HCTZ (hydorchlorithazide)   Stop Potassium  Please follow up with Doroteo Bradford, our heart failure pharmacist in 2 weeks to increase your medications, PLEASE BRING ALL YOU BOTTLES TO THIS APPOINTMENT  Your physician recommends that you schedule a follow-up appointment in: 6 weeks with Dr Aundra Dubin

## 2018-01-12 NOTE — Progress Notes (Signed)
PCP: Dr. Charlett Blake Pulmonary: Dr. Lake Bells Cardiology: Dr. Aundra Dubin  72 yo with history of sarcoidosis and emphysema was referred by Dr Lake Bells for evaluation of pulmonary hypertension.  She has a diagnosis of sarcoidosis, but CT chest in 3/19 did not show marked parenchymal disease.  PFTs showed very low DLCO.  She had RHC done in 4/19 with moderate pulmonary hypertension and very high PVR.    She is short of breath walking short distances.  This has been slowly progressive.  She is now wearing 2 L [place] at all times.  She does not smoke, says she quit > 20 years ago.    She is now on tadalafil 20 mg daily.  There has been confusion getting her on dual therapy with an ERA; it appears that macitentan was too expensive and ambrisentan has not been ordered yet.   She does not feel like Adcirca has helped much so far, she has noted some palpitations when taking Adcirca but no improvement in her breathing.  She is still short of breath walking around the house. No chest pain, lightheadedness/syncope, orthopnea/PND.  Echo today was reviewed: EF 55-60%, mildly dilated RV with moderately decreased systolic function, PASP 83 mmHg, aortic sclerosis without significant stenosis.   6 minute walk (5/19): 122 m  Labs (3/19): LDL 53 Labs (4/19): hgb 8.5, K 4.8, creatinine 1.06 Labs (5/19): anti-SCL70 negative, ANA negative, RF negative, ANCA negative, HIV negative, anti-centromere negative.   PMH: 1. Sarcoidosis: On home oxygen.  - PFTs (3/19): FEV1 84%, FVC 84%, ratio 71%, TLC 75%, DLCO 25% - CT (3/19) with mild fibrotic changes in the lung bases.  2. Emphysema: Moderate emphysema on CT chest 3/19.  3. CAD: Calcification of coronaries on CT chest in 3/19.  - Cardiolite in 1/19: EF 65%, no ischemia/infarction.  4. HTN 5. Hyperlipidemia 6. Hypothyroidism 7. Anemia 8. Pulmonary hypertension:  - Echo (9/17): EF 60-65%, PASP 44 mmHg.  - RHC (4/19): mean RA 2, PA 66/16 mean 34, mean PCWP 3, PVR 8.3 WU, CI  2.04 - Autoimmune serologies negative.  - V/Q scan did not show evidence for chronic PE.  - Echo (6/19): EF 55-60%, mildly dilated RV with moderately decreased systolic function, PASP 83 mmHg, aortic sclerosis without significant stenosis.   Social History   Socioeconomic History  . Marital status: Widowed    Spouse name: Not on file  . Number of children: 2  . Years of education: Not on file  . Highest education level: Not on file  Occupational History  . Not on file  Social Needs  . Financial resource strain: Not on file  . Food insecurity:    Worry: Not on file    Inability: Not on file  . Transportation needs:    Medical: Not on file    Non-medical: Not on file  Tobacco Use  . Smoking status: Former Smoker    Packs/day: 0.50    Years: 32.00    Pack years: 16.00    Types: Cigarettes    Last attempt to quit: 07/22/1994    Years since quitting: 23.4  . Smokeless tobacco: Never Used  Substance and Sexual Activity  . Alcohol use: No    Alcohol/week: 0.0 oz  . Drug use: No  . Sexual activity: Never  Lifestyle  . Physical activity:    Days per week: Not on file    Minutes per session: Not on file  . Stress: Not on file  Relationships  . Social connections:  Talks on phone: Not on file    Gets together: Not on file    Attends religious service: Not on file    Active member of club or organization: Not on file    Attends meetings of clubs or organizations: Not on file    Relationship status: Not on file  . Intimate partner violence:    Fear of current or ex partner: Not on file    Emotionally abused: Not on file    Physically abused: Not on file    Forced sexual activity: Not on file  Other Topics Concern  . Not on file  Social History Narrative   Married 20 years and has 2 children (2 daughters)   Alcohol Use - no   Former Smoker - she quit 10 years ago, started when she was 48 and has had varying level of use of tobacco products from 1/2 pack to 3 packs per  day.   Family History  Problem Relation Age of Onset  . Cancer Mother 22       Breast  . Heart disease Mother   . Hypertension Mother   . Hyperlipidemia Mother   . Heart attack Mother   . Asthma Maternal Grandmother    ROS: All systems reviewed and negative except as per HPI.   Current Outpatient Medications  Medication Sig Dispense Refill  . albuterol (PROVENTIL HFA;VENTOLIN HFA) 108 (90 Base) MCG/ACT inhaler Inhale 2 puffs into the lungs every 6 (six) hours as needed for wheezing or shortness of breath. 1 Inhaler 6  . albuterol (PROVENTIL) (2.5 MG/3ML) 0.083% nebulizer solution Take 3 mLs (2.5 mg total) by nebulization every 6 (six) hours as needed for wheezing or shortness of breath. 30 mL 4  . ambrisentan (LETAIRIS) 5 MG tablet Take 1 tablet (5 mg total) by mouth daily. 30 tablet 11  . aspirin EC 81 MG tablet Take 81 mg by mouth at bedtime.     Marland Kitchen atenolol (TENORMIN) 50 MG tablet TAKE 1 TABLET (50 MG TOTAL) BY MOUTH DAILY. (Patient taking differently: Take 50 mg by mouth daily. ) 90 tablet 1  . BESIVANCE 0.6 % SUSP Place 1 drop into the left eye See admin instructions. INSTILL 1 DROP INTO LEFT 4 TIMES DAILY x 2 DAYS AFTER EYE INJECTION  12  . budesonide-formoterol (SYMBICORT) 160-4.5 MCG/ACT inhaler Inhale 2 puffs into the lungs 2 (two) times daily. 1 Inhaler 0  . Calcium Carbonate-Vit D-Min (CALCIUM 1200 PO) Take 1 tablet by mouth daily.     . Cholecalciferol (VITAMIN D) 2000 UNITS tablet Take 2,000 Units by mouth at bedtime.     . Cinnamon 500 MG capsule Take 2,000 mg by mouth daily.      Marland Kitchen CINNAMON PO Take 2 capsules by mouth daily. 1000 MG PER CAPSULE    . citalopram (CELEXA) 10 MG tablet Take 1 tablet (10 mg total) by mouth at bedtime. 30 tablet 1  . Coenzyme Q10 200 MG capsule Take 200 mg by mouth at bedtime.     . fexofenadine-pseudoephedrine (ALLEGRA-D 24) 180-240 MG 24 hr tablet Take 1 tablet by mouth daily.    Marland Kitchen gabapentin (NEURONTIN) 300 MG capsule TAKE 1 CAPSULE (300 MG  TOTAL) BY MOUTH AT BEDTIME. 90 capsule 2  . Glucosamine HCl (GLUCOSAMINE PO) Take 1 tablet by mouth at bedtime.     Marland Kitchen ketoconazole (NIZORAL) 2 % shampoo Apply 1 application topically 2 (two) times a week. (Patient taking differently: Apply 1 application topically every 14 (fourteen) days.  ONCE EVERY 2 WEEKS PER PATIENT) 120 mL 03  . levothyroxine (SYNTHROID, LEVOTHROID) 112 MCG tablet TAKE 1 TABLET (112 MCG TOTAL) BY MOUTH DAILY BEFORE BREAKFAST. 90 tablet 1  . mometasone (NASONEX) 50 MCG/ACT nasal spray Place 2 sprays daily as needed into the nose. (Patient taking differently: Place 2 sprays into the nose daily as needed (FOR ALLERGIES.). ) 17 g 12  . mupirocin ointment (BACTROBAN) 2 % Apply twice daily to bridge of the nose. (Patient taking differently: Apply 1 application topically 2 (two) times daily as needed (for redness on the bridge of nose). ) 22 g 0  . mupirocin ointment (BACTROBAN) 2 % Place 1 application into the nose 2 (two) times daily. 22 g 1  . Omega-3 Fatty Acids (FISH OIL) 1200 MG CAPS Take 2,400 mg by mouth daily.     Marland Kitchen omeprazole (PRILOSEC) 40 MG capsule TAKE 1 CAPSULE (40 MG TOTAL) BY MOUTH DAILY. 90 capsule 1  . rosuvastatin (CRESTOR) 20 MG tablet TAKE 1 TABLET (20 MG TOTAL) BY MOUTH DAILY. (Patient taking differently: Take 20 mg by mouth at bedtime. ) 90 tablet 2  . traMADol (ULTRAM) 50 MG tablet Take 2 tablets (100 mg total) by mouth every 6 (six) hours as needed for moderate pain or severe pain. 120 tablet 0  . vitamin B-12 (CYANOCOBALAMIN) 1000 MCG tablet Take 1,000 mcg by mouth daily.    . tadalafil, PAH, (ADCIRCA) 20 MG tablet Take 1 tablet (20 mg total) by mouth daily. 60 tablet    No current facility-administered medications for this encounter.    BP 106/70   Pulse (!) 48   Wt 169 lb 6.4 oz (76.8 kg)   SpO2 94%   BMI 30.01 kg/m  General: NAD Neck: No JVD, no thyromegaly or thyroid nodule.  Lungs: Clear to auscultation bilaterally with normal respiratory  effort. CV: Nondisplaced PMI.  Heart regular S1/S2, no S3/S4, no murmur.  No peripheral edema.  No carotid bruit.  Normal pedal pulses.  Abdomen: Soft, nontender, no hepatosplenomegaly, no distention.  Skin: Intact without lesions or rashes.  Neurologic: Alert and oriented x 3. Tremor noted.  Psych: Normal affect. Extremities: No clubbing or cyanosis.  HEENT: Normal.    Assessment/Plan: 1. Pulmonary hypertension: Cath 4/19 with moderate PAH, very high PVR. Right and left heart filling pressures were low.  PFTs in 3/19 showed mild restriction with very low DLCO consistent with pulmonary vascular disease.  V/Q scan with no evidence for chronic PE and autoimmune serologies were negative. High resolution CT in 3/19 showed mild fibrotic changes at the lung bases and a degree of emphysema.  I do not think CT findings can explain her PH.  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  Echo today showed normal LV EF but mildly dilated/moderately dysfunctional RV with severe pulmonary hypertension.  - She still needs a sleep study with the pulmonary department.  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  As parenchymal disease does not appear particularly bad on CT, I think a trial of pulmonary vasodilators is reasonable.  She is on Adcirca 20 mg daily. I would like to add ambrisentan 5 mg daily, will work on this today.  She will then followup in HF pharmacy clinic in 2 weeks to titrate up [place].   2. Chronic hypoxemic respiratory failure: She is on home oxygen.  As her parenchymal disease does not appear marked, I suspect pulmonary hypertension is playing a role in her low oxygen saturation.   -  Continue home oxygen, watch for desaturation with initiation of pulmonary vasodilators.  3. HTN: BP is now on the lower side. I think she can stop HCTZ and KCl.   4. Hyperlipidemia: Good lipids 3/19.   Followup in 2 wks in HF pharmacy clinic for [facility] med titration.  See me in 6 wks.   [name] 01/12/2018

## 2018-01-19 ENCOUNTER — Telehealth (HOSPITAL_COMMUNITY): Payer: Self-pay

## 2018-01-19 ENCOUNTER — Telehealth: Payer: Self-pay | Admitting: Pulmonary Disease

## 2018-01-19 NOTE — Telephone Encounter (Signed)
Pt called b/c she now has funding for opsumit... We will move forward with opsumit instead of letarisis per Dr. Aundra Dubin . Pt aware and agreeable to med changes (changes made in [facility])

## 2018-01-19 NOTE — Telephone Encounter (Signed)
Attempted to call pt. I did not receive an answer. I have left a message for pt to return our call.  

## 2018-01-20 NOTE — Telephone Encounter (Signed)
Spoke with pt, states that she received funding for Opsumit, so per Dr. Claris Gladden office she will be proceeding with Opsumit instead of Letairis.  Forwarding to BQ to make aware.

## 2018-01-20 NOTE — Telephone Encounter (Signed)
Spoke with pt, and made her ware of BQ's response. Nothing further is needed.

## 2018-01-20 NOTE — Telephone Encounter (Signed)
OK 

## 2018-01-24 ENCOUNTER — Inpatient Hospital Stay (HOSPITAL_COMMUNITY)
Admission: EM | Admit: 2018-01-24 | Discharge: 2018-01-28 | DRG: 190 | Disposition: A | Discharge status: Home Health | Payer: Medicare Other | Attending: Internal Medicine | Admitting: Internal Medicine

## 2018-01-24 ENCOUNTER — Emergency Department (HOSPITAL_COMMUNITY): Payer: Medicare Other

## 2018-01-24 ENCOUNTER — Encounter (HOSPITAL_COMMUNITY): Payer: Self-pay | Admitting: Nurse Practitioner

## 2018-01-24 DIAGNOSIS — J9611 Chronic respiratory failure with hypoxia: Secondary | ICD-10-CM | POA: Diagnosis not present

## 2018-01-24 DIAGNOSIS — Z888 Allergy status to other drugs, medicaments and biological substances status: Secondary | ICD-10-CM

## 2018-01-24 DIAGNOSIS — Z9981 Dependence on supplemental oxygen: Secondary | ICD-10-CM

## 2018-01-24 DIAGNOSIS — E669 Obesity, unspecified: Secondary | ICD-10-CM | POA: Diagnosis present

## 2018-01-24 DIAGNOSIS — G894 Chronic pain syndrome: Secondary | ICD-10-CM | POA: Diagnosis present

## 2018-01-24 DIAGNOSIS — E785 Hyperlipidemia, unspecified: Secondary | ICD-10-CM | POA: Diagnosis present

## 2018-01-24 DIAGNOSIS — Z981 Arthrodesis status: Secondary | ICD-10-CM | POA: Diagnosis not present

## 2018-01-24 DIAGNOSIS — I10 Essential (primary) hypertension: Secondary | ICD-10-CM | POA: Diagnosis not present

## 2018-01-24 DIAGNOSIS — F329 Major depressive disorder, single episode, unspecified: Secondary | ICD-10-CM | POA: Diagnosis present

## 2018-01-24 DIAGNOSIS — R0602 Shortness of breath: Secondary | ICD-10-CM | POA: Diagnosis not present

## 2018-01-24 DIAGNOSIS — R52 Pain, unspecified: Secondary | ICD-10-CM | POA: Diagnosis not present

## 2018-01-24 DIAGNOSIS — I700 Atherosclerosis of aorta: Secondary | ICD-10-CM | POA: Diagnosis present

## 2018-01-24 DIAGNOSIS — Z79890 Hormone replacement therapy (postmenopausal): Secondary | ICD-10-CM

## 2018-01-24 DIAGNOSIS — I2729 Other secondary pulmonary hypertension: Secondary | ICD-10-CM | POA: Diagnosis present

## 2018-01-24 DIAGNOSIS — I2722 Pulmonary hypertension due to left heart disease: Secondary | ICD-10-CM | POA: Diagnosis present

## 2018-01-24 DIAGNOSIS — Z7951 Long term (current) use of inhaled steroids: Secondary | ICD-10-CM

## 2018-01-24 DIAGNOSIS — J9621 Acute and chronic respiratory failure with hypoxia: Secondary | ICD-10-CM

## 2018-01-24 DIAGNOSIS — E8770 Fluid overload, unspecified: Secondary | ICD-10-CM | POA: Diagnosis present

## 2018-01-24 DIAGNOSIS — M48061 Spinal stenosis, lumbar region without neurogenic claudication: Secondary | ICD-10-CM | POA: Diagnosis present

## 2018-01-24 DIAGNOSIS — I2723 Pulmonary hypertension due to lung diseases and hypoxia: Secondary | ICD-10-CM | POA: Diagnosis present

## 2018-01-24 DIAGNOSIS — I2510 Atherosclerotic heart disease of native coronary artery without angina pectoris: Secondary | ICD-10-CM | POA: Diagnosis present

## 2018-01-24 DIAGNOSIS — Z96643 Presence of artificial hip joint, bilateral: Secondary | ICD-10-CM | POA: Diagnosis present

## 2018-01-24 DIAGNOSIS — J441 Chronic obstructive pulmonary disease with (acute) exacerbation: Secondary | ICD-10-CM | POA: Diagnosis not present

## 2018-01-24 DIAGNOSIS — Z6829 Body mass index (BMI) 29.0-29.9, adult: Secondary | ICD-10-CM

## 2018-01-24 DIAGNOSIS — E890 Postprocedural hypothyroidism: Secondary | ICD-10-CM | POA: Diagnosis present

## 2018-01-24 DIAGNOSIS — R251 Tremor, unspecified: Secondary | ICD-10-CM | POA: Diagnosis present

## 2018-01-24 DIAGNOSIS — D869 Sarcoidosis, unspecified: Secondary | ICD-10-CM | POA: Diagnosis not present

## 2018-01-24 DIAGNOSIS — R0600 Dyspnea, unspecified: Secondary | ICD-10-CM

## 2018-01-24 DIAGNOSIS — Z6830 Body mass index (BMI) 30.0-30.9, adult: Secondary | ICD-10-CM | POA: Diagnosis not present

## 2018-01-24 DIAGNOSIS — K219 Gastro-esophageal reflux disease without esophagitis: Secondary | ICD-10-CM | POA: Diagnosis present

## 2018-01-24 DIAGNOSIS — I119 Hypertensive heart disease without heart failure: Secondary | ICD-10-CM | POA: Diagnosis present

## 2018-01-24 DIAGNOSIS — Z87891 Personal history of nicotine dependence: Secondary | ICD-10-CM

## 2018-01-24 DIAGNOSIS — M5489 Other dorsalgia: Secondary | ICD-10-CM | POA: Diagnosis not present

## 2018-01-24 DIAGNOSIS — I272 Pulmonary hypertension, unspecified: Secondary | ICD-10-CM | POA: Diagnosis not present

## 2018-01-24 DIAGNOSIS — M4696 Unspecified inflammatory spondylopathy, lumbar region: Secondary | ICD-10-CM | POA: Diagnosis present

## 2018-01-24 DIAGNOSIS — Z880 Allergy status to penicillin: Secondary | ICD-10-CM

## 2018-01-24 DIAGNOSIS — Z7982 Long term (current) use of aspirin: Secondary | ICD-10-CM

## 2018-01-24 DIAGNOSIS — R05 Cough: Secondary | ICD-10-CM | POA: Diagnosis not present

## 2018-01-24 DIAGNOSIS — Z79899 Other long term (current) drug therapy: Secondary | ICD-10-CM

## 2018-01-24 DIAGNOSIS — K449 Diaphragmatic hernia without obstruction or gangrene: Secondary | ICD-10-CM | POA: Diagnosis present

## 2018-01-24 DIAGNOSIS — Z825 Family history of asthma and other chronic lower respiratory diseases: Secondary | ICD-10-CM

## 2018-01-24 DIAGNOSIS — I499 Cardiac arrhythmia, unspecified: Secondary | ICD-10-CM | POA: Diagnosis not present

## 2018-01-24 DIAGNOSIS — I491 Atrial premature depolarization: Secondary | ICD-10-CM | POA: Diagnosis not present

## 2018-01-24 HISTORY — DX: Diaphragmatic hernia without obstruction or gangrene: K44.9

## 2018-01-24 HISTORY — DX: Chronic respiratory failure with hypoxia: J96.11

## 2018-01-24 HISTORY — DX: Pulmonary hypertension, unspecified: I27.20

## 2018-01-24 HISTORY — DX: Emphysema, unspecified: J43.9

## 2018-01-24 LAB — CBC WITH DIFFERENTIAL/PLATELET
BASOS ABS: 0 10*3/uL (ref 0.0–0.1)
Basophils Relative: 0 %
Eosinophils Absolute: 0.1 10*3/uL (ref 0.0–0.7)
Eosinophils Relative: 1 %
HEMATOCRIT: 39.9 % (ref 36.0–46.0)
HEMOGLOBIN: 13.4 g/dL (ref 12.0–15.0)
LYMPHS ABS: 1.1 10*3/uL (ref 0.7–4.0)
Lymphocytes Relative: 11 %
MCH: 30.6 pg (ref 26.0–34.0)
MCHC: 33.6 g/dL (ref 30.0–36.0)
MCV: 91.1 fL (ref 78.0–100.0)
Monocytes Absolute: 0.5 10*3/uL (ref 0.1–1.0)
Monocytes Relative: 4 %
NEUTROS ABS: 8.7 10*3/uL — AB (ref 1.7–7.7)
Neutrophils Relative %: 84 %
Platelets: 157 10*3/uL (ref 150–400)
RBC: 4.38 MIL/uL (ref 3.87–5.11)
RDW: 13.5 % (ref 11.5–15.5)
WBC: 10.4 10*3/uL (ref 4.0–10.5)

## 2018-01-24 LAB — BASIC METABOLIC PANEL
ANION GAP: 7 (ref 5–15)
BUN: 14 mg/dL (ref 6–20)
CALCIUM: 8.9 mg/dL (ref 8.9–10.3)
CO2: 22 mmol/L (ref 22–32)
Chloride: 109 mmol/L (ref 101–111)
Creatinine, Ser: 1.12 mg/dL — ABNORMAL HIGH (ref 0.44–1.00)
GFR calc Af Amer: 55 mL/min — ABNORMAL LOW (ref >60)
GFR, EST NON AFRICAN AMERICAN: 48 mL/min — AB (ref >60)
GLUCOSE: 127 mg/dL — AB (ref 65–99)
POTASSIUM: 3.7 mmol/L (ref 3.5–5.1)
Sodium: 138 mmol/L (ref 135–145)

## 2018-01-24 LAB — TROPONIN I: Troponin I: <0.03 ng/mL (ref <0.03)

## 2018-01-24 LAB — BRAIN NATRIURETIC PEPTIDE: B NATRIURETIC PEPTIDE 5: 566.9 pg/mL — AB (ref 0.0–100.0)

## 2018-01-24 MED ORDER — GUAIFENESIN ER 600 MG PO TB12
600.0000 mg | ORAL_TABLET | Freq: Two times a day (BID) | ORAL | Status: DC
  Filled 2018-01-24: qty 1

## 2018-01-24 MED ORDER — ENOXAPARIN SODIUM 40 MG/0.4ML ~~LOC~~ SOLN
40.0000 mg | Freq: Every day | SUBCUTANEOUS | Status: DC
  Administered 2018-01-25 – 2018-01-27 (×4): 40 mg via SUBCUTANEOUS
  Filled 2018-01-24 (×4): qty 0.4

## 2018-01-24 MED ORDER — IPRATROPIUM BROMIDE 0.02 % IN SOLN: 0.5000 mg | Freq: Four times a day (QID) | RESPIRATORY_TRACT | Status: DC

## 2018-01-24 MED ORDER — SODIUM CHLORIDE 0.9 % IV SOLN
INTRAVENOUS | Status: DC
  Administered 2018-01-24: 10 mL/h via INTRAVENOUS

## 2018-01-24 MED ORDER — METHYLPREDNISOLONE SODIUM SUCC 125 MG IJ SOLR
125.0000 mg | Freq: Once | INTRAMUSCULAR | Status: AC
  Administered 2018-01-24: 125 mg via INTRAVENOUS
  Filled 2018-01-24: qty 2

## 2018-01-24 MED ORDER — FUROSEMIDE 10 MG/ML IJ SOLN
40.0000 mg | Freq: Once | INTRAMUSCULAR | Status: AC
Start: 2018-01-24 — End: 2018-01-25
  Administered 2018-01-25: 40 mg via INTRAVENOUS
  Filled 2018-01-24: qty 4

## 2018-01-24 MED ORDER — ALBUTEROL SULFATE (2.5 MG/3ML) 0.083% IN NEBU
5.0000 mg | INHALATION_SOLUTION | Freq: Once | RESPIRATORY_TRACT | Status: AC
  Administered 2018-01-24: 5 mg via RESPIRATORY_TRACT
  Filled 2018-01-24: qty 6

## 2018-01-24 MED ORDER — IPRATROPIUM BROMIDE 0.02 % IN SOLN
0.5000 mg | Freq: Once | RESPIRATORY_TRACT | Status: AC
Start: 2018-01-24 — End: 2018-01-24
  Administered 2018-01-24: 0.5 mg via RESPIRATORY_TRACT
  Filled 2018-01-24: qty 2.5

## 2018-01-24 MED ORDER — ALBUTEROL SULFATE (2.5 MG/3ML) 0.083% IN NEBU: 2.5000 mg | INHALATION_SOLUTION | Freq: Four times a day (QID) | RESPIRATORY_TRACT | Status: DC

## 2018-01-24 MED ORDER — ALBUTEROL SULFATE (2.5 MG/3ML) 0.083% IN NEBU: 2.5000 mg | INHALATION_SOLUTION | RESPIRATORY_TRACT | Status: DC | PRN

## 2018-01-24 MED ORDER — SODIUM CHLORIDE 0.9 % IV SOLN: 250.0000 mL | INTRAVENOUS | Status: DC | PRN

## 2018-01-24 MED ORDER — SODIUM CHLORIDE 0.9% FLUSH: 3.0000 mL | INTRAVENOUS | Status: DC | PRN

## 2018-01-24 MED ORDER — SODIUM CHLORIDE 0.9% FLUSH
3.0000 mL | Freq: Two times a day (BID) | INTRAVENOUS | Status: DC
  Administered 2018-01-25 – 2018-01-28 (×7): 3 mL via INTRAVENOUS

## 2018-01-24 NOTE — ED Notes (Signed)
Bed: SL37 Expected date:  Expected time:  Means of arrival:  Comments: 69F SOB on nonrebreather

## 2018-01-24 NOTE — H&P (Signed)
History and Physical    Kimberly Irwin JSR:[id number] DOB: 03-Jun-1946 DOA: 01/24/2018  PCP: Kimberly Lukes, MD  Patient coming from: Home  Chief Complaint: Shortness of breath  HPI: Kimberly Irwin is a 72 y.o. female with medical history significant of COPD, chronic respiratory failure on 4 L of oxygen continues at home, chronic pain syndrome, hypertension, sarcoidosis comes in with 2 days of productive cough and shortness of breath.  She denies any lower extremity edema.  She denies any orthopnea or PND.  She denies any chest pain.  Patient had called and she was noted to have oxygen sats in the 80s despite her 4 L of oxygen.  911 patient be referred for admission for acute on chronic respiratory failure likely secondary to COPD exacerbation.  Review of Systems: As per HPI otherwise 10 point review of systems negative.   Past Medical History:  Diagnosis Date  . Acute hip pain 10/26/2012  . Bronchitis 09/04/2016  . Bursitis of left hip 10/26/2012  . Cardiomegaly 07/13/2016  . Chronic low back pain   . Coronary artery calcification 07/03/2016  . DDD (degenerative disc disease)    L3-4 with facet arthropathy and stenosis  . Degenerative spondylolisthesis    L4-5 grade 1 with stenosis  . GERD (gastroesophageal reflux disease)   . Heart murmur    as an infant  . History of hiatal hernia   . Hyperlipidemia   . Hypertension   . Hypothyroidism   . Hypothyroidism 10/28/2015  . Hypoxia, sleep related 10/21/2016  . Pain in joint, lower leg 10/26/2012   left   . Pneumonia    as a child several times.   . Sarcoidosis   . SOB (shortness of breath) 04/03/2016  . Tremors of nervous system     Past Surgical History:  Procedure Laterality Date  . ANTERIOR LAT LUMBAR FUSION Left 08/29/2014   Procedure: EXTREME LEFT LATERAL INTERBODY FUSION LUMBAR TWO-THREE LATERAL PLATE;  Surgeon: Charlie Pitter, MD;  Location: [place] [facility];  Service: Neurosurgery;  Laterality: Left;  . BACK SURGERY  01/15/09   L3-4 and L4-5 decompressive laminectomy with bilateral L3, L4, and L5 decompressive foraminotomies, more than it would be required for simple interbody fusion alone.  Marland Kitchen BACK SURGERY  01/15/09   L3-4 and L4-5 posterior lumbar interbody fusion utilizing tanget interbody allograft wedge, Telamon interbody PEEk cage, and local autografting.  Marland Kitchen BACK SURGERY  01/15/09   L3, L4, and L5 posterolateral arthrodesis using segmental pedicle screw fixation and local autografting.  Marland Kitchen CARDIAC CATHETERIZATION    . CARPAL TUNNEL RELEASE Bilateral   . COLONOSCOPY    . ESOPHAGOGASTRODUODENOSCOPY    . EYE SURGERY     lens implant  . JOINT REPLACEMENT  09/13/09   Right Hip, Dr. Alvan Dame  . KYPHOPLASTY N/A 09/14/2014   Procedure: Lumbar Two Kyphoplasty/Vertebroplasty;  Surgeon: Charlie Pitter, MD;  Location: [place] [facility];  Service: Neurosurgery;  Laterality: N/A;  Lumbar Two Kyphoplasty/Vertebroplasty  . RIGHT HEART CATH N/A 11/19/2017   Procedure: RIGHT HEART CATH;  Surgeon: Larey Dresser, MD;  Location: [place] [facility];  Service: Cardiovascular;  Laterality: N/A;  . THYROIDECTOMY    . TOTAL HIP ARTHROPLASTY Left 06/28/2013   Procedure: LEFT TOTAL  HIP ARTHROPLASTY ANTERIOR APPROACH;  Surgeon: Mauri Pole, MD;  Location: [facility];  Service: Orthopedics;  Laterality: Left;     reports that she quit smoking about 23 years ago. Her smoking use included cigarettes. She has a  16.00 pack-year smoking history. She has never used smokeless tobacco. She reports that she does not drink alcohol or use drugs.  Allergies  Allergen Reactions  . Lipitor [Atorvastatin] Swelling  . Penicillins Swelling    Has patient had a PCN reaction causing immediate rash, facial/tongue/throat swelling, SOB or lightheadedness with hypotension: Yes Has patient had a PCN reaction causing severe rash involving mucus membranes or skin necrosis: No Has patient had a PCN reaction that required hospitalization: No Has patient had a PCN reaction  occurring within the last 10 years: No If all of the above answers are "NO", then may proceed with Cephalosporin use.   . Metronidazole Swelling  . Pregabalin Other (See Comments)    REACTION: Somnolence and dizziness  . Simvastatin Other (See Comments)    Leg pain    Family History  Problem Relation Age of Onset  . Cancer Mother 58       Breast  . Heart disease Mother   . Hypertension Mother   . Hyperlipidemia Mother   . Heart attack Mother   . Asthma Maternal Grandmother     Prior to Admission medications   Medication Sig Start Date End Date Taking? Authorizing Provider  albuterol (PROVENTIL HFA;VENTOLIN HFA) 108 (90 Base) MCG/ACT inhaler Inhale 2 puffs into the lungs every 6 (six) hours as needed for wheezing or shortness of breath. 07/01/16   de Dios, [place] A, MD  albuterol (PROVENTIL) (2.5 MG/3ML) 0.083% nebulizer solution Take 3 mLs (2.5 mg total) by nebulization every 6 (six) hours as needed for wheezing or shortness of breath. 03/30/17   Kimberly Lukes, MD  ambrisentan (LETAIRIS) 5 MG tablet Take 1 tablet (5 mg total) by mouth daily. 12/11/17   Juanito Doom, MD  aspirin EC 81 MG tablet Take 81 mg by mouth at bedtime.     [provider]  atenolol (TENORMIN) 50 MG tablet TAKE 1 TABLET (50 MG TOTAL) BY MOUTH DAILY. Patient taking differently: Take 50 mg by mouth daily.  10/19/17   Kimberly Lukes, MD  BESIVANCE 0.6 % SUSP Place 1 drop into the left eye See admin instructions. INSTILL 1 DROP INTO LEFT 4 TIMES DAILY x 2 DAYS AFTER EYE INJECTION 12/18/14   [provider]  budesonide-formoterol (SYMBICORT) 160-4.5 MCG/ACT inhaler Inhale 2 puffs into the lungs 2 (two) times daily. 10/19/17   Kimberly Lukes, MD  Calcium Carbonate-Vit D-Min (CALCIUM 1200 PO) Take 1 tablet by mouth daily.     [provider]  Cholecalciferol (VITAMIN D) 2000 UNITS tablet Take 2,000 Units by mouth at bedtime.     [provider]  Cinnamon 500 MG capsule Take  2,000 mg by mouth daily.      [provider]  CINNAMON PO Take 2 capsules by mouth daily. 1000 MG PER CAPSULE    [provider]  citalopram (CELEXA) 10 MG tablet Take 1 tablet (10 mg total) by mouth at bedtime. 12/11/17   Kimberly Lukes, MD  Coenzyme Q10 200 MG capsule Take 200 mg by mouth at bedtime.     [provider]  fexofenadine-pseudoephedrine (ALLEGRA-D 24) 180-240 MG 24 hr tablet Take 1 tablet by mouth daily.    [provider]  gabapentin (NEURONTIN) 300 MG capsule TAKE 1 CAPSULE (300 MG TOTAL) BY MOUTH AT BEDTIME. 01/04/18   Kimberly Lukes, MD  Glucosamine HCl (GLUCOSAMINE PO) Take 1 tablet by mouth at bedtime.     [provider]  ketoconazole (NIZORAL)  2 % shampoo Apply 1 application topically 2 (two) times a week. Patient taking differently: Apply 1 application topically every 14 (fourteen) days. ONCE EVERY 2 WEEKS PER PATIENT 10/19/17   Kimberly Lukes, MD  levothyroxine (SYNTHROID, LEVOTHROID) 112 MCG tablet TAKE 1 TABLET (112 MCG TOTAL) BY MOUTH DAILY BEFORE BREAKFAST. 10/19/17   Kimberly Lukes, MD  mometasone (NASONEX) 50 MCG/ACT nasal spray Place 2 sprays daily as needed into the nose. Patient taking differently: Place 2 sprays into the nose daily as needed (FOR ALLERGIES.).  06/19/17   Kimberly Lukes, MD  mupirocin ointment (BACTROBAN) 2 % Apply twice daily to bridge of the nose. Patient taking differently: Apply 1 application topically 2 (two) times daily as needed (for redness on the bridge of nose).  01/04/15   Saguier, Percell Miller, PA-C  mupirocin ointment (BACTROBAN) 2 % Place 1 application into the nose 2 (two) times daily. 12/25/17   Kimberly Lukes, MD  Omega-3 Fatty Acids (FISH OIL) 1200 MG CAPS Take 2,400 mg by mouth daily.     [provider]  omeprazole (PRILOSEC) 40 MG capsule TAKE 1 CAPSULE (40 MG TOTAL) BY MOUTH DAILY. 12/04/17   Kimberly Lukes, MD  rosuvastatin (CRESTOR) 20 MG tablet TAKE 1 TABLET (20 MG TOTAL) BY  MOUTH DAILY. Patient taking differently: Take 20 mg by mouth at bedtime.  07/10/17   Kimberly Lukes, MD  tadalafil, PAH, (ADCIRCA) 20 MG tablet Take 1 tablet (20 mg total) by mouth daily. 01/11/18   Larey Dresser, MD  traMADol (ULTRAM) 50 MG tablet Take 2 tablets (100 mg total) by mouth every 6 (six) hours as needed for moderate pain or severe pain. 12/04/14   Kimberly Lukes, MD  vitamin B-12 (CYANOCOBALAMIN) 1000 MCG tablet Take 1,000 mcg by mouth daily.    [provider]    Physical Exam: Vitals:   01/24/18 2112 01/24/18 2115 01/24/18 2200  BP: (!) 151/89 140/79 135/79  Pulse: 69 65 71  Resp: (!) 24 (!) 22 (!) 25  SpO2: 97% 98% 97%    Constitutional: NAD, calm, comfortable chronic resting tremor Vitals:   01/24/18 2112 01/24/18 2115 01/24/18 2200  BP: (!) 151/89 140/79 135/79  Pulse: 69 65 71  Resp: (!) 24 (!) 22 (!) 25  SpO2: 97% 98% 97%   Eyes: PERRL, lids and conjunctivae normal ENMT: Mucous membranes are moist. Posterior pharynx clear of any exudate or lesions.Normal dentition.  Neck: normal, supple, no masses, no thyromegaly Respiratory: Diminished but clear to auscultation bilaterally, no wheezing, no crackles. Normal respiratory effort. No accessory muscle use.  Cardiovascular: Regular rate and rhythm, no murmurs / rubs / gallops. No extremity edema. 2+ pedal pulses. No carotid bruits.  Abdomen: no tenderness, no masses palpated. No hepatosplenomegaly. Bowel sounds positive.  Musculoskeletal: no clubbing / cyanosis. No joint deformity upper and lower extremities. Good ROM, no contractures. Normal muscle tone.  Skin: no rashes, lesions, ulcers. No induration Neurologic: CN 2-12 grossly intact. Sensation intact, DTR normal. Strength 5/5 in all 4.  Psychiatric: Normal judgment and insight. Alert and oriented x 3. Normal mood.    Labs on Admission: I have personally reviewed following labs and imaging studies  CBC: Recent Labs  Lab 01/24/18 2149  WBC 10.4    NEUTROABS 8.7*  HGB 13.4  HCT 39.9  MCV 91.1  PLT 665   Basic Metabolic Panel: Recent Labs  Lab 01/24/18 2149  NA 138  K 3.7  CL 109  CO2 22  GLUCOSE 127*  BUN 14  CREATININE 1.12*  CALCIUM 8.9   GFR: Estimated Creatinine Clearance: 43.9 mL/min (A) (by C-G formula based on SCr of 1.12 mg/dL (H)). Liver Function Tests: No results for input(s): AST, ALT, ALKPHOS, BILITOT, PROT, ALBUMIN in the last 168 hours. No results for input(s): LIPASE, AMYLASE in the last 168 hours. No results for input(s): AMMONIA in the last 168 hours. Coagulation Profile: No results for input(s): INR, PROTIME in the last 168 hours. Cardiac Enzymes: Recent Labs  Lab 01/24/18 2149  TROPONINI <0.03   BNP (last 3 results) No results for input(s): PROBNP in the last 8760 hours. HbA1C: No results for input(s): HGBA1C in the last 72 hours. CBG: No results for input(s): GLUCAP in the last 168 hours. Lipid Profile: No results for input(s): CHOL, HDL, LDLCALC, TRIG, CHOLHDL, LDLDIRECT in the last 72 hours. Thyroid Function Tests: No results for input(s): TSH, T4TOTAL, FREET4, T3FREE, THYROIDAB in the last 72 hours. Anemia Panel: No results for input(s): VITAMINB12, FOLATE, FERRITIN, TIBC, IRON, RETICCTPCT in the last 72 hours. Urine analysis:    Component Value Date/Time   COLORURINE YELLOW 08/28/2014 1122   APPEARANCEUR CLEAR 08/28/2014 1122   LABSPEC 1.015 08/28/2014 1122   PHURINE 6.0 08/28/2014 1122   GLUCOSEU NEGATIVE 08/28/2014 1122   HGBUR NEGATIVE 08/28/2014 1122   HGBUR negative 06/01/2008 [place] 08/28/2014 1122   KETONESUR NEGATIVE 08/28/2014 1122   PROTEINUR NEGATIVE 04/22/2014 1703   UROBILINOGEN 0.2 08/28/2014 1122   NITRITE NEGATIVE 08/28/2014 1122   LEUKOCYTESUR SMALL (A) 08/28/2014 1122   Sepsis Labs: !!!!!!!!!!!!!!!!!!!!!!!!!!!!!!!!!!!!!!!!!!!! _0 (procalcitonin:4,lacticidven:4) )No results found for this or any previous visit (from the past  240 hour(s)).   Radiological Exams on Admission: Dg Chest Port 1 View  Result Date: 01/24/2018 CLINICAL DATA:  Dyspnea and hypoxia with cough. EXAM: PORTABLE CHEST 1 VIEW COMPARISON:  12/09/2017 FINDINGS: Cardiomegaly with increased interstitial lung markings and peribronchial thickening compatible with chronic bronchitic change. No alveolar consolidation, CHF, effusion or pneumothorax. Aortic atherosclerosis is noted near the arch. No aneurysm. No acute osseous abnormality. IMPRESSION: Stable cardiomegaly with aortic atherosclerosis. Chronic diffuse bronchitic change of the lungs. Electronically Signed   By: Ashley Royalty M.D.   On: 01/24/2018 22:00    Old chart review Case discussed with Dr. Zenia Resides in the ED  Assessment/Plan 72 year old female with acute on chronic respiratory failure secondary to COPD exacerbation but likely multifactorial Principal Problem:   COPD with acute exacerbation (HCC)-IV Solu-Medrol frequent nebs.  On 5 L of oxygen right now.  Already has severe disease.  May be a component of mild volume overload.  Given a dose of Lasix 40 mill grams IV once in the ED.  Follow clinically.  Her with azithromycin  Active Problems:   Essential hypertension-resume home meds   Chronic pain syndrome-continue chronic meds   Chronic respiratory failure with hypoxia (HCC)-on 4 L of oxygen at home     DVT prophylaxis: SCDs Code Status: Full Family Communication: Daughters Disposition Plan: 1 to 5 days Consults called: None Admission status: Admission   Wynn Kernes A MD [facility]  If 7PM-7AM, please contact night-coverage [email or web address] Password [facility]  01/24/2018, 10:58 PM

## 2018-01-24 NOTE — ED Triage Notes (Signed)
Pt presents from home c/o shortness of breath and hypoxia associated with severe cough. Pt endorses chronic respiratory problem, on home O2 home use, EMS reported she was on low SATs 80s on her regular 4L nasal canula and exhibiting labored breathing explaining why she arrived on a 10 L non-rebreather. Pt denies chest pain but reports soreness with cough.

## 2018-01-24 NOTE — ED Provider Notes (Signed)
[facility] DEPT Provider Note   CSN: [id number] Arrival date & time: 01/24/18  2051     History   Chief Complaint Chief Complaint  Patient presents with  . Shortness of Breath    HPI Kimberly Irwin is a 72 y.o. female.  72 year old female with history of COPD as well as sarcoidosis presents with worsening dyspnea associated onset of cough times several days.  Patient is normally on home oxygen at 4 L.  She has not had any chest pain or lower extremity edema.  No fever or chills.  EMS called and patient had sats in the low 80s on her regular 4 L.  She had labored breathing.  Was placed on high flow oxygen at 2 L and transported here.     Past Medical History:  Diagnosis Date  . Acute hip pain 10/26/2012  . Bronchitis 09/04/2016  . Bursitis of left hip 10/26/2012  . Cardiomegaly 07/13/2016  . Chronic low back pain   . Coronary artery calcification 07/03/2016  . DDD (degenerative disc disease)    L3-4 with facet arthropathy and stenosis  . Degenerative spondylolisthesis    L4-5 grade 1 with stenosis  . GERD (gastroesophageal reflux disease)   . Heart murmur    as an infant  . History of hiatal hernia   . Hyperlipidemia   . Hypertension   . Hypothyroidism   . Hypothyroidism 10/28/2015  . Hypoxia, sleep related 10/21/2016  . Pain in joint, lower leg 10/26/2012   left   . Pneumonia    as a child several times.   . Sarcoidosis   . SOB (shortness of breath) 04/03/2016  . Tremors of nervous system     Patient Active Problem List   Diagnosis Date Noted  . Epistaxis 12/28/2017  . Hypoxia, sleep related 10/21/2016  . Cardiomegaly 07/13/2016  . Coronary artery calcification 07/03/2016  . COPD (chronic obstructive pulmonary disease) ([place]) 07/01/2016  . Hypersomnia 07/01/2016  . Chronic respiratory failure with hypoxia ([place]) 05/20/2016  . Dermatitis 04/14/2016  . SOB (shortness of breath) 04/03/2016  . Hypothyroidism 10/28/2015  . Lumbar  compression fracture ([place]) 09/14/2014  . Medicare annual wellness visit, subsequent 04/27/2014  . Obesity   . S/P left THA, AA 06/28/2013  . Decreased visual acuity 02/19/2013  . Hyperglycemia 04/04/2011  . Chronic pain syndrome 01/20/2011  . Depression 08/10/2008  . GERD 04/26/2008  . Lumbar disc disease 05/31/2007  . 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 Hyperlipidemia 05/29/2007  . Tremor   . Essential hypertension     Past Surgical History:  Procedure Laterality Date  . ANTERIOR LAT LUMBAR FUSION Left 08/29/2014   Procedure: EXTREME LEFT LATERAL INTERBODY FUSION LUMBAR TWO-THREE LATERAL PLATE;  Surgeon: Charlie Pitter, MD;  Location: [facility];  Service: Neurosurgery;  Laterality: Left;  . BACK SURGERY  01/15/09   L3-4 and L4-5 decompressive laminectomy with bilateral L3, L4, and L5 decompressive foraminotomies, more than it would be required for simple interbody fusion alone.  Marland Kitchen BACK SURGERY  01/15/09   L3-4 and L4-5 posterior lumbar interbody fusion utilizing tanget interbody allograft wedge, Telamon interbody PEEk cage, and local autografting.  Marland Kitchen BACK SURGERY  01/15/09   L3, L4, and L5 posterolateral arthrodesis using segmental pedicle screw fixation and local autografting.  Marland Kitchen CARDIAC CATHETERIZATION    . CARPAL TUNNEL RELEASE Bilateral   . COLONOSCOPY    . ESOPHAGOGASTRODUODENOSCOPY    . EYE SURGERY     lens implant  .  JOINT REPLACEMENT  09/13/09   Right Hip, Dr. Alvan Dame  . KYPHOPLASTY N/A 09/14/2014   Procedure: Lumbar Two Kyphoplasty/Vertebroplasty;  Surgeon: Charlie Pitter, MD;  Location: [facility];  Service: Neurosurgery;  Laterality: N/A;  Lumbar Two Kyphoplasty/Vertebroplasty  . RIGHT HEART CATH N/A 11/19/2017   Procedure: RIGHT HEART CATH;  Surgeon: Larey Dresser, MD;  Location: [facility];  Service: Cardiovascular;  Laterality: N/A;  . THYROIDECTOMY    . TOTAL HIP ARTHROPLASTY Left 06/28/2013   Procedure: LEFT TOTAL  HIP ARTHROPLASTY ANTERIOR APPROACH;  Surgeon:  Mauri Pole, MD;  Location: [facility];  Service: Orthopedics;  Laterality: Left;     OB History   None      Home Medications    Prior to Admission medications   Medication Sig Start Date End Date Taking? Authorizing Provider  albuterol (PROVENTIL HFA;VENTOLIN HFA) 108 (90 Base) MCG/ACT inhaler Inhale 2 puffs into the lungs every 6 (six) hours as needed for wheezing or shortness of breath. 07/01/16   de Dios, Wilson A, MD  albuterol (PROVENTIL) (2.5 MG/3ML) 0.083% nebulizer solution Take 3 mLs (2.5 mg total) by nebulization every 6 (six) hours as needed for wheezing or shortness of breath. 03/30/17   Mosie Lukes, MD  ambrisentan (LETAIRIS) 5 MG tablet Take 1 tablet (5 mg total) by mouth daily. 12/11/17   Juanito Doom, MD  aspirin EC 81 MG tablet Take 81 mg by mouth at bedtime.     [provider]  atenolol (TENORMIN) 50 MG tablet TAKE 1 TABLET (50 MG TOTAL) BY MOUTH DAILY. Patient taking differently: Take 50 mg by mouth daily.  10/19/17   Mosie Lukes, MD  BESIVANCE 0.6 % SUSP Place 1 drop into the left eye See admin instructions. INSTILL 1 DROP INTO LEFT 4 TIMES DAILY x 2 DAYS AFTER EYE INJECTION 12/18/14   [provider]  budesonide-formoterol (SYMBICORT) 160-4.5 MCG/ACT inhaler Inhale 2 puffs into the lungs 2 (two) times daily. 10/19/17   Mosie Lukes, MD  Calcium Carbonate-Vit D-Min (CALCIUM 1200 PO) Take 1 tablet by mouth daily.     [provider]  Cholecalciferol (VITAMIN D) 2000 UNITS tablet Take 2,000 Units by mouth at bedtime.     [provider]  Cinnamon 500 MG capsule Take 2,000 mg by mouth daily.      [provider]  CINNAMON PO Take 2 capsules by mouth daily. 1000 MG PER CAPSULE    [provider]  citalopram (CELEXA) 10 MG tablet Take 1 tablet (10 mg total) by mouth at bedtime. 12/11/17   Mosie Lukes, MD  Coenzyme Q10 200 MG capsule Take 200 mg by mouth at bedtime.     [provider]    fexofenadine-pseudoephedrine (ALLEGRA-D 24) 180-240 MG 24 hr tablet Take 1 tablet by mouth daily.    [provider]  gabapentin (NEURONTIN) 300 MG capsule TAKE 1 CAPSULE (300 MG TOTAL) BY MOUTH AT BEDTIME. 01/04/18   Mosie Lukes, MD  Glucosamine HCl (GLUCOSAMINE PO) Take 1 tablet by mouth at bedtime.     [provider]  ketoconazole (NIZORAL) 2 % shampoo Apply 1 application topically 2 (two) times a week. Patient taking differently: Apply 1 application topically every 14 (fourteen) days. ONCE EVERY 2 WEEKS PER PATIENT 10/19/17   Mosie Lukes, MD  levothyroxine (SYNTHROID, LEVOTHROID) 112 MCG tablet TAKE 1 TABLET (112 MCG TOTAL) BY MOUTH DAILY BEFORE BREAKFAST. 10/19/17   Mosie Lukes,  MD  mometasone (NASONEX) 50 MCG/ACT nasal spray Place 2 sprays daily as needed into the nose. Patient taking differently: Place 2 sprays into the nose daily as needed (FOR ALLERGIES.).  06/19/17   Mosie Lukes, MD  mupirocin ointment (BACTROBAN) 2 % Apply twice daily to bridge of the nose. Patient taking differently: Apply 1 application topically 2 (two) times daily as needed (for redness on the bridge of nose).  01/04/15   Saguier, Percell Miller, PA-C  mupirocin ointment (BACTROBAN) 2 % Place 1 application into the nose 2 (two) times daily. 12/25/17   Mosie Lukes, MD  Omega-3 Fatty Acids (FISH OIL) 1200 MG CAPS Take 2,400 mg by mouth daily.     [provider]  omeprazole (PRILOSEC) 40 MG capsule TAKE 1 CAPSULE (40 MG TOTAL) BY MOUTH DAILY. 12/04/17   Mosie Lukes, MD  rosuvastatin (CRESTOR) 20 MG tablet TAKE 1 TABLET (20 MG TOTAL) BY MOUTH DAILY. Patient taking differently: Take 20 mg by mouth at bedtime.  07/10/17   Mosie Lukes, MD  tadalafil, PAH, (ADCIRCA) 20 MG tablet Take 1 tablet (20 mg total) by mouth daily. 01/11/18   Larey Dresser, MD  traMADol (ULTRAM) 50 MG tablet Take 2 tablets (100 mg total) by mouth every 6 (six) hours as needed for moderate pain or severe pain.  12/04/14   Mosie Lukes, MD  vitamin B-12 (CYANOCOBALAMIN) 1000 MCG tablet Take 1,000 mcg by mouth daily.    [provider]    Family History Family History  Problem Relation Age of Onset  . Cancer Mother 83       Breast  . Heart disease Mother   . Hypertension Mother   . Hyperlipidemia Mother   . Heart attack Mother   . Asthma Maternal Grandmother     Social History Social History   Tobacco Use  . Smoking status: Former Smoker    Packs/day: 0.50    Years: 32.00    Pack years: 16.00    Types: Cigarettes    Last attempt to quit: 07/22/1994    Years since quitting: 23.5  . Smokeless tobacco: Never Used  Substance Use Topics  . Alcohol use: No    Alcohol/week: 0.0 oz  . Drug use: No     Allergies   Lipitor [atorvastatin]; Penicillins; Metronidazole; Pregabalin; and Simvastatin   Review of Systems Review of Systems  All other systems reviewed and are negative.    Physical Exam Updated Vital Signs BP (!) 151/89 (BP Location: Right Arm)   Pulse 69   Resp (!) 24   SpO2 97%   Physical Exam  Constitutional: She is oriented to person, place, and time. She appears well-developed and well-nourished.  Non-toxic appearance. No distress.  HENT:  Head: Normocephalic and atraumatic.  Eyes: Pupils are equal, round, and reactive to light. Conjunctivae, EOM and lids are normal.  Neck: Normal range of motion. Neck supple. No tracheal deviation present. No thyroid mass present.  Cardiovascular: Normal rate, regular rhythm and normal heart sounds. Exam reveals no gallop.  No murmur heard. Pulmonary/Chest: Effort normal. No stridor. No respiratory distress. She has decreased breath sounds in the right lower field and the left lower field. She has no wheezes. She has no rhonchi. She has no rales.  Abdominal: Soft. Normal appearance and bowel sounds are normal. She exhibits no distension. There is no tenderness. There is no rebound and no CVA tenderness.    Musculoskeletal: Normal range of motion. She exhibits no edema  or tenderness.  Neurological: She is alert and oriented to person, place, and time. She has normal strength. No cranial nerve deficit or sensory deficit. GCS eye subscore is 4. GCS verbal subscore is 5. GCS motor subscore is 6.  Skin: Skin is warm and dry. No abrasion and no rash noted.  Psychiatric: She has a normal mood and affect. Her speech is normal and behavior is normal.  Nursing note and vitals reviewed.    ED Treatments / Results  Labs (all labs ordered are listed, but only abnormal results are displayed) Labs Reviewed  CBC WITH DIFFERENTIAL/PLATELET  BASIC METABOLIC PANEL  BRAIN NATRIURETIC PEPTIDE  TROPONIN I    EKG None  Radiology No results found.  Procedures Procedures (including critical care time)  Medications Ordered in ED Medications  0.9 %  sodium chloride infusion (has no administration in time range)     Initial Impression / Assessment and Plan / ED Course  I have reviewed the triage vital signs and the nursing notes.  Pertinent labs & imaging results that were available during my care of the patient were reviewed by me and considered in my medical decision making (see chart for details).     Patient treated with albuterol and Solu-Medrol.  Also given Lasix for her elevated BNP.  Will admit to the hospitalist service  Final Clinical Impressions(s) / ED Diagnoses   Final diagnoses:  None    ED Discharge Orders    None       Lacretia Leigh, MD 01/24/18 2255

## 2018-01-25 ENCOUNTER — Telehealth (HOSPITAL_COMMUNITY): Payer: Self-pay | Admitting: *Deleted

## 2018-01-25 ENCOUNTER — Ambulatory Visit: Payer: Medicare Other | Admitting: Pulmonary Disease

## 2018-01-25 ENCOUNTER — Inpatient Hospital Stay (HOSPITAL_COMMUNITY): Payer: Medicare Other

## 2018-01-25 ENCOUNTER — Telehealth: Payer: Self-pay | Admitting: Pulmonary Disease

## 2018-01-25 ENCOUNTER — Other Ambulatory Visit: Payer: Self-pay

## 2018-01-25 LAB — BASIC METABOLIC PANEL
ANION GAP: 10 (ref 5–15)
BUN: 15 mg/dL (ref 6–20)
CALCIUM: 9.3 mg/dL (ref 8.9–10.3)
CO2: 27 mmol/L (ref 22–32)
Chloride: 104 mmol/L (ref 101–111)
Creatinine, Ser: 1.11 mg/dL — ABNORMAL HIGH (ref 0.44–1.00)
GFR calc Af Amer: 56 mL/min — ABNORMAL LOW (ref >60)
GFR, EST NON AFRICAN AMERICAN: 48 mL/min — AB (ref >60)
GLUCOSE: 172 mg/dL — AB (ref 65–99)
POTASSIUM: 3.6 mmol/L (ref 3.5–5.1)
SODIUM: 141 mmol/L (ref 135–145)

## 2018-01-25 LAB — CBC
HCT: 43.9 % (ref 36.0–46.0)
Hemoglobin: 15 g/dL (ref 12.0–15.0)
MCH: 30.8 pg (ref 26.0–34.0)
MCHC: 34.2 g/dL (ref 30.0–36.0)
MCV: 90.1 fL (ref 78.0–100.0)
Platelets: 169 10*3/uL (ref 150–400)
RBC: 4.87 MIL/uL (ref 3.87–5.11)
RDW: 13.6 % (ref 11.5–15.5)
WBC: 6.7 10*3/uL (ref 4.0–10.5)

## 2018-01-25 LAB — RESPIRATORY PANEL BY PCR
Adenovirus: NOT DETECTED
Bordetella pertussis: NOT DETECTED
CHLAMYDOPHILA PNEUMONIAE-RVPPCR: NOT DETECTED
CORONAVIRUS 229E-RVPPCR: NOT DETECTED
CORONAVIRUS OC43-RVPPCR: NOT DETECTED
Coronavirus HKU1: NOT DETECTED
Coronavirus NL63: NOT DETECTED
INFLUENZA B-RVPPCR: NOT DETECTED
Influenza A: NOT DETECTED
MYCOPLASMA PNEUMONIAE-RVPPCR: NOT DETECTED
Metapneumovirus: NOT DETECTED
PARAINFLUENZA VIRUS 1-RVPPCR: NOT DETECTED
Parainfluenza Virus 2: NOT DETECTED
Parainfluenza Virus 3: NOT DETECTED
Parainfluenza Virus 4: NOT DETECTED
RESPIRATORY SYNCYTIAL VIRUS-RVPPCR: NOT DETECTED
Rhinovirus / Enterovirus: NOT DETECTED

## 2018-01-25 MED ORDER — METHYLPREDNISOLONE SODIUM SUCC 125 MG IJ SOLR
60.0000 mg | Freq: Four times a day (QID) | INTRAMUSCULAR | Status: DC
  Administered 2018-01-25 – 2018-01-26 (×4): 60 mg via INTRAVENOUS
  Filled 2018-01-25 (×4): qty 2

## 2018-01-25 MED ORDER — ATENOLOL 25 MG PO TABS
50.0000 mg | ORAL_TABLET | Freq: Every day | ORAL | Status: DC
  Administered 2018-01-25 – 2018-01-28 (×4): 50 mg via ORAL
  Filled 2018-01-25 (×4): qty 2

## 2018-01-25 MED ORDER — GUAIFENESIN ER 600 MG PO TB12
1200.0000 mg | ORAL_TABLET | Freq: Two times a day (BID) | ORAL | Status: DC
  Administered 2018-01-25 – 2018-01-28 (×6): 1200 mg via ORAL
  Filled 2018-01-25 (×7): qty 2

## 2018-01-25 MED ORDER — IPRATROPIUM-ALBUTEROL 0.5-2.5 (3) MG/3ML IN SOLN
3.0000 mL | Freq: Four times a day (QID) | RESPIRATORY_TRACT | Status: DC
  Administered 2018-01-25 (×4): 3 mL via RESPIRATORY_TRACT
  Filled 2018-01-25 (×4): qty 3

## 2018-01-25 MED ORDER — PANTOPRAZOLE SODIUM 40 MG PO TBEC
40.0000 mg | DELAYED_RELEASE_TABLET | Freq: Every day | ORAL | Status: DC
Start: 2018-01-25 — End: 2018-01-28
  Administered 2018-01-25 – 2018-01-28 (×4): 40 mg via ORAL
  Filled 2018-01-25 (×4): qty 1

## 2018-01-25 MED ORDER — TADALAFIL 20 MG PO TABS
20.0000 mg | ORAL_TABLET | Freq: Every day | ORAL | Status: DC
  Administered 2018-01-25 – 2018-01-28 (×4): 20 mg via ORAL
  Filled 2018-01-25 (×4): qty 1

## 2018-01-25 MED ORDER — VITAMIN D 1000 UNITS PO TABS
2000.0000 [IU] | ORAL_TABLET | Freq: Every evening | ORAL | Status: DC
  Administered 2018-01-25 – 2018-01-27 (×3): 2000 [IU] via ORAL
  Filled 2018-01-25 (×3): qty 2

## 2018-01-25 MED ORDER — ATENOLOL 25 MG PO TABS
25.0000 mg | ORAL_TABLET | Freq: Every day | ORAL | Status: DC
  Administered 2018-01-25 – 2018-01-27 (×4): 25 mg via ORAL
  Filled 2018-01-25 (×5): qty 1

## 2018-01-25 MED ORDER — GABAPENTIN 300 MG PO CAPS
300.0000 mg | ORAL_CAPSULE | Freq: Every day | ORAL | Status: DC
  Administered 2018-01-25 – 2018-01-27 (×3): 300 mg via ORAL
  Filled 2018-01-25 (×3): qty 1

## 2018-01-25 MED ORDER — CITALOPRAM HYDROBROMIDE 20 MG PO TABS
10.0000 mg | ORAL_TABLET | Freq: Every day | ORAL | Status: DC
  Administered 2018-01-25 – 2018-01-27 (×4): 10 mg via ORAL
  Filled 2018-01-25 (×4): qty 1

## 2018-01-25 MED ORDER — ROSUVASTATIN CALCIUM 10 MG PO TABS
20.0000 mg | ORAL_TABLET | Freq: Every day | ORAL | Status: DC
  Administered 2018-01-25 – 2018-01-27 (×3): 20 mg via ORAL
  Filled 2018-01-25 (×3): qty 2

## 2018-01-25 MED ORDER — BUDESONIDE 0.5 MG/2ML IN SUSP
0.5000 mg | Freq: Two times a day (BID) | RESPIRATORY_TRACT | Status: DC
  Administered 2018-01-25 – 2018-01-28 (×6): 0.5 mg via RESPIRATORY_TRACT
  Filled 2018-01-25 (×6): qty 2

## 2018-01-25 MED ORDER — IPRATROPIUM-ALBUTEROL 0.5-2.5 (3) MG/3ML IN SOLN
3.0000 mL | Freq: Three times a day (TID) | RESPIRATORY_TRACT | Status: DC
Start: 2018-01-26 — End: 2018-01-27
  Administered 2018-01-26 – 2018-01-27 (×4): 3 mL via RESPIRATORY_TRACT
  Filled 2018-01-25 (×4): qty 3

## 2018-01-25 MED ORDER — OMEGA-3-ACID ETHYL ESTERS 1 G PO CAPS
2.0000 g | ORAL_CAPSULE | Freq: Every day | ORAL | Status: DC
Start: 2018-01-25 — End: 2018-01-28
  Administered 2018-01-25 – 2018-01-28 (×4): 2 g via ORAL
  Filled 2018-01-25 (×4): qty 2

## 2018-01-25 MED ORDER — ARFORMOTEROL TARTRATE 15 MCG/2ML IN NEBU
15.0000 ug | INHALATION_SOLUTION | Freq: Two times a day (BID) | RESPIRATORY_TRACT | Status: DC
  Administered 2018-01-25 – 2018-01-28 (×6): 15 ug via RESPIRATORY_TRACT
  Filled 2018-01-25 (×6): qty 2

## 2018-01-25 MED ORDER — TRAMADOL HCL 50 MG PO TABS: 100.0000 mg | ORAL_TABLET | Freq: Four times a day (QID) | ORAL | Status: DC | PRN

## 2018-01-25 MED ORDER — LEVOTHYROXINE SODIUM 112 MCG PO TABS
112.0000 ug | ORAL_TABLET | Freq: Every day | ORAL | Status: DC
  Administered 2018-01-26 – 2018-01-28 (×3): 112 ug via ORAL
  Filled 2018-01-25 (×3): qty 1

## 2018-01-25 MED ORDER — MACITENTAN 10 MG PO TABS: 10.0000 mg | ORAL_TABLET | Freq: Every day | ORAL | 11 refills | Status: DC

## 2018-01-25 MED ORDER — LORATADINE 10 MG PO TABS
10.0000 mg | ORAL_TABLET | Freq: Every day | ORAL | Status: DC
  Administered 2018-01-25 – 2018-01-28 (×4): 10 mg via ORAL
  Filled 2018-01-25 (×4): qty 1

## 2018-01-25 MED ORDER — VITAMIN B-12 1000 MCG PO TABS
1000.0000 ug | ORAL_TABLET | Freq: Every day | ORAL | Status: DC
  Administered 2018-01-25 – 2018-01-28 (×4): 1000 ug via ORAL
  Filled 2018-01-25 (×4): qty 1

## 2018-01-25 MED ORDER — DOXYCYCLINE HYCLATE 100 MG PO TABS
100.0000 mg | ORAL_TABLET | Freq: Two times a day (BID) | ORAL | Status: DC
  Administered 2018-01-25 – 2018-01-28 (×7): 100 mg via ORAL
  Filled 2018-01-25 (×7): qty 1

## 2018-01-25 MED ORDER — ASPIRIN EC 81 MG PO TBEC
81.0000 mg | DELAYED_RELEASE_TABLET | Freq: Every day | ORAL | Status: DC
  Administered 2018-01-25 – 2018-01-27 (×3): 81 mg via ORAL
  Filled 2018-01-25 (×4): qty 1

## 2018-01-25 NOTE — Progress Notes (Signed)
PROGRESS NOTE    Kimberly Irwin  INO:[id number] DOB: April 05, 1946 DOA: 01/24/2018 PCP: Kimberly Lukes, MD   Brief Narrative:  Kimberly Irwin is a 72 y.o. female with medical history significant of COPD, chronic respiratory failure on 4 L of oxygen continuous at home, chronic pain syndrome, hypertension, sarcoidosis comes in with 2 days of productive cough and shortness of breath.  She denies any lower extremity edema.  She denies any orthopnea or PND.  She denies any chest pain.  Patient had called and she was noted to have oxygen sats in the 80s despite her 4 L of oxygen. Referred for admission for acute on chronic respiratory failure likely secondary to COPD exacerbation.  He is being treated with duo nebs, steroids, antibiotics, supplemental oxygen via nasal cannula.  Did receive 1 dose of IV diuretics in the ED and will hold further doses at this time.  Assessment & Plan:   Principal Problem:   COPD with acute exacerbation ([place]) Active Problems:   Essential hypertension   Chronic pain syndrome   Chronic respiratory failure with hypoxia ([facility])  Acute on Chronic Respiratory Failure with Hypoxia likely 2/2 to COPD Exacerbation -May have component of Volume Overload as well as BNP was 556.9 -Patient wears 4 Liters of Home O2 Chronically and was found to be desaturating in the 80's -Chest x-ray on admission showed cardiomegaly with aortic atherosclerosis and chronic diffuse bronchitic change of the lungs -Chest x-ray this a.m. showed no acute abnormality noted -Continue with supplemental oxygen via nasal cannula and wean oxygen as tolerated to home requirements -Continuous pulse oximetry and maintain O2 saturation greater than 90% -Given IV Furosemide 40 mg times once in the ED -Continue with duo nebs 3 mL's nebulized every 6 hours and 2.5 mg of albuterol nebulized every 2 PRN for wheezing -Also added Arformoterol 15 mcg nebulized twice daily and Budesonide 0.5 mg nebs twice daily -Continue  with incentive spirometer, flutter valve, and added guaifenesin however patient is unable to tolerate guaifenesin -Repeat chest x-ray in the a.m.  COPD with Acute Exacerbation  -Continue with supplemental oxygen via nasal cannula and wean oxygen as tolerated to home requirements; on 5 Liters of O2 now -Continuous pulse oximetry and maintain O2 saturation greater than 90% -IV furosemide 40 mg times once in the ED -Continue with duo nebs 3 mL's nebulized every 6 hours and 2.5 mg of albuterol nebulized every 2 PRN for wheezing -Also added Arformoterol 15 mcg nebulized twice daily and Budesonide 0.5 mg nebs twice daily -Given Methylprednisolone 125 mg IV once in the ED and will continue 60 mg IV every 6 scheduled for now -Started patient started on Doxycycline 100 mg every 12 scheduled -Continue incentive spirometer and flutter valve -Check respiratory virus panel and placed on empiric droplet precautions -Repeat chest x-ray in the morning -Patient unable to tolerate guaifenesin 1200 mg p.o. twice daily as she states this causes her nausea  Essential Hypertension -Continue with Atenolol 50 mg p.o. Daily and 25 mg p.o. nightly  Head Tremor/Tremors of the Nervous System  -Continue with Atenolol 50 mg p.o. Daily and 25 mg p.o. nightly  Pulmonary Hypertension -Continue with Tadalafil 20 mg p.o. daily  Hyperlipidemia -Continue rosuvastatin 20 mg p.o. nightly as well as omega-3 acid ethyl esters 2 g p.o. daily  Hypothyroidism -Check TSH -Continue levothyroxine 112 micrograms p.o. daily before breakfast  GERD -Continue with Pantoprazole 40 mg p.o. daily  History of chronic low back pain and degenerative disc disease with L3-L4  facet arthropathy and stenosis and L4-L5 grade 1 spondylolisthesis with stenosis -Continue with Tramadol 100 mg p.o. every 6 PRN for moderate and severe pain  Hx of Sarcoidosis  -Treatment as Above -Continue to Monitor   CAD -C/w ASA 81 mg, Atenolol 25 mg po  qHS and 50 mg po Daily, and Rosuvastatin 20 mg po Daily   Renal Insuffiencey  -Mild. Further review shows Baseline Cr of 0.8-0.9 -BUN/Cr on Admission was 15/1.12 -Given IV Lasix yesterday -Continue to Monitor Carefully and Avoid Nephrotoxic medications  -Repeat CMP in AM   DVT prophylaxis: Enoxaparin 40 mg sq qHS Code Status: FULL CODE Family Communication: Discussed with daughter at bedside  Disposition Plan: Remain Inpatient for current workup and treatment   Consultants:   None   Procedures: None  Antimicrobials:  Anti-infectives (From admission, onward)   None     Subjective: MN states that her head tremor is gotten worse and she did not receive her medication morning.  No chest pain, lightheadedness or dizziness but does remain short of breath.  No other complaints or concerns at this time and is coughing and has a wet sounding cough but not able to bring up productive sputum  Objective: Vitals:   01/25/18 0116 01/25/18 0145 01/25/18 0510 01/25/18 0733  BP: (!) 138/93  121/76   Pulse: 77  67   Resp: (!) 23  19   Temp: 97.8 F (36.6 C)  97.8 F (36.6 C)   TempSrc: Oral  Oral   SpO2: 91% 92% 96% 97%  Weight:   76.4 kg (168 lb 6.9 oz)     Intake/Output Summary (Last 24 hours) at 01/25/2018 0820 Last data filed at 01/25/2018 0510 Gross per 24 hour  Intake -  Output 1200 ml  Net -1200 ml   Filed Weights   01/25/18 0510  Weight: 76.4 kg (168 lb 6.9 oz)   Examination: Physical Exam:  Constitutional: WN/WD elderly Caucasian female in NAD and appears calm  Eyes: Lids and conjunctivae normal, sclerae anicteric  ENMT: External Ears, Nose appear normal. Grossly normal hearing. Mucous membranes are moist.  Neck: Appears normal, supple, no cervical masses, normal ROM, no appreciable thyromegaly, no JVD Respiratory: Diminished to auscultation bilaterally, no wheezing, rales, rhonchi or crackles. Normal respiratory effort and patient is not tachypenic. No accessory  muscle use but is wearing supplemental O2 via [place].  Cardiovascular: RRR, no appreciable murmurs / rubs / gallops. S1 and S2 auscultated. Trace extremity edema.  Abdomen: Soft, non-tender, non-distended. No masses palpated. No appreciable hepatosplenomegaly. Bowel sounds positive x4.  GU: Deferred. Musculoskeletal: No clubbing / cyanosis of digits/nails. No joint deformity upper and lower extremities.  Skin: No rashes, lesions, ulcers on a limited skin evaluation. No induration; Warm and dry.  Neurologic: CN 2-12 grossly intact with no focal deficits. Romberg sign and cerebellar reflexes not assessed.  Psychiatric: Normal judgment and insight. Alert and oriented x 3. Normal mood and appropriate affect.   Data Reviewed: I have personally reviewed following labs and imaging studies  CBC: Recent Labs  Lab 01/24/18 2149 01/25/18 0547  WBC 10.4 6.7  NEUTROABS 8.7*  --   HGB 13.4 15.0  HCT 39.9 43.9  MCV 91.1 90.1  PLT 157 704   Basic Metabolic Panel: Recent Labs  Lab 01/24/18 2149 01/25/18 0547  NA 138 141  K 3.7 3.6  CL 109 104  CO2 22 27  GLUCOSE 127* 172*  BUN 14 15  CREATININE 1.12* 1.11*  CALCIUM 8.9 9.3  GFR: Estimated Creatinine Clearance: 44.2 mL/min (A) (by C-G formula based on SCr of 1.11 mg/dL (H)). Liver Function Tests: No results for input(s): AST, ALT, ALKPHOS, BILITOT, PROT, ALBUMIN in the last 168 hours. No results for input(s): LIPASE, AMYLASE in the last 168 hours. No results for input(s): AMMONIA in the last 168 hours. Coagulation Profile: No results for input(s): INR, PROTIME in the last 168 hours. Cardiac Enzymes: Recent Labs  Lab 01/24/18 2149  TROPONINI <0.03   BNP (last 3 results) No results for input(s): PROBNP in the last 8760 hours. HbA1C: No results for input(s): HGBA1C in the last 72 hours. CBG: No results for input(s): GLUCAP in the last 168 hours. Lipid Profile: No results for input(s): CHOL, HDL, LDLCALC, TRIG, CHOLHDL, LDLDIRECT in  the last 72 hours. Thyroid Function Tests: No results for input(s): TSH, T4TOTAL, FREET4, T3FREE, THYROIDAB in the last 72 hours. Anemia Panel: No results for input(s): VITAMINB12, FOLATE, FERRITIN, TIBC, IRON, RETICCTPCT in the last 72 hours. Sepsis Labs: No results for input(s): PROCALCITON, LATICACIDVEN in the last 168 hours.  No results found for this or any previous visit (from the past 240 hour(s)).   Radiology Studies: Dg Chest Port 1 View  Result Date: 01/24/2018 CLINICAL DATA:  Dyspnea and hypoxia with cough. EXAM: PORTABLE CHEST 1 VIEW COMPARISON:  12/09/2017 FINDINGS: Cardiomegaly with increased interstitial lung markings and peribronchial thickening compatible with chronic bronchitic change. No alveolar consolidation, CHF, effusion or pneumothorax. Aortic atherosclerosis is noted near the arch. No aneurysm. No acute osseous abnormality. IMPRESSION: Stable cardiomegaly with aortic atherosclerosis. Chronic diffuse bronchitic change of the lungs. Electronically Signed   By: Ashley Royalty M.D.   On: 01/24/2018 22:00   Scheduled Meds: . aspirin EC  81 mg Oral QHS  . atenolol  25 mg Oral QHS  . citalopram  10 mg Oral QHS  . enoxaparin (LOVENOX) injection  40 mg Subcutaneous QHS  . guaiFENesin  600 mg Oral BID  . ipratropium-albuterol  3 mL Nebulization Q6H  . sodium chloride flush  3 mL Intravenous Q12H   Continuous Infusions: . sodium chloride      LOS: 1 day   Kerney Elbe, DO [facility] Pager [phone]  If 7PM-7AM, please contact night-coverage [email or web address] Password [email or web address] 01/25/2018, 8:20 AM

## 2018-01-25 NOTE — Telephone Encounter (Signed)
Pt was approved for a grant to help with the assistance of her [employer] copay, rx sent to [facility]

## 2018-01-25 NOTE — Telephone Encounter (Signed)
Attempted to call patient today regarding BQ recommendations below. BQ advised that pt's daughter needs to advise hospitalist team to get a pulmonary consult for pt. I did not receive an answer at time of call. I have left a voicemail message for pt to return call. X1

## 2018-01-25 NOTE — ED Notes (Signed)
ED TO INPATIENT HANDOFF REPORT  Name/Age/Gender Kimberly Irwin 72 y.o. female  Code Status    Code Status Orders  (From admission, onward)        Start     Ordered   01/24/18 2310  Full code  Continuous     01/24/18 2312    Code Status History    Date Active Date Inactive Code Status Order ID Comments User Context   11/19/2017 1431 11/19/2017 1817 Full Code [id number]  Larey Dresser, MD Inpatient   09/14/2014 2008 09/15/2014 1613 Full Code [id number]  Charlie Pitter, MD Inpatient   08/29/2014 1109 08/30/2014 1330 Full Code [id number]  Charlie Pitter, MD Inpatient   06/28/2013 1318 06/29/2013 1703 Full Code [id number]  Babish, Lucille Passy, PA-C Inpatient      Home/SNF/Other Home  Chief Complaint Shortness of Breath  Level of Care/Admitting Diagnosis ED Disposition    ED Disposition Condition [facility] Area: [facility] [[id number]]  Level of Care: Med-Surg [16]  Diagnosis: COPD with acute exacerbation [facility]) [[id number]]  Admitting Physician: Phillips Grout [[id number]]  Attending Physician: Derrill Kay A [[id number]]  Estimated length of stay: past midnight tomorrow  Certification:: I certify this patient will need inpatient services for at least 2 midnights  PT Class (Do Not Modify): Inpatient [101]  PT Acc Code (Do Not Modify): Private [1]       Medical History Past Medical History:  Diagnosis Date  . Acute hip pain 10/26/2012  . Bronchitis 09/04/2016  . Bursitis of left hip 10/26/2012  . Cardiomegaly 07/13/2016  . Chronic low back pain   . Coronary artery calcification 07/03/2016  . DDD (degenerative disc disease)    L3-4 with facet arthropathy and stenosis  . Degenerative spondylolisthesis    L4-5 grade 1 with stenosis  . GERD (gastroesophageal reflux disease)   . Heart murmur    as an infant  . History of hiatal hernia   . Hyperlipidemia   . Hypertension   . Hypothyroidism   . Hypothyroidism 10/28/2015  . Hypoxia, sleep related  10/21/2016  . Pain in joint, lower leg 10/26/2012   left   . Pneumonia    as a child several times.   . Sarcoidosis   . SOB (shortness of breath) 04/03/2016  . Tremors of nervous system     Allergies Allergies  Allergen Reactions  . Lipitor [Atorvastatin] Swelling  . Penicillins Swelling    Has patient had a PCN reaction causing immediate rash, facial/tongue/throat swelling, SOB or lightheadedness with hypotension: Yes Has patient had a PCN reaction causing severe rash involving mucus membranes or skin necrosis: No Has patient had a PCN reaction that required hospitalization: No Has patient had a PCN reaction occurring within the last 10 years: No If all of the above answers are "NO", then may proceed with Cephalosporin use.   . Metronidazole Swelling  . Pregabalin Other (See Comments)    REACTION: Somnolence and dizziness  . Simvastatin Other (See Comments)    Leg pain    IV Location/Drains/Wounds Patient Lines/Drains/Airways Status   Active Line/Drains/Airways    Name:   Placement date:   Placement time:   Site:   Days:   Peripheral IV 01/24/18 Left;Posterior Hand   01/24/18    2116    Hand   1   Incision 06/28/13 Hip Left   06/28/13    0943     1672   Incision (Closed) 08/29/14 Flank Other (Comment)  08/29/14    0937     1245   Incision (Closed) 09/14/14 Back Other (Comment)   09/14/14    1704     1229          Labs/Imaging Results for orders placed or performed during the hospital encounter of 01/24/18 (from the past 48 hour(s))  CBC with Differential/Platelet     Status: Abnormal   Collection Time: 01/24/18  9:49 PM  Result Value Ref Range   WBC 10.4 4.0 - 10.5 K/uL   RBC 4.38 3.87 - 5.11 MIL/uL   Hemoglobin 13.4 12.0 - 15.0 g/dL   HCT 39.9 36.0 - 46.0 %   MCV 91.1 78.0 - 100.0 fL   MCH 30.6 26.0 - 34.0 pg   MCHC 33.6 30.0 - 36.0 g/dL   RDW 13.5 11.5 - 15.5 %   Platelets 157 150 - 400 K/uL   Neutrophils Relative % 84 %   Neutro Abs 8.7 (H) 1.7 - 7.7 K/uL    Lymphocytes Relative 11 %   Lymphs Abs 1.1 0.7 - 4.0 K/uL   Monocytes Relative 4 %   Monocytes Absolute 0.5 0.1 - 1.0 K/uL   Eosinophils Relative 1 %   Eosinophils Absolute 0.1 0.0 - 0.7 K/uL   Basophils Relative 0 %   Basophils Absolute 0.0 0.0 - 0.1 K/uL    Comment: Performed at [facility], [place] [address], [place], [place] [address]  Basic metabolic panel     Status: Abnormal   Collection Time: 01/24/18  9:49 PM  Result Value Ref Range   Sodium 138 135 - 145 mmol/L   Potassium 3.7 3.5 - 5.1 mmol/L   Chloride 109 101 - 111 mmol/L   CO2 22 22 - 32 mmol/L   Glucose, Bld 127 (H) 65 - 99 mg/dL   BUN 14 6 - 20 mg/dL   Creatinine, Ser 1.12 (H) 0.44 - 1.00 mg/dL   Calcium 8.9 8.9 - 10.3 mg/dL   GFR calc non Af Amer 48 (L) >60 mL/min   GFR calc Af Amer 55 (L) >60 mL/min    Comment: (NOTE) The eGFR has been calculated using the CKD EPI equation. This calculation has not been validated in all clinical situations. eGFR's persistently <60 mL/min signify possible Chronic Kidney Disease.    Anion gap 7 5 - 15    Comment: Performed at [facility], [place] [address], [place], [place] [address]  Brain natriuretic peptide     Status: Abnormal   Collection Time: 01/24/18  9:49 PM  Result Value Ref Range   B Natriuretic Peptide 566.9 (H) 0.0 - 100.0 pg/mL    Comment: Performed at [facility], [place] [address] [place], [place] [address]  Troponin I     Status: None   Collection Time: 01/24/18  9:49 PM  Result Value Ref Range   Troponin I <0.03 <0.03 ng/mL    Comment: Performed at [facility], [place] [address] [place], [place] [address]   Dg Chest Port 1 View  Result Date: 01/24/2018 CLINICAL DATA:  Dyspnea and hypoxia with cough. EXAM: PORTABLE CHEST 1 VIEW COMPARISON:  12/09/2017 FINDINGS: Cardiomegaly with increased interstitial lung markings and peribronchial thickening compatible with chronic bronchitic change. No  alveolar consolidation, CHF, effusion or pneumothorax. Aortic atherosclerosis is noted near the arch. No aneurysm. No acute osseous abnormality. IMPRESSION: Stable cardiomegaly with aortic atherosclerosis. Chronic diffuse bronchitic change of the lungs. Electronically Signed   By: Ashley Royalty  M.D.   On: 01/24/2018 22:00    Pending Labs Unresulted Labs (From admission, onward)   Start     Ordered   01/31/18 0500  Creatinine, serum  (enoxaparin (LOVENOX)    CrCl >/= 30 ml/min)  Weekly,   R    Comments:  while on enoxaparin therapy    01/24/18 2312   01/25/18 8675  Basic metabolic panel  Tomorrow morning,   R     01/24/18 2312   01/25/18 0500  CBC  Tomorrow morning,   R     01/24/18 2312   01/24/18 2310  CBC  (enoxaparin (LOVENOX)    CrCl >/= 30 ml/min)  Once,   R    Comments:  Baseline for enoxaparin therapy IF NOT ALREADY DRAWN.  Notify MD if PLT < 100 K.    01/24/18 2312   01/24/18 2310  Creatinine, serum  (enoxaparin (LOVENOX)    CrCl >/= 30 ml/min)  Once,   R    Comments:  Baseline for enoxaparin therapy IF NOT ALREADY DRAWN.    01/24/18 2312      Vitals/Pain Today's Vitals   01/24/18 2115 01/24/18 2116 01/24/18 2200 01/24/18 2215  BP: 140/79  135/79 135/73  Pulse: 65  71 89  Resp: (!) 22  (!) 25 (!) 31  SpO2: 98%  97% (!) 87%  PainSc:  0-No pain      Isolation Precautions No active isolations  Medications Medications  furosemide (LASIX) injection 40 mg (has no administration in time range)  enoxaparin (LOVENOX) injection 40 mg (has no administration in time range)  sodium chloride flush (NS) 0.9 % injection 3 mL (has no administration in time range)  sodium chloride flush (NS) 0.9 % injection 3 mL (has no administration in time range)  0.9 %  sodium chloride infusion (has no administration in time range)  albuterol (PROVENTIL) (2.5 MG/3ML) 0.083% nebulizer solution 2.5 mg (has no administration in time range)  albuterol (PROVENTIL) (2.5 MG/3ML) 0.083% nebulizer  solution 2.5 mg (has no administration in time range)  ipratropium (ATROVENT) nebulizer solution 0.5 mg (has no administration in time range)  guaiFENesin (MUCINEX) 12 hr tablet 600 mg (has no administration in time range)  aspirin EC tablet 81 mg (has no administration in time range)  atenolol (TENORMIN) tablet 25 mg (has no administration in time range)  citalopram (CELEXA) tablet 10 mg (has no administration in time range)  ipratropium (ATROVENT) nebulizer solution 0.5 mg (0.5 mg Nebulization Given 01/24/18 2150)  albuterol (PROVENTIL) (2.5 MG/3ML) 0.083% nebulizer solution 5 mg (5 mg Nebulization Given 01/24/18 2150)  methylPREDNISolone sodium succinate (SOLU-MEDROL) 125 mg/2 mL injection 125 mg (125 mg Intravenous Given 01/24/18 2152)    Mobility walks

## 2018-01-25 NOTE — Plan of Care (Signed)
  Problem: Activity: Goal: Risk for activity intolerance will decrease 01/25/2018 0446 by Renata Caprice, RN Outcome: Progressing 01/25/2018 0445 by Renata Caprice, RN Outcome: Progressing   Problem: Coping: Goal: Level of anxiety will decrease 01/25/2018 0446 by Renata Caprice, RN Outcome: Progressing 01/25/2018 0445 by Renata Caprice, RN Outcome: Progressing   Problem: Safety: Goal: Ability to remain free from injury will improve 01/25/2018 0446 by Renata Caprice, RN Outcome: Progressing 01/25/2018 0445 by Renata Caprice, RN Outcome: Progressing   Problem: Skin Integrity: Goal: Risk for impaired skin integrity will decrease 01/25/2018 0446 by Renata Caprice, RN Outcome: Progressing 01/25/2018 0445 by Renata Caprice, RN Outcome: Progressing   Problem: Education: Goal: Knowledge of disease or condition will improve 01/25/2018 0446 by Renata Caprice, RN Outcome: Progressing 01/25/2018 0445 by Renata Caprice, RN Outcome: Progressing Goal: Knowledge of the prescribed therapeutic regimen will improve 01/25/2018 0446 by Renata Caprice, RN Outcome: Progressing 01/25/2018 0445 by Renata Caprice, RN Outcome: Progressing   Problem: Respiratory: Goal: Ability to maintain a clear airway will improve 01/25/2018 0446 by Renata Caprice, RN Outcome: Progressing 01/25/2018 0445 by Renata Caprice, RN Outcome: Progressing Goal: Levels of oxygenation will improve 01/25/2018 0446 by Renata Caprice, RN Outcome: Progressing 01/25/2018 0445 by Renata Caprice, RN Outcome: Progressing Goal: Ability to maintain adequate ventilation will improve 01/25/2018 0446 by Renata Caprice, RN Outcome: Progressing 01/25/2018 0445 by Renata Caprice, RN Outcome: Progressing   Problem: Respiratory: Goal: Levels of oxygenation will improve 01/25/2018 0446 by Renata Caprice, RN Outcome: Progressing 01/25/2018 0445 by Renata Caprice, RN Outcome: Progressing   Problem: Respiratory: Goal: Ability to maintain adequate ventilation will improve 01/25/2018 0446 by Renata Caprice, RN Outcome: Progressing 01/25/2018 0445 by Renata Caprice, RN Outcome: Progressing

## 2018-01-25 NOTE — Telephone Encounter (Signed)
Called and spoke with pt's daughter [name]. She advised pt was admitted to [facility] this morning at 2am for COPD with exacerbation. Pt has increase SOB on O2 at 4 liters, sats dropped to low 80's %.  Pt's daughter would like a message sent to BQ advising him that pt is in the hospital.   Routing message for BQ to review.

## 2018-01-25 NOTE — Telephone Encounter (Signed)
Noted Please have her ask the hospitalist team for a pulmonary consult.

## 2018-01-26 ENCOUNTER — Inpatient Hospital Stay (HOSPITAL_COMMUNITY): Payer: Medicare Other

## 2018-01-26 LAB — CBC WITH DIFFERENTIAL/PLATELET
BASOS ABS: 0 10*3/uL (ref 0.0–0.1)
Basophils Relative: 0 %
EOS ABS: 0 10*3/uL (ref 0.0–0.7)
EOS PCT: 0 %
HEMATOCRIT: 41.3 % (ref 36.0–46.0)
Hemoglobin: 13.6 g/dL (ref 12.0–15.0)
Lymphocytes Relative: 8 %
Lymphs Abs: 1 10*3/uL (ref 0.7–4.0)
MCH: 29.9 pg (ref 26.0–34.0)
MCHC: 32.9 g/dL (ref 30.0–36.0)
MCV: 90.8 fL (ref 78.0–100.0)
MONO ABS: 0.4 10*3/uL (ref 0.1–1.0)
MONOS PCT: 3 %
NEUTROS ABS: 11.1 10*3/uL — AB (ref 1.7–7.7)
Neutrophils Relative %: 89 %
PLATELETS: 182 10*3/uL (ref 150–400)
RBC: 4.55 MIL/uL (ref 3.87–5.11)
RDW: 13.7 % (ref 11.5–15.5)
WBC: 12.5 10*3/uL — ABNORMAL HIGH (ref 4.0–10.5)

## 2018-01-26 LAB — PHOSPHORUS: Phosphorus: 3.6 mg/dL (ref 2.5–4.6)

## 2018-01-26 LAB — COMPREHENSIVE METABOLIC PANEL
ALT: 14 U/L (ref 0–44)
AST: 19 U/L (ref 15–41)
Albumin: 3.5 g/dL (ref 3.5–5.0)
Alkaline Phosphatase: 39 U/L (ref 38–126)
Anion gap: 9 (ref 5–15)
BILIRUBIN TOTAL: 0.6 mg/dL (ref 0.3–1.2)
BUN: 23 mg/dL (ref 8–23)
CHLORIDE: 104 mmol/L (ref 98–111)
CO2: 28 mmol/L (ref 22–32)
Calcium: 9.4 mg/dL (ref 8.9–10.3)
Creatinine, Ser: 1 mg/dL (ref 0.44–1.00)
GFR calc Af Amer: >60 mL/min (ref >60)
GFR calc non Af Amer: 55 mL/min — ABNORMAL LOW (ref >60)
Glucose, Bld: 153 mg/dL — ABNORMAL HIGH (ref 70–99)
POTASSIUM: 3.7 mmol/L (ref 3.5–5.1)
SODIUM: 141 mmol/L (ref 135–145)
Total Protein: 6.8 g/dL (ref 6.5–8.1)

## 2018-01-26 LAB — MAGNESIUM: Magnesium: 1.9 mg/dL (ref 1.7–2.4)

## 2018-01-26 MED ORDER — GI COCKTAIL ~~LOC~~
30.0000 mL | Freq: Once | ORAL | Status: AC
  Administered 2018-01-26: 30 mL via ORAL
  Filled 2018-01-26: qty 30

## 2018-01-26 MED ORDER — METHYLPREDNISOLONE SODIUM SUCC 125 MG IJ SOLR
60.0000 mg | Freq: Two times a day (BID) | INTRAMUSCULAR | Status: DC
  Administered 2018-01-26 – 2018-01-28 (×4): 60 mg via INTRAVENOUS
  Filled 2018-01-26 (×4): qty 2

## 2018-01-26 NOTE — Consult Note (Signed)
PULMONARY / CRITICAL CARE MEDICINE   Name: Kimberly Irwin MRN: [id number] DOB: August 03, 1946    ADMISSION DATE:  01/24/2018 CONSULTATION DATE:  01/26/2018  REFERRING MD:  Dr. Alfredia Ferguson, [facility]  CHIEF COMPLAINT:  Short of breath  HISTORY OF PRESENT ILLNESS:   72 yo female former smoker presented with dyspnea, cough, hypoxia.  She was started on therapy for COPD exacerbation and hypervolemia.  Her symptoms started about 3 days prior to admission.  She has been getting dry cough and wheezing.  No sinus pressure, ear pain, fever, sore throat, chest pain, abdominal pain, diarrhea, skin rash, or leg swelling.  No recent sick exposures.  She is normally on 4 liters oxygen 24/7 at home.  She has hx of Emphysema, Sarcoidosis, and pulmonary hypertension.  She is followed by Dr. Lake Bells and Dr. Aundra Dubin.  She reports only taking adcirca at home.  She was to be started on opsumit and was only approved for this around the time of her admission to hospital.  PAST MEDICAL HISTORY :  She  has a past medical history of Acute hip pain (10/26/2012), Bronchitis (09/04/2016), Bursitis of left hip (10/26/2012), Cardiomegaly (07/13/2016), Chronic low back pain, Coronary artery calcification (07/03/2016), DDD (degenerative disc disease), Degenerative spondylolisthesis, GERD (gastroesophageal reflux disease), Heart murmur, History of hiatal hernia, Hyperlipidemia, Hypertension, Hypothyroidism, Hypothyroidism (10/28/2015), Hypoxia, sleep related (10/21/2016), Pain in joint, lower leg (10/26/2012), Pneumonia, Sarcoidosis, SOB (shortness of breath) (04/03/2016), and Tremors of nervous system.  PAST SURGICAL HISTORY: She  has a past surgical history that includes Carpal tunnel release (Bilateral); Thyroidectomy; Back surgery (01/15/09); Back surgery (01/15/09); Back surgery (01/15/09); Joint replacement (09/13/09); Total hip arthroplasty (Left, 06/28/2013); Eye surgery; Colonoscopy; Esophagogastroduodenoscopy; Cardiac catheterization; Anterior  lat lumbar fusion (Left, 08/29/2014); Kyphoplasty (N/A, 09/14/2014); and RIGHT HEART CATH (N/A, 11/19/2017).  Allergies  Allergen Reactions  . Lipitor [Atorvastatin] Swelling  . Penicillins Swelling    Has patient had a PCN reaction causing immediate rash, facial/tongue/throat swelling, SOB or lightheadedness with hypotension: Yes Has patient had a PCN reaction causing severe rash involving mucus membranes or skin necrosis: No Has patient had a PCN reaction that required hospitalization: No Has patient had a PCN reaction occurring within the last 10 years: No If all of the above answers are "NO", then may proceed with Cephalosporin use.   . Metronidazole Swelling  . Pregabalin Other (See Comments)    REACTION: Somnolence and dizziness  . Simvastatin Other (See Comments)    Leg pain    No current facility-administered medications on file prior to encounter.    Current Outpatient Medications on File Prior to Encounter  Medication Sig  . acetaminophen (TYLENOL) 500 MG tablet Take 1,000 mg by mouth every 6 (six) hours as needed for moderate pain.  Marland Kitchen albuterol (PROVENTIL HFA;VENTOLIN HFA) 108 (90 Base) MCG/ACT inhaler Inhale 2 puffs into the lungs every 6 (six) hours as needed for wheezing or shortness of breath.  Marland Kitchen albuterol (PROVENTIL) (2.5 MG/3ML) 0.083% nebulizer solution Take 3 mLs (2.5 mg total) by nebulization every 6 (six) hours as needed for wheezing or shortness of breath.  Marland Kitchen aspirin EC 81 MG tablet Take 81 mg by mouth at bedtime.   Marland Kitchen atenolol (TENORMIN) 25 MG tablet Take 25 mg by mouth at bedtime.  Marland Kitchen atenolol (TENORMIN) 50 MG tablet TAKE 1 TABLET (50 MG TOTAL) BY MOUTH DAILY. (Patient taking differently: Take 50 mg by mouth every morning. )  . BESIVANCE 0.6 % SUSP Place 1 drop into the left eye See admin instructions.  INSTILL 1 DROP INTO LEFT 4 TIMES DAILY x 2 DAYS AFTER EYE INJECTION  . budesonide-formoterol (SYMBICORT) 160-4.5 MCG/ACT inhaler Inhale 2 puffs into the lungs 2 (two)  times daily.  . calcium carbonate (OSCAL) 1500 (600 Ca) MG TABS tablet Take 1,200 mg by mouth daily with breakfast.  . Cholecalciferol (VITAMIN D) 2000 UNITS tablet Take 2,000 Units by mouth every evening.   . Cinnamon 500 MG capsule Take 2,000 mg by mouth daily.    . citalopram (CELEXA) 10 MG tablet Take 1 tablet (10 mg total) by mouth at bedtime.  . Coenzyme Q10 200 MG capsule Take 200 mg by mouth at bedtime.   . fexofenadine (ALLEGRA) 180 MG tablet Take 180 mg by mouth daily.  Marland Kitchen gabapentin (NEURONTIN) 300 MG capsule TAKE 1 CAPSULE (300 MG TOTAL) BY MOUTH AT BEDTIME.  Marland Kitchen Glucosamine HCl (GLUCOSAMINE PO) Take 1 tablet by mouth every evening.   Marland Kitchen ketoconazole (NIZORAL) 2 % shampoo Apply 1 application topically 2 (two) times a week. (Patient taking differently: Apply 1 application topically every 14 (fourteen) days. ONCE EVERY 2 WEEKS PER PATIENT)  . levothyroxine (SYNTHROID, LEVOTHROID) 112 MCG tablet TAKE 1 TABLET (112 MCG TOTAL) BY MOUTH DAILY BEFORE BREAKFAST.  Marland Kitchen Omega-3 Fatty Acids (FISH OIL) 1200 MG CAPS Take 1,200 mg by mouth daily.   Marland Kitchen omeprazole (PRILOSEC) 40 MG capsule TAKE 1 CAPSULE (40 MG TOTAL) BY MOUTH DAILY.  . rosuvastatin (CRESTOR) 20 MG tablet TAKE 1 TABLET (20 MG TOTAL) BY MOUTH DAILY. (Patient taking differently: Take 20 mg by mouth at bedtime. )  . tadalafil, PAH, (ADCIRCA) 20 MG tablet Take 1 tablet (20 mg total) by mouth daily.  . vitamin B-12 (CYANOCOBALAMIN) 1000 MCG tablet Take 1,000 mcg by mouth daily.  Marland Kitchen ambrisentan (LETAIRIS) 5 MG tablet Take 1 tablet (5 mg total) by mouth daily. (Patient not taking: Reported on 01/24/2018)  . mometasone (NASONEX) 50 MCG/ACT nasal spray Place 2 sprays daily as needed into the nose. (Patient not taking: Reported on 01/24/2018)  . mupirocin ointment (BACTROBAN) 2 % Apply twice daily to bridge of the nose. (Patient not taking: Reported on 01/24/2018)  . mupirocin ointment (BACTROBAN) 2 % Place 1 application into the nose 2 (two) times daily.  (Patient not taking: Reported on 01/24/2018)  . traMADol (ULTRAM) 50 MG tablet Take 2 tablets (100 mg total) by mouth every 6 (six) hours as needed for moderate pain or severe pain. (Patient not taking: Reported on 01/24/2018)    FAMILY HISTORY:  Her indicated that her mother is deceased. She indicated that her father is deceased. She indicated that both of her sisters are alive. She indicated that the status of her maternal grandmother is unknown. She indicated that both of her daughters are alive.   SOCIAL HISTORY: She  reports that she quit smoking about 23 years ago. Her smoking use included cigarettes. She has a 16.00 pack-year smoking history. She has never used smokeless tobacco. She reports that she does not drink alcohol or use drugs.  REVIEW OF SYSTEMS:   12 point ROS negative except above  SUBJECTIVE:   VITAL SIGNS: BP 138/75   Pulse 64   Temp 97.8 F (36.6 C) (Oral)   Resp 18   Ht _0  (1.6 m)   Wt 168 lb 6.9 oz (76.4 kg)   SpO2 95%   BMI 29.84 kg/m   INTAKE / OUTPUT: I/O last 3 completed shifts: In: 360 [P.O.:360] Out: 1800 [Urine:1800]  PHYSICAL EXAMINATION:  General - pleasant  Eyes - pupils reactive ENT - no sinus tenderness, no oral exudate, no LAN Cardiac - regular, no murmur Chest - b/l expiratory wheezing Abd - soft, non tender Ext - no edema Skin - no rashes Neuro - normal strength Psych - normal mood   LABS:  BMET Recent Labs  Lab 01/24/18 2149 01/25/18 0547 01/26/18 0626  NA 138 141 141  K 3.7 3.6 3.7  CL 109 104 104  CO2 _0 BUN _1 CREATININE 1.12* 1.11* 1.00  GLUCOSE 127* 172* 153*    Electrolytes Recent Labs  Lab 01/24/18 2149 01/25/18 0547 01/26/18 0626  CALCIUM 8.9 9.3 9.4  MG  --   --  1.9  PHOS  --   --  3.6    CBC Recent Labs  Lab 01/24/18 2149 01/25/18 0547 01/26/18 0626  WBC 10.4 6.7 12.5*  HGB 13.4 15.0 13.6  HCT 39.9 43.9 41.3  PLT 157 169 182    Coag's No results for input(s): APTT,  INR in the last 168 hours.  Sepsis Markers No results for input(s): LATICACIDVEN, PROCALCITON, O2SATVEN in the last 168 hours.  ABG No results for input(s): PHART, PCO2ART, PO2ART in the last 168 hours.  Liver Enzymes Recent Labs  Lab 01/26/18 0626  AST 19  ALT 14  ALKPHOS 39  BILITOT 0.6  ALBUMIN 3.5    Cardiac Enzymes Recent Labs  Lab 01/24/18 2149  TROPONINI <0.03    Glucose No results for input(s): GLUCAP in the last 168 hours.  Imaging Dg Chest Port 1 View  Result Date: 01/26/2018 CLINICAL DATA:  Shortness of breath EXAM: PORTABLE CHEST 1 VIEW COMPARISON:  01/25/2018 FINDINGS: Cardiac shadow remains enlarged. The lungs are well aerated bilaterally. No focal infiltrate or sizable effusion is seen. No bony abnormality is noted. IMPRESSION: No active disease. Electronically Signed   By: Inez Catalina M.D.   On: 01/26/2018 08:00   Dg Chest Port 1 View  Result Date: 01/25/2018 CLINICAL DATA:  Shortness of breath and hypoxia EXAM: PORTABLE CHEST 1 VIEW COMPARISON:  01/24/2018 FINDINGS: Cardiac shadow is stable. Aortic calcifications are again seen. The lungs are well aerated bilaterally. No focal infiltrate or sizable effusion is seen. No bony abnormality is seen. IMPRESSION: No acute abnormality noted. Electronically Signed   By: Inez Catalina M.D.   On: 01/25/2018 09:56     STUDIES:  Mediastinoscopy 06/20/02 >> Non necrotizing granulomas A1AT 10/01/16 >> 118, MM PFT 10/19/17 >> FEV1 1.83 (84%), FEV1% 75, TLC 3.83 (75%), DLCO 25% HRCT 10/20/17 >> atherosclerosis, mild air trapping, septal thickening RLL, bronchial wall thickening, moderate centrilobular and mild paraseptal emphysema, 6 mm RUL nodule, 3 mm RUL nodule RHC 11/19/17 >> RA mean 2, RV 66/5, PA 66/16/34, PCWP 3, CI 2.04, PVR 8.3 WU Serology 12/04/17 >> ANA, SCL 70, anti centromere, RF all negative V/Q scan 12/09/17 >> normal perfusion scan Echo 01/11/18 >> EF 55 to 60%, mild LVH, grade 1 DD, mod RV systolic dysfx, PAS  83 mmHg  CULTURES: HIV 12/04/17 >> negative Respiratory viral panel 01/25/18 >> negative  ANTIBIOTICS: Doxycycline 6/24 >>   DISCUSSION: She has worsening dyspnea, cough, hypoxia, and wheeze likely from COPD exacerbation.  She has hx of WHO group 1, 2, 3, 5 pulmonary hypertension.  ASSESSMENT / PLAN:  Acute on chronic hypoxic respiratory failure. - oxygen to keep SpO2 90 to 95%  COPD exacerbation. - continue solumedrol - scheduled BDs - day 2 of doxycycline  Pulmonary hypertension. - continue  adcirca - will f/u with CHF team to arrange for starting opsumit  - probably warrants outpt sleep study after she recovers from current COPD exacerbation  DVT prophylaxis - lovenox SUP - protonix Nutrition - 2gm Na Goals of care - full code  Chesley Mires, MD [place] 01/26/2018, 10:10 AM

## 2018-01-26 NOTE — Progress Notes (Addendum)
PROGRESS NOTE    Kimberly Irwin  XIP:[id number] DOB: Sep 10, 1945 DOA: 01/24/2018 PCP: Mosie Lukes, MD   Brief Narrative:  Kimberly Irwin is a 72 y.o. female with medical history significant of COPD, chronic respiratory failure on 4 L of oxygen continuous at home, chronic pain syndrome, hypertension, sarcoidosis comes in with 2 days of productive cough and shortness of breath.  She denies any lower extremity edema.  She denies any orthopnea or PND.  She denies any chest pain.  Patient had called and she was noted to have oxygen sats in the 80s despite her 4 L of oxygen. Referred for admission for acute on chronic respiratory failure likely secondary to COPD exacerbation.  He is being treated with duo nebs, steroids, antibiotics, supplemental oxygen via nasal cannula.  Did receive 1 dose of IV diuretics in the ED and will hold further doses at this time.  Assessment & Plan:   Principal Problem:   COPD with acute exacerbation ([place]) Active Problems:   Essential hypertension   Chronic pain syndrome   Chronic respiratory failure with hypoxia (HCC)  Acute on Chronic Respiratory Failure with Hypoxia likely 2/2 to COPD Exacerbation -May have component of Volume Overload as well as BNP was 556.9 -Patient wears 4 Liters of Home O2 Chronically and was found to be desaturating in the 80's -Chest x-ray on admission showed cardiomegaly with aortic atherosclerosis and chronic diffuse bronchitic change of the lungs -Continue with supplemental oxygen via nasal cannula and wean oxygen as tolerated to home requirements -Continuous pulse oximetry and maintain O2 saturation greater than 90% and Pulmonary recommending keeping SpO2 90-95% -Given IV Furosemide 40 mg times once in the ED -Continue with duo nebs 3 mL's nebulized every 6 hours and 2.5 mg of albuterol nebulized every 2 PRN for wheezing -Also added Arformoterol 15 mcg nebulized twice daily and Budesonide 0.5 mg nebs twice daily and will continue    -Continue with incentive spirometer, flutter valve, and added guaifenesin however patient is unable to tolerate guaifenesin -Respiratory Virus Panel Negative  -Repeat chest x-ray this AM showed no Active Disease  -Repeat CXR in AM  -Pulmonary Consulted for further evaluation and recommendations (Patient's Daughter called Dr. Anastasia Pall office who recommended TRH place Consult for Pulmonary)   COPD with Acute Exacerbation  -Continue with supplemental oxygen via nasal cannula and wean oxygen as tolerated to home requirements; on 5 Liters of O2 now -Continuous pulse oximetry and maintain O2 saturation greater than 90% and Pulmonary recommending keeping SpO2 90-95% -IV furosemide 40 mg times once in the ED -Continue with duo nebs 3 mL's nebulized every 6 hours and 2.5 mg of albuterol nebulized every 2 PRN for wheezing -Also added Arformoterol 15 mcg nebulized twice daily and Budesonide 0.5 mg nebs twice daily -Given Methylprednisolone 125 mg IV once in the ED and started on 60 mg q6h scheduled but will decrease to 60 mg IV BID -Started patient started on Doxycycline 100 mg every 12 scheduled -Continue incentive spirometer and flutter valve -Checked respiratory virus panel and placed on empiric droplet precautions but was negative so stopped  -Repeat CXR in the morning -Patient unable to tolerate guaifenesin 1200 mg p.o. twice daily as she states this causes her nausea  Essential Hypertension -Continue with Atenolol 50 mg p.o. Daily and 25 mg p.o. nightly  Head Tremor/Tremors of the Nervous System  -Continue with Atenolol 50 mg p.o. Daily and 25 mg p.o. nightly  Pulmonary Hypertension -Has a Hx of WHO Group 1, 2,  3, 5 pHTN -Continue with Tadalafil 20 mg p.o. Daily -Pulmonary will follow up with CHF Team Dr. [name] about starting Opsumit  -Per Pulmonary will likely need an outpatient sleep study after she recovers from this current COPD exacerbation  Hyperlipidemia -Continue  Rosuvastatin 20 mg p.o. nightly as well as omega-3 acid ethyl esters 2 g p.o. daily  Hypothyroidism -Check TSH in AM  -Continue Levothyroxine 112 micrograms p.o. daily before breakfast  GERD -Continue with Pantoprazole 40 mg p.o. daily  History of chronic low back pain and degenerative disc disease with L3-L4 facet arthropathy and stenosis and L4-L5 grade 1 spondylolisthesis with stenosis -Continue with Tramadol 100 mg p.o. every 6 PRN for moderate and severe pain  Hx of Sarcoidosis  -Treatment as Above -Continue to Monitor  -Pulmonary Following   CAD -C/w ASA 81 mg, Atenolol 25 mg po qHS and 50 mg po Daily, and Rosuvastatin 20 mg po Daily   Renal Insuffiencey, improving  -Mild. Further review shows Baseline Cr of 0.8-0.9 -BUN/Cr on Admission was 15/1.12 and is now 23/1.00 -Given IV Lasix yesterday -Continue to Monitor Carefully and Avoid Nephrotoxic medications  -Repeat CMP in AM   DVT prophylaxis: Enoxaparin 40 mg sq qHS Code Status: FULL CODE Family Communication: Discussed with daughter at bedside  Disposition Plan: Remain Inpatient for current workup and treatment   Consultants:   Pulmonary Dr. Chesley Mires    Procedures: None  Antimicrobials:  Anti-infectives (From admission, onward)   Start     Dose/Rate Route Frequency Ordered Stop   01/25/18 1000  doxycycline (VIBRA-TABS) tablet 100 mg     100 mg Oral Every 12 hours 01/25/18 0917       Subjective: Seen and examined at bedside and states her breathing is slightly better.  No chest pain, nausea or vomiting.  Was wearing the mask continuously and had it removed to eat.  No more wheezing today.  Denied any other concerns or complaints.  Objective: Vitals:   01/26/18 0832 01/26/18 0854 01/26/18 1359 01/26/18 1458  BP: 138/75  128/79   Pulse: 62 64 70 64  Resp: _0 Temp: 97.8 F (36.6 C)  97.8 F (36.6 C)   TempSrc: Oral  Oral   SpO2:  95% 92% 95%  Weight: 76.4 kg (168 lb 6.9 oz)     Height: _1   (1.6 m)       Intake/Output Summary (Last 24 hours) at 01/26/2018 1500 Last data filed at 01/26/2018 0954 Gross per 24 hour  Intake 123 ml  Output 600 ml  Net -477 ml   Filed Weights   01/25/18 0510 01/26/18 0832  Weight: 76.4 kg (168 lb 6.9 oz) 76.4 kg (168 lb 6.9 oz)   Examination: Physical Exam:  Constitutional: Well-nourished, well-developed elderly Caucasian female currently no acute distress appears calm and about to eat breakfast Eyes: Sclera anicteric.  Lids and conjunctive are normal ENMT: External ears and nose appear normal.  Grossly normal hearing.  Mucous members are moist Neck: Appears supple with no JVD Respiratory: Diminished to auscultation bilaterally with no appreciable wheezing, rales, rhonchi.  Sounds fairly clear today and respiratory effort is normal and patient not tachypneic however she is wearing supplemental oxygen via nasal cannula Cardiovascular: Regular rate and rhythm.  No appreciable murmurs rubs or gallops.  Has mild lower extremity edema Abdomen: Soft, nontender, nondistended.  Bowel sounds present all 4 quadrants GU: Deferred Musculoskeletal: No clubbing cyanosis noted.  No joint deformities noted Skin: No appreciable  rashes or lesions on limited skin evaluation. Neurologic: Cranial nerves II through XII grossly intact no appreciable focal deficits.  Has very pronounced head tremors Psychiatric: Normal judgment and insight.  Patient is awake alert and oriented x3.  Data Reviewed: I have personally reviewed following labs and imaging studies  CBC: Recent Labs  Lab 01/24/18 2149 01/25/18 0547 01/26/18 0626  WBC 10.4 6.7 12.5*  NEUTROABS 8.7*  --  11.1*  HGB 13.4 15.0 13.6  HCT 39.9 43.9 41.3  MCV 91.1 90.1 90.8  PLT 157 169 979   Basic Metabolic Panel: Recent Labs  Lab 01/24/18 2149 01/25/18 0547 01/26/18 0626  NA 138 141 141  K 3.7 3.6 3.7  CL 109 104 104  CO2 _0 GLUCOSE 127* 172* 153*  BUN _1 CREATININE 1.12*  1.11* 1.00  CALCIUM 8.9 9.3 9.4  MG  --   --  1.9  PHOS  --   --  3.6   GFR: Estimated Creatinine Clearance: 49 mL/min (by C-G formula based on SCr of 1 mg/dL). Liver Function Tests: Recent Labs  Lab 01/26/18 0626  AST 19  ALT 14  ALKPHOS 39  BILITOT 0.6  PROT 6.8  ALBUMIN 3.5   No results for input(s): LIPASE, AMYLASE in the last 168 hours. No results for input(s): AMMONIA in the last 168 hours. Coagulation Profile: No results for input(s): INR, PROTIME in the last 168 hours. Cardiac Enzymes: Recent Labs  Lab 01/24/18 2149  TROPONINI <0.03   BNP (last 3 results) No results for input(s): PROBNP in the last 8760 hours. HbA1C: No results for input(s): HGBA1C in the last 72 hours. CBG: No results for input(s): GLUCAP in the last 168 hours. Lipid Profile: No results for input(s): CHOL, HDL, LDLCALC, TRIG, CHOLHDL, LDLDIRECT in the last 72 hours. Thyroid Function Tests: No results for input(s): TSH, T4TOTAL, FREET4, T3FREE, THYROIDAB in the last 72 hours. Anemia Panel: No results for input(s): VITAMINB12, FOLATE, FERRITIN, TIBC, IRON, RETICCTPCT in the last 72 hours. Sepsis Labs: No results for input(s): PROCALCITON, LATICACIDVEN in the last 168 hours.  Recent Results (from the past 240 hour(s))  Respiratory Panel by PCR     Status: None   Collection Time: 01/25/18 11:53 AM  Result Value Ref Range Status   Adenovirus NOT DETECTED NOT DETECTED Final   Coronavirus 229E NOT DETECTED NOT DETECTED Final   Coronavirus HKU1 NOT DETECTED NOT DETECTED Final   Coronavirus NL63 NOT DETECTED NOT DETECTED Final   Coronavirus OC43 NOT DETECTED NOT DETECTED Final   Metapneumovirus NOT DETECTED NOT DETECTED Final   Rhinovirus / Enterovirus NOT DETECTED NOT DETECTED Final   Influenza A NOT DETECTED NOT DETECTED Final   Influenza B NOT DETECTED NOT DETECTED Final   Parainfluenza Virus 1 NOT DETECTED NOT DETECTED Final   Parainfluenza Virus 2 NOT DETECTED NOT DETECTED Final    Parainfluenza Virus 3 NOT DETECTED NOT DETECTED Final   Parainfluenza Virus 4 NOT DETECTED NOT DETECTED Final   Respiratory Syncytial Virus NOT DETECTED NOT DETECTED Final   Bordetella pertussis NOT DETECTED NOT DETECTED Final   Chlamydophila pneumoniae NOT DETECTED NOT DETECTED Final   Mycoplasma pneumoniae NOT DETECTED NOT DETECTED Final    Comment: Performed at [facility], [address], [place], [place] [address]    Radiology Studies: Dg Chest Port 1 View  Result Date: 01/26/2018 CLINICAL DATA:  Shortness of breath EXAM: PORTABLE CHEST 1 VIEW COMPARISON:  01/25/2018 FINDINGS: Cardiac shadow remains  enlarged. The lungs are well aerated bilaterally. No focal infiltrate or sizable effusion is seen. No bony abnormality is noted. IMPRESSION: No active disease. Electronically Signed   By: Inez Catalina M.D.   On: 01/26/2018 08:00   Dg Chest Port 1 View  Result Date: 01/25/2018 CLINICAL DATA:  Shortness of breath and hypoxia EXAM: PORTABLE CHEST 1 VIEW COMPARISON:  01/24/2018 FINDINGS: Cardiac shadow is stable. Aortic calcifications are again seen. The lungs are well aerated bilaterally. No focal infiltrate or sizable effusion is seen. No bony abnormality is seen. IMPRESSION: No acute abnormality noted. Electronically Signed   By: Inez Catalina M.D.   On: 01/25/2018 09:56   Dg Chest Port 1 View  Result Date: 01/24/2018 CLINICAL DATA:  Dyspnea and hypoxia with cough. EXAM: PORTABLE CHEST 1 VIEW COMPARISON:  12/09/2017 FINDINGS: Cardiomegaly with increased interstitial lung markings and peribronchial thickening compatible with chronic bronchitic change. No alveolar consolidation, CHF, effusion or pneumothorax. Aortic atherosclerosis is noted near the arch. No aneurysm. No acute osseous abnormality. IMPRESSION: Stable cardiomegaly with aortic atherosclerosis. Chronic diffuse bronchitic change of the lungs. Electronically Signed   By: Ashley Royalty M.D.   On: 01/24/2018 22:00   Scheduled Meds: .  arformoterol  15 mcg Nebulization BID  . aspirin EC  81 mg Oral QHS  . atenolol  25 mg Oral QHS  . atenolol  50 mg Oral Daily  . budesonide (PULMICORT) nebulizer solution  0.5 mg Nebulization BID  . cholecalciferol  2,000 Units Oral QPM  . citalopram  10 mg Oral QHS  . doxycycline  100 mg Oral Q12H  . enoxaparin (LOVENOX) injection  40 mg Subcutaneous QHS  . gabapentin  300 mg Oral QHS  . guaiFENesin  1,200 mg Oral BID  . ipratropium-albuterol  3 mL Nebulization TID  . levothyroxine  112 mcg Oral QAC breakfast  . loratadine  10 mg Oral Daily  . methylPREDNISolone (SOLU-MEDROL) injection  60 mg Intravenous Q12H  . omega-3 acid ethyl esters  2 g Oral Daily  . pantoprazole  40 mg Oral Daily  . rosuvastatin  20 mg Oral QHS  . sodium chloride flush  3 mL Intravenous Q12H  . tadalafil  20 mg Oral Daily  . vitamin B-12  1,000 mcg Oral Daily   Continuous Infusions: . sodium chloride      LOS: 2 days   Kerney Elbe, DO [facility] Pager [phone]  If 7PM-7AM, please contact night-coverage [email or web address] Password [email or web address] 01/26/2018, 3:00 PM

## 2018-01-26 NOTE — Telephone Encounter (Signed)
Called and spoke with pt's daughter-Laura in regards to BQ recommendations. [name] advised nurse this morning at [facility] for pulmonary consult request is needed for pt. Pulmonary Consult will be initiated today   [name] was also checking in on paperwork regarding the opsumit instead of letarisis medication.  Advised her it looks like Dr. Aundra Dubin office is handling the paperwork for Opsumit. [name] stated that she will contact someone with Dr. Aundra Dubin office today for update.  Routing message to Burman Nieves to follow up at later date.

## 2018-01-27 ENCOUNTER — Ambulatory Visit (HOSPITAL_COMMUNITY): Payer: Medicare Other

## 2018-01-27 ENCOUNTER — Encounter (HOSPITAL_COMMUNITY): Payer: Self-pay | Admitting: Pulmonary Disease

## 2018-01-27 ENCOUNTER — Inpatient Hospital Stay (HOSPITAL_COMMUNITY): Payer: Medicare Other

## 2018-01-27 LAB — CBC WITH DIFFERENTIAL/PLATELET
BASOS ABS: 0 10*3/uL (ref 0.0–0.1)
Basophils Relative: 0 %
EOS ABS: 0 10*3/uL (ref 0.0–0.7)
EOS PCT: 0 %
HCT: 42 % (ref 36.0–46.0)
HEMOGLOBIN: 13.7 g/dL (ref 12.0–15.0)
LYMPHS PCT: 9 %
Lymphs Abs: 1.2 10*3/uL (ref 0.7–4.0)
MCH: 30.1 pg (ref 26.0–34.0)
MCHC: 32.6 g/dL (ref 30.0–36.0)
MCV: 92.3 fL (ref 78.0–100.0)
Monocytes Absolute: 1.2 10*3/uL — ABNORMAL HIGH (ref 0.1–1.0)
Monocytes Relative: 9 %
NEUTROS PCT: 82 %
Neutro Abs: 11.6 10*3/uL — ABNORMAL HIGH (ref 1.7–7.7)
PLATELETS: 205 10*3/uL (ref 150–400)
RBC: 4.55 MIL/uL (ref 3.87–5.11)
RDW: 13.9 % (ref 11.5–15.5)
WBC: 14 10*3/uL — AB (ref 4.0–10.5)

## 2018-01-27 LAB — COMPREHENSIVE METABOLIC PANEL
ALK PHOS: 37 U/L — AB (ref 38–126)
ALT: 13 U/L (ref 0–44)
AST: 31 U/L (ref 15–41)
Albumin: 3.4 g/dL — ABNORMAL LOW (ref 3.5–5.0)
Anion gap: 9 (ref 5–15)
BUN: 25 mg/dL — AB (ref 8–23)
CALCIUM: 9.2 mg/dL (ref 8.9–10.3)
CHLORIDE: 105 mmol/L (ref 98–111)
CO2: 27 mmol/L (ref 22–32)
CREATININE: 1.03 mg/dL — AB (ref 0.44–1.00)
GFR calc non Af Amer: 53 mL/min — ABNORMAL LOW (ref >60)
GLUCOSE: 119 mg/dL — AB (ref 70–99)
Potassium: 4 mmol/L (ref 3.5–5.1)
SODIUM: 141 mmol/L (ref 135–145)
Total Bilirubin: 1.1 mg/dL (ref 0.3–1.2)
Total Protein: 6.4 g/dL — ABNORMAL LOW (ref 6.5–8.1)

## 2018-01-27 LAB — MAGNESIUM: Magnesium: 1.8 mg/dL (ref 1.7–2.4)

## 2018-01-27 LAB — PHOSPHORUS: Phosphorus: 3.6 mg/dL (ref 2.5–4.6)

## 2018-01-27 MED ORDER — HYDROCOD POLST-CPM POLST ER 10-8 MG/5ML PO SUER
5.0000 mL | Freq: Two times a day (BID) | ORAL | Status: DC
  Administered 2018-01-27 – 2018-01-28 (×2): 5 mL via ORAL
  Filled 2018-01-27 (×2): qty 5

## 2018-01-27 MED ORDER — HYDROCOD POLST-CPM POLST ER 10-8 MG/5ML PO SUER: 5.0000 mL | Freq: Two times a day (BID) | ORAL | Status: DC | PRN

## 2018-01-27 MED ORDER — IPRATROPIUM-ALBUTEROL 0.5-2.5 (3) MG/3ML IN SOLN
3.0000 mL | Freq: Two times a day (BID) | RESPIRATORY_TRACT | Status: DC
  Administered 2018-01-27 – 2018-01-28 (×2): 3 mL via RESPIRATORY_TRACT
  Filled 2018-01-27 (×2): qty 3

## 2018-01-27 NOTE — Care Management Important Message (Signed)
Important Message  Patient Details  Name: Kimberly Irwin MRN: [id number] Date of Birth: April 26, 1946   Medicare Important Message Given:  Yes    Kerin Salen 01/27/2018, 10:11 AMImportant Message  Patient Details  Name: Kimberly Irwin MRN: [id number] Date of Birth: 15-Dec-1945   Medicare Important Message Given:  Yes    Kerin Salen 01/27/2018, 10:11 AM

## 2018-01-27 NOTE — Progress Notes (Signed)
Nurse notified

## 2018-01-27 NOTE — Evaluation (Addendum)
Physical Therapy Evaluation/Discharge Summary Patient Details Name: Kimberly Irwin MRN: [id number] DOB: 05-Apr-1946 Today's Date: 01/27/2018   History of Present Illness  72 yr old female admitted 01/24/2018 with COPD with acute exacerbation. PMH: COPD, HTN, chronic pain syndrome, chronic respiratory failure with hypoxia.    Clinical Impression  Patient performed well with mild balance deficits noted and need for supplemental oxygen at 5 L for mobility. PTA patient did her own cooking, cleaning and laundry. Did not use an assistive device PTA. Reportedly is a mouth breather and does better with a mask than a nasal cannula. Patient evaluated by Physical Therapy with no further acute PT needs identified. All education has been completed and the patient has no further questions. See below for any follow-up Physical Therapy or equipment needs. PT is signing off. Thank you for this referral.   SATURATION QUALIFICATIONS: (This note is used to comply with regulatory documentation for home oxygen)  Patient Saturations on Room Air at Rest = 89%  Patient Saturations on Room Air while Ambulating 60 feet = 82%  Patient Saturations on 5 Liters of oxygen while Ambulating 240 feet = 83%  Please briefly explain why patient needs home oxygen: to allow for mobility and upright activities decreasing the likelihood of hypoxia.     Follow Up Recommendations Supervision - Intermittent;Home health PT    Equipment Recommendations  None recommended by PT    Recommendations for Other Services       Precautions / Restrictions Precautions Precautions: Fall Restrictions Weight Bearing Restrictions: No      Mobility  Bed Mobility               General bed mobility comments: patient received in recliner  Transfers Overall transfer level: Independent Equipment used: None                Ambulation/Gait Ambulation/Gait assistance: Counsellor (Feet): 300 Feet Assistive device:  None Gait Pattern/deviations: Step-through pattern;Decreased step length - right;Decreased step length - left;Decreased stride length Gait velocity: decr; on 5L O2 desat to 83 in 240; no O2 desat to 82 in 60 feet.    General Gait Details: 1 minor LOB to the left shortly after standing and walking; able to recover balance independently.  Stairs            Wheelchair Mobility    Modified Rankin (Stroke Patients Only)       Balance Overall balance assessment: Mild deficits observed, not formally tested                                           Pertinent Vitals/Pain Pain Assessment: No/denies pain    Home Living Family/patient expects to be discharged to:: Private residence Living Arrangements: Children(oldest daughter.) Available Help at Discharge: Family Type of Home: House Home Access: [address]: One Coats: Environmental consultant - 2 wheels;Cane - single point;Bedside commode;Shower seat;Grab bars - tub/shower      Prior Function Level of Independence: Independent               Hand Dominance        Extremity/Trunk Assessment   Upper Extremity Assessment Upper Extremity Assessment: Generalized weakness    Lower Extremity Assessment Lower Extremity Assessment: Generalized weakness    Cervical / Trunk Assessment Cervical / Trunk Assessment: Kyphotic  Communication   Communication:  No difficulties  Cognition Arousal/Alertness: Awake/alert Behavior During Therapy: WFL for tasks assessed/performed Overall Cognitive Status: Within Functional Limits for tasks assessed                                        General Comments      Exercises     Assessment/Plan    PT Assessment All further PT needs can be met in the next venue of care  PT Problem List Decreased strength;Decreased activity tolerance;Decreased balance       PT Treatment Interventions      PT Goals (Current goals can be  found in the Care Plan section)  Acute Rehab PT Goals Patient Stated Goal: go home PT Goal Formulation: With patient/family Time For Goal Achievement: 02/03/18 Potential to Achieve Goals: Good    Frequency     Barriers to discharge        Co-evaluation               AM-PAC PT "6 Clicks" Daily Activity  Outcome Measure Difficulty turning over in bed (including adjusting bedclothes, sheets and blankets)?: None Difficulty moving from lying on back to sitting on the side of the bed? : A Little Difficulty sitting down on and standing up from a chair with arms (e.g., wheelchair, bedside commode, etc,.)?: None Help needed moving to and from a bed to chair (including a wheelchair)?: A Little Help needed walking in hospital room?: A Little Help needed climbing 3-5 steps with a railing? : A Little 6 Click Score: 20    End of Session Equipment Utilized During Treatment: Gait belt;Oxygen;Other (comment)(5L 2) Activity Tolerance: Patient tolerated treatment well;Other (comment)(Patient limited by O2 desat) Patient left: in chair;with family/visitor present;with call bell/phone within reach Nurse Communication: Mobility status PT Visit Diagnosis: Unsteadiness on feet (R26.81);Muscle weakness (generalized) (M62.81);Difficulty in walking, not elsewhere classified (R26.2)    Time: 8882-8003 PT Time Calculation (min) (ACUTE ONLY): 30 min   Charges:   PT Evaluation $PT Eval Moderate Complexity: 1 Mod PT Treatments $Gait Training: 8-22 mins   PT G Codes:        Yassen Kinnett D. Hartnett-Rands, MS, PT Per [place] #[id number] 01/27/2018, 3:46 PM

## 2018-01-27 NOTE — Telephone Encounter (Signed)
Spoke with Kimberly Irwin and she states her mom is still admitted and they did receive pulmonary consult yesterday evening. She states she doesn't need anything from [place] at this point. Just FYI

## 2018-01-27 NOTE — Progress Notes (Signed)
PULMONARY / CRITICAL CARE MEDICINE   Name: Kimberly Irwin MRN: [id number] DOB: [birth year]    ADMISSION DATE:  01/24/2018 CONSULTATION DATE:  01/26/2018  REFERRING MD:  Dr. Alfredia Ferguson, [facility]  CHIEF COMPLAINT:  Short of breath  HISTORY OF PRESENT ILLNESS:   72 yo female former smoker presented with dyspnea, cough, hypoxia.  She was started on therapy for COPD exacerbation and hypervolemia.  Her symptoms started about 3 days prior to admission.  She has been getting dry cough and wheezing.  No sinus pressure, ear pain, fever, sore throat, chest pain, abdominal pain, diarrhea, skin rash, or leg swelling.  No recent sick exposures.  She is normally on 4 liters oxygen 24/7 at home.  She has hx of Emphysema, Sarcoidosis, and pulmonary hypertension.  She is followed by Dr. Lake Bells and Dr. Aundra Dubin.  She reports only taking adcirca at home.  She was to be started on opsumit and was only approved for this around the time of her admission to hospital.  PAST MEDICAL HISTORY :  CAD, GERD, HH, HLD, HTN, Hypothyroidism, Tremor  SUBJECTIVE:  Still has cough, mostly at night.  Walked in room some yesterday >> Felt wobbly.  Transitioned back to nasal cannula.  VITAL SIGNS: BP (!) 148/84 (BP Location: Right Arm)   Pulse (!) 59   Temp (!) 97.4 F (36.3 C)   Resp 20   Ht _0  (1.6 m)   Wt 168 lb 6.9 oz (76.4 kg)   SpO2 90%   BMI 29.84 kg/m   INTAKE / OUTPUT: I/O last 3 completed shifts: In: 363 [P.O.:360; I.V.:3] Out: 1800 [Urine:1800]  PHYSICAL EXAMINATION:  General - pleasant Eyes - pupils reactive ENT - no sinus tenderness, no oral exudate, no LAN Cardiac - regular, no murmur Chest - better air movement, faint b/l wheeze Abd - soft, non tender Ext - no edema Skin - no rashes Neuro - resting tremor  LABS:  BMET Recent Labs  Lab 01/25/18 0547 01/26/18 0626 01/27/18 0618  NA 141 141 141  K 3.6 3.7 4.0  CL 104 104 105  CO2 _1 BUN 15 23 25*  CREATININE 1.11* 1.00 1.03*   GLUCOSE 172* 153* 119*    Electrolytes Recent Labs  Lab 01/25/18 0547 01/26/18 0626 01/27/18 0618  CALCIUM 9.3 9.4 9.2  MG  --  1.9 1.8  PHOS  --  3.6 3.6    CBC Recent Labs  Lab 01/25/18 0547 01/26/18 0626 01/27/18 0618  WBC 6.7 12.5* 14.0*  HGB 15.0 13.6 13.7  HCT 43.9 41.3 42.0  PLT 169 182 205    Coag's No results for input(s): APTT, INR in the last 168 hours.  Sepsis Markers No results for input(s): LATICACIDVEN, PROCALCITON, O2SATVEN in the last 168 hours.  ABG No results for input(s): PHART, PCO2ART, PO2ART in the last 168 hours.  Liver Enzymes Recent Labs  Lab 01/26/18 0626 01/27/18 0618  AST 19 31  ALT 14 13  ALKPHOS 39 37*  BILITOT 0.6 1.1  ALBUMIN 3.5 3.4*    Cardiac Enzymes Recent Labs  Lab 01/24/18 2149  TROPONINI <0.03    Glucose No results for input(s): GLUCAP in the last 168 hours.  Imaging Dg Chest Port 1 View  Result Date: 01/27/2018 CLINICAL DATA:  Short of breath EXAM: PORTABLE CHEST 1 VIEW COMPARISON:  01/26/2018 FINDINGS: Moderate cardiomegaly. Mild vascular congestion without pulmonary edema. Bibasilar atelectasis. Low lung volumes. No pneumothorax. IMPRESSION: Mild vascular congestion.  No evidence of pulmonary edema.  Electronically Signed   By: Marybelle Killings M.D.   On: 01/27/2018 06:47     STUDIES:  Mediastinoscopy 06/20/02 >> Non necrotizing granulomas A1AT 10/01/16 >> 118, MM PFT 10/19/17 >> FEV1 1.83 (84%), FEV1% 75, TLC 3.83 (75%), DLCO 25% HRCT 10/20/17 >> atherosclerosis, mild air trapping, septal thickening RLL, bronchial wall thickening, moderate centrilobular and mild paraseptal emphysema, 6 mm RUL nodule, 3 mm RUL nodule RHC 11/19/17 >> RA mean 2, RV 66/5, PA 66/16/34, PCWP 3, CI 2.04, PVR 8.3 WU Serology 12/04/17 >> ANA, SCL 70, anti centromere, RF all negative V/Q scan 12/09/17 >> normal perfusion scan Echo 01/11/18 >> EF 55 to 60%, mild LVH, grade 1 DD, mod RV systolic dysfx, PAS 83 mmHg  CULTURES: HIV 12/04/17  >> negative Respiratory viral panel 01/25/18 >> negative  ANTIBIOTICS: Doxycycline 6/24 >>   DISCUSSION: She has worsening dyspnea, cough, hypoxia, and wheeze likely from COPD exacerbation.  She has hx of WHO group 1, 2, 3 pulmonary hypertension.  ASSESSMENT / PLAN:  Acute on chronic hypoxic respiratory failure. - oxygen to keep SpO2 90 to 95%  COPD exacerbation. - continue solumedrol >> might be able to transition to prednisone 6/27 - scheduled BDs - day 3 doxycycline - adjust antitussive regimen  Pulmonary hypertension. - continue adcirca - she will f/u with Dr. Aundra Dubin as outpt to arrange for starting opsumit - will need to arrange for sleep study to assess for sleep apnea as an outpt  Deconditioning. - PT assessment  DVT prophylaxis - lovenox SUP - protonix Nutrition - 2gm Na Goals of care - full code  Chesley Mires, MD [place] 01/27/2018, 8:56 AM

## 2018-01-27 NOTE — Progress Notes (Signed)
PROGRESS NOTE    Kimberly Irwin   WUJ:[id number]  DOB: Jul 05, 1946  DOA: 01/24/2018 PCP: Mosie Lukes, MD   Brief Narrative:  [name] Colon Flattery a 72 y.o.femalewith medical history significant ofCOPD, chronic respiratory failure on 4 L of oxygen continuous at home, chronic pain syndrome, hypertension, sarcoidosis comes in with 2 days of productive cough and shortness of breath. She denies any lower extremity edema. She denies any orthopnea or PND. She denies any chest pain. Patient had called and she was noted to have oxygen level in the 80s despite her 4 L of oxygen. Referred for admission for acute on chronic respiratory failure likely secondary to COPD exacerbation.  He is being treated with duo nebs, steroids, antibiotics, supplemental oxygen via nasal cannula.  Did receive 1 dose of IV diuretics in the ED and will hold further doses at this time.  Subjective: Cough is severe today. Has trouble sleeping.     Assessment & Plan:   Principal Problem:   COPD with acute exacerbation - pulmonary HTN- acute hypoxic respiratory failure   Chronic respiratory failure with hypoxia (HCC) - on 5 L O2 intead of 4 L currently- will continue attempts wean and obtain an ambulatory pulse ox - cont Nebs, Doxy and steroids which I have weaned today - appreciate pulm consult-   Cont Adcirca- Opsumit is being arranged by heart failure team  Active Problems:   Essential hypertension - Atenolol    Chronic pain syndrome - PRN Tramadol, Neurontin   DVT prophylaxis: Lovenox Code Status: Full code Family Communication:  Disposition Plan: home possibly tomorrow Consultants:   PCCM Procedures:    Antimicrobials:  Anti-infectives (From admission, onward)   Start     Dose/Rate Route Frequency Ordered Stop   01/25/18 1000  doxycycline (VIBRA-TABS) tablet 100 mg     100 mg Oral Every 12 hours 01/25/18 0917         Objective: Vitals:   01/26/18 2003 01/26/18 2031 01/27/18  0458 01/27/18 0805  BP:  134/76 (!) 148/84   Pulse:  71 (!) 59   Resp:  20 20   Temp:  98.1 F (36.7 C) (!) 97.4 F (36.3 C)   TempSrc:  Oral    SpO2: 94% 92% (!) 89% 90%  Weight:      Height:        Intake/Output Summary (Last 24 hours) at 01/27/2018 1306 Last data filed at 01/27/2018 0930 Gross per 24 hour  Intake 480 ml  Output 1200 ml  Net -720 ml   Filed Weights   01/25/18 0510 01/26/18 0832  Weight: 76.4 kg (168 lb 6.9 oz) 76.4 kg (168 lb 6.9 oz)    Examination: General exam: Appears comfortable  HEENT: PERRLA, oral mucosa moist, no sclera icterus or thrush Respiratory system: mild wheeze, cough + Cardiovascular system: S1 & S2 heard, RRR.   Gastrointestinal system: Abdomen soft, non-tender, nondistended. Normal bowel sound. No organomegaly Central nervous system: Alert and oriented. No focal neurological deficits. Extremities: No cyanosis, clubbing or edema Skin: No rashes or ulcers Psychiatry:  Mood & affect appropriate.     Data Reviewed: I have personally reviewed following labs and imaging studies  CBC: Recent Labs  Lab 01/24/18 2149 01/25/18 0547 01/26/18 0626 01/27/18 0618  WBC 10.4 6.7 12.5* 14.0*  NEUTROABS 8.7*  --  11.1* 11.6*  HGB 13.4 15.0 13.6 13.7  HCT 39.9 43.9 41.3 42.0  MCV 91.1 90.1 90.8 92.3  PLT 157 169 182 205  Basic Metabolic Panel: Recent Labs  Lab 01/24/18 2149 01/25/18 0547 01/26/18 0626 01/27/18 0618  NA 138 141 141 141  K 3.7 3.6 3.7 4.0  CL 109 104 104 105  CO2 _0 GLUCOSE 127* 172* 153* 119*  BUN _1 25*  CREATININE 1.12* 1.11* 1.00 1.03*  CALCIUM 8.9 9.3 9.4 9.2  MG  --   --  1.9 1.8  PHOS  --   --  3.6 3.6   GFR: Estimated Creatinine Clearance: 47.6 mL/min (A) (by C-G formula based on SCr of 1.03 mg/dL (H)). Liver Function Tests: Recent Labs  Lab 01/26/18 0626 01/27/18 0618  AST 19 31  ALT 14 13  ALKPHOS 39 37*  BILITOT 0.6 1.1  PROT 6.8 6.4*  ALBUMIN 3.5 3.4*   No results for  input(s): LIPASE, AMYLASE in the last 168 hours. No results for input(s): AMMONIA in the last 168 hours. Coagulation Profile: No results for input(s): INR, PROTIME in the last 168 hours. Cardiac Enzymes: Recent Labs  Lab 01/24/18 2149  TROPONINI <0.03   BNP (last 3 results) No results for input(s): PROBNP in the last 8760 hours. HbA1C: No results for input(s): HGBA1C in the last 72 hours. CBG: No results for input(s): GLUCAP in the last 168 hours. Lipid Profile: No results for input(s): CHOL, HDL, LDLCALC, TRIG, CHOLHDL, LDLDIRECT in the last 72 hours. Thyroid Function Tests: No results for input(s): TSH, T4TOTAL, FREET4, T3FREE, THYROIDAB in the last 72 hours. Anemia Panel: No results for input(s): VITAMINB12, FOLATE, FERRITIN, TIBC, IRON, RETICCTPCT in the last 72 hours. Urine analysis:    Component Value Date/Time   COLORURINE YELLOW 08/28/2014 1122   APPEARANCEUR CLEAR 08/28/2014 1122   LABSPEC 1.015 08/28/2014 1122   PHURINE 6.0 08/28/2014 1122   GLUCOSEU NEGATIVE 08/28/2014 1122   HGBUR NEGATIVE 08/28/2014 1122   HGBUR negative 06/01/2008 [place] 08/28/2014 1122   KETONESUR NEGATIVE 08/28/2014 1122   PROTEINUR NEGATIVE 04/22/2014 1703   UROBILINOGEN 0.2 08/28/2014 1122   NITRITE NEGATIVE 08/28/2014 1122   LEUKOCYTESUR SMALL (A) 08/28/2014 1122   Sepsis Labs: _2 (procalcitonin:4,lacticidven:4) ) Recent Results (from the past 240 hour(s))  Respiratory Panel by PCR     Status: None   Collection Time: 01/25/18 11:53 AM  Result Value Ref Range Status   Adenovirus NOT DETECTED NOT DETECTED Final   Coronavirus 229E NOT DETECTED NOT DETECTED Final   Coronavirus HKU1 NOT DETECTED NOT DETECTED Final   Coronavirus NL63 NOT DETECTED NOT DETECTED Final   Coronavirus OC43 NOT DETECTED NOT DETECTED Final   Metapneumovirus NOT DETECTED NOT DETECTED Final   Rhinovirus / Enterovirus NOT DETECTED NOT DETECTED Final   Influenza A NOT DETECTED NOT  DETECTED Final   Influenza B NOT DETECTED NOT DETECTED Final   Parainfluenza Virus 1 NOT DETECTED NOT DETECTED Final   Parainfluenza Virus 2 NOT DETECTED NOT DETECTED Final   Parainfluenza Virus 3 NOT DETECTED NOT DETECTED Final   Parainfluenza Virus 4 NOT DETECTED NOT DETECTED Final   Respiratory Syncytial Virus NOT DETECTED NOT DETECTED Final   Bordetella pertussis NOT DETECTED NOT DETECTED Final   Chlamydophila pneumoniae NOT DETECTED NOT DETECTED Final   Mycoplasma pneumoniae NOT DETECTED NOT DETECTED Final    Comment: Performed at [facility], [place] [address] [place], [place] [address]         Radiology Studies: Dg Chest Port 1 View  Result Date: 01/27/2018 CLINICAL DATA:  Short of breath EXAM: PORTABLE  CHEST 1 VIEW COMPARISON:  01/26/2018 FINDINGS: Moderate cardiomegaly. Mild vascular congestion without pulmonary edema. Bibasilar atelectasis. Low lung volumes. No pneumothorax. IMPRESSION: Mild vascular congestion.  No evidence of pulmonary edema. Electronically Signed   By: Marybelle Killings M.D.   On: 01/27/2018 06:47   Dg Chest Port 1 View  Result Date: 01/26/2018 CLINICAL DATA:  Shortness of breath EXAM: PORTABLE CHEST 1 VIEW COMPARISON:  01/25/2018 FINDINGS: Cardiac shadow remains enlarged. The lungs are well aerated bilaterally. No focal infiltrate or sizable effusion is seen. No bony abnormality is noted. IMPRESSION: No active disease. Electronically Signed   By: Inez Catalina M.D.   On: 01/26/2018 08:00      Scheduled Meds: . arformoterol  15 mcg Nebulization BID  . aspirin EC  81 mg Oral QHS  . atenolol  25 mg Oral QHS  . atenolol  50 mg Oral Daily  . budesonide (PULMICORT) nebulizer solution  0.5 mg Nebulization BID  . chlorpheniramine-HYDROcodone  5 mL Oral Q12H  . cholecalciferol  2,000 Units Oral QPM  . citalopram  10 mg Oral QHS  . doxycycline  100 mg Oral Q12H  . enoxaparin (LOVENOX) injection  40 mg Subcutaneous QHS  . gabapentin  300 mg Oral QHS  .  guaiFENesin  1,200 mg Oral BID  . ipratropium-albuterol  3 mL Nebulization BID  . levothyroxine  112 mcg Oral QAC breakfast  . loratadine  10 mg Oral Daily  . methylPREDNISolone (SOLU-MEDROL) injection  60 mg Intravenous Q12H  . omega-3 acid ethyl esters  2 g Oral Daily  . pantoprazole  40 mg Oral Daily  . rosuvastatin  20 mg Oral QHS  . sodium chloride flush  3 mL Intravenous Q12H  . tadalafil  20 mg Oral Daily  . vitamin B-12  1,000 mcg Oral Daily   Continuous Infusions: . sodium chloride       LOS: 3 days    Time spent in minutes: 35    Debbe Odea, MD [facility] Pager: [email or web address] Password [email or web address] 01/27/2018, 1:06 PM

## 2018-01-28 DIAGNOSIS — J9621 Acute and chronic respiratory failure with hypoxia: Secondary | ICD-10-CM

## 2018-01-28 DIAGNOSIS — E669 Obesity, unspecified: Secondary | ICD-10-CM

## 2018-01-28 DIAGNOSIS — D869 Sarcoidosis, unspecified: Secondary | ICD-10-CM

## 2018-01-28 DIAGNOSIS — I272 Pulmonary hypertension, unspecified: Secondary | ICD-10-CM

## 2018-01-28 DIAGNOSIS — Z6830 Body mass index (BMI) 30.0-30.9, adult: Secondary | ICD-10-CM

## 2018-01-28 MED ORDER — GUAIFENESIN ER 600 MG PO TB12: 1200.0000 mg | ORAL_TABLET | Freq: Two times a day (BID) | ORAL | 0 refills | Status: DC

## 2018-01-28 MED ORDER — DOXYCYCLINE HYCLATE 100 MG PO TABS: 100.0000 mg | ORAL_TABLET | Freq: Two times a day (BID) | ORAL | 0 refills | Status: DC

## 2018-01-28 MED ORDER — AEROCHAMBER PLUS W/MASK MISC: 2 refills | Status: AC

## 2018-01-28 MED ORDER — PREDNISONE 10 MG PO TABS: 30.0000 mg | ORAL_TABLET | Freq: Every day | ORAL | 0 refills | Status: DC

## 2018-01-28 MED ORDER — HYDROCOD POLST-CPM POLST ER 10-8 MG/5ML PO SUER: 5.0000 mL | Freq: Two times a day (BID) | ORAL | 0 refills | Status: DC

## 2018-01-28 MED ORDER — PREDNISONE 20 MG PO TABS: 30.0000 mg | ORAL_TABLET | Freq: Every day | ORAL | Status: DC

## 2018-01-28 MED ORDER — AEROCHAMBER PLUS W/MASK MISC
1.0000 | Freq: Once | Status: AC
  Administered 2018-01-28: 1
  Filled 2018-01-28 (×2): qty 1

## 2018-01-28 NOTE — Progress Notes (Signed)
PULMONARY / CRITICAL CARE MEDICINE   Name: Kimberly Irwin MRN: [id number] DOB: [birth year]    ADMISSION DATE:  01/24/2018 CONSULTATION DATE:  01/26/2018  REFERRING MD:  Dr. Alfredia Ferguson, [facility]  CHIEF COMPLAINT:  Short of breath  HISTORY OF PRESENT ILLNESS:   72 yo female former smoker presented with dyspnea, cough, hypoxia.  She was started on therapy for COPD exacerbation and hypervolemia.  Her symptoms started about 3 days prior to admission.  She has been getting dry cough and wheezing.  No sinus pressure, ear pain, fever, sore throat, chest pain, abdominal pain, diarrhea, skin rash, or leg swelling.  No recent sick exposures.  She is normally on 4 liters oxygen 24/7 at home.  She has hx of Emphysema, Sarcoidosis, and pulmonary hypertension.  She is followed by Dr. Lake Bells and Dr. Aundra Dubin.  She reports only taking adcirca at home.  She was to be started on opsumit and was only approved for this around the time of her admission to hospital.  PAST MEDICAL HISTORY :  CAD, GERD, HH, HLD, HTN, Hypothyroidism, Tremor  SUBJECTIVE:  Feeling better.  Slept better last night.  Had to use cough medicine.  Not having as much wheeze.  Not bringing up sputum.  VITAL SIGNS: BP 139/86 (BP Location: Right Arm)   Pulse (!) 52   Temp (!) 97.4 F (36.3 C) (Oral)   Resp (!) 21   Ht _0  (1.6 m)   Wt 168 lb 6.9 oz (76.4 kg)   SpO2 96%   BMI 29.84 kg/m   INTAKE / OUTPUT: I/O last 3 completed shifts: In: 360 [P.O.:360] Out: 400 [Urine:400]  PHYSICAL EXAMINATION:  General - pleasant Eyes - pupils reactive ENT - no sinus tenderness, no oral exudate, no LAN Cardiac - regular, no murmur Chest - minimal wheeze Rt lung base, rales Abd - soft, non tender Ext - no edema Skin - no rashes Neuro - normal strength, resting tremor Psych - normal mood  LABS:  BMET Recent Labs  Lab 01/25/18 0547 01/26/18 0626 01/27/18 0618  NA 141 141 141  K 3.6 3.7 4.0  CL 104 104 105  CO2 _1 BUN 15 23 25*   CREATININE 1.11* 1.00 1.03*  GLUCOSE 172* 153* 119*    Electrolytes Recent Labs  Lab 01/25/18 0547 01/26/18 0626 01/27/18 0618  CALCIUM 9.3 9.4 9.2  MG  --  1.9 1.8  PHOS  --  3.6 3.6    CBC Recent Labs  Lab 01/25/18 0547 01/26/18 0626 01/27/18 0618  WBC 6.7 12.5* 14.0*  HGB 15.0 13.6 13.7  HCT 43.9 41.3 42.0  PLT 169 182 205    Coag's No results for input(s): APTT, INR in the last 168 hours.  Sepsis Markers No results for input(s): LATICACIDVEN, PROCALCITON, O2SATVEN in the last 168 hours.  ABG No results for input(s): PHART, PCO2ART, PO2ART in the last 168 hours.  Liver Enzymes Recent Labs  Lab 01/26/18 0626 01/27/18 0618  AST 19 31  ALT 14 13  ALKPHOS 39 37*  BILITOT 0.6 1.1  ALBUMIN 3.5 3.4*    Cardiac Enzymes Recent Labs  Lab 01/24/18 2149  TROPONINI <0.03    Glucose No results for input(s): GLUCAP in the last 168 hours.  Imaging No results found.   STUDIES:  Mediastinoscopy 06/20/02 >> Non necrotizing granulomas A1AT 10/01/16 >> 118, MM PFT 10/19/17 >> FEV1 1.83 (84%), FEV1% 75, TLC 3.83 (75%), DLCO 25% HRCT 10/20/17 >> atherosclerosis, mild air trapping, septal thickening  RLL, bronchial wall thickening, moderate centrilobular and mild paraseptal emphysema, 6 mm RUL nodule, 3 mm RUL nodule RHC 11/19/17 >> RA mean 2, RV 66/5, PA 66/16/34, PCWP 3, CI 2.04, PVR 8.3 WU Serology 12/04/17 >> ANA, SCL 70, anti centromere, RF all negative V/Q scan 12/09/17 >> normal perfusion scan Echo 01/11/18 >> EF 55 to 60%, mild LVH, grade 1 DD, mod RV systolic dysfx, PAS 83 mmHg  CULTURES: HIV 12/04/17 >> negative Respiratory viral panel 01/25/18 >> negative  ANTIBIOTICS: Doxycycline 6/24 >>   DISCUSSION: She has worsening dyspnea, cough, hypoxia, and wheeze likely from COPD exacerbation.  She has hx of WHO group 1, 2, 3 pulmonary hypertension.  Improved.  Can transition to oral steroids.  Will need to arrange for spacer device while using  symbicort.  ASSESSMENT / PLAN:  Acute on chronic hypoxic respiratory failure. - oxygen to keep SpO2 90 to 95%  COPD exacerbation. - change to prednisone 30 mg daily and taper off as tolerated over next 1 week - resume symbicort with spacer device - day 4 of doxycycline - prn tussionex for cough  Pulmonary hypertension. - continue adcirca - f/u with Dr. Aundra Dubin to arrange for opsumit - will need outpt sleep study when more stable  Deconditioning. - seen by PT 6/26 >> home health PT  DVT prophylaxis - lovenox SUP - protonix Nutrition - 2gm Na Goals of care - full code  Okay to d/c home from pulmonary standpoint.  I have scheduled her for pulmonary office f/u with Wyn Quaker, NP on Wednesday, July 3 at 10 am.   Chesley Mires, MD [place] 01/28/2018, 9:12 AM

## 2018-01-28 NOTE — Discharge Summary (Signed)
Physician Discharge Summary  Kimberly Irwin WNU:[id number] DOB: 1946-05-24 DOA: 01/24/2018  PCP: Mosie Lukes, MD  Admit date: 01/24/2018 Discharge date: 01/28/2018  Admitted From: home Disposition:  home   Recommendations for Outpatient Follow-up:  1. Wean O2 as possible  Home Health:  Ordered including respiratory therapy  Equipment/Devices:  Home O2  Discharge Condition:  stable   CODE STATUS:  Full code   Consultations:  PCCM    Discharge Diagnoses:  Principal Problem:   Acute on chronic respiratory failure with hypoxia ([place]) Active Problems:   Sarcoidosis stage I   COPD with acute exacerbation (HCC)   Pulmonary HTN ([place])   Essential hypertension   Chronic pain syndrome   Obesity       Brief Summary: Kimberly Irwin Kimberly Irwin a 72 y.o.femalewith medical history significant ofCOPD, chronic respiratory failure on 4 L of oxygen continuous at home, chronic pain syndrome, hypertension, sarcoidosis comes in with 2 days of productive cough and shortness of breath. She denies any lower extremity edema. She denies any orthopnea or PND. She denies any chest pain. Patient had called and she was noted to have oxygen level in the 80s despite her 4 L of oxygen. Referred for admission for acute on chronic respiratory failure likely secondary to COPD exacerbation. He is being treated with duo nebs, steroids, antibiotics, supplemental oxygen via nasal cannula. Did receive 1 dose of IV diuretics in the ED and will hold further doses at this time.  Hospital Course:  Principal Problem:   COPD with acute exacerbation - acute hypoxic respiratory failure   Chronic respiratory failure with hypoxia  Pulmonary HTN- Mod to severe - appreciate pulm consult - cont Nebs, Doxy and steroids - she has been transitioned to Prednisone today and recommended to start at 30 mg and taper to off in 7 days -  per ECHO in 01/11/18> pulm peak pressure 83 mm Hg - group 5 due to sarcoidosis- Cont  Adcirca- Opsumit is being arranged by heart failure team - resume symbicort with spacer device - day 4/ 7 of doxycycline - prn tussionex for cough   Active Problems:   Essential hypertension - Atenolol    Chronic pain syndrome - PRN Tramadol, Neurontin     Discharge Exam: Vitals:   01/28/18 0559 01/28/18 0754  BP: 139/86   Pulse: (!) 52   Resp: (!) 21   Temp: (!) 97.4 F (36.3 C)   SpO2: (!) 89% 96%   Vitals:   01/27/18 2106 01/27/18 2256 01/28/18 0559 01/28/18 0754  BP: (!) 152/83 (!) 156/83 139/86   Pulse: (!) 53 60 (!) 52   Resp: 20  (!) 21   Temp: 97.6 F (36.4 C)  (!) 97.4 F (36.3 C)   TempSrc: Oral  Oral   SpO2: 93%  (!) 89% 96%  Weight:      Height:        General: Pt is alert, awake, not in acute distress Cardiovascular: RRR, S1/S2 +, no rubs, no gallops Respiratory: CTA bilaterally, no wheezing, no rhonchi Abdominal: Soft, NT, ND, bowel sounds + Extremities: no edema, no cyanosis   Discharge Instructions  Discharge Instructions    Diet - low sodium heart healthy   Complete by:  As directed    Discharge instructions   Complete by:  As directed    Continue Incentive Spirometry and Flutter valve every hour   Increase activity slowly   Complete by:  As directed      Allergies  as of 01/28/2018      Reactions   Lipitor [atorvastatin] Swelling   Penicillins Swelling   Has patient had a PCN reaction causing immediate rash, facial/tongue/throat swelling, SOB or lightheadedness with hypotension: Yes Has patient had a PCN reaction causing severe rash involving mucus membranes or skin necrosis: No Has patient had a PCN reaction that required hospitalization: No Has patient had a PCN reaction occurring within the last 10 years: No If all of the above answers are "NO", then may proceed with Cephalosporin use.   Metronidazole Swelling   Pregabalin Other (See Comments)   REACTION: Somnolence and dizziness   Simvastatin Other (See Comments)   Leg pain       Medication List    TAKE these medications   acetaminophen 500 MG tablet Commonly known as:  TYLENOL Take 1,000 mg by mouth every 6 (six) hours as needed for moderate pain.   aerochamber plus with mask inhaler Use as instructed   albuterol 108 (90 Base) MCG/ACT inhaler Commonly known as:  PROVENTIL HFA;VENTOLIN HFA Inhale 2 puffs into the lungs every 6 (six) hours as needed for wheezing or shortness of breath.   albuterol (2.5 MG/3ML) 0.083% nebulizer solution Commonly known as:  PROVENTIL Take 3 mLs (2.5 mg total) by nebulization every 6 (six) hours as needed for wheezing or shortness of breath.   aspirin EC 81 MG tablet Take 81 mg by mouth at bedtime.   atenolol 25 MG tablet Commonly known as:  TENORMIN Take 25 mg by mouth at bedtime. What changed:  Another medication with the same name was changed. Make sure you understand how and when to take each.   atenolol 50 MG tablet Commonly known as:  TENORMIN TAKE 1 TABLET (50 MG TOTAL) BY MOUTH DAILY. What changed:    how much to take  how to take this  when to take this  additional instructions   BESIVANCE 0.6 % Susp Generic drug:  Besifloxacin HCl Place 1 drop into the left eye See admin instructions. INSTILL 1 DROP INTO LEFT 4 TIMES DAILY x 2 DAYS AFTER EYE INJECTION   budesonide-formoterol 160-4.5 MCG/ACT inhaler Commonly known as:  SYMBICORT Inhale 2 puffs into the lungs 2 (two) times daily.   calcium carbonate 1500 (600 Ca) MG Tabs tablet Commonly known as:  OSCAL Take 1,200 mg by mouth daily with breakfast.   chlorpheniramine-HYDROcodone 10-8 MG/5ML Suer Commonly known as:  TUSSIONEX Take 5 mLs by mouth every 12 (twelve) hours.   Cinnamon 500 MG capsule Take 2,000 mg by mouth daily.   citalopram 10 MG tablet Commonly known as:  CELEXA Take 1 tablet (10 mg total) by mouth at bedtime.   Coenzyme Q10 200 MG capsule Take 200 mg by mouth at bedtime.   doxycycline 100 MG tablet Commonly known as:   VIBRA-TABS Take 1 tablet (100 mg total) by mouth every 12 (twelve) hours.   fexofenadine 180 MG tablet Commonly known as:  ALLEGRA Take 180 mg by mouth daily.   Fish Oil 1200 MG Caps Take 1,200 mg by mouth daily.   gabapentin 300 MG capsule Commonly known as:  NEURONTIN TAKE 1 CAPSULE (300 MG TOTAL) BY MOUTH AT BEDTIME.   GLUCOSAMINE PO Take 1 tablet by mouth every evening.   guaiFENesin 600 MG 12 hr tablet Commonly known as:  MUCINEX Take 2 tablets (1,200 mg total) by mouth 2 (two) times daily.   ketoconazole 2 % shampoo Commonly known as:  NIZORAL Apply 1 application topically 2 (two)  times a week. What changed:    when to take this  additional instructions   levothyroxine 112 MCG tablet Commonly known as:  SYNTHROID, LEVOTHROID TAKE 1 TABLET (112 MCG TOTAL) BY MOUTH DAILY BEFORE BREAKFAST.   macitentan 10 MG tablet Commonly known as:  OPSUMIT Take 1 tablet (10 mg total) by mouth daily.   omeprazole 40 MG capsule Commonly known as:  PRILOSEC TAKE 1 CAPSULE (40 MG TOTAL) BY MOUTH DAILY.   predniSONE 10 MG tablet Commonly known as:  DELTASONE Take 3 tablets (30 mg total) by mouth daily with breakfast. Take 30 mg x 2 days, 20 mg x 2 days, 10 mg x 2 days and 5 mg x 1 day   rosuvastatin 20 MG tablet Commonly known as:  CRESTOR TAKE 1 TABLET (20 MG TOTAL) BY MOUTH DAILY. What changed:  when to take this   tadalafil (PAH) 20 MG tablet Commonly known as:  ADCIRCA Take 1 tablet (20 mg total) by mouth daily.   traMADol 50 MG tablet Commonly known as:  ULTRAM Take 2 tablets (100 mg total) by mouth every 6 (six) hours as needed for moderate pain or severe pain.   vitamin B-12 1000 MCG tablet Commonly known as:  CYANOCOBALAMIN Take 1,000 mcg by mouth daily.   Vitamin D 2000 units tablet Take 2,000 Units by mouth every evening.      Follow-up Information    Lauraine Rinne, NP Follow up on 02/03/2018.   Specialty:  Pulmonary Disease Why:  follow up with lung  doctors at 10 am. Contact information: [address] [place] [address] [phone]          Allergies  Allergen Reactions  . Lipitor [Atorvastatin] Swelling  . Penicillins Swelling    Has patient had a PCN reaction causing immediate rash, facial/tongue/throat swelling, SOB or lightheadedness with hypotension: Yes Has patient had a PCN reaction causing severe rash involving mucus membranes or skin necrosis: No Has patient had a PCN reaction that required hospitalization: No Has patient had a PCN reaction occurring within the last 10 years: No If all of the above answers are "NO", then may proceed with Cephalosporin use.   . Metronidazole Swelling  . Pregabalin Other (See Comments)    REACTION: Somnolence and dizziness  . Simvastatin Other (See Comments)    Leg pain     Procedures/Studies:   Dg Chest Port 1 View  Result Date: 01/27/2018 CLINICAL DATA:  Short of breath EXAM: PORTABLE CHEST 1 VIEW COMPARISON:  01/26/2018 FINDINGS: Moderate cardiomegaly. Mild vascular congestion without pulmonary edema. Bibasilar atelectasis. Low lung volumes. No pneumothorax. IMPRESSION: Mild vascular congestion.  No evidence of pulmonary edema. Electronically Signed   By: Marybelle Killings M.D.   On: 01/27/2018 06:47   Dg Chest Port 1 View  Result Date: 01/26/2018 CLINICAL DATA:  Shortness of breath EXAM: PORTABLE CHEST 1 VIEW COMPARISON:  01/25/2018 FINDINGS: Cardiac shadow remains enlarged. The lungs are well aerated bilaterally. No focal infiltrate or sizable effusion is seen. No bony abnormality is noted. IMPRESSION: No active disease. Electronically Signed   By: Inez Catalina M.D.   On: 01/26/2018 08:00   Dg Chest Port 1 View  Result Date: 01/25/2018 CLINICAL DATA:  Shortness of breath and hypoxia EXAM: PORTABLE CHEST 1 VIEW COMPARISON:  01/24/2018 FINDINGS: Cardiac shadow is stable. Aortic calcifications are again seen. The lungs are well aerated bilaterally. No focal infiltrate  or sizable effusion is seen. No bony abnormality is seen. IMPRESSION: No  acute abnormality noted. Electronically Signed   By: Inez Catalina M.D.   On: 01/25/2018 09:56   Dg Chest Port 1 View  Result Date: 01/24/2018 CLINICAL DATA:  Dyspnea and hypoxia with cough. EXAM: PORTABLE CHEST 1 VIEW COMPARISON:  12/09/2017 FINDINGS: Cardiomegaly with increased interstitial lung markings and peribronchial thickening compatible with chronic bronchitic change. No alveolar consolidation, CHF, effusion or pneumothorax. Aortic atherosclerosis is noted near the arch. No aneurysm. No acute osseous abnormality. IMPRESSION: Stable cardiomegaly with aortic atherosclerosis. Chronic diffuse bronchitic change of the lungs. Electronically Signed   By: Ashley Royalty M.D.   On: 01/24/2018 22:00     The results of significant diagnostics from this hospitalization (including imaging, microbiology, ancillary and laboratory) are listed below for reference.     Microbiology: Recent Results (from the past 240 hour(s))  Respiratory Panel by PCR     Status: None   Collection Time: 01/25/18 11:53 AM  Result Value Ref Range Status   Adenovirus NOT DETECTED NOT DETECTED Final   Coronavirus 229E NOT DETECTED NOT DETECTED Final   Coronavirus HKU1 NOT DETECTED NOT DETECTED Final   Coronavirus NL63 NOT DETECTED NOT DETECTED Final   Coronavirus OC43 NOT DETECTED NOT DETECTED Final   Metapneumovirus NOT DETECTED NOT DETECTED Final   Rhinovirus / Enterovirus NOT DETECTED NOT DETECTED Final   Influenza A NOT DETECTED NOT DETECTED Final   Influenza B NOT DETECTED NOT DETECTED Final   Parainfluenza Virus 1 NOT DETECTED NOT DETECTED Final   Parainfluenza Virus 2 NOT DETECTED NOT DETECTED Final   Parainfluenza Virus 3 NOT DETECTED NOT DETECTED Final   Parainfluenza Virus 4 NOT DETECTED NOT DETECTED Final   Respiratory Syncytial Virus NOT DETECTED NOT DETECTED Final   Bordetella pertussis NOT DETECTED NOT DETECTED Final   Chlamydophila  pneumoniae NOT DETECTED NOT DETECTED Final   Mycoplasma pneumoniae NOT DETECTED NOT DETECTED Final    Comment: Performed at [facility], [place]. [address], [place], [place] [address]     Labs: BNP (last 3 results) Recent Labs    12/04/17 1247 01/24/18 2149  BNP 79.5 001.7*   Basic Metabolic Panel: Recent Labs  Lab 01/24/18 2149 01/25/18 0547 01/26/18 0626 01/27/18 0618  NA 138 141 141 141  K 3.7 3.6 3.7 4.0  CL 109 104 104 105  CO2 _0 GLUCOSE 127* 172* 153* 119*  BUN _1 25*  CREATININE 1.12* 1.11* 1.00 1.03*  CALCIUM 8.9 9.3 9.4 9.2  MG  --   --  1.9 1.8  PHOS  --   --  3.6 3.6   Liver Function Tests: Recent Labs  Lab 01/26/18 0626 01/27/18 0618  AST 19 31  ALT 14 13  ALKPHOS 39 37*  BILITOT 0.6 1.1  PROT 6.8 6.4*  ALBUMIN 3.5 3.4*   No results for input(s): LIPASE, AMYLASE in the last 168 hours. No results for input(s): AMMONIA in the last 168 hours. CBC: Recent Labs  Lab 01/24/18 2149 01/25/18 0547 01/26/18 0626 01/27/18 0618  WBC 10.4 6.7 12.5* 14.0*  NEUTROABS 8.7*  --  11.1* 11.6*  HGB 13.4 15.0 13.6 13.7  HCT 39.9 43.9 41.3 42.0  MCV 91.1 90.1 90.8 92.3  PLT 157 169 182 205   Cardiac Enzymes: Recent Labs  Lab 01/24/18 2149  TROPONINI <0.03   BNP: Invalid input(s): POCBNP CBG: No results for input(s): GLUCAP in the last 168 hours. D-Dimer No results for input(s): DDIMER in the last 72 hours. Hgb  A1c No results for input(s): HGBA1C in the last 72 hours. Lipid Profile No results for input(s): CHOL, HDL, LDLCALC, TRIG, CHOLHDL, LDLDIRECT in the last 72 hours. Thyroid function studies No results for input(s): TSH, T4TOTAL, T3FREE, THYROIDAB in the last 72 hours.  Invalid input(s): FREET3 Anemia work up No results for input(s): VITAMINB12, FOLATE, FERRITIN, TIBC, IRON, RETICCTPCT in the last 72 hours. Urinalysis    Component Value Date/Time   COLORURINE YELLOW 08/28/2014 1122   APPEARANCEUR CLEAR 08/28/2014 1122    LABSPEC 1.015 08/28/2014 1122   PHURINE 6.0 08/28/2014 1122   GLUCOSEU NEGATIVE 08/28/2014 1122   HGBUR NEGATIVE 08/28/2014 1122   HGBUR negative 06/01/2008 1356   BILIRUBINUR NEGATIVE 08/28/2014 1122   KETONESUR NEGATIVE 08/28/2014 1122   PROTEINUR NEGATIVE 04/22/2014 1703   UROBILINOGEN 0.2 08/28/2014 1122   NITRITE NEGATIVE 08/28/2014 1122   LEUKOCYTESUR SMALL (A) 08/28/2014 1122   Sepsis Labs Invalid input(s): PROCALCITONIN,  WBC,  LACTICIDVEN Microbiology Recent Results (from the past 240 hour(s))  Respiratory Panel by PCR     Status: None   Collection Time: 01/25/18 11:53 AM  Result Value Ref Range Status   Adenovirus NOT DETECTED NOT DETECTED Final   Coronavirus 229E NOT DETECTED NOT DETECTED Final   Coronavirus HKU1 NOT DETECTED NOT DETECTED Final   Coronavirus NL63 NOT DETECTED NOT DETECTED Final   Coronavirus OC43 NOT DETECTED NOT DETECTED Final   Metapneumovirus NOT DETECTED NOT DETECTED Final   Rhinovirus / Enterovirus NOT DETECTED NOT DETECTED Final   Influenza A NOT DETECTED NOT DETECTED Final   Influenza B NOT DETECTED NOT DETECTED Final   Parainfluenza Virus 1 NOT DETECTED NOT DETECTED Final   Parainfluenza Virus 2 NOT DETECTED NOT DETECTED Final   Parainfluenza Virus 3 NOT DETECTED NOT DETECTED Final   Parainfluenza Virus 4 NOT DETECTED NOT DETECTED Final   Respiratory Syncytial Virus NOT DETECTED NOT DETECTED Final   Bordetella pertussis NOT DETECTED NOT DETECTED Final   Chlamydophila pneumoniae NOT DETECTED NOT DETECTED Final   Mycoplasma pneumoniae NOT DETECTED NOT DETECTED Final    Comment: Performed at [facility], [place] [address] [place], [place] [address]     Time coordinating discharge in minutes: 65  SIGNED:   Debbe Odea, MD  [facility] 01/28/2018, 10:23 AM Pager   If 7PM-7AM, please contact night-coverage [email or web address] Password [email or web address]

## 2018-01-28 NOTE — Care Management Note (Signed)
Case Management Note  Patient Details  Name: Kimberly Irwin MRN: [id number] Date of Birth: August 26, 1945  Subjective/Objective:                  HHC  Action/Plan: Well path Expected Discharge Date:  01/28/18               Expected Discharge Plan:  [place]  In-House Referral:     Discharge planning Services  CM Consult  Post Acute Care Choice:    Choice offered to:  Patient  DME Arranged:    DME Agency:     HH Arranged:  RN, PT, OT HH Agency:     Status of Service:  Completed, signed off  If discussed at [place] of Stay Meetings, dates discussed:    Additional Comments:  Leeroy Cha, RN 01/28/2018, 2:32 PM

## 2018-01-29 ENCOUNTER — Telehealth: Payer: Self-pay

## 2018-01-29 NOTE — Telephone Encounter (Signed)
01/29/18  Transition Care Management Follow-up Telephone Call  ADMISSION DATE: 6/23/189 DISCHARGE DATE: 01/28/18  How have you been since you were released from the hospital?  Patient states she has been groggy because of the medications she is taking.    Do you understand why you were in the hospital? Yes   Do you understand the discharge instrcutions? Yes    Items Reviewed:  Medications reviewed: Yes   Allergies reviewed: Yes  Dietary changes reviewed:Low sodium, Heart healthy Referrals reviewed:Patietn has follow up with [facility] and Pulmonologist Dr. Warner Mccreedy.  Functional Questionnaire:  Activities of Daily Living (ADLs): Patietn statees she can perform all.  Any patient concerns? None at this time per patient.   Confirmed importance and date/time of follow-up visits scheduled:Yes    Confirmed with patient if condition begins to worsen call PCP or go to the ER. Yes   Patient was given the office number and encouragred to call back with questions or concerns.Yes

## 2018-01-31 DIAGNOSIS — Z96642 Presence of left artificial hip joint: Secondary | ICD-10-CM | POA: Diagnosis not present

## 2018-01-31 DIAGNOSIS — J441 Chronic obstructive pulmonary disease with (acute) exacerbation: Secondary | ICD-10-CM | POA: Diagnosis not present

## 2018-01-31 DIAGNOSIS — Z8701 Personal history of pneumonia (recurrent): Secondary | ICD-10-CM | POA: Diagnosis not present

## 2018-01-31 DIAGNOSIS — I272 Pulmonary hypertension, unspecified: Secondary | ICD-10-CM | POA: Diagnosis not present

## 2018-01-31 DIAGNOSIS — D860 Sarcoidosis of lung: Secondary | ICD-10-CM | POA: Diagnosis not present

## 2018-01-31 DIAGNOSIS — E039 Hypothyroidism, unspecified: Secondary | ICD-10-CM | POA: Diagnosis not present

## 2018-01-31 DIAGNOSIS — Z87891 Personal history of nicotine dependence: Secondary | ICD-10-CM | POA: Diagnosis not present

## 2018-01-31 DIAGNOSIS — K219 Gastro-esophageal reflux disease without esophagitis: Secondary | ICD-10-CM | POA: Diagnosis not present

## 2018-01-31 DIAGNOSIS — F329 Major depressive disorder, single episode, unspecified: Secondary | ICD-10-CM | POA: Diagnosis not present

## 2018-01-31 DIAGNOSIS — E785 Hyperlipidemia, unspecified: Secondary | ICD-10-CM | POA: Diagnosis not present

## 2018-01-31 DIAGNOSIS — I10 Essential (primary) hypertension: Secondary | ICD-10-CM | POA: Diagnosis not present

## 2018-01-31 DIAGNOSIS — M5136 Other intervertebral disc degeneration, lumbar region: Secondary | ICD-10-CM | POA: Diagnosis not present

## 2018-01-31 DIAGNOSIS — Z9181 History of falling: Secondary | ICD-10-CM | POA: Diagnosis not present

## 2018-01-31 DIAGNOSIS — Z9981 Dependence on supplemental oxygen: Secondary | ICD-10-CM | POA: Diagnosis not present

## 2018-01-31 DIAGNOSIS — Z7982 Long term (current) use of aspirin: Secondary | ICD-10-CM | POA: Diagnosis not present

## 2018-01-31 DIAGNOSIS — G894 Chronic pain syndrome: Secondary | ICD-10-CM | POA: Diagnosis not present

## 2018-01-31 DIAGNOSIS — Z7951 Long term (current) use of inhaled steroids: Secondary | ICD-10-CM | POA: Diagnosis not present

## 2018-01-31 DIAGNOSIS — R251 Tremor, unspecified: Secondary | ICD-10-CM | POA: Diagnosis not present

## 2018-01-31 DIAGNOSIS — J9611 Chronic respiratory failure with hypoxia: Secondary | ICD-10-CM | POA: Diagnosis not present

## 2018-01-31 DIAGNOSIS — E669 Obesity, unspecified: Secondary | ICD-10-CM | POA: Diagnosis not present

## 2018-01-31 DIAGNOSIS — Z7952 Long term (current) use of systemic steroids: Secondary | ICD-10-CM | POA: Diagnosis not present

## 2018-02-01 ENCOUNTER — Telehealth: Payer: Self-pay | Admitting: Family Medicine

## 2018-02-01 NOTE — Telephone Encounter (Signed)
Copied from [place] [id number]. Topic: General - Other >> Feb 01, 2018 11:56 AM Cecelia Byars, NT wrote: Reason for CRM: Darl[facility] from [facility] called and would like verbal for nursing  1 to 2 x a week , occupational therapy and physical therapy will be doing a eval , please call her at [phone] ,ok to leave message

## 2018-02-02 DIAGNOSIS — G894 Chronic pain syndrome: Secondary | ICD-10-CM | POA: Diagnosis not present

## 2018-02-02 DIAGNOSIS — I10 Essential (primary) hypertension: Secondary | ICD-10-CM | POA: Diagnosis not present

## 2018-02-02 DIAGNOSIS — I272 Pulmonary hypertension, unspecified: Secondary | ICD-10-CM | POA: Diagnosis not present

## 2018-02-02 DIAGNOSIS — J441 Chronic obstructive pulmonary disease with (acute) exacerbation: Secondary | ICD-10-CM | POA: Diagnosis not present

## 2018-02-02 DIAGNOSIS — D860 Sarcoidosis of lung: Secondary | ICD-10-CM | POA: Diagnosis not present

## 2018-02-02 DIAGNOSIS — J9611 Chronic respiratory failure with hypoxia: Secondary | ICD-10-CM | POA: Diagnosis not present

## 2018-02-02 NOTE — Telephone Encounter (Signed)
Verbal orders given

## 2018-02-02 NOTE — Progress Notes (Signed)
_0  ID: Kimberly Irwin, female    DOB: [birth year], 72 y.o.   MRN: [id number]  Chief Complaint  Patient presents with  . Hospitalization Follow-up    States she is better but her mouth is sore, still has cough but improved. Sore throat, reports sorness throughout her sinus area under her eyes. Finished with prednisone and doxycycline. Symbicort daily and aluterol neb prn.     Referring provider: Mosie Lukes, MD  HPI: Synopsis: Former patient of Dr. Joya Gaskins and Dr. Corrie Dandy who has sarcoidosis and centrilobular emphysema.  Recent  Pulmonary Encounters:   12/10/17 - OV - Kimberly Irwin is still very short of breath.  She still very frustrated with having to carry oxygen everywhere.  She is extremely frustrated with the cost of her medicines.  She had a right heart catheterization that showed pulmonary hypertension.  She has not had new bronchitis or pneumonia symptoms.  She remains compliant with her oxygen therapy. Kimberly Irwin has worsening pulmonary hypertension despite no evidence of worsening of her sarcoidosis or centrilobular emphysema.  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  She needs to be on combination therapy.  She is frustrated with the cost of the medicines that have been prescribed.  We need to work with her to help make them more affordable.  Plan: Pulmonary hypertension: Start Letairis 5 mg daily Start Revatio 97m three times a day We are going to start you on a medication called Letairis (Ambrisentan) or your pulmonary hypertension. We will start the medication at 5 mg daily and if you are doing well we will increase it to 10 mg daily. We will not ensure your liver function tests every 2 weeks for the first month followed by monthly testing for the first 3 months. If those tests are okay check your liver function tests every 3 months as long as you take the medication. You should not take this medication if you are pregnant. We are also going to start a medicine  called Revatio.  Do not take this medicine with nitrates (nitroglycerine) Follow up in 4 weeks with an NP for blood work (liver function testing)     Tests:   CT chest October 2017 showed no PE . Mild emphysematous changes.  Images independently reviewed by me showing some air trapping and mosaicism.  Some cysts noted in the lower lobes. March 2019 high-resolution CT scan of the chest images independently reviewed, nonspecific interstitial changes in the right greater than left base which are very mild in distribution and overall severity, upper lobe predominant mild to moderate centrilobular emphysema noted, bilateral pulmonary nodules, largest 5 mm, aortic atherosclerosis noted 12/2017 V/Q Scan IMPRESSION: Normal perfusion lung scan. Minimally diminished ventilation to the RIGHT apex.  PFT: PFT (05/2016) Ratio 70% FEV1 1.59 72%, DLCO 35% (flow volume loop consistent with airflow obstruction) PFT (10/2017) ratio 71%, FEV1 1.83 L 84%, FVC 2.43 L 84%, total lung capacity 3.83 L 75%, DLCO 6.15 25%  Labs:  Path:  Echo: 2-D echo on 04/09/2016 showed an EF of 620-35% grade 1 diastolic dysfunction. Pulmonary artery pressure 44 mmHg  Heart Catheterization: 11/2017 RHC RA mean 2 RV 66/5 PA 66/16, mean 34 PCWP mean 3 Oxygen saturations: PA 60% AO 92% Cardiac Output (Fick) 3.73 Cardiac Index (Fick) 2.04 PVR 8.3 WU  Exhaled NO: FeNO is 20ppm 05/2016   Other: Stress Myoview 2011 neg for ischemia .      Chart Review:  01/24/18 - Hospitalization  >>>Discharged on 01/28/18  02/03/18 HFU  72 year old patient seen for HFU. Pt reporting she has been feeling much better since being discharged. Pt has finished doxycycline and prednisone but still having persisting sinus pain. Pt also reporting that shes very frustrated about her O2 needs and that her tanks are only lasting 45 minutes when out of the house. Pt presented today on 3L (pt is supposed to be on 4L) to conserve her tank and O2 sats  were 85 percent with exertion, with rest patient was able to recover to 92 percent. Pt refused to use our tank here office, preferring to use her own.  Pt does not want "to have to use large / heavy tanks" for O2  Pt and daughter reporting that pt checks oxygen saturations at home and O2 sats are remaining above 90 percent consistently. Pt doesn't think the symbicort is "working" and doesn't think that "it helps or makes my breathing better"  Pt is taking symbicort one time daily and not rinsing out her mouth. Pt reporting that she feels like her mouth has sores.      Allergies  Allergen Reactions  . Lipitor [Atorvastatin] Swelling  . Penicillins Swelling    Has patient had a PCN reaction causing immediate rash, facial/tongue/throat swelling, SOB or lightheadedness with hypotension: Yes Has patient had a PCN reaction causing severe rash involving mucus membranes or skin necrosis: No Has patient had a PCN reaction that required hospitalization: No Has patient had a PCN reaction occurring within the last 10 years: No If all of the above answers are "NO", then may proceed with Cephalosporin use.   . Metronidazole Swelling  . Pregabalin Other (See Comments)    REACTION: Somnolence and dizziness  . Simvastatin Other (See Comments)    Leg pain    Immunization History  Administered Date(s) Administered  . Influenza Split 05/08/2011, 05/26/2012  . Influenza Whole 06/03/2006, 05/31/2007, 04/26/2008, 05/23/2009, 05/20/2010  . Influenza, High Dose Seasonal PF 05/23/2013, 04/03/2016, 06/03/2017  . Influenza,inj,Quad PF,6+ Mos 04/27/2014, 06/15/2015  . Pneumococcal Conjugate-13 04/27/2014  . Pneumococcal Polysaccharide-23 06/18/2011, 07/03/2016  . Tdap 06/18/2011    Past Medical History:  Diagnosis Date  . Bursitis of left hip 10/26/2012  . Chronic low back pain   . Chronic respiratory failure with hypoxia ([place]) 10/21/2016  . Coronary artery calcification 07/03/2016  . DDD (degenerative  disc disease)    L3-4 with facet arthropathy and stenosis  . Degenerative spondylolisthesis    L4-5 grade 1 with stenosis  . Emphysema lung ([place])   . GERD (gastroesophageal reflux disease)   . Hiatal hernia   . Hyperlipidemia   . Hypertension   . Hypothyroidism 10/28/2015  . Pain in joint, lower leg 10/26/2012   left   . Pneumonia    as a child several times.   . Pulmonary hypertension ([place])   . Sarcoidosis   . Tremors of nervous system     Tobacco History: Social History   Tobacco Use  Smoking Status Former Smoker  . Packs/day: 0.50  . Years: 32.00  . Pack years: 16.00  . Types: Cigarettes  . Last attempt to quit: 07/22/1994  . Years since quitting: 23.5  Smokeless Tobacco Never Used   Counseling given: Yes Continue not smoking.   Outpatient Encounter Medications as of 02/03/2018  Medication Sig  . acetaminophen (TYLENOL) 500 MG tablet Take 1,000 mg by mouth every 6 (six) hours as needed for moderate pain.  Marland Kitchen albuterol (PROVENTIL HFA;VENTOLIN HFA) 108 (90 Base) MCG/ACT inhaler Inhale 2 puffs  into the lungs every 6 (six) hours as needed for wheezing or shortness of breath.  Marland Kitchen albuterol (PROVENTIL) (2.5 MG/3ML) 0.083% nebulizer solution Take 3 mLs (2.5 mg total) by nebulization every 6 (six) hours as needed for wheezing or shortness of breath.  Marland Kitchen aspirin EC 81 MG tablet Take 81 mg by mouth at bedtime.   Marland Kitchen atenolol (TENORMIN) 25 MG tablet Take 25 mg by mouth at bedtime.  Marland Kitchen atenolol (TENORMIN) 50 MG tablet TAKE 1 TABLET (50 MG TOTAL) BY MOUTH DAILY. (Patient taking differently: Take 50 mg by mouth every morning. )  . BESIVANCE 0.6 % SUSP Place 1 drop into the left eye See admin instructions. INSTILL 1 DROP INTO LEFT 4 TIMES DAILY x 2 DAYS AFTER EYE INJECTION  . budesonide-formoterol (SYMBICORT) 160-4.5 MCG/ACT inhaler Inhale 2 puffs into the lungs 2 (two) times daily.  . calcium carbonate (OSCAL) 1500 (600 Ca) MG TABS tablet Take 1,200 mg by mouth daily with breakfast.  .  chlorpheniramine-HYDROcodone (TUSSIONEX) 10-8 MG/5ML SUER Take 5 mLs by mouth every 12 (twelve) hours.  . Cholecalciferol (VITAMIN D) 2000 UNITS tablet Take 2,000 Units by mouth every evening.   . Cinnamon 500 MG capsule Take 2,000 mg by mouth daily.    . citalopram (CELEXA) 10 MG tablet Take 1 tablet (10 mg total) by mouth at bedtime.  . Coenzyme Q10 200 MG capsule Take 200 mg by mouth at bedtime.   . fexofenadine (ALLEGRA) 180 MG tablet Take 180 mg by mouth daily.  Marland Kitchen gabapentin (NEURONTIN) 300 MG capsule TAKE 1 CAPSULE (300 MG TOTAL) BY MOUTH AT BEDTIME.  Marland Kitchen Glucosamine HCl (GLUCOSAMINE PO) Take 1 tablet by mouth every evening.   Marland Kitchen guaiFENesin (MUCINEX) 600 MG 12 hr tablet Take 2 tablets (1,200 mg total) by mouth 2 (two) times daily.  Marland Kitchen ketoconazole (NIZORAL) 2 % shampoo Apply 1 application topically 2 (two) times a week. (Patient taking differently: Apply 1 application topically every 14 (fourteen) days. ONCE EVERY 2 WEEKS PER PATIENT)  . levothyroxine (SYNTHROID, LEVOTHROID) 112 MCG tablet TAKE 1 TABLET (112 MCG TOTAL) BY MOUTH DAILY BEFORE BREAKFAST.  . macitentan (OPSUMIT) 10 MG tablet Take 1 tablet (10 mg total) by mouth daily.  . Omega-3 Fatty Acids (FISH OIL) 1200 MG CAPS Take 1,200 mg by mouth daily.   Marland Kitchen omeprazole (PRILOSEC) 40 MG capsule TAKE 1 CAPSULE (40 MG TOTAL) BY MOUTH DAILY.  Marland Kitchen predniSONE (DELTASONE) 10 MG tablet Take 3 tablets (30 mg total) by mouth daily with breakfast. Take 30 mg x 2 days, 20 mg x 2 days, 10 mg x 2 days and 5 mg x 1 day  . rosuvastatin (CRESTOR) 20 MG tablet TAKE 1 TABLET (20 MG TOTAL) BY MOUTH DAILY. (Patient taking differently: Take 20 mg by mouth at bedtime. )  . Spacer/Aero-Holding Chambers (AEROCHAMBER PLUS WITH MASK) inhaler Use as instructed  . tadalafil, PAH, (ADCIRCA) 20 MG tablet Take 1 tablet (20 mg total) by mouth daily.  . traMADol (ULTRAM) 50 MG tablet Take 2 tablets (100 mg total) by mouth every 6 (six) hours as needed for moderate pain or severe  pain.  . vitamin B-12 (CYANOCOBALAMIN) 1000 MCG tablet Take 1,000 mcg by mouth daily.  . [DISCONTINUED] doxycycline (VIBRA-TABS) 100 MG tablet Take 1 tablet (100 mg total) by mouth every 12 (twelve) hours.  Marland Kitchen levofloxacin (LEVAQUIN) 500 MG tablet Take 1 tablet (500 mg total) by mouth daily.  Marland Kitchen nystatin (MYCOSTATIN) 100000 UNIT/ML suspension Take 5 mLs (500,000 Units total) by mouth  4 (four) times daily.   No facility-administered encounter medications on file as of 02/03/2018.      Review of Systems  Constitutional:  +fatigue, insomnia/ poor sleep      No  weight loss, night sweats,  fevers, chills HEENT: +HA, nasal congestion, sinus congestion   No Difficulty swallowing,  Tooth/dental problems, or  Sore throat, No sneezing, itching, ear ache  CV: No chest pain,  orthopnea, PND, swelling in lower extremities, anasarca, dizziness, palpitations, syncope  GI: No heartburn, indigestion, abdominal pain, nausea, vomiting, diarrhea, change in bowel habits, loss of appetite, bloody stools Resp: +sob with exertion and at rest    No excess mucus, no productive cough,  No non-productive cough,  No coughing up of blood.  No change in color of mucus.  No wheezing.  No chest wall deformity Skin: no rash, lesions, no skin changes. GU: no dysuria, change in color of urine, no urgency or frequency.  No flank pain, no hematuria  MS:  No joint pain or swelling.  No decreased range of motion.  No back pain. Psych:  No change in mood or affect. No depression or anxiety.  No memory loss.   Physical Exam  BP 110/70   Pulse (!) 50   Ht _0  (1.6 m)   Wt 173 lb 6.4 oz (78.7 kg)   SpO2 92%   BMI 30.72 kg/m   Wt Readings from Last 3 Encounters:  02/03/18 173 lb 6.4 oz (78.7 kg)  01/26/18 168 lb 6.9 oz (76.4 kg)  01/11/18 169 lb 6.4 oz (76.8 kg)     GEN: A/Ox3; pleasant , NAD, well nourished, on 4L [place]    HEENT:  /AT,  EACs-clear, TMs-wnl, NOSE-clear, THROAT-+post nasal drip Sinus - +tender palpation  of left maxially and frontal   NECK:  Supple w/ fair ROM; no JVD;  no lymphadenopathy.    RESP:  Clear  P & A; diminished in bases  w/o, wheezes/ rales/ or rhonchi. no accessory muscle use, no dullness to percussion  CARD:  RRR, no m/r/g, no peripheral edema, pulses intact, no cyanosis or clubbing.  GI:   Soft & nt; nml bowel sounds; no organomegaly or masses detected.   Musco: Warm bil, no deformities or joint swelling noted. Ambulates room    Neuro: alert, no focal deficits noted.    Skin: Warm, no lesions or rashes    Lab Results:  CBC    Component Value Date/Time   WBC 14.0 (H) 01/27/2018 0618   RBC 4.55 01/27/2018 0618   HGB 13.7 01/27/2018 0618   HCT 42.0 01/27/2018 0618   PLT 205 01/27/2018 0618   MCV 92.3 01/27/2018 0618   MCH 30.1 01/27/2018 0618   MCHC 32.6 01/27/2018 0618   RDW 13.9 01/27/2018 0618   LYMPHSABS 1.2 01/27/2018 0618   MONOABS 1.2 (H) 01/27/2018 0618   EOSABS 0.0 01/27/2018 0618   BASOSABS 0.0 01/27/2018 0618    BMET    Component Value Date/Time   NA 141 01/27/2018 0618   K 4.0 01/27/2018 0618   CL 105 01/27/2018 0618   CO2 27 01/27/2018 0618   GLUCOSE 119 (H) 01/27/2018 0618   BUN 25 (H) 01/27/2018 0618   CREATININE 1.03 (H) 01/27/2018 0618   CREATININE 0.91 12/09/2013 1100   CALCIUM 9.2 01/27/2018 0618   GFRNONAA 53 (L) 01/27/2018 0618   GFRNONAA >60 03/19/2011 1015   GFRAA >60 01/27/2018 0618   GFRAA >60 03/19/2011 1015    BNP  Component Value Date/Time   BNP 566.9 (H) 01/24/2018 2149    ProBNP No results found for: PROBNP  Imaging: Dg Chest Port 1 View  Result Date: 01/27/2018 CLINICAL DATA:  Short of breath EXAM: PORTABLE CHEST 1 VIEW COMPARISON:  01/26/2018 FINDINGS: Moderate cardiomegaly. Mild vascular congestion without pulmonary edema. Bibasilar atelectasis. Low lung volumes. No pneumothorax. IMPRESSION: Mild vascular congestion.  No evidence of pulmonary edema. Electronically Signed   By: Marybelle Killings M.D.   On:  01/27/2018 06:47   Dg Chest Port 1 View  Result Date: 01/26/2018 CLINICAL DATA:  Shortness of breath EXAM: PORTABLE CHEST 1 VIEW COMPARISON:  01/25/2018 FINDINGS: Cardiac shadow remains enlarged. The lungs are well aerated bilaterally. No focal infiltrate or sizable effusion is seen. No bony abnormality is noted. IMPRESSION: No active disease. Electronically Signed   By: Inez Catalina M.D.   On: 01/26/2018 08:00   Dg Chest Port 1 View  Result Date: 01/25/2018 CLINICAL DATA:  Shortness of breath and hypoxia EXAM: PORTABLE CHEST 1 VIEW COMPARISON:  01/24/2018 FINDINGS: Cardiac shadow is stable. Aortic calcifications are again seen. The lungs are well aerated bilaterally. No focal infiltrate or sizable effusion is seen. No bony abnormality is seen. IMPRESSION: No acute abnormality noted. Electronically Signed   By: Inez Catalina M.D.   On: 01/25/2018 09:56   Dg Chest Port 1 View  Result Date: 01/24/2018 CLINICAL DATA:  Dyspnea and hypoxia with cough. EXAM: PORTABLE CHEST 1 VIEW COMPARISON:  12/09/2017 FINDINGS: Cardiomegaly with increased interstitial lung markings and peribronchial thickening compatible with chronic bronchitic change. No alveolar consolidation, CHF, effusion or pneumothorax. Aortic atherosclerosis is noted near the arch. No aneurysm. No acute osseous abnormality. IMPRESSION: Stable cardiomegaly with aortic atherosclerosis. Chronic diffuse bronchitic change of the lungs. Electronically Signed   By: Ashley Royalty M.D.   On: 01/24/2018 22:00     Assessment & Plan:   This was a difficult visit for the patient today. Pt is quite frustrated with her limitations and high oxygen needs. Pt wanted to be walked again in the office today for a POC. Walked in office and sats were 85 percent on 5L, with a slow paced walk. Pt is quite tearful and frustrated with me that I stated that a POC would not be possible. I explained to the patient we have to maintain O2 sats about 88-90 percent with exertion  and unfortunately she cannot due to her progression of PH.   Pt to schedule 6 week follow up with [facility] at [facility] office.   Will treat sinusitis one more time with Levaquin. As the infection seems to persist. Unfortuantely pt has an allergy to augmentin.   Unfortunately patient was not taking Symbicort as prescribed, and also was not rinsing her mouth. Now pt has developed thrush. Will treat with nystatin. Explained how to correctly take symbicort.   Pt has follow up with PCP and Cards for HFU as well.   Pulmonary HTN ([place]) Walk today in office >>> Unable to maintain sats when walking 5 L pulsed   Follow-up in 6 weeks with Dr. Lake Bells  Continue follow-up with cardiology, and primary care   Acute on chronic respiratory failure with hypoxia ([place]) Walk today in office >>> Unable to maintain sats when walking 5 L pulsed   Continue O2 therapy as prescribed   Follow-up in 6 weeks with Dr. Lake Bells  Continue follow-up with cardiology, and primary care   Sinusitis Walk today in office >>> Unable to maintain sats when walking 5  L pulsed   Levaquin  >>> Take 1 tablet (500 mg total) daily for the next 5 days >>> Week with food   Follow-up in 6 weeks with Dr. Lake Bells  Continue follow-up with cardiology, and primary care   Thrush  Nystatin rinse take 5 mL's by mouth 4 times daily swish around her mouth and swallow  Continue Symbicort >>> 2 puffs in the morning right when you wake up, rinse out your mouth after use, 12 hours later 2 puffs, rinse after use >>> Take this daily, no matter what >>> This is not a rescue inhaler    Follow-up in 6 weeks with Dr. Lake Bells   This appointment was 45 minutes long with over 50 percent of that time in direct face to face, patient care, plan of care discussion.    Lauraine Rinne, NP 02/03/2018     Addendum:   Antibiotic switched to Doxycycline due to pt taking citalopram and Levaquin potentially prolonging QTC. Order placed.

## 2018-02-03 ENCOUNTER — Encounter: Payer: Self-pay | Admitting: Pulmonary Disease

## 2018-02-03 ENCOUNTER — Ambulatory Visit (INDEPENDENT_AMBULATORY_CARE_PROVIDER_SITE_OTHER): Payer: Medicare Other | Admitting: Pulmonary Disease

## 2018-02-03 ENCOUNTER — Telehealth: Payer: Self-pay | Admitting: Pulmonary Disease

## 2018-02-03 VITALS — BP 110/70 | HR 50 | Ht 63.0 in | Wt 173.4 lb

## 2018-02-03 DIAGNOSIS — J0110 Acute frontal sinusitis, unspecified: Secondary | ICD-10-CM | POA: Diagnosis not present

## 2018-02-03 DIAGNOSIS — I272 Pulmonary hypertension, unspecified: Secondary | ICD-10-CM

## 2018-02-03 DIAGNOSIS — E669 Obesity, unspecified: Secondary | ICD-10-CM | POA: Diagnosis not present

## 2018-02-03 DIAGNOSIS — J9621 Acute and chronic respiratory failure with hypoxia: Secondary | ICD-10-CM | POA: Diagnosis not present

## 2018-02-03 DIAGNOSIS — B370 Candidal stomatitis: Secondary | ICD-10-CM | POA: Diagnosis not present

## 2018-02-03 DIAGNOSIS — Z6830 Body mass index (BMI) 30.0-30.9, adult: Secondary | ICD-10-CM | POA: Diagnosis not present

## 2018-02-03 MED ORDER — NYSTATIN 100000 UNIT/ML MT SUSP
5.0000 mL | Freq: Four times a day (QID) | OROMUCOSAL | 0 refills | Status: DC
Start: 2018-02-03 — End: 2019-06-13

## 2018-02-03 MED ORDER — LEVOFLOXACIN 500 MG PO TABS: 500.0000 mg | ORAL_TABLET | Freq: Every day | ORAL | 0 refills | Status: DC

## 2018-02-03 MED ORDER — DOXYCYCLINE HYCLATE 100 MG PO TABS: 100.0000 mg | ORAL_TABLET | Freq: Two times a day (BID) | ORAL | 0 refills | Status: DC

## 2018-02-03 NOTE — Telephone Encounter (Signed)
Noted.  Patient that we can switch order to doxycycline.  This will replace Levaquin.   Doxycycline >>> 1 100 mg tablet every 12 hours for 7 days >>>take with food  >>>wear sunscreen    Please place the order.     Thank you, Wyn Quaker FNP

## 2018-02-03 NOTE — Telephone Encounter (Signed)
Ok Rx sent in.and pharmacist advised. I called pt to let her know as well. Pt understood and nothing further is needed.

## 2018-02-03 NOTE — Assessment & Plan Note (Signed)
Walk today in office >>> Unable to maintain sats when walking 5 L pulsed   Continue O2 therapy as prescribed   Follow-up in 6 weeks with Dr. Lake Bells  Continue follow-up with cardiology, and primary care

## 2018-02-03 NOTE — Patient Instructions (Addendum)
Walk today in office >>> Unable to maintain sats when walking 5 L pulsed   Levaquin  >>> Take 1 tablet (500 mg total) daily for the next 5 days >>> Week with food  Nystatin rinse take 5 mL's by mouth 4 times daily swish around her mouth and swallow  Continue Symbicort >>> 2 puffs in the morning right when you wake up, rinse out your mouth after use, 12 hours later 2 puffs, rinse after use >>> Take this daily, no matter what >>> This is not a rescue inhaler    Follow-up in 6 weeks with Dr. Lake Bells  Continue follow-up with cardiology, and primary care      Please contact the office if your symptoms worsen or you have concerns that you are not improving.   Thank you for choosing [facility] for your healthcare, and for allowing [place] to partner with you on your healthcare journey. I am thankful to be able to provide care to you today.   Wyn Quaker FNP-C

## 2018-02-03 NOTE — Assessment & Plan Note (Signed)
  Nystatin rinse take 5 mL's by mouth 4 times daily swish around her mouth and swallow  Continue Symbicort >>> 2 puffs in the morning right when you wake up, rinse out your mouth after use, 12 hours later 2 puffs, rinse after use >>> Take this daily, no matter what >>> This is not a rescue inhaler    Follow-up in 6 weeks with Dr. Lake Bells

## 2018-02-03 NOTE — Assessment & Plan Note (Signed)
Walk today in office >>> Unable to maintain sats when walking 5 L pulsed   Levaquin  >>> Take 1 tablet (500 mg total) daily for the next 5 days >>> Week with food   Follow-up in 6 weeks with Dr. Lake Bells  Continue follow-up with cardiology, and primary care

## 2018-02-03 NOTE — Telephone Encounter (Signed)
Spoke with pharmacist, she states citalopram and Levaquin together can cause prolonged QT. She just wanted you to be aware of the interaction and to see if you wanted to prescribe something else. Aaron Edelman please advise. You saw the pt today.

## 2018-02-03 NOTE — Assessment & Plan Note (Signed)
Walk today in office >>> Unable to maintain sats when walking 5 L pulsed   Follow-up in 6 weeks with Dr. Lake Bells  Continue follow-up with cardiology, and primary care

## 2018-02-05 ENCOUNTER — Telehealth: Payer: Self-pay | Admitting: Pulmonary Disease

## 2018-02-05 ENCOUNTER — Telehealth: Payer: Self-pay

## 2018-02-05 NOTE — Progress Notes (Signed)
Reviewed, agree 

## 2018-02-05 NOTE — Telephone Encounter (Signed)
Called and spoke with pt letting her know the stated recommendations per BQ  Pt expressed understanding. Nothing further needed.

## 2018-02-05 NOTE — Telephone Encounter (Signed)
Called and spoke with pt who stated she began to have swelling in feet that began yesterday, 7/5. Pt states she has also had a headache.  Pt states she feels like the problems she has been having are coming from the Opsumit.  Pt states Saturday, 7/6 will be exactly 1 week since she began taking the med.  Dr. Lake Bells, please advise on recommendations for pt. Thanks!

## 2018-02-05 NOTE — Telephone Encounter (Signed)
Received Physician Orders for [place] from [facility]; forwarded to provider/SLS 07/05

## 2018-02-05 NOTE — Telephone Encounter (Signed)
Copied from [place] [id number]. Topic: Inquiry >> Feb 01, 2018  2:34 PM Pricilla Handler wrote: Reason for CRM: Ana with [facility] [phone]) called requesting PT Verbal Orders. Wilhemena Durie has requested PT Twice a Week for 4 Weeks for [employer]. Patient is wanting to ween off of oxygen. Please call Ana with [facility] at [phone].       Thank You!!!    Please advise

## 2018-02-05 NOTE — Telephone Encounter (Signed)
Tylenol as needed for headache Ankle swelling is fairly common with medications like Opsumit  I think she really needs to be on these medicines to help her breathe better and potentially reduce the oxygen need, so I would not advise stopping them.

## 2018-02-06 DIAGNOSIS — G894 Chronic pain syndrome: Secondary | ICD-10-CM | POA: Diagnosis not present

## 2018-02-06 DIAGNOSIS — J441 Chronic obstructive pulmonary disease with (acute) exacerbation: Secondary | ICD-10-CM | POA: Diagnosis not present

## 2018-02-06 DIAGNOSIS — I10 Essential (primary) hypertension: Secondary | ICD-10-CM | POA: Diagnosis not present

## 2018-02-06 DIAGNOSIS — I272 Pulmonary hypertension, unspecified: Secondary | ICD-10-CM | POA: Diagnosis not present

## 2018-02-06 DIAGNOSIS — J9611 Chronic respiratory failure with hypoxia: Secondary | ICD-10-CM | POA: Diagnosis not present

## 2018-02-06 DIAGNOSIS — D860 Sarcoidosis of lung: Secondary | ICD-10-CM | POA: Diagnosis not present

## 2018-02-06 NOTE — Telephone Encounter (Signed)
OK to give verbal order for therapy as requested. If she wants to stop oxygen she will need to stop and then give home health an order to check Pulse Ox off of oxygen if she drops below 88% then she needs to restart the oxygen

## 2018-02-09 DIAGNOSIS — J9611 Chronic respiratory failure with hypoxia: Secondary | ICD-10-CM | POA: Diagnosis not present

## 2018-02-09 DIAGNOSIS — D860 Sarcoidosis of lung: Secondary | ICD-10-CM | POA: Diagnosis not present

## 2018-02-09 DIAGNOSIS — G894 Chronic pain syndrome: Secondary | ICD-10-CM | POA: Diagnosis not present

## 2018-02-09 DIAGNOSIS — I272 Pulmonary hypertension, unspecified: Secondary | ICD-10-CM | POA: Diagnosis not present

## 2018-02-09 DIAGNOSIS — I10 Essential (primary) hypertension: Secondary | ICD-10-CM | POA: Diagnosis not present

## 2018-02-09 DIAGNOSIS — J441 Chronic obstructive pulmonary disease with (acute) exacerbation: Secondary | ICD-10-CM | POA: Diagnosis not present

## 2018-02-09 NOTE — Telephone Encounter (Signed)
Verbal orders for PT and oxygen weaning from Dr. Charlett Blake relayed to [facility] from [place]. Ana states pt still wears 4L N/C, and stated that she will attempt to gradually wean her off as tolerated, not to drop below 88% saturation.

## 2018-02-10 DIAGNOSIS — G894 Chronic pain syndrome: Secondary | ICD-10-CM | POA: Diagnosis not present

## 2018-02-10 DIAGNOSIS — D860 Sarcoidosis of lung: Secondary | ICD-10-CM | POA: Diagnosis not present

## 2018-02-10 DIAGNOSIS — J9611 Chronic respiratory failure with hypoxia: Secondary | ICD-10-CM | POA: Diagnosis not present

## 2018-02-10 DIAGNOSIS — I10 Essential (primary) hypertension: Secondary | ICD-10-CM | POA: Diagnosis not present

## 2018-02-10 DIAGNOSIS — I272 Pulmonary hypertension, unspecified: Secondary | ICD-10-CM | POA: Diagnosis not present

## 2018-02-10 DIAGNOSIS — J441 Chronic obstructive pulmonary disease with (acute) exacerbation: Secondary | ICD-10-CM | POA: Diagnosis not present

## 2018-02-11 DIAGNOSIS — I272 Pulmonary hypertension, unspecified: Secondary | ICD-10-CM | POA: Diagnosis not present

## 2018-02-11 DIAGNOSIS — D860 Sarcoidosis of lung: Secondary | ICD-10-CM | POA: Diagnosis not present

## 2018-02-11 DIAGNOSIS — J9611 Chronic respiratory failure with hypoxia: Secondary | ICD-10-CM | POA: Diagnosis not present

## 2018-02-11 DIAGNOSIS — I10 Essential (primary) hypertension: Secondary | ICD-10-CM | POA: Diagnosis not present

## 2018-02-11 DIAGNOSIS — J441 Chronic obstructive pulmonary disease with (acute) exacerbation: Secondary | ICD-10-CM | POA: Diagnosis not present

## 2018-02-11 DIAGNOSIS — G894 Chronic pain syndrome: Secondary | ICD-10-CM | POA: Diagnosis not present

## 2018-02-12 ENCOUNTER — Encounter: Payer: Self-pay | Admitting: Internal Medicine

## 2018-02-12 ENCOUNTER — Ambulatory Visit (INDEPENDENT_AMBULATORY_CARE_PROVIDER_SITE_OTHER): Payer: Medicare Other | Admitting: Internal Medicine

## 2018-02-12 VITALS — BP 124/62 | HR 58 | Temp 97.6°F | Resp 18 | Ht 63.0 in | Wt 174.4 lb

## 2018-02-12 DIAGNOSIS — G894 Chronic pain syndrome: Secondary | ICD-10-CM | POA: Diagnosis not present

## 2018-02-12 DIAGNOSIS — I10 Essential (primary) hypertension: Secondary | ICD-10-CM | POA: Diagnosis not present

## 2018-02-12 DIAGNOSIS — J441 Chronic obstructive pulmonary disease with (acute) exacerbation: Secondary | ICD-10-CM | POA: Diagnosis not present

## 2018-02-12 DIAGNOSIS — D860 Sarcoidosis of lung: Secondary | ICD-10-CM | POA: Diagnosis not present

## 2018-02-12 DIAGNOSIS — J9621 Acute and chronic respiratory failure with hypoxia: Secondary | ICD-10-CM

## 2018-02-12 DIAGNOSIS — J9611 Chronic respiratory failure with hypoxia: Secondary | ICD-10-CM | POA: Diagnosis not present

## 2018-02-12 DIAGNOSIS — I272 Pulmonary hypertension, unspecified: Secondary | ICD-10-CM | POA: Diagnosis not present

## 2018-02-12 NOTE — Progress Notes (Signed)
Subjective:    Patient ID: Kimberly Irwin, female    DOB: Sep 28, 1945, 72 y.o.   MRN: [id number]  DOS:  02/12/2018 Type of visit - description : TCM 14 Interval history: Admitted to the hospital for 4 days, discharged 01/28/2018 Patient is a 72 year old female with COPD, chronic respiratory failure, on O2 4 L at home, chronic pain, hypertension, sarcoidosis. She was admitted with productive cough, S OB. No chest pain. Was admitted with COPD exacerbation and acute on chronic respiratory failure. Was seen by pulmonary, was treated with nebulization, doxy and steroids, transition to p.o. Prednisone.  Subsequently, was seen at the pulmonary office 02/03/2018, at the time O2 sats were 85% on 5 L. She was very frustrated. She was treated with another round of doxycycline (could not get Levaquin d/t interactions)  for sinus infection She was dx w/ thrush, treated with nystatin. She was recommended to go back to them in 6 weeks  Here for follow-up.   Review of Systems  At this point she is somewhat better. She has a PT and a nurse visiting her every other day. Good compliance with Symbicort. Off antibiotics, prednisone and hydrocodone. O2 sats at home in the 90s at rest, in the 80s with exertion. Was told by the nurse that she had a low-grade fever, afebrile today. Occasional chills. Cough is dry, not severe. Had a hard time using a statin. Mild lower extremity edema noted by the patient, felt to be due to medications.   Past Medical History:  Diagnosis Date  . Bursitis of left hip 10/26/2012  . Chronic low back pain   . Chronic respiratory failure with hypoxia ([place]) 10/21/2016  . Coronary artery calcification 07/03/2016  . DDD (degenerative disc disease)    L3-4 with facet arthropathy and stenosis  . Degenerative spondylolisthesis    L4-5 grade 1 with stenosis  . Emphysema lung ([place])   . GERD (gastroesophageal reflux disease)   . Hiatal hernia   . Hyperlipidemia   . Hypertension     . Hypothyroidism 10/28/2015  . Pain in joint, lower leg 10/26/2012   left   . Pneumonia    as a child several times.   . Pulmonary hypertension ([place])   . Sarcoidosis   . Tremors of nervous system     Past Surgical History:  Procedure Laterality Date  . ANTERIOR LAT LUMBAR FUSION Left 08/29/2014   Procedure: EXTREME LEFT LATERAL INTERBODY FUSION LUMBAR TWO-THREE LATERAL PLATE;  Surgeon: Charlie Pitter, MD;  Location: [facility];  Service: Neurosurgery;  Laterality: Left;  . BACK SURGERY  01/15/09   L3-4 and L4-5 decompressive laminectomy with bilateral L3, L4, and L5 decompressive foraminotomies, more than it would be required for simple interbody fusion alone.  Marland Kitchen BACK SURGERY  01/15/09   L3-4 and L4-5 posterior lumbar interbody fusion utilizing tanget interbody allograft wedge, Telamon interbody PEEk cage, and local autografting.  Marland Kitchen BACK SURGERY  01/15/09   L3, L4, and L5 posterolateral arthrodesis using segmental pedicle screw fixation and local autografting.  Marland Kitchen CARDIAC CATHETERIZATION    . CARPAL TUNNEL RELEASE Bilateral   . COLONOSCOPY    . ESOPHAGOGASTRODUODENOSCOPY    . EYE SURGERY     lens implant  . JOINT REPLACEMENT  09/13/09   Right Hip, Dr. Alvan Dame  . KYPHOPLASTY N/A 09/14/2014   Procedure: Lumbar Two Kyphoplasty/Vertebroplasty;  Surgeon: Charlie Pitter, MD;  Location: [facility];  Service: Neurosurgery;  Laterality: N/A;  Lumbar Two Kyphoplasty/Vertebroplasty  . RIGHT  HEART CATH N/A 11/19/2017   Procedure: RIGHT HEART CATH;  Surgeon: Larey Dresser, MD;  Location: [facility];  Service: Cardiovascular;  Laterality: N/A;  . THYROIDECTOMY    . TOTAL HIP ARTHROPLASTY Left 06/28/2013   Procedure: LEFT TOTAL  HIP ARTHROPLASTY ANTERIOR APPROACH;  Surgeon: Mauri Pole, MD;  Location: [facility];  Service: Orthopedics;  Laterality: Left;    Social History   Socioeconomic History  . Marital status: Widowed    Spouse name: Not on file  . Number of children: 2  . Years of  education: Not on file  . Highest education level: Not on file  Occupational History  . Not on file  Social Needs  . Financial resource strain: Not on file  . Food insecurity:    Worry: Not on file    Inability: Not on file  . Transportation needs:    Medical: Not on file    Non-medical: Not on file  Tobacco Use  . Smoking status: Former Smoker    Packs/day: 0.50    Years: 32.00    Pack years: 16.00    Types: Cigarettes    Last attempt to quit: 07/22/1994    Years since quitting: 23.5  . Smokeless tobacco: Never Used  Substance and Sexual Activity  . Alcohol use: No    Alcohol/week: 0.0 oz  . Drug use: No  . Sexual activity: Not Currently  Lifestyle  . Physical activity:    Days per week: Not on file    Minutes per session: Not on file  . Stress: Not on file  Relationships  . Social connections:    Talks on phone: Not on file    Gets together: Not on file    Attends religious service: Not on file    Active member of club or organization: Not on file    Attends meetings of clubs or organizations: Not on file    Relationship status: Not on file  . Intimate partner violence:    Fear of current or ex partner: Not on file    Emotionally abused: Not on file    Physically abused: Not on file    Forced sexual activity: Not on file  Other Topics Concern  . Not on file  Social History Narrative   Married 73 years and has 2 children (2 daughters)   Alcohol Use - no   Former Smoker - she quit 10 years ago, started when she was 87 and has had varying level of use of tobacco products from 1/2 pack to 3 packs per day.      Allergies as of 02/12/2018      Reactions   Lipitor [atorvastatin] Swelling   Penicillins Swelling   Has patient had a PCN reaction causing immediate rash, facial/tongue/throat swelling, SOB or lightheadedness with hypotension: Yes Has patient had a PCN reaction causing severe rash involving mucus membranes or skin necrosis: No Has patient had a PCN  reaction that required hospitalization: No Has patient had a PCN reaction occurring within the last 10 years: No If all of the above answers are "NO", then may proceed with Cephalosporin use.   Metronidazole Swelling   Pregabalin Other (See Comments)   REACTION: Somnolence and dizziness   Simvastatin Other (See Comments)   Leg pain      Medication List        Accurate as of 02/12/18 11:59 PM. Always use your most recent med list.  acetaminophen 500 MG tablet Commonly known as:  TYLENOL Take 1,000 mg by mouth every 6 (six) hours as needed for moderate pain.   aerochamber plus with mask inhaler Use as instructed   albuterol 108 (90 Base) MCG/ACT inhaler Commonly known as:  PROVENTIL HFA;VENTOLIN HFA Inhale 2 puffs into the lungs every 6 (six) hours as needed for wheezing or shortness of breath.   albuterol (2.5 MG/3ML) 0.083% nebulizer solution Commonly known as:  PROVENTIL Take 3 mLs (2.5 mg total) by nebulization every 6 (six) hours as needed for wheezing or shortness of breath.   aspirin EC 81 MG tablet Take 81 mg by mouth at bedtime.   atenolol 25 MG tablet Commonly known as:  TENORMIN Take 25 mg by mouth at bedtime.   atenolol 50 MG tablet Commonly known as:  TENORMIN TAKE 1 TABLET (50 MG TOTAL) BY MOUTH DAILY.   BESIVANCE 0.6 % Susp Generic drug:  Besifloxacin HCl Place 1 drop into the left eye See admin instructions. INSTILL 1 DROP INTO LEFT 4 TIMES DAILY x 2 DAYS AFTER EYE INJECTION   budesonide-formoterol 160-4.5 MCG/ACT inhaler Commonly known as:  SYMBICORT Inhale 2 puffs into the lungs 2 (two) times daily.   calcium carbonate 1500 (600 Ca) MG Tabs tablet Commonly known as:  OSCAL Take 1,200 mg by mouth daily with breakfast.   Cinnamon 500 MG capsule Take 2,000 mg by mouth daily.   citalopram 10 MG tablet Commonly known as:  CELEXA Take 1 tablet (10 mg total) by mouth at bedtime.   Coenzyme Q10 200 MG capsule Take 200 mg by mouth at  bedtime.   fexofenadine 180 MG tablet Commonly known as:  ALLEGRA Take 180 mg by mouth daily.   Fish Oil 1200 MG Caps Take 1,200 mg by mouth daily.   gabapentin 300 MG capsule Commonly known as:  NEURONTIN TAKE 1 CAPSULE (300 MG TOTAL) BY MOUTH AT BEDTIME.   GLUCOSAMINE PO Take 1 tablet by mouth every evening.   guaiFENesin 600 MG 12 hr tablet Commonly known as:  MUCINEX Take 2 tablets (1,200 mg total) by mouth 2 (two) times daily.   ketoconazole 2 % shampoo Commonly known as:  NIZORAL Apply 1 application topically 2 (two) times a week.   levothyroxine 112 MCG tablet Commonly known as:  SYNTHROID, LEVOTHROID TAKE 1 TABLET (112 MCG TOTAL) BY MOUTH DAILY BEFORE BREAKFAST.   macitentan 10 MG tablet Commonly known as:  OPSUMIT Take 1 tablet (10 mg total) by mouth daily.   nystatin 100000 UNIT/ML suspension Commonly known as:  MYCOSTATIN Take 5 mLs (500,000 Units total) by mouth 4 (four) times daily.   omeprazole 40 MG capsule Commonly known as:  PRILOSEC TAKE 1 CAPSULE (40 MG TOTAL) BY MOUTH DAILY.   rosuvastatin 20 MG tablet Commonly known as:  CRESTOR TAKE 1 TABLET (20 MG TOTAL) BY MOUTH DAILY.   tadalafil (PAH) 20 MG tablet Commonly known as:  ADCIRCA Take 1 tablet (20 mg total) by mouth daily.   traMADol 50 MG tablet Commonly known as:  ULTRAM Take 2 tablets (100 mg total) by mouth every 6 (six) hours as needed for moderate pain or severe pain.   vitamin B-12 1000 MCG tablet Commonly known as:  CYANOCOBALAMIN Take 1,000 mcg by mouth daily.   Vitamin D 2000 units tablet Take 2,000 Units by mouth every evening.          Objective:   Physical Exam BP 124/62 (BP Location: Left Arm, Patient Position: Sitting, Cuff Size:  Small)   Pulse (!) 58   Temp 97.6 F (36.4 C) (Oral)   Resp 18   Ht _0  (1.6 m)   Wt 174 lb 6 oz (79.1 kg)   SpO2 91%   BMI 30.89 kg/m  General:   Well developed, NAD, see BMI.  HEENT:  Normocephalic . Face symmetric,  atraumatic Lungs:  Creased breath sounds but clear.  On oxygen, no distress at rest, mild distress when she walks. O2 sat 91% on 6 L when she walked into the room. Heart: RRR,  no murmur.  Trace peri-ankle and distal lower extremity edema skin: Not pale. Not jaundice Neurologic:  alert & oriented X3.  Speech normal, gait appropriate for age and unassisted.  Head tremors noted. Psych--  Cognition and judgment appear intact.  Cooperative with normal attention span and concentration.  Behavior appropriate. No anxious or depressed appearing.      Assessment & Plan:   Acute on chronic respiratory failure: Status post admission, subsequently seen by pulmonary. She finished her antibiotics, prednisone and hydrocodone.  Does not feel she needs more hydrocodone at this point. Her hypoxia is slightly worse than baseline, usually is okay with 4 L of oxygen, currently at home using 4.5 L keeping her oxygen in the 90s at rest but is still dropping to the 80s with exertion. She was diagnosed with thrush, was prescribed nystatin which she has a hard time using.  On exam today there is no thrush.  For completeness, I recommend to switch & spit nystatin as recommended, her main problem is swallowing it.  She reports takes good care of her denture, cleans it daily. Plan: Continue with good compliance with all medications including  Symbicort and rinsing after using it. Nystatin for few more days Call pulmonary or this office if problems See PCP in 4 to 6 weeks

## 2018-02-12 NOTE — Patient Instructions (Signed)
  GO TO THE FRONT DESK Schedule your next appointment for a  Check with Dr Charlett Blake in 4-6 weeks

## 2018-02-12 NOTE — Progress Notes (Signed)
Pre visit review using our clinic review tool, if applicable. No additional management support is needed unless otherwise documented below in the visit note.

## 2018-02-15 ENCOUNTER — Telehealth: Payer: Self-pay | Admitting: Pulmonary Disease

## 2018-02-15 DIAGNOSIS — J9611 Chronic respiratory failure with hypoxia: Secondary | ICD-10-CM | POA: Diagnosis not present

## 2018-02-15 DIAGNOSIS — J439 Emphysema, unspecified: Secondary | ICD-10-CM

## 2018-02-15 DIAGNOSIS — G894 Chronic pain syndrome: Secondary | ICD-10-CM | POA: Diagnosis not present

## 2018-02-15 DIAGNOSIS — J441 Chronic obstructive pulmonary disease with (acute) exacerbation: Secondary | ICD-10-CM | POA: Diagnosis not present

## 2018-02-15 DIAGNOSIS — D860 Sarcoidosis of lung: Secondary | ICD-10-CM | POA: Diagnosis not present

## 2018-02-15 DIAGNOSIS — I272 Pulmonary hypertension, unspecified: Secondary | ICD-10-CM | POA: Diagnosis not present

## 2018-02-15 DIAGNOSIS — I10 Essential (primary) hypertension: Secondary | ICD-10-CM | POA: Diagnosis not present

## 2018-02-15 NOTE — Telephone Encounter (Signed)
Order has been placed. Patient has been made aware that she will be contacted by [facility]. Nothing further needed.

## 2018-02-15 NOTE — Telephone Encounter (Signed)
Called and spoke with patient, she states that the compressor that she has now only goes up to 5. She is on 4.5 liters right now and the compressor she has is not big enough to fill up her tanks. She is asking that BQ send an order for a compressor that goes up to 10.    BQ please advise, thank you.

## 2018-02-15 NOTE — Telephone Encounter (Signed)
OK 

## 2018-02-16 ENCOUNTER — Telehealth: Payer: Self-pay | Admitting: Pulmonary Disease

## 2018-02-16 ENCOUNTER — Telehealth: Payer: Self-pay | Admitting: *Deleted

## 2018-02-16 DIAGNOSIS — J441 Chronic obstructive pulmonary disease with (acute) exacerbation: Secondary | ICD-10-CM | POA: Diagnosis not present

## 2018-02-16 DIAGNOSIS — J9611 Chronic respiratory failure with hypoxia: Secondary | ICD-10-CM | POA: Diagnosis not present

## 2018-02-16 DIAGNOSIS — I10 Essential (primary) hypertension: Secondary | ICD-10-CM | POA: Diagnosis not present

## 2018-02-16 DIAGNOSIS — G894 Chronic pain syndrome: Secondary | ICD-10-CM | POA: Diagnosis not present

## 2018-02-16 DIAGNOSIS — I272 Pulmonary hypertension, unspecified: Secondary | ICD-10-CM | POA: Diagnosis not present

## 2018-02-16 DIAGNOSIS — D860 Sarcoidosis of lung: Secondary | ICD-10-CM | POA: Diagnosis not present

## 2018-02-16 NOTE — Telephone Encounter (Signed)
Received Home Health Certification and Plan of Care; forwarded to provider/SLS 07/16  Received Physician Orders from [place]; forwarded to provider/SLS 07/16

## 2018-02-16 NOTE — Telephone Encounter (Signed)
Called and spoke with pt regarding concerns on new O2 order sent to [facility] Pt states that [facility] has not rec'd order on 02/15/18 to [facility] for increase of O2 to 10 liter compressor. Looked into the referrals and it was completed on 02/15/18 to [facility] spoke with Ailene Ravel with [facility] [phone] She reported that they have not seen and orders on there side for increase in pt's O2 to 10 liters Sandra Cockayne that the referral was sent in 02/15/18 to [facility] She is having Corene Cornea look into this matter further and call our office back.  The referral has message holding for physician signature- it was signed by BQ 02/15/18 in evening. Angelica Chessman in [facility] of this info; she resent order with signature to [facility] lvm for Corene Cornea with [facility] [phone] with above info  Called and spoke with pt letting her know that [facility] has the order, it is signed and [facility] will call them today. Nothing further needed at this time.

## 2018-02-19 DIAGNOSIS — I10 Essential (primary) hypertension: Secondary | ICD-10-CM | POA: Diagnosis not present

## 2018-02-19 DIAGNOSIS — J441 Chronic obstructive pulmonary disease with (acute) exacerbation: Secondary | ICD-10-CM | POA: Diagnosis not present

## 2018-02-19 DIAGNOSIS — J9611 Chronic respiratory failure with hypoxia: Secondary | ICD-10-CM | POA: Diagnosis not present

## 2018-02-19 DIAGNOSIS — I272 Pulmonary hypertension, unspecified: Secondary | ICD-10-CM | POA: Diagnosis not present

## 2018-02-19 DIAGNOSIS — D860 Sarcoidosis of lung: Secondary | ICD-10-CM | POA: Diagnosis not present

## 2018-02-19 DIAGNOSIS — G894 Chronic pain syndrome: Secondary | ICD-10-CM | POA: Diagnosis not present

## 2018-02-22 ENCOUNTER — Telehealth: Payer: Self-pay | Admitting: Pulmonary Disease

## 2018-02-22 DIAGNOSIS — J9611 Chronic respiratory failure with hypoxia: Secondary | ICD-10-CM | POA: Diagnosis not present

## 2018-02-22 DIAGNOSIS — G894 Chronic pain syndrome: Secondary | ICD-10-CM | POA: Diagnosis not present

## 2018-02-22 DIAGNOSIS — I272 Pulmonary hypertension, unspecified: Secondary | ICD-10-CM | POA: Diagnosis not present

## 2018-02-22 DIAGNOSIS — J441 Chronic obstructive pulmonary disease with (acute) exacerbation: Secondary | ICD-10-CM | POA: Diagnosis not present

## 2018-02-22 DIAGNOSIS — D860 Sarcoidosis of lung: Secondary | ICD-10-CM | POA: Diagnosis not present

## 2018-02-22 DIAGNOSIS — I10 Essential (primary) hypertension: Secondary | ICD-10-CM | POA: Diagnosis not present

## 2018-02-22 NOTE — Telephone Encounter (Signed)
Called and spoke with [place], [facility].  She stated that she would contact the Patient today or in the morning, to find out what kind of tanks she has and to get larger tanks out to them.  Called and spoke with [name], Patient's Daughter.  Explained that [facility], is going to call today or in the morning.  [name] stated understanding.  Nothing further at this time.

## 2018-02-23 ENCOUNTER — Encounter (INDEPENDENT_AMBULATORY_CARE_PROVIDER_SITE_OTHER): Payer: Medicare Other | Admitting: Ophthalmology

## 2018-02-23 DIAGNOSIS — H34832 Tributary (branch) retinal vein occlusion, left eye, with macular edema: Secondary | ICD-10-CM

## 2018-02-23 DIAGNOSIS — H35033 Hypertensive retinopathy, bilateral: Secondary | ICD-10-CM | POA: Diagnosis not present

## 2018-02-23 DIAGNOSIS — I10 Essential (primary) hypertension: Secondary | ICD-10-CM

## 2018-02-23 DIAGNOSIS — H43813 Vitreous degeneration, bilateral: Secondary | ICD-10-CM | POA: Diagnosis not present

## 2018-02-24 DIAGNOSIS — I272 Pulmonary hypertension, unspecified: Secondary | ICD-10-CM | POA: Diagnosis not present

## 2018-02-24 DIAGNOSIS — J441 Chronic obstructive pulmonary disease with (acute) exacerbation: Secondary | ICD-10-CM | POA: Diagnosis not present

## 2018-02-24 DIAGNOSIS — J9611 Chronic respiratory failure with hypoxia: Secondary | ICD-10-CM | POA: Diagnosis not present

## 2018-02-24 DIAGNOSIS — I10 Essential (primary) hypertension: Secondary | ICD-10-CM | POA: Diagnosis not present

## 2018-02-24 DIAGNOSIS — D860 Sarcoidosis of lung: Secondary | ICD-10-CM | POA: Diagnosis not present

## 2018-02-24 DIAGNOSIS — G894 Chronic pain syndrome: Secondary | ICD-10-CM | POA: Diagnosis not present

## 2018-02-26 ENCOUNTER — Other Ambulatory Visit: Payer: Self-pay | Admitting: Family Medicine

## 2018-02-26 ENCOUNTER — Ambulatory Visit: Payer: Medicare Other | Admitting: Family Medicine

## 2018-03-01 ENCOUNTER — Telehealth: Payer: Self-pay

## 2018-03-01 NOTE — Telephone Encounter (Signed)
Copied from [place] [id number]. Topic: General - Other >> Mar 01, 2018 11:17 AM Yvette Rack wrote: Reason for CRM: Pt states she has an appt on Thursday 03/04/18 and would like to speak with the [facility] nurse. Cb# [phone]    Please advise

## 2018-03-02 ENCOUNTER — Ambulatory Visit (HOSPITAL_COMMUNITY)
Admission: RE | Admit: 2018-03-02 | Discharge: 2018-03-02 | Disposition: A | Discharge status: Home / Self Care | Payer: Medicare Other | Source: Ambulatory Visit | Attending: Cardiology | Admitting: Cardiology

## 2018-03-02 ENCOUNTER — Encounter (HOSPITAL_COMMUNITY): Payer: Self-pay | Admitting: Cardiology

## 2018-03-02 VITALS — BP 120/74 | HR 69 | Wt 171.2 lb

## 2018-03-02 DIAGNOSIS — Z7951 Long term (current) use of inhaled steroids: Secondary | ICD-10-CM | POA: Diagnosis not present

## 2018-03-02 DIAGNOSIS — Z79890 Hormone replacement therapy (postmenopausal): Secondary | ICD-10-CM | POA: Insufficient documentation

## 2018-03-02 DIAGNOSIS — I272 Pulmonary hypertension, unspecified: Secondary | ICD-10-CM

## 2018-03-02 DIAGNOSIS — Z9981 Dependence on supplemental oxygen: Secondary | ICD-10-CM | POA: Diagnosis not present

## 2018-03-02 DIAGNOSIS — J439 Emphysema, unspecified: Secondary | ICD-10-CM | POA: Diagnosis not present

## 2018-03-02 DIAGNOSIS — Z87891 Personal history of nicotine dependence: Secondary | ICD-10-CM | POA: Insufficient documentation

## 2018-03-02 DIAGNOSIS — E785 Hyperlipidemia, unspecified: Secondary | ICD-10-CM | POA: Insufficient documentation

## 2018-03-02 DIAGNOSIS — D649 Anemia, unspecified: Secondary | ICD-10-CM | POA: Diagnosis not present

## 2018-03-02 DIAGNOSIS — E039 Hypothyroidism, unspecified: Secondary | ICD-10-CM | POA: Diagnosis not present

## 2018-03-02 DIAGNOSIS — Z7982 Long term (current) use of aspirin: Secondary | ICD-10-CM | POA: Insufficient documentation

## 2018-03-02 DIAGNOSIS — I10 Essential (primary) hypertension: Secondary | ICD-10-CM | POA: Diagnosis not present

## 2018-03-02 DIAGNOSIS — Z79899 Other long term (current) drug therapy: Secondary | ICD-10-CM | POA: Diagnosis not present

## 2018-03-02 DIAGNOSIS — I2510 Atherosclerotic heart disease of native coronary artery without angina pectoris: Secondary | ICD-10-CM | POA: Insufficient documentation

## 2018-03-02 DIAGNOSIS — D869 Sarcoidosis, unspecified: Secondary | ICD-10-CM | POA: Insufficient documentation

## 2018-03-02 DIAGNOSIS — J9611 Chronic respiratory failure with hypoxia: Secondary | ICD-10-CM | POA: Diagnosis not present

## 2018-03-02 MED ORDER — HYDROCHLOROTHIAZIDE 12.5 MG PO CAPS: 12.5000 mg | ORAL_CAPSULE | Freq: Every day | ORAL | 3 refills | Status: DC

## 2018-03-02 MED ORDER — SELEXIPAG 200 MCG PO TABS: 1.0000 | ORAL_TABLET | Freq: Every day | ORAL | 11 refills | Status: DC

## 2018-03-02 MED ORDER — TADALAFIL (PAH) 20 MG PO TABS: 40.0000 mg | ORAL_TABLET | Freq: Every day | ORAL | 11 refills | Status: DC

## 2018-03-02 NOTE — Progress Notes (Signed)
PCP: Dr. Charlett Blake Pulmonary: Dr. Lake Bells Cardiology: Dr. Aundra Dubin  72 y.o. with history of sarcoidosis and emphysema was referred by Dr Lake Bells for evaluation of pulmonary hypertension.  She has a diagnosis of sarcoidosis, but CT chest in 3/19 did not show marked parenchymal disease.  PFTs showed very low DLCO.  She had RHC done in 4/19 with moderate pulmonary hypertension and very high PVR.    She is now wearing 2 L [place] at all times.  She does not smoke, says she quit > 20 years ago.    Echo in 6/19 showed EF 55-60%, mildly dilated RV with moderately decreased systolic function, PASP 83 mmHg, aortic sclerosis without significant stenosis.   She returns for followup of pulmonary hypertension and dyspnea.  She is now on tadalafil 20 mg daily and macitentan 10 mg daily.  Ever since starting macitentan, she has had flu-like symptoms with chills, myalgias, headache.  This has been going on for a month.  She does not feel that either med has helped her breathing significantly.  She is still short of breath walking around the house. No chest pain, lightheadedness/syncope, orthopnea/PND.  She is off HCTZ, BP is high at times when she checks at home. Weight stable.   6 minute walk (5/19): 122 m  Labs (3/19): LDL 53 Labs (4/19): hgb 8.5, K 4.8, creatinine 1.06 Labs (5/19): anti-SCL70 negative, ANA negative, RF negative, ANCA negative, HIV negative, anti-centromere negative.  Labs (6/19): K 4, creatinine 1.03  PMH: 1. Sarcoidosis: On home oxygen.  - PFTs (3/19): FEV1 84%, FVC 84%, ratio 71%, TLC 75%, DLCO 25% - CT (3/19) with mild fibrotic changes in the lung bases.  2. Emphysema: Moderate emphysema on CT chest 3/19.  3. CAD: Calcification of coronaries on CT chest in 3/19.  - Cardiolite in 1/19: EF 65%, no ischemia/infarction.  4. HTN 5. Hyperlipidemia 6. Hypothyroidism 7. Anemia 8. Pulmonary hypertension:  - Echo (9/17): EF 60-65%, PASP 44 mmHg.  - RHC (4/19): mean RA 2, PA 66/16 mean 34, mean PCWP  3, PVR 8.3 WU, CI 2.04 - Autoimmune serologies negative.  - V/Q scan did not show evidence for chronic PE.  - Echo (6/19): EF 55-60%, mildly dilated RV with moderately decreased systolic function, PASP 83 mmHg, aortic sclerosis without significant stenosis.   Social History   Socioeconomic History  . Marital status: Widowed    Spouse name: Not on file  . Number of children: 2  . Years of education: Not on file  . Highest education level: Not on file  Occupational History  . Not on file  Social Needs  . Financial resource strain: Not on file  . Food insecurity:    Worry: Not on file    Inability: Not on file  . Transportation needs:    Medical: Not on file    Non-medical: Not on file  Tobacco Use  . Smoking status: Former Smoker    Packs/day: 0.50    Years: 32.00    Pack years: 16.00    Types: Cigarettes    Last attempt to quit: 07/22/1994    Years since quitting: 23.6  . Smokeless tobacco: Never Used  Substance and Sexual Activity  . Alcohol use: No    Alcohol/week: 0.0 oz  . Drug use: No  . Sexual activity: Not Currently  Lifestyle  . Physical activity:    Days per week: Not on file    Minutes per session: Not on file  . Stress: Not on file  Relationships  .  Social connections:    Talks on phone: Not on file    Gets together: Not on file    Attends religious service: Not on file    Active member of club or organization: Not on file    Attends meetings of clubs or organizations: Not on file    Relationship status: Not on file  . Intimate partner violence:    Fear of current or ex partner: Not on file    Emotionally abused: Not on file    Physically abused: Not on file    Forced sexual activity: Not on file  Other Topics Concern  . Not on file  Social History Narrative   Married 60 years and has 2 children (2 daughters)   Alcohol Use - no   Former Smoker - she quit 10 years ago, started when she was 70 and has had varying level of use of tobacco products  from 1/2 pack to 3 packs per day.   Family History  Problem Relation Age of Onset  . Cancer Mother 65       Breast  . Heart disease Mother   . Hypertension Mother   . Hyperlipidemia Mother   . Heart attack Mother   . Asthma Maternal Grandmother    ROS: All systems reviewed and negative except as per HPI.   Current Outpatient Medications  Medication Sig Dispense Refill  . acetaminophen (TYLENOL) 500 MG tablet Take 1,000 mg by mouth every 6 (six) hours as needed for moderate pain.    Marland Kitchen albuterol (PROVENTIL HFA;VENTOLIN HFA) 108 (90 Base) MCG/ACT inhaler Inhale 2 puffs into the lungs every 6 (six) hours as needed for wheezing or shortness of breath. 1 Inhaler 6  . albuterol (PROVENTIL) (2.5 MG/3ML) 0.083% nebulizer solution Take 3 mLs (2.5 mg total) by nebulization every 6 (six) hours as needed for wheezing or shortness of breath. 30 mL 4  . aspirin EC 81 MG tablet Take 81 mg by mouth at bedtime.     Marland Kitchen atenolol (TENORMIN) 25 MG tablet Take 25 mg by mouth at bedtime.    Marland Kitchen atenolol (TENORMIN) 50 MG tablet TAKE 1 TABLET (50 MG TOTAL) BY MOUTH DAILY. (Patient taking differently: Take 50 mg by mouth every morning. ) 90 tablet 1  . BESIVANCE 0.6 % SUSP Place 1 drop into the left eye See admin instructions. INSTILL 1 DROP INTO LEFT 4 TIMES DAILY x 2 DAYS AFTER EYE INJECTION  12  . budesonide-formoterol (SYMBICORT) 160-4.5 MCG/ACT inhaler Inhale 2 puffs into the lungs 2 (two) times daily. 1 Inhaler 0  . calcium carbonate (OSCAL) 1500 (600 Ca) MG TABS tablet Take 1,200 mg by mouth daily with breakfast.    . Cholecalciferol (VITAMIN D) 2000 UNITS tablet Take 2,000 Units by mouth every evening.     . Cinnamon 500 MG capsule Take 2,000 mg by mouth daily.      . citalopram (CELEXA) 10 MG tablet Take 1 tablet (10 mg total) by mouth at bedtime. 30 tablet 1  . Coenzyme Q10 200 MG capsule Take 200 mg by mouth at bedtime.     . fexofenadine (ALLEGRA) 180 MG tablet Take 180 mg by mouth daily.    Marland Kitchen gabapentin  (NEURONTIN) 300 MG capsule TAKE 1 CAPSULE (300 MG TOTAL) BY MOUTH AT BEDTIME. 90 capsule 2  . Glucosamine HCl (GLUCOSAMINE PO) Take 1 tablet by mouth every evening.     Marland Kitchen guaiFENesin (MUCINEX) 600 MG 12 hr tablet Take 2 tablets (1,200 mg total) by  mouth 2 (two) times daily. 60 tablet 0  . ketoconazole (NIZORAL) 2 % shampoo Apply 1 application topically 2 (two) times a week. (Patient taking differently: Apply 1 application topically every 14 (fourteen) days. ONCE EVERY 2 WEEKS PER PATIENT) 120 mL 03  . levothyroxine (SYNTHROID, LEVOTHROID) 112 MCG tablet TAKE 1 TABLET (112 MCG TOTAL) BY MOUTH DAILY BEFORE BREAKFAST. 90 tablet 1  . nystatin (MYCOSTATIN) 100000 UNIT/ML suspension Take 5 mLs (500,000 Units total) by mouth 4 (four) times daily. 60 mL 0  . Omega-3 Fatty Acids (FISH OIL) 1200 MG CAPS Take 1,200 mg by mouth daily.     Marland Kitchen omeprazole (PRILOSEC) 40 MG capsule TAKE 1 CAPSULE (40 MG TOTAL) BY MOUTH DAILY. 90 capsule 1  . rosuvastatin (CRESTOR) 20 MG tablet TAKE 1 TABLET (20 MG TOTAL) BY MOUTH DAILY. (Patient taking differently: Take 20 mg by mouth at bedtime. ) 90 tablet 2  . Spacer/Aero-Holding Chambers (AEROCHAMBER PLUS WITH MASK) inhaler Use as instructed 1 each 2  . tadalafil, PAH, (ADCIRCA) 20 MG tablet Take 2 tablets (40 mg total) by mouth daily. 60 tablet 11  . traMADol (ULTRAM) 50 MG tablet Take 2 tablets (100 mg total) by mouth every 6 (six) hours as needed for moderate pain or severe pain. 120 tablet 0  . vitamin B-12 (CYANOCOBALAMIN) 1000 MCG tablet Take 1,000 mcg by mouth daily.    . hydrochlorothiazide (MICROZIDE) 12.5 MG capsule Take 1 capsule (12.5 mg total) by mouth daily. 90 capsule 3  . Selexipag (UPTRAVI) 200 MCG TABS Take 1 tablet (200 mcg total) by mouth daily. 60 tablet 11   No current facility-administered medications for this encounter.    BP 120/74   Pulse 69   Wt 171 lb 3.2 oz (77.7 kg)   SpO2 91% Comment: 4L  BMI 30.33 kg/m  General: NAD Neck: No JVD, no  thyromegaly or thyroid nodule.  Lungs: Distant breath sounds bilaterally.  CV: Nondisplaced PMI.  Heart regular S1/S2, no S3/S4, no murmur.  No peripheral edema.  No carotid bruit.  Normal pedal pulses.  Abdomen: Soft, nontender, no hepatosplenomegaly, no distention.  Skin: Intact without lesions or rashes.  Neurologic: Alert and oriented x 3.  Psych: Normal affect. Extremities: No clubbing or cyanosis.  HEENT: Normal.   Assessment/Plan: 1. Pulmonary hypertension: Cath 4/19 with moderate PAH, very high PVR. Right and left heart filling pressures were low.  PFTs in 3/19 showed mild restriction with very low DLCO consistent with pulmonary vascular disease.  V/Q scan with no evidence for chronic PE and autoimmune serologies were negative. High resolution CT in 3/19 showed mild fibrotic changes at the lung bases and a degree of emphysema.  I do not think CT findings can explain her PH.  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  Echo 6/19 showed normal LV EF but mildly dilated/moderately dysfunctional RV with severe pulmonary hypertension. She has had flu-like symptoms for the 30 days she has been on Opsumit.  - She still needs a sleep study with the pulmonary department still.  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  As parenchymal disease does not appear particularly bad on CT, I think a trial of pulmonary vasodilators is reasonable.  She is on Adcirca 20 mg daily and Opsumit 10 mg, but is likely having flu-like symptoms due to Opsumit.  I will stop Opsumit and increase Adcirca to 40 mg daily.  - I think she merits ongoing attempt at combination therapy.  Will work on getting her  [name].    2. Chronic hypoxemic respiratory failure: She is on home oxygen.  As her parenchymal disease does not appear marked, I suspect pulmonary hypertension is playing a role in her low oxygen saturation.   - Continue home oxygen, watch for desaturation with initiation of pulmonary vasodilators.  3. HTN: BP  running higher off HCTZ.  - Restart HCTZ 12.5 mg daily with BMET 2 wks.   4. Hyperlipidemia: Good lipids 3/19.   Followup in 1 month.   [name] 03/02/2018

## 2018-03-02 NOTE — Patient Instructions (Signed)
STOP Opsumit.  INCREASE Adcirca to 40 mg (2 tabs) once daily.  START HCTZ (Hydrochlorathiazide) 12.5 mg capsule once daily.  [name] per specialty pharmacy titration protocol. You will receive a phone call from [facility] with further directions about medication shipment and how to start taking.  Follow up 2 weeks for lab work.  ________________________________________________________________ Kimberly Irwin Code: 1500  Follow up 4-6 weeks with Dr. Aundra Dubin.  ________________________________________________________________ Kimberly Irwin Code: 1600  Take all medication as prescribed the day of your appointment. Bring all medications with you to your appointment.  Do the following things EVERYDAY: 1) Weigh yourself in the morning before breakfast. Write it down and keep it in a log. 2) Take your medicines as prescribed 3) Eat low salt foods-Limit salt (sodium) to 2000 mg per day.  4) Stay as active as you can everyday 5) Limit all fluids for the day to less than 2 liters

## 2018-03-03 DIAGNOSIS — J9611 Chronic respiratory failure with hypoxia: Secondary | ICD-10-CM | POA: Diagnosis not present

## 2018-03-03 DIAGNOSIS — I10 Essential (primary) hypertension: Secondary | ICD-10-CM | POA: Diagnosis not present

## 2018-03-03 DIAGNOSIS — D860 Sarcoidosis of lung: Secondary | ICD-10-CM | POA: Diagnosis not present

## 2018-03-03 DIAGNOSIS — J441 Chronic obstructive pulmonary disease with (acute) exacerbation: Secondary | ICD-10-CM | POA: Diagnosis not present

## 2018-03-03 DIAGNOSIS — G894 Chronic pain syndrome: Secondary | ICD-10-CM | POA: Diagnosis not present

## 2018-03-03 DIAGNOSIS — I272 Pulmonary hypertension, unspecified: Secondary | ICD-10-CM | POA: Diagnosis not present

## 2018-03-04 ENCOUNTER — Ambulatory Visit (INDEPENDENT_AMBULATORY_CARE_PROVIDER_SITE_OTHER): Payer: Medicare Other | Admitting: Family Medicine

## 2018-03-04 ENCOUNTER — Other Ambulatory Visit: Payer: Self-pay

## 2018-03-04 DIAGNOSIS — I10 Essential (primary) hypertension: Secondary | ICD-10-CM | POA: Diagnosis not present

## 2018-03-04 DIAGNOSIS — J439 Emphysema, unspecified: Secondary | ICD-10-CM

## 2018-03-04 DIAGNOSIS — E782 Mixed hyperlipidemia: Secondary | ICD-10-CM | POA: Diagnosis not present

## 2018-03-04 DIAGNOSIS — R739 Hyperglycemia, unspecified: Secondary | ICD-10-CM

## 2018-03-04 NOTE — Patient Instructions (Signed)
Hypoxia °Hypoxia is a condition that happens when there is a lack of oxygen in the body's tissues and organs. When there is not enough oxygen, organs cannot work as they should. This causes serious problems throughout the body and in the brain. °What are the causes? °This condition may be caused by: °· Exposure to high altitude. °· A collapsed lung (pneumothorax). °· Lung infection (pneumonia). °· Lung injury. °· Long-term (chronic) lung disease, such as COPD (chronic obstructive pulmonary disease). °· Blood collecting in the chest cavity (hemothorax). °· Food, saliva, or vomit getting into the airway (aspiration). °· Reduced blood flow (ischemia). °· Severe blood loss. °· Slow or shallow breathing (hypoventilation). °· Blood disorders, such as anemia. °· Carbon monoxide poisoning. °· The heart suddenly stopping (cardiac arrest). °· Anesthetic medicines. °· Drowning. °· Choking. ° °What are the signs or symptoms? °Symptoms of this condition include: °· Headache. °· Fatigue. °· Drowsiness. °· Forgetfulness. °· Nausea. °· Confusion. °· Shortness of breath. °· Dizziness. °· Bluish color of the skin, lips, or nail beds (cyanosis). °· Change in consciousness or awareness. ° °If hypoxia is not treated, it can lead to convulsions, loss of consciousness (coma), or brain damage. °How is this diagnosed? °This condition may be diagnosed based on: °· A physical exam. °· Blood tests. °· A test that measures how much oxygen is in your blood (pulse oximetry). This is done with a sensor that is placed on your finger, toe, or earlobe. °· Chest X-ray. °· Tests to check your lung function (pulmonary function tests). °· A test to check the electrical activity of your heart (electrocardiogram, ECG). ° °You may have other tests to determine the cause of your hypoxia. °How is this treated? °Treatment for this condition depends on what is causing the hypoxia. You will likely be treated with oxygen therapy. This may be done by giving you  oxygen through a face mask or through tubes in your nose. °Your health care provider may also recommend other therapies to treat the underlying cause of your hypoxia. °Follow these instructions at home: °· Take over-the-counter and prescription medicines only as told by your health care provider. °· Do not use any products that contain nicotine or tobacco, such as cigarettes and e-cigarettes. If you need help quitting, ask your health care provider. °· Avoid secondhand smoke. °· Work with your health care provider to manage any chronic conditions you have that may be causing hypoxia, such as COPD. °· Keep all follow-up visits as told by your health care provider. This is important. °Contact a health care provider if: °· You have a fever. °· You have trouble breathing, even after treatment. °· You become extremely short of breath when you exercise. °Get help right away if: °· Your shortness of breath gets worse, especially with normal or very little activity. °· Your skin, lips, or nail beds have a bluish color. °· You become confused or you cannot think properly. °· You have chest pain. °Summary °· Hypoxia is a condition that happens when there is a lack of oxygen in the body's tissues and organs. °· If hypoxia is not treated, it can lead to convulsions, loss of consciousness (coma), or brain damage. °· Symptoms of hypoxia can include a headache, shortness of breath, confusion, nausea, and a bluish skin color. °· Hypoxia has many possible causes, including exposure to high altitude, carbon monoxide poisoning, or other health issues, such as blood disorders or cardiac arrest. °· Hypoxia is usually treated with oxygen therapy. °This information   is not intended to replace advice given to you by your health care provider. Make sure you discuss any questions you have with your health care provider. Document Released: 09/08/2016 Document Revised: 09/08/2016 Document Reviewed: 09/08/2016 [employer]vier Interactive Patient  Education  2018 [employer].

## 2018-03-04 NOTE — Progress Notes (Signed)
Subjective:  I acted as a Education administrator for Dr. Charlett Blake. [name], [place]  Patient ID: Kimberly Irwin, female    DOB: 1946-07-13, 72 y.o.   MRN: [id number]  No chief complaint on file.   HPI  Patient is in today for 2 month follow up and she continues to struggle with diminished activity levels secondary to respiratory symptoms and fatigability. She feels weak and winded with minimal ambulation. She limits her activity to staying around the house as a result. Notes ongoing joint pain as well. No recent febrile illness or hospitalizations. Does note some chills at times. Denies CP/palp/HA/congestion/fevers/GI or GU c/o. Taking meds as prescribed  Patient Care Team: Mosie Lukes, MD as PCP - General (Family Medicine) Earnie Larsson, MD as Consulting Physician (Neurosurgery) Paralee Cancel, MD as Consulting Physician (Orthopedic Surgery) Parrett, Fonnie Mu, NP as Nurse Practitioner (Pulmonary Disease)   Past Medical History:  Diagnosis Date  . Bursitis of left hip 10/26/2012  . Chronic low back pain   . Chronic respiratory failure with hypoxia ([place]) 10/21/2016  . Coronary artery calcification 07/03/2016  . DDD (degenerative disc disease)    L3-4 with facet arthropathy and stenosis  . Degenerative spondylolisthesis    L4-5 grade 1 with stenosis  . Emphysema lung ([place])   . GERD (gastroesophageal reflux disease)   . Hiatal hernia   . Hyperlipidemia   . Hypertension   . Hypothyroidism 10/28/2015  . Pain in joint, lower leg 10/26/2012   left   . Pneumonia    as a child several times.   . Pulmonary hypertension ([place])   . Sarcoidosis   . Tremors of nervous system     Past Surgical History:  Procedure Laterality Date  . ANTERIOR LAT LUMBAR FUSION Left 08/29/2014   Procedure: EXTREME LEFT LATERAL INTERBODY FUSION LUMBAR TWO-THREE LATERAL PLATE;  Surgeon: Charlie Pitter, MD;  Location: [facility];  Service: Neurosurgery;  Laterality: Left;  . BACK SURGERY  01/15/09   L3-4 and L4-5 decompressive  laminectomy with bilateral L3, L4, and L5 decompressive foraminotomies, more than it would be required for simple interbody fusion alone.  Marland Kitchen BACK SURGERY  01/15/09   L3-4 and L4-5 posterior lumbar interbody fusion utilizing tanget interbody allograft wedge, Telamon interbody PEEk cage, and local autografting.  Marland Kitchen BACK SURGERY  01/15/09   L3, L4, and L5 posterolateral arthrodesis using segmental pedicle screw fixation and local autografting.  Marland Kitchen CARDIAC CATHETERIZATION    . CARPAL TUNNEL RELEASE Bilateral   . COLONOSCOPY    . ESOPHAGOGASTRODUODENOSCOPY    . EYE SURGERY     lens implant  . JOINT REPLACEMENT  09/13/09   Right Hip, Dr. Alvan Dame  . KYPHOPLASTY N/A 09/14/2014   Procedure: Lumbar Two Kyphoplasty/Vertebroplasty;  Surgeon: Charlie Pitter, MD;  Location: [facility];  Service: Neurosurgery;  Laterality: N/A;  Lumbar Two Kyphoplasty/Vertebroplasty  . RIGHT HEART CATH N/A 11/19/2017   Procedure: RIGHT HEART CATH;  Surgeon: Larey Dresser, MD;  Location: [facility];  Service: Cardiovascular;  Laterality: N/A;  . THYROIDECTOMY    . TOTAL HIP ARTHROPLASTY Left 06/28/2013   Procedure: LEFT TOTAL  HIP ARTHROPLASTY ANTERIOR APPROACH;  Surgeon: Mauri Pole, MD;  Location: [facility];  Service: Orthopedics;  Laterality: Left;    Family History  Problem Relation Age of Onset  . Cancer Mother 33       Breast  . Heart disease Mother   . Hypertension Mother   . Hyperlipidemia Mother   .  Heart attack Mother   . Asthma Maternal Grandmother     Social History   Socioeconomic History  . Marital status: Widowed    Spouse name: Not on file  . Number of children: 2  . Years of education: Not on file  . Highest education level: Not on file  Occupational History  . Not on file  Social Needs  . Financial resource strain: Not on file  . Food insecurity:    Worry: Not on file    Inability: Not on file  . Transportation needs:    Medical: Not on file    Non-medical: Not on file  Tobacco  Use  . Smoking status: Former Smoker    Packs/day: 0.50    Years: 32.00    Pack years: 16.00    Types: Cigarettes    Last attempt to quit: 07/22/1994    Years since quitting: 23.6  . Smokeless tobacco: Never Used  Substance and Sexual Activity  . Alcohol use: No    Alcohol/week: 0.0 oz  . Drug use: No  . Sexual activity: Not Currently  Lifestyle  . Physical activity:    Days per week: Not on file    Minutes per session: Not on file  . Stress: Not on file  Relationships  . Social connections:    Talks on phone: Not on file    Gets together: Not on file    Attends religious service: Not on file    Active member of club or organization: Not on file    Attends meetings of clubs or organizations: Not on file    Relationship status: Not on file  . Intimate partner violence:    Fear of current or ex partner: Not on file    Emotionally abused: Not on file    Physically abused: Not on file    Forced sexual activity: Not on file  Other Topics Concern  . Not on file  Social History Narrative   Married 9 years and has 2 children (2 daughters)   Alcohol Use - no   Former Smoker - she quit 10 years ago, started when she was 62 and has had varying level of use of tobacco products from 1/2 pack to 3 packs per day.    Outpatient Medications Prior to Visit  Medication Sig Dispense Refill  . acetaminophen (TYLENOL) 500 MG tablet Take 1,000 mg by mouth every 6 (six) hours as needed for moderate pain.    Marland Kitchen albuterol (PROVENTIL HFA;VENTOLIN HFA) 108 (90 Base) MCG/ACT inhaler Inhale 2 puffs into the lungs every 6 (six) hours as needed for wheezing or shortness of breath. 1 Inhaler 6  . albuterol (PROVENTIL) (2.5 MG/3ML) 0.083% nebulizer solution Take 3 mLs (2.5 mg total) by nebulization every 6 (six) hours as needed for wheezing or shortness of breath. 30 mL 4  . aspirin EC 81 MG tablet Take 81 mg by mouth at bedtime.     Marland Kitchen atenolol (TENORMIN) 25 MG tablet Take 25 mg by mouth at bedtime.      Marland Kitchen atenolol (TENORMIN) 50 MG tablet TAKE 1 TABLET (50 MG TOTAL) BY MOUTH DAILY. (Patient taking differently: Take 50 mg by mouth every morning. ) 90 tablet 1  . BESIVANCE 0.6 % SUSP Place 1 drop into the left eye See admin instructions. INSTILL 1 DROP INTO LEFT 4 TIMES DAILY x 2 DAYS AFTER EYE INJECTION  12  . budesonide-formoterol (SYMBICORT) 160-4.5 MCG/ACT inhaler Inhale 2 puffs into the lungs 2 (two) times  daily. 1 Inhaler 0  . calcium carbonate (OSCAL) 1500 (600 Ca) MG TABS tablet Take 1,200 mg by mouth daily with breakfast.    . Cholecalciferol (VITAMIN D) 2000 UNITS tablet Take 2,000 Units by mouth every evening.     . Cinnamon 500 MG capsule Take 2,000 mg by mouth daily.      . citalopram (CELEXA) 10 MG tablet Take 1 tablet (10 mg total) by mouth at bedtime. 30 tablet 1  . Coenzyme Q10 200 MG capsule Take 200 mg by mouth at bedtime.     . fexofenadine (ALLEGRA) 180 MG tablet Take 180 mg by mouth daily.    Marland Kitchen gabapentin (NEURONTIN) 300 MG capsule TAKE 1 CAPSULE (300 MG TOTAL) BY MOUTH AT BEDTIME. 90 capsule 2  . Glucosamine HCl (GLUCOSAMINE PO) Take 1 tablet by mouth every evening.     Marland Kitchen guaiFENesin (MUCINEX) 600 MG 12 hr tablet Take 2 tablets (1,200 mg total) by mouth 2 (two) times daily. 60 tablet 0  . hydrochlorothiazide (MICROZIDE) 12.5 MG capsule Take 1 capsule (12.5 mg total) by mouth daily. 90 capsule 3  . ketoconazole (NIZORAL) 2 % shampoo Apply 1 application topically 2 (two) times a week. (Patient taking differently: Apply 1 application topically every 14 (fourteen) days. ONCE EVERY 2 WEEKS PER PATIENT) 120 mL 03  . levothyroxine (SYNTHROID, LEVOTHROID) 112 MCG tablet TAKE 1 TABLET (112 MCG TOTAL) BY MOUTH DAILY BEFORE BREAKFAST. 90 tablet 1  . nystatin (MYCOSTATIN) 100000 UNIT/ML suspension Take 5 mLs (500,000 Units total) by mouth 4 (four) times daily. 60 mL 0  . Omega-3 Fatty Acids (FISH OIL) 1200 MG CAPS Take 1,200 mg by mouth daily.     Marland Kitchen omeprazole (PRILOSEC) 40 MG capsule  TAKE 1 CAPSULE (40 MG TOTAL) BY MOUTH DAILY. 90 capsule 1  . rosuvastatin (CRESTOR) 20 MG tablet TAKE 1 TABLET (20 MG TOTAL) BY MOUTH DAILY. (Patient taking differently: Take 20 mg by mouth at bedtime. ) 90 tablet 2  . Selexipag (UPTRAVI) 200 MCG TABS Take 1 tablet (200 mcg total) by mouth daily. 60 tablet 11  . Spacer/Aero-Holding Chambers (AEROCHAMBER PLUS WITH MASK) inhaler Use as instructed 1 each 2  . tadalafil, PAH, (ADCIRCA) 20 MG tablet Take 2 tablets (40 mg total) by mouth daily. 60 tablet 11  . traMADol (ULTRAM) 50 MG tablet Take 2 tablets (100 mg total) by mouth every 6 (six) hours as needed for moderate pain or severe pain. 120 tablet 0  . vitamin B-12 (CYANOCOBALAMIN) 1000 MCG tablet Take 1,000 mcg by mouth daily.     No facility-administered medications prior to visit.     Allergies  Allergen Reactions  . Lipitor [Atorvastatin] Swelling  . Penicillins Swelling    Has patient had a PCN reaction causing immediate rash, facial/tongue/throat swelling, SOB or lightheadedness with hypotension: Yes Has patient had a PCN reaction causing severe rash involving mucus membranes or skin necrosis: No Has patient had a PCN reaction that required hospitalization: No Has patient had a PCN reaction occurring within the last 10 years: No If all of the above answers are "NO", then may proceed with Cephalosporin use.   . Metronidazole Swelling  . Pregabalin Other (See Comments)    REACTION: Somnolence and dizziness  . Simvastatin Other (See Comments)    Leg pain    Review of Systems  Constitutional: Positive for chills and malaise/fatigue. Negative for fever.  HENT: Negative for congestion.   Eyes: Negative for blurred vision.  Respiratory: Positive for cough, sputum  production and shortness of breath.   Cardiovascular: Negative for chest pain, palpitations and leg swelling.  Gastrointestinal: Negative for abdominal pain, blood in stool and nausea.  Genitourinary: Negative for dysuria  and frequency.  Musculoskeletal: Negative for falls.  Skin: Negative for rash.  Neurological: Negative for dizziness, loss of consciousness and headaches.  Endo/Heme/Allergies: Negative for environmental allergies.  Psychiatric/Behavioral: Negative for depression. The patient is not nervous/anxious.        Objective:    Physical Exam  Constitutional: She is oriented to person, place, and time. She appears well-developed and well-nourished. No distress.  HENT:  Head: Normocephalic and atraumatic.  Nose: Nose normal.  Eyes: Right eye exhibits no discharge. Left eye exhibits no discharge.  Neck: Normal range of motion. Neck supple.  Cardiovascular: Normal rate and regular rhythm.  No murmur heard. Pulmonary/Chest: No respiratory distress. She has no wheezes.  Prolonged expiration b/l   Abdominal: Soft. Bowel sounds are normal. There is no tenderness.  Musculoskeletal: She exhibits no edema.  Neurological: She is alert and oriented to person, place, and time.  Skin: Skin is warm and dry.  Psychiatric: She has a normal mood and affect.  Nursing note and vitals reviewed.   BP 132/90 (BP Location: Left Arm, Patient Position: Sitting, Cuff Size: Normal)   Pulse 63   Temp (!) 97.5 F (36.4 C) (Oral)   Resp 18   Wt 171 lb 6.4 oz (77.7 kg)   SpO2 93%   BMI 30.36 kg/m  Wt Readings from Last 3 Encounters:  03/04/18 171 lb 6.4 oz (77.7 kg)  03/02/18 171 lb 3.2 oz (77.7 kg)  02/12/18 174 lb 6 oz (79.1 kg)   BP Readings from Last 3 Encounters:  03/04/18 132/90  03/02/18 120/74  02/12/18 124/62     Immunization History  Administered Date(s) Administered  . Influenza Split 05/08/2011, 05/26/2012  . Influenza Whole 06/03/2006, 05/31/2007, 04/26/2008, 05/23/2009, 05/20/2010  . Influenza, High Dose Seasonal PF 05/23/2013, 04/03/2016, 06/03/2017  . Influenza,inj,Quad PF,6+ Mos 04/27/2014, 06/15/2015  . Pneumococcal Conjugate-13 04/27/2014  . Pneumococcal Polysaccharide-23  06/18/2011, 07/03/2016  . Tdap 06/18/2011    Health Maintenance  Topic Date Due  . FOOT EXAM  04/09/1956  . OPHTHALMOLOGY EXAM  04/09/1956  . URINE MICROALBUMIN  04/28/2015  . HEMOGLOBIN A1C  12/17/2017  . INFLUENZA VACCINE  03/04/2018  . Fecal DNA (Cologuard)  07/11/2018  . MAMMOGRAM  05/26/2019  . TETANUS/TDAP  06/17/2021  . DEXA SCAN  Completed  . Hepatitis C Screening  Completed  . PNA vac Low Risk Adult  Completed    Lab Results  Component Value Date   WBC 14.0 (H) 01/27/2018   HGB 13.7 01/27/2018   HCT 42.0 01/27/2018   PLT 205 01/27/2018   GLUCOSE 119 (H) 01/27/2018   CHOL 135 10/19/2017   TRIG 200.0 (H) 10/19/2017   HDL 42.30 10/19/2017   LDLDIRECT 84.0 03/03/2017   LDLCALC 53 10/19/2017   ALT 13 01/27/2018   AST 31 01/27/2018   NA 141 01/27/2018   K 4.0 01/27/2018   CL 105 01/27/2018   CREATININE 1.03 (H) 01/27/2018   BUN 25 (H) 01/27/2018   CO2 27 01/27/2018   TSH 0.11 (L) 10/19/2017   INR 1.11 11/19/2017   HGBA1C 5.5 06/19/2017   MICROALBUR 0.2 04/27/2014    Lab Results  Component Value Date   TSH 0.11 (L) 10/19/2017   Lab Results  Component Value Date   WBC 14.0 (H) 01/27/2018   HGB 13.7 01/27/2018  HCT 42.0 01/27/2018   MCV 92.3 01/27/2018   PLT 205 01/27/2018   Lab Results  Component Value Date   NA 141 01/27/2018   K 4.0 01/27/2018   CO2 27 01/27/2018   GLUCOSE 119 (H) 01/27/2018   BUN 25 (H) 01/27/2018   CREATININE 1.03 (H) 01/27/2018   BILITOT 1.1 01/27/2018   ALKPHOS 37 (L) 01/27/2018   AST 31 01/27/2018   ALT 13 01/27/2018   PROT 6.4 (L) 01/27/2018   ALBUMIN 3.4 (L) 01/27/2018   CALCIUM 9.2 01/27/2018   ANIONGAP 9 01/27/2018   GFR 54.52 (L) 10/19/2017   Lab Results  Component Value Date   CHOL 135 10/19/2017   Lab Results  Component Value Date   HDL 42.30 10/19/2017   Lab Results  Component Value Date   LDLCALC 53 10/19/2017   Lab Results  Component Value Date   TRIG 200.0 (H) 10/19/2017   Lab Results   Component Value Date   CHOLHDL 3 10/19/2017   Lab Results  Component Value Date   HGBA1C 5.5 06/19/2017         Assessment & Plan:   Problem List Items Addressed This Visit    Hyperlipidemia (Chronic)    Encouraged heart healthy diet, increase exercise, avoid trans fats, consider a krill oil cap daily      Essential hypertension (Chronic)    Well controlled, no changes to meds. Encouraged heart healthy diet such as the DASH diet and exercise as tolerated.       Hyperglycemia    hgba1c acceptable, minimize simple carbs. Increase exercise as tolerated.       COPD (chronic obstructive pulmonary disease) ([place])    Follows closely with pulmonology and they continue to work on her medications and struggle with cost and debility. She is very limited in her daily activity and is essentially house bound except when she comes to the doctor's office. Discussed forms such as the DNR and MOST forms with patient. She will take home copies to discuss with her daughters. She agrees to a consultation with Palliative care to discuss her options.          I am having Kimberly Irwin maintain her Cinnamon, Fish Oil, Vitamin D, Coenzyme Q10, aspirin EC, traMADol, BESIVANCE, Glucosamine HCl (GLUCOSAMINE PO), vitamin B-12, albuterol, albuterol, rosuvastatin, levothyroxine, ketoconazole, atenolol, budesonide-formoterol, omeprazole, citalopram, gabapentin, atenolol, calcium carbonate, fexofenadine, acetaminophen, guaiFENesin, aerochamber plus with mask, nystatin, tadalafil (PAH), hydrochlorothiazide, and Selexipag.  No orders of the defined types were placed in this encounter.   CMA served as Education administrator during this visit. History, Physical and Plan performed by medical provider. Documentation and orders reviewed and attested to.  Penni Homans, MD

## 2018-03-07 NOTE — Assessment & Plan Note (Signed)
Well controlled, no changes to meds. Encouraged heart healthy diet such as the DASH diet and exercise as tolerated.

## 2018-03-07 NOTE — Assessment & Plan Note (Signed)
Follows closely with pulmonology and they continue to work on her medications and struggle with cost and debility. She is very limited in her daily activity and is essentially house bound except when she comes to the doctor's office. Discussed forms such as the DNR and MOST forms with patient. She will take home copies to discuss with her daughters. She agrees to a consultation with Palliative care to discuss her options.

## 2018-03-07 NOTE — Assessment & Plan Note (Signed)
hgba1c acceptable, minimize simple carbs. Increase exercise as tolerated.  

## 2018-03-07 NOTE — Assessment & Plan Note (Signed)
Encouraged heart healthy diet, increase exercise, avoid trans fats, consider a krill oil cap daily 

## 2018-03-08 ENCOUNTER — Telehealth (HOSPITAL_COMMUNITY): Payer: Self-pay | Admitting: *Deleted

## 2018-03-08 ENCOUNTER — Other Ambulatory Visit: Payer: Self-pay | Admitting: Family Medicine

## 2018-03-08 NOTE — Progress Notes (Addendum)
Subjective:   Kimberly Irwin is a 72 y.o. female who presents for Medicare Annual (Subsequent) preventive examination.  Review of Systems: No ROS.  Medicare Wellness Visit. Additional risk factors are reflected in the social history. Cardiac Risk Factors include: advanced age (>58mn, >>68women);dyslipidemia;hypertension Sleep patterns: wears 02/4l at night. Sleeps 8 hrs. Home Safety/Smoke Alarms: Feels safe in home. Smoke alarms in place.  Living environment; residence and Firearm Safety: Daughter lives with pt in her 1 story home.  Female:     Mammo-utd       Dexa scan-utd        CCS- Cologuard 07/12/15-negative     Objective:     Vitals: BP 120/68 (BP Location: Left Arm, Patient Position: Sitting, Cuff Size: Normal)   Pulse 68   Ht _0  (1.6 m)   Wt 169 lb 6.4 oz (76.8 kg)   SpO2 90%   BMI 30.01 kg/m   Body mass index is 30.01 kg/m. Wt Readings from Last 3 Encounters:  03/09/18 169 lb 6.4 oz (76.8 kg)  03/04/18 171 lb 6.4 oz (77.7 kg)  03/02/18 171 lb 3.2 oz (77.7 kg)   Temp Readings from Last 3 Encounters:  03/04/18 (!) 97.5 F (36.4 C) (Oral)  02/12/18 97.6 F (36.4 C) (Oral)  01/28/18 (!) 97.4 F (36.3 C) (Oral)   BP Readings from Last 3 Encounters:  03/09/18 120/68  03/04/18 132/90  03/02/18 120/74   Pulse Readings from Last 3 Encounters:  03/09/18 68  03/04/18 63  03/02/18 69    Advanced Directives 03/09/2018 01/25/2018 11/19/2017 09/24/2017 10/21/2016 04/20/2016 12/12/2015  Does Patient Have a Medical Advance Directive? Yes Yes No No Yes Yes Yes  Type of AParamedicof AEmbdenLiving will HSpearmanLiving will HWesleyLiving will Living will;Healthcare Power of Attorney  Does patient want to make changes to medical advance directive? - No - Patient declined - - - No - Patient declined No - Patient declined  Copy of HLucanin Chart? Yes - - -  No - copy requested No - copy requested No - copy requested  Would patient like information on creating a medical advance directive? - - No - Patient declined No - Patient declined - - -  Pre-existing out of facility DNR order (yellow form or pink MOST form) - - - - - - -    Tobacco Social History   Tobacco Use  Smoking Status Former Smoker  . Packs/day: 0.50  . Years: 32.00  . Pack years: 16.00  . Types: Cigarettes  . Last attempt to quit: 07/22/1994  . Years since quitting: 23.6  Smokeless Tobacco Never Used     Counseling given: Not Answered   Clinical Intake:     Pain : No/denies pain                 Past Medical History:  Diagnosis Date  . Bursitis of left hip 10/26/2012  . Chronic low back pain   . Chronic respiratory failure with hypoxia ([place] 10/21/2016  . Coronary artery calcification 07/03/2016  . DDD (degenerative disc disease)    L3-4 with facet arthropathy and stenosis  . Degenerative spondylolisthesis    L4-5 grade 1 with stenosis  . Emphysema lung ([place]   . GERD (gastroesophageal reflux disease)   . Hiatal hernia   . Hyperlipidemia   . Hypertension   . Hypothyroidism 10/28/2015  . Pain in  joint, lower leg 10/26/2012   left   . Pneumonia    as a child several times.   . Pulmonary hypertension ([place])   . Sarcoidosis   . Tremors of nervous system    Past Surgical History:  Procedure Laterality Date  . ANTERIOR LAT LUMBAR FUSION Left 08/29/2014   Procedure: EXTREME LEFT LATERAL INTERBODY FUSION LUMBAR TWO-THREE LATERAL PLATE;  Surgeon: Charlie Pitter, MD;  Location: [facility];  Service: Neurosurgery;  Laterality: Left;  . BACK SURGERY  01/15/09   L3-4 and L4-5 decompressive laminectomy with bilateral L3, L4, and L5 decompressive foraminotomies, more than it would be required for simple interbody fusion alone.  Marland Kitchen BACK SURGERY  01/15/09   L3-4 and L4-5 posterior lumbar interbody fusion utilizing tanget interbody allograft wedge, Telamon interbody  PEEk cage, and local autografting.  Marland Kitchen BACK SURGERY  01/15/09   L3, L4, and L5 posterolateral arthrodesis using segmental pedicle screw fixation and local autografting.  Marland Kitchen CARDIAC CATHETERIZATION    . CARPAL TUNNEL RELEASE Bilateral   . COLONOSCOPY    . ESOPHAGOGASTRODUODENOSCOPY    . EYE SURGERY     lens implant  . JOINT REPLACEMENT  09/13/09   Right Hip, Dr. Alvan Dame  . KYPHOPLASTY N/A 09/14/2014   Procedure: Lumbar Two Kyphoplasty/Vertebroplasty;  Surgeon: Charlie Pitter, MD;  Location: [facility];  Service: Neurosurgery;  Laterality: N/A;  Lumbar Two Kyphoplasty/Vertebroplasty  . RIGHT HEART CATH N/A 11/19/2017   Procedure: RIGHT HEART CATH;  Surgeon: Larey Dresser, MD;  Location: [facility];  Service: Cardiovascular;  Laterality: N/A;  . THYROIDECTOMY    . TOTAL HIP ARTHROPLASTY Left 06/28/2013   Procedure: LEFT TOTAL  HIP ARTHROPLASTY ANTERIOR APPROACH;  Surgeon: Mauri Pole, MD;  Location: [facility];  Service: Orthopedics;  Laterality: Left;   Family History  Problem Relation Age of Onset  . Cancer Mother 78       Breast  . Heart disease Mother   . Hypertension Mother   . Hyperlipidemia Mother   . Heart attack Mother   . Asthma Maternal Grandmother    Social History   Socioeconomic History  . Marital status: Widowed    Spouse name: Not on file  . Number of children: 2  . Years of education: Not on file  . Highest education level: Not on file  Occupational History  . Not on file  Social Needs  . Financial resource strain: Not on file  . Food insecurity:    Worry: Not on file    Inability: Not on file  . Transportation needs:    Medical: Not on file    Non-medical: Not on file  Tobacco Use  . Smoking status: Former Smoker    Packs/day: 0.50    Years: 32.00    Pack years: 16.00    Types: Cigarettes    Last attempt to quit: 07/22/1994    Years since quitting: 23.6  . Smokeless tobacco: Never Used  Substance and Sexual Activity  . Alcohol use: No     Alcohol/week: 0.0 oz  . Drug use: No  . Sexual activity: Not Currently  Lifestyle  . Physical activity:    Days per week: Not on file    Minutes per session: Not on file  . Stress: Not on file  Relationships  . Social connections:    Talks on phone: Not on file    Gets together: Not on file    Attends religious service: Not on file  Active member of club or organization: Not on file    Attends meetings of clubs or organizations: Not on file    Relationship status: Not on file  Other Topics Concern  . Not on file  Social History Narrative   Married 75 years and has 2 children (2 daughters)   Alcohol Use - no   Former Smoker - she quit 10 years ago, started when she was 40 and has had varying level of use of tobacco products from 1/2 pack to 3 packs per day.    Outpatient Encounter Medications as of 03/09/2018  Medication Sig  . acetaminophen (TYLENOL) 500 MG tablet Take 1,000 mg by mouth every 6 (six) hours as needed for moderate pain.  Marland Kitchen albuterol (PROVENTIL HFA;VENTOLIN HFA) 108 (90 Base) MCG/ACT inhaler Inhale 2 puffs into the lungs every 6 (six) hours as needed for wheezing or shortness of breath.  Marland Kitchen albuterol (PROVENTIL) (2.5 MG/3ML) 0.083% nebulizer solution Take 3 mLs (2.5 mg total) by nebulization every 6 (six) hours as needed for wheezing or shortness of breath.  Marland Kitchen aspirin EC 81 MG tablet Take 81 mg by mouth at bedtime.   Marland Kitchen atenolol (TENORMIN) 25 MG tablet Take 25 mg by mouth at bedtime.  Marland Kitchen atenolol (TENORMIN) 50 MG tablet TAKE 1 TABLET (50 MG TOTAL) BY MOUTH DAILY. (Patient taking differently: Take 50 mg by mouth every morning. )  . BESIVANCE 0.6 % SUSP Place 1 drop into the left eye See admin instructions. INSTILL 1 DROP INTO LEFT 4 TIMES DAILY x 2 DAYS AFTER EYE INJECTION  . budesonide-formoterol (SYMBICORT) 160-4.5 MCG/ACT inhaler Inhale 2 puffs into the lungs 2 (two) times daily.  . calcium carbonate (OSCAL) 1500 (600 Ca) MG TABS tablet Take 1,200 mg by mouth daily with  breakfast.  . Cholecalciferol (VITAMIN D) 2000 UNITS tablet Take 2,000 Units by mouth every evening.   . Cinnamon 500 MG capsule Take 2,000 mg by mouth daily.    . citalopram (CELEXA) 10 MG tablet Take 1 tablet (10 mg total) by mouth at bedtime.  . Coenzyme Q10 200 MG capsule Take 200 mg by mouth at bedtime.   . fexofenadine (ALLEGRA) 180 MG tablet Take 180 mg by mouth daily.  Marland Kitchen gabapentin (NEURONTIN) 300 MG capsule TAKE 1 CAPSULE (300 MG TOTAL) BY MOUTH AT BEDTIME.  Marland Kitchen Glucosamine HCl (GLUCOSAMINE PO) Take 1 tablet by mouth every evening.   . hydrochlorothiazide (MICROZIDE) 12.5 MG capsule Take 1 capsule (12.5 mg total) by mouth daily.  Marland Kitchen ketoconazole (NIZORAL) 2 % shampoo Apply 1 application topically 2 (two) times a week. (Patient taking differently: Apply 1 application topically every 14 (fourteen) days. ONCE EVERY 2 WEEKS PER PATIENT)  . levothyroxine (SYNTHROID, LEVOTHROID) 112 MCG tablet TAKE 1 TABLET (112 MCG TOTAL) BY MOUTH DAILY BEFORE BREAKFAST.  Marland Kitchen nystatin (MYCOSTATIN) 100000 UNIT/ML suspension Take 5 mLs (500,000 Units total) by mouth 4 (four) times daily.  . Omega-3 Fatty Acids (FISH OIL) 1200 MG CAPS Take 1,200 mg by mouth daily.   Marland Kitchen omeprazole (PRILOSEC) 40 MG capsule TAKE 1 CAPSULE (40 MG TOTAL) BY MOUTH DAILY.  . rosuvastatin (CRESTOR) 20 MG tablet TAKE 1 TABLET (20 MG TOTAL) BY MOUTH DAILY. (Patient taking differently: Take 20 mg by mouth at bedtime. )  . Selexipag (UPTRAVI) 200 MCG TABS Take 1 tablet (200 mcg total) by mouth daily.  Marland Kitchen Spacer/Aero-Holding Chambers (AEROCHAMBER PLUS WITH MASK) inhaler Use as instructed  . tadalafil, PAH, (ADCIRCA) 20 MG tablet Take 2 tablets (40 mg  total) by mouth daily.  . traMADol (ULTRAM) 50 MG tablet Take 2 tablets (100 mg total) by mouth every 6 (six) hours as needed for moderate pain or severe pain.  . vitamin B-12 (CYANOCOBALAMIN) 1000 MCG tablet Take 1,000 mcg by mouth daily.  . [DISCONTINUED] guaiFENesin (MUCINEX) 600 MG 12 hr tablet  Take 2 tablets (1,200 mg total) by mouth 2 (two) times daily.   No facility-administered encounter medications on file as of 03/09/2018.     Activities of Daily Living In your present state of health, do you have any difficulty performing the following activities: 03/09/2018 01/25/2018  Hearing? N N  Vision? N N  Difficulty concentrating or making decisions? N N  Walking or climbing stairs? Y Y  Dressing or bathing? Y Y  Comment sob -  Doing errands, shopping? N N  Preparing Food and eating ? N -  Using the Toilet? N -  In the past six months, have you accidently leaked urine? N -  Do you have problems with loss of bowel control? N -  Managing your Medications? N -  Managing your Finances? N -  Housekeeping or managing your Housekeeping? N -  Some recent data might be hidden    Patient Care Team: Mosie Lukes, MD as PCP - General (Family Medicine) Earnie Larsson, MD as Consulting Physician (Neurosurgery) Paralee Cancel, MD as Consulting Physician (Orthopedic Surgery) Parrett, Fonnie Mu, NP as Nurse Practitioner (Pulmonary Disease)    Assessment:   This is a routine wellness examination for Kimberly Irwin. Physical assessment deferred to PCP.  Exercise Activities and Dietary recommendations Current Exercise Habits: Home exercise routine, Type of exercise: walking, Time (Minutes): 15, Frequency (Times/Week): 7, Weekly Exercise (Minutes/Week): 105, Intensity: Mild, Exercise limited by: respiratory conditions(s) Diet (meal preparation, eat out, water intake, caffeinated beverages, dairy products, fruits and vegetables): well balanced   Goals    . Remain active and independent.   (pt-stated)       Fall Risk Fall Risk  03/09/2018 02/12/2018 10/21/2016 10/11/2015 04/27/2014  Falls in the past year? _0   Risk for fall due to : - - - Impaired balance/gait -  Risk for fall due to: Comment - - - Pt reports stumbling a lot.  Unable to walk in a straight line.   -     Depression Screen PHQ  2/9 Scores 03/09/2018 02/12/2018 10/21/2016 10/11/2015  PHQ - 2 Score 0 0 0 0     Cognitive Function  MMSE - Mini Mental State Exam 03/09/2018 10/21/2016 10/11/2015  Orientation to time _1 Orientation to Place _2 Registration _3 Attention/ Calculation _4 Recall _5 Language- name 2 objects _6 Language- repeat _7 Language- follow 3 step command _8 Language- read & follow direction _9 Write a sentence _10 Copy design _11 Total score _12 Immunization History  Administered Date(s) Administered  . Influenza Split 05/08/2011, 05/26/2012  . Influenza Whole 06/03/2006, 05/31/2007, 04/26/2008, 05/23/2009, 05/20/2010  . Influenza, High Dose Seasonal PF 05/23/2013, 04/03/2016, 06/03/2017  . Influenza,inj,Quad PF,6+ Mos 04/27/2014, 06/15/2015  . Pneumococcal Conjugate-13 04/27/2014  . Pneumococcal Polysaccharide-23 06/18/2011, 07/03/2016  . Tdap 06/18/2011   Screening Tests Health Maintenance  Topic Date Due  . FOOT EXAM  04/09/1956  . OPHTHALMOLOGY EXAM  04[birth year]09/1954  .  URINE MICROALBUMIN  04/28/2015  . HEMOGLOBIN A1C  12/17/2017  . INFLUENZA VACCINE  03/04/2018  . Fecal DNA (Cologuard)  07/11/2018  . MAMMOGRAM  05/26/2019  . TETANUS/TDAP  06/17/2021  . DEXA SCAN  Completed  . Hepatitis C Screening  Completed  . PNA vac Low Risk Adult  Completed       Plan:    Please schedule your next medicare wellness visit with me in 1 yr.  Continue to eat heart healthy diet (full of fruits, vegetables, whole grains, lean protein, water--limit salt, fat, and sugar intake) and increase physical activity as tolerated..  Continue doing brain stimulating activities (puzzles, reading, adult coloring books, staying active) to keep memory sharp.    I have personally reviewed and noted the following in the patient's chart:   . Medical and social history . Use of alcohol, tobacco or illicit drugs  . Current medications and supplements . Functional  ability and status . Nutritional status . Physical activity . Advanced directives . List of other physicians . Hospitalizations, surgeries, and ER visits in previous 12 months . Vitals . Screenings to include cognitive, depression, and falls . Referrals and appointments  In addition, I have reviewed and discussed with patient certain preventive protocols, quality metrics, and best practice recommendations. A written personalized care plan for preventive services as well as general preventive health recommendations were provided to patient.     Shela Nevin, [place]  03/09/2018 Medical screening examination/treatment was performed by qualified clinical staff member and as supervising physician I was immediately available for consultation/collaboration. I have reviewed documentation and agree with assessment and plan.  Penni Homans, MD

## 2018-03-08 NOTE — Telephone Encounter (Signed)
Patient Enrollment for Kimberly Irwin completed and faxed today to [facility] @ [phone].

## 2018-03-09 ENCOUNTER — Ambulatory Visit (INDEPENDENT_AMBULATORY_CARE_PROVIDER_SITE_OTHER): Payer: Medicare Other | Admitting: Pulmonary Disease

## 2018-03-09 ENCOUNTER — Ambulatory Visit (INDEPENDENT_AMBULATORY_CARE_PROVIDER_SITE_OTHER): Payer: Medicare Other | Admitting: *Deleted

## 2018-03-09 ENCOUNTER — Encounter: Payer: Self-pay | Admitting: Pulmonary Disease

## 2018-03-09 ENCOUNTER — Encounter: Payer: Self-pay | Admitting: *Deleted

## 2018-03-09 VITALS — BP 120/68 | HR 68 | Ht 63.0 in | Wt 169.4 lb

## 2018-03-09 VITALS — BP 130/70 | HR 61 | Ht 63.0 in | Wt 169.0 lb

## 2018-03-09 DIAGNOSIS — Z0000 Encounter for general adult medical examination without abnormal findings: Secondary | ICD-10-CM | POA: Diagnosis not present

## 2018-03-09 DIAGNOSIS — J439 Emphysema, unspecified: Secondary | ICD-10-CM

## 2018-03-09 DIAGNOSIS — J9611 Chronic respiratory failure with hypoxia: Secondary | ICD-10-CM | POA: Diagnosis not present

## 2018-03-09 DIAGNOSIS — I272 Pulmonary hypertension, unspecified: Secondary | ICD-10-CM

## 2018-03-09 NOTE — Patient Instructions (Signed)
COPD: Keep taking Symbicort 2 puffs twice a day no matter how you feel Use albuterol as needed for chest tightness wheezing or shortness of breath Get a flu shot in the fall Practice good hand hygiene Stay active  Chronic respiratory failure with hypoxemia: Keep using oxygen continuously as you are doing  Pulmonary hypertension: Keep taking tadalafil as you are doing I am going to talk to Dr. Aundra Dubin about using an inhaled prostacyclin rather than a tablet, we will call you once I have had that conversation  Keep a follow-up visit with me in 4 to 6 weeks.

## 2018-03-09 NOTE — Progress Notes (Signed)
Subjective:   PATIENT ID: Kimberly Irwin GENDER: female DOB: Feb 11, 1946, MRN: [id number]   Synopsis: Former patient of Dr. Joya Gaskins and Dr. Corrie Dandy who has sarcoidosis and centrilobular emphysema.  HPI  Chief Complaint  Patient presents with  . Follow-up    shortness of breath   Kimberly Irwin continues to complain of dyspnea on a daily basis.  She says she also has a lot of trouble with sinus congestion and mucus production.  She is not coughing up more mucus from her chest.  She is not wheezing more.  She is compliant with her tadalafil.  She is compliant with her Symbicort.  She stopped taking the Opsumit because it made her swell more, gave her headaches, and chills.  She says that most of the symptoms have resolved.  She is remaining compliant with her oxygen continuously.  Past Medical History:  Diagnosis Date  . Bursitis of left hip 10/26/2012  . Chronic low back pain   . Chronic respiratory failure with hypoxia ([place]) 10/21/2016  . Coronary artery calcification 07/03/2016  . DDD (degenerative disc disease)    L3-4 with facet arthropathy and stenosis  . Degenerative spondylolisthesis    L4-5 grade 1 with stenosis  . Emphysema lung ([place])   . GERD (gastroesophageal reflux disease)   . Hiatal hernia   . Hyperlipidemia   . Hypertension   . Hypothyroidism 10/28/2015  . Pain in joint, lower leg 10/26/2012   left   . Pneumonia    as a child several times.   . Pulmonary hypertension ([place])   . Sarcoidosis   . Tremors of nervous system         Review of Systems  Constitutional: Negative for chills, diaphoresis and malaise/fatigue.  HENT: Negative for congestion and ear discharge.   Respiratory: Positive for shortness of breath. Negative for cough, sputum production, wheezing and stridor.   Cardiovascular: Negative for chest pain, palpitations, claudication and leg swelling.      Objective:  Physical Exam   Vitals:   03/09/18 1605  BP: 130/70  Pulse: 61  Weight: 169 lb  (76.7 kg)  Height: _0  (1.6 m)   3Lpm Pulse at rest   Gen: chronically ill appearing HENT: OP clear, TM's clear, neck supple PULM: CTA B, normal percussion CV: RRR, no mgr, trace edema GI: BS+, soft, nontender Derm: no cyanosis or rash Psyche: normal mood and affect     CBC    Component Value Date/Time   WBC 14.0 (H) 01/27/2018 0618   RBC 4.55 01/27/2018 0618   HGB 13.7 01/27/2018 0618   HCT 42.0 01/27/2018 0618   PLT 205 01/27/2018 0618   MCV 92.3 01/27/2018 0618   MCH 30.1 01/27/2018 0618   MCHC 32.6 01/27/2018 0618   RDW 13.9 01/27/2018 0618   LYMPHSABS 1.2 01/27/2018 0618   MONOABS 1.2 (H) 01/27/2018 0618   EOSABS 0.0 01/27/2018 0618   BASOSABS 0.0 01/27/2018 0618     Chest imaging: CT chest October 2017 showed no PE . Mild emphysematous changes.  Images independently reviewed by me showing some air trapping and mosaicism.  Some cysts noted in the lower lobes. March 2019 high-resolution CT scan of the chest images independently reviewed, nonspecific interstitial changes in the right greater than left base which are very mild in distribution and overall severity, upper lobe predominant mild to moderate centrilobular emphysema noted, bilateral pulmonary nodules, largest 5 mm, aortic atherosclerosis noted 12/2017 V/Q Scan IMPRESSION: Normal perfusion lung scan.  Minimally diminished ventilation to the RIGHT apex.  PFT: PFT (05/2016) Ratio 70% FEV1 1.59 72%, DLCO 35% (flow volume loop consistent with airflow obstruction) PFT (10/2017) ratio 71%, FEV1 1.83 L 84%, FVC 2.43 L 84%, total lung capacity 3.83 L 75%, DLCO 6.15 25%  Labs:  Path:  Echo: 2-D echo on 04/09/2016 showed an EF of 83-66%, grade 1 diastolic dysfunction. Pulmonary artery pressure 44 mmHg  Heart Catheterization: 11/2017 RHC RA mean 2 RV 66/5 PA 66/16, mean 34 PCWP mean 3 Oxygen saturations: PA 60% AO 92% Cardiac Output (Fick) 3.73 Cardiac Index (Fick) 2.04 PVR 8.3 WU  Exhaled NO: FeNO is  20ppm 05/2016   Other: Stress Myoview 2011 neg for ischemia .  Records reviewed from her visit with cardiology on July 30 where OPSUMIT was stopped, efforts were made to start [place]:   No diagnosis found.  Discussion: Shiloh has [employer] Group 1 and group 5 pulmonary hypertension.  She recently was intolerant of endothelin receptor antagonist, so in an attempt to treat her with combination therapy a prostacyclin tablet has been prescribed.  I think this is reasonable, though it may be better to use an inhaled form considering the centrilobular emphysema in the upper lobes of her lungs.  I will discuss this with Dr. Aundra Dubin.  Plan: COPD: Keep taking Symbicort 2 puffs twice a day no matter how you feel Use albuterol as needed for chest tightness wheezing or shortness of breath Get a flu shot in the fall Practice good hand hygiene Stay active  Chronic respiratory failure with hypoxemia: Keep using oxygen continuously as you are doing  Pulmonary hypertension: Keep taking tadalafil as you are doing I am going to talk to Dr. Aundra Dubin about using an inhaled prostacyclin rather than a tablet, we will call you once I have had that conversation  Keep a follow-up visit with me in 4 to 6 weeks.  Current Outpatient Medications:  .  acetaminophen (TYLENOL) 500 MG tablet, Take 1,000 mg by mouth every 6 (six) hours as needed for moderate pain., Disp: , Rfl:  .  albuterol (PROVENTIL HFA;VENTOLIN HFA) 108 (90 Base) MCG/ACT inhaler, Inhale 2 puffs into the lungs every 6 (six) hours as needed for wheezing or shortness of breath., Disp: 1 Inhaler, Rfl: 6 .  albuterol (PROVENTIL) (2.5 MG/3ML) 0.083% nebulizer solution, Take 3 mLs (2.5 mg total) by nebulization every 6 (six) hours as needed for wheezing or shortness of breath., Disp: 30 mL, Rfl: 4 .  aspirin EC 81 MG tablet, Take 81 mg by mouth at bedtime. , Disp: , Rfl:  .  atenolol (TENORMIN) 25 MG tablet, Take 25  mg by mouth at bedtime., Disp: , Rfl:  .  atenolol (TENORMIN) 50 MG tablet, TAKE 1 TABLET (50 MG TOTAL) BY MOUTH DAILY. (Patient taking differently: Take 50 mg by mouth every morning. ), Disp: 90 tablet, Rfl: 1 .  BESIVANCE 0.6 % SUSP, Place 1 drop into the left eye See admin instructions. INSTILL 1 DROP INTO LEFT 4 TIMES DAILY x 2 DAYS AFTER EYE INJECTION, Disp: , Rfl: 12 .  budesonide-formoterol (SYMBICORT) 160-4.5 MCG/ACT inhaler, Inhale 2 puffs into the lungs 2 (two) times daily., Disp: 1 Inhaler, Rfl: 0 .  calcium carbonate (OSCAL) 1500 (600 Ca) MG TABS tablet, Take 1,200 mg by mouth daily with breakfast., Disp: , Rfl:  .  Cholecalciferol (VITAMIN D) 2000 UNITS tablet, Take 2,000 Units by mouth every evening. , Disp: , Rfl:  .  Cinnamon 500 MG capsule, Take 2,000 mg by mouth daily.  , Disp: , Rfl:  .  citalopram (CELEXA) 10 MG tablet, Take 1 tablet (10 mg total) by mouth at bedtime., Disp: 30 tablet, Rfl: 1 .  Coenzyme Q10 200 MG capsule, Take 200 mg by mouth at bedtime. , Disp: , Rfl:  .  fexofenadine (ALLEGRA) 180 MG tablet, Take 180 mg by mouth daily., Disp: , Rfl:  .  gabapentin (NEURONTIN) 300 MG capsule, TAKE 1 CAPSULE (300 MG TOTAL) BY MOUTH AT BEDTIME., Disp: 90 capsule, Rfl: 2 .  Glucosamine HCl (GLUCOSAMINE PO), Take 1 tablet by mouth every evening. , Disp: , Rfl:  .  hydrochlorothiazide (MICROZIDE) 12.5 MG capsule, Take 1 capsule (12.5 mg total) by mouth daily., Disp: 90 capsule, Rfl: 3 .  ketoconazole (NIZORAL) 2 % shampoo, Apply 1 application topically 2 (two) times a week. (Patient taking differently: Apply 1 application topically every 14 (fourteen) days. ONCE EVERY 2 WEEKS PER PATIENT), Disp: 120 mL, Rfl: 03 .  levothyroxine (SYNTHROID, LEVOTHROID) 112 MCG tablet, TAKE 1 TABLET (112 MCG TOTAL) BY MOUTH DAILY BEFORE BREAKFAST., Disp: 90 tablet, Rfl: 1 .  nystatin (MYCOSTATIN) 100000 UNIT/ML suspension, Take 5 mLs (500,000 Units total) by mouth 4 (four) times daily., Disp: 60 mL,  Rfl: 0 .  Omega-3 Fatty Acids (FISH OIL) 1200 MG CAPS, Take 1,200 mg by mouth daily. , Disp: , Rfl:  .  omeprazole (PRILOSEC) 40 MG capsule, TAKE 1 CAPSULE (40 MG TOTAL) BY MOUTH DAILY., Disp: 90 capsule, Rfl: 1 .  rosuvastatin (CRESTOR) 20 MG tablet, TAKE 1 TABLET (20 MG TOTAL) BY MOUTH DAILY. (Patient taking differently: Take 20 mg by mouth at bedtime. ), Disp: 90 tablet, Rfl: 2 .  Selexipag (UPTRAVI) 200 MCG TABS, Take 1 tablet (200 mcg total) by mouth daily., Disp: 60 tablet, Rfl: 11 .  Spacer/Aero-Holding Chambers (AEROCHAMBER PLUS WITH MASK) inhaler, Use as instructed, Disp: 1 each, Rfl: 2 .  tadalafil, PAH, (ADCIRCA) 20 MG tablet, Take 2 tablets (40 mg total) by mouth daily., Disp: 60 tablet, Rfl: 11 .  traMADol (ULTRAM) 50 MG tablet, Take 2 tablets (100 mg total) by mouth every 6 (six) hours as needed for moderate pain or severe pain., Disp: 120 tablet, Rfl: 0 .  vitamin B-12 (CYANOCOBALAMIN) 1000 MCG tablet, Take 1,000 mcg by mouth daily., Disp: , Rfl:

## 2018-03-09 NOTE — Patient Instructions (Signed)
Please schedule your next medicare wellness visit with me in 1 yr.  Continue to eat heart healthy diet (full of fruits, vegetables, whole grains, lean protein, water--limit salt, fat, and sugar intake) and increase physical activity as tolerated..  Continue doing brain stimulating activities (puzzles, reading, adult coloring books, staying active) to keep memory sharp.   Kimberly Irwin , Thank you for taking time to come for your Medicare Wellness Visit. I appreciate your ongoing commitment to your health goals. Please review the following plan we discussed and let me know if I can assist you in the future.   These are the goals we discussed: Goals    . Remain active and independent.   (pt-stated)       This is a list of the screening recommended for you and due dates:  Health Maintenance  Topic Date Due  . Complete foot exam   04/09/1956  . Eye exam for diabetics  04/09/1956  . Urine Protein Check  04/28/2015  . Hemoglobin A1C  12/17/2017  . Flu Shot  03/04/2018  . Cologuard (Stool DNA test)  07/11/2018  . Mammogram  05/26/2019  . Tetanus Vaccine  06/17/2021  . DEXA scan (bone density measurement)  Completed  .  Hepatitis C: One time screening is recommended by Center for Disease Control  (CDC) for  adults born from [birth year] through 1967.   Completed  . Pneumonia vaccines  Completed    Health Maintenance for Postmenopausal Women Menopause is a normal process in which your reproductive ability comes to an end. This process happens gradually over a span of months to years, usually between the ages of 7 and 40. Menopause is complete when you have missed 12 consecutive menstrual periods. It is important to talk with your health care provider about some of the most common conditions that affect postmenopausal women, such as heart disease, cancer, and bone loss (osteoporosis). Adopting a healthy lifestyle and getting preventive care can help to promote your health and wellness. Those actions can  also lower your chances of developing some of these common conditions. What should I know about menopause? During menopause, you may experience a number of symptoms, such as:  Moderate-to-severe hot flashes.  Night sweats.  Decrease in sex drive.  Mood swings.  Headaches.  Tiredness.  Irritability.  Memory problems.  Insomnia.  Choosing to treat or not to treat menopausal changes is an individual decision that you make with your health care provider. What should I know about hormone replacement therapy and supplements? Hormone therapy products are effective for treating symptoms that are associated with menopause, such as hot flashes and night sweats. Hormone replacement carries certain risks, especially as you become older. If you are thinking about using estrogen or estrogen with progestin treatments, discuss the benefits and risks with your health care provider. What should I know about heart disease and stroke? Heart disease, heart attack, and stroke become more likely as you age. This may be due, in part, to the hormonal changes that your body experiences during menopause. These can affect how your body processes dietary fats, triglycerides, and cholesterol. Heart attack and stroke are both medical emergencies. There are many things that you can do to help prevent heart disease and stroke:  Have your blood pressure checked at least every 1-2 years. High blood pressure causes heart disease and increases the risk of stroke.  If you are 65-25 years old, ask your health care provider if you should take aspirin to prevent a heart  attack or a stroke.  Do not use any tobacco products, including cigarettes, chewing tobacco, or electronic cigarettes. If you need help quitting, ask your health care provider.  It is important to eat a healthy diet and maintain a healthy weight. ? Be sure to include plenty of vegetables, fruits, low-fat dairy products, and lean protein. ? Avoid eating  foods that are high in solid fats, added sugars, or salt (sodium).  Get regular exercise. This is one of the most important things that you can do for your health. ? Try to exercise for at least 150 minutes each week. The type of exercise that you do should increase your heart rate and make you sweat. This is known as moderate-intensity exercise. ? Try to do strengthening exercises at least twice each week. Do these in addition to the moderate-intensity exercise.  Know your numbers.Ask your health care provider to check your cholesterol and your blood glucose. Continue to have your blood tested as directed by your health care provider.  What should I know about cancer screening? There are several types of cancer. Take the following steps to reduce your risk and to catch any cancer development as early as possible. Breast Cancer  Practice breast self-awareness. ? This means understanding how your breasts normally appear and feel. ? It also means doing regular breast self-exams. Let your health care provider know about any changes, no matter how small.  If you are 56 or older, have a clinician do a breast exam (clinical breast exam or CBE) every year. Depending on your age, family history, and medical history, it may be recommended that you also have a yearly breast X-ray (mammogram).  If you have a family history of breast cancer, talk with your health care provider about genetic screening.  If you are at high risk for breast cancer, talk with your health care provider about having an MRI and a mammogram every year.  Breast cancer (BRCA) gene test is recommended for women who have family members with BRCA-related cancers. Results of the assessment will determine the need for genetic counseling and BRCA1 and for BRCA2 testing. BRCA-related cancers include these types: ? Breast. This occurs in males or females. ? Ovarian. ? Tubal. This may also be called fallopian tube cancer. ? Cancer of the  abdominal or pelvic lining (peritoneal cancer). ? Prostate. ? Pancreatic.  Cervical, Uterine, and Ovarian Cancer Your health care provider may recommend that you be screened regularly for cancer of the pelvic organs. These include your ovaries, uterus, and vagina. This screening involves a pelvic exam, which includes checking for microscopic changes to the surface of your cervix (Pap test).  For women ages 21-65, health care providers may recommend a pelvic exam and a Pap test every three years. For women ages 51-65, they may recommend the Pap test and pelvic exam, combined with testing for human papilloma virus (HPV), every five years. Some types of HPV increase your risk of cervical cancer. Testing for HPV may also be done on women of any age who have unclear Pap test results.  Other health care providers may not recommend any screening for nonpregnant women who are considered low risk for pelvic cancer and have no symptoms. Ask your health care provider if a screening pelvic exam is right for you.  If you have had past treatment for cervical cancer or a condition that could lead to cancer, you need Pap tests and screening for cancer for at least 20 years after your treatment. If  Pap tests have been discontinued for you, your risk factors (such as having a new sexual partner) need to be reassessed to determine if you should start having screenings again. Some women have medical problems that increase the chance of getting cervical cancer. In these cases, your health care provider may recommend that you have screening and Pap tests more often.  If you have a family history of uterine cancer or ovarian cancer, talk with your health care provider about genetic screening.  If you have vaginal bleeding after reaching menopause, tell your health care provider.  There are currently no reliable tests available to screen for ovarian cancer.  Lung Cancer Lung cancer screening is recommended for adults  82-67 years old who are at high risk for lung cancer because of a history of smoking. A yearly low-dose CT scan of the lungs is recommended if you:  Currently smoke.  Have a history of at least 30 pack-years of smoking and you currently smoke or have quit within the past 15 years. A pack-year is smoking an average of one pack of cigarettes per day for one year.  Yearly screening should:  Continue until it has been 15 years since you quit.  Stop if you develop a health problem that would prevent you from having lung cancer treatment.  Colorectal Cancer  This type of cancer can be detected and can often be prevented.  Routine colorectal cancer screening usually begins at age 18 and continues through age 82.  If you have risk factors for colon cancer, your health care provider may recommend that you be screened at an earlier age.  If you have a family history of colorectal cancer, talk with your health care provider about genetic screening.  Your health care provider may also recommend using home test kits to check for hidden blood in your stool.  A small camera at the end of a tube can be used to examine your colon directly (sigmoidoscopy or colonoscopy). This is done to check for the earliest forms of colorectal cancer.  Direct examination of the colon should be repeated every 5-10 years until age 20. However, if early forms of precancerous polyps or small growths are found or if you have a family history or genetic risk for colorectal cancer, you may need to be screened more often.  Skin Cancer  Check your skin from head to toe regularly.  Monitor any moles. Be sure to tell your health care provider: ? About any new moles or changes in moles, especially if there is a change in a mole's shape or color. ? If you have a mole that is larger than the size of a pencil eraser.  If any of your family members has a history of skin cancer, especially at a young age, talk with your health  care provider about genetic screening.  Always use sunscreen. Apply sunscreen liberally and repeatedly throughout the day.  Whenever you are outside, protect yourself by wearing long sleeves, pants, a wide-brimmed hat, and sunglasses.  What should I know about osteoporosis? Osteoporosis is a condition in which bone destruction happens more quickly than new bone creation. After menopause, you may be at an increased risk for osteoporosis. To help prevent osteoporosis or the bone fractures that can happen because of osteoporosis, the following is recommended:  If you are 80-61 years old, get at least 1,000 mg of calcium and at least 600 mg of vitamin D per day.  If you are older than age 29 but  younger than age 39, get at least 1,200 mg of calcium and at least 600 mg of vitamin D per day.  If you are older than age 53, get at least 1,200 mg of calcium and at least 800 mg of vitamin D per day.  Smoking and excessive alcohol intake increase the risk of osteoporosis. Eat foods that are rich in calcium and vitamin D, and do weight-bearing exercises several times each week as directed by your health care provider. What should I know about how menopause affects my mental health? Depression may occur at any age, but it is more common as you become older. Common symptoms of depression include:  Low or sad mood.  Changes in sleep patterns.  Changes in appetite or eating patterns.  Feeling an overall lack of motivation or enjoyment of activities that you previously enjoyed.  Frequent crying spells.  Talk with your health care provider if you think that you are experiencing depression. What should I know about immunizations? It is important that you get and maintain your immunizations. These include:  Tetanus, diphtheria, and pertussis (Tdap) booster vaccine.  Influenza every year before the flu season begins.  Pneumonia vaccine.  Shingles vaccine.  Your health care provider may also  recommend other immunizations. This information is not intended to replace advice given to you by your health care provider. Make sure you discuss any questions you have with your health care provider. Document Released: 09/12/2005 Document Revised: 02/08/2016 Document Reviewed: 04/24/2015 Elsevier Interactive Patient Education  2018 [employer].

## 2018-03-15 ENCOUNTER — Telehealth: Payer: Self-pay

## 2018-03-15 DIAGNOSIS — I272 Pulmonary hypertension, unspecified: Secondary | ICD-10-CM | POA: Diagnosis not present

## 2018-03-15 DIAGNOSIS — G894 Chronic pain syndrome: Secondary | ICD-10-CM | POA: Diagnosis not present

## 2018-03-15 DIAGNOSIS — I10 Essential (primary) hypertension: Secondary | ICD-10-CM | POA: Diagnosis not present

## 2018-03-15 DIAGNOSIS — D860 Sarcoidosis of lung: Secondary | ICD-10-CM | POA: Diagnosis not present

## 2018-03-15 DIAGNOSIS — J9611 Chronic respiratory failure with hypoxia: Secondary | ICD-10-CM | POA: Diagnosis not present

## 2018-03-15 DIAGNOSIS — J441 Chronic obstructive pulmonary disease with (acute) exacerbation: Secondary | ICD-10-CM | POA: Diagnosis not present

## 2018-03-15 NOTE — Telephone Encounter (Signed)
-----  Message from Mosie Lukes, MD sent at 03/07/2018  3:57 PM EDT ----- She agreed to Palliative care consultation and she may be a good [facility] candidate. Would you check with them and see if [facility] will take her and set her up with palliative care. If they will not then we need to refer her to palliative care would stat with [facility] for COPD

## 2018-03-18 ENCOUNTER — Telehealth (HOSPITAL_COMMUNITY): Payer: Self-pay | Admitting: Pharmacist

## 2018-03-18 NOTE — Telephone Encounter (Signed)
Uptravi 200 mcg PA approved by [employer] through 08/03/18.   Ruta Hinds. Velva Harman, PharmD, BCPS, CPP Clinical Pharmacist Phone: [phone] 03/18/2018 11:17 AM

## 2018-03-19 ENCOUNTER — Telehealth (HOSPITAL_COMMUNITY): Payer: Self-pay | Admitting: Pharmacist

## 2018-03-19 NOTE — Telephone Encounter (Signed)
Uptravi 200, 400, 600, 1000, 1400 mcg PA approved by [employer] through 08/03/18.   Ruta Hinds. Velva Harman, PharmD, BCPS, CPP Clinical Pharmacist Phone: [phone] 03/19/2018 9:56 AM

## 2018-03-22 ENCOUNTER — Other Ambulatory Visit (HOSPITAL_COMMUNITY): Payer: Self-pay | Admitting: *Deleted

## 2018-03-22 MED ORDER — HYDROCHLOROTHIAZIDE 12.5 MG PO CAPS: 12.5000 mg | ORAL_CAPSULE | Freq: Every day | ORAL | 3 refills | Status: DC

## 2018-03-31 ENCOUNTER — Telehealth: Payer: Self-pay | Admitting: *Deleted

## 2018-03-31 DIAGNOSIS — I10 Essential (primary) hypertension: Secondary | ICD-10-CM | POA: Diagnosis not present

## 2018-03-31 DIAGNOSIS — J441 Chronic obstructive pulmonary disease with (acute) exacerbation: Secondary | ICD-10-CM | POA: Diagnosis not present

## 2018-03-31 DIAGNOSIS — G894 Chronic pain syndrome: Secondary | ICD-10-CM | POA: Diagnosis not present

## 2018-03-31 DIAGNOSIS — D860 Sarcoidosis of lung: Secondary | ICD-10-CM | POA: Diagnosis not present

## 2018-03-31 DIAGNOSIS — I272 Pulmonary hypertension, unspecified: Secondary | ICD-10-CM | POA: Diagnosis not present

## 2018-03-31 DIAGNOSIS — J9611 Chronic respiratory failure with hypoxia: Secondary | ICD-10-CM | POA: Diagnosis not present

## 2018-03-31 NOTE — Telephone Encounter (Signed)
[name] 03/31/2018 12:38 PM  [name] nurse w/[facility] [phone] wanted the provider to know he will be discharging the patient after 3 more visit.  One visit every other week over a 6 week time span.

## 2018-04-01 DIAGNOSIS — Z8701 Personal history of pneumonia (recurrent): Secondary | ICD-10-CM | POA: Diagnosis not present

## 2018-04-01 DIAGNOSIS — J9611 Chronic respiratory failure with hypoxia: Secondary | ICD-10-CM | POA: Diagnosis not present

## 2018-04-01 DIAGNOSIS — K219 Gastro-esophageal reflux disease without esophagitis: Secondary | ICD-10-CM | POA: Diagnosis not present

## 2018-04-01 DIAGNOSIS — Z7982 Long term (current) use of aspirin: Secondary | ICD-10-CM | POA: Diagnosis not present

## 2018-04-01 DIAGNOSIS — Z87891 Personal history of nicotine dependence: Secondary | ICD-10-CM | POA: Diagnosis not present

## 2018-04-01 DIAGNOSIS — J449 Chronic obstructive pulmonary disease, unspecified: Secondary | ICD-10-CM | POA: Diagnosis not present

## 2018-04-01 DIAGNOSIS — R251 Tremor, unspecified: Secondary | ICD-10-CM | POA: Diagnosis not present

## 2018-04-01 DIAGNOSIS — E669 Obesity, unspecified: Secondary | ICD-10-CM | POA: Diagnosis not present

## 2018-04-01 DIAGNOSIS — M5136 Other intervertebral disc degeneration, lumbar region: Secondary | ICD-10-CM | POA: Diagnosis not present

## 2018-04-01 DIAGNOSIS — Z9181 History of falling: Secondary | ICD-10-CM | POA: Diagnosis not present

## 2018-04-01 DIAGNOSIS — Z9981 Dependence on supplemental oxygen: Secondary | ICD-10-CM | POA: Diagnosis not present

## 2018-04-01 DIAGNOSIS — D860 Sarcoidosis of lung: Secondary | ICD-10-CM | POA: Diagnosis not present

## 2018-04-01 DIAGNOSIS — G894 Chronic pain syndrome: Secondary | ICD-10-CM | POA: Diagnosis not present

## 2018-04-01 DIAGNOSIS — I10 Essential (primary) hypertension: Secondary | ICD-10-CM | POA: Diagnosis not present

## 2018-04-01 DIAGNOSIS — F329 Major depressive disorder, single episode, unspecified: Secondary | ICD-10-CM | POA: Diagnosis not present

## 2018-04-01 DIAGNOSIS — E785 Hyperlipidemia, unspecified: Secondary | ICD-10-CM | POA: Diagnosis not present

## 2018-04-01 DIAGNOSIS — I272 Pulmonary hypertension, unspecified: Secondary | ICD-10-CM | POA: Diagnosis not present

## 2018-04-01 DIAGNOSIS — E039 Hypothyroidism, unspecified: Secondary | ICD-10-CM | POA: Diagnosis not present

## 2018-04-01 DIAGNOSIS — Z96642 Presence of left artificial hip joint: Secondary | ICD-10-CM | POA: Diagnosis not present

## 2018-04-01 DIAGNOSIS — Z7951 Long term (current) use of inhaled steroids: Secondary | ICD-10-CM | POA: Diagnosis not present

## 2018-04-01 DIAGNOSIS — Z7952 Long term (current) use of systemic steroids: Secondary | ICD-10-CM | POA: Diagnosis not present

## 2018-04-02 ENCOUNTER — Other Ambulatory Visit: Payer: Self-pay | Admitting: Family Medicine

## 2018-04-05 ENCOUNTER — Other Ambulatory Visit: Payer: Self-pay | Admitting: Family Medicine

## 2018-04-07 ENCOUNTER — Other Ambulatory Visit: Payer: Self-pay | Admitting: Cardiology

## 2018-04-07 DIAGNOSIS — J9611 Chronic respiratory failure with hypoxia: Secondary | ICD-10-CM | POA: Diagnosis not present

## 2018-04-07 DIAGNOSIS — I272 Pulmonary hypertension, unspecified: Secondary | ICD-10-CM | POA: Diagnosis not present

## 2018-04-07 DIAGNOSIS — I10 Essential (primary) hypertension: Secondary | ICD-10-CM | POA: Diagnosis not present

## 2018-04-07 DIAGNOSIS — I5022 Chronic systolic (congestive) heart failure: Secondary | ICD-10-CM | POA: Diagnosis not present

## 2018-04-07 DIAGNOSIS — D860 Sarcoidosis of lung: Secondary | ICD-10-CM | POA: Diagnosis not present

## 2018-04-07 DIAGNOSIS — G894 Chronic pain syndrome: Secondary | ICD-10-CM | POA: Diagnosis not present

## 2018-04-07 DIAGNOSIS — J449 Chronic obstructive pulmonary disease, unspecified: Secondary | ICD-10-CM | POA: Diagnosis not present

## 2018-04-08 LAB — BASIC METABOLIC PANEL
BUN / CREAT RATIO: 12 (ref 12–28)
BUN: 14 mg/dL (ref 8–27)
CALCIUM: 9.6 mg/dL (ref 8.7–10.3)
CO2: 25 mmol/L (ref 20–29)
CREATININE: 1.18 mg/dL — AB (ref 0.57–1.00)
Chloride: 100 mmol/L (ref 96–106)
GFR calc non Af Amer: 46 mL/min/{1.73_m2} — ABNORMAL LOW (ref >59)
GFR, EST AFRICAN AMERICAN: 53 mL/min/{1.73_m2} — AB (ref >59)
Glucose: 140 mg/dL — ABNORMAL HIGH (ref 65–99)
Potassium: 4.6 mmol/L (ref 3.5–5.2)
Sodium: 140 mmol/L (ref 134–144)

## 2018-04-08 LAB — SPECIMEN STATUS REPORT

## 2018-04-12 ENCOUNTER — Encounter: Payer: Self-pay | Admitting: Pulmonary Disease

## 2018-04-12 ENCOUNTER — Ambulatory Visit (INDEPENDENT_AMBULATORY_CARE_PROVIDER_SITE_OTHER): Payer: Medicare Other | Admitting: Pulmonary Disease

## 2018-04-12 VITALS — BP 114/76 | HR 47 | Ht 63.0 in

## 2018-04-12 DIAGNOSIS — J439 Emphysema, unspecified: Secondary | ICD-10-CM

## 2018-04-12 DIAGNOSIS — I272 Pulmonary hypertension, unspecified: Secondary | ICD-10-CM | POA: Diagnosis not present

## 2018-04-12 DIAGNOSIS — J9611 Chronic respiratory failure with hypoxia: Secondary | ICD-10-CM | POA: Diagnosis not present

## 2018-04-12 DIAGNOSIS — Z23 Encounter for immunization: Secondary | ICD-10-CM | POA: Diagnosis not present

## 2018-04-12 NOTE — Patient Instructions (Signed)
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 Otherwise, continue the dose escalation schedule as arranged Continue tadalafil as you are doing  Chronic respiratory failure with hypoxemia: Continue oxygen as you are doing  Follow-up with me in 4 to 6 weeks or sooner if needed

## 2018-04-12 NOTE — Progress Notes (Signed)
Subjective:   PATIENT ID: Kimberly Irwin GENDER: female DOB: 06-Jan-1946, MRN: [id number]   Synopsis: Former patient of Dr. Joya Gaskins and Dr. Corrie Dandy who has sarcoidosis and centrilobular emphysema.  She was diagnosed with WHO group 1 and 5 pulmonary hypertension in 2019.   HPI  Chief Complaint  Patient presents with  . Follow-up    4 week f/u. Patient states her breathing is about the same since last visit.    [place] started on the [place].  She has been taking 400 mcb bid and is planning to increase to 621mg bid tomorrow. She has had some dizziness with exerting herself but not with standing. No headache or jaw pain.   She has noticed that she still has some dyspnea when she exerts herself.   She denies cough or mucus production. She s   Past Medical History:  Diagnosis Date  . Bursitis of left hip 10/26/2012  . Chronic low back pain   . [place]/21[place]  . Coronary artery calcification 07/03/2016  . DDD (degenerative disc disease)    L3-4 with facet arthropathy and stenosis  . Degenerative spondylolisthesis    L4-5 grade 1 with stenosis  . [place]sema lung [place]   . GERD (gastroesophageal reflux disease)   . Hiatal hernia   . Hyperlipidemia   . Hypertension   . Hypothyroidism 10/28/2015  . Pain in joint, lower leg 10/26/2012   left   . Pneumonia    as a child several times.   . [place]monary [place]   . Sarcoidosis   . Tremors of nervous system         Review of Systems  Constitutional: Negative for chills, diaphoresis and malaise/fatigue.  HENT: Negative for congestion and ear discharge.   Respiratory: Positive for shortness of breath. Negative for cough, sputum production, wheezing and stridor.   Cardiovascular: Negative for chest pain, palpitations, claudication and leg swelling.      Objective:  Physical Exam   Vitals:   04/12/18 1451  BP: 114/76  Pulse: (!) 47  SpO2: 92%  Height: 5' 3" (1.6 m)    3Lpm Pulse at rest   Gen: chronically ill appearing HENT: OP clear, TM's clear, neck supple PULM: CTA B, normal percussion CV: RRR, no mgr, trace edema GI: BS+, soft, nontender Derm: no cyanosis or rash Psyche: normal mood and affect    CBC    Component Value Date/Time   WBC 14.0 (H) 01/27/2018 0618   RBC 4.55 01/27/2018 0618   HGB 13.7 01/27/2018 0618   HCT 42.0 01/27/2018 0618   PLT 205 01/27/2018 0618   MCV 92.3 01/27/2018 0618   MCH 30.1 01/27/2018 0618   MCHC 32.6 01/27/2018 0618   RDW 13.9 01/27/2018 0618   LYMPHSABS 1.2 01/27/2018 0618   MONOABS 1.2 (H) 01/27/2018 0618   EOSABS 0.0 01/27/2018 0618   BASOSABS 0.0 01/27/2018 0618     Chest imaging: CT chest October 2017 showed no PE . Mild emphysematous changes.  Images independently reviewed by me showing some air trapping and mosaicism.  Some cysts noted in the lower lobes. March 2019 high-resolution CT scan of the chest images independently reviewed, nonspecific interstitial changes in the right greater than left base which are very mild in distribution and overall severity, upper lobe predominant mild to moderate centrilobular emphysema noted, bilateral pulmonary nodules, largest 5 mm, aortic atherosclerosis noted 12/2017 V/Q Scan IMPRESSION:Normal perfusion lung scan. Minimally diminished ventilation to the RIGHT  apex.  PFT: PFT (05/2016) Ratio 70% FEV1 1.59 72%, DLCO 35% (flow volume loop consistent with airflow obstruction) PFT (10/2017) ratio 71%, FEV1 1.83 L 84%, FVC 2.43 L 84%, total lung capacity 3.83 L 75%, DLCO 6.15 25%  Labs:  Path:  Echo: 2-D echo on 04/09/2016 showed an EF of 63-87%, grade 1 diastolic dysfunction. Pulmonary artery pressure 44 mmHg  Heart Catheterization: 11/2017 RHC RA mean 2 RV 66/5 PA 66/16, mean 34 PCWP mean 3 Oxygen saturations: PA 60% AO 92% Cardiac Output (Fick) 3.73 Cardiac Index (Fick) 2.04 PVR 8.3 WU  Exhaled NO: FeNO is 20ppm 05/2016   Other: Stress Myoview  2011 neg for ischemia .  Records reviewed from her visit with cardiology on July 30 where OPSUMIT was stopped, efforts were made to start [facility] from April 07, 2018 reviewed, chemistry was within normal limits    Assessment & Plan:   No diagnosis found.  Discussion: Kimberly Irwin seems to be tolerating the dose escalation of Uptravi without too many side effects.  The dizziness she is experiencing I believe is predominantly due to the severity of her pulmonary hypertension and not a side effect from the drug.  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 Otherwise, continue the dose escalation schedule as arranged Continue tadalafil as you are doing  Chronic respiratory failure with hypoxemia: Continue oxygen as you are doing  Follow-up with me in 4 to 6 weeks or sooner if needed   Current Outpatient Medications:  .  acetaminophen (TYLENOL) 500 MG tablet, Take 1,000 mg by mouth every 6 (six) hours as needed for moderate pain., Disp: , Rfl:  .  albuterol (PROVENTIL HFA;VENTOLIN HFA) 108 (90 Base) MCG/ACT inhaler, Inhale 2 puffs into the lungs every 6 (six) hours as needed for wheezing or shortness of breath., Disp: 1 Inhaler, Rfl: 6 .  albuterol (PROVENTIL) (2.5 MG/3ML) 0.083% nebulizer solution, Take 3 mLs (2.5 mg total) by nebulization every 6 (six) hours as needed for wheezing or shortness of breath., Disp: 30 mL, Rfl: 4 .  aspirin EC 81 MG tablet, Take 81 mg by mouth at bedtime. , Disp: , Rfl:  .  atenolol (TENORMIN) 25 MG tablet, Take 25 mg by mouth at bedtime., Disp: , Rfl:  .  atenolol (TENORMIN) 50 MG tablet, TAKE 1 TABLET (50 MG TOTAL) BY MOUTH DAILY. (Patient taking differently: Take 50 mg by mouth  every morning. ), Disp: 90 tablet, Rfl: 1 .  BESIVANCE 0.6 % SUSP, Place 1 drop into the left eye See admin instructions. INSTILL 1 DROP INTO LEFT 4 TIMES DAILY x 2 DAYS AFTER EYE INJECTION, Disp: , Rfl: 12 .  budesonide-formoterol (SYMBICORT) 160-4.5 MCG/ACT inhaler, Inhale 2 puffs into the lungs 2 (two) times daily., Disp: 1 Inhaler, Rfl: 0 .  calcium carbonate (OSCAL) 1500 (600 Ca) MG TABS tablet, Take 1,200 mg by mouth daily with breakfast., Disp: , Rfl:  .  Cholecalciferol (VITAMIN D) 2000 UNITS tablet, Take 2,000 Units by mouth every evening. , Disp: , Rfl:  .  Cinnamon 500 MG capsule, Take 2,000 mg by mouth daily.  , Disp: , Rfl:  .  citalopram (CELEXA) 10 MG tablet, TAKE 1 TABLET BY MOUTH EVERY DAY, Disp: 90 tablet, Rfl: 1 .  Coenzyme Q10 200 MG capsule, Take 200 mg by mouth at bedtime. , Disp: , Rfl:  .  fexofenadine (ALLEGRA) 180 MG tablet, Take 180 mg by mouth daily., Disp: , Rfl:  .  gabapentin (NEURONTIN) 300 MG capsule, TAKE 1 CAPSULE (300 MG TOTAL) BY MOUTH AT BEDTIME., Disp: 90 capsule, Rfl: 2 .  Glucosamine HCl (GLUCOSAMINE PO), Take 1 tablet by mouth every evening. , Disp: , Rfl:  .  hydrochlorothiazide (MICROZIDE) 12.5 MG capsule, Take 1 capsule (12.5 mg total) by mouth daily., Disp: 90 capsule, Rfl: 3 .  ketoconazole (NIZORAL) 2 % shampoo, Apply 1 application topically 2 (two) times a week. (Patient taking differently: Apply 1 application topically every 14 (fourteen) days. ONCE EVERY 2 WEEKS PER PATIENT), Disp: 120 mL, Rfl: 03 .  levothyroxine (SYNTHROID, LEVOTHROID) 112 MCG tablet, TAKE 1 TABLET (112 MCG TOTAL) BY MOUTH DAILY BEFORE BREAKFAST., Disp: 90 tablet, Rfl: 1 .  nystatin (MYCOSTATIN) 100000 UNIT/ML suspension, Take 5 mLs (500,000 Units total) by mouth 4 (four) times daily., Disp: 60 mL, Rfl: 0 .  Omega-3 Fatty Acids (FISH OIL) 1200 MG CAPS, Take 1,200 mg by mouth daily. , Disp: , Rfl:  .  rosuvastatin (CRESTOR) 20 MG tablet, TAKE 1 TABLET (20 MG TOTAL) BY MOUTH DAILY.,  Disp: 90 tablet, Rfl: 2 .  Selexipag (UPTRAVI) 200 MCG TABS, Take 200 mcg by mouth 2 (two) times daily., Disp: , Rfl:  .  Spacer/Aero-Holding Chambers (AEROCHAMBER PLUS WITH MASK) inhaler, Use as instructed, Disp: 1 each, Rfl: 2 .  tadalafil, PAH, (ADCIRCA) 20 MG tablet, Take 2 tablets (40 mg total) by mouth daily., Disp: 60 tablet, Rfl: 11 .  traMADol (ULTRAM) 50 MG tablet, Take 2 tablets (100 mg total) by mouth every 6 (six) hours as needed for moderate pain or severe pain., Disp: 120 tablet, Rfl: 0 .  vitamin B-12 (CYANOCOBALAMIN) 1000 MCG tablet, Take 1,000 mcg by mouth daily., Disp: , Rfl:

## 2018-04-13 ENCOUNTER — Ambulatory Visit (HOSPITAL_COMMUNITY)
Admission: RE | Admit: 2018-04-13 | Discharge: 2018-04-13 | Disposition: A | Discharge status: Home / Self Care | Payer: Medicare Other | Source: Ambulatory Visit | Attending: Cardiology | Admitting: Cardiology

## 2018-04-13 ENCOUNTER — Encounter (INDEPENDENT_AMBULATORY_CARE_PROVIDER_SITE_OTHER): Payer: Medicare Other | Admitting: Ophthalmology

## 2018-04-13 VITALS — BP 126/72 | HR 68 | Wt 171.4 lb

## 2018-04-13 DIAGNOSIS — Z7982 Long term (current) use of aspirin: Secondary | ICD-10-CM | POA: Insufficient documentation

## 2018-04-13 DIAGNOSIS — J439 Emphysema, unspecified: Secondary | ICD-10-CM | POA: Insufficient documentation

## 2018-04-13 DIAGNOSIS — H34832 Tributary (branch) retinal vein occlusion, left eye, with macular edema: Secondary | ICD-10-CM | POA: Diagnosis not present

## 2018-04-13 DIAGNOSIS — E785 Hyperlipidemia, unspecified: Secondary | ICD-10-CM | POA: Diagnosis not present

## 2018-04-13 DIAGNOSIS — J9611 Chronic respiratory failure with hypoxia: Secondary | ICD-10-CM | POA: Diagnosis not present

## 2018-04-13 DIAGNOSIS — I10 Essential (primary) hypertension: Secondary | ICD-10-CM | POA: Insufficient documentation

## 2018-04-13 DIAGNOSIS — Z87891 Personal history of nicotine dependence: Secondary | ICD-10-CM | POA: Insufficient documentation

## 2018-04-13 DIAGNOSIS — I2510 Atherosclerotic heart disease of native coronary artery without angina pectoris: Secondary | ICD-10-CM | POA: Insufficient documentation

## 2018-04-13 DIAGNOSIS — D869 Sarcoidosis, unspecified: Secondary | ICD-10-CM | POA: Diagnosis not present

## 2018-04-13 DIAGNOSIS — H43813 Vitreous degeneration, bilateral: Secondary | ICD-10-CM

## 2018-04-13 DIAGNOSIS — Z79899 Other long term (current) drug therapy: Secondary | ICD-10-CM | POA: Insufficient documentation

## 2018-04-13 DIAGNOSIS — I272 Pulmonary hypertension, unspecified: Secondary | ICD-10-CM | POA: Insufficient documentation

## 2018-04-13 DIAGNOSIS — E039 Hypothyroidism, unspecified: Secondary | ICD-10-CM | POA: Diagnosis not present

## 2018-04-13 DIAGNOSIS — H35033 Hypertensive retinopathy, bilateral: Secondary | ICD-10-CM | POA: Diagnosis not present

## 2018-04-13 DIAGNOSIS — Z9981 Dependence on supplemental oxygen: Secondary | ICD-10-CM | POA: Insufficient documentation

## 2018-04-13 NOTE — Progress Notes (Signed)
Patient attempted 6 min walk.  Was able to ambulate 120 ft in 1 min 32 seconds before having to stop.  Patient's oxygen before starting was 92% on 4L.  Oxygen dropped to 67% on 5L with HR 90.    Patient has now recovered with rest with Oxygen 94% on 4L.

## 2018-04-13 NOTE — Progress Notes (Signed)
PCP: Dr. Charlett Blake Pulmonary: Dr. Lake Bells Cardiology: Dr. Aundra Dubin  72 y.o. with history of sarcoidosis and emphysema was referred by Dr Lake Bells for evaluation of pulmonary hypertension.  She has a diagnosis of sarcoidosis, but CT chest in 3/19 did not show marked parenchymal disease.  PFTs showed very low DLCO.  She had RHC done in 4/19 with moderate pulmonary hypertension and very high PVR.    She is now wearing 2 L [place] at all times.  She does not smoke, says she quit > 20 years ago.    Echo in 6/19 showed EF 55-60%, mildly dilated RV with moderately decreased systolic function, PASP 83 mmHg, aortic sclerosis without significant stenosis.   She returns for f/u of pulmonary HTN. Last visit, opsumit was stopped due to flu like symptoms and adcirca was increased to 40 mg daily. HCTZ was also restarted. She is now also on Uptravi. Overall doing okay. Flu-like symptoms are still present. She has chills and runny nose. She plans to try flonase. She also has some diarrhea that started yesterday. Increased Uptravi to 600 mcg BID today. SOB is the same. She is still SOB walking around the house. No orthopnea/PND. She has dizziness when she is very SOB. No CP. She checks her pulse ox while exercising and says O2 sats as low as 50s, improves with rest. Wears 4 L O2 at all times. Taking all medications. Weight unchanged from last clinic weight.   6 minute walk (5/19): 122 m 6 minute walk (9/19):  67 m (only walked for about 1 minute).   Labs (3/19): LDL 53 Labs (4/19): hgb 8.5, K 4.8, creatinine 1.06 Labs (5/19): anti-SCL70 negative, ANA negative, RF negative, ANCA negative, HIV negative, anti-centromere negative.  Labs (6/19): K 4, creatinine 1.03 Labs (9/19): K 4.6, creatinine 1.18  PMH: 1. Sarcoidosis: On home oxygen.  - PFTs (3/19): FEV1 84%, FVC 84%, ratio 71%, TLC 75%, DLCO 25% - CT (3/19) with mild fibrotic changes in the lung bases.  2. Emphysema: Moderate emphysema on CT chest 3/19.  3. CAD:  Calcification of coronaries on CT chest in 3/19.  - Cardiolite in 1/19: EF 65%, no ischemia/infarction.  4. HTN 5. Hyperlipidemia 6. Hypothyroidism 7. Anemia 8. Pulmonary hypertension:  - Echo (9/17): EF 60-65%, PASP 44 mmHg.  - RHC (4/19): mean RA 2, PA 66/16 mean 34, mean PCWP 3, PVR 8.3 WU, CI 2.04 - Autoimmune serologies negative.  - V/Q scan did not show evidence for chronic PE.  - Echo (6/19): EF 55-60%, mildly dilated RV with moderately decreased systolic function, PASP 83 mmHg, aortic sclerosis without significant stenosis.   Review of systems complete and found to be negative unless listed in HPI.   Social History   Socioeconomic History  . Marital status: Widowed    Spouse name: Not on file  . Number of children: 2  . Years of education: Not on file  . Highest education level: Not on file  Occupational History  . Not on file  Social Needs  . Financial resource strain: Not on file  . Food insecurity:    Worry: Not on file    Inability: Not on file  . Transportation needs:    Medical: Not on file    Non-medical: Not on file  Tobacco Use  . Smoking status: Former Smoker    Packs/day: 0.50    Years: 32.00    Pack years: 16.00    Types: Cigarettes    Last attempt to quit: 07/22/1994  Years since quitting: 23.7  . Smokeless tobacco: Never Used  Substance and Sexual Activity  . Alcohol use: No    Alcohol/week: 0.0 standard drinks  . Drug use: No  . Sexual activity: Not Currently  Lifestyle  . Physical activity:    Days per week: Not on file    Minutes per session: Not on file  . Stress: Not on file  Relationships  . Social connections:    Talks on phone: Not on file    Gets together: Not on file    Attends religious service: Not on file    Active member of club or organization: Not on file    Attends meetings of clubs or organizations: Not on file    Relationship status: Not on file  . Intimate partner violence:    Fear of current or ex partner: Not  on file    Emotionally abused: Not on file    Physically abused: Not on file    Forced sexual activity: Not on file  Other Topics Concern  . Not on file  Social History Narrative   Married 24 years and has 2 children (2 daughters)   Alcohol Use - no   Former Smoker - she quit 10 years ago, started when she was 84 and has had varying level of use of tobacco products from 1/2 pack to 3 packs per day.   Family History  Problem Relation Age of Onset  . Cancer Mother 63       Breast  . Heart disease Mother   . Hypertension Mother   . Hyperlipidemia Mother   . Heart attack Mother   . Asthma Maternal Grandmother     Current Outpatient Medications  Medication Sig Dispense Refill  . acetaminophen (TYLENOL) 500 MG tablet Take 1,000 mg by mouth every 6 (six) hours as needed for moderate pain.    Marland Kitchen albuterol (PROVENTIL HFA;VENTOLIN HFA) 108 (90 Base) MCG/ACT inhaler Inhale 2 puffs into the lungs every 6 (six) hours as needed for wheezing or shortness of breath. 1 Inhaler 6  . albuterol (PROVENTIL) (2.5 MG/3ML) 0.083% nebulizer solution Take 3 mLs (2.5 mg total) by nebulization every 6 (six) hours as needed for wheezing or shortness of breath. 30 mL 4  . aspirin EC 81 MG tablet Take 81 mg by mouth at bedtime.     Marland Kitchen atenolol (TENORMIN) 25 MG tablet Take 25 mg by mouth at bedtime.    Marland Kitchen atenolol (TENORMIN) 50 MG tablet TAKE 1 TABLET (50 MG TOTAL) BY MOUTH DAILY. (Patient taking differently: Take 50 mg by mouth every morning. ) 90 tablet 1  . BESIVANCE 0.6 % SUSP Place 1 drop into the left eye See admin instructions. INSTILL 1 DROP INTO LEFT 4 TIMES DAILY x 2 DAYS AFTER EYE INJECTION  12  . budesonide-formoterol (SYMBICORT) 160-4.5 MCG/ACT inhaler Inhale 2 puffs into the lungs 2 (two) times daily. 1 Inhaler 0  . calcium carbonate (OSCAL) 1500 (600 Ca) MG TABS tablet Take 1,200 mg by mouth daily with breakfast.    . Cholecalciferol (VITAMIN D) 2000 UNITS tablet Take 2,000 Units by mouth every evening.      . Cinnamon 500 MG capsule Take 2,000 mg by mouth daily.      . citalopram (CELEXA) 10 MG tablet TAKE 1 TABLET BY MOUTH EVERY DAY 90 tablet 1  . Coenzyme Q10 200 MG capsule Take 200 mg by mouth at bedtime.     . fexofenadine (ALLEGRA) 180 MG tablet Take 180  mg by mouth daily.    Marland Kitchen gabapentin (NEURONTIN) 300 MG capsule TAKE 1 CAPSULE (300 MG TOTAL) BY MOUTH AT BEDTIME. 90 capsule 2  . Glucosamine HCl (GLUCOSAMINE PO) Take 1 tablet by mouth every evening.     . hydrochlorothiazide (MICROZIDE) 12.5 MG capsule Take 1 capsule (12.5 mg total) by mouth daily. 90 capsule 3  . ketoconazole (NIZORAL) 2 % shampoo Apply 1 application topically 2 (two) times a week. (Patient taking differently: Apply 1 application topically every 14 (fourteen) days. ONCE EVERY 2 WEEKS PER PATIENT) 120 mL 03  . levothyroxine (SYNTHROID, LEVOTHROID) 112 MCG tablet TAKE 1 TABLET (112 MCG TOTAL) BY MOUTH DAILY BEFORE BREAKFAST. 90 tablet 1  . nystatin (MYCOSTATIN) 100000 UNIT/ML suspension Take 5 mLs (500,000 Units total) by mouth 4 (four) times daily. 60 mL 0  . Omega-3 Fatty Acids (FISH OIL) 1200 MG CAPS Take 1,200 mg by mouth daily.     . rosuvastatin (CRESTOR) 20 MG tablet TAKE 1 TABLET (20 MG TOTAL) BY MOUTH DAILY. 90 tablet 2  . Selexipag (UPTRAVI) 200 MCG TABS Take 200 mcg by mouth 2 (two) times daily.    Marland Kitchen Spacer/Aero-Holding Chambers (AEROCHAMBER PLUS WITH MASK) inhaler Use as instructed 1 each 2  . tadalafil, PAH, (ADCIRCA) 20 MG tablet Take 2 tablets (40 mg total) by mouth daily. 60 tablet 11  . traMADol (ULTRAM) 50 MG tablet Take 2 tablets (100 mg total) by mouth every 6 (six) hours as needed for moderate pain or severe pain. 120 tablet 0  . vitamin B-12 (CYANOCOBALAMIN) 1000 MCG tablet Take 1,000 mcg by mouth daily.     No current facility-administered medications for this encounter.    BP 126/72   Pulse 68   Wt 77.7 kg (171 lb 6.4 oz)   SpO2 90%   BMI 30.36 kg/m   Wt Readings from Last 3 Encounters:   04/13/18 77.7 kg (171 lb 6.4 oz)  03/09/18 76.7 kg (169 lb)  03/09/18 76.8 kg (169 lb 6.4 oz)   General: No resp difficulty. Sitting in wheelchair. + tremors HEENT: Normal Neck: Supple. JVP flat. Carotids 2+ bilat; no bruits. No thyromegaly or nodule noted. Cor: PMI nondisplaced. RRR, No M/G/R noted Lungs: diminished throughout. On 4 L [place] Abdomen: Soft, non-tender, non-distended, no HSM. No bruits or masses. +BS  Extremities: No cyanosis, clubbing, or rash. R and LLE no edema.  Neuro: Alert & orientedx3, cranial nerves grossly intact. moves all 4 extremities w/o difficulty. Affect pleasant  Assessment/Plan: 1. Pulmonary hypertension: Cath 4/19 with moderate PAH, very high PVR. Right and left heart filling pressures were low.  PFTs in 3/19 showed mild restriction with very low DLCO consistent with pulmonary vascular disease.  V/Q scan with no evidence for chronic PE and autoimmune serologies were negative. High resolution CT in 3/19 showed mild fibrotic changes at the lung bases and a degree of emphysema.  I do not think CT findings can explain her PH.  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  Echo 6/19 showed normal LV EF but mildly dilated/moderately dysfunctional RV with severe pulmonary hypertension. She had flu-like symptoms for the 30 days she was on Opsumit, so this was stopped. Continues to have runny nose and chills. Diarrhea started yesterday. She was only able to walk 1 minute of 6 minute walk today.  - She still needs a sleep study with the pulmonary department. 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  As parenchymal disease does not appear particularly bad on  CT, we have been trying her on pulmonary vasodilators.  - Continue Adcirca 40 mg daily + Uptravi 600 mcg BID, continue to titrate up Uptravi.  2. Chronic hypoxemic respiratory failure: She is on home oxygen.  As her parenchymal disease does not appear marked, I suspect pulmonary hypertension is playing a role in her  low oxygen saturation.   - Continue home oxygen, watch for desaturation with initiation of pulmonary vasodilators.  - Follows with Dr Lake Bells 3. HTN:  - Continue HCTZ 12.5 mg daily. Repeat labs stable with creatinine 1.18, K 4.6 4. Hyperlipidemia: Good lipids 3/19.   Georgiana Shore, NP 04/13/2018   Patient seen with NP, agree with the above note.  Mrs Mehlhoff continues to have dyspnea with minimal exertion. So far, she has not had much improvement with pulmonary vasodilators.  She did not tolerate Opsumit. She is on Adcirca 40 mg daily and is still titrating up Uptravi.   - Continue Uptravi titration to maximally tolerated dose.   - followup in 2 months.   [name] 04/15/2018

## 2018-04-13 NOTE — Patient Instructions (Addendum)
Follow up as scheduled.

## 2018-04-14 ENCOUNTER — Telehealth: Payer: Self-pay | Admitting: Pulmonary Disease

## 2018-04-14 DIAGNOSIS — I10 Essential (primary) hypertension: Secondary | ICD-10-CM | POA: Diagnosis not present

## 2018-04-14 DIAGNOSIS — J9611 Chronic respiratory failure with hypoxia: Secondary | ICD-10-CM | POA: Diagnosis not present

## 2018-04-14 DIAGNOSIS — D860 Sarcoidosis of lung: Secondary | ICD-10-CM | POA: Diagnosis not present

## 2018-04-14 DIAGNOSIS — J449 Chronic obstructive pulmonary disease, unspecified: Secondary | ICD-10-CM | POA: Diagnosis not present

## 2018-04-14 DIAGNOSIS — I272 Pulmonary hypertension, unspecified: Secondary | ICD-10-CM | POA: Diagnosis not present

## 2018-04-14 DIAGNOSIS — G894 Chronic pain syndrome: Secondary | ICD-10-CM | POA: Diagnosis not present

## 2018-04-14 NOTE — Telephone Encounter (Signed)
Called and spoke with patient, she stated that she received a call from [facility] in regards to an appointment. I do not see documentation on this. cherina please advise on call from patient, thank you.

## 2018-04-14 NOTE — Telephone Encounter (Signed)
Spoke with patient. She was calling back to get scheduled to see BQ in 4 weeks. She has been scheduled for 05/17/18 at 245. She verbalized understanding. Nothing further needed at time of call.

## 2018-04-20 DIAGNOSIS — I10 Essential (primary) hypertension: Secondary | ICD-10-CM | POA: Diagnosis not present

## 2018-04-20 DIAGNOSIS — I272 Pulmonary hypertension, unspecified: Secondary | ICD-10-CM | POA: Diagnosis not present

## 2018-04-20 DIAGNOSIS — J9611 Chronic respiratory failure with hypoxia: Secondary | ICD-10-CM | POA: Diagnosis not present

## 2018-04-20 DIAGNOSIS — D860 Sarcoidosis of lung: Secondary | ICD-10-CM | POA: Diagnosis not present

## 2018-04-20 DIAGNOSIS — G894 Chronic pain syndrome: Secondary | ICD-10-CM | POA: Diagnosis not present

## 2018-04-20 DIAGNOSIS — J449 Chronic obstructive pulmonary disease, unspecified: Secondary | ICD-10-CM | POA: Diagnosis not present

## 2018-04-27 ENCOUNTER — Telehealth: Payer: Self-pay | Admitting: *Deleted

## 2018-04-27 DIAGNOSIS — J449 Chronic obstructive pulmonary disease, unspecified: Secondary | ICD-10-CM | POA: Diagnosis not present

## 2018-04-27 DIAGNOSIS — J9611 Chronic respiratory failure with hypoxia: Secondary | ICD-10-CM | POA: Diagnosis not present

## 2018-04-27 DIAGNOSIS — I10 Essential (primary) hypertension: Secondary | ICD-10-CM | POA: Diagnosis not present

## 2018-04-27 DIAGNOSIS — D860 Sarcoidosis of lung: Secondary | ICD-10-CM | POA: Diagnosis not present

## 2018-04-27 DIAGNOSIS — I272 Pulmonary hypertension, unspecified: Secondary | ICD-10-CM | POA: Diagnosis not present

## 2018-04-27 DIAGNOSIS — G894 Chronic pain syndrome: Secondary | ICD-10-CM | POA: Diagnosis not present

## 2018-04-27 NOTE — Telephone Encounter (Signed)
Received Home Health Certification and Plan of Care; forwarded to provider/SLS 09/24

## 2018-05-07 ENCOUNTER — Other Ambulatory Visit (HOSPITAL_COMMUNITY): Payer: Self-pay

## 2018-05-07 MED ORDER — SELEXIPAG 200 MCG PO TABS: 200.0000 ug | ORAL_TABLET | Freq: Two times a day (BID) | ORAL | 5 refills | Status: DC

## 2018-05-10 ENCOUNTER — Ambulatory Visit (INDEPENDENT_AMBULATORY_CARE_PROVIDER_SITE_OTHER): Payer: Medicare Other | Admitting: Family Medicine

## 2018-05-10 VITALS — BP 118/58 | HR 75 | Temp 98.1°F | Resp 18 | Wt 168.6 lb

## 2018-05-10 DIAGNOSIS — E782 Mixed hyperlipidemia: Secondary | ICD-10-CM

## 2018-05-10 DIAGNOSIS — R739 Hyperglycemia, unspecified: Secondary | ICD-10-CM

## 2018-05-10 DIAGNOSIS — I10 Essential (primary) hypertension: Secondary | ICD-10-CM | POA: Diagnosis not present

## 2018-05-10 DIAGNOSIS — I272 Pulmonary hypertension, unspecified: Secondary | ICD-10-CM

## 2018-05-10 MED ORDER — KETOCONAZOLE 2 % EX CREA: 1.0000 "application " | TOPICAL_CREAM | Freq: Every day | CUTANEOUS | 0 refills | Status: DC

## 2018-05-10 NOTE — Patient Instructions (Signed)

## 2018-05-11 LAB — CBC
HCT: 44.1 % (ref 36.0–46.0)
HEMOGLOBIN: 15 g/dL (ref 12.0–15.0)
MCHC: 34 g/dL (ref 30.0–36.0)
MCV: 88.6 fl (ref 78.0–100.0)
PLATELETS: 191 10*3/uL (ref 150.0–400.0)
RBC: 4.97 Mil/uL (ref 3.87–5.11)
RDW: 13.8 % (ref 11.5–15.5)
WBC: 10.2 10*3/uL (ref 4.0–10.5)

## 2018-05-11 LAB — COMPREHENSIVE METABOLIC PANEL
ALK PHOS: 48 U/L (ref 39–117)
ALT: 9 U/L (ref 0–35)
AST: 13 U/L (ref 0–37)
Albumin: 4.3 g/dL (ref 3.5–5.2)
BILIRUBIN TOTAL: 0.5 mg/dL (ref 0.2–1.2)
BUN: 18 mg/dL (ref 6–23)
CALCIUM: 9.9 mg/dL (ref 8.4–10.5)
CO2: 30 mEq/L (ref 19–32)
CREATININE: 1 mg/dL (ref 0.40–1.20)
Chloride: 102 mEq/L (ref 96–112)
GFR: 57.59 mL/min — ABNORMAL LOW (ref >60.00)
GLUCOSE: 107 mg/dL — AB (ref 70–99)
Potassium: 4.1 mEq/L (ref 3.5–5.1)
Sodium: 141 mEq/L (ref 135–145)
TOTAL PROTEIN: 6.9 g/dL (ref 6.0–8.3)

## 2018-05-11 LAB — TSH: TSH: 2.12 u[IU]/mL (ref 0.35–4.50)

## 2018-05-16 NOTE — Assessment & Plan Note (Signed)
Well controlled, no changes to meds. Encouraged heart healthy diet such as the DASH diet and exercise as tolerated.

## 2018-05-16 NOTE — Assessment & Plan Note (Signed)
Following with pulmonology and they have her on new meds and she is stable.

## 2018-05-16 NOTE — Progress Notes (Signed)
Subjective:    Patient ID: Kimberly Irwin, female    DOB: [birth year], 72 y.o.   MRN: [id number]  No chief complaint on file.   HPI Patient is in today for follow up. She is accompanied by her daughter. She continues to be debilitated by he COPD/pulmonary hypertension. She is following with pulmonology and is toleating her new med. No recent febrile illness or hospitalizations. Denies CP/palp/HA/congestion/fevers/GI or GU c/o. Taking meds as prescribed  Past Medical History:  Diagnosis Date  . Bursitis of left hip 10/26/2012  . Chronic low back pain   . Chronic respiratory failure with hypoxia ([place]) 10/21/2016  . Coronary artery calcification 07/03/2016  . DDD (degenerative disc disease)    L3-4 with facet arthropathy and stenosis  . Degenerative spondylolisthesis    L4-5 grade 1 with stenosis  . Emphysema lung ([place])   . GERD (gastroesophageal reflux disease)   . Hiatal hernia   . Hyperlipidemia   . Hypertension   . Hypothyroidism 10/28/2015  . Pain in joint, lower leg 10/26/2012   left   . Pneumonia    as a child several times.   . Pulmonary hypertension ([place])   . Sarcoidosis   . Tremors of nervous system     Past Surgical History:  Procedure Laterality Date  . ANTERIOR LAT LUMBAR FUSION Left 08/29/2014   Procedure: EXTREME LEFT LATERAL INTERBODY FUSION LUMBAR TWO-THREE LATERAL PLATE;  Surgeon: Charlie Pitter, MD;  Location: [facility];  Service: Neurosurgery;  Laterality: Left;  . BACK SURGERY  01/15/09   L3-4 and L4-5 decompressive laminectomy with bilateral L3, L4, and L5 decompressive foraminotomies, more than it would be required for simple interbody fusion alone.  Marland Kitchen BACK SURGERY  01/15/09   L3-4 and L4-5 posterior lumbar interbody fusion utilizing tanget interbody allograft wedge, Telamon interbody PEEk cage, and local autografting.  Marland Kitchen BACK SURGERY  01/15/09   L3, L4, and L5 posterolateral arthrodesis using segmental pedicle screw fixation and local autografting.  Marland Kitchen  CARDIAC CATHETERIZATION    . CARPAL TUNNEL RELEASE Bilateral   . COLONOSCOPY    . ESOPHAGOGASTRODUODENOSCOPY    . EYE SURGERY     lens implant  . JOINT REPLACEMENT  09/13/09   Right Hip, Dr. Alvan Dame  . KYPHOPLASTY N/A 09/14/2014   Procedure: Lumbar Two Kyphoplasty/Vertebroplasty;  Surgeon: Charlie Pitter, MD;  Location: [facility];  Service: Neurosurgery;  Laterality: N/A;  Lumbar Two Kyphoplasty/Vertebroplasty  . RIGHT HEART CATH N/A 11/19/2017   Procedure: RIGHT HEART CATH;  Surgeon: Larey Dresser, MD;  Location: [facility];  Service: Cardiovascular;  Laterality: N/A;  . THYROIDECTOMY    . TOTAL HIP ARTHROPLASTY Left 06/28/2013   Procedure: LEFT TOTAL  HIP ARTHROPLASTY ANTERIOR APPROACH;  Surgeon: Mauri Pole, MD;  Location: [facility];  Service: Orthopedics;  Laterality: Left;    Family History  Problem Relation Age of Onset  . Cancer Mother 20       Breast  . Heart disease Mother   . Hypertension Mother   . Hyperlipidemia Mother   . Heart attack Mother   . Asthma Maternal Grandmother     Social History   Socioeconomic History  . Marital status: Widowed    Spouse name: Not on file  . Number of children: 2  . Years of education: Not on file  . Highest education level: Not on file  Occupational History  . Not on file  Social Needs  . Financial resource strain: Not  on file  . Food insecurity:    Worry: Not on file    Inability: Not on file  . Transportation needs:    Medical: Not on file    Non-medical: Not on file  Tobacco Use  . Smoking status: Former Smoker    Packs/day: 0.50    Years: 32.00    Pack years: 16.00    Types: Cigarettes    Last attempt to quit: 07/22/1994    Years since quitting: 23.8  . Smokeless tobacco: Never Used  Substance and Sexual Activity  . Alcohol use: No    Alcohol/week: 0.0 standard drinks  . Drug use: No  . Sexual activity: Not Currently  Lifestyle  . Physical activity:    Days per week: Not on file    Minutes per  session: Not on file  . Stress: Not on file  Relationships  . Social connections:    Talks on phone: Not on file    Gets together: Not on file    Attends religious service: Not on file    Active member of club or organization: Not on file    Attends meetings of clubs or organizations: Not on file    Relationship status: Not on file  . Intimate partner violence:    Fear of current or ex partner: Not on file    Emotionally abused: Not on file    Physically abused: Not on file    Forced sexual activity: Not on file  Other Topics Concern  . Not on file  Social History Narrative   Married 69 years and has 2 children (2 daughters)   Alcohol Use - no   Former Smoker - she quit 10 years ago, started when she was 21 and has had varying level of use of tobacco products from 1/2 pack to 3 packs per day.    Outpatient Medications Prior to Visit  Medication Sig Dispense Refill  . acetaminophen (TYLENOL) 500 MG tablet Take 1,000 mg by mouth every 6 (six) hours as needed for moderate pain.    Marland Kitchen albuterol (PROVENTIL HFA;VENTOLIN HFA) 108 (90 Base) MCG/ACT inhaler Inhale 2 puffs into the lungs every 6 (six) hours as needed for wheezing or shortness of breath. 1 Inhaler 6  . albuterol (PROVENTIL) (2.5 MG/3ML) 0.083% nebulizer solution Take 3 mLs (2.5 mg total) by nebulization every 6 (six) hours as needed for wheezing or shortness of breath. 30 mL 4  . aspirin EC 81 MG tablet Take 81 mg by mouth at bedtime.     Marland Kitchen atenolol (TENORMIN) 25 MG tablet Take 25 mg by mouth at bedtime.    Marland Kitchen atenolol (TENORMIN) 50 MG tablet TAKE 1 TABLET (50 MG TOTAL) BY MOUTH DAILY. (Patient taking differently: Take 50 mg by mouth every morning. ) 90 tablet 1  . BESIVANCE 0.6 % SUSP Place 1 drop into the left eye See admin instructions. INSTILL 1 DROP INTO LEFT 4 TIMES DAILY x 2 DAYS AFTER EYE INJECTION  12  . budesonide-formoterol (SYMBICORT) 160-4.5 MCG/ACT inhaler Inhale 2 puffs into the lungs 2 (two) times daily. 1 Inhaler  0  . calcium carbonate (OSCAL) 1500 (600 Ca) MG TABS tablet Take 1,200 mg by mouth daily with breakfast.    . Cholecalciferol (VITAMIN D) 2000 UNITS tablet Take 2,000 Units by mouth every evening.     . Cinnamon 500 MG capsule Take 2,000 mg by mouth daily.      . citalopram (CELEXA) 10 MG tablet TAKE 1 TABLET BY MOUTH  EVERY DAY 90 tablet 1  . Coenzyme Q10 200 MG capsule Take 200 mg by mouth at bedtime.     . fexofenadine (ALLEGRA) 180 MG tablet Take 180 mg by mouth daily.    Marland Kitchen gabapentin (NEURONTIN) 300 MG capsule TAKE 1 CAPSULE (300 MG TOTAL) BY MOUTH AT BEDTIME. 90 capsule 2  . Glucosamine HCl (GLUCOSAMINE PO) Take 1 tablet by mouth every evening.     . hydrochlorothiazide (MICROZIDE) 12.5 MG capsule Take 1 capsule (12.5 mg total) by mouth daily. 90 capsule 3  . ketoconazole (NIZORAL) 2 % shampoo Apply 1 application topically 2 (two) times a week. (Patient taking differently: Apply 1 application topically every 14 (fourteen) days. ONCE EVERY 2 WEEKS PER PATIENT) 120 mL 03  . levothyroxine (SYNTHROID, LEVOTHROID) 112 MCG tablet TAKE 1 TABLET (112 MCG TOTAL) BY MOUTH DAILY BEFORE BREAKFAST. 90 tablet 1  . nystatin (MYCOSTATIN) 100000 UNIT/ML suspension Take 5 mLs (500,000 Units total) by mouth 4 (four) times daily. 60 mL 0  . Omega-3 Fatty Acids (FISH OIL) 1200 MG CAPS Take 1,200 mg by mouth daily.     . rosuvastatin (CRESTOR) 20 MG tablet TAKE 1 TABLET (20 MG TOTAL) BY MOUTH DAILY. 90 tablet 2  . Selexipag (UPTRAVI) 200 MCG TABS Take 1 tablet (200 mcg total) by mouth 2 (two) times daily. 60 tablet 5  . Spacer/Aero-Holding Chambers (AEROCHAMBER PLUS WITH MASK) inhaler Use as instructed 1 each 2  . tadalafil, PAH, (ADCIRCA) 20 MG tablet Take 2 tablets (40 mg total) by mouth daily. 60 tablet 11  . traMADol (ULTRAM) 50 MG tablet Take 2 tablets (100 mg total) by mouth every 6 (six) hours as needed for moderate pain or severe pain. 120 tablet 0  . vitamin B-12 (CYANOCOBALAMIN) 1000 MCG tablet Take  1,000 mcg by mouth daily.     No facility-administered medications prior to visit.     Allergies  Allergen Reactions  . Lipitor [Atorvastatin] Swelling  . Penicillins Swelling    Has patient had a PCN reaction causing immediate rash, facial/tongue/throat swelling, SOB or lightheadedness with hypotension: Yes Has patient had a PCN reaction causing severe rash involving mucus membranes or skin necrosis: No Has patient had a PCN reaction that required hospitalization: No Has patient had a PCN reaction occurring within the last 10 years: No If all of the above answers are "NO", then may proceed with Cephalosporin use.   . Metronidazole Swelling  . Pregabalin Other (See Comments)    REACTION: Somnolence and dizziness  . Simvastatin Other (See Comments)    Leg pain    Review of Systems  Constitutional: Positive for malaise/fatigue. Negative for fever.  HENT: Negative for congestion.   Eyes: Negative for blurred vision.  Respiratory: Positive for shortness of breath.   Cardiovascular: Negative for chest pain, palpitations and leg swelling.  Gastrointestinal: Negative for abdominal pain, blood in stool and nausea.  Genitourinary: Negative for dysuria and frequency.  Musculoskeletal: Negative for falls.  Skin: Negative.  Negative for rash.  Neurological: Negative for dizziness, loss of consciousness and headaches.  Endo/Heme/Allergies: Negative for environmental allergies.  Psychiatric/Behavioral: Negative for depression. The patient is not nervous/anxious.        Objective:    Physical Exam  Constitutional: She is oriented to person, place, and time. She appears well-developed and well-nourished. No distress.  HENT:  Head: Normocephalic and atraumatic.  Nose: Nose normal.  Eyes: Right eye exhibits no discharge. Left eye exhibits no discharge.  Neck: Normal range of motion.  Neck supple.  Cardiovascular: Normal rate and regular rhythm.  No murmur heard. Pulmonary/Chest: Effort  normal and breath sounds normal.  Prolonged expiration  Abdominal: Soft. Bowel sounds are normal. There is no tenderness.  Musculoskeletal: She exhibits no edema.  Neurological: She is alert and oriented to person, place, and time.  Skin: Skin is warm and dry.  Psychiatric: She has a normal mood and affect.  Nursing note and vitals reviewed.   BP (!) 118/58 (BP Location: Left Arm, Patient Position: Sitting, Cuff Size: Normal)   Pulse 75   Temp 98.1 F (36.7 C) (Oral)   Resp 18   Wt 168 lb 9.6 oz (76.5 kg)   SpO2 90%   BMI 29.87 kg/m  Wt Readings from Last 3 Encounters:  05/10/18 168 lb 9.6 oz (76.5 kg)  04/13/18 171 lb 6.4 oz (77.7 kg)  03/09/18 169 lb (76.7 kg)     Lab Results  Component Value Date   WBC 10.2 05/10/2018   HGB 15.0 05/10/2018   HCT 44.1 05/10/2018   PLT 191.0 05/10/2018   GLUCOSE 107 (H) 05/10/2018   CHOL 135 10/19/2017   TRIG 200.0 (H) 10/19/2017   HDL 42.30 10/19/2017   LDLDIRECT 84.0 03/03/2017   LDLCALC 53 10/19/2017   ALT 9 05/10/2018   AST 13 05/10/2018   NA 141 05/10/2018   K 4.1 05/10/2018   CL 102 05/10/2018   CREATININE 1.00 05/10/2018   BUN 18 05/10/2018   CO2 30 05/10/2018   TSH 2.12 05/10/2018   INR 1.11 11/19/2017   HGBA1C 5.5 06/19/2017   MICROALBUR 0.2 04/27/2014    Lab Results  Component Value Date   TSH 2.12 05/10/2018   Lab Results  Component Value Date   WBC 10.2 05/10/2018   HGB 15.0 05/10/2018   HCT 44.1 05/10/2018   MCV 88.6 05/10/2018   PLT 191.0 05/10/2018   Lab Results  Component Value Date   NA 141 05/10/2018   K 4.1 05/10/2018   CO2 30 05/10/2018   GLUCOSE 107 (H) 05/10/2018   BUN 18 05/10/2018   CREATININE 1.00 05/10/2018   BILITOT 0.5 05/10/2018   ALKPHOS 48 05/10/2018   AST 13 05/10/2018   ALT 9 05/10/2018   PROT 6.9 05/10/2018   ALBUMIN 4.3 05/10/2018   CALCIUM 9.9 05/10/2018   ANIONGAP 9 01/27/2018   GFR 57.59 (L) 05/10/2018   Lab Results  Component Value Date   CHOL 135 10/19/2017    Lab Results  Component Value Date   HDL 42.30 10/19/2017   Lab Results  Component Value Date   LDLCALC 53 10/19/2017   Lab Results  Component Value Date   TRIG 200.0 (H) 10/19/2017   Lab Results  Component Value Date   CHOLHDL 3 10/19/2017   Lab Results  Component Value Date   HGBA1C 5.5 06/19/2017       Assessment & Plan:   Problem List Items Addressed This Visit    Hyperlipidemia (Chronic)    Tolerating statin, encouraged heart healthy diet, avoid trans fats, minimize simple carbs and saturated fats. Increase exercise as tolerated      Essential hypertension - Primary (Chronic)    Well controlled, no changes to meds. Encouraged heart healthy diet such as the DASH diet and exercise as tolerated.       Relevant Orders   CBC (Completed)   Comprehensive metabolic panel (Completed)   TSH (Completed)   Hyperglycemia    hgba1c acceptable, minimize simple carbs. Increase exercise as tolerated.  Pulmonary HTN ([place])    Following with pulmonology and they have her on new meds and she is stable.          I am having Carlea Badour. Boyers start on ketoconazole. I am also having her maintain her Cinnamon, Fish Oil, Vitamin D, Coenzyme Q10, aspirin EC, traMADol, BESIVANCE, Glucosamine HCl (GLUCOSAMINE PO), vitamin B-12, albuterol, albuterol, levothyroxine, ketoconazole, atenolol, budesonide-formoterol, gabapentin, atenolol, calcium carbonate, fexofenadine, acetaminophen, aerochamber plus with mask, nystatin, tadalafil (PAH), hydrochlorothiazide, citalopram, rosuvastatin, and Selexipag.  Meds ordered this encounter  Medications  . ketoconazole (NIZORAL) 2 % cream    Sig: Apply 1 application topically daily.    Dispense:  15 g    Refill:  0     Penni Homans, MD

## 2018-05-16 NOTE — Assessment & Plan Note (Signed)
Tolerating statin, encouraged heart healthy diet, avoid trans fats, minimize simple carbs and saturated fats. Increase exercise as tolerated

## 2018-05-16 NOTE — Assessment & Plan Note (Signed)
hgba1c acceptable, minimize simple carbs. Increase exercise as tolerated.  

## 2018-05-17 ENCOUNTER — Encounter: Payer: Self-pay | Admitting: Pulmonary Disease

## 2018-05-17 ENCOUNTER — Ambulatory Visit (INDEPENDENT_AMBULATORY_CARE_PROVIDER_SITE_OTHER): Payer: Medicare Other | Admitting: Pulmonary Disease

## 2018-05-17 VITALS — BP 122/74 | HR 59 | Ht 63.0 in | Wt 168.4 lb

## 2018-05-17 DIAGNOSIS — J9611 Chronic respiratory failure with hypoxia: Secondary | ICD-10-CM | POA: Diagnosis not present

## 2018-05-17 DIAGNOSIS — E669 Obesity, unspecified: Secondary | ICD-10-CM

## 2018-05-17 DIAGNOSIS — I272 Pulmonary hypertension, unspecified: Secondary | ICD-10-CM | POA: Diagnosis not present

## 2018-05-17 DIAGNOSIS — Z6830 Body mass index (BMI) 30.0-30.9, adult: Secondary | ICD-10-CM | POA: Diagnosis not present

## 2018-05-17 DIAGNOSIS — J439 Emphysema, unspecified: Secondary | ICD-10-CM

## 2018-05-17 MED ORDER — SELEXIPAG 800 MCG PO TABS: 2.0000 | ORAL_TABLET | Freq: Two times a day (BID) | ORAL | 4 refills | Status: DC

## 2018-05-17 NOTE — Patient Instructions (Signed)
Sarcoidosis: I am glad you have had a flu shot Practice good hand hygiene Stay active We will need to repeat a lung function test in early 2020, we can arrange that on the next visit  Pulmonary hypertension: Increase Uptravi to 1600 mcg twice a day Continue tadalafil 40 mg daily Keep fluid intake less than 2 L Keep sodium intake less than 2 g Weigh yourself daily Stay active, exercise regularly  Chronic respiratory failure with hypoxemia: Continue 4 L of oxygen continuously  Follow-up 4 weeks

## 2018-05-17 NOTE — Progress Notes (Signed)
Subjective:   PATIENT ID: Kimberly Irwin GENDER: female DOB: Nov 14, 1945, MRN: [id number]   Synopsis: Former patient of Dr. Joya Gaskins and Dr. Corrie Dandy who has sarcoidosis and centrilobular emphysema.  She was diagnosed with WHO group 1 and 5 pulmonary hypertension in 2019.   HPI  Chief Complaint  Patient presents with  . Follow-up    1 month follow up. Patient states that her breathing is about the same.    Natacia tells me for the first time since I have known her that her breathing is actually a little better.  She says that she continues to struggle with side effects from the [place] but she is managing them.  Specifically she says that she has diarrhea all the time, headache sporadically, and nausea.  She also has some leg aches.  She says that the leg extends to come on after she does her physical therapy exercises.  In general though she says the symptoms have been worse in the past and she does not feel like they are bad enough for her to consider stopping the medicine.  She says that she continues to use oxygen 4 L daily.  She has been exercising more and she actually noticed this week that when she was walking laps that her O2 saturation only dropped down to the high 80s which is good for her.  No leg swelling.  She has not been sick.  Past Medical History:  Diagnosis Date  . Bursitis of left hip 10/26/2012  . Chronic low back pain   . Chronic respiratory failure with hypoxia ([place]) 10/21/2016  . Coronary artery calcification 07/03/2016  . DDD (degenerative disc disease)    L3-4 with facet arthropathy and stenosis  . Degenerative spondylolisthesis    L4-5 grade 1 with stenosis  . Emphysema lung ([place])   . GERD (gastroesophageal reflux disease)   . Hiatal hernia   . Hyperlipidemia   . Hypertension   . Hypothyroidism 10/28/2015  . Pain in joint, lower leg 10/26/2012   left   . Pneumonia    as a child several times.   . Pulmonary hypertension ([place])   . Sarcoidosis   . Tremors  of nervous system         Review of Systems  Constitutional: Negative for chills, diaphoresis and malaise/fatigue.  HENT: Negative for congestion and ear discharge.   Respiratory: Positive for shortness of breath. Negative for cough, sputum production, wheezing and stridor.   Cardiovascular: Negative for chest pain, palpitations, claudication and leg swelling.      Objective:  Physical Exam   Vitals:   05/17/18 1443  BP: 122/74  Pulse: (!) 59  SpO2: 91%  Weight: 168 lb 6.4 oz (76.4 kg)  Height: _0  (1.6 m)   4L Continuously   Gen: chronically ill appearing HENT: OP clear, neck supple PULM: Crackles bases, otherwise clear B, normal percussion CV: RRR, no mgr, trace edema GI: BS+, soft, nontender Derm: no cyanosis or rash Psyche: normal mood and affect    CBC    Component Value Date/Time   WBC 10.2 05/10/2018 1502   RBC 4.97 05/10/2018 1502   HGB 15.0 05/10/2018 1502   HCT 44.1 05/10/2018 1502   PLT 191.0 05/10/2018 1502   MCV 88.6 05/10/2018 1502   MCH 30.1 01/27/2018 0618   MCHC 34.0 05/10/2018 1502   RDW 13.8 05/10/2018 1502   LYMPHSABS 1.2 01/27/2018 0618   MONOABS 1.2 (H) 01/27/2018 0618   EOSABS 0.0 01/27/2018  6144   BASOSABS 0.0 01/27/2018 0618     Chest imaging: CT chest October 2017 showed no PE . Mild emphysematous changes.  Images independently reviewed by me showing some air trapping and mosaicism.  Some cysts noted in the lower lobes. March 2019 high-resolution CT scan of the chest images independently reviewed, nonspecific interstitial changes in the right greater than left base which are very mild in distribution and overall severity, upper lobe predominant mild to moderate centrilobular emphysema noted, bilateral pulmonary nodules, largest 5 mm, aortic atherosclerosis noted 12/2017 V/Q Scan IMPRESSION:Normal perfusion lung scan. Minimally diminished ventilation to the RIGHT apex.  PFT: PFT (05/2016) Ratio 70% FEV1 1.59 72%, DLCO 35%  (flow volume loop consistent with airflow obstruction) PFT (10/2017) ratio 71%, FEV1 1.83 L 84%, FVC 2.43 L 84%, total lung capacity 3.83 L 75%, DLCO 6.15 25%  Labs:  Path:  Echo: 2-D echo on 04/09/2016 showed an EF of 31-54%, grade 1 diastolic dysfunction. Pulmonary artery pressure 44 mmHg  Heart Catheterization: 11/2017 RHC RA mean 2 RV 66/5 PA 66/16, mean 34 PCWP mean 3 Oxygen saturations: PA 60% AO 92% Cardiac Output (Fick) 3.73 Cardiac Index (Fick) 2.04 PVR 8.3 WU  Exhaled NO: FeNO is 20ppm 05/2016   Other: Stress Myoview 2011 neg for ischemia .  Records reviewed from her visit with cardiology on July 30 where OPSUMIT was stopped, efforts were made to start [facility] from April 07, 2018 reviewed, chemistry was within normal limits    Assessment & Plan:   Pulmonary emphysema, unspecified emphysema type ([place])  Pulmonary HTN ([facility])  Chronic respiratory failure with hypoxia ([facility])  Class 1 obesity without serious comorbidity with body mass index (BMI) of 30.0 to 30.9 in adult, unspecified obesity type  Discussion: Dayjah continues to struggle with chronic respiratory failure with hypoxemia secondary to [facility] group 1 and 5 pulmonary hypertension in the setting of underlying sarcoidosis.  As stated previously I do not have evidence that the sarcoid has progressed.  Today she says for the first time that she actually feels a little bit better which is remarkable.  I am impressed by her ability to tolerate the side effects from [place].  Sarcoidosis: I am glad you have had a flu shot Continue symbicort Practice good hand hygiene Stay active We will need to repeat a lung function test in early 2020, we can arrange that on the next visit  Pulmonary hypertension: Increase Uptravi to 1600 mcg twice a day Continue tadalafil 40 mg daily Keep fluid intake less than 2 L Keep sodium intake less than 2 g Weigh yourself daily Stay active, exercise  regularly  Chronic respiratory failure with hypoxemia: Continue 4 L of oxygen continuously  Follow-up 4 weeks  Greater than 50% of this 25-minute visit spent face-to-face   Current Outpatient Medications:  .  acetaminophen (TYLENOL) 500 MG tablet, Take 1,000 mg by mouth every 6 (six) hours as needed for moderate pain., Disp: , Rfl:  .  albuterol (PROVENTIL HFA;VENTOLIN HFA) 108 (90 Base) MCG/ACT inhaler, Inhale 2 puffs into the lungs every 6 (six) hours as needed for wheezing or shortness of breath., Disp: 1 Inhaler, Rfl: 6 .  albuterol (PROVENTIL) (2.5 MG/3ML) 0.083% nebulizer solution, Take 3 mLs (2.5 mg total) by nebulization every 6 (six) hours as needed for wheezing or shortness of breath., Disp: 30 mL, Rfl: 4 .  aspirin EC 81 MG tablet, Take 81 mg by mouth at bedtime. , Disp: , Rfl:  .  atenolol (  TENORMIN) 50 MG tablet, TAKE 1 TABLET (50 MG TOTAL) BY MOUTH DAILY. (Patient taking differently: Take 50 mg by mouth every morning. ), Disp: 90 tablet, Rfl: 1 .  BESIVANCE 0.6 % SUSP, Place 1 drop into the left eye See admin instructions. INSTILL 1 DROP INTO LEFT 4 TIMES DAILY x 2 DAYS AFTER EYE INJECTION, Disp: , Rfl: 12 .  budesonide-formoterol (SYMBICORT) 160-4.5 MCG/ACT inhaler, Inhale 2 puffs into the lungs 2 (two) times daily., Disp: 1 Inhaler, Rfl: 0 .  calcium carbonate (OSCAL) 1500 (600 Ca) MG TABS tablet, Take 1,200 mg by mouth daily with breakfast., Disp: , Rfl:  .  Cholecalciferol (VITAMIN D) 2000 UNITS tablet, Take 2,000 Units by mouth every evening. , Disp: , Rfl:  .  Cinnamon 500 MG capsule, Take 2,000 mg by mouth daily.  , Disp: , Rfl:  .  citalopram (CELEXA) 10 MG tablet, TAKE 1 TABLET BY MOUTH EVERY DAY, Disp: 90 tablet, Rfl: 1 .  Coenzyme Q10 200 MG capsule, Take 200 mg by mouth at bedtime. , Disp: , Rfl:  .  fexofenadine (ALLEGRA) 180 MG tablet, Take 180 mg by mouth daily., Disp: , Rfl:  .  gabapentin (NEURONTIN) 300 MG capsule, TAKE 1 CAPSULE (300 MG TOTAL) BY MOUTH AT  BEDTIME., Disp: 90 capsule, Rfl: 2 .  Glucosamine HCl (GLUCOSAMINE PO), Take 1 tablet by mouth every evening. , Disp: , Rfl:  .  hydrochlorothiazide (MICROZIDE) 12.5 MG capsule, Take 1 capsule (12.5 mg total) by mouth daily., Disp: 90 capsule, Rfl: 3 .  ketoconazole (NIZORAL) 2 % cream, Apply 1 application topically daily., Disp: 15 g, Rfl: 0 .  ketoconazole (NIZORAL) 2 % shampoo, Apply 1 application topically 2 (two) times a week. (Patient taking differently: Apply 1 application topically every 14 (fourteen) days. ONCE EVERY 2 WEEKS PER PATIENT), Disp: 120 mL, Rfl: 03 .  levothyroxine (SYNTHROID, LEVOTHROID) 112 MCG tablet, TAKE 1 TABLET (112 MCG TOTAL) BY MOUTH DAILY BEFORE BREAKFAST., Disp: 90 tablet, Rfl: 1 .  nystatin (MYCOSTATIN) 100000 UNIT/ML suspension, Take 5 mLs (500,000 Units total) by mouth 4 (four) times daily., Disp: 60 mL, Rfl: 0 .  Omega-3 Fatty Acids (FISH OIL) 1200 MG CAPS, Take 1,200 mg by mouth daily. , Disp: , Rfl:  .  rosuvastatin (CRESTOR) 20 MG tablet, TAKE 1 TABLET (20 MG TOTAL) BY MOUTH DAILY., Disp: 90 tablet, Rfl: 2 .  Selexipag (UPTRAVI) 200 MCG TABS, Take 1 tablet (200 mcg total) by mouth 2 (two) times daily., Disp: 60 tablet, Rfl: 5 .  Spacer/Aero-Holding Chambers (AEROCHAMBER PLUS WITH MASK) inhaler, Use as instructed, Disp: 1 each, Rfl: 2 .  tadalafil, PAH, (ADCIRCA) 20 MG tablet, Take 2 tablets (40 mg total) by mouth daily., Disp: 60 tablet, Rfl: 11 .  traMADol (ULTRAM) 50 MG tablet, Take 2 tablets (100 mg total) by mouth every 6 (six) hours as needed for moderate pain or severe pain., Disp: 120 tablet, Rfl: 0 .  vitamin B-12 (CYANOCOBALAMIN) 1000 MCG tablet, Take 1,000 mcg by mouth daily., Disp: , Rfl:

## 2018-05-17 NOTE — Addendum Note (Signed)
Addended by: Valerie Salts on: 05/17/2018 03:37 PM   Modules accepted: Orders

## 2018-06-01 ENCOUNTER — Encounter (INDEPENDENT_AMBULATORY_CARE_PROVIDER_SITE_OTHER): Payer: Medicare Other | Admitting: Ophthalmology

## 2018-06-01 DIAGNOSIS — H34832 Tributary (branch) retinal vein occlusion, left eye, with macular edema: Secondary | ICD-10-CM

## 2018-06-01 DIAGNOSIS — H43813 Vitreous degeneration, bilateral: Secondary | ICD-10-CM

## 2018-06-01 DIAGNOSIS — H35033 Hypertensive retinopathy, bilateral: Secondary | ICD-10-CM | POA: Diagnosis not present

## 2018-06-01 DIAGNOSIS — I10 Essential (primary) hypertension: Secondary | ICD-10-CM

## 2018-06-01 DIAGNOSIS — Z1231 Encounter for screening mammogram for malignant neoplasm of breast: Secondary | ICD-10-CM | POA: Diagnosis not present

## 2018-06-01 DIAGNOSIS — Z803 Family history of malignant neoplasm of breast: Secondary | ICD-10-CM | POA: Diagnosis not present

## 2018-06-01 LAB — HM MAMMOGRAPHY

## 2018-06-14 ENCOUNTER — Encounter: Payer: Self-pay | Admitting: Family Medicine

## 2018-06-17 ENCOUNTER — Other Ambulatory Visit: Payer: Self-pay | Admitting: Pulmonary Disease

## 2018-06-17 ENCOUNTER — Encounter: Payer: Self-pay | Admitting: Pulmonary Disease

## 2018-06-17 ENCOUNTER — Ambulatory Visit (INDEPENDENT_AMBULATORY_CARE_PROVIDER_SITE_OTHER): Payer: Medicare Other | Admitting: Pulmonary Disease

## 2018-06-17 VITALS — BP 108/64 | HR 64 | Ht 63.0 in | Wt 168.0 lb

## 2018-06-17 DIAGNOSIS — D869 Sarcoidosis, unspecified: Secondary | ICD-10-CM

## 2018-06-17 DIAGNOSIS — I272 Pulmonary hypertension, unspecified: Secondary | ICD-10-CM

## 2018-06-17 DIAGNOSIS — R52 Pain, unspecified: Secondary | ICD-10-CM

## 2018-06-17 DIAGNOSIS — J9611 Chronic respiratory failure with hypoxia: Secondary | ICD-10-CM

## 2018-06-17 DIAGNOSIS — R739 Hyperglycemia, unspecified: Secondary | ICD-10-CM

## 2018-06-17 DIAGNOSIS — J439 Emphysema, unspecified: Secondary | ICD-10-CM

## 2018-06-17 DIAGNOSIS — M5440 Lumbago with sciatica, unspecified side: Secondary | ICD-10-CM

## 2018-06-17 MED ORDER — PREDNISONE 10 MG PO TABS: ORAL_TABLET | ORAL | 0 refills | Status: DC

## 2018-06-17 MED ORDER — TRAMADOL HCL 50 MG PO TABS: 100.0000 mg | ORAL_TABLET | Freq: Four times a day (QID) | ORAL | 0 refills | Status: DC | PRN

## 2018-06-17 NOTE — Progress Notes (Signed)
Subjective:   PATIENT ID: Kimberly Irwin GENDER: female DOB: 05/01/1946, MRN: [id number]   Synopsis: Former patient of Dr. Joya Gaskins and Dr. Corrie Dandy who has sarcoidosis and centrilobular emphysema.  She was diagnosed with WHO group 1 and 5 pulmonary hypertension in 2019. Started treatment for pulmonary hypertension in 2019. Tolerates tadalafil, selexipag, but macitentan caused swelling, headaches, nausea.   HPI  Chief Complaint  Patient presents with  . Follow-up    Pt is doing well overall since last ov.   Kimberly Irwin says she is "no better".  She says that she has a constant headache and diarrhea, yesterday was a particular bad day.  She has been taking Tylenol for this.  She continues to use 4 L of oxygen continuously and her O2 saturation will routinely dropped down to 70% or lower.  In general she says that she has made no progress since we started treatment in the spring of this year.  She is very frustrated about the severity of her symptoms.  She has not been sick with bronchitis, she does not cough, she does not have chest pain or tightness.  She continues to be compliant with Symbicort.  She has not had leg swelling or weight gain.  Past Medical History:  Diagnosis Date  . Bursitis of left hip 10/26/2012  . Chronic low back pain   . Chronic respiratory failure with hypoxia ([place]) 10/21/2016  . Coronary artery calcification 07/03/2016  . DDD (degenerative disc disease)    L3-4 with facet arthropathy and stenosis  . Degenerative spondylolisthesis    L4-5 grade 1 with stenosis  . Emphysema lung ([place])   . GERD (gastroesophageal reflux disease)   . Hiatal hernia   . Hyperlipidemia   . Hypertension   . Hypothyroidism 10/28/2015  . Pain in joint, lower leg 10/26/2012   left   . Pneumonia    as a child several times.   . Pulmonary hypertension ([place])   . Sarcoidosis   . Tremors of nervous system         Review of Systems  Constitutional: Negative for chills, diaphoresis and  malaise/fatigue.  HENT: Negative for congestion and ear discharge.   Respiratory: Positive for shortness of breath. Negative for cough, sputum production, wheezing and stridor.   Cardiovascular: Negative for chest pain, palpitations, claudication and leg swelling.      Objective:  Physical Exam   Vitals:   06/17/18 1445  BP: 108/64  Pulse: 64  SpO2: 93%  Weight: 168 lb (76.2 kg)  Height: _0  (1.6 m)   4L Continuously  Gen: chronically ill appearing HENT: OP clear, TM's clear, neck supple PULM: CTA B, normal percussion CV: RRR, no mgr, trace edema GI: BS+, soft, nontender Derm: no cyanosis or rash Psyche: Depressed mood and affect    CBC    Component Value Date/Time   WBC 10.2 05/10/2018 1502   RBC 4.97 05/10/2018 1502   HGB 15.0 05/10/2018 1502   HCT 44.1 05/10/2018 1502   PLT 191.0 05/10/2018 1502   MCV 88.6 05/10/2018 1502   MCH 30.1 01/27/2018 0618   MCHC 34.0 05/10/2018 1502   RDW 13.8 05/10/2018 1502   LYMPHSABS 1.2 01/27/2018 0618   MONOABS 1.2 (H) 01/27/2018 0618   EOSABS 0.0 01/27/2018 0618   BASOSABS 0.0 01/27/2018 0618     Chest imaging: CT chest October 2017 showed no PE . Mild emphysematous changes.  Images independently reviewed by me showing some air trapping and mosaicism.  Some cysts noted in the lower lobes. March 2019 high-resolution CT scan of the chest images independently reviewed, nonspecific interstitial changes in the right greater than left base which are very mild in distribution and overall severity, upper lobe predominant mild to moderate centrilobular emphysema noted, bilateral pulmonary nodules, largest 5 mm, aortic atherosclerosis noted 12/2017 V/Q Scan IMPRESSION:Normal perfusion lung scan. Minimally diminished ventilation to the RIGHT apex.  PFT: PFT (05/2016) Ratio 70% FEV1 1.59 72%, DLCO 35% (flow volume loop consistent with airflow obstruction) PFT (10/2017) ratio 71%, FEV1 1.83 L 84%, FVC 2.43 L 84%, total lung  capacity 3.83 L 75%, DLCO 6.15 25%  Labs:  Path:  Echo: 2-D echo on 04/09/2016 showed an EF of 67-12%, grade 1 diastolic dysfunction. Pulmonary artery pressure 44 mmHg  Heart Catheterization: 11/2017 RHC RA mean 2 RV 66/5 PA 66/16, mean 34 PCWP mean 3 Oxygen saturations: PA 60% AO 92% Cardiac Output (Fick) 3.73 Cardiac Index (Fick) 2.04 PVR 8.3 WU  Exhaled NO: FeNO is 20ppm 05/2016   Other: Stress Myoview 2011 neg for ischemia .     Assessment & Plan:   Pulmonary HTN ([place])  Pulmonary emphysema, unspecified emphysema type ([place])  Chronic respiratory failure with hypoxia ([place])  Sarcoidosis stage I  Discussion: Kimberly Irwin is still struggling with daily shortness of breath.  We are treating sarcoidosis which has no evidence of progression, centrilobular emphysema which is also fairly mild, and mild to moderate pulmonary hypertension.  Despite treatment for all of these while encouraging her to exercise more she remains profoundly short of breath.  Fortunately I have not seen signs or symptoms on physical exam of clear worsening of her underlying disease.  I think it would be reasonable to get another echocardiogram over the next few weeks.  Today we had a lengthy conversation about symptom management.  I do not think that she would benefit from trying an endothelium receptor antagonist again because of the side effects she had previously.  I offered a second opinion at [facility], maintaining current therapy, or considering morphine as needed for relief of shortness of breath.  I do not think she is at a point where she needs hospice but we could consider a more palliative approach by using morphine.  She is reluctant to do this because of prior bad experiences with morphine use and about a 13.  Of opiate dependence.  I think it is reasonable to try treating the sarcoid with a brief course of prednisone to see if this helps.  In the meantime I would maintain her pulmonary vasodilator therapy as I  believe this is preventing progression of heart failure.  Plan: Chronic respiratory failure with hypoxemia: Keep using 4 L of oxygen at rest When exercising use  Sarcoidosis: Try taking prednisone 20 mg daily x3 weeks, then 10 mg daily x3 weeks  Centrilobular emphysema: Continue Symbicort 2 puffs twice a day  Pulmonary hypertension: Continue Uptravi 1600 mcg twice a day Continue tadalafil as you are doing  Headache related to taking your pulmonary hypertension medicine: Use tramadol as needed for headache, call me for refill  Diarrhea related to your pulmonary hypertension medicine: Try the brat diet (banana, rice, applesauce, toast) Use Imodium as needed  We will see you back in 6 weeks or sooner if needed  Greater than 50% of this 40-minute visit was spent face-to-face   Current Outpatient Medications:  .  acetaminophen (TYLENOL) 500 MG tablet, Take 1,000 mg by mouth every 6 (six) hours as needed for moderate  pain., Disp: , Rfl:  .  albuterol (PROVENTIL HFA;VENTOLIN HFA) 108 (90 Base) MCG/ACT inhaler, Inhale 2 puffs into the lungs every 6 (six) hours as needed for wheezing or shortness of breath., Disp: 1 Inhaler, Rfl: 6 .  albuterol (PROVENTIL) (2.5 MG/3ML) 0.083% nebulizer solution, Take 3 mLs (2.5 mg total) by nebulization every 6 (six) hours as needed for wheezing or shortness of breath., Disp: 30 mL, Rfl: 4 .  aspirin EC 81 MG tablet, Take 81 mg by mouth at bedtime. , Disp: , Rfl:  .  atenolol (TENORMIN) 50 MG tablet, TAKE 1 TABLET (50 MG TOTAL) BY MOUTH DAILY. (Patient taking differently: Take 50 mg by mouth every morning. ), Disp: 90 tablet, Rfl: 1 .  BESIVANCE 0.6 % SUSP, Place 1 drop into the left eye See admin instructions. INSTILL 1 DROP INTO LEFT 4 TIMES DAILY x 2 DAYS AFTER EYE INJECTION, Disp: , Rfl: 12 .  budesonide-formoterol (SYMBICORT) 160-4.5 MCG/ACT inhaler, Inhale 2 puffs into the lungs 2 (two) times daily., Disp: 1 Inhaler, Rfl: 0 .  calcium carbonate  (OSCAL) 1500 (600 Ca) MG TABS tablet, Take 1,200 mg by mouth daily with breakfast., Disp: , Rfl:  .  Cholecalciferol (VITAMIN D) 2000 UNITS tablet, Take 2,000 Units by mouth every evening. , Disp: , Rfl:  .  Cinnamon 500 MG capsule, Take 2,000 mg by mouth daily.  , Disp: , Rfl:  .  citalopram (CELEXA) 10 MG tablet, TAKE 1 TABLET BY MOUTH EVERY DAY, Disp: 90 tablet, Rfl: 1 .  Coenzyme Q10 200 MG capsule, Take 200 mg by mouth at bedtime. , Disp: , Rfl:  .  fexofenadine (ALLEGRA) 180 MG tablet, Take 180 mg by mouth daily., Disp: , Rfl:  .  gabapentin (NEURONTIN) 300 MG capsule, TAKE 1 CAPSULE (300 MG TOTAL) BY MOUTH AT BEDTIME., Disp: 90 capsule, Rfl: 2 .  Glucosamine HCl (GLUCOSAMINE PO), Take 1 tablet by mouth every evening. , Disp: , Rfl:  .  hydrochlorothiazide (MICROZIDE) 12.5 MG capsule, Take 1 capsule (12.5 mg total) by mouth daily., Disp: 90 capsule, Rfl: 3 .  ketoconazole (NIZORAL) 2 % cream, Apply 1 application topically daily., Disp: 15 g, Rfl: 0 .  ketoconazole (NIZORAL) 2 % shampoo, Apply 1 application topically 2 (two) times a week. (Patient taking differently: Apply 1 application topically every 14 (fourteen) days. ONCE EVERY 2 WEEKS PER PATIENT), Disp: 120 mL, Rfl: 03 .  levothyroxine (SYNTHROID, LEVOTHROID) 112 MCG tablet, TAKE 1 TABLET (112 MCG TOTAL) BY MOUTH DAILY BEFORE BREAKFAST., Disp: 90 tablet, Rfl: 1 .  nystatin (MYCOSTATIN) 100000 UNIT/ML suspension, Take 5 mLs (500,000 Units total) by mouth 4 (four) times daily., Disp: 60 mL, Rfl: 0 .  Omega-3 Fatty Acids (FISH OIL) 1200 MG CAPS, Take 1,200 mg by mouth daily. , Disp: , Rfl:  .  rosuvastatin (CRESTOR) 20 MG tablet, TAKE 1 TABLET (20 MG TOTAL) BY MOUTH DAILY., Disp: 90 tablet, Rfl: 2 .  Selexipag (UPTRAVI) 800 MCG TABS, Take 2 tablets (1,600 mcg total) by mouth 2 (two) times daily., Disp: 60 tablet, Rfl: 4 .  Spacer/Aero-Holding Chambers (AEROCHAMBER PLUS WITH MASK) inhaler, Use as instructed, Disp: 1 each, Rfl: 2 .   tadalafil, PAH, (ADCIRCA) 20 MG tablet, Take 2 tablets (40 mg total) by mouth daily., Disp: 60 tablet, Rfl: 11 .  traMADol (ULTRAM) 50 MG tablet, Take 2 tablets (100 mg total) by mouth every 6 (six) hours as needed for moderate pain or severe pain., Disp: 120 tablet, Rfl: 0 .  vitamin B-12 (CYANOCOBALAMIN) 1000 MCG tablet, Take 1,000 mcg by mouth daily., Disp: , Rfl:

## 2018-06-17 NOTE — Patient Instructions (Signed)
Chronic respiratory failure with hypoxemia: Keep using 4 L of oxygen at rest When exercising use  Sarcoidosis: Try taking prednisone 20 mg daily x3 weeks, then 10 mg daily x3 weeks  Centrilobular emphysema: Continue Symbicort 2 puffs twice a day  Pulmonary hypertension: Continue Uptravi 1600 mcg twice a day Continue tadalafil as you are doing  Headache related to taking your pulmonary hypertension medicine: Use tramadol as needed for headache, call me for refill  Diarrhea related to your pulmonary hypertension medicine: Try the brat diet (banana, rice, applesauce, toast) Use Imodium as needed  We will see you back in 6 weeks or sooner if needed

## 2018-06-18 ENCOUNTER — Other Ambulatory Visit: Payer: Self-pay | Admitting: Family Medicine

## 2018-06-18 ENCOUNTER — Ambulatory Visit (HOSPITAL_COMMUNITY)
Admission: RE | Admit: 2018-06-18 | Discharge: 2018-06-18 | Disposition: A | Discharge status: Home / Self Care | Payer: Medicare Other | Source: Ambulatory Visit | Attending: Cardiology | Admitting: Cardiology

## 2018-06-18 VITALS — BP 122/80 | HR 58 | Wt 168.4 lb

## 2018-06-18 DIAGNOSIS — J439 Emphysema, unspecified: Secondary | ICD-10-CM | POA: Insufficient documentation

## 2018-06-18 DIAGNOSIS — Z87891 Personal history of nicotine dependence: Secondary | ICD-10-CM | POA: Diagnosis not present

## 2018-06-18 DIAGNOSIS — Z9981 Dependence on supplemental oxygen: Secondary | ICD-10-CM | POA: Diagnosis not present

## 2018-06-18 DIAGNOSIS — E785 Hyperlipidemia, unspecified: Secondary | ICD-10-CM | POA: Insufficient documentation

## 2018-06-18 DIAGNOSIS — J9611 Chronic respiratory failure with hypoxia: Secondary | ICD-10-CM | POA: Diagnosis not present

## 2018-06-18 DIAGNOSIS — I2510 Atherosclerotic heart disease of native coronary artery without angina pectoris: Secondary | ICD-10-CM | POA: Insufficient documentation

## 2018-06-18 DIAGNOSIS — Z7982 Long term (current) use of aspirin: Secondary | ICD-10-CM | POA: Insufficient documentation

## 2018-06-18 DIAGNOSIS — D860 Sarcoidosis of lung: Secondary | ICD-10-CM | POA: Insufficient documentation

## 2018-06-18 DIAGNOSIS — I10 Essential (primary) hypertension: Secondary | ICD-10-CM | POA: Insufficient documentation

## 2018-06-18 DIAGNOSIS — E039 Hypothyroidism, unspecified: Secondary | ICD-10-CM | POA: Insufficient documentation

## 2018-06-18 DIAGNOSIS — Z79899 Other long term (current) drug therapy: Secondary | ICD-10-CM | POA: Diagnosis not present

## 2018-06-18 DIAGNOSIS — I272 Pulmonary hypertension, unspecified: Secondary | ICD-10-CM

## 2018-06-18 NOTE — Patient Instructions (Signed)
Your physician has requested that you have an echocardiogram. Echocardiography is a painless test that uses sound waves to create images of your heart. It provides your doctor with information about the size and shape of your heart and how well your heart's chambers and valves are working. This procedure takes approximately one hour. There are no restrictions for this procedure.  Your physician recommends that you schedule a follow-up appointment in: 4 months

## 2018-06-20 NOTE — Progress Notes (Signed)
PCP: Dr. Charlett Blake Pulmonary: Dr. Lake Bells Cardiology: Dr. Aundra Dubin  72 y.o. with history of sarcoidosis and emphysema was referred by Dr Lake Bells for evaluation of pulmonary hypertension.  She has a diagnosis of sarcoidosis, but CT chest in 3/19 did not show marked parenchymal disease.  PFTs showed very low DLCO.  She had RHC done in 4/19 with moderate pulmonary hypertension and very high PVR.    She is now wearing 2 L [place] at all times.  She does not smoke, says she quit > 20 years ago.    Echo in 6/19 showed EF 55-60%, mildly dilated RV with moderately decreased systolic function, PASP 83 mmHg, aortic sclerosis without significant stenosis.   She did not tolerate Opsumit.   She returns for f/u of pulmonary HTN.  Wears 4 L O2 at all times.  Weight is down 3 lbs.  She is on Adcirca and selexipag now, really has not felt better on PH meds.  She has loose stool and occasional headaches with selexipag.  Daughter feels like she has had at least some improvement.  Still very limited and short of breath with most exertion.  No chest pain.  No lightheadedness.   6 minute walk (5/19): 122 m 6 minute walk (9/19):  39 m (only walked for about 1 minute).   Labs (3/19): LDL 53 Labs (4/19): hgb 8.5, K 4.8, creatinine 1.06 Labs (5/19): anti-SCL70 negative, ANA negative, RF negative, ANCA negative, HIV negative, anti-centromere negative.  Labs (6/19): K 4, creatinine 1.03 Labs (9/19): K 4.6, creatinine 1.18 Labs (10/19): TSH normal, K 4.1, creatinine 1.  PMH: 1. Sarcoidosis: On home oxygen.  - PFTs (3/19): FEV1 84%, FVC 84%, ratio 71%, TLC 75%, DLCO 25% - CT (3/19) with mild fibrotic changes in the lung bases.  2. Emphysema: Moderate emphysema on CT chest 3/19.  3. CAD: Calcification of coronaries on CT chest in 3/19.  - Cardiolite in 1/19: EF 65%, no ischemia/infarction.  4. HTN 5. Hyperlipidemia 6. Hypothyroidism 7. Anemia 8. Pulmonary hypertension:  - Echo (9/17): EF 60-65%, PASP 44 mmHg.  - RHC  (4/19): mean RA 2, PA 66/16 mean 34, mean PCWP 3, PVR 8.3 WU, CI 2.04 - Autoimmune serologies negative.  - V/Q scan did not show evidence for chronic PE.  - Echo (6/19): EF 55-60%, mildly dilated RV with moderately decreased systolic function, PASP 83 mmHg, aortic sclerosis without significant stenosis.   Review of systems complete and found to be negative unless listed in HPI.   Social History   Socioeconomic History  . Marital status: Widowed    Spouse name: Not on file  . Number of children: 2  . Years of education: Not on file  . Highest education level: Not on file  Occupational History  . Not on file  Social Needs  . Financial resource strain: Not on file  . Food insecurity:    Worry: Not on file    Inability: Not on file  . Transportation needs:    Medical: Not on file    Non-medical: Not on file  Tobacco Use  . Smoking status: Former Smoker    Packs/day: 0.50    Years: 32.00    Pack years: 16.00    Types: Cigarettes    Last attempt to quit: 07/22/1994    Years since quitting: 23.9  . Smokeless tobacco: Never Used  Substance and Sexual Activity  . Alcohol use: No    Alcohol/week: 0.0 standard drinks  . Drug use: No  . Sexual  activity: Not Currently  Lifestyle  . Physical activity:    Days per week: Not on file    Minutes per session: Not on file  . Stress: Not on file  Relationships  . Social connections:    Talks on phone: Not on file    Gets together: Not on file    Attends religious service: Not on file    Active member of club or organization: Not on file    Attends meetings of clubs or organizations: Not on file    Relationship status: Not on file  . Intimate partner violence:    Fear of current or ex partner: Not on file    Emotionally abused: Not on file    Physically abused: Not on file    Forced sexual activity: Not on file  Other Topics Concern  . Not on file  Social History Narrative   Married 59 years and has 2 children (2 daughters)    Alcohol Use - no   Former Smoker - she quit 10 years ago, started when she was 68 and has had varying level of use of tobacco products from 1/2 pack to 3 packs per day.   Family History  Problem Relation Age of Onset  . Cancer Mother 28       Breast  . Heart disease Mother   . Hypertension Mother   . Hyperlipidemia Mother   . Heart attack Mother   . Asthma Maternal Grandmother     Current Outpatient Medications  Medication Sig Dispense Refill  . acetaminophen (TYLENOL) 500 MG tablet Take 1,000 mg by mouth every 6 (six) hours as needed for moderate pain.    Marland Kitchen albuterol (PROVENTIL HFA;VENTOLIN HFA) 108 (90 Base) MCG/ACT inhaler Inhale 2 puffs into the lungs every 6 (six) hours as needed for wheezing or shortness of breath. 1 Inhaler 6  . albuterol (PROVENTIL) (2.5 MG/3ML) 0.083% nebulizer solution Take 3 mLs (2.5 mg total) by nebulization every 6 (six) hours as needed for wheezing or shortness of breath. 30 mL 4  . aspirin EC 81 MG tablet Take 81 mg by mouth at bedtime.     Marland Kitchen atenolol (TENORMIN) 50 MG tablet TAKE 1 TABLET (50 MG TOTAL) BY MOUTH DAILY. (Patient taking differently: Take 50 mg by mouth every morning. ) 90 tablet 1  . BESIVANCE 0.6 % SUSP Place 1 drop into the left eye See admin instructions. INSTILL 1 DROP INTO LEFT 4 TIMES DAILY x 2 DAYS AFTER EYE INJECTION  12  . budesonide-formoterol (SYMBICORT) 160-4.5 MCG/ACT inhaler Inhale 2 puffs into the lungs 2 (two) times daily. 1 Inhaler 0  . calcium carbonate (OSCAL) 1500 (600 Ca) MG TABS tablet Take 1,200 mg by mouth daily with breakfast.    . Cholecalciferol (VITAMIN D) 2000 UNITS tablet Take 2,000 Units by mouth every evening.     . Cinnamon 500 MG capsule Take 2,000 mg by mouth daily.      . citalopram (CELEXA) 10 MG tablet TAKE 1 TABLET BY MOUTH EVERY DAY 90 tablet 1  . Coenzyme Q10 200 MG capsule Take 200 mg by mouth at bedtime.     . fexofenadine (ALLEGRA) 180 MG tablet Take 180 mg by mouth daily.    Marland Kitchen gabapentin  (NEURONTIN) 300 MG capsule TAKE 1 CAPSULE (300 MG TOTAL) BY MOUTH AT BEDTIME. 90 capsule 2  . Glucosamine HCl (GLUCOSAMINE PO) Take 1 tablet by mouth every evening.     . hydrochlorothiazide (MICROZIDE) 12.5 MG capsule Take 1  capsule (12.5 mg total) by mouth daily. 90 capsule 3  . ketoconazole (NIZORAL) 2 % cream Apply 1 application topically daily. 15 g 0  . ketoconazole (NIZORAL) 2 % shampoo Apply 1 application topically 2 (two) times a week. (Patient taking differently: Apply 1 application topically every 14 (fourteen) days. ONCE EVERY 2 WEEKS PER PATIENT) 120 mL 03  . nystatin (MYCOSTATIN) 100000 UNIT/ML suspension Take 5 mLs (500,000 Units total) by mouth 4 (four) times daily. 60 mL 0  . Omega-3 Fatty Acids (FISH OIL) 1200 MG CAPS Take 1,200 mg by mouth daily.     . predniSONE (DELTASONE) 10 MG tablet Take 2 tabs daily for 3 weeks, then 1 tab daily for 3 weeks then stop. 60 tablet 0  . rosuvastatin (CRESTOR) 20 MG tablet TAKE 1 TABLET (20 MG TOTAL) BY MOUTH DAILY. 90 tablet 2  . Selexipag (UPTRAVI) 800 MCG TABS Take 2 tablets (1,600 mcg total) by mouth 2 (two) times daily. 60 tablet 4  . Spacer/Aero-Holding Chambers (AEROCHAMBER PLUS WITH MASK) inhaler Use as instructed 1 each 2  . tadalafil, PAH, (ADCIRCA) 20 MG tablet Take 2 tablets (40 mg total) by mouth daily. 60 tablet 11  . traMADol (ULTRAM) 50 MG tablet Take 2 tablets (100 mg total) by mouth every 6 (six) hours as needed for moderate pain or severe pain. 120 tablet 0  . vitamin B-12 (CYANOCOBALAMIN) 1000 MCG tablet Take 1,000 mcg by mouth daily.    Marland Kitchen levothyroxine (SYNTHROID, LEVOTHROID) 112 MCG tablet TAKE 1 TABLET BY MOUTH DAILY BEFORE BREAKFAST. 90 tablet 1  . omeprazole (PRILOSEC) 40 MG capsule TAKE 1 CAPSULE (40 MG TOTAL) BY MOUTH DAILY. 90 capsule 1   No current facility-administered medications for this encounter.    BP 122/80   Pulse (!) 58   Wt 76.4 kg (168 lb 6.4 oz)   SpO2 93% Comment: 4L  BMI 29.83 kg/m   Wt Readings  from Last 3 Encounters:  06/18/18 76.4 kg (168 lb 6.4 oz)  06/17/18 76.2 kg (168 lb)  05/17/18 76.4 kg (168 lb 6.4 oz)   General: NAD Neck: No JVD, no thyromegaly or thyroid nodule.  Lungs: Mild crackles at bases.  CV: Nondisplaced PMI.  Heart regular S1/S2, no S3/S4, no murmur.  1+ ankle edema.  No carotid bruit.  Normal pedal pulses.  Abdomen: Soft, nontender, no hepatosplenomegaly, no distention.  Skin: Intact without lesions or rashes.  Neurologic: Alert and oriented x 3.  Psych: Normal affect. Extremities: No clubbing or cyanosis.  HEENT: Normal.   Assessment/Plan: 1. Pulmonary hypertension: Cath 4/19 with moderate PAH, very high PVR. Right and left heart filling pressures were low.  PFTs in 3/19 showed mild restriction with very low DLCO consistent with pulmonary vascular disease.  V/Q scan with no evidence for chronic PE and autoimmune serologies were negative. High resolution CT in 3/19 showed mild fibrotic changes at the lung bases and a degree of emphysema.  I do not think CT findings can explain her PH.  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  Echo 6/19 showed normal LV EF but mildly dilated/moderately dysfunctional RV with severe pulmonary hypertension. 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  As parenchymal disease does not appear particularly bad on CT, we have been trying her on pulmonary vasodilators. She did not tolerate Opsumit.  Now on selexipag and Adcirca but without much symptomatic improvement.  - Continue Adcirca 40 mg daily + Uptravi 1600 mcg BID for now.  - Echo in 4  months.  2. Chronic hypoxemic respiratory failure: She has pulmonary sarcoidosis and emphysema.  She is on home oxygen.  Pulmonary hypertension also likely plays a role in low oxygen saturation.   - Continue home oxygen.  - She is going to be starting a prednisone taper as recommended by Dr. Lake Bells to see if this helps her symptoms.  3. HTN:  - Continue HCTZ 12.5 mg daily and atenolol.  4.  Hyperlipidemia: Good lipids 3/19.   Followup in 4 months with echo.   Kimberly Irwin, 06/20/2018

## 2018-06-23 ENCOUNTER — Telehealth: Payer: Self-pay | Admitting: *Deleted

## 2018-06-23 NOTE — Telephone Encounter (Signed)
Received Physician Orders from [facility]; forwarded to provider/SLS 11/20

## 2018-06-29 ENCOUNTER — Telehealth (HOSPITAL_COMMUNITY): Payer: Self-pay | Admitting: Pharmacist

## 2018-06-29 NOTE — Telephone Encounter (Signed)
[employer]SIST patient assistance approved for Adcirca for up to 1 calenar year.   Ruta Hinds. Velva Harman, PharmD, BCPS, CPP Clinical Pharmacist Phone: [phone] 06/29/2018 1:57 PM

## 2018-07-20 ENCOUNTER — Ambulatory Visit (INDEPENDENT_AMBULATORY_CARE_PROVIDER_SITE_OTHER): Payer: Medicare Other | Admitting: Pulmonary Disease

## 2018-07-20 ENCOUNTER — Encounter: Payer: Self-pay | Admitting: Pulmonary Disease

## 2018-07-20 ENCOUNTER — Other Ambulatory Visit: Payer: Self-pay | Admitting: Pulmonary Disease

## 2018-07-20 ENCOUNTER — Encounter (INDEPENDENT_AMBULATORY_CARE_PROVIDER_SITE_OTHER): Payer: Medicare Other | Admitting: Ophthalmology

## 2018-07-20 VITALS — BP 95/57 | HR 76 | Ht 63.0 in | Wt 168.0 lb

## 2018-07-20 DIAGNOSIS — J9611 Chronic respiratory failure with hypoxia: Secondary | ICD-10-CM | POA: Diagnosis not present

## 2018-07-20 DIAGNOSIS — H43813 Vitreous degeneration, bilateral: Secondary | ICD-10-CM | POA: Diagnosis not present

## 2018-07-20 DIAGNOSIS — J439 Emphysema, unspecified: Secondary | ICD-10-CM

## 2018-07-20 DIAGNOSIS — D869 Sarcoidosis, unspecified: Secondary | ICD-10-CM | POA: Diagnosis not present

## 2018-07-20 DIAGNOSIS — H35033 Hypertensive retinopathy, bilateral: Secondary | ICD-10-CM | POA: Diagnosis not present

## 2018-07-20 DIAGNOSIS — H34832 Tributary (branch) retinal vein occlusion, left eye, with macular edema: Secondary | ICD-10-CM | POA: Diagnosis not present

## 2018-07-20 DIAGNOSIS — I272 Pulmonary hypertension, unspecified: Secondary | ICD-10-CM

## 2018-07-20 DIAGNOSIS — I10 Essential (primary) hypertension: Secondary | ICD-10-CM | POA: Diagnosis not present

## 2018-07-20 DIAGNOSIS — Z5181 Encounter for therapeutic drug level monitoring: Secondary | ICD-10-CM

## 2018-07-20 MED ORDER — METHOTREXATE SODIUM 5 MG PO TABS: 15.0000 mg | ORAL_TABLET | ORAL | 3 refills | Status: AC

## 2018-07-20 MED ORDER — SULFAMETHOXAZOLE-TRIMETHOPRIM 400-80 MG PO TABS: 1.0000 | ORAL_TABLET | ORAL | 3 refills | Status: DC

## 2018-07-20 NOTE — Progress Notes (Signed)
Subjective:   PATIENT ID: Kimberly Irwin GENDER: female DOB: [birth year], MRN: [id number]   Synopsis: Former patient of Dr. Joya Gaskins and Dr. Corrie Dandy who has sarcoidosis and centrilobular emphysema.  She was diagnosed with WHO group 1 and 5 pulmonary hypertension in 2019. Started treatment for pulmonary hypertension in 2019. Tolerates tadalafil, selexipag, but macitentan caused swelling, headaches, nausea.   HPI  Chief Complaint  Patient presents with  . Follow-up    Pt is having a harder time with pulse O2 when out of the home. Pt is not feeling well nor breathing well today.   Sayra says that prednisone helped.  She says that her family noticed that her dyspnea was improved.  She did have some runny nose and sore throat.  When she on 37m daily it was better.  She has not been sick with bronchitis or pneumonia since the last visit.  She has been out of the house a lot today so she is starting to feel kind of tired and dizzy.  She had an injection in her eye today.  She has been using her concentrator all day today.  She says that she has not been sick recently.  No recent leg swelling.  Past Medical History:  Diagnosis Date  . Bursitis of left hip 10/26/2012  . Chronic low back pain   . Chronic respiratory failure with hypoxia ([facility] 10/21/2016  . Coronary artery calcification 07/03/2016  . DDD (degenerative disc disease)    L3-4 with facet arthropathy and stenosis  . Degenerative spondylolisthesis    L4-5 grade 1 with stenosis  . Emphysema lung ([facility]   . GERD (gastroesophageal reflux disease)   . Hiatal hernia   . Hyperlipidemia   . Hypertension   . Hypothyroidism 10/28/2015  . Pain in joint, lower leg 10/26/2012   left   . Pneumonia    as a child several times.   . Pulmonary hypertension ([facility]   . Sarcoidosis   . Tremors of nervous system         Review of Systems  Constitutional: Negative for chills, diaphoresis and malaise/fatigue.  HENT: Negative for congestion and ear  discharge.   Respiratory: Positive for shortness of breath. Negative for cough, sputum production, wheezing and stridor.   Cardiovascular: Negative for chest pain, palpitations, claudication and leg swelling.      Objective:  Physical Exam   Vitals:   07/20/18 1433  BP: (!) 95/57  Pulse: 76  Weight: 168 lb (76.2 kg)  Height: _0  (1.6 m)   4L Continuously  On pulse O2 5Lpm her O2 saturation was 81%  Gen: chronically ill appearing HENT: OP clear, TM's clear, neck supple PULM: Few crackles bases B, normal percussion CV: RRR, no mgr, trace edema GI: BS+, soft, nontender Derm: no cyanosis or rash Psyche: normal mood and affect    CBC    Component Value Date/Time   WBC 10.2 05/10/2018 1502   RBC 4.97 05/10/2018 1502   HGB 15.0 05/10/2018 1502   HCT 44.1 05/10/2018 1502   PLT 191.0 05/10/2018 1502   MCV 88.6 05/10/2018 1502   MCH 30.1 01/27/2018 0618   MCHC 34.0 05/10/2018 1502   RDW 13.8 05/10/2018 1502   LYMPHSABS 1.2 01/27/2018 0618   MONOABS 1.2 (H) 01/27/2018 0618   EOSABS 0.0 01/27/2018 0618   BASOSABS 0.0 01/27/2018 0618     Chest imaging: CT chest October 2017 showed no PE . Mild emphysematous changes.  Images independently reviewed by  me showing some air trapping and mosaicism.  Some cysts noted in the lower lobes. March 2019 high-resolution CT scan of the chest images independently reviewed, nonspecific interstitial changes in the right greater than left base which are very mild in distribution and overall severity, upper lobe predominant mild to moderate centrilobular emphysema noted, bilateral pulmonary nodules, largest 5 mm, aortic atherosclerosis noted 12/2017 V/Q Scan IMPRESSION:Normal perfusion lung scan. Minimally diminished ventilation to the RIGHT apex.  PFT: PFT (05/2016) Ratio 70% FEV1 1.59 72%, DLCO 35% (flow volume loop consistent with airflow obstruction) PFT (10/2017) ratio 71%, FEV1 1.83 L 84%, FVC 2.43 L 84%, total lung capacity 3.83 L  75%, DLCO 6.15 25%  Labs:  Path:  Echo: 2-D echo on 04/09/2016 showed an EF of 93-73%, grade 1 diastolic dysfunction. Pulmonary artery pressure 44 mmHg  Heart Catheterization: 11/2017 RHC RA mean 2 RV 66/5 PA 66/16, mean 34 PCWP mean 3 Oxygen saturations: PA 60% AO 92% Cardiac Output (Fick) 3.73 Cardiac Index (Fick) 2.04 PVR 8.3 WU  Exhaled NO: FeNO is 20ppm 05/2016   Other: Stress Myoview 2011 neg for ischemia .       Assessment & Plan:   No diagnosis found.  Discussion: Caleb had symptomatic improvement with prednisone at 20 mg daily.  She has not been sick since the last visit.  She is feeling dizzy and unsteady today which is not unexpected considering the fact that she has been using her O2 concentrator all day which we know is an adequate for oxygenation.  She is unwilling to use a tank when she leaves the house.  We talked a lot today about the fact that our major goal for her is decreased symptoms.  Prednisone helped though there is not really a measurable abnormality I can aim treatment towards in terms of sarcoid.  So treatment at this point is predominantly focused on symptom reduction.  Plan: Sarcoidosis: Start taking methotrexate 15 mg weekly.  It is important that you only take these tablets 1 time a week. Take folic acid 1 mg daily, this is to help prevent anemia while taking this medicine Take prednisone 20 mg daily until January then we will decrease the dose to 10 mg daily While taking methotrexate you need to take an antibiotic called Bactrim 3 times a week to prevent an infection It is very important that you come back to see [place] in 2 weeks for what is called a medication reconciliation visit.  Bring all of your medicines for that visit We will monitor blood work to make sure you are tolerating this medicine: CBC and CMET next visit  Pulmonary hypertension: Continue taking Adcirca Continue taking Uptravi  Chronic respiratory failure with hypoxemia: Keep  using 4 L of oxygen continuously  We will see you back in 2 weeks for medication reconciliation visit   Greater than 50% of this 25-minute visit spent face-to-face    Current Outpatient Medications:  .  acetaminophen (TYLENOL) 500 MG tablet, Take 1,000 mg by mouth every 6 (six) hours as needed for moderate pain., Disp: , Rfl:  .  albuterol (PROVENTIL HFA;VENTOLIN HFA) 108 (90 Base) MCG/ACT inhaler, Inhale 2 puffs into the lungs every 6 (six) hours as needed for wheezing or shortness of breath., Disp: 1 Inhaler, Rfl: 6 .  albuterol (PROVENTIL) (2.5 MG/3ML) 0.083% nebulizer solution, Take 3 mLs (2.5 mg total) by nebulization every 6 (six) hours as needed for wheezing or shortness of breath., Disp: 30 mL, Rfl: 4 .  aspirin EC 81  MG tablet, Take 81 mg by mouth at bedtime. , Disp: , Rfl:  .  atenolol (TENORMIN) 50 MG tablet, TAKE 1 TABLET (50 MG TOTAL) BY MOUTH DAILY. (Patient taking differently: Take 50 mg by mouth every morning. ), Disp: 90 tablet, Rfl: 1 .  BESIVANCE 0.6 % SUSP, Place 1 drop into the left eye See admin instructions. INSTILL 1 DROP INTO LEFT 4 TIMES DAILY x 2 DAYS AFTER EYE INJECTION, Disp: , Rfl: 12 .  budesonide-formoterol (SYMBICORT) 160-4.5 MCG/ACT inhaler, Inhale 2 puffs into the lungs 2 (two) times daily., Disp: 1 Inhaler, Rfl: 0 .  calcium carbonate (OSCAL) 1500 (600 Ca) MG TABS tablet, Take 1,200 mg by mouth daily with breakfast., Disp: , Rfl:  .  Cholecalciferol (VITAMIN D) 2000 UNITS tablet, Take 2,000 Units by mouth every evening. , Disp: , Rfl:  .  Cinnamon 500 MG capsule, Take 2,000 mg by mouth daily.  , Disp: , Rfl:  .  citalopram (CELEXA) 10 MG tablet, TAKE 1 TABLET BY MOUTH EVERY DAY, Disp: 90 tablet, Rfl: 1 .  Coenzyme Q10 200 MG capsule, Take 200 mg by mouth at bedtime. , Disp: , Rfl:  .  fexofenadine (ALLEGRA) 180 MG tablet, Take 180 mg by mouth daily., Disp: , Rfl:  .  gabapentin (NEURONTIN) 300 MG capsule, TAKE 1 CAPSULE (300 MG TOTAL) BY MOUTH AT BEDTIME.,  Disp: 90 capsule, Rfl: 2 .  Glucosamine HCl (GLUCOSAMINE PO), Take 1 tablet by mouth every evening. , Disp: , Rfl:  .  hydrochlorothiazide (MICROZIDE) 12.5 MG capsule, Take 1 capsule (12.5 mg total) by mouth daily., Disp: 90 capsule, Rfl: 3 .  ketoconazole (NIZORAL) 2 % cream, Apply 1 application topically daily., Disp: 15 g, Rfl: 0 .  ketoconazole (NIZORAL) 2 % shampoo, Apply 1 application topically 2 (two) times a week. (Patient taking differently: Apply 1 application topically every 14 (fourteen) days. ONCE EVERY 2 WEEKS PER PATIENT), Disp: 120 mL, Rfl: 03 .  levothyroxine (SYNTHROID, LEVOTHROID) 112 MCG tablet, TAKE 1 TABLET BY MOUTH DAILY BEFORE BREAKFAST., Disp: 90 tablet, Rfl: 1 .  nystatin (MYCOSTATIN) 100000 UNIT/ML suspension, Take 5 mLs (500,000 Units total) by mouth 4 (four) times daily., Disp: 60 mL, Rfl: 0 .  Omega-3 Fatty Acids (FISH OIL) 1200 MG CAPS, Take 1,200 mg by mouth daily. , Disp: , Rfl:  .  omeprazole (PRILOSEC) 40 MG capsule, TAKE 1 CAPSULE (40 MG TOTAL) BY MOUTH DAILY., Disp: 90 capsule, Rfl: 1 .  predniSONE (DELTASONE) 10 MG tablet, Take 2 tabs daily for 3 weeks, then 1 tab daily for 3 weeks then stop., Disp: 60 tablet, Rfl: 0 .  rosuvastatin (CRESTOR) 20 MG tablet, TAKE 1 TABLET (20 MG TOTAL) BY MOUTH DAILY., Disp: 90 tablet, Rfl: 2 .  Selexipag (UPTRAVI) 800 MCG TABS, Take 2 tablets (1,600 mcg total) by mouth 2 (two) times daily., Disp: 60 tablet, Rfl: 4 .  Spacer/Aero-Holding Chambers (AEROCHAMBER PLUS WITH MASK) inhaler, Use as instructed, Disp: 1 each, Rfl: 2 .  tadalafil, PAH, (ADCIRCA) 20 MG tablet, Take 2 tablets (40 mg total) by mouth daily., Disp: 60 tablet, Rfl: 11 .  traMADol (ULTRAM) 50 MG tablet, Take 2 tablets (100 mg total) by mouth every 6 (six) hours as needed for moderate pain or severe pain., Disp: 120 tablet, Rfl: 0 .  vitamin B-12 (CYANOCOBALAMIN) 1000 MCG tablet, Take 1,000 mcg by mouth daily., Disp: , Rfl:  .  methotrexate (RHEUMATREX) 5 MG tablet,  Take 3 tablets (15 mg total) by mouth  once a week for 4 days. Caution:Chemotherapy. Protect from light., Disp: 12 tablet, Rfl: 3 .  [START ON 07/21/2018] sulfamethoxazole-trimethoprim (BACTRIM) 400-80 MG tablet, Take 1 tablet by mouth 3 (three) times a week., Disp: 12 tablet, Rfl: 3

## 2018-07-20 NOTE — Patient Instructions (Addendum)
Sarcoidosis: Start taking methotrexate 15 mg weekly.  It is important that you only take these tablets 1 time a week. Take folic acid 1 mg daily, this is to help prevent anemia while taking this medicine Take prednisone 20 mg daily until January then we will decrease the dose to 10 mg daily While taking methotrexate you need to take an antibiotic called Bactrim 3 times a week to prevent an infection It is very important that you come back to see [place] in 2 weeks for what is called a medication reconciliation visit.  Bring all of your medicines for that visit We will monitor blood work to make sure you are tolerating this medicine: CBC and CMET next visit  Pulmonary hypertension: Continue taking Adcirca Continue taking Uptravi  Chronic respiratory failure with hypoxemia: Keep using 4 L of oxygen continuously  We will see you back in 2 weeks for medication reconciliation visit

## 2018-07-22 MED ORDER — PREDNISONE 20 MG PO TABS: 20.0000 mg | ORAL_TABLET | Freq: Every day | ORAL | 0 refills | Status: DC

## 2018-07-22 MED ORDER — FOLIC ACID 1 MG PO TABS: 1.0000 mg | ORAL_TABLET | Freq: Every day | ORAL | 6 refills | Status: DC

## 2018-08-11 ENCOUNTER — Encounter: Payer: Self-pay | Admitting: Adult Health

## 2018-08-11 ENCOUNTER — Other Ambulatory Visit: Payer: Medicare Other

## 2018-08-11 ENCOUNTER — Other Ambulatory Visit (INDEPENDENT_AMBULATORY_CARE_PROVIDER_SITE_OTHER): Payer: Medicare Other

## 2018-08-11 ENCOUNTER — Ambulatory Visit (INDEPENDENT_AMBULATORY_CARE_PROVIDER_SITE_OTHER): Payer: Medicare Other | Admitting: Adult Health

## 2018-08-11 VITALS — BP 125/77 | HR 57 | Ht 63.0 in | Wt 168.0 lb

## 2018-08-11 DIAGNOSIS — J439 Emphysema, unspecified: Secondary | ICD-10-CM | POA: Diagnosis not present

## 2018-08-11 DIAGNOSIS — I272 Pulmonary hypertension, unspecified: Secondary | ICD-10-CM

## 2018-08-11 DIAGNOSIS — D869 Sarcoidosis, unspecified: Secondary | ICD-10-CM

## 2018-08-11 DIAGNOSIS — Z5181 Encounter for therapeutic drug level monitoring: Secondary | ICD-10-CM

## 2018-08-11 DIAGNOSIS — J9611 Chronic respiratory failure with hypoxia: Secondary | ICD-10-CM

## 2018-08-11 NOTE — Assessment & Plan Note (Signed)
Continue on oxygen goal was to keep O2 saturations greater than 88 to 90%

## 2018-08-11 NOTE — Assessment & Plan Note (Signed)
Sarcoidosis steroid dependent. Patient has been started on methotrexate beginning December 2019.  We will try to slowly taper prednisone as able. Labs are pending today.  Patient will continue on Bactrim 3 days a week and daily folic acid  Plan  Patient Instructions  We will call with lab results.  Decrease Prednisone 34m daily for 3 weeks then 523mdaily -hold at this dose   Sarcoidosis: Continue on methotrexate 15 mg weekly.  It is important that you only take these tablets 1 time a week. Take folic acid 1 mg daily, this is to help prevent anemia while taking this medicine While taking methotrexate you need to take an antibiotic called Bactrim 3 times a week to prevent an infection  We will monitor blood work to make sure you are tolerating this medicine: CBC and CMET next visit  Pulmonary hypertension: Continue taking Adcirca Continue taking Uptravi  Chronic respiratory failure with hypoxemia: Keep using 4 L of oxygen continuously  We will see you back in 4 weeks and As needed  [facility]

## 2018-08-11 NOTE — Assessment & Plan Note (Signed)
Appears stable on Symbicort no changes

## 2018-08-11 NOTE — Progress Notes (Signed)
Reviewed, agree 

## 2018-08-11 NOTE — Progress Notes (Signed)
_0  ID: Kimberly Irwin, female    DOB: March 24, 1946, 73 y.o.   MRN: [id number]  Chief Complaint  Patient presents with  . Follow-up    Pulmonary HTN     Referring provider: Mosie Lukes, MD  HPI: 73 year old female former smoker with multiple medical problems.  She is followed for sarcoidosis, emphysema, pulmonary hypertension who group 1 and 5   TEST/EVENTS :  PFT (05/2016) Ratio 70% FEV1 1.59 72%, DLCO 35% (flow volume loop consistent with airflow obstruction) PFT (10/2017) ratio 71%, FEV1 1.83 L 84%, FVC 2.43 L 84%, total lung capacity 3.83 L 75%, DLCO 6.15 25%  Labs:  Path:  Echo: 2-D echo on 04/09/2016 showed an EF of 91-47%, grade 1 diastolic dysfunction. Pulmonary artery pressure 44 mmHg  Heart Catheterization: 11/2017 RHC RA mean 2 RV 66/5 PA 66/16, mean 34 PCWP mean 3 Oxygen saturations: PA 60% AO 92% Cardiac Output (Fick) 3.73 Cardiac Index (Fick) 2.04 PVR 8.3 WU  Exhaled NO: FeNO is 20ppm10/2017  Other: Stress Myoview 2011 neg for ischemia .   08/11/2018 Follow up : Pulmonary hypertension, emphysema, sarcoidosis, oxygen dependent respiratory failure Patient returns for a 3-week follow-up.  Patient has a very complicated medical history. She has underlying emphysema and sarcoidosis that is oxygen dependent. She has been steroid-dependent with prednisone 20 mg.  Last visit patient was started on methotrexate 15 mg weekly in hopes to slowly decrease her steroid dose. Patient returns today stating that she feels that she is tolerating methotrexate without any significant issues.  She is on Bactrim 3 days a week and folic acid daily. She remains on prednisone 20 mg daily.  (Patient was supposed to decrease prednisone to 10 mg last week but misunderstood the instructions) She remains on Symbicort twice daily He denies any flare of cough or wheezing She does do light exercises at home.  No change in her activity tolerance Says that she does feel that she  is slightly better with more good days than bad days.  Patient remains on oxygen 5 L at rest and 6 L with activity.  Says that her oxygen saturations have been good with no increased oxygen demands.  Patient has pulmonary hypertension.  She was started on therapy in 2019.  She has been tolerating tadalafil and selexipag.  Was unable to tolerate macitentan due to swelling headaches and nausea. No change in oxygen demands.  Denies any leg swelling./ Lab work this morning is pending.  Patient is on a very complex medication regimen with more than 20 daily medications.  We went over all of her medication bottles and match them to our [facility].  Patient appears to be doing a very good job with her daily medication routine.    Allergies  Allergen Reactions  . Lipitor [Atorvastatin] Swelling  . Penicillins Swelling    Has patient had a PCN reaction causing immediate rash, facial/tongue/throat swelling, SOB or lightheadedness with hypotension: Yes Has patient had a PCN reaction causing severe rash involving mucus membranes or skin necrosis: No Has patient had a PCN reaction that required hospitalization: No Has patient had a PCN reaction occurring within the last 10 years: No If all of the above answers are "NO", then may proceed with Cephalosporin use.   . Metronidazole Swelling  . Pregabalin Other (See Comments)    REACTION: Somnolence and dizziness  . Simvastatin Other (See Comments)    Leg pain    Immunization History  Administered Date(s) Administered  . Influenza Split 05/08/2011, 05/26/2012  .  Influenza Whole 06/03/2006, 05/31/2007, 04/26/2008, 05/23/2009, 05/20/2010  . Influenza, High Dose Seasonal PF 05/23/2013, 04/03/2016, 06/03/2017, 04/12/2018  . Influenza,inj,Quad PF,6+ Mos 04/27/2014, 06/15/2015  . Pneumococcal Conjugate-13 04/27/2014  . Pneumococcal Polysaccharide-23 06/18/2011, 07/03/2016  . Tdap 06/18/2011    Past Medical History:  Diagnosis Date  . Bursitis of left hip  10/26/2012  . Chronic low back pain   . Chronic respiratory failure with hypoxia ([place]) 10/21/2016  . Coronary artery calcification 07/03/2016  . DDD (degenerative disc disease)    L3-4 with facet arthropathy and stenosis  . Degenerative spondylolisthesis    L4-5 grade 1 with stenosis  . Emphysema lung ([place])   . GERD (gastroesophageal reflux disease)   . Hiatal hernia   . Hyperlipidemia   . Hypertension   . Hypothyroidism 10/28/2015  . Pain in joint, lower leg 10/26/2012   left   . Pneumonia    as a child several times.   . Pulmonary hypertension ([place])   . Sarcoidosis   . Tremors of nervous system     Tobacco History: Social History   Tobacco Use  Smoking Status Former Smoker  . Packs/day: 0.50  . Years: 32.00  . Pack years: 16.00  . Types: Cigarettes  . Last attempt to quit: 07/22/1994  . Years since quitting: 24.0  Smokeless Tobacco Never Used   Counseling given: Not Answered   Outpatient Medications Prior to Visit  Medication Sig Dispense Refill  . acetaminophen (TYLENOL) 500 MG tablet Take 1,000 mg by mouth every 6 (six) hours as needed for moderate pain.    Marland Kitchen albuterol (PROVENTIL HFA;VENTOLIN HFA) 108 (90 Base) MCG/ACT inhaler Inhale 2 puffs into the lungs every 6 (six) hours as needed for wheezing or shortness of breath. 1 Inhaler 6  . albuterol (PROVENTIL) (2.5 MG/3ML) 0.083% nebulizer solution Take 3 mLs (2.5 mg total) by nebulization every 6 (six) hours as needed for wheezing or shortness of breath. 30 mL 4  . aspirin EC 81 MG tablet Take 81 mg by mouth at bedtime.     Marland Kitchen atenolol (TENORMIN) 50 MG tablet TAKE 1 TABLET (50 MG TOTAL) BY MOUTH DAILY. (Patient taking differently: Take 50 mg by mouth every morning. ) 90 tablet 1  . BESIVANCE 0.6 % SUSP Place 1 drop into the left eye See admin instructions. INSTILL 1 DROP INTO LEFT 4 TIMES DAILY x 2 DAYS AFTER EYE INJECTION  12  . budesonide-formoterol (SYMBICORT) 160-4.5 MCG/ACT inhaler Inhale 2 puffs into the lungs 2  (two) times daily. 1 Inhaler 0  . calcium carbonate (OSCAL) 1500 (600 Ca) MG TABS tablet Take 1,200 mg by mouth daily with breakfast.    . Cholecalciferol (VITAMIN D) 2000 UNITS tablet Take 2,000 Units by mouth every evening.     . Cinnamon 500 MG capsule Take 2,000 mg by mouth daily.      . citalopram (CELEXA) 10 MG tablet TAKE 1 TABLET BY MOUTH EVERY DAY 90 tablet 1  . Coenzyme Q10 200 MG capsule Take 200 mg by mouth at bedtime.     . fexofenadine (ALLEGRA) 180 MG tablet Take 180 mg by mouth daily.    . folic acid (FOLVITE) 1 MG tablet Take 1 tablet (1 mg total) by mouth daily. 30 tablet 6  . gabapentin (NEURONTIN) 300 MG capsule TAKE 1 CAPSULE (300 MG TOTAL) BY MOUTH AT BEDTIME. 90 capsule 2  . Glucosamine HCl (GLUCOSAMINE PO) Take 1 tablet by mouth every evening.     . hydrochlorothiazide (MICROZIDE) 12.5 MG capsule  Take 1 capsule (12.5 mg total) by mouth daily. 90 capsule 3  . ketoconazole (NIZORAL) 2 % cream Apply 1 application topically daily. 15 g 0  . ketoconazole (NIZORAL) 2 % shampoo Apply 1 application topically 2 (two) times a week. (Patient taking differently: Apply 1 application topically every 14 (fourteen) days. ONCE EVERY 2 WEEKS PER PATIENT) 120 mL 03  . levothyroxine (SYNTHROID, LEVOTHROID) 112 MCG tablet TAKE 1 TABLET BY MOUTH DAILY BEFORE BREAKFAST. 90 tablet 1  . nystatin (MYCOSTATIN) 100000 UNIT/ML suspension Take 5 mLs (500,000 Units total) by mouth 4 (four) times daily. 60 mL 0  . Omega-3 Fatty Acids (FISH OIL) 1200 MG CAPS Take 1,200 mg by mouth daily.     Marland Kitchen omeprazole (PRILOSEC) 40 MG capsule TAKE 1 CAPSULE (40 MG TOTAL) BY MOUTH DAILY. 90 capsule 1  . predniSONE (DELTASONE) 20 MG tablet Take 1 tablet (20 mg total) by mouth daily with breakfast. 30 tablet 0  . rosuvastatin (CRESTOR) 20 MG tablet TAKE 1 TABLET (20 MG TOTAL) BY MOUTH DAILY. 90 tablet 2  . Selexipag (UPTRAVI) 800 MCG TABS Take 2 tablets (1,600 mcg total) by mouth 2 (two) times daily. 60 tablet 4  .  Spacer/Aero-Holding Chambers (AEROCHAMBER PLUS WITH MASK) inhaler Use as instructed 1 each 2  . sulfamethoxazole-trimethoprim (BACTRIM) 400-80 MG tablet Take 1 tablet by mouth 3 (three) times a week. 12 tablet 3  . tadalafil, PAH, (ADCIRCA) 20 MG tablet Take 2 tablets (40 mg total) by mouth daily. 60 tablet 11  . traMADol (ULTRAM) 50 MG tablet Take 2 tablets (100 mg total) by mouth every 6 (six) hours as needed for moderate pain or severe pain. 120 tablet 0  . vitamin B-12 (CYANOCOBALAMIN) 1000 MCG tablet Take 1,000 mcg by mouth daily.    . predniSONE (DELTASONE) 10 MG tablet Take 2 tabs daily for 3 weeks, then 1 tab daily for 3 weeks then stop. 60 tablet 0   No facility-administered medications prior to visit.      Review of Systems:   Constitutional:   No  weight loss, night sweats,  Fevers, chills,  +fatigue, or  lassitude.  HEENT:   No headaches,  Difficulty swallowing,  Tooth/dental problems, or  Sore throat,                No sneezing, itching, ear ache, positive dry eyes +nasal congestion, post nasal drip,   CV:  No chest pain,  Orthopnea, PND, swelling in lower extremities, anasarca, dizziness, palpitations, syncope.   GI  No heartburn, indigestion, abdominal pain, nausea, vomiting, diarrhea, change in bowel habits, loss of appetite, bloody stools.   Resp:   No chest wall deformity  Skin: no rash or lesions.  GU: no dysuria, change in color of urine, no urgency or frequency.  No flank pain, no hematuria   MS:  No joint pain or swelling.  No decreased range of motion.  No back pain.    Physical Exam  BP 125/77   Pulse (!) 57   Ht _0  (1.6 m)   Wt 168 lb (76.2 kg)   SpO2 97%   BMI 29.76 kg/m   GEN: A/Ox3; pleasant , NAD, elderly, chronically ill-appearing on oxygen   HEENT:  [place]/AT,  EACs-clear, TMs-wnl, NOSE-clear, THROAT-clear, no lesions, no postnasal drip or exudate noted.   NECK:  Supple w/ fair ROM; no JVD; normal carotid impulses w/o bruits; no  thyromegaly or nodules palpated; no lymphadenopathy.    RESP diminished breath sounds in  the bases with no wheezing   no accessory muscle use, no dullness to percussion  CARD:  RRR, no m/r/g, no peripheral edema, pulses intact, no cyanosis or clubbing.  GI:   Soft & nt; nml bowel sounds; no organomegaly or masses detected.   Musco: Warm bil, no deformities or joint swelling noted.   Neuro: alert, no focal deficits noted.    Skin: Warm, no lesions or rashes    Lab Results:  CBC  No results found for: PROBNP  Imaging: No results found.    PFT Results Latest Ref Rng & Units 10/19/2017 05/16/2016  FVC-Pre L 2.40 2.28  FVC-Predicted Pre % 83 77  FVC-Post L 2.43 2.33  FVC-Predicted Post % 84 79  Pre FEV1/FVC % % 71 70  Post FEV1/FCV % % 75 76  FEV1-Pre L 1.70 1.59  FEV1-Predicted Pre % 78 72  FEV1-Post L 1.83 1.76  DLCO UNC% % 25 35  DLCO COR %Predicted % 34 43  TLC L 3.83 3.77  TLC % Predicted % 75 74  RV % Predicted % 60 57    Lab Results  Component Value Date   NITRICOXIDE 20 05/16/2016        Assessment & Plan:   Pulmonary HTN ([place]) Pulmonary hypertension appears to be currently tolerating her current regimen.  No change in activity tolerance oxygen demands.  No extremity swelling on exam. Patient will continue on current regimen.  Lab work is pending today.  Plan  Patient Instructions  We will call with lab results.  Decrease Prednisone 52m daily for 3 weeks then 545mdaily -hold at this dose   Sarcoidosis: Continue on methotrexate 15 mg weekly.  It is important that you only take these tablets 1 time a week. Take folic acid 1 mg daily, this is to help prevent anemia while taking this medicine While taking methotrexate you need to take an antibiotic called Bactrim 3 times a week to prevent an infection  We will monitor blood work to make sure you are tolerating this medicine: CBC and CMET next visit  Pulmonary hypertension: Continue taking  Adcirca Continue taking Uptravi  Chronic respiratory failure with hypoxemia: Keep using 4 L of oxygen continuously  We will see you back in 4 weeks and As needed  [facility]      COPD (chronic obstructive pulmonary disease) ([place] stable on Symbicort no changes  Chronic respiratory failure with hypoxia (HCEdenContinue on oxygen goal was to keep O2 saturations greater than 88 to 90%  Sarcoidosis stage I Sarcoidosis steroid dependent. Patient has been started on methotrexate beginning December 2019.  We will try to slowly taper prednisone as able. Labs are pending today.  Patient will continue on Bactrim 3 days a week and daily folic acid  Plan  Patient Instructions  We will call with lab results.  Decrease Prednisone 1010maily for 3 weeks then 5mg40mily -hold at this dose   Sarcoidosis: Continue on methotrexate 15 mg weekly.  It is important that you only take these tablets 1 time a week. Take folic acid 1 mg daily, this is to help prevent anemia while taking this medicine While taking methotrexate you need to take an antibiotic called Bactrim 3 times a week to prevent an infection  We will monitor blood work to make sure you are tolerating this medicine: CBC and CMET next visit  Pulmonary hypertension: Continue taking Adcirca Continue taking Uptravi  Chronic respiratory failure with  hypoxemia: Keep using 4 L of oxygen continuously  We will see you back in 4 weeks and As needed  In [facility] with Dr. Lutricia Horsfall, NP 08/11/2018

## 2018-08-11 NOTE — Assessment & Plan Note (Signed)
Pulmonary hypertension appears to be currently tolerating her current regimen.  No change in activity tolerance oxygen demands.  No extremity swelling on exam. Patient will continue on current regimen.  Lab work is pending today.  Plan  Patient Instructions  We will call with lab results.  Decrease Prednisone 66m daily for 3 weeks then 562mdaily -hold at this dose   Sarcoidosis: Continue on methotrexate 15 mg weekly.  It is important that you only take these tablets 1 time a week. Take folic acid 1 mg daily, this is to help prevent anemia while taking this medicine While taking methotrexate you need to take an antibiotic called Bactrim 3 times a week to prevent an infection  We will monitor blood work to make sure you are tolerating this medicine: CBC and CMET next visit  Pulmonary hypertension: Continue taking Adcirca Continue taking Uptravi  Chronic respiratory failure with hypoxemia: Keep using 4 L of oxygen continuously  We will see you back in 4 weeks and As needed  [facility] [place] [facility]

## 2018-08-11 NOTE — Patient Instructions (Signed)
We will call with lab results.  Decrease Prednisone 35m daily for 3 weeks then 560mdaily -hold at this dose   Sarcoidosis: Continue on methotrexate 15 mg weekly.  It is important that you only take these tablets 1 time a week. Take folic acid 1 mg daily, this is to help prevent anemia while taking this medicine While taking methotrexate you need to take an antibiotic called Bactrim 3 times a week to prevent an infection  We will monitor blood work to make sure you are tolerating this medicine: CBC and CMET next visit  Pulmonary hypertension: Continue taking Adcirca Continue taking Uptravi  Chronic respiratory failure with hypoxemia: Keep using 4 L of oxygen continuously  We will see you back in 4 weeks and As needed  [facility]

## 2018-08-12 LAB — CBC WITH DIFFERENTIAL/PLATELET
BASOS: 1 %
Basophils Absolute: 0.1 10*3/uL (ref 0.0–0.2)
EOS (ABSOLUTE): 0.2 10*3/uL (ref 0.0–0.4)
EOS: 2 %
HEMOGLOBIN: 13.2 g/dL (ref 11.1–15.9)
Hematocrit: 40.8 % (ref 34.0–46.6)
IMMATURE GRANS (ABS): 0.1 10*3/uL (ref 0.0–0.1)
Immature Granulocytes: 1 %
LYMPHS: 19 %
Lymphocytes Absolute: 2 10*3/uL (ref 0.7–3.1)
MCH: 29.5 pg (ref 26.6–33.0)
MCHC: 32.4 g/dL (ref 31.5–35.7)
MCV: 91 fL (ref 79–97)
MONOCYTES: 9 %
Monocytes Absolute: 1 10*3/uL — ABNORMAL HIGH (ref 0.1–0.9)
NEUTROS ABS: 7.5 10*3/uL — AB (ref 1.4–7.0)
Neutrophils: 68 %
Platelets: 232 10*3/uL (ref 150–450)
RBC: 4.47 x10E6/uL (ref 3.77–5.28)
RDW: 13.8 % (ref 11.7–15.4)
WBC: 10.8 10*3/uL (ref 3.4–10.8)

## 2018-08-12 LAB — COMPREHENSIVE METABOLIC PANEL
A/G RATIO: 1.6 (ref 1.2–2.2)
ALT: 14 IU/L (ref 0–32)
AST: 16 IU/L (ref 0–40)
Albumin: 4.1 g/dL (ref 3.5–4.8)
Alkaline Phosphatase: 49 IU/L (ref 39–117)
BUN/Creatinine Ratio: 14 (ref 12–28)
BUN: 14 mg/dL (ref 8–27)
Bilirubin Total: 0.4 mg/dL (ref 0.0–1.2)
CALCIUM: 9.9 mg/dL (ref 8.7–10.3)
CO2: 24 mmol/L (ref 20–29)
Chloride: 103 mmol/L (ref 96–106)
Creatinine, Ser: 0.99 mg/dL (ref 0.57–1.00)
GFR, EST AFRICAN AMERICAN: 65 mL/min/{1.73_m2} (ref >59)
GFR, EST NON AFRICAN AMERICAN: 56 mL/min/{1.73_m2} — AB (ref >59)
GLUCOSE: 138 mg/dL — AB (ref 65–99)
Globulin, Total: 2.5 g/dL (ref 1.5–4.5)
Potassium: 5 mmol/L (ref 3.5–5.2)
Sodium: 146 mmol/L — ABNORMAL HIGH (ref 134–144)
TOTAL PROTEIN: 6.6 g/dL (ref 6.0–8.5)

## 2018-08-12 NOTE — Progress Notes (Signed)
Called spoke with patient, advised of lab results / recs as stated by TP.  Pt verbalized understanding and denied any questions. 

## 2018-08-24 ENCOUNTER — Ambulatory Visit: Payer: Medicare Other | Admitting: Family Medicine

## 2018-09-02 ENCOUNTER — Ambulatory Visit: Payer: Medicare Other | Admitting: Family Medicine

## 2018-09-07 ENCOUNTER — Ambulatory Visit (INDEPENDENT_AMBULATORY_CARE_PROVIDER_SITE_OTHER): Payer: Medicare Other | Admitting: Pulmonary Disease

## 2018-09-07 ENCOUNTER — Encounter (INDEPENDENT_AMBULATORY_CARE_PROVIDER_SITE_OTHER): Payer: Medicare Other | Admitting: Ophthalmology

## 2018-09-07 ENCOUNTER — Encounter: Payer: Self-pay | Admitting: Pulmonary Disease

## 2018-09-07 VITALS — BP 130/68 | HR 64 | Ht 63.0 in | Wt 173.0 lb

## 2018-09-07 DIAGNOSIS — I272 Pulmonary hypertension, unspecified: Secondary | ICD-10-CM

## 2018-09-07 DIAGNOSIS — H35033 Hypertensive retinopathy, bilateral: Secondary | ICD-10-CM

## 2018-09-07 DIAGNOSIS — J439 Emphysema, unspecified: Secondary | ICD-10-CM | POA: Diagnosis not present

## 2018-09-07 DIAGNOSIS — H34832 Tributary (branch) retinal vein occlusion, left eye, with macular edema: Secondary | ICD-10-CM

## 2018-09-07 DIAGNOSIS — Z5181 Encounter for therapeutic drug level monitoring: Secondary | ICD-10-CM

## 2018-09-07 DIAGNOSIS — J9611 Chronic respiratory failure with hypoxia: Secondary | ICD-10-CM | POA: Diagnosis not present

## 2018-09-07 DIAGNOSIS — J9621 Acute and chronic respiratory failure with hypoxia: Secondary | ICD-10-CM | POA: Diagnosis not present

## 2018-09-07 DIAGNOSIS — D869 Sarcoidosis, unspecified: Secondary | ICD-10-CM | POA: Diagnosis not present

## 2018-09-07 DIAGNOSIS — I10 Essential (primary) hypertension: Secondary | ICD-10-CM | POA: Diagnosis not present

## 2018-09-07 DIAGNOSIS — H43813 Vitreous degeneration, bilateral: Secondary | ICD-10-CM | POA: Diagnosis not present

## 2018-09-07 NOTE — Patient Instructions (Signed)
Pulmonary sarcoidosis: Continue methotrexate 15 mg once a week Continue prednisone 5 mg daily, we will plan on treating you with this drug for another 2 to 3 months then consider stopping Continue Bactrim 3 times a week We will check blood work today: CBC and comprehensive metabolic panel to make sure there is no evidence of underlying toxicity from your medicines  Pulmonary hypertension: Continue taking Adcirca Continue taking Uptravi  Chronic respiratory failure with hypoxemia: Continue using oxygen with a goal to keep your O2 greater than 88%  COPD: Continue Symbicort 2 puffs twice a day no matter how you feel  We will see you back in late March after your most recent echocardiogram

## 2018-09-07 NOTE — Progress Notes (Signed)
Subjective:   PATIENT ID: Kimberly Irwin GENDER: female DOB: Dec 25, 1945, MRN: [id number]   Synopsis: Former patient of Dr. Joya Irwin and Dr. Corrie Irwin who has sarcoidosis and centrilobular emphysema.  She was diagnosed with WHO group 1 and 5 pulmonary hypertension in 2019. Started treatment for pulmonary hypertension in 2019. Tolerates tadalafil, selexipag, but macitentan caused swelling, headaches, nausea.  Started on prednisone and methotrexate for sarcoidosis related pulmonary hypertension and diffuse parenchymal lung disease in December 2019  HPI  Chief Complaint  Patient presents with  . Follow-up    pt states she is doing well, denies any current breathing complaints.     Kimberly Irwin has been having more good days than bad days lately.  She has noticed that her oxygen level has increased since she has been taking her prednisone and methotrexate.  She says that she does struggle with loose stools, jaw pain occasionally, generalized weakness.  She thinks that this is related to her oxygen.  She says that her portable oxygen concentrator still gives her trouble.  For example today just stop working while she was in the waiting room.  Past Medical History:  Diagnosis Date  . Bursitis of left hip 10/26/2012  . Chronic low back pain   . Chronic respiratory failure with hypoxia ([place]) 10/21/2016  . Coronary artery calcification 07/03/2016  . DDD (degenerative disc disease)    L3-4 with facet arthropathy and stenosis  . Degenerative spondylolisthesis    L4-5 grade 1 with stenosis  . Emphysema lung ([place])   . GERD (gastroesophageal reflux disease)   . Hiatal hernia   . Hyperlipidemia   . Hypertension   . Hypothyroidism 10/28/2015  . Pain in joint, lower leg 10/26/2012   left   . Pneumonia    as a child several times.   . Pulmonary hypertension ([place])   . Sarcoidosis   . Tremors of nervous system         Review of Systems  Constitutional: Negative for chills, diaphoresis and  malaise/fatigue.  HENT: Negative for congestion and ear discharge.   Respiratory: Positive for shortness of breath. Negative for cough, sputum production, wheezing and stridor.   Cardiovascular: Negative for chest pain, palpitations, claudication and leg swelling.      Objective:  Physical Exam   Vitals:   09/07/18 1632  BP: 130/68  Pulse: 64  SpO2: 96%  Weight: 173 lb (78.5 kg)  Height: _0  (1.6 m)   4L Continuously  On pulse O2 5Lpm her O2 saturation was 81%  Gen: chronically ill appearing HENT: OP clear, TM's clear, neck supple PULM: Crackles bases B, normal percussion CV: RRR, no mgr, trace edema GI: BS+, soft, nontender Derm: no cyanosis or rash Psyche: normal mood and affect    CBC    Component Value Date/Time   WBC 10.8 08/11/2018 1234   WBC 10.2 05/10/2018 1502   RBC 4.47 08/11/2018 1234   RBC 4.97 05/10/2018 1502   HGB 13.2 08/11/2018 1234   HCT 40.8 08/11/2018 1234   PLT 232 08/11/2018 1234   MCV 91 08/11/2018 1234   MCH 29.5 08/11/2018 1234   MCH 30.1 01/27/2018 0618   MCHC 32.4 08/11/2018 1234   MCHC 34.0 05/10/2018 1502   RDW 13.8 08/11/2018 1234   LYMPHSABS 2.0 08/11/2018 1234   MONOABS 1.2 (H) 01/27/2018 0618   EOSABS 0.2 08/11/2018 1234   BASOSABS 0.1 08/11/2018 1234     Chest imaging: CT chest October 2017 showed no PE .  Mild emphysematous changes.  Images independently reviewed by me showing some air trapping and mosaicism.  Some cysts noted in the lower lobes. March 2019 high-resolution CT scan of the chest images independently reviewed, nonspecific interstitial changes in the right greater than left base which are very mild in distribution and overall severity, upper lobe predominant mild to moderate centrilobular emphysema noted, bilateral pulmonary nodules, largest 5 mm, aortic atherosclerosis noted 12/2017 V/Q Scan IMPRESSION:Normal perfusion lung scan. Minimally diminished ventilation to the RIGHT apex.  PFT: PFT (05/2016) Ratio  70% FEV1 1.59 72%, DLCO 35% (flow volume loop consistent with airflow obstruction) PFT (10/2017) ratio 71%, FEV1 1.83 L 84%, FVC 2.43 L 84%, total lung capacity 3.83 L 75%, DLCO 6.15 25%  Labs:  Path:  Echo: 2-D echo on 04/09/2016 showed an EF of 25-85%, grade 1 diastolic dysfunction. Pulmonary artery pressure 44 mmHg  Heart Catheterization: 11/2017 RHC RA mean 2 RV 66/5 PA 66/16, mean 34 PCWP mean 3 Oxygen saturations: PA 60% AO 92% Cardiac Output (Fick) 3.73 Cardiac Index (Fick) 2.04 PVR 8.3 WU  Exhaled NO: FeNO is 20ppm 05/2016   Other: Stress Myoview 2011 neg for ischemia .  Records from her visit here with Kimberly Irwin in January 2020 reviewed where she was seen for her sarcoidosis, pulmonary hypertension, prednisone was decreased to 5 mg daily she was maintained on methotrexate, Uptravi, and Adcirca.     Assessment & Plan:   Encounter for therapeutic drug monitoring - Plan: CBC w/Diff, Comprehensive Metabolic Panel (CMET)  Acute on chronic respiratory failure with hypoxia ([place]) - Plan: Ambulatory Referral for DME  Therapeutic drug monitoring  Pulmonary HTN ([place])  Pulmonary emphysema, unspecified emphysema type ([place])  Chronic respiratory failure with hypoxia ([place])  Sarcoidosis stage I  Discussion: This has been a stable interval for Kimberly Irwin.  She has had symptomatic improvement since starting on methotrexate and prednisone.  As described previously were mostly just aiming for a symptomatic relief right now.  She asked me today whether or not I think she should go to Kimberly Irwin and at this point I do not think they would add much to her treatment.  Plan: Pulmonary sarcoidosis: Continue methotrexate 15 mg once a week Continue prednisone 5 mg daily, we will plan on treating you with this drug for another 2 to 3 months then consider stopping Continue Bactrim 3 times a week We will check blood work today: CBC and comprehensive metabolic panel to make sure there is no  evidence of underlying toxicity from your medicines  Pulmonary hypertension: Continue taking Adcirca Continue taking Uptravi  Chronic respiratory failure with hypoxemia: Continue using oxygen with a goal to keep your O2 greater than 88%  COPD: Continue Symbicort 2 puffs twice a day no matter how you feel  We will see you back in late March after your most recent echocardiogram    Current Outpatient Medications:  .  acetaminophen (TYLENOL) 500 MG tablet, Take 1,000 mg by mouth every 6 (six) hours as needed for moderate pain., Disp: , Rfl:  .  albuterol (PROVENTIL HFA;VENTOLIN HFA) 108 (90 Base) MCG/ACT inhaler, Inhale 2 puffs into the lungs every 6 (six) hours as needed for wheezing or shortness of breath., Disp: 1 Inhaler, Rfl: 6 .  albuterol (PROVENTIL) (2.5 MG/3ML) 0.083% nebulizer solution, Take 3 mLs (2.5 mg total) by nebulization every 6 (six) hours as needed for wheezing or shortness of breath., Disp: 30 mL, Rfl: 4 .  aspirin EC 81 MG tablet, Take 81 mg by mouth at  bedtime. , Disp: , Rfl:  .  atenolol (TENORMIN) 50 MG tablet, TAKE 1 TABLET (50 MG TOTAL) BY MOUTH DAILY. (Patient taking differently: Take 50 mg by mouth every morning. ), Disp: 90 tablet, Rfl: 1 .  BESIVANCE 0.6 % SUSP, Place 1 drop into the left eye See admin instructions. INSTILL 1 DROP INTO LEFT 4 TIMES DAILY x 2 DAYS AFTER EYE INJECTION, Disp: , Rfl: 12 .  budesonide-formoterol (SYMBICORT) 160-4.5 MCG/ACT inhaler, Inhale 2 puffs into the lungs 2 (two) times daily., Disp: 1 Inhaler, Rfl: 0 .  calcium carbonate (OSCAL) 1500 (600 Ca) MG TABS tablet, Take 1,200 mg by mouth daily with breakfast., Disp: , Rfl:  .  Cholecalciferol (VITAMIN D) 2000 UNITS tablet, Take 2,000 Units by mouth every evening. , Disp: , Rfl:  .  Cinnamon 500 MG capsule, Take 2,000 mg by mouth daily.  , Disp: , Rfl:  .  citalopram (CELEXA) 10 MG tablet, TAKE 1 TABLET BY MOUTH EVERY DAY, Disp: 90 tablet, Rfl: 1 .  Coenzyme Q10 200 MG capsule, Take  200 mg by mouth at bedtime. , Disp: , Rfl:  .  fexofenadine (ALLEGRA) 180 MG tablet, Take 180 mg by mouth daily., Disp: , Rfl:  .  folic acid (FOLVITE) 1 MG tablet, Take 1 tablet (1 mg total) by mouth daily., Disp: 30 tablet, Rfl: 6 .  gabapentin (NEURONTIN) 300 MG capsule, TAKE 1 CAPSULE (300 MG TOTAL) BY MOUTH AT BEDTIME., Disp: 90 capsule, Rfl: 2 .  Glucosamine HCl (GLUCOSAMINE PO), Take 1 tablet by mouth every evening. , Disp: , Rfl:  .  hydrochlorothiazide (MICROZIDE) 12.5 MG capsule, Take 1 capsule (12.5 mg total) by mouth daily., Disp: 90 capsule, Rfl: 3 .  ketoconazole (NIZORAL) 2 % cream, Apply 1 application topically daily., Disp: 15 g, Rfl: 0 .  ketoconazole (NIZORAL) 2 % shampoo, Apply 1 application topically 2 (two) times a week. (Patient taking differently: Apply 1 application topically every 14 (fourteen) days. ONCE EVERY 2 WEEKS PER PATIENT), Disp: 120 mL, Rfl: 03 .  levothyroxine (SYNTHROID, LEVOTHROID) 112 MCG tablet, TAKE 1 TABLET BY MOUTH DAILY BEFORE BREAKFAST., Disp: 90 tablet, Rfl: 1 .  nystatin (MYCOSTATIN) 100000 UNIT/ML suspension, Take 5 mLs (500,000 Units total) by mouth 4 (four) times daily., Disp: 60 mL, Rfl: 0 .  Omega-3 Fatty Acids (FISH OIL) 1200 MG CAPS, Take 1,200 mg by mouth daily. , Disp: , Rfl:  .  omeprazole (PRILOSEC) 40 MG capsule, TAKE 1 CAPSULE (40 MG TOTAL) BY MOUTH DAILY., Disp: 90 capsule, Rfl: 1 .  predniSONE (DELTASONE) 20 MG tablet, Take 1 tablet (20 mg total) by mouth daily with breakfast. (Patient taking differently: Take 10 mg by mouth daily with breakfast. ), Disp: 30 tablet, Rfl: 0 .  rosuvastatin (CRESTOR) 20 MG tablet, TAKE 1 TABLET (20 MG TOTAL) BY MOUTH DAILY., Disp: 90 tablet, Rfl: 2 .  Selexipag (UPTRAVI) 800 MCG TABS, Take 2 tablets (1,600 mcg total) by mouth 2 (two) times daily., Disp: 60 tablet, Rfl: 4 .  Spacer/Aero-Holding Chambers (AEROCHAMBER PLUS WITH MASK) inhaler, Use as instructed, Disp: 1 each, Rfl: 2 .   sulfamethoxazole-trimethoprim (BACTRIM) 400-80 MG tablet, Take 1 tablet by mouth 3 (three) times a week., Disp: 12 tablet, Rfl: 3 .  tadalafil, PAH, (ADCIRCA) 20 MG tablet, Take 2 tablets (40 mg total) by mouth daily., Disp: 60 tablet, Rfl: 11 .  traMADol (ULTRAM) 50 MG tablet, Take 2 tablets (100 mg total) by mouth every 6 (six) hours as needed for  moderate pain or severe pain., Disp: 120 tablet, Rfl: 0 .  vitamin B-12 (CYANOCOBALAMIN) 1000 MCG tablet, Take 1,000 mcg by mouth daily., Disp: , Rfl:

## 2018-09-08 LAB — COMPREHENSIVE METABOLIC PANEL
ALT: 22 IU/L (ref 0–32)
AST: 19 IU/L (ref 0–40)
Albumin/Globulin Ratio: 1.8 (ref 1.2–2.2)
Albumin: 4.1 g/dL (ref 3.7–4.7)
Alkaline Phosphatase: 46 IU/L (ref 39–117)
BUN/Creatinine Ratio: 14 (ref 12–28)
BUN: 17 mg/dL (ref 8–27)
Bilirubin Total: 0.4 mg/dL (ref 0.0–1.2)
CALCIUM: 9.6 mg/dL (ref 8.7–10.3)
CO2: 26 mmol/L (ref 20–29)
CREATININE: 1.22 mg/dL — AB (ref 0.57–1.00)
Chloride: 100 mmol/L (ref 96–106)
GFR calc Af Amer: 50 mL/min/{1.73_m2} — ABNORMAL LOW (ref >59)
GFR, EST NON AFRICAN AMERICAN: 44 mL/min/{1.73_m2} — AB (ref >59)
GLOBULIN, TOTAL: 2.3 g/dL (ref 1.5–4.5)
GLUCOSE: 165 mg/dL — AB (ref 65–99)
Potassium: 4.1 mmol/L (ref 3.5–5.2)
SODIUM: 143 mmol/L (ref 134–144)
Total Protein: 6.4 g/dL (ref 6.0–8.5)

## 2018-09-08 LAB — CBC WITH DIFFERENTIAL/PLATELET
BASOS ABS: 0 10*3/uL (ref 0.0–0.2)
Basos: 1 %
EOS (ABSOLUTE): 0.1 10*3/uL (ref 0.0–0.4)
Eos: 1 %
HEMOGLOBIN: 13.8 g/dL (ref 11.1–15.9)
Hematocrit: 40.3 % (ref 34.0–46.6)
IMMATURE GRANULOCYTES: 1 %
Immature Grans (Abs): 0.1 10*3/uL (ref 0.0–0.1)
Lymphocytes Absolute: 1.9 10*3/uL (ref 0.7–3.1)
Lymphs: 21 %
MCH: 30.4 pg (ref 26.6–33.0)
MCHC: 34.2 g/dL (ref 31.5–35.7)
MCV: 89 fL (ref 79–97)
MONOCYTES: 8 %
Monocytes Absolute: 0.7 10*3/uL (ref 0.1–0.9)
NEUTROS PCT: 68 %
Neutrophils Absolute: 6.1 10*3/uL (ref 1.4–7.0)
Platelets: 155 10*3/uL (ref 150–450)
RBC: 4.54 x10E6/uL (ref 3.77–5.28)
RDW: 14.6 % (ref 11.7–15.4)
WBC: 8.9 10*3/uL (ref 3.4–10.8)

## 2018-09-13 ENCOUNTER — Telehealth: Payer: Self-pay | Admitting: Pulmonary Disease

## 2018-09-13 NOTE — Telephone Encounter (Signed)
I tried to call and speak with Sonia Baller with [facility] but had to leave a message about this humidifer order

## 2018-09-14 ENCOUNTER — Ambulatory Visit (INDEPENDENT_AMBULATORY_CARE_PROVIDER_SITE_OTHER): Payer: Medicare Other | Admitting: Family Medicine

## 2018-09-14 VITALS — BP 124/72 | HR 60 | Temp 98.1°F | Resp 18 | Wt 172.8 lb

## 2018-09-14 DIAGNOSIS — E782 Mixed hyperlipidemia: Secondary | ICD-10-CM

## 2018-09-14 DIAGNOSIS — I10 Essential (primary) hypertension: Secondary | ICD-10-CM | POA: Diagnosis not present

## 2018-09-14 DIAGNOSIS — E039 Hypothyroidism, unspecified: Secondary | ICD-10-CM

## 2018-09-14 DIAGNOSIS — R739 Hyperglycemia, unspecified: Secondary | ICD-10-CM

## 2018-09-14 DIAGNOSIS — R197 Diarrhea, unspecified: Secondary | ICD-10-CM

## 2018-09-14 DIAGNOSIS — J439 Emphysema, unspecified: Secondary | ICD-10-CM | POA: Diagnosis not present

## 2018-09-14 MED ORDER — DIPHENOXYLATE-ATROPINE 2.5-0.025 MG PO TABS: 1.0000 | ORAL_TABLET | Freq: Four times a day (QID) | ORAL | 1 refills | Status: DC | PRN

## 2018-09-14 NOTE — Telephone Encounter (Signed)
I got a message from Jolee Ewing at [facility] and she states that the humidifier was shipped to Kimberly Irwin on 09/08/18 and it was delivered 09/09/18 @ 9:46am. Kimberly Irwin called the patient and ask her to check with the post office to see if the package was there. Kimberly Irwin has ask her daughter to check and see for her. Kimberly Irwin also ask if [facility] was coming out to install the humidifier for her. I told her since they shipped it to her probably not. Waiting to see if the package is at the post office

## 2018-09-14 NOTE — Patient Instructions (Signed)

## 2018-09-15 DIAGNOSIS — R197 Diarrhea, unspecified: Secondary | ICD-10-CM | POA: Insufficient documentation

## 2018-09-15 LAB — COMPREHENSIVE METABOLIC PANEL
ALK PHOS: 39 U/L (ref 39–117)
ALT: 20 U/L (ref 0–35)
AST: 23 U/L (ref 0–37)
Albumin: 4.4 g/dL (ref 3.5–5.2)
BILIRUBIN TOTAL: 0.8 mg/dL (ref 0.2–1.2)
BUN: 16 mg/dL (ref 6–23)
CHLORIDE: 101 meq/L (ref 96–112)
CO2: 27 meq/L (ref 19–32)
CREATININE: 0.99 mg/dL (ref 0.40–1.20)
Calcium: 10 mg/dL (ref 8.4–10.5)
GFR: 54.77 mL/min — AB (ref >60.00)
GLUCOSE: 102 mg/dL — AB (ref 70–99)
POTASSIUM: 4.2 meq/L (ref 3.5–5.1)
SODIUM: 139 meq/L (ref 135–145)
Total Protein: 6.7 g/dL (ref 6.0–8.3)

## 2018-09-15 LAB — LIPID PANEL
CHOL/HDL RATIO: 4
Cholesterol: 179 mg/dL (ref 0–200)
HDL: 45.1 mg/dL (ref >39.00)
LDL Cholesterol: 103 mg/dL — ABNORMAL HIGH (ref 0–99)
NONHDL: 133.89
Triglycerides: 154 mg/dL — ABNORMAL HIGH (ref 0.0–149.0)
VLDL: 30.8 mg/dL (ref 0.0–40.0)

## 2018-09-15 LAB — HEMOGLOBIN A1C: Hgb A1c MFr Bld: 5.4 % (ref 4.6–6.5)

## 2018-09-15 NOTE — Assessment & Plan Note (Signed)
On Levothyroxine, continue to monitor 

## 2018-09-15 NOTE — Assessment & Plan Note (Signed)
Well controlled, no changes to meds. Encouraged heart healthy diet such as the DASH diet and exercise as tolerated.

## 2018-09-15 NOTE — Assessment & Plan Note (Signed)
Encouraged heart healthy diet, increase exercise, avoid trans fats, consider a krill oil cap daily 

## 2018-09-15 NOTE — Assessment & Plan Note (Signed)
Following closely with pulmonology and while she does not like the side effects of her newest meds she is breathing better and more mobile

## 2018-09-15 NOTE — Progress Notes (Signed)
Subjective:    Patient ID: Kimberly Irwin, female    DOB: July 22, 1946, 73 y.o.   MRN: [id number]  No chief complaint on file.   HPI Patient is in today for follow-up accompanied by her daughter.  She is frustrated with her diarrhea at this point.  There is no bloody or mucousy stool but she has 3-5 loose to watery stool daily.  No fevers or chills.  No urinary complaints.  She does correlate it with her new medications for her breathing.  No recent hospitalization or other acute complaint is noted.  She does continue to struggle with fatigue, anhedonia and pain. Denies CP/palp/HA/congestion/fevers/GI or GU c/o. Taking meds as prescribed  Past Medical History:  Diagnosis Date  . Bursitis of left hip 10/26/2012  . Chronic low back pain   . Chronic respiratory failure with hypoxia ([place]) 10/21/2016  . Coronary artery calcification 07/03/2016  . DDD (degenerative disc disease)    L3-4 with facet arthropathy and stenosis  . Degenerative spondylolisthesis    L4-5 grade 1 with stenosis  . Emphysema lung ([place])   . GERD (gastroesophageal reflux disease)   . Hiatal hernia   . Hyperlipidemia   . Hypertension   . Hypothyroidism 10/28/2015  . Pain in joint, lower leg 10/26/2012   left   . Pneumonia    as a child several times.   . Pulmonary hypertension ([place])   . Sarcoidosis   . Tremors of nervous system     Past Surgical History:  Procedure Laterality Date  . ANTERIOR LAT LUMBAR FUSION Left 08/29/2014   Procedure: EXTREME LEFT LATERAL INTERBODY FUSION LUMBAR TWO-THREE LATERAL PLATE;  Surgeon: Charlie Pitter, MD;  Location: [facility];  Service: Neurosurgery;  Laterality: Left;  . BACK SURGERY  01/15/09   L3-4 and L4-5 decompressive laminectomy with bilateral L3, L4, and L5 decompressive foraminotomies, more than it would be required for simple interbody fusion alone.  Marland Kitchen BACK SURGERY  01/15/09   L3-4 and L4-5 posterior lumbar interbody fusion utilizing tanget interbody allograft wedge, Telamon  interbody PEEk cage, and local autografting.  Marland Kitchen BACK SURGERY  01/15/09   L3, L4, and L5 posterolateral arthrodesis using segmental pedicle screw fixation and local autografting.  Marland Kitchen CARDIAC CATHETERIZATION    . CARPAL TUNNEL RELEASE Bilateral   . COLONOSCOPY    . ESOPHAGOGASTRODUODENOSCOPY    . EYE SURGERY     lens implant  . JOINT REPLACEMENT  09/13/09   Right Hip, Dr. Alvan Dame  . KYPHOPLASTY N/A 09/14/2014   Procedure: Lumbar Two Kyphoplasty/Vertebroplasty;  Surgeon: Charlie Pitter, MD;  Location: [facility];  Service: Neurosurgery;  Laterality: N/A;  Lumbar Two Kyphoplasty/Vertebroplasty  . RIGHT HEART CATH N/A 11/19/2017   Procedure: RIGHT HEART CATH;  Surgeon: Larey Dresser, MD;  Location: [facility];  Service: Cardiovascular;  Laterality: N/A;  . THYROIDECTOMY    . TOTAL HIP ARTHROPLASTY Left 06/28/2013   Procedure: LEFT TOTAL  HIP ARTHROPLASTY ANTERIOR APPROACH;  Surgeon: Mauri Pole, MD;  Location: [facility];  Service: Orthopedics;  Laterality: Left;    Family History  Problem Relation Age of Onset  . Cancer Mother 12       Breast  . Heart disease Mother   . Hypertension Mother   . Hyperlipidemia Mother   . Heart attack Mother   . Asthma Maternal Grandmother     Social History   Socioeconomic History  . Marital status: Widowed    Spouse name: Not on  file  . Number of children: 2  . Years of education: Not on file  . Highest education level: Not on file  Occupational History  . Not on file  Social Needs  . Financial resource strain: Not on file  . Food insecurity:    Worry: Not on file    Inability: Not on file  . Transportation needs:    Medical: Not on file    Non-medical: Not on file  Tobacco Use  . Smoking status: Former Smoker    Packs/day: 0.50    Years: 32.00    Pack years: 16.00    Types: Cigarettes    Last attempt to quit: 07/22/1994    Years since quitting: 24.1  . Smokeless tobacco: Never Used  Substance and Sexual Activity  . Alcohol  use: No    Alcohol/week: 0.0 standard drinks  . Drug use: No  . Sexual activity: Not Currently  Lifestyle  . Physical activity:    Days per week: Not on file    Minutes per session: Not on file  . Stress: Not on file  Relationships  . Social connections:    Talks on phone: Not on file    Gets together: Not on file    Attends religious service: Not on file    Active member of club or organization: Not on file    Attends meetings of clubs or organizations: Not on file    Relationship status: Not on file  . Intimate partner violence:    Fear of current or ex partner: Not on file    Emotionally abused: Not on file    Physically abused: Not on file    Forced sexual activity: Not on file  Other Topics Concern  . Not on file  Social History Narrative   Married 52 years and has 2 children (2 daughters)   Alcohol Use - no   Former Smoker - she quit 10 years ago, started when she was 49 and has had varying level of use of tobacco products from 1/2 pack to 3 packs per day.    Outpatient Medications Prior to Visit  Medication Sig Dispense Refill  . acetaminophen (TYLENOL) 500 MG tablet Take 1,000 mg by mouth every 6 (six) hours as needed for moderate pain.    Marland Kitchen albuterol (PROVENTIL HFA;VENTOLIN HFA) 108 (90 Base) MCG/ACT inhaler Inhale 2 puffs into the lungs every 6 (six) hours as needed for wheezing or shortness of breath. 1 Inhaler 6  . albuterol (PROVENTIL) (2.5 MG/3ML) 0.083% nebulizer solution Take 3 mLs (2.5 mg total) by nebulization every 6 (six) hours as needed for wheezing or shortness of breath. 30 mL 4  . aspirin EC 81 MG tablet Take 81 mg by mouth at bedtime.     Marland Kitchen atenolol (TENORMIN) 50 MG tablet TAKE 1 TABLET (50 MG TOTAL) BY MOUTH DAILY. (Patient taking differently: Take 50 mg by mouth every morning. ) 90 tablet 1  . BESIVANCE 0.6 % SUSP Place 1 drop into the left eye See admin instructions. INSTILL 1 DROP INTO LEFT 4 TIMES DAILY x 2 DAYS AFTER EYE INJECTION  12  .  budesonide-formoterol (SYMBICORT) 160-4.5 MCG/ACT inhaler Inhale 2 puffs into the lungs 2 (two) times daily. 1 Inhaler 0  . calcium carbonate (OSCAL) 1500 (600 Ca) MG TABS tablet Take 1,200 mg by mouth daily with breakfast.    . Cholecalciferol (VITAMIN D) 2000 UNITS tablet Take 2,000 Units by mouth every evening.     . Cinnamon 500  MG capsule Take 2,000 mg by mouth daily.      . citalopram (CELEXA) 10 MG tablet TAKE 1 TABLET BY MOUTH EVERY DAY 90 tablet 1  . Coenzyme Q10 200 MG capsule Take 200 mg by mouth at bedtime.     . fexofenadine (ALLEGRA) 180 MG tablet Take 180 mg by mouth daily.    . folic acid (FOLVITE) 1 MG tablet Take 1 tablet (1 mg total) by mouth daily. 30 tablet 6  . gabapentin (NEURONTIN) 300 MG capsule TAKE 1 CAPSULE (300 MG TOTAL) BY MOUTH AT BEDTIME. 90 capsule 2  . Glucosamine HCl (GLUCOSAMINE PO) Take 1 tablet by mouth every evening.     . hydrochlorothiazide (MICROZIDE) 12.5 MG capsule Take 1 capsule (12.5 mg total) by mouth daily. 90 capsule 3  . ketoconazole (NIZORAL) 2 % cream Apply 1 application topically daily. 15 g 0  . ketoconazole (NIZORAL) 2 % shampoo Apply 1 application topically 2 (two) times a week. (Patient taking differently: Apply 1 application topically every 14 (fourteen) days. ONCE EVERY 2 WEEKS PER PATIENT) 120 mL 03  . levothyroxine (SYNTHROID, LEVOTHROID) 112 MCG tablet TAKE 1 TABLET BY MOUTH DAILY BEFORE BREAKFAST. 90 tablet 1  . nystatin (MYCOSTATIN) 100000 UNIT/ML suspension Take 5 mLs (500,000 Units total) by mouth 4 (four) times daily. 60 mL 0  . Omega-3 Fatty Acids (FISH OIL) 1200 MG CAPS Take 1,200 mg by mouth daily.     Marland Kitchen omeprazole (PRILOSEC) 40 MG capsule TAKE 1 CAPSULE (40 MG TOTAL) BY MOUTH DAILY. 90 capsule 1  . predniSONE (DELTASONE) 20 MG tablet Take 1 tablet (20 mg total) by mouth daily with breakfast. (Patient taking differently: Take 10 mg by mouth daily with breakfast. ) 30 tablet 0  . rosuvastatin (CRESTOR) 20 MG tablet TAKE 1 TABLET  (20 MG TOTAL) BY MOUTH DAILY. 90 tablet 2  . Selexipag (UPTRAVI) 800 MCG TABS Take 2 tablets (1,600 mcg total) by mouth 2 (two) times daily. 60 tablet 4  . Spacer/Aero-Holding Chambers (AEROCHAMBER PLUS WITH MASK) inhaler Use as instructed 1 each 2  . sulfamethoxazole-trimethoprim (BACTRIM) 400-80 MG tablet Take 1 tablet by mouth 3 (three) times a week. 12 tablet 3  . tadalafil, PAH, (ADCIRCA) 20 MG tablet Take 2 tablets (40 mg total) by mouth daily. 60 tablet 11  . traMADol (ULTRAM) 50 MG tablet Take 2 tablets (100 mg total) by mouth every 6 (six) hours as needed for moderate pain or severe pain. 120 tablet 0  . TREXALL 5 MG tablet     . vitamin B-12 (CYANOCOBALAMIN) 1000 MCG tablet Take 1,000 mcg by mouth daily.     No facility-administered medications prior to visit.     Allergies  Allergen Reactions  . Lipitor [Atorvastatin] Swelling  . Penicillins Swelling    Has patient had a PCN reaction causing immediate rash, facial/tongue/throat swelling, SOB or lightheadedness with hypotension: Yes Has patient had a PCN reaction causing severe rash involving mucus membranes or skin necrosis: No Has patient had a PCN reaction that required hospitalization: No Has patient had a PCN reaction occurring within the last 10 years: No If all of the above answers are "NO", then may proceed with Cephalosporin use.   . Metronidazole Swelling  . Pregabalin Other (See Comments)    REACTION: Somnolence and dizziness  . Simvastatin Other (See Comments)    Leg pain    Review of Systems  Constitutional: Positive for malaise/fatigue. Negative for fever.  HENT: Negative for congestion.   Eyes:  Negative for blurred vision.  Respiratory: Positive for cough, shortness of breath and wheezing.   Cardiovascular: Negative for chest pain, palpitations and leg swelling.  Gastrointestinal: Negative for abdominal pain, blood in stool and nausea.  Genitourinary: Negative for dysuria and frequency.  Musculoskeletal:  Positive for myalgias. Negative for falls.  Skin: Negative for rash.  Neurological: Negative for dizziness, loss of consciousness and headaches.  Endo/Heme/Allergies: Negative for environmental allergies.  Psychiatric/Behavioral: Positive for depression. The patient is nervous/anxious.        Objective:    Physical Exam Vitals signs and nursing note reviewed.  Constitutional:      General: She is not in acute distress.    Appearance: She is well-developed.  HENT:     Head: Normocephalic and atraumatic.     Nose: Nose normal.  Eyes:     General:        Right eye: No discharge.        Left eye: No discharge.  Neck:     Musculoskeletal: Normal range of motion and neck supple.  Cardiovascular:     Rate and Rhythm: Normal rate and regular rhythm.     Heart sounds: No murmur.  Pulmonary:     Effort: Pulmonary effort is normal.     Comments: Prolonged expiration Abdominal:     General: Bowel sounds are normal.     Palpations: Abdomen is soft.     Tenderness: There is no abdominal tenderness.  Skin:    General: Skin is warm and dry.  Neurological:     Mental Status: She is alert and oriented to person, place, and time.     BP 124/72 (BP Location: Left Arm, Patient Position: Sitting, Cuff Size: Normal)   Pulse 60   Temp 98.1 F (36.7 C) (Oral)   Resp 18   Wt 172 lb 12.8 oz (78.4 kg)   SpO2 96%   BMI 30.61 kg/m  Wt Readings from Last 3 Encounters:  09/14/18 172 lb 12.8 oz (78.4 kg)  09/07/18 173 lb (78.5 kg)  08/11/18 168 lb (76.2 kg)     Lab Results  Component Value Date   WBC 8.9 09/07/2018   HGB 13.8 09/07/2018   HCT 40.3 09/07/2018   PLT 155 09/07/2018   GLUCOSE 102 (H) 09/14/2018   CHOL 179 09/14/2018   TRIG 154.0 (H) 09/14/2018   HDL 45.10 09/14/2018   LDLDIRECT 84.0 03/03/2017   LDLCALC 103 (H) 09/14/2018   ALT 20 09/14/2018   AST 23 09/14/2018   NA 139 09/14/2018   K 4.2 09/14/2018   CL 101 09/14/2018   CREATININE 0.99 09/14/2018   BUN 16  09/14/2018   CO2 27 09/14/2018   TSH 2.12 05/10/2018   INR 1.11 11/19/2017   HGBA1C 5.4 09/14/2018   MICROALBUR 0.2 04/27/2014    Lab Results  Component Value Date   TSH 2.12 05/10/2018   Lab Results  Component Value Date   WBC 8.9 09/07/2018   HGB 13.8 09/07/2018   HCT 40.3 09/07/2018   MCV 89 09/07/2018   PLT 155 09/07/2018   Lab Results  Component Value Date   NA 139 09/14/2018   K 4.2 09/14/2018   CO2 27 09/14/2018   GLUCOSE 102 (H) 09/14/2018   BUN 16 09/14/2018   CREATININE 0.99 09/14/2018   BILITOT 0.8 09/14/2018   ALKPHOS 39 09/14/2018   AST 23 09/14/2018   ALT 20 09/14/2018   PROT 6.7 09/14/2018   ALBUMIN 4.4 09/14/2018   CALCIUM 10.0  09/14/2018   ANIONGAP 9 01/27/2018   GFR 54.77 (L) 09/14/2018   Lab Results  Component Value Date   CHOL 179 09/14/2018   Lab Results  Component Value Date   HDL 45.10 09/14/2018   Lab Results  Component Value Date   LDLCALC 103 (H) 09/14/2018   Lab Results  Component Value Date   TRIG 154.0 (H) 09/14/2018   Lab Results  Component Value Date   CHOLHDL 4 09/14/2018   Lab Results  Component Value Date   HGBA1C 5.4 09/14/2018       Assessment & Plan:   Problem List Items Addressed This Visit    Hyperlipidemia (Chronic)    Encouraged heart healthy diet, increase exercise, avoid trans fats, consider a krill oil cap daily      Relevant Orders   Lipid panel (Completed)   Essential hypertension (Chronic)    Well controlled, no changes to meds. Encouraged heart healthy diet such as the DASH diet and exercise as tolerated.       Hyperglycemia    hgba1c acceptable, minimize simple carbs. Increase exercise as tolerated.       Relevant Orders   Hemoglobin A1c (Completed)   Comprehensive metabolic panel (Completed)   Hypothyroidism    On Levothyroxine, continue to monitor      COPD (chronic obstructive pulmonary disease) ([place])    Following closely with pulmonology and while she does not like the side  effects of her newest meds she is breathing better and more mobile      Diarrhea - Primary    3-5 loose stool daily for over a month. Check stool cultures and if no infection allowed to use Benefiber powder tid and Lomotil prn       Relevant Orders   C. Difficile Toxin   Ova and parasite examination   Stool Culture   Stool, WBC/Lactoferrin      I am having Adalai H. Mccomb start on diphenoxylate-atropine. I am also having her maintain her Cinnamon, Fish Oil, Vitamin D, Coenzyme Q10, aspirin EC, BESIVANCE, Glucosamine HCl (GLUCOSAMINE PO), vitamin B-12, albuterol, albuterol, ketoconazole, atenolol, budesonide-formoterol, gabapentin, calcium carbonate, fexofenadine, acetaminophen, aerochamber plus with mask, nystatin, tadalafil (PAH), hydrochlorothiazide, citalopram, rosuvastatin, ketoconazole, Selexipag, traMADol, omeprazole, levothyroxine, sulfamethoxazole-trimethoprim, folic acid, predniSONE, and TREXALL.  Meds ordered this encounter  Medications  . diphenoxylate-atropine (LOMOTIL) 2.5-0.025 MG tablet    Sig: Take 1 tablet by mouth 4 (four) times daily as needed for diarrhea or loose stools.    Dispense:  30 tablet    Refill:  1     Penni Homans, MD

## 2018-09-15 NOTE — Assessment & Plan Note (Signed)
hgba1c acceptable, minimize simple carbs. Increase exercise as tolerated.  

## 2018-09-15 NOTE — Assessment & Plan Note (Signed)
3-5 loose stool daily for over a month. Check stool cultures and if no infection allowed to use Benefiber powder tid and Lomotil prn

## 2018-09-16 NOTE — Telephone Encounter (Signed)
Called patient, unable to reach left message to give [place] a call back. Rodena Piety please advise if you have any update on this, thank you.

## 2018-09-16 NOTE — Telephone Encounter (Signed)
I just called Kimberly Irwin and she stated that she did get the humidifier and it is hooked up and working

## 2018-09-17 ENCOUNTER — Other Ambulatory Visit: Payer: Medicare Other

## 2018-09-17 DIAGNOSIS — R197 Diarrhea, unspecified: Secondary | ICD-10-CM | POA: Diagnosis not present

## 2018-09-17 LAB — C.DIFFICILE TOXIN: C. DIFFICILE TOXIN A: NOT DETECTED

## 2018-09-20 LAB — FECAL LACTOFERRIN, QUANT
Fecal Lactoferrin: NEGATIVE
MICRO NUMBER:: 197352
SPECIMEN QUALITY:: ADEQUATE

## 2018-09-21 LAB — STOOL CULTURE
MICRO NUMBER: 197266
MICRO NUMBER:: 197267
MICRO NUMBER:: 197268
SHIGA RESULT: NOT DETECTED
SPECIMEN QUALITY:: ADEQUATE
SPECIMEN QUALITY:: ADEQUATE
SPECIMEN QUALITY:: ADEQUATE

## 2018-09-24 LAB — OVA AND PARASITE EXAMINATION
CONCENTRATE RESULT: NONE SEEN
SPECIMEN QUALITY:: ADEQUATE
TRICHROME RESULT: NONE SEEN
VKL: 197269

## 2018-09-27 DIAGNOSIS — Z1212 Encounter for screening for malignant neoplasm of rectum: Secondary | ICD-10-CM | POA: Diagnosis not present

## 2018-09-27 DIAGNOSIS — Z1211 Encounter for screening for malignant neoplasm of colon: Secondary | ICD-10-CM | POA: Diagnosis not present

## 2018-09-28 ENCOUNTER — Telehealth: Payer: Self-pay | Admitting: Pulmonary Disease

## 2018-09-28 MED ORDER — PREDNISONE 5 MG PO TABS: 10.0000 mg | ORAL_TABLET | Freq: Every day | ORAL | 1 refills | Status: DC

## 2018-09-28 NOTE — Telephone Encounter (Signed)
Pulmonary sarcoidosis: Continue methotrexate 15 mg once a week Continue prednisone 5 mg daily, we will plan on treating you with this drug for another 2 to 3 months then consider stopping Continue Bactrim 3 times a week We will check blood work today: CBC and comprehensive metabolic panel to make sure there is no evidence of underlying toxicity from your medicines  Pulmonary hypertension: Continue taking Adcirca Continue taking Uptravi  Chronic respiratory failure with hypoxemia: Continue using oxygen with a goal to keep your O2 greater than 88%  COPD: Continue Symbicort 2 puffs twice a day no matter how you feel  We will see you back in late March after your most recent echocardiogram   Called and spoke with the patient I have sent in the refill for 32m prednisone. Nothing further needed at this time.

## 2018-10-01 ENCOUNTER — Other Ambulatory Visit: Payer: Self-pay

## 2018-10-01 MED ORDER — GABAPENTIN 300 MG PO CAPS: 300.0000 mg | ORAL_CAPSULE | Freq: Every day | ORAL | 2 refills | Status: DC

## 2018-10-04 ENCOUNTER — Telehealth: Payer: Self-pay

## 2018-10-04 DIAGNOSIS — R195 Other fecal abnormalities: Secondary | ICD-10-CM

## 2018-10-04 NOTE — Telephone Encounter (Signed)
Received [facility] lab results for Cologuard results: Positive  Results forwarded to PCP

## 2018-10-04 NOTE — Telephone Encounter (Signed)
Spoke with patient and let her know about results she voiced her understanding  Referral made

## 2018-10-04 NOTE — Telephone Encounter (Signed)
Please let her know it is positive and that we are referring her to gastroenterology for evaluation then place referral fo positive Cologuard/iFob

## 2018-10-06 ENCOUNTER — Telehealth: Payer: Self-pay | Admitting: *Deleted

## 2018-10-06 NOTE — Telephone Encounter (Signed)
Received Cologuard Results; forwarded to provider/SLS 03/04

## 2018-10-18 ENCOUNTER — Ambulatory Visit (HOSPITAL_BASED_OUTPATIENT_CLINIC_OR_DEPARTMENT_OTHER)
Admission: RE | Admit: 2018-10-18 | Discharge: 2018-10-18 | Disposition: A | Discharge status: Home / Self Care | Payer: Medicare Other | Source: Ambulatory Visit | Attending: Cardiology | Admitting: Cardiology

## 2018-10-18 ENCOUNTER — Ambulatory Visit (HOSPITAL_COMMUNITY)
Admission: RE | Admit: 2018-10-18 | Discharge: 2018-10-18 | Disposition: A | Discharge status: Home / Self Care | Payer: Medicare Other | Source: Ambulatory Visit | Attending: Cardiology | Admitting: Cardiology

## 2018-10-18 ENCOUNTER — Encounter (HOSPITAL_COMMUNITY): Payer: Self-pay | Admitting: Cardiology

## 2018-10-18 ENCOUNTER — Other Ambulatory Visit: Payer: Self-pay

## 2018-10-18 VITALS — BP 116/64 | HR 56 | Wt 173.4 lb

## 2018-10-18 DIAGNOSIS — I2510 Atherosclerotic heart disease of native coronary artery without angina pectoris: Secondary | ICD-10-CM | POA: Insufficient documentation

## 2018-10-18 DIAGNOSIS — Z8249 Family history of ischemic heart disease and other diseases of the circulatory system: Secondary | ICD-10-CM | POA: Diagnosis not present

## 2018-10-18 DIAGNOSIS — Z9981 Dependence on supplemental oxygen: Secondary | ICD-10-CM | POA: Diagnosis not present

## 2018-10-18 DIAGNOSIS — Z79890 Hormone replacement therapy (postmenopausal): Secondary | ICD-10-CM | POA: Diagnosis not present

## 2018-10-18 DIAGNOSIS — I272 Pulmonary hypertension, unspecified: Secondary | ICD-10-CM | POA: Diagnosis not present

## 2018-10-18 DIAGNOSIS — Z87891 Personal history of nicotine dependence: Secondary | ICD-10-CM | POA: Diagnosis not present

## 2018-10-18 DIAGNOSIS — D649 Anemia, unspecified: Secondary | ICD-10-CM | POA: Diagnosis not present

## 2018-10-18 DIAGNOSIS — E785 Hyperlipidemia, unspecified: Secondary | ICD-10-CM | POA: Insufficient documentation

## 2018-10-18 DIAGNOSIS — E039 Hypothyroidism, unspecified: Secondary | ICD-10-CM | POA: Diagnosis not present

## 2018-10-18 DIAGNOSIS — J9611 Chronic respiratory failure with hypoxia: Secondary | ICD-10-CM | POA: Diagnosis not present

## 2018-10-18 DIAGNOSIS — J439 Emphysema, unspecified: Secondary | ICD-10-CM | POA: Diagnosis not present

## 2018-10-18 DIAGNOSIS — I5032 Chronic diastolic (congestive) heart failure: Secondary | ICD-10-CM

## 2018-10-18 DIAGNOSIS — I110 Hypertensive heart disease with heart failure: Secondary | ICD-10-CM | POA: Diagnosis not present

## 2018-10-18 DIAGNOSIS — Z7982 Long term (current) use of aspirin: Secondary | ICD-10-CM | POA: Insufficient documentation

## 2018-10-18 DIAGNOSIS — D860 Sarcoidosis of lung: Secondary | ICD-10-CM | POA: Insufficient documentation

## 2018-10-18 DIAGNOSIS — Z79899 Other long term (current) drug therapy: Secondary | ICD-10-CM | POA: Diagnosis not present

## 2018-10-18 LAB — COMPREHENSIVE METABOLIC PANEL
ALK PHOS: 40 U/L (ref 38–126)
ALT: 19 U/L (ref 0–44)
ANION GAP: 11 (ref 5–15)
AST: 23 U/L (ref 15–41)
Albumin: 4 g/dL (ref 3.5–5.0)
BILIRUBIN TOTAL: 0.8 mg/dL (ref 0.3–1.2)
BUN: 16 mg/dL (ref 8–23)
CALCIUM: 10.1 mg/dL (ref 8.9–10.3)
CO2: 26 mmol/L (ref 22–32)
CREATININE: 1.07 mg/dL — AB (ref 0.44–1.00)
Chloride: 102 mmol/L (ref 98–111)
GFR calc non Af Amer: 51 mL/min — ABNORMAL LOW (ref >60)
GFR, EST AFRICAN AMERICAN: 59 mL/min — AB (ref >60)
Glucose, Bld: 116 mg/dL — ABNORMAL HIGH (ref 70–99)
Potassium: 4.6 mmol/L (ref 3.5–5.1)
Sodium: 139 mmol/L (ref 135–145)
TOTAL PROTEIN: 7.1 g/dL (ref 6.5–8.1)

## 2018-10-18 LAB — BRAIN NATRIURETIC PEPTIDE: B Natriuretic Peptide: 112.2 pg/mL — ABNORMAL HIGH (ref 0.0–100.0)

## 2018-10-18 NOTE — Progress Notes (Signed)
*  PRELIMINARY RESULTS* Echocardiogram 2D Echocardiogram has been performed.  Kimberly Irwin 10/18/2018, 1:36 PM

## 2018-10-18 NOTE — Patient Instructions (Signed)
No medication changes today!!  Labs today We will only contact you if something comes back abnormal or we need to make some changes. Otherwise no news is good news!  Your physician recommends that you schedule a follow-up appointment in: 4 months with Dr. Aundra Dubin, you will get a call to schedule this appointment.

## 2018-10-19 NOTE — Progress Notes (Signed)
PCP: Dr. Charlett Blake Pulmonary: Dr. Lake Bells Cardiology: Dr. Aundra Dubin  73 y.o. with history of sarcoidosis and emphysema was referred by Dr Lake Bells for evaluation of pulmonary hypertension.  She has a diagnosis of sarcoidosis, but CT chest in 3/19 did not show marked parenchymal disease.  PFTs showed very low DLCO.  She had RHC done in 4/19 with moderate pulmonary hypertension and very high PVR.    She is now wearing 2 L [place] at all times.  She does not smoke, says she quit > 20 years ago.    Echo in 6/19 showed EF 55-60%, mildly dilated RV with moderately decreased systolic function, PASP 83 mmHg, aortic sclerosis without significant stenosis.   She did not tolerate Opsumit.   Echo was done today and reviewed, EF 55-60% with mild LVH, mild RV dilation with normal systolic function, PASP 31 mmHg.   She returns for f/u of pulmonary HTN.  Wears 4 L O2 at all times.  Weight is up 5 lbs.  She is on Adcirca and selexipag now, really has not felt better on PH meds.  Dr. Lake Bells has had her on prednisone and MTX for sarcoidosis.  She has stable significant dyspnea, short of breath walking across her house.  No lightheadedness or syncope. No chest pain.  No orthopnea/PND.   6 minute walk (5/19): 122 m 6 minute walk (9/19):  41 m (only walked for about 1 minute).   Labs (3/19): LDL 53 Labs (4/19): hgb 8.5, K 4.8, creatinine 1.06 Labs (5/19): anti-SCL70 negative, ANA negative, RF negative, ANCA negative, HIV negative, anti-centromere negative.  Labs (6/19): K 4, creatinine 1.03 Labs (9/19): K 4.6, creatinine 1.18 Labs (10/19): TSH normal, K 4.1, creatinine 1. Labs (2/20): LDL 103, K 4.2, creatinine 0.99  PMH: 1. Sarcoidosis: On home oxygen.  - PFTs (3/19): FEV1 84%, FVC 84%, ratio 71%, TLC 75%, DLCO 25% - CT (3/19) with mild fibrotic changes in the lung bases.  2. Emphysema: Moderate emphysema on CT chest 3/19.  3. CAD: Calcification of coronaries on CT chest in 3/19.  - Cardiolite in 1/19: EF 65%, no  ischemia/infarction.  4. HTN 5. Hyperlipidemia 6. Hypothyroidism 7. Anemia 8. Pulmonary hypertension:  - Echo (9/17): EF 60-65%, PASP 44 mmHg.  - RHC (4/19): mean RA 2, PA 66/16 mean 34, mean PCWP 3, PVR 8.3 WU, CI 2.04 - Autoimmune serologies negative.  - V/Q scan did not show evidence for chronic PE.  - Echo (6/19): EF 55-60%, mildly dilated RV with moderately decreased systolic function, PASP 83 mmHg, aortic sclerosis without significant stenosis.  - Echo (3/20): EF 55-60%, mild LVH, mild RV dilation with normal systolic function, PASP 31 mmHg.   Review of systems complete and found to be negative unless listed in HPI.   Social History   Socioeconomic History  . Marital status: Widowed    Spouse name: Not on file  . Number of children: 2  . Years of education: Not on file  . Highest education level: Not on file  Occupational History  . Not on file  Social Needs  . Financial resource strain: Not on file  . Food insecurity:    Worry: Not on file    Inability: Not on file  . Transportation needs:    Medical: Not on file    Non-medical: Not on file  Tobacco Use  . Smoking status: Former Smoker    Packs/day: 0.50    Years: 32.00    Pack years: 16.00    Types: Cigarettes  Last attempt to quit: 07/22/1994    Years since quitting: 24.2  . Smokeless tobacco: Never Used  Substance and Sexual Activity  . Alcohol use: No    Alcohol/week: 0.0 standard drinks  . Drug use: No  . Sexual activity: Not Currently  Lifestyle  . Physical activity:    Days per week: Not on file    Minutes per session: Not on file  . Stress: Not on file  Relationships  . Social connections:    Talks on phone: Not on file    Gets together: Not on file    Attends religious service: Not on file    Active member of club or organization: Not on file    Attends meetings of clubs or organizations: Not on file    Relationship status: Not on file  . Intimate partner violence:    Fear of current or  ex partner: Not on file    Emotionally abused: Not on file    Physically abused: Not on file    Forced sexual activity: Not on file  Other Topics Concern  . Not on file  Social History Narrative   Married 65 years and has 2 children (2 daughters)   Alcohol Use - no   Former Smoker - she quit 10 years ago, started when she was 52 and has had varying level of use of tobacco products from 1/2 pack to 3 packs per day.   Family History  Problem Relation Age of Onset  . Cancer Mother 83       Breast  . Heart disease Mother   . Hypertension Mother   . Hyperlipidemia Mother   . Heart attack Mother   . Asthma Maternal Grandmother     Current Outpatient Medications  Medication Sig Dispense Refill  . acetaminophen (TYLENOL) 500 MG tablet Take 1,000 mg by mouth every 6 (six) hours as needed for moderate pain.    Marland Kitchen albuterol (PROVENTIL HFA;VENTOLIN HFA) 108 (90 Base) MCG/ACT inhaler Inhale 2 puffs into the lungs every 6 (six) hours as needed for wheezing or shortness of breath. 1 Inhaler 6  . albuterol (PROVENTIL) (2.5 MG/3ML) 0.083% nebulizer solution Take 3 mLs (2.5 mg total) by nebulization every 6 (six) hours as needed for wheezing or shortness of breath. 30 mL 4  . aspirin EC 81 MG tablet Take 81 mg by mouth at bedtime.     Marland Kitchen atenolol (TENORMIN) 50 MG tablet TAKE 1 TABLET (50 MG TOTAL) BY MOUTH DAILY. (Patient taking differently: Take 50 mg by mouth every morning. ) 90 tablet 1  . BESIVANCE 0.6 % SUSP Place 1 drop into the left eye See admin instructions. INSTILL 1 DROP INTO LEFT 4 TIMES DAILY x 2 DAYS AFTER EYE INJECTION  12  . budesonide-formoterol (SYMBICORT) 160-4.5 MCG/ACT inhaler Inhale 2 puffs into the lungs 2 (two) times daily. 1 Inhaler 0  . calcium carbonate (OSCAL) 1500 (600 Ca) MG TABS tablet Take 1,200 mg by mouth daily with breakfast.    . Cholecalciferol (VITAMIN D) 2000 UNITS tablet Take 2,000 Units by mouth every evening.     . Cinnamon 500 MG capsule Take 2,000 mg by mouth  daily.      . citalopram (CELEXA) 10 MG tablet TAKE 1 TABLET BY MOUTH EVERY DAY 90 tablet 1  . Coenzyme Q10 200 MG capsule Take 200 mg by mouth at bedtime.     . diphenoxylate-atropine (LOMOTIL) 2.5-0.025 MG tablet Take 1 tablet by mouth 4 (four) times daily as  needed for diarrhea or loose stools. 30 tablet 1  . fexofenadine (ALLEGRA) 180 MG tablet Take 180 mg by mouth daily.    . folic acid (FOLVITE) 1 MG tablet Take 1 tablet (1 mg total) by mouth daily. 30 tablet 6  . gabapentin (NEURONTIN) 300 MG capsule Take 1 capsule (300 mg total) by mouth at bedtime. 90 capsule 2  . Glucosamine HCl (GLUCOSAMINE PO) Take 1 tablet by mouth every evening.     . hydrochlorothiazide (MICROZIDE) 12.5 MG capsule Take 1 capsule (12.5 mg total) by mouth daily. 90 capsule 3  . ketoconazole (NIZORAL) 2 % cream Apply 1 application topically daily. 15 g 0  . ketoconazole (NIZORAL) 2 % shampoo Apply 1 application topically 2 (two) times a week. (Patient taking differently: Apply 1 application topically every 14 (fourteen) days. ONCE EVERY 2 WEEKS PER PATIENT) 120 mL 03  . levothyroxine (SYNTHROID, LEVOTHROID) 112 MCG tablet TAKE 1 TABLET BY MOUTH DAILY BEFORE BREAKFAST. 90 tablet 1  . nystatin (MYCOSTATIN) 100000 UNIT/ML suspension Take 5 mLs (500,000 Units total) by mouth 4 (four) times daily. 60 mL 0  . Omega-3 Fatty Acids (FISH OIL) 1200 MG CAPS Take 1,200 mg by mouth daily.     Marland Kitchen omeprazole (PRILOSEC) 40 MG capsule TAKE 1 CAPSULE (40 MG TOTAL) BY MOUTH DAILY. 90 capsule 1  . predniSONE (DELTASONE) 20 MG tablet Take 1 tablet (20 mg total) by mouth daily with breakfast. (Patient taking differently: Take 10 mg by mouth daily with breakfast. ) 30 tablet 0  . rosuvastatin (CRESTOR) 20 MG tablet TAKE 1 TABLET (20 MG TOTAL) BY MOUTH DAILY. 90 tablet 2  . Selexipag (UPTRAVI) 800 MCG TABS Take 2 tablets (1,600 mcg total) by mouth 2 (two) times daily. 60 tablet 4  . Spacer/Aero-Holding Chambers (AEROCHAMBER PLUS WITH MASK)  inhaler Use as instructed 1 each 2  . sulfamethoxazole-trimethoprim (BACTRIM) 400-80 MG tablet Take 1 tablet by mouth 3 (three) times a week. 12 tablet 3  . tadalafil, PAH, (ADCIRCA) 20 MG tablet Take 2 tablets (40 mg total) by mouth daily. 60 tablet 11  . traMADol (ULTRAM) 50 MG tablet Take 2 tablets (100 mg total) by mouth every 6 (six) hours as needed for moderate pain or severe pain. 120 tablet 0  . TREXALL 5 MG tablet     . vitamin B-12 (CYANOCOBALAMIN) 1000 MCG tablet Take 1,000 mcg by mouth daily.     No current facility-administered medications for this encounter.    BP 116/64   Pulse (!) 56   Wt 78.7 kg (173 lb 6.4 oz)   SpO2 93%   BMI 30.72 kg/m   Wt Readings from Last 3 Encounters:  10/18/18 78.7 kg (173 lb 6.4 oz)  09/14/18 78.4 kg (172 lb 12.8 oz)  09/07/18 78.5 kg (173 lb)   General: NAD Neck: No JVD, no thyromegaly or thyroid nodule.  Lungs: Clear to auscultation bilaterally with normal respiratory effort. CV: Nondisplaced PMI.  Heart regular S1/S2, no S3/S4, no murmur.  No peripheral edema.  No carotid bruit.  Normal pedal pulses.  Abdomen: Soft, nontender, no hepatosplenomegaly, no distention.  Skin: Intact without lesions or rashes.  Neurologic: Alert and oriented x 3.  Psych: Normal affect. Extremities: No clubbing or cyanosis.  HEENT: Normal.    Assessment/Plan: 1. Pulmonary hypertension: Cath 4/19 with moderate PAH, very high PVR. Right and left heart filling pressures were low.  PFTs in 3/19 showed mild restriction with very low DLCO consistent with pulmonary vascular disease.  V/Q scan with no evidence for chronic PE and autoimmune serologies were negative. High resolution CT in 3/19 showed mild fibrotic changes at the lung bases and a degree of emphysema.  I do not think CT findings can explain her PH.  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  Echo 6/19 showed normal LV EF but mildly dilated/moderately dysfunctional RV with severe pulmonary  hypertension. 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  As parenchymal disease does not appear particularly bad on CT, we have been trying her on pulmonary vasodilators. She did not tolerate Opsumit.  Now on selexipag and Adcirca but without much symptomatic improvement. However, echo done today shows improvement in RV function and fall in PA systolic pressure by noninvasive echo measurement (31 mmHg).   - Continue Adcirca 40 mg daily + Uptravi 1600 mcg BID for now.  Echo parameters are improved though not symptomatically better.  - Check BNP today.  2. Chronic hypoxemic respiratory failure: She has pulmonary sarcoidosis and emphysema.  She is on home oxygen.  Pulmonary hypertension also likely plays a role in low oxygen saturation.   - Continue home oxygen.  - She is on prednisone and MTX.   3. HTN: BP controlled.  - Continue HCTZ 12.5 mg daily and atenolol.  4. Hyperlipidemia: Good lipids 3/19.   Followup in 4 months    Kimberly Irwin, 10/19/2018

## 2018-10-21 ENCOUNTER — Encounter: Payer: Self-pay | Admitting: Pulmonary Disease

## 2018-10-21 ENCOUNTER — Other Ambulatory Visit: Payer: Self-pay

## 2018-10-21 ENCOUNTER — Ambulatory Visit (INDEPENDENT_AMBULATORY_CARE_PROVIDER_SITE_OTHER): Payer: Medicare Other | Admitting: Pulmonary Disease

## 2018-10-21 VITALS — BP 118/63 | HR 62 | Ht 63.0 in | Wt 172.0 lb

## 2018-10-21 DIAGNOSIS — Z5181 Encounter for therapeutic drug level monitoring: Secondary | ICD-10-CM | POA: Diagnosis not present

## 2018-10-21 DIAGNOSIS — I272 Pulmonary hypertension, unspecified: Secondary | ICD-10-CM

## 2018-10-21 DIAGNOSIS — J439 Emphysema, unspecified: Secondary | ICD-10-CM

## 2018-10-21 DIAGNOSIS — D869 Sarcoidosis, unspecified: Secondary | ICD-10-CM | POA: Diagnosis not present

## 2018-10-21 DIAGNOSIS — J9611 Chronic respiratory failure with hypoxia: Secondary | ICD-10-CM | POA: Diagnosis not present

## 2018-10-21 NOTE — Progress Notes (Signed)
Subjective:   PATIENT ID: Kimberly Irwin GENDER: female DOB: Jan 24, 1946, MRN: [id number]   Synopsis: Former patient of Dr. Joya Irwin and Dr. Corrie Irwin who has sarcoidosis and centrilobular emphysema.  She was diagnosed with WHO group 1 and 5 pulmonary hypertension in 2019. Started treatment for pulmonary hypertension in 2019. Tolerates tadalafil, selexipag, but macitentan caused swelling, headaches, nausea.  Started on prednisone and methotrexate for sarcoidosis related pulmonary hypertension and diffuse parenchymal lung disease in December 2019  HPI  Chief Complaint  Patient presents with  . Follow-up    F/U after echo. states her breathing has been the same.    [name] says that her breathing is about the same since the last visit.  She says that her shortness of breath is tolerable, no worse than last time.  She can walk from room to room a little better than she could a year ago at this time.  She does not have leg swelling.  She does not have cough.  She does have ongoing shortness of breath.  She says that her oxygen level will drop when she is walking around at home on 5 L continuous.  But she says her oxygen level will return to normal very quickly, within seconds.  Past Medical History:  Diagnosis Date  . Bursitis of left hip 10/26/2012  . Chronic low back pain   . Chronic respiratory failure with hypoxia ([place]) 10/21/2016  . Coronary artery calcification 07/03/2016  . DDD (degenerative disc disease)    L3-4 with facet arthropathy and stenosis  . Degenerative spondylolisthesis    L4-5 grade 1 with stenosis  . Emphysema lung ([place])   . GERD (gastroesophageal reflux disease)   . Hiatal hernia   . Hyperlipidemia   . Hypertension   . Hypothyroidism 10/28/2015  . Pain in joint, lower leg 10/26/2012   left   . Pneumonia    as a child several times.   . Pulmonary hypertension ([place])   . Sarcoidosis   . Tremors of nervous system         Review of Systems  Constitutional: Negative  for chills, diaphoresis and malaise/fatigue.  HENT: Negative for congestion and ear discharge.   Respiratory: Positive for shortness of breath. Negative for cough, sputum production, wheezing and stridor.   Cardiovascular: Negative for chest pain, palpitations, claudication and leg swelling.      Objective:  Physical Exam   Vitals:   10/21/18 1546  BP: 118/63  Pulse: 62  SpO2: 91%  Weight: 172 lb (78 kg)  Height: _0  (1.6 m)   4L Continuously  On pulse O2 5Lpm her O2 saturation was 81%  Gen: chronically ill appearing HENT: OP clear, TM's clear, neck supple PULM: few crackles bases, otherwise clear, normal percussion CV: RRR, no mgr, trace edema GI: BS+, soft, nontender Derm: no cyanosis or rash Psyche: normal mood and affect     CBC    Component Value Date/Time   WBC 8.9 09/07/2018 1710   WBC 10.2 05/10/2018 1502   RBC 4.54 09/07/2018 1710   RBC 4.97 05/10/2018 1502   HGB 13.8 09/07/2018 1710   HCT 40.3 09/07/2018 1710   PLT 155 09/07/2018 1710   MCV 89 09/07/2018 1710   MCH 30.4 09/07/2018 1710   MCH 30.1 01/27/2018 0618   MCHC 34.2 09/07/2018 1710   MCHC 34.0 05/10/2018 1502   RDW 14.6 09/07/2018 1710   LYMPHSABS 1.9 09/07/2018 1710   MONOABS 1.2 (H) 01/27/2018 8329  EOSABS 0.1 09/07/2018 1710   BASOSABS 0.0 09/07/2018 1710     Chest imaging: CT chest October 2017 showed no PE . Mild emphysematous changes.  Images independently reviewed by me showing some air trapping and mosaicism.  Some cysts noted in the lower lobes. March 2019 high-resolution CT scan of the chest images independently reviewed, nonspecific interstitial changes in the right greater than left base which are very mild in distribution and overall severity, upper lobe predominant mild to moderate centrilobular emphysema noted, bilateral pulmonary nodules, largest 5 mm, aortic atherosclerosis noted 12/2017 V/Q Scan IMPRESSION:Normal perfusion lung scan. Minimally diminished ventilation to  the RIGHT apex.  PFT: PFT (05/2016) Ratio 70% FEV1 1.59 72%, DLCO 35% (flow volume loop consistent with airflow obstruction) PFT (10/2017) ratio 71%, FEV1 1.83 L 84%, FVC 2.43 L 84%, total lung capacity 3.83 L 75%, DLCO 6.15 25%  Labs:  Path:  Echo: 2-D echo on 04/09/2016 showed an EF of 03-50%, grade 1 diastolic dysfunction. Pulmonary artery pressure 44 mmHg March 2020 echocardiogram LVEF 55 to 60%, mild LVH, RVSP 31 mmHg  Heart Catheterization: 11/2017 RHC RA mean 2 RV 66/5 PA 66/16, mean 34 PCWP mean 3 Oxygen saturations: PA 60% AO 92% Cardiac Output (Fick) 3.73 Cardiac Index (Fick) 2.04 PVR 8.3 WU  Exhaled NO: FeNO is 20ppm 05/2016   Other: Stress Myoview 2011 neg for ischemia .  Records from her visit earlier this week with the cardiology advanced heart failure clinic reviewed where she was seen for pulmonary hypertension.  Medications were not adjusted, echocardiogram showed improvement in her right ventricular systolic pressure estimate     Assessment & Plan:   Encounter for therapeutic drug monitoring - Plan: CBC with Differential/Platelet  Therapeutic drug monitoring  Pulmonary HTN ([place])  Pulmonary emphysema, unspecified emphysema type ([place])  Sarcoidosis stage I  Chronic respiratory failure with hypoxia ([facility])  Discussion: In general this is been a stable interval for Kimberly Irwin, she remains quite ill from multiple standpoints from her advanced emphysema, sarcoidosis, and pulmonary hypertension.  Plan: Pulmonary hypertension: Continue taking Uptravi as you are doing Continue taking tadalafil as you are doing This weeks blood work showed no evidence of toxicity from either those medicines  Sarcoidosis: Continue methotrexate weekly Continue prednisone daily Continue Bactrim every Monday Wednesday Friday We will check a complete blood count today to make sure there is no evidence of toxicity from this medicine We will repeat a comprehensive metabolic panel and  a complete blood count in 3 months to make sure that you are doing okay without problems from the medicine  Chronic respiratory failure with hypoxemia: Keep using 5 L of oxygen continuously  Emphysema: Continue taking Symbicort 2 puffs twice a day no matter how you feel  We will see you back in 6 months or sooner if needed    Current Outpatient Medications:  .  acetaminophen (TYLENOL) 500 MG tablet, Take 1,000 mg by mouth every 6 (six) hours as needed for moderate pain., Disp: , Rfl:  .  albuterol (PROVENTIL HFA;VENTOLIN HFA) 108 (90 Base) MCG/ACT inhaler, Inhale 2 puffs into the lungs every 6 (six) hours as needed for wheezing or shortness of breath., Disp: 1 Inhaler, Rfl: 6 .  albuterol (PROVENTIL) (2.5 MG/3ML) 0.083% nebulizer solution, Take 3 mLs (2.5 mg total) by nebulization every 6 (six) hours as needed for wheezing or shortness of breath., Disp: 30 mL, Rfl: 4 .  aspirin EC 81 MG tablet, Take 81 mg by mouth at bedtime. , Disp: , Rfl:  .  atenolol (TENORMIN) 50 MG tablet, TAKE 1 TABLET (50 MG TOTAL) BY MOUTH DAILY. (Patient taking differently: Take 50 mg by mouth every morning. ), Disp: 90 tablet, Rfl: 1 .  BESIVANCE 0.6 % SUSP, Place 1 drop into the left eye See admin instructions. INSTILL 1 DROP INTO LEFT 4 TIMES DAILY x 2 DAYS AFTER EYE INJECTION, Disp: , Rfl: 12 .  budesonide-formoterol (SYMBICORT) 160-4.5 MCG/ACT inhaler, Inhale 2 puffs into the lungs 2 (two) times daily., Disp: 1 Inhaler, Rfl: 0 .  calcium carbonate (OSCAL) 1500 (600 Ca) MG TABS tablet, Take 1,200 mg by mouth daily with breakfast., Disp: , Rfl:  .  Cholecalciferol (VITAMIN D) 2000 UNITS tablet, Take 2,000 Units by mouth every evening. , Disp: , Rfl:  .  Cinnamon 500 MG capsule, Take 2,000 mg by mouth daily.  , Disp: , Rfl:  .  citalopram (CELEXA) 10 MG tablet, TAKE 1 TABLET BY MOUTH EVERY DAY, Disp: 90 tablet, Rfl: 1 .  Coenzyme Q10 200 MG capsule, Take 200 mg by mouth at bedtime. , Disp: , Rfl:  .   diphenoxylate-atropine (LOMOTIL) 2.5-0.025 MG tablet, Take 1 tablet by mouth 4 (four) times daily as needed for diarrhea or loose stools., Disp: 30 tablet, Rfl: 1 .  fexofenadine (ALLEGRA) 180 MG tablet, Take 180 mg by mouth daily., Disp: , Rfl:  .  folic acid (FOLVITE) 1 MG tablet, Take 1 tablet (1 mg total) by mouth daily., Disp: 30 tablet, Rfl: 6 .  gabapentin (NEURONTIN) 300 MG capsule, Take 1 capsule (300 mg total) by mouth at bedtime., Disp: 90 capsule, Rfl: 2 .  Glucosamine HCl (GLUCOSAMINE PO), Take 1 tablet by mouth every evening. , Disp: , Rfl:  .  hydrochlorothiazide (MICROZIDE) 12.5 MG capsule, Take 1 capsule (12.5 mg total) by mouth daily., Disp: 90 capsule, Rfl: 3 .  ketoconazole (NIZORAL) 2 % cream, Apply 1 application topically daily., Disp: 15 g, Rfl: 0 .  ketoconazole (NIZORAL) 2 % shampoo, Apply 1 application topically 2 (two) times a week. (Patient taking differently: Apply 1 application topically every 14 (fourteen) days. ONCE EVERY 2 WEEKS PER PATIENT), Disp: 120 mL, Rfl: 03 .  levothyroxine (SYNTHROID, LEVOTHROID) 112 MCG tablet, TAKE 1 TABLET BY MOUTH DAILY BEFORE BREAKFAST., Disp: 90 tablet, Rfl: 1 .  nystatin (MYCOSTATIN) 100000 UNIT/ML suspension, Take 5 mLs (500,000 Units total) by mouth 4 (four) times daily., Disp: 60 mL, Rfl: 0 .  Omega-3 Fatty Acids (FISH OIL) 1200 MG CAPS, Take 1,200 mg by mouth daily. , Disp: , Rfl:  .  omeprazole (PRILOSEC) 40 MG capsule, TAKE 1 CAPSULE (40 MG TOTAL) BY MOUTH DAILY., Disp: 90 capsule, Rfl: 1 .  predniSONE (DELTASONE) 20 MG tablet, Take 1 tablet (20 mg total) by mouth daily with breakfast. (Patient taking differently: Take 10 mg by mouth daily with breakfast. ), Disp: 30 tablet, Rfl: 0 .  rosuvastatin (CRESTOR) 20 MG tablet, TAKE 1 TABLET (20 MG TOTAL) BY MOUTH DAILY., Disp: 90 tablet, Rfl: 2 .  Selexipag (UPTRAVI) 800 MCG TABS, Take 2 tablets (1,600 mcg total) by mouth 2 (two) times daily., Disp: 60 tablet, Rfl: 4 .   Spacer/Aero-Holding Chambers (AEROCHAMBER PLUS WITH MASK) inhaler, Use as instructed, Disp: 1 each, Rfl: 2 .  sulfamethoxazole-trimethoprim (BACTRIM) 400-80 MG tablet, Take 1 tablet by mouth 3 (three) times a week., Disp: 12 tablet, Rfl: 3 .  tadalafil, PAH, (ADCIRCA) 20 MG tablet, Take 2 tablets (40 mg total) by mouth daily., Disp: 60 tablet, Rfl: 11 .  traMADol (ULTRAM) 50 MG tablet, Take 2 tablets (100 mg total) by mouth every 6 (six) hours as needed for moderate pain or severe pain., Disp: 120 tablet, Rfl: 0 .  TREXALL 5 MG tablet, , Disp: , Rfl:  .  vitamin B-12 (CYANOCOBALAMIN) 1000 MCG tablet, Take 1,000 mcg by mouth daily., Disp: , Rfl:

## 2018-10-21 NOTE — Addendum Note (Signed)
Addended by: Valerie Salts on: 10/21/2018 04:21 PM   Modules accepted: Orders

## 2018-10-21 NOTE — Addendum Note (Signed)
Addended by: Valerie Salts on: 10/21/2018 04:25 PM   Modules accepted: Orders

## 2018-10-21 NOTE — Patient Instructions (Signed)
Pulmonary hypertension: Continue taking Uptravi as you are doing Continue taking tadalafil as you are doing This weeks blood work showed no evidence of toxicity from either those medicines  Sarcoidosis: Continue methotrexate weekly Continue prednisone daily Continue Bactrim every Monday Wednesday Friday We will check a complete blood count today to make sure there is no evidence of toxicity from this medicine We will repeat a comprehensive metabolic panel and a complete blood count in 3 months to make sure that you are doing okay without problems from the medicine  Chronic respiratory failure with hypoxemia: Keep using 5 L of oxygen continuously  Emphysema: Continue taking Symbicort 2 puffs twice a day no matter how you feel  We will see you back in 6 months or sooner if needed

## 2018-10-22 LAB — CBC WITH DIFFERENTIAL/PLATELET
BASOS ABS: 0.1 10*3/uL (ref 0.0–0.2)
Basos: 1 %
EOS (ABSOLUTE): 0.1 10*3/uL (ref 0.0–0.4)
Eos: 1 %
HEMOGLOBIN: 13.4 g/dL (ref 11.1–15.9)
Hematocrit: 37.6 % (ref 34.0–46.6)
Immature Grans (Abs): 0.1 10*3/uL (ref 0.0–0.1)
Immature Granulocytes: 1 %
LYMPHS ABS: 1.5 10*3/uL (ref 0.7–3.1)
Lymphs: 16 %
MCH: 32.8 pg (ref 26.6–33.0)
MCHC: 35.6 g/dL (ref 31.5–35.7)
MCV: 92 fL (ref 79–97)
Monocytes Absolute: 0.7 10*3/uL (ref 0.1–0.9)
Monocytes: 8 %
NEUTROS ABS: 6.8 10*3/uL (ref 1.4–7.0)
Neutrophils: 73 %
Platelets: 182 10*3/uL (ref 150–450)
RBC: 4.09 x10E6/uL (ref 3.77–5.28)
RDW: 14.1 % (ref 11.7–15.4)
WBC: 9.3 10*3/uL (ref 3.4–10.8)

## 2018-10-26 ENCOUNTER — Other Ambulatory Visit: Payer: Self-pay | Admitting: Family Medicine

## 2018-11-02 ENCOUNTER — Encounter (INDEPENDENT_AMBULATORY_CARE_PROVIDER_SITE_OTHER): Payer: Medicare Other | Admitting: Ophthalmology

## 2018-11-04 ENCOUNTER — Encounter: Payer: Self-pay | Admitting: Family Medicine

## 2018-11-05 ENCOUNTER — Other Ambulatory Visit: Payer: Self-pay

## 2018-11-05 MED ORDER — ATENOLOL 50 MG PO TABS: ORAL_TABLET | ORAL | 1 refills | Status: DC

## 2018-11-07 ENCOUNTER — Other Ambulatory Visit: Payer: Self-pay | Admitting: Pulmonary Disease

## 2018-11-29 ENCOUNTER — Telehealth: Payer: Self-pay | Admitting: Pulmonary Disease

## 2018-11-29 NOTE — Telephone Encounter (Signed)
Per [place], recommends trial off prednisone and televisit/OV in 2-4 weeks.  Monitor breathing, if it worsens please call office.

## 2018-12-02 ENCOUNTER — Other Ambulatory Visit: Payer: Self-pay | Admitting: Pulmonary Disease

## 2018-12-13 ENCOUNTER — Telehealth: Payer: Self-pay

## 2018-12-13 ENCOUNTER — Other Ambulatory Visit: Payer: Self-pay | Admitting: Family Medicine

## 2018-12-13 NOTE — Telephone Encounter (Signed)
Copied from [place] [id number]. Topic: General - Other >> Dec 13, 2018 11:56 AM Antonieta Iba C wrote: Reason for CRM: pt called in to request a call back from [facility] to discuss medications.  CB: [phone]

## 2018-12-20 ENCOUNTER — Ambulatory Visit (INDEPENDENT_AMBULATORY_CARE_PROVIDER_SITE_OTHER): Payer: Medicare Other | Admitting: Primary Care

## 2018-12-20 ENCOUNTER — Other Ambulatory Visit: Payer: Self-pay

## 2018-12-20 ENCOUNTER — Encounter: Payer: Self-pay | Admitting: Primary Care

## 2018-12-20 DIAGNOSIS — I272 Pulmonary hypertension, unspecified: Secondary | ICD-10-CM | POA: Diagnosis not present

## 2018-12-20 DIAGNOSIS — D869 Sarcoidosis, unspecified: Secondary | ICD-10-CM | POA: Diagnosis not present

## 2018-12-20 DIAGNOSIS — J449 Chronic obstructive pulmonary disease, unspecified: Secondary | ICD-10-CM

## 2018-12-20 NOTE — Progress Notes (Signed)
Virtual Visit via Telephone Note  I connected with Kimberly Irwin on 12/20/18 at 10:00 AM EDT by telephone and verified that I am speaking with the correct person using two identifiers.  Location: Patient: Home Provider: Office   I discussed the limitations, risks, security and privacy concerns of performing an evaluation and management service by telephone and the availability of in person appointments. I also discussed with the patient that there may be a patient responsible charge related to this service. The patient expressed understanding and agreed to proceed.   History of Present Illness: 73 year old female, former smoker. PMH significant for COPD, sarcoidosis stage 1, chronic respiratory failure, nocturnal hypoxia, sinusitis, GERD, pulmonary hypertension, cardiomegaly, hypothyroidism, obesity. Patient of Dr. Lake Bells, last seen on 10/21/18. Maintained on Uptravi, tadalafil, methotrexate, prednisone for anther 2-3 months, bactrim MWF. Needs repeat CMP in June. Continues 5L oxygen continuously.   12/20/2018 Patient called today for regular follow-up. There was suggestion to trial off prednisone after 2-3 months, however, she is currently on prednisone 61m daily. Reports that she is "doing fine". Experiences dyspnea when she moves. She had to increase her oxygen to 6.5L. Heat makes her breathing worse.  Very little wheezing or cough. States that she was told not to leave the house or have visitors per Dr. MAundra Dubin She has had to cancel all her doctors apts. Denies chest pain. No fever, no sick contact.   Mail po box [phone]   Observations/Objective:  Mild-moderate dyspnea with exertion  Patient reported vital signs: BP 128/70-74 O2 93% 5L at rest O2 89-91% 6.5 on exertion   Assessment and Plan:  Pulmonary hypertension: - Continue Uptravi - Continue tadalafil  COPD: - Continue Symbicort 2 puffs twice daily   Sarcoidosis - Continue prednisone 556mdaily (will discuss with Dr. McLake Bellsabout trial off) - Continue methotrexate weekly - Continue Bactrim MWF - Due for labs in June (currently uable to leave the house)  Chronic respiratory failure: - Continue continuous oxygen 5-6.5 L to keep O2 >88-90%    Follow Up Instructions:  - August with Dr. McLake Bellsr sooner if symptoms worsen   I discussed the assessment and treatment plan with the patient. The patient was provided an opportunity to ask questions and all were answered. The patient agreed with the plan and demonstrated an understanding of the instructions.   The patient was advised to call back or seek an in-person evaluation if the symptoms worsen or if the condition fails to improve as anticipated.  I provided 25 minutes of non-face-to-face time during this encounter.   ElMartyn EhrichNP

## 2018-12-20 NOTE — Patient Instructions (Addendum)
  Pulmonary hypertension: - Continue Uptravi - Continue tadalafil  COPD: - Continue Symbicort 2 puffs twice daily   Sarcoidosis - Continue prednisone 101m daily for now (will discuss with Dr. MLake Bellsabout trial off) - Continue methotrexate weekly - Continue Bactrim MWF - Due for labs in June (if able to have someone come to house to draw labs will set up)  Chronic respiratory failure: - Continue continuous oxygen 5-6.5 L to keep O2 >88-90%   Follow-up: - August with Dr. MLake Bellsor sooner if symptoms worsen

## 2018-12-21 NOTE — Progress Notes (Signed)
Reviewed, discussed, agree Maintain prednisone

## 2018-12-22 ENCOUNTER — Telehealth: Payer: Self-pay

## 2018-12-22 NOTE — Telephone Encounter (Signed)
Pt returning Lisa's phone call

## 2018-12-22 NOTE — Telephone Encounter (Signed)
-----  Message from Martyn Ehrich, NP sent at 12/21/2018  8:48 AM EDT ----- Regarding: RE: ? tapering prednisone for sarcoid Please let patient know to stay on prednisone 23m daily. Thanks ----- Message ----- From: MJuanito Doom MD Sent: 12/21/2018   7:10 AM EDT To: EMartyn Ehrich NP Subject: RE: ? tapering prednisone for sarcoid          Would leave her on prednisone ----- Message ----- From: WMartyn Ehrich NP Sent: 12/20/2018  10:22 AM EDT To: DJuanito Doom MD Subject: ? tapering prednisone for sarcoid              HKaren Chafewith Ms Mcelhaney today for a regular follow-up/ televisit. She is doing fine in her words, but experiences dyspnea on min exertion and increased her oxygen d/t heat. Using 6.5L (89-93%). Your note wanted to trial off prednisone 521mdaily for her sarcoid in May. Wanted to check with you first before I did this d/t patient dyspnea and increased oxygen demand. Denies wheezing, cough or chest pain. States that she is unable to leave the house for apts or to have labs drawn- due in June CMET. May look into nurse going to her house for lab draw if possible.  -beth

## 2018-12-22 NOTE — Telephone Encounter (Signed)
lmtcb x1 pt 

## 2018-12-22 NOTE — Telephone Encounter (Signed)
Called and spoke with Patient.  BQ/EW recommendations given. Understanding stated.  Nothing further at this time.

## 2018-12-28 NOTE — Telephone Encounter (Signed)
Spoke with patient.

## 2019-01-26 ENCOUNTER — Other Ambulatory Visit: Payer: Self-pay | Admitting: Pulmonary Disease

## 2019-02-08 ENCOUNTER — Other Ambulatory Visit: Payer: Self-pay

## 2019-02-08 ENCOUNTER — Encounter (INDEPENDENT_AMBULATORY_CARE_PROVIDER_SITE_OTHER): Payer: Medicare Other | Admitting: Ophthalmology

## 2019-02-08 DIAGNOSIS — H35033 Hypertensive retinopathy, bilateral: Secondary | ICD-10-CM

## 2019-02-08 DIAGNOSIS — H43813 Vitreous degeneration, bilateral: Secondary | ICD-10-CM | POA: Diagnosis not present

## 2019-02-08 DIAGNOSIS — I10 Essential (primary) hypertension: Secondary | ICD-10-CM | POA: Diagnosis not present

## 2019-02-08 DIAGNOSIS — H34832 Tributary (branch) retinal vein occlusion, left eye, with macular edema: Secondary | ICD-10-CM | POA: Diagnosis not present

## 2019-02-15 ENCOUNTER — Other Ambulatory Visit: Payer: Self-pay | Admitting: Pulmonary Disease

## 2019-02-22 ENCOUNTER — Telehealth: Payer: Self-pay | Admitting: Pulmonary Disease

## 2019-02-22 MED ORDER — FOLIC ACID 1 MG PO TABS: 1.0000 mg | ORAL_TABLET | Freq: Every day | ORAL | 6 refills | Status: DC

## 2019-02-22 NOTE — Telephone Encounter (Signed)
Of course that is fine  X 6 refills

## 2019-02-22 NOTE — Telephone Encounter (Signed)
Called & spoke w/ pt to let her know we are sending another prescription for her folic acid with 6 refills to [facility]. Pt verbalized understanding with no additional questions.   Order for folic acid 30 tab x6 refills has been placed. Nothing further needed at this time.

## 2019-02-22 NOTE — Telephone Encounter (Signed)
Called & spoke w/ pt. Pt states she needs a refill on her folic acid (prescribed by BQ 07/23/2019 30 tab x6 refills) because she is taking it in conjunction with her Trexall and sulfamethazole-trimethoprim (Bactrim). Pt last visit was on 12/20/2018 with Beth (televisit).   Current Outpatient Medications on File Prior to Visit  Medication Sig Dispense Refill  . acetaminophen (TYLENOL) 500 MG tablet Take 1,000 mg by mouth every 6 (six) hours as needed for moderate pain.    Marland Kitchen albuterol (PROVENTIL HFA;VENTOLIN HFA) 108 (90 Base) MCG/ACT inhaler Inhale 2 puffs into the lungs every 6 (six) hours as needed for wheezing or shortness of breath. 1 Inhaler 6  . albuterol (PROVENTIL) (2.5 MG/3ML) 0.083% nebulizer solution Take 3 mLs (2.5 mg total) by nebulization every 6 (six) hours as needed for wheezing or shortness of breath. 30 mL 4  . aspirin EC 81 MG tablet Take 81 mg by mouth at bedtime.     Marland Kitchen atenolol (TENORMIN) 25 MG tablet TAKE 1 TABLET (25 MG TOTAL) BY MOUTH AT BEDTIME. TAKE THE 50 MG ATENOLOL IN AM AND THE 25 MG IN PM 90 tablet 3  . atenolol (TENORMIN) 50 MG tablet TAKE 1 TABLET (50 MG TOTAL) BY MOUTH DAILY. 90 tablet 1  . BESIVANCE 0.6 % SUSP Place 1 drop into the left eye See admin instructions. INSTILL 1 DROP INTO LEFT 4 TIMES DAILY x 2 DAYS AFTER EYE INJECTION  12  . budesonide-formoterol (SYMBICORT) 160-4.5 MCG/ACT inhaler Inhale 2 puffs into the lungs 2 (two) times daily. 1 Inhaler 0  . calcium carbonate (OSCAL) 1500 (600 Ca) MG TABS tablet Take 1,200 mg by mouth daily with breakfast.    . Cholecalciferol (VITAMIN D) 2000 UNITS tablet Take 2,000 Units by mouth every evening.     . Cinnamon 500 MG capsule Take 2,000 mg by mouth daily.      . citalopram (CELEXA) 10 MG tablet TAKE 1 TABLET BY MOUTH EVERY DAY 90 tablet 1  . Coenzyme Q10 200 MG capsule Take 200 mg by mouth at bedtime.     . diphenoxylate-atropine (LOMOTIL) 2.5-0.025 MG tablet Take 1 tablet by mouth 4 (four) times daily as needed  for diarrhea or loose stools. 30 tablet 1  . fexofenadine (ALLEGRA) 180 MG tablet Take 180 mg by mouth daily.    . folic acid (FOLVITE) 1 MG tablet Take 1 tablet (1 mg total) by mouth daily. 30 tablet 6  . gabapentin (NEURONTIN) 300 MG capsule Take 1 capsule (300 mg total) by mouth at bedtime. 90 capsule 2  . Glucosamine HCl (GLUCOSAMINE PO) Take 1 tablet by mouth every evening.     . hydrochlorothiazide (MICROZIDE) 12.5 MG capsule Take 1 capsule (12.5 mg total) by mouth daily. 90 capsule 3  . ketoconazole (NIZORAL) 2 % cream Apply 1 application topically daily. 15 g 0  . ketoconazole (NIZORAL) 2 % shampoo Apply 1 application topically 2 (two) times a week. (Patient taking differently: Apply 1 application topically every 14 (fourteen) days. ONCE EVERY 2 WEEKS PER PATIENT) 120 mL 03  . levothyroxine (SYNTHROID) 112 MCG tablet TAKE 1 TABLET BY MOUTH DAILY BEFORE BREAKFAST. 90 tablet 1  . nystatin (MYCOSTATIN) 100000 UNIT/ML suspension Take 5 mLs (500,000 Units total) by mouth 4 (four) times daily. 60 mL 0  . Omega-3 Fatty Acids (FISH OIL) 1200 MG CAPS Take 1,200 mg by mouth daily.     Marland Kitchen omeprazole (PRILOSEC) 40 MG capsule TAKE 1 CAPSULE (40 MG TOTAL) BY MOUTH DAILY.  90 capsule 1  . predniSONE (DELTASONE) 5 MG tablet TAKE 2 TABLETS BY MOUTH DAILY WITH BREAKFAST. 30 tablet 1  . rosuvastatin (CRESTOR) 20 MG tablet TAKE 1 TABLET (20 MG TOTAL) BY MOUTH DAILY. 90 tablet 2  . Selexipag (UPTRAVI) 800 MCG TABS Take 2 tablets (1,600 mcg total) by mouth 2 (two) times daily. 60 tablet 4  . Spacer/Aero-Holding Chambers (AEROCHAMBER PLUS WITH MASK) inhaler Use as instructed 1 each 2  . sulfamethoxazole-trimethoprim (BACTRIM,SEPTRA) 400-80 MG tablet TAKE 1 TABLET BY MOUTH THREE TIMES A WEEK 12 tablet 3  . tadalafil, PAH, (ADCIRCA) 20 MG tablet Take 2 tablets (40 mg total) by mouth daily. 60 tablet 11  . traMADol (ULTRAM) 50 MG tablet Take 2 tablets (100 mg total) by mouth every 6 (six) hours as needed for moderate  pain or severe pain. 120 tablet 0  . TREXALL 5 MG tablet TAKE 3 TABLETS BY MOUTH ONCE A WEEK CAUTION:CHEMOTHERAPY. PROTECT FROM LIGHT. 12 tablet 3  . vitamin B-12 (CYANOCOBALAMIN) 1000 MCG tablet Take 1,000 mcg by mouth daily.     No current facility-administered medications on file prior to visit.    Allergies  Allergen Reactions  . Lipitor [Atorvastatin] Swelling  . Penicillins Swelling    Has patient had a PCN reaction causing immediate rash, facial/tongue/throat swelling, SOB or lightheadedness with hypotension: Yes Has patient had a PCN reaction causing severe rash involving mucus membranes or skin necrosis: No Has patient had a PCN reaction that required hospitalization: No Has patient had a PCN reaction occurring within the last 10 years: No If all of the above answers are "NO", then may proceed with Cephalosporin use.   . Metronidazole Swelling  . Pregabalin Other (See Comments)    REACTION: Somnolence and dizziness  . Simvastatin Other (See Comments)    Leg pain    Since BQ and Beth are not here today, I am routing this message to our provider of the day.  TP, please advise if you authorize the refilling this medication. Thank you.

## 2019-02-25 ENCOUNTER — Other Ambulatory Visit: Payer: Self-pay | Admitting: Pulmonary Disease

## 2019-02-25 ENCOUNTER — Other Ambulatory Visit (HOSPITAL_COMMUNITY): Payer: Self-pay | Admitting: Cardiology

## 2019-03-02 ENCOUNTER — Telehealth: Payer: Self-pay | Admitting: Pulmonary Disease

## 2019-03-02 NOTE — Telephone Encounter (Signed)
Called pt, she states [facility] called her stating they need an ONO order from pulmonary. Called [employer] and spoke with Corene Cornea and was advised that they would need the last OV notes from May 2020 as currently he didn't see any other OV notes on file. May OV notes have been faxed to [facility] at [phone]. He denied anything further regarding pts file. Will sign off.

## 2019-03-09 ENCOUNTER — Telehealth: Payer: Self-pay | Admitting: Family Medicine

## 2019-03-09 NOTE — Telephone Encounter (Signed)
Called patient to schedule AWV, but no answer and no voicemail set up. Will try to call patient at a later time. SF

## 2019-03-11 ENCOUNTER — Encounter (INDEPENDENT_AMBULATORY_CARE_PROVIDER_SITE_OTHER): Payer: Medicare Other | Admitting: Ophthalmology

## 2019-03-11 ENCOUNTER — Other Ambulatory Visit: Payer: Self-pay

## 2019-03-11 DIAGNOSIS — I10 Essential (primary) hypertension: Secondary | ICD-10-CM

## 2019-03-11 DIAGNOSIS — H35033 Hypertensive retinopathy, bilateral: Secondary | ICD-10-CM | POA: Diagnosis not present

## 2019-03-11 DIAGNOSIS — H43813 Vitreous degeneration, bilateral: Secondary | ICD-10-CM

## 2019-03-11 DIAGNOSIS — H34832 Tributary (branch) retinal vein occlusion, left eye, with macular edema: Secondary | ICD-10-CM

## 2019-03-12 IMAGING — CR DG CHEST 2V
2 series · 2 of 2 positions shown · non-contrast
Comparison: Chest x-ray of 09/10/2017

CLINICAL DATA: Increase shortness of breath, COPD, sarcoidosis

EXAM:
CHEST  2 VIEW

[w chest pa]
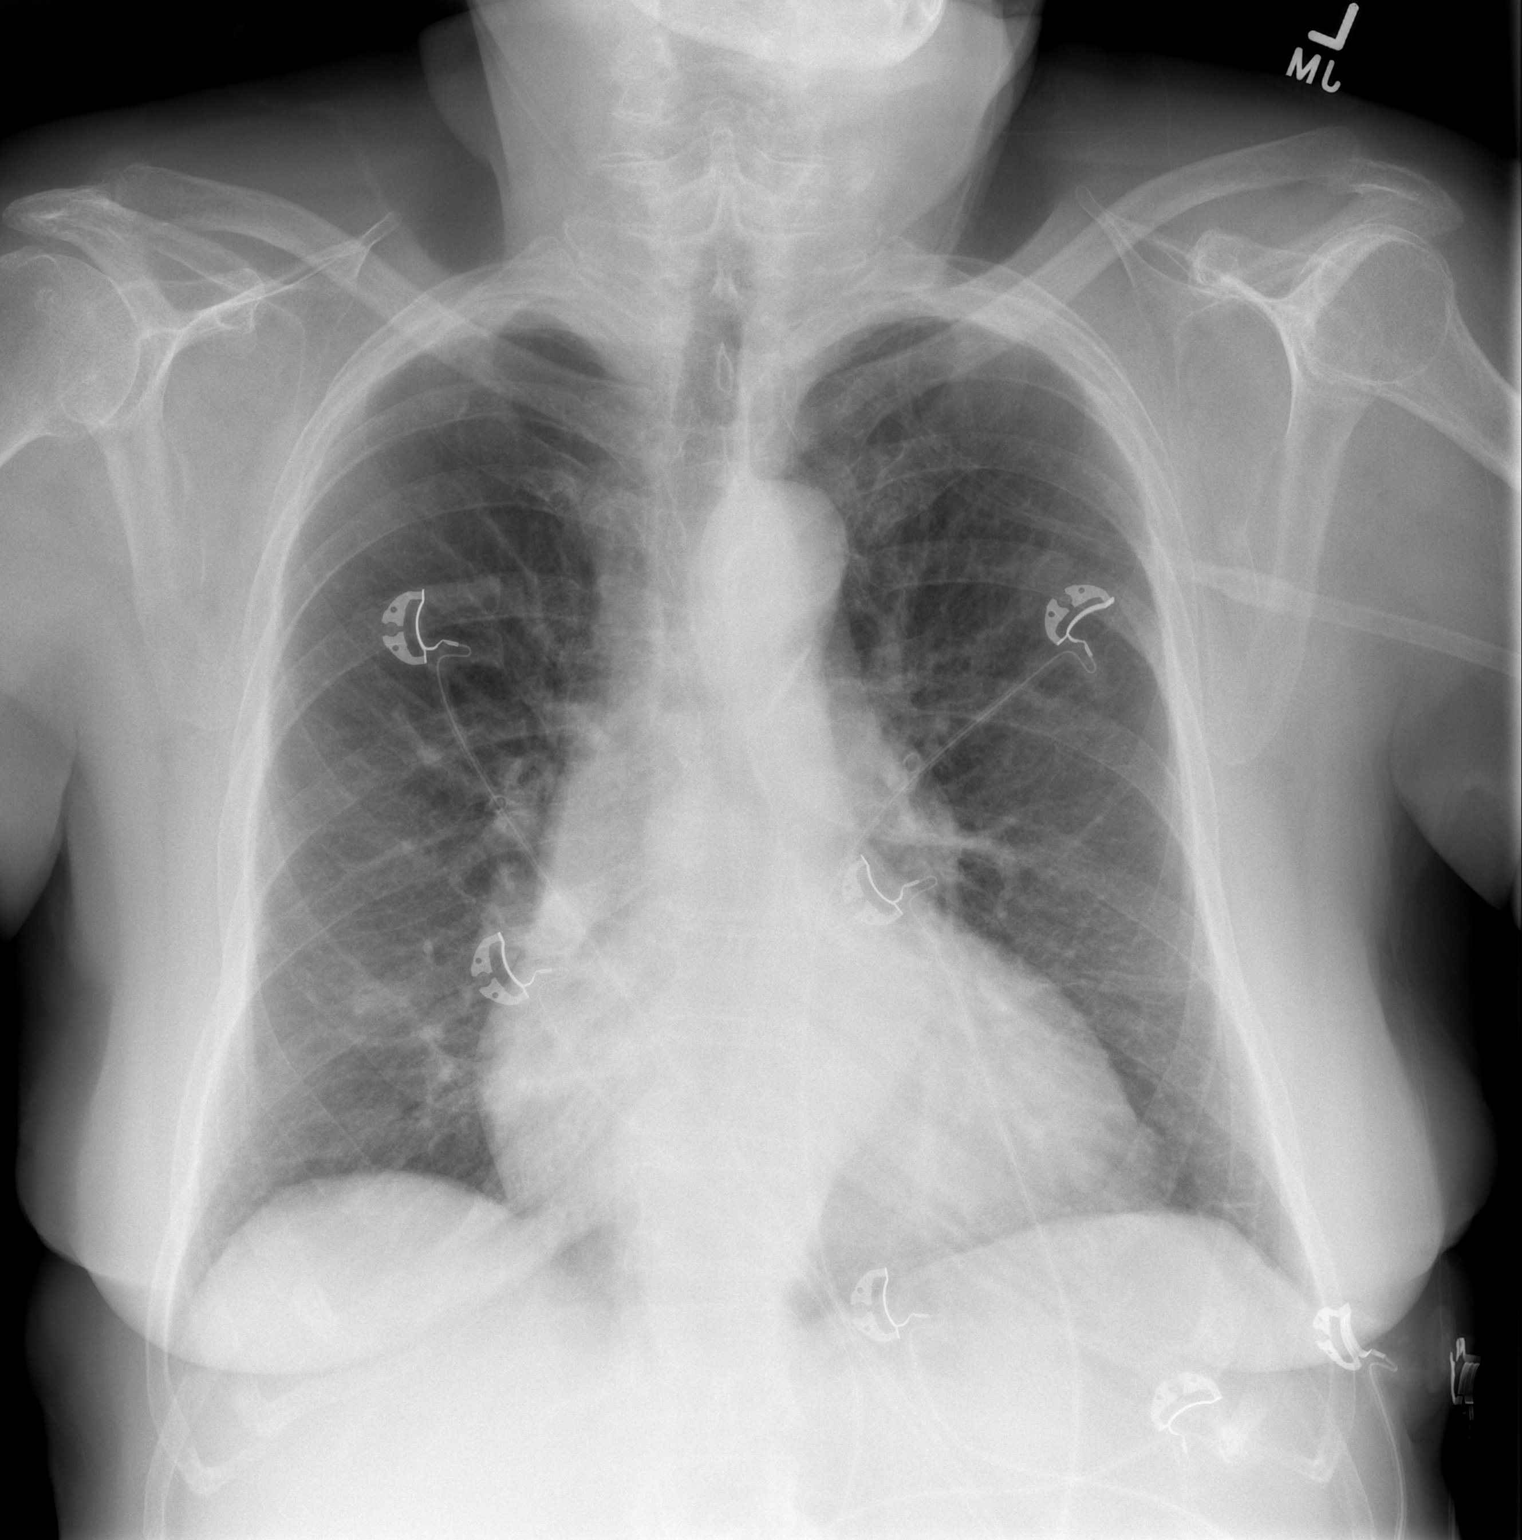

[w chest lat]
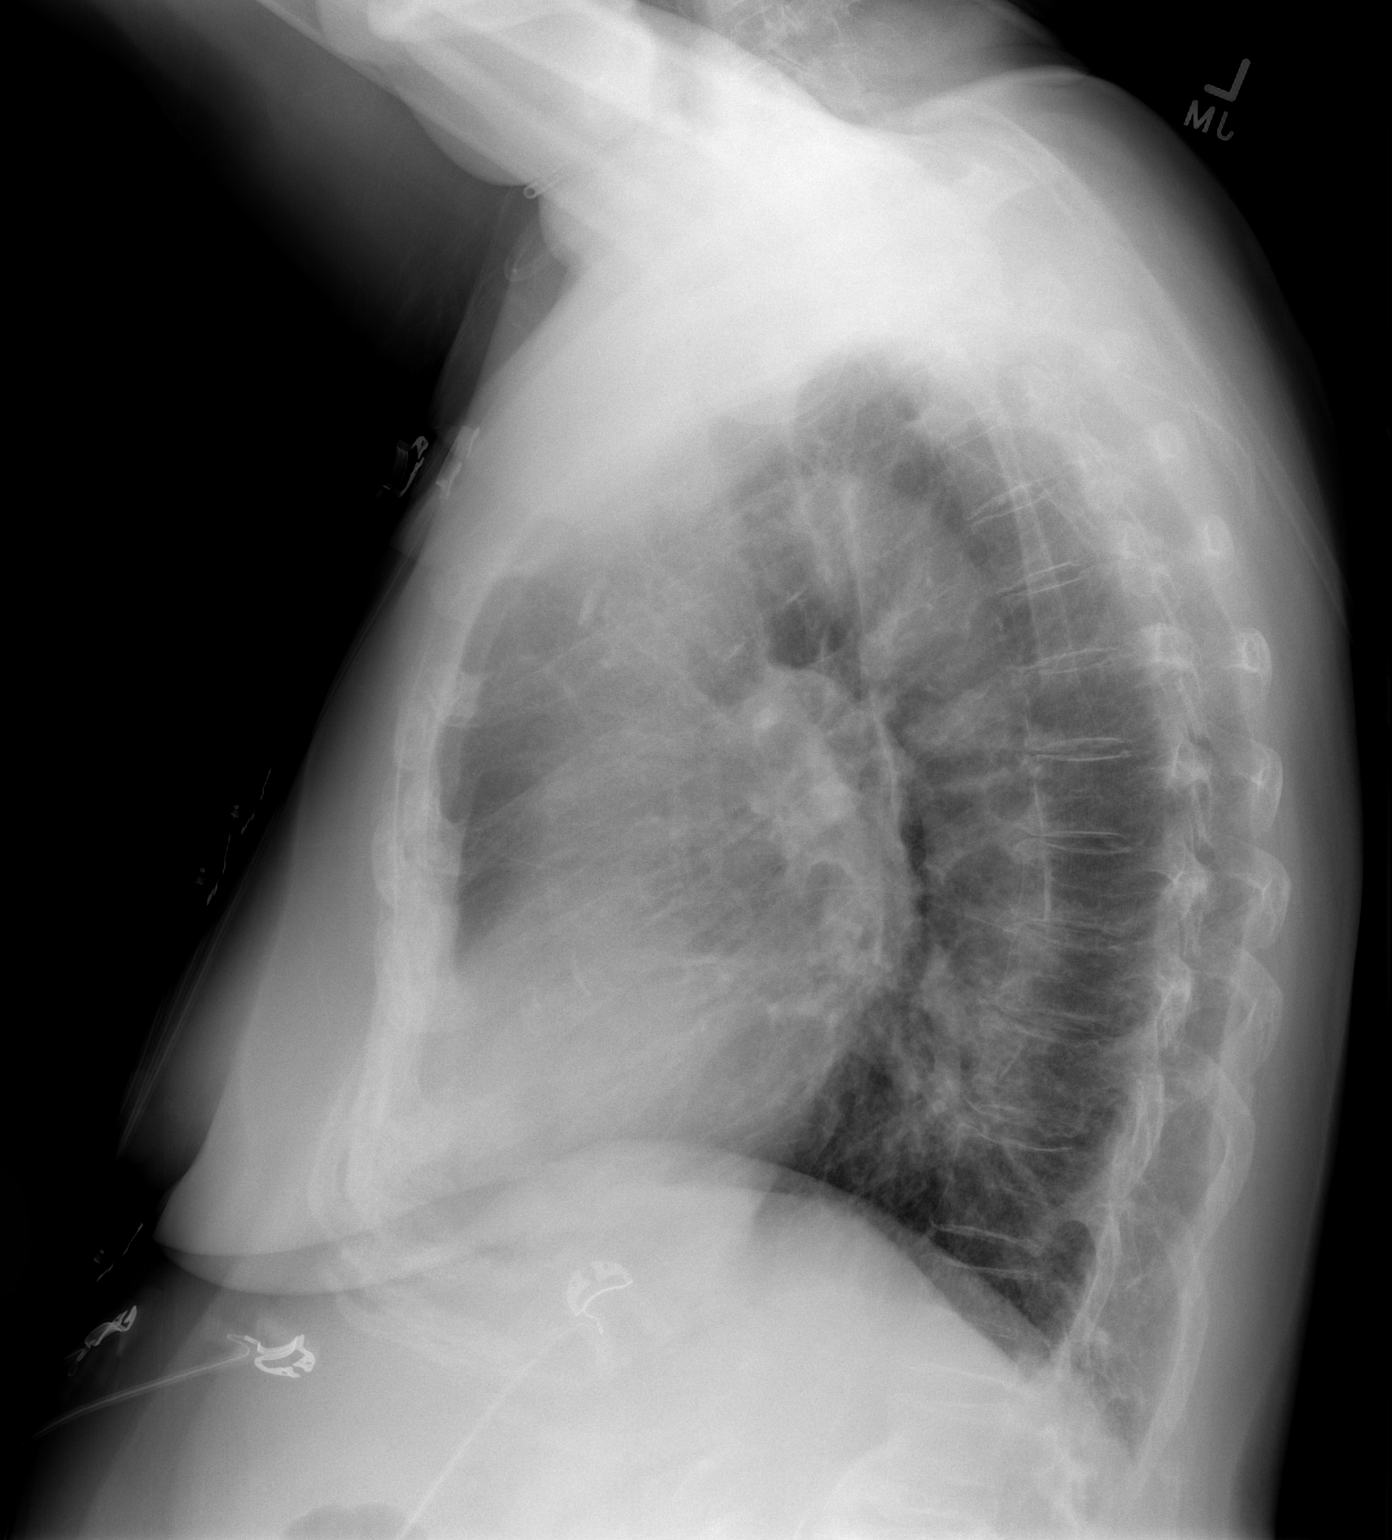

[2 of 2 positions shown; findings below may reference images not displayed]

FINDINGS: No active infiltrate or effusion is seen. Mediastinal and hilar
contours are unremarkable, with no evidence of mediastinal or hilar
adenopathy by chest x-ray. The heart is mildly enlarged and stable.
There are degenerative changes in the mid to lower thoracic spine.
IMPRESSION: 1. No active lung disease.
2. Stable mild cardiomegaly.

## 2019-03-26 ENCOUNTER — Other Ambulatory Visit: Payer: Self-pay | Admitting: Family Medicine

## 2019-04-06 ENCOUNTER — Telehealth: Payer: Self-pay | Admitting: Pulmonary Disease

## 2019-04-06 NOTE — Telephone Encounter (Signed)
No msg needed error.Kimberly Irwin

## 2019-04-14 ENCOUNTER — Other Ambulatory Visit (HOSPITAL_COMMUNITY): Payer: Self-pay | Admitting: Cardiology

## 2019-04-16 ENCOUNTER — Other Ambulatory Visit: Payer: Self-pay | Admitting: Family Medicine

## 2019-04-16 ENCOUNTER — Other Ambulatory Visit: Payer: Self-pay | Admitting: Pulmonary Disease

## 2019-04-20 ENCOUNTER — Other Ambulatory Visit: Payer: Self-pay | Admitting: Family Medicine

## 2019-04-22 ENCOUNTER — Ambulatory Visit: Payer: Medicare Other | Admitting: Critical Care Medicine

## 2019-04-22 ENCOUNTER — Encounter (INDEPENDENT_AMBULATORY_CARE_PROVIDER_SITE_OTHER): Payer: Medicare Other | Admitting: Ophthalmology

## 2019-04-22 ENCOUNTER — Other Ambulatory Visit: Payer: Self-pay

## 2019-04-22 ENCOUNTER — Encounter: Payer: Self-pay | Admitting: Critical Care Medicine

## 2019-04-22 VITALS — BP 116/70 | HR 69 | Temp 98.2°F | Ht 63.5 in | Wt 177.8 lb

## 2019-04-22 DIAGNOSIS — I10 Essential (primary) hypertension: Secondary | ICD-10-CM

## 2019-04-22 DIAGNOSIS — Z23 Encounter for immunization: Secondary | ICD-10-CM | POA: Diagnosis not present

## 2019-04-22 DIAGNOSIS — D869 Sarcoidosis, unspecified: Secondary | ICD-10-CM

## 2019-04-22 DIAGNOSIS — H43813 Vitreous degeneration, bilateral: Secondary | ICD-10-CM

## 2019-04-22 DIAGNOSIS — I272 Pulmonary hypertension, unspecified: Secondary | ICD-10-CM | POA: Diagnosis not present

## 2019-04-22 DIAGNOSIS — J449 Chronic obstructive pulmonary disease, unspecified: Secondary | ICD-10-CM | POA: Diagnosis not present

## 2019-04-22 DIAGNOSIS — J9611 Chronic respiratory failure with hypoxia: Secondary | ICD-10-CM

## 2019-04-22 DIAGNOSIS — R600 Localized edema: Secondary | ICD-10-CM | POA: Diagnosis not present

## 2019-04-22 DIAGNOSIS — H35033 Hypertensive retinopathy, bilateral: Secondary | ICD-10-CM | POA: Diagnosis not present

## 2019-04-22 DIAGNOSIS — H34832 Tributary (branch) retinal vein occlusion, left eye, with macular edema: Secondary | ICD-10-CM

## 2019-04-22 NOTE — Patient Instructions (Addendum)
Thank you for visiting Dr. Carlis Abbott at [facility]. We recommend the following: Orders Placed This Encounter  Procedures  . B Nat Peptide  . COMPLETE METABOLIC PANEL WITH GFR  . CBC w/Diff  . ECHOCARDIOGRAM COMPLETE   Orders Placed This Encounter  Procedures  . B Nat Peptide    Standing Status:   Future    Standing Expiration Date:   04/21/2020  . COMPLETE METABOLIC PANEL WITH GFR    Standing Status:   Future    Standing Expiration Date:   04/21/2020  . CBC w/Diff    Standing Status:   Future    Standing Expiration Date:   04/21/2020  . ECHOCARDIOGRAM COMPLETE    Standing Status:   Future    Standing Expiration Date:   07/21/2020    Order Specific Question:   Where should this test be performed    Answer:   [facility])    Order Specific Question:   Does the patient weigh less than or greater than 250 lbs?    Answer:   Patient weighs less than 250 lbs    Order Specific Question:   Perflutren DEFINITY (image enhancing agent) should be administered unless hypersensitivity or allergy exist    Answer:   Administer Perflutren    Order Specific Question:   Reason for exam-Echo    Answer:   Pulmonary hypertension  416.8 / I27.2      Return in about 3 months (around 07/22/2019).    Please do your part to reduce the spread of COVID-19.

## 2019-04-22 NOTE — Progress Notes (Signed)
Synopsis: Referred in September 2017 for sarcoidosis and COPD.  By Mosie Lukes, MD.  She is a former patient of Dr. Lake Bells.  Subjective:   PATIENT ID: Kimberly Irwin GENDER: female DOB: [birth year], MRN: [id number]  Chief Complaint  Patient presents with   New Patient (Initial Visit)    Former BQ patient. She reports increased SOB. She reports she did not see a difference using the Symbicort so she does not use it daily. Currently on prednisone 32m daily. Using albuterol prn.     Kimberly Irwin is a 73year old woman with a history of COPD, chronic hypoxic respiratory failure, pulmonary artery hypertension, and sarcoidosis.  She is accompanied today by her daughter BHassan Rowan  She was originally diagnosed with sarcoidosis around 2000 by mediastinoscopy.  She did well for several years with stage I disease, not requiring treatment until she developed dyspnea on exertion and presented to pulmonology in 2017.  Her work-up was notable for reduced DLCO on PFTs and pulmonary hypertension on echo.  She underwent right heart catheterization in 2019 and was started on pulmonary vasodilator therapy during the spring/summer 2019.  She has been on home oxygen since 2017.  She is a former smoker, but quit greater than 20 years ago. She stopped her ERA due to side effects; her current regimen is tadalafil 20 mg once daily and selexipag 1600 mcg twice daily.  She also remains on methotrexate 15 mg weekly plus folic acid.  She is on prednisone 5 mg daily..  She remains on Bactrim 3 days weekly.  For her COPD she is maintained on Symbicort.  Since she was last seen she is stopped taking Symbicort because she did not notice that it helped.  She seldom uses albuterol, mostly when she has a cold.  She continues to use 6 L of oxygen at home, but her portable concentrator only goes up to 5 L pulsed dose.  When she walks around at home her saturations typically drop into the 70 to 75% range.  The lowest number she has ever  seen was in the upper 50s, she does not have dizziness, lightheadedness, chest pain, and she has never had syncope.  She denies leg edema or abdominal distention.  Her breathing has gotten worse over time- she has to stop sooner than she used to.  She can walk around in her house, but still takes breaks.  She is not short of breath at rest, but it takes her longer to dress and bathe, both of which cause shortness of breath.  No cough, sputum, wheezing.  She has diarrhea every day and rhinorrhea with occasional nasal congestion due to her PH meds.  When her allergies are bad, she will take oral antihistamines, which sometimes help.  No jaw pain.  No photosensitivity or eye discomfort, no abdominal pain, pruritus, jaundice, or other concerning symptoms.     Per previous notes:  73year old female, former smoker. PMH significant for COPD, sarcoidosis stage 1, chronic respiratory failure, nocturnal hypoxia, sinusitis, GERD, pulmonary hypertension, cardiomegaly, hypothyroidism, obesity. Patient of Dr. MLake Bells last seen on 10/21/18. Maintained on Uptravi, tadalafil, methotrexate, prednisone for anther 2-3 months, bactrim MWF. Needs repeat CMP in June. Continues 5L oxygen continuously.    12/20/2018 Patient called today for regular follow-up. There was suggestion to trial off prednisone after 2-3 months, however, she is currently on prednisone 566mdaily. Reports that she is "doing fine". Experiences dyspnea when she moves. She had to increase her oxygen to 6.5L. Heat  makes her breathing worse.  Very little wheezing or cough. States that she was told not to leave the house or have visitors per Dr. Aundra Dubin. She has had to cancel all her doctors apts. Denies chest pain. No fever, no sick contact.       Past Medical History:  Diagnosis Date   Bursitis of left hip 10/26/2012   Chronic low back pain    Chronic respiratory failure with hypoxia (HCC) 10/21/2016   Coronary artery calcification 07/03/2016    DDD (degenerative disc disease)    L3-4 with facet arthropathy and stenosis   Degenerative spondylolisthesis    L4-5 grade 1 with stenosis   Emphysema lung (HCC)    GERD (gastroesophageal reflux disease)    Hiatal hernia    Hyperlipidemia    Hypertension    Hypothyroidism 10/28/2015   Pain in joint, lower leg 10/26/2012   left    Pneumonia    as a child several times.    Pulmonary hypertension ([place])    Sarcoidosis    Tremors of nervous system      Family History  Problem Relation Age of Onset   Cancer Mother 59       Breast   Heart disease Mother    Hypertension Mother    Hyperlipidemia Mother    Heart attack Mother    Asthma Maternal Grandmother      Past Surgical History:  Procedure Laterality Date   ANTERIOR LAT LUMBAR FUSION Left 08/29/2014   Procedure: EXTREME LEFT LATERAL INTERBODY FUSION LUMBAR TWO-THREE LATERAL PLATE;  Surgeon: Charlie Pitter, MD;  Location: [facility];  Service: Neurosurgery;  Laterality: Left;   BACK SURGERY  01/15/09   L3-4 and L4-5 decompressive laminectomy with bilateral L3, L4, and L5 decompressive foraminotomies, more than it would be required for simple interbody fusion alone.   BACK SURGERY  01/15/09   L3-4 and L4-5 posterior lumbar interbody fusion utilizing tanget interbody allograft wedge, Telamon interbody PEEk cage, and local autografting.   BACK SURGERY  01/15/09   L3, L4, and L5 posterolateral arthrodesis using segmental pedicle screw fixation and local autografting.   CARDIAC CATHETERIZATION     CARPAL TUNNEL RELEASE Bilateral    COLONOSCOPY     ESOPHAGOGASTRODUODENOSCOPY     EYE SURGERY     lens implant   JOINT REPLACEMENT  09/13/09   Right Hip, Dr. Alvan Dame   KYPHOPLASTY N/A 09/14/2014   Procedure: Lumbar Two Kyphoplasty/Vertebroplasty;  Surgeon: Charlie Pitter, MD;  Location: [facility];  Service: Neurosurgery;  Laterality: N/A;  Lumbar Two Kyphoplasty/Vertebroplasty   RIGHT HEART CATH N/A 11/19/2017    Procedure: RIGHT HEART CATH;  Surgeon: Larey Dresser, MD;  Location: [facility];  Service: Cardiovascular;  Laterality: N/A;   THYROIDECTOMY     TOTAL HIP ARTHROPLASTY Left 06/28/2013   Procedure: LEFT TOTAL  HIP ARTHROPLASTY ANTERIOR APPROACH;  Surgeon: Mauri Pole, MD;  Location: [facility];  Service: Orthopedics;  Laterality: Left;    Social History   Socioeconomic History   Marital status: Widowed    Spouse name: Not on file   Number of children: 2   Years of education: Not on file   Highest education level: Not on file  Occupational History   Not on file  Social Needs   Financial resource strain: Not on file   Food insecurity    Worry: Not on file    Inability: Not on file   Transportation needs    Medical:  Not on file    Non-medical: Not on file  Tobacco Use   Smoking status: Former Smoker    Packs/day: 0.50    Years: 32.00    Pack years: 16.00    Types: Cigarettes    Quit date: 07/22/1994    Years since quitting: 24.7   Smokeless tobacco: Never Used  Substance and Sexual Activity   Alcohol use: No    Alcohol/week: 0.0 standard drinks   Drug use: No   Sexual activity: Not Currently  Lifestyle   Physical activity    Days per week: Not on file    Minutes per session: Not on file   Stress: Not on file  Relationships   Social connections    Talks on phone: Not on file    Gets together: Not on file    Attends religious service: Not on file    Active member of club or organization: Not on file    Attends meetings of clubs or organizations: Not on file    Relationship status: Not on file   Intimate partner violence    Fear of current or ex partner: Not on file    Emotionally abused: Not on file    Physically abused: Not on file    Forced sexual activity: Not on file  Other Topics Concern   Not on file  Social History Narrative   Married 41 years and has 2 children (2 daughters)   Alcohol Use - no   Former Smoker - she quit 10  years ago, started when she was 12 and has had varying level of use of tobacco products from 1/2 pack to 3 packs per day.     Allergies  Allergen Reactions   Lipitor [Atorvastatin] Swelling   Penicillins Swelling    Has patient had a PCN reaction causing immediate rash, facial/tongue/throat swelling, SOB or lightheadedness with hypotension: Yes Has patient had a PCN reaction causing severe rash involving mucus membranes or skin necrosis: No Has patient had a PCN reaction that required hospitalization: No Has patient had a PCN reaction occurring within the last 10 years: No If all of the above answers are "NO", then may proceed with Cephalosporin use.    Metronidazole Swelling   Pregabalin Other (See Comments)    REACTION: Somnolence and dizziness   Simvastatin Other (See Comments)    Leg pain     Immunization History  Administered Date(s) Administered   Fluad Quad(high Dose 65+) 04/22/2019   Influenza Split 05/08/2011, 05/26/2012   Influenza Whole 06/03/2006, 05/31/2007, 04/26/2008, 05/23/2009, 05/20/2010   Influenza, High Dose Seasonal PF 05/23/2013, 04/03/2016, 06/03/2017, 04/12/2018   Influenza,inj,Quad PF,6+ Mos 04/27/2014, 06/15/2015   Pneumococcal Conjugate-13 04/27/2014   Pneumococcal Polysaccharide-23 06/18/2011, 07/03/2016   Tdap 06/18/2011    Outpatient Medications Prior to Visit  Medication Sig Dispense Refill   acetaminophen (TYLENOL) 500 MG tablet Take 1,000 mg by mouth every 6 (six) hours as needed for moderate pain.     ADCIRCA 20 MG tablet TAKE 2 TABLETS BY MOUTH ONCE DAILY. CALL [phone] FOR REFILLS. 60 tablet 10   albuterol (PROVENTIL HFA;VENTOLIN HFA) 108 (90 Base) MCG/ACT inhaler Inhale 2 puffs into the lungs every 6 (six) hours as needed for wheezing or shortness of breath. 1 Inhaler 6   albuterol (PROVENTIL) (2.5 MG/3ML) 0.083% nebulizer solution Take 3 mLs (2.5 mg total) by nebulization every 6 (six) hours as needed for wheezing or  shortness of breath. 30 mL 4   aspirin EC 81 MG tablet Take  81 mg by mouth at bedtime.      atenolol (TENORMIN) 50 MG tablet TAKE 1 TABLET BY MOUTH EVERY DAY 90 tablet 1   BESIVANCE 0.6 % SUSP Place 1 drop into the left eye See admin instructions. INSTILL 1 DROP INTO LEFT 4 TIMES DAILY x 2 DAYS AFTER EYE INJECTION  12   calcium carbonate (OSCAL) 1500 (600 Ca) MG TABS tablet Take 1,200 mg by mouth daily with breakfast.     Cholecalciferol (VITAMIN D) 2000 UNITS tablet Take 2,000 Units by mouth every evening.      Cinnamon 500 MG capsule Take 2,000 mg by mouth daily.       citalopram (CELEXA) 10 MG tablet TAKE 1 TABLET BY MOUTH EVERY DAY 90 tablet 1   Coenzyme Q10 200 MG capsule Take 200 mg by mouth at bedtime.      diphenoxylate-atropine (LOMOTIL) 2.5-0.025 MG tablet Take 1 tablet by mouth 4 (four) times daily as needed for diarrhea or loose stools. 30 tablet 1   fexofenadine (ALLEGRA) 180 MG tablet Take 180 mg by mouth daily.     folic acid (FOLVITE) 1 MG tablet Take 1 tablet (1 mg total) by mouth daily. 30 tablet 6   gabapentin (NEURONTIN) 300 MG capsule Take 1 capsule (300 mg total) by mouth at bedtime. 90 capsule 2   Glucosamine HCl (GLUCOSAMINE PO) Take 1 tablet by mouth every evening.      hydrochlorothiazide (MICROZIDE) 12.5 MG capsule TAKE 1 CAPSULE BY MOUTH EVERY DAY 90 capsule 3   ketoconazole (NIZORAL) 2 % cream Apply 1 application topically daily. 15 g 0   ketoconazole (NIZORAL) 2 % shampoo Apply 1 application topically 2 (two) times a week. (Patient taking differently: Apply 1 application topically every 14 (fourteen) days. ONCE EVERY 2 WEEKS PER PATIENT) 120 mL 03   levothyroxine (SYNTHROID) 112 MCG tablet TAKE 1 TABLET BY MOUTH DAILY BEFORE BREAKFAST. 90 tablet 1   nystatin (MYCOSTATIN) 100000 UNIT/ML suspension Take 5 mLs (500,000 Units total) by mouth 4 (four) times daily. 60 mL 0   Omega-3 Fatty Acids (FISH OIL) 1200 MG CAPS Take 1,200 mg by mouth daily.       omeprazole (PRILOSEC) 40 MG capsule TAKE 1 CAPSULE (40 MG TOTAL) BY MOUTH DAILY. 90 capsule 1   predniSONE (DELTASONE) 5 MG tablet TAKE 2 TABLETS BY MOUTH DAILY WITH BREAKFAST. 30 tablet 1   rosuvastatin (CRESTOR) 20 MG tablet TAKE 1 TABLET (20 MG TOTAL) BY MOUTH DAILY. 90 tablet 2   Selexipag (UPTRAVI) 800 MCG TABS Take 2 tablets (1,600 mcg total) by mouth 2 (two) times daily. 60 tablet 4   Spacer/Aero-Holding Chambers (AEROCHAMBER PLUS WITH MASK) inhaler Use as instructed 1 each 2   sulfamethoxazole-trimethoprim (BACTRIM) 400-80 MG tablet TAKE 1 TABLET BY MOUTH THREE TIMES A WEEK 12 tablet 3   SYMBICORT 160-4.5 MCG/ACT inhaler TAKE 2 PUFFS BY MOUTH TWICE A DAY 10.2 Inhaler 0   traMADol (ULTRAM) 50 MG tablet Take 2 tablets (100 mg total) by mouth every 6 (six) hours as needed for moderate pain or severe pain. 120 tablet 0   TREXALL 5 MG tablet TAKE 3 TABLETS BY MOUTH ONCE A WEEK CAUTION:CHEMOTHERAPY. PROTECT FROM LIGHT. 12 tablet 3   vitamin B-12 (CYANOCOBALAMIN) 1000 MCG tablet Take 1,000 mcg by mouth daily.     No facility-administered medications prior to visit.     Review of Systems  Constitutional: Negative for chills, fever and weight loss.  HENT: Positive for congestion. Negative for nosebleeds  and sore throat.   Eyes: Negative for blurred vision, double vision, photophobia and redness.  Respiratory: Positive for shortness of breath. Negative for cough, hemoptysis, sputum production and wheezing.   Cardiovascular: Negative for chest pain, palpitations and leg swelling.  Gastrointestinal: Positive for diarrhea. Negative for constipation, heartburn, nausea and vomiting.  Genitourinary: Negative.   Musculoskeletal: Negative for joint pain and myalgias.  Skin: Negative for itching and rash.  Neurological: Positive for tremors. Negative for dizziness, focal weakness and headaches.  Endo/Heme/Allergies: Positive for environmental allergies. Does not bruise/bleed easily.    Psychiatric/Behavioral: Negative.      Objective:   Vitals:   04/22/19 1525 04/22/19 1526  BP:  116/70  Pulse:  69  Temp: 98.2 F (36.8 C) 98.2 F (36.8 C)  TempSrc: Temporal Temporal  SpO2:  90%  Weight: 177 lb 12.8 oz (80.6 kg) 177 lb 12.8 oz (80.6 kg)  Height: 5' 3.5" (1.613 m) 5' 3.5" (1.613 m)   90% on 6 LPM  BMI Readings from Last 3 Encounters:  04/22/19 31.00 kg/m  10/21/18 30.47 kg/m  10/18/18 30.72 kg/m   Wt Readings from Last 3 Encounters:  04/22/19 177 lb 12.8 oz (80.6 kg)  10/21/18 172 lb (78 kg)  10/18/18 173 lb 6.4 oz (78.7 kg)    Physical Exam Vitals signs reviewed.  Constitutional:      Appearance: Normal appearance.  HENT:     Head: Normocephalic and atraumatic.     Nose:     Comments: Deferred due to masking requirement.    Mouth/Throat:     Comments: Deferred due to masking requirement. Eyes:     General: No scleral icterus. Neck:     Musculoskeletal: Neck supple.  Cardiovascular:     Rate and Rhythm: Normal rate and regular rhythm.     Comments: Prominent P2, no JVD Pulmonary:     Comments: Breathing comfortably on 6L [place]. Mild tachypnea, but no conversational dyspnea or truncated speech. CTAB. Abdominal:     General: There is no distension.     Palpations: Abdomen is soft.     Tenderness: There is no abdominal tenderness.  Musculoskeletal:        General: Swelling present. No deformity.     Comments: Sitting in a wheelchair  Lymphadenopathy:     Cervical: No cervical adenopathy.  Skin:    General: Skin is warm and dry.     Findings: No rash.  Neurological:     General: No focal deficit present.     Mental Status: She is alert.     Motor: No weakness.     Coordination: Coordination normal.     Comments: Head bobbing tremor  Psychiatric:        Mood and Affect: Mood normal.        Behavior: Behavior normal.      CBC    Component Value Date/Time   WBC 9.3 10/21/2018 1631   WBC 10.2 05/10/2018 1502   RBC 4.09  10/21/2018 1631   RBC 4.97 05/10/2018 1502   HGB 13.4 10/21/2018 1631   HCT 37.6 10/21/2018 1631   PLT 182 10/21/2018 1631   MCV 92 10/21/2018 1631   MCH 32.8 10/21/2018 1631   MCH 30.1 01/27/2018 0618   MCHC 35.6 10/21/2018 1631   MCHC 34.0 05/10/2018 1502   RDW 14.1 10/21/2018 1631   LYMPHSABS 1.5 10/21/2018 1631   MONOABS 1.2 (H) 01/27/2018 0618   EOSABS 0.1 10/21/2018 1631   BASOSABS 0.1 10/21/2018 1631  CHEMISTRY No results for input(s): NA, K, CL, CO2, GLUCOSE, BUN, CREATININE, CALCIUM, MG, PHOS in the last 168 hours. CrCl cannot be calculated (Patient's most recent lab result is older than the maximum 21 days allowed.).  BNP  10/18/2018    112 01/24/2018     557 12/04/2017        80   Chest Imaging- films reviewed: CT chest 10/19/2017- Centrilobular emphysema with scattered blebs.  No cavities.  Minimal nodularity along fissures.  Mild mediastinal adenopathy with calcification.  Prominent pulmonary artery.  Expiratory images with mild air trapping.  Stable pulmonary nodules.   Pulmonary Functions Testing Results: PFT Results Latest Ref Rng & Units 10/19/2017 05/16/2016  FVC-Pre L 2.40 2.28  FVC-Predicted Pre % 83 77  FVC-Post L 2.43 2.33  FVC-Predicted Post % 84 79  Pre FEV1/FVC % % 71 70  Post FEV1/FCV % % 75 76  FEV1-Pre L 1.70 1.59  FEV1-Predicted Pre % 78 72  FEV1-Post L 1.83 1.76  DLCO UNC% % 25 35  DLCO COR %Predicted % 34 43  TLC L 3.83 3.77  TLC % Predicted % 75 74  RV % Predicted % 60 57  No significant obstruction, mild restriction, very severely reduced DLCO.  Flow volume loop suggests obstruction.   Echocardiogram:  10/18/2018- LVEF 55 to 60%, mild LVH, diastolic dysfunction.  Normal left atrium.  Mildly enlarged RV with normal function.  Normal RA.  Trivial TR, otherwise normal valves.  01/11/2018- LVEF 55 to 60%, mild LVH, diastolic dysfunction.  Normal LA and RA.  Mildly dilated RV with moderately reduced systolic function.  PA peak pressure 88  mmHg.  Right Heart Catheterization 11/19/2017:   Most Recent Value  Fick Cardiac Output 3.73 L/min  Fick Cardiac Output Index 2.04 (L/min)/BSA  RA A Wave 7 mmHg  RA V Wave 5 mmHg  RA Mean 2 mmHg  RV Systolic Pressure 66 mmHg  RV Diastolic Pressure 1 mmHg  RV EDP 5 mmHg  PA Systolic Pressure 65 mmHg  PA Diastolic Pressure 16 mmHg  PA Mean 34 mmHg  PW A Wave 7 mmHg  PW V Wave 4 mmHg  PW Mean 3 mmHg  QP/QS 1  TPVR Index 16.69 HRUI   11/2017 EKG- QRS WNL    Assessment & Plan:     ICD-10-CM   1. Chronic obstructive pulmonary disease, unspecified COPD type ([place])  J44.9   2. Pulmonary HTN (HCC)  T03.54 COMPLETE METABOLIC PANEL WITH GFR    ECHOCARDIOGRAM COMPLETE    CBC w/Diff    B Nat Peptide    B Nat Peptide    CBC w/Diff    COMPLETE METABOLIC PANEL WITH GFR    CANCELED: B Nat Peptide    CANCELED: CBC w/Diff  3. Sarcoidosis  S56.8 COMPLETE METABOLIC PANEL WITH GFR    ECHOCARDIOGRAM COMPLETE    CBC w/Diff    B Nat Peptide    B Nat Peptide    CBC w/Diff    COMPLETE METABOLIC PANEL WITH GFR    CANCELED: B Nat Peptide    CANCELED: CBC w/Diff  4. Chronic respiratory failure with hypoxia (HCC)  J96.11 CBC w/Diff    B Nat Peptide    B Nat Peptide    CBC w/Diff    CANCELED: B Nat Peptide    CANCELED: CBC w/Diff  5. Localized edema   R60.0 B Nat Peptide    B Nat Peptide    CANCELED: B Nat Peptide  6. Need for immunization  against influenza  Z23 Flu Vaccine QUAD High Dose(Fluad)   Pulmonary arterial hypertension- predominantly WHO group 1, likely a contribution of WHO group 5.  Functional class III.  I am concerned that her pulmonary hypertension is progressing with lower extremity edema on exam and worsened functional status over time.  She initially had significant improvement on echocardiogram and BNP level after starting pulmonary vasodilator therapy. -BNP level & echocardiogram to assess for worsening PH or RV function. -Continue tadalafil and selexipag.  If needed, the  ideal add-on therapy would be an ERA, possibly with diuretics (GRIPHON trial).  I would try macitentan as this is associated with less edema than ambrisentan. - Discussed that if her BNP or echocardiogram demonstrate worsening of her pulmonary hypertension we will need to make medication adjustments.  We discussed that some therapies are offered IV, which are the most potent options available.  She seems resistant to this idea, but I asked her to consider it as it may become necessary in the future. -CMP, CBC today -Continue daily physical activity as tolerated.  She thinks the pulmonary rehabilitation would be too much of a burden on her daughter. -Flu shot today  COPD- predominantly emphysema - Okay to stop Symbicort given her lack of perceived benefit - Continue albuterol as needed - If she has wheezing or productive cough, will plan to restart.  Chronic hypoxic respiratory failure due to pulmonary hypertension -Continue supplemental oxygen as prescribed.  She has been notified to alert [place] if her resting saturations are staying below 88 or her ambulatory desaturations are dropping lower on a regular basis, which would warrant additional work-up.  Sarcoidosis- minimal pulmonary parenchymal abnormalities.  Follows with ophthalmology regularly- no history of uveitis. -Continue methotrexate 15 mg weekly plus folic acid -CBC and CMP today -Continue prednisone 5 mg; will consider tapering down once the results of her cardiac testing return - Continue Bactrim 3 days a week  RTC in 3 months.   Current Outpatient Medications:    acetaminophen (TYLENOL) 500 MG tablet, Take 1,000 mg by mouth every 6 (six) hours as needed for moderate pain., Disp: , Rfl:    ADCIRCA 20 MG tablet, TAKE 2 TABLETS BY MOUTH ONCE DAILY. CALL [phone] FOR REFILLS., Disp: 60 tablet, Rfl: 10   albuterol (PROVENTIL HFA;VENTOLIN HFA) 108 (90 Base) MCG/ACT inhaler, Inhale 2 puffs into the lungs every 6 (six) hours as  needed for wheezing or shortness of breath., Disp: 1 Inhaler, Rfl: 6   albuterol (PROVENTIL) (2.5 MG/3ML) 0.083% nebulizer solution, Take 3 mLs (2.5 mg total) by nebulization every 6 (six) hours as needed for wheezing or shortness of breath., Disp: 30 mL, Rfl: 4   aspirin EC 81 MG tablet, Take 81 mg by mouth at bedtime. , Disp: , Rfl:    atenolol (TENORMIN) 50 MG tablet, TAKE 1 TABLET BY MOUTH EVERY DAY, Disp: 90 tablet, Rfl: 1   BESIVANCE 0.6 % SUSP, Place 1 drop into the left eye See admin instructions. INSTILL 1 DROP INTO LEFT 4 TIMES DAILY x 2 DAYS AFTER EYE INJECTION, Disp: , Rfl: 12   calcium carbonate (OSCAL) 1500 (600 Ca) MG TABS tablet, Take 1,200 mg by mouth daily with breakfast., Disp: , Rfl:    Cholecalciferol (VITAMIN D) 2000 UNITS tablet, Take 2,000 Units by mouth every evening. , Disp: , Rfl:    Cinnamon 500 MG capsule, Take 2,000 mg by mouth daily.  , Disp: , Rfl:    citalopram (CELEXA) 10 MG tablet, TAKE 1 TABLET BY  MOUTH EVERY DAY, Disp: 90 tablet, Rfl: 1   Coenzyme Q10 200 MG capsule, Take 200 mg by mouth at bedtime. , Disp: , Rfl:    diphenoxylate-atropine (LOMOTIL) 2.5-0.025 MG tablet, Take 1 tablet by mouth 4 (four) times daily as needed for diarrhea or loose stools., Disp: 30 tablet, Rfl: 1   fexofenadine (ALLEGRA) 180 MG tablet, Take 180 mg by mouth daily., Disp: , Rfl:    folic acid (FOLVITE) 1 MG tablet, Take 1 tablet (1 mg total) by mouth daily., Disp: 30 tablet, Rfl: 6   gabapentin (NEURONTIN) 300 MG capsule, Take 1 capsule (300 mg total) by mouth at bedtime., Disp: 90 capsule, Rfl: 2   Glucosamine HCl (GLUCOSAMINE PO), Take 1 tablet by mouth every evening. , Disp: , Rfl:    hydrochlorothiazide (MICROZIDE) 12.5 MG capsule, TAKE 1 CAPSULE BY MOUTH EVERY DAY, Disp: 90 capsule, Rfl: 3   ketoconazole (NIZORAL) 2 % cream, Apply 1 application topically daily., Disp: 15 g, Rfl: 0   ketoconazole (NIZORAL) 2 % shampoo, Apply 1 application topically 2 (two) times a  week. (Patient taking differently: Apply 1 application topically every 14 (fourteen) days. ONCE EVERY 2 WEEKS PER PATIENT), Disp: 120 mL, Rfl: 03   levothyroxine (SYNTHROID) 112 MCG tablet, TAKE 1 TABLET BY MOUTH DAILY BEFORE BREAKFAST., Disp: 90 tablet, Rfl: 1   nystatin (MYCOSTATIN) 100000 UNIT/ML suspension, Take 5 mLs (500,000 Units total) by mouth 4 (four) times daily., Disp: 60 mL, Rfl: 0   Omega-3 Fatty Acids (FISH OIL) 1200 MG CAPS, Take 1,200 mg by mouth daily. , Disp: , Rfl:    omeprazole (PRILOSEC) 40 MG capsule, TAKE 1 CAPSULE (40 MG TOTAL) BY MOUTH DAILY., Disp: 90 capsule, Rfl: 1   predniSONE (DELTASONE) 5 MG tablet, TAKE 2 TABLETS BY MOUTH DAILY WITH BREAKFAST., Disp: 30 tablet, Rfl: 1   rosuvastatin (CRESTOR) 20 MG tablet, TAKE 1 TABLET (20 MG TOTAL) BY MOUTH DAILY., Disp: 90 tablet, Rfl: 2   Selexipag (UPTRAVI) 800 MCG TABS, Take 2 tablets (1,600 mcg total) by mouth 2 (two) times daily., Disp: 60 tablet, Rfl: 4   Spacer/Aero-Holding Chambers (AEROCHAMBER PLUS WITH MASK) inhaler, Use as instructed, Disp: 1 each, Rfl: 2   sulfamethoxazole-trimethoprim (BACTRIM) 400-80 MG tablet, TAKE 1 TABLET BY MOUTH THREE TIMES A WEEK, Disp: 12 tablet, Rfl: 3   SYMBICORT 160-4.5 MCG/ACT inhaler, TAKE 2 PUFFS BY MOUTH TWICE A DAY, Disp: 10.2 Inhaler, Rfl: 0   traMADol (ULTRAM) 50 MG tablet, Take 2 tablets (100 mg total) by mouth every 6 (six) hours as needed for moderate pain or severe pain., Disp: 120 tablet, Rfl: 0   TREXALL 5 MG tablet, TAKE 3 TABLETS BY MOUTH ONCE A WEEK CAUTION:CHEMOTHERAPY. PROTECT FROM LIGHT., Disp: 12 tablet, Rfl: 3   vitamin B-12 (CYANOCOBALAMIN) 1000 MCG tablet, Take 1,000 mcg by mouth daily., Disp: , Rfl:    Julian Hy, DO [facility] 04/22/2019 5:09 PM

## 2019-04-23 LAB — COMPLETE METABOLIC PANEL WITH GFR
AG Ratio: 1.8 (calc) (ref 1.0–2.5)
ALT: 13 U/L (ref 6–29)
AST: 15 U/L (ref 10–35)
Albumin: 4.2 g/dL (ref 3.6–5.1)
Alkaline phosphatase (APISO): 42 U/L (ref 37–153)
BUN/Creatinine Ratio: 11 (calc) (ref 6–22)
BUN: 12 mg/dL (ref 7–25)
CO2: 28 mmol/L (ref 20–32)
Calcium: 9.5 mg/dL (ref 8.6–10.4)
Chloride: 102 mmol/L (ref 98–110)
Creat: 1.06 mg/dL — ABNORMAL HIGH (ref 0.60–0.93)
GFR, Est African American: 59 mL/min/{1.73_m2} — ABNORMAL LOW (ref >=60)
GFR, Est Non African American: 51 mL/min/{1.73_m2} — ABNORMAL LOW (ref >=60)
Globulin: 2.3 g/dL (calc) (ref 1.9–3.7)
Glucose, Bld: 177 mg/dL — ABNORMAL HIGH (ref 65–99)
Potassium: 3.9 mmol/L (ref 3.5–5.3)
Sodium: 141 mmol/L (ref 135–146)
Total Bilirubin: 0.5 mg/dL (ref 0.2–1.2)
Total Protein: 6.5 g/dL (ref 6.1–8.1)

## 2019-04-23 LAB — CBC WITH DIFFERENTIAL/PLATELET
Absolute Monocytes: 697 cells/uL (ref 200–950)
Basophils Absolute: 43 cells/uL (ref 0–200)
Basophils Relative: 0.5 %
Eosinophils Absolute: 112 cells/uL (ref 15–500)
Eosinophils Relative: 1.3 %
HCT: 39.8 % (ref 35.0–45.0)
Hemoglobin: 13.7 g/dL (ref 11.7–15.5)
Lymphs Abs: 1565 cells/uL (ref 850–3900)
MCH: 31.4 pg (ref 27.0–33.0)
MCHC: 34.4 g/dL (ref 32.0–36.0)
MCV: 91.1 fL (ref 80.0–100.0)
MPV: 10.8 fL (ref 7.5–12.5)
Monocytes Relative: 8.1 %
Neutro Abs: 6183 cells/uL (ref 1500–7800)
Neutrophils Relative %: 71.9 %
Platelets: 178 10*3/uL (ref 140–400)
RBC: 4.37 10*6/uL (ref 3.80–5.10)
RDW: 13.4 % (ref 11.0–15.0)
Total Lymphocyte: 18.2 %
WBC: 8.6 10*3/uL (ref 3.8–10.8)

## 2019-04-23 LAB — BRAIN NATRIURETIC PEPTIDE: Brain Natriuretic Peptide: 137 pg/mL — ABNORMAL HIGH (ref <100)

## 2019-04-25 NOTE — Progress Notes (Signed)
Please let Kimberly Irwin know that her blood test result is back and I will call her with my plan after we see her echocardiogram results. Her other labs look great. Thank you!

## 2019-04-26 ENCOUNTER — Telehealth (HOSPITAL_COMMUNITY): Payer: Self-pay | Admitting: Pharmacy Technician

## 2019-04-26 ENCOUNTER — Telehealth (HOSPITAL_COMMUNITY): Payer: Self-pay | Admitting: Pharmacist

## 2019-04-26 NOTE — Telephone Encounter (Signed)
Advanced Heart Failure Patient Advocate Encounter  Prior Authorization for Tadalafil 80m has been approved.    PA# [id number] dates: 04/26/2019 through 08/04/2019  Patients co-pay is $11.52 for 30-Day supply.  Called and spoke with patient to make her aware of this change and to make sure that she can afford the co-pay. Explained to her that we would go ahead and send the prescription to [facility] #[phone] ask them to place it on hold. This way in November when her assistance is up she will already have an active prescription ready to go.  ECharlann Boxer CPhT

## 2019-04-26 NOTE — Telephone Encounter (Signed)
Patient Advocate Encounter   Received notification from [employer] that prior authorization for tadalafil is required.   PA submitted on CoverMyMeds Key [id number]  Status is pending   Will continue to follow.  Audry Riles, PharmD, BCPS, CPP Heart Failure Clinic Pharmacist [phone]

## 2019-04-28 ENCOUNTER — Other Ambulatory Visit: Payer: Self-pay

## 2019-04-28 ENCOUNTER — Ambulatory Visit (HOSPITAL_COMMUNITY): Payer: Medicare Other | Attending: Internal Medicine

## 2019-04-28 DIAGNOSIS — D869 Sarcoidosis, unspecified: Secondary | ICD-10-CM | POA: Diagnosis not present

## 2019-04-28 DIAGNOSIS — I272 Pulmonary hypertension, unspecified: Secondary | ICD-10-CM

## 2019-04-29 MED ORDER — POTASSIUM CHLORIDE ER 10 MEQ PO TBCR: 10.0000 meq | EXTENDED_RELEASE_TABLET | Freq: Every day | ORAL | 3 refills | Status: DC

## 2019-04-29 MED ORDER — FUROSEMIDE 20 MG PO TABS: 20.0000 mg | ORAL_TABLET | Freq: Every day | ORAL | 3 refills | Status: DC

## 2019-04-29 NOTE — Addendum Note (Signed)
Addended by: Julian Hy on: 04/29/2019 11:21 AM   Modules accepted: Orders

## 2019-04-29 NOTE — Progress Notes (Signed)
Echo stable with mildly increased BNP and worsened symptoms. Given stable echo, will not change pulm vasodilators, but will add lasix + KCl daily. Recheck labs in 2 weeks. I discussed with Ms. Malan on the phone.

## 2019-05-10 ENCOUNTER — Other Ambulatory Visit (HOSPITAL_COMMUNITY): Payer: Self-pay | Admitting: *Deleted

## 2019-05-10 ENCOUNTER — Ambulatory Visit (HOSPITAL_COMMUNITY)
Admission: RE | Admit: 2019-05-10 | Discharge: 2019-05-10 | Disposition: A | Discharge status: Home / Self Care | Payer: Medicare Other | Source: Ambulatory Visit | Attending: Internal Medicine | Admitting: Internal Medicine

## 2019-05-10 ENCOUNTER — Encounter (HOSPITAL_COMMUNITY): Payer: Self-pay | Admitting: Internal Medicine

## 2019-05-10 ENCOUNTER — Other Ambulatory Visit: Payer: Self-pay

## 2019-05-10 VITALS — BP 104/52 | HR 58 | Wt 175.2 lb

## 2019-05-10 DIAGNOSIS — E785 Hyperlipidemia, unspecified: Secondary | ICD-10-CM | POA: Diagnosis not present

## 2019-05-10 DIAGNOSIS — I2510 Atherosclerotic heart disease of native coronary artery without angina pectoris: Secondary | ICD-10-CM | POA: Diagnosis not present

## 2019-05-10 DIAGNOSIS — D860 Sarcoidosis of lung: Secondary | ICD-10-CM | POA: Insufficient documentation

## 2019-05-10 DIAGNOSIS — I272 Pulmonary hypertension, unspecified: Secondary | ICD-10-CM

## 2019-05-10 DIAGNOSIS — Z8249 Family history of ischemic heart disease and other diseases of the circulatory system: Secondary | ICD-10-CM | POA: Insufficient documentation

## 2019-05-10 DIAGNOSIS — Z7952 Long term (current) use of systemic steroids: Secondary | ICD-10-CM | POA: Insufficient documentation

## 2019-05-10 DIAGNOSIS — J9611 Chronic respiratory failure with hypoxia: Secondary | ICD-10-CM | POA: Insufficient documentation

## 2019-05-10 DIAGNOSIS — J439 Emphysema, unspecified: Secondary | ICD-10-CM | POA: Insufficient documentation

## 2019-05-10 DIAGNOSIS — Z87891 Personal history of nicotine dependence: Secondary | ICD-10-CM | POA: Insufficient documentation

## 2019-05-10 DIAGNOSIS — Z79890 Hormone replacement therapy (postmenopausal): Secondary | ICD-10-CM | POA: Diagnosis not present

## 2019-05-10 DIAGNOSIS — D649 Anemia, unspecified: Secondary | ICD-10-CM | POA: Insufficient documentation

## 2019-05-10 DIAGNOSIS — Z7982 Long term (current) use of aspirin: Secondary | ICD-10-CM | POA: Diagnosis not present

## 2019-05-10 DIAGNOSIS — Z9981 Dependence on supplemental oxygen: Secondary | ICD-10-CM | POA: Insufficient documentation

## 2019-05-10 DIAGNOSIS — Z7951 Long term (current) use of inhaled steroids: Secondary | ICD-10-CM | POA: Diagnosis not present

## 2019-05-10 DIAGNOSIS — E039 Hypothyroidism, unspecified: Secondary | ICD-10-CM | POA: Insufficient documentation

## 2019-05-10 DIAGNOSIS — Z803 Family history of malignant neoplasm of breast: Secondary | ICD-10-CM | POA: Insufficient documentation

## 2019-05-10 DIAGNOSIS — Z825 Family history of asthma and other chronic lower respiratory diseases: Secondary | ICD-10-CM | POA: Insufficient documentation

## 2019-05-10 DIAGNOSIS — I5032 Chronic diastolic (congestive) heart failure: Secondary | ICD-10-CM | POA: Diagnosis not present

## 2019-05-10 DIAGNOSIS — Z79899 Other long term (current) drug therapy: Secondary | ICD-10-CM | POA: Diagnosis not present

## 2019-05-10 DIAGNOSIS — Z8349 Family history of other endocrine, nutritional and metabolic diseases: Secondary | ICD-10-CM | POA: Diagnosis not present

## 2019-05-10 LAB — BASIC METABOLIC PANEL
Anion gap: 12 (ref 5–15)
BUN: 16 mg/dL (ref 8–23)
CO2: 27 mmol/L (ref 22–32)
Calcium: 9.5 mg/dL (ref 8.9–10.3)
Chloride: 100 mmol/L (ref 98–111)
Creatinine, Ser: 1.06 mg/dL — ABNORMAL HIGH (ref 0.44–1.00)
GFR calc Af Amer: 59 mL/min — ABNORMAL LOW (ref >60)
GFR calc non Af Amer: 51 mL/min — ABNORMAL LOW (ref >60)
Glucose, Bld: 162 mg/dL — ABNORMAL HIGH (ref 70–99)
Potassium: 3.5 mmol/L (ref 3.5–5.1)
Sodium: 139 mmol/L (ref 135–145)

## 2019-05-10 LAB — CBC
HCT: 41.6 % (ref 36.0–46.0)
Hemoglobin: 14.1 g/dL (ref 12.0–15.0)
MCH: 32 pg (ref 26.0–34.0)
MCHC: 33.9 g/dL (ref 30.0–36.0)
MCV: 94.3 fL (ref 80.0–100.0)
Platelets: 169 10*3/uL (ref 150–400)
RBC: 4.41 MIL/uL (ref 3.87–5.11)
RDW: 13.8 % (ref 11.5–15.5)
WBC: 8.6 10*3/uL (ref 4.0–10.5)
nRBC: 0 % (ref 0.0–0.2)

## 2019-05-10 MED ORDER — SODIUM CHLORIDE 0.9% FLUSH: 3.0000 mL | Freq: Two times a day (BID) | INTRAVENOUS | Status: DC

## 2019-05-10 NOTE — Patient Instructions (Signed)
[facility] nurse will work with you to increase your Kimberly Irwin gradually to 2400 mcg Twice daily   Labs done today, we will let you know of any abnormal results  Heart Catheterization, see instructions below  Your physician recommends that you schedule a follow-up appointment in: 1 month  You are scheduled for Cardiac Catheterization on Friday 05/27/2019 with Dr. Aundra Dubin.  Please arrive at the [place] [facility] at 10 a.m. on the day of your procedure.  1. DIET _X__ Nothing to eat or drink after midnight except your medications with a sip of water.  2. PLEASE GO FOR YOU COVID TEST ON Tuesday 10/23  3. MAKE SURE YOU TAKE YOUR ASPIRIN.  4. _X__ DO NOT TAKE these medications before your procedure:                  FUROSEMIDE AND HYDROCHLORATHIAZIDE     ___ YOU MAY TAKE ALL of your remaining medications with a small amount of water  5. Plan for one night stay - bring personal belongings (i.e. toothpaste, toothbrush, etc.)  6. Bring a current list of your medications and current insurance cards.  7. Must have a responsible person to drive you home.  8. Someone must be with you for the first 24 hours after you arrive home.  9. Please wear clothes that are easy to get on and off and wear slip-on shoes.  * Special note: Every effort is made to have your procedure done on time. Occasionally there are emergencies that present themselves at the hospital that may cause delays. Please be patient if a delay does occur.  If you have any questions after you get home, please call the office at the number listed above.

## 2019-05-10 NOTE — H&P (View-Only) (Signed)
PCP: Dr. Charlett Blake Pulmonary: Dr. Lake Bells Cardiology: Dr. Aundra Dubin  73 y.o. with history of sarcoidosis and emphysema was referred by Dr Lake Bells for evaluation of pulmonary hypertension.  She has a diagnosis of sarcoidosis, but CT chest in 3/19 did not show marked parenchymal disease.  PFTs showed very low DLCO.  She had RHC done in 4/19 with moderate pulmonary hypertension and very high PVR.    She is now wearing up to 6L home oxygen by [place] at all times.  She does not smoke, says she quit > 20 years ago.    Echo in 6/19 showed EF 55-60%, mildly dilated RV with moderately decreased systolic function, PASP 83 mmHg, aortic sclerosis without significant stenosis.   She did not tolerate Opsumit.   Echo in 3/20 showed EF 55-60% with mild LVH, mild RV dilation with normal systolic function, PASP 31 mmHg.  Echo was done again in 9/20, showing EF 60-65%.  I reviewed the study, and think that the RV was mildly dilated with normal systolic function. PASP 51 mmHg. IVC normal.   She saw pulmonary recently and was started on Lasix 20 mg daily.   She returns for f/u of pulmonary HTN.  Wears 4-6 L O2 at all times.  Weight is up 2 lbs.  She is on Adcirca and selexipag now, really has not felt better on PH meds.  Pulmonary has had her on prednisone and MTX for sarcoidosis.  She has stable dyspnea, gets short of breath if she "rushes" or walks up stairs or inclines. No dyspnea walking around her house. No orthopnea/PND.  No chest pain.  No lightheadedness/syncope. 6 minute walk is better than it was a year ago.   6 minute walk (5/19): 122 m 6 minute walk (9/19):  73 m (only walked for about 1 minute).  6 minute walk (10/20): 109 m  Labs (3/19): LDL 53 Labs (4/19): hgb 8.5, K 4.8, creatinine 1.06 Labs (5/19): anti-SCL70 negative, ANA negative, RF negative, ANCA negative, HIV negative, anti-centromere negative.  Labs (6/19): K 4, creatinine 1.03 Labs (9/19): K 4.6, creatinine 1.18 Labs (10/19): TSH normal, K 4.1,  creatinine 1. Labs (2/20): LDL 103, K 4.2, creatinine 0.99 Labs (9/20): K 3.9, creatinine 1.06, BNP 137  PMH: 1. Sarcoidosis: On home oxygen.  - PFTs (3/19): FEV1 84%, FVC 84%, ratio 71%, TLC 75%, DLCO 25% - CT (3/19) with mild fibrotic changes in the lung bases.  2. Emphysema: Moderate emphysema on CT chest 3/19.  3. CAD: Calcification of coronaries on CT chest in 3/19.  - Cardiolite in 1/19: EF 65%, no ischemia/infarction.  4. HTN 5. Hyperlipidemia 6. Hypothyroidism 7. Anemia 8. Pulmonary hypertension:  - Echo (9/17): EF 60-65%, PASP 44 mmHg.  - RHC (4/19): mean RA 2, PA 66/16 mean 34, mean PCWP 3, PVR 8.3 WU, CI 2.04 - Autoimmune serologies negative.  - V/Q scan did not show evidence for chronic PE.  - Echo (6/19): EF 55-60%, mildly dilated RV with moderately decreased systolic function, PASP 83 mmHg, aortic sclerosis without significant stenosis.  - Echo (3/20): EF 55-60%, mild LVH, mild RV dilation with normal systolic function, PASP 31 mmHg.  - Echo (9/20): EF 60-65%, mild RV dilation with normal systolic function, PASP 51 mmHg, normal IVC.   Review of systems complete and found to be negative unless listed in HPI.   Social History   Socioeconomic History  . Marital status: Widowed    Spouse name: Not on file  . Number of children: 2  .  Years of education: Not on file  . Highest education level: Not on file  Occupational History  . Not on file  Social Needs  . Financial resource strain: Not on file  . Food insecurity    Worry: Not on file    Inability: Not on file  . Transportation needs    Medical: Not on file    Non-medical: Not on file  Tobacco Use  . Smoking status: Former Smoker    Packs/day: 0.50    Years: 32.00    Pack years: 16.00    Types: Cigarettes    Quit date: 07/22/1994    Years since quitting: 24.8  . Smokeless tobacco: Never Used  Substance and Sexual Activity  . Alcohol use: No    Alcohol/week: 0.0 standard drinks  . Drug use: No  .  Sexual activity: Not Currently  Lifestyle  . Physical activity    Days per week: Not on file    Minutes per session: Not on file  . Stress: Not on file  Relationships  . Social Herbalist on phone: Not on file    Gets together: Not on file    Attends religious service: Not on file    Active member of club or organization: Not on file    Attends meetings of clubs or organizations: Not on file    Relationship status: Not on file  . Intimate partner violence    Fear of current or ex partner: Not on file    Emotionally abused: Not on file    Physically abused: Not on file    Forced sexual activity: Not on file  Other Topics Concern  . Not on file  Social History Narrative   Married 95 years and has 2 children (2 daughters)   Alcohol Use - no   Former Smoker - she quit 10 years ago, started when she was 69 and has had varying level of use of tobacco products from 1/2 pack to 3 packs per day.   Family History  Problem Relation Age of Onset  . Cancer Mother 33       Breast  . Heart disease Mother   . Hypertension Mother   . Hyperlipidemia Mother   . Heart attack Mother   . Asthma Maternal Grandmother     Current Outpatient Medications  Medication Sig Dispense Refill  . acetaminophen (TYLENOL) 500 MG tablet Take 1,000 mg by mouth every 6 (six) hours as needed for moderate pain.    Marland Kitchen ADCIRCA 20 MG tablet TAKE 2 TABLETS BY MOUTH ONCE DAILY. CALL [phone] FOR REFILLS. 60 tablet 10  . albuterol (PROVENTIL HFA;VENTOLIN HFA) 108 (90 Base) MCG/ACT inhaler Inhale 2 puffs into the lungs every 6 (six) hours as needed for wheezing or shortness of breath. 1 Inhaler 6  . albuterol (PROVENTIL) (2.5 MG/3ML) 0.083% nebulizer solution Take 3 mLs (2.5 mg total) by nebulization every 6 (six) hours as needed for wheezing or shortness of breath. 30 mL 4  . aspirin EC 81 MG tablet Take 81 mg by mouth at bedtime.     Marland Kitchen atenolol (TENORMIN) 25 MG tablet Take 2 tablets by mouth every morning  and take 1 tablet by mouth every evening.    Marland Kitchen BESIVANCE 0.6 % SUSP Place 1 drop into the left eye See admin instructions. INSTILL 1 DROP INTO LEFT 4 TIMES DAILY x 2 DAYS AFTER EYE INJECTION  12  . calcium carbonate (OSCAL) 1500 (600 Ca) MG TABS tablet Take  1,200 mg by mouth daily with breakfast.    . Cholecalciferol (VITAMIN D) 2000 UNITS tablet Take 2,000 Units by mouth every evening.     . Cinnamon 500 MG capsule Take 2,000 mg by mouth daily.      . citalopram (CELEXA) 10 MG tablet TAKE 1 TABLET BY MOUTH EVERY DAY 90 tablet 1  . Coenzyme Q10 200 MG capsule Take 200 mg by mouth at bedtime.     . diphenoxylate-atropine (LOMOTIL) 2.5-0.025 MG tablet Take 1 tablet by mouth 4 (four) times daily as needed for diarrhea or loose stools. 30 tablet 1  . fexofenadine (ALLEGRA) 180 MG tablet Take 180 mg by mouth daily.    . folic acid (FOLVITE) 1 MG tablet Take 1 tablet (1 mg total) by mouth daily. 30 tablet 6  . furosemide (LASIX) 20 MG tablet Take 1 tablet (20 mg total) by mouth daily. 30 tablet 3  . gabapentin (NEURONTIN) 300 MG capsule Take 1 capsule (300 mg total) by mouth at bedtime. 90 capsule 2  . Glucosamine HCl (GLUCOSAMINE PO) Take 1 tablet by mouth every evening.     . hydrochlorothiazide (MICROZIDE) 12.5 MG capsule TAKE 1 CAPSULE BY MOUTH EVERY DAY 90 capsule 3  . ketoconazole (NIZORAL) 2 % cream Apply 1 application topically daily as needed for irritation.    Marland Kitchen ketoconazole (NIZORAL) 2 % shampoo Apply 1 application topically 2 (two) times a week.    . levothyroxine (SYNTHROID) 112 MCG tablet TAKE 1 TABLET BY MOUTH DAILY BEFORE BREAKFAST. 90 tablet 1  . nystatin (MYCOSTATIN) 100000 UNIT/ML suspension Take 5 mLs (500,000 Units total) by mouth 4 (four) times daily. 60 mL 0  . Omega-3 Fatty Acids (FISH OIL) 1200 MG CAPS Take 1,200 mg by mouth daily.     Marland Kitchen omeprazole (PRILOSEC) 40 MG capsule TAKE 1 CAPSULE (40 MG TOTAL) BY MOUTH DAILY. 90 capsule 1  . potassium chloride (K-DUR) 10 MEQ tablet Take  1 tablet (10 mEq total) by mouth daily. 30 tablet 3  . predniSONE (DELTASONE) 5 MG tablet Take 5 mg by mouth daily with breakfast.    . rosuvastatin (CRESTOR) 20 MG tablet TAKE 1 TABLET (20 MG TOTAL) BY MOUTH DAILY. 90 tablet 2  . Selexipag (UPTRAVI) 800 MCG TABS Take 2 tablets (1,600 mcg total) by mouth 2 (two) times daily. 60 tablet 4  . Spacer/Aero-Holding Chambers (AEROCHAMBER PLUS WITH MASK) inhaler Use as instructed 1 each 2  . sulfamethoxazole-trimethoprim (BACTRIM) 400-80 MG tablet TAKE 1 TABLET BY MOUTH THREE TIMES A WEEK 12 tablet 3  . SYMBICORT 160-4.5 MCG/ACT inhaler TAKE 2 PUFFS BY MOUTH TWICE A DAY 10.2 Inhaler 0  . traMADol (ULTRAM) 50 MG tablet Take 2 tablets (100 mg total) by mouth every 6 (six) hours as needed for moderate pain or severe pain. 120 tablet 0  . TREXALL 5 MG tablet TAKE 3 TABLETS BY MOUTH ONCE A WEEK CAUTION:CHEMOTHERAPY. PROTECT FROM LIGHT. 12 tablet 3  . vitamin B-12 (CYANOCOBALAMIN) 1000 MCG tablet Take 1,000 mcg by mouth daily.     No current facility-administered medications for this encounter.    BP (!) 104/52   Pulse (!) 58   Wt 79.5 kg (175 lb 3.2 oz)   SpO2 98%   BMI 30.55 kg/m   Wt Readings from Last 3 Encounters:  05/10/19 79.5 kg (175 lb 3.2 oz)  04/22/19 80.6 kg (177 lb 12.8 oz)  10/21/18 78 kg (172 lb)   General: NAD Neck: No JVD, no thyromegaly or thyroid  nodule.  Lungs: Clear to auscultation bilaterally with normal respiratory effort. CV: Nondisplaced PMI.  Heart regular S1/S2, no S3/S4, no murmur.  No peripheral edema.  No carotid bruit.  Normal pedal pulses.  Abdomen: Soft, nontender, no hepatosplenomegaly, no distention.  Skin: Intact without lesions or rashes.  Neurologic: Alert and oriented x 3.  Psych: Normal affect. Extremities: No clubbing or cyanosis.  HEENT: Normal.   Assessment/Plan: 1. Pulmonary hypertension: Cath 4/19 with moderate PAH, very high PVR. Right and left heart filling pressures were low.  PFTs in 3/19 showed  mild restriction with very low DLCO consistent with pulmonary vascular disease.  V/Q scan with no evidence for chronic PE and autoimmune serologies were negative. High resolution CT in 3/19 showed mild fibrotic changes at the lung bases and a degree of emphysema.  I do not think CT findings can explain her PH.  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  Echo 6/19 showed normal LV EF but mildly dilated/moderately dysfunctional RV with severe pulmonary hypertension. 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  As parenchymal disease does not appear particularly bad on CT, we have been trying her on pulmonary vasodilators. She did not tolerate Opsumit.  Now on selexipag and Adcirca but without much symptomatic improvement. However, echoes have shown a fall in PA pressure compared to initial echo (though higher on 9/20 echo than 3/20 echo).  She is not volume overloaded on exam.    - Continue Adcirca 40 mg daily.  - I will have her Uptravi gradually increased to 2400 mcg bid.  - She did not tolerate Opsumi.  - I am going to arrange for repeat RHC.  She has not improved much symptomatically though PA pressure does seem to be lower noninvasively.  If PA pressure or filling pressures are elevated on RHC, this may explain her ongoing exertional dyspnea.  If not, then it is possible symptoms are primarily due to pulmonary parenchymal disease. We discussed risks/benefits of procedure and she agreed.  2. Chronic hypoxemic respiratory failure: She has pulmonary sarcoidosis and emphysema.  She is on home oxygen.  Pulmonary hypertension also likely plays a role in low oxygen saturation.   - Continue home oxygen.  - She is on prednisone and MTX.   3. HTN: BP controlled.  - Continue HCTZ 12.5 mg daily and atenolol.   Followup in 1 month    [name], 05/10/2019

## 2019-05-10 NOTE — Progress Notes (Signed)
PCP: Dr. Charlett Blake Pulmonary: Dr. Lake Bells Cardiology: Dr. Aundra Dubin  73 y.o. with history of sarcoidosis and emphysema was referred by Dr Lake Bells for evaluation of pulmonary hypertension.  She has a diagnosis of sarcoidosis, but CT chest in 3/19 did not show marked parenchymal disease.  PFTs showed very low DLCO.  She had RHC done in 4/19 with moderate pulmonary hypertension and very high PVR.    She is now wearing up to 6L home oxygen by [place] at all times.  She does not smoke, says she quit > 20 years ago.    Echo in 6/19 showed EF 55-60%, mildly dilated RV with moderately decreased systolic function, PASP 83 mmHg, aortic sclerosis without significant stenosis.   She did not tolerate Opsumit.   Echo in 3/20 showed EF 55-60% with mild LVH, mild RV dilation with normal systolic function, PASP 31 mmHg.  Echo was done again in 9/20, showing EF 60-65%.  I reviewed the study, and think that the RV was mildly dilated with normal systolic function. PASP 51 mmHg. IVC normal.   She saw pulmonary recently and was started on Lasix 20 mg daily.   She returns for f/u of pulmonary HTN.  Wears 4-6 L O2 at all times.  Weight is up 2 lbs.  She is on Adcirca and selexipag now, really has not felt better on PH meds.  Pulmonary has had her on prednisone and MTX for sarcoidosis.  She has stable dyspnea, gets short of breath if she "rushes" or walks up stairs or inclines. No dyspnea walking around her house. No orthopnea/PND.  No chest pain.  No lightheadedness/syncope. 6 minute walk is better than it was a year ago.   6 minute walk (5/19): 122 m 6 minute walk (9/19):  75 m (only walked for about 1 minute).  6 minute walk (10/20): 109 m  Labs (3/19): LDL 53 Labs (4/19): hgb 8.5, K 4.8, creatinine 1.06 Labs (5/19): anti-SCL70 negative, ANA negative, RF negative, ANCA negative, HIV negative, anti-centromere negative.  Labs (6/19): K 4, creatinine 1.03 Labs (9/19): K 4.6, creatinine 1.18 Labs (10/19): TSH normal, K 4.1,  creatinine 1. Labs (2/20): LDL 103, K 4.2, creatinine 0.99 Labs (9/20): K 3.9, creatinine 1.06, BNP 137  PMH: 1. Sarcoidosis: On home oxygen.  - PFTs (3/19): FEV1 84%, FVC 84%, ratio 71%, TLC 75%, DLCO 25% - CT (3/19) with mild fibrotic changes in the lung bases.  2. Emphysema: Moderate emphysema on CT chest 3/19.  3. CAD: Calcification of coronaries on CT chest in 3/19.  - Cardiolite in 1/19: EF 65%, no ischemia/infarction.  4. HTN 5. Hyperlipidemia 6. Hypothyroidism 7. Anemia 8. Pulmonary hypertension:  - Echo (9/17): EF 60-65%, PASP 44 mmHg.  - RHC (4/19): mean RA 2, PA 66/16 mean 34, mean PCWP 3, PVR 8.3 WU, CI 2.04 - Autoimmune serologies negative.  - V/Q scan did not show evidence for chronic PE.  - Echo (6/19): EF 55-60%, mildly dilated RV with moderately decreased systolic function, PASP 83 mmHg, aortic sclerosis without significant stenosis.  - Echo (3/20): EF 55-60%, mild LVH, mild RV dilation with normal systolic function, PASP 31 mmHg.  - Echo (9/20): EF 60-65%, mild RV dilation with normal systolic function, PASP 51 mmHg, normal IVC.   Review of systems complete and found to be negative unless listed in HPI.   Social History   Socioeconomic History  . Marital status: Widowed    Spouse name: Not on file  . Number of children: 2  .  Years of education: Not on file  . Highest education level: Not on file  Occupational History  . Not on file  Social Needs  . Financial resource strain: Not on file  . Food insecurity    Worry: Not on file    Inability: Not on file  . Transportation needs    Medical: Not on file    Non-medical: Not on file  Tobacco Use  . Smoking status: Former Smoker    Packs/day: 0.50    Years: 32.00    Pack years: 16.00    Types: Cigarettes    Quit date: 07/22/1994    Years since quitting: 24.8  . Smokeless tobacco: Never Used  Substance and Sexual Activity  . Alcohol use: No    Alcohol/week: 0.0 standard drinks  . Drug use: No  .  Sexual activity: Not Currently  Lifestyle  . Physical activity    Days per week: Not on file    Minutes per session: Not on file  . Stress: Not on file  Relationships  . Social Herbalist on phone: Not on file    Gets together: Not on file    Attends religious service: Not on file    Active member of club or organization: Not on file    Attends meetings of clubs or organizations: Not on file    Relationship status: Not on file  . Intimate partner violence    Fear of current or ex partner: Not on file    Emotionally abused: Not on file    Physically abused: Not on file    Forced sexual activity: Not on file  Other Topics Concern  . Not on file  Social History Narrative   Married 95 years and has 2 children (2 daughters)   Alcohol Use - no   Former Smoker - she quit 10 years ago, started when she was 69 and has had varying level of use of tobacco products from 1/2 pack to 3 packs per day.   Family History  Problem Relation Age of Onset  . Cancer Mother 33       Breast  . Heart disease Mother   . Hypertension Mother   . Hyperlipidemia Mother   . Heart attack Mother   . Asthma Maternal Grandmother     Current Outpatient Medications  Medication Sig Dispense Refill  . acetaminophen (TYLENOL) 500 MG tablet Take 1,000 mg by mouth every 6 (six) hours as needed for moderate pain.    Marland Kitchen ADCIRCA 20 MG tablet TAKE 2 TABLETS BY MOUTH ONCE DAILY. CALL [phone] FOR REFILLS. 60 tablet 10  . albuterol (PROVENTIL HFA;VENTOLIN HFA) 108 (90 Base) MCG/ACT inhaler Inhale 2 puffs into the lungs every 6 (six) hours as needed for wheezing or shortness of breath. 1 Inhaler 6  . albuterol (PROVENTIL) (2.5 MG/3ML) 0.083% nebulizer solution Take 3 mLs (2.5 mg total) by nebulization every 6 (six) hours as needed for wheezing or shortness of breath. 30 mL 4  . aspirin EC 81 MG tablet Take 81 mg by mouth at bedtime.     Marland Kitchen atenolol (TENORMIN) 25 MG tablet Take 2 tablets by mouth every morning  and take 1 tablet by mouth every evening.    Marland Kitchen BESIVANCE 0.6 % SUSP Place 1 drop into the left eye See admin instructions. INSTILL 1 DROP INTO LEFT 4 TIMES DAILY x 2 DAYS AFTER EYE INJECTION  12  . calcium carbonate (OSCAL) 1500 (600 Ca) MG TABS tablet Take  1,200 mg by mouth daily with breakfast.    . Cholecalciferol (VITAMIN D) 2000 UNITS tablet Take 2,000 Units by mouth every evening.     . Cinnamon 500 MG capsule Take 2,000 mg by mouth daily.      . citalopram (CELEXA) 10 MG tablet TAKE 1 TABLET BY MOUTH EVERY DAY 90 tablet 1  . Coenzyme Q10 200 MG capsule Take 200 mg by mouth at bedtime.     . diphenoxylate-atropine (LOMOTIL) 2.5-0.025 MG tablet Take 1 tablet by mouth 4 (four) times daily as needed for diarrhea or loose stools. 30 tablet 1  . fexofenadine (ALLEGRA) 180 MG tablet Take 180 mg by mouth daily.    . folic acid (FOLVITE) 1 MG tablet Take 1 tablet (1 mg total) by mouth daily. 30 tablet 6  . furosemide (LASIX) 20 MG tablet Take 1 tablet (20 mg total) by mouth daily. 30 tablet 3  . gabapentin (NEURONTIN) 300 MG capsule Take 1 capsule (300 mg total) by mouth at bedtime. 90 capsule 2  . Glucosamine HCl (GLUCOSAMINE PO) Take 1 tablet by mouth every evening.     . hydrochlorothiazide (MICROZIDE) 12.5 MG capsule TAKE 1 CAPSULE BY MOUTH EVERY DAY 90 capsule 3  . ketoconazole (NIZORAL) 2 % cream Apply 1 application topically daily as needed for irritation.    Marland Kitchen ketoconazole (NIZORAL) 2 % shampoo Apply 1 application topically 2 (two) times a week.    . levothyroxine (SYNTHROID) 112 MCG tablet TAKE 1 TABLET BY MOUTH DAILY BEFORE BREAKFAST. 90 tablet 1  . nystatin (MYCOSTATIN) 100000 UNIT/ML suspension Take 5 mLs (500,000 Units total) by mouth 4 (four) times daily. 60 mL 0  . Omega-3 Fatty Acids (FISH OIL) 1200 MG CAPS Take 1,200 mg by mouth daily.     Marland Kitchen omeprazole (PRILOSEC) 40 MG capsule TAKE 1 CAPSULE (40 MG TOTAL) BY MOUTH DAILY. 90 capsule 1  . potassium chloride (K-DUR) 10 MEQ tablet Take  1 tablet (10 mEq total) by mouth daily. 30 tablet 3  . predniSONE (DELTASONE) 5 MG tablet Take 5 mg by mouth daily with breakfast.    . rosuvastatin (CRESTOR) 20 MG tablet TAKE 1 TABLET (20 MG TOTAL) BY MOUTH DAILY. 90 tablet 2  . Selexipag (UPTRAVI) 800 MCG TABS Take 2 tablets (1,600 mcg total) by mouth 2 (two) times daily. 60 tablet 4  . Spacer/Aero-Holding Chambers (AEROCHAMBER PLUS WITH MASK) inhaler Use as instructed 1 each 2  . sulfamethoxazole-trimethoprim (BACTRIM) 400-80 MG tablet TAKE 1 TABLET BY MOUTH THREE TIMES A WEEK 12 tablet 3  . SYMBICORT 160-4.5 MCG/ACT inhaler TAKE 2 PUFFS BY MOUTH TWICE A DAY 10.2 Inhaler 0  . traMADol (ULTRAM) 50 MG tablet Take 2 tablets (100 mg total) by mouth every 6 (six) hours as needed for moderate pain or severe pain. 120 tablet 0  . TREXALL 5 MG tablet TAKE 3 TABLETS BY MOUTH ONCE A WEEK CAUTION:CHEMOTHERAPY. PROTECT FROM LIGHT. 12 tablet 3  . vitamin B-12 (CYANOCOBALAMIN) 1000 MCG tablet Take 1,000 mcg by mouth daily.     No current facility-administered medications for this encounter.    BP (!) 104/52   Pulse (!) 58   Wt 79.5 kg (175 lb 3.2 oz)   SpO2 98%   BMI 30.55 kg/m   Wt Readings from Last 3 Encounters:  05/10/19 79.5 kg (175 lb 3.2 oz)  04/22/19 80.6 kg (177 lb 12.8 oz)  10/21/18 78 kg (172 lb)   General: NAD Neck: No JVD, no thyromegaly or thyroid  nodule.  Lungs: Clear to auscultation bilaterally with normal respiratory effort. CV: Nondisplaced PMI.  Heart regular S1/S2, no S3/S4, no murmur.  No peripheral edema.  No carotid bruit.  Normal pedal pulses.  Abdomen: Soft, nontender, no hepatosplenomegaly, no distention.  Skin: Intact without lesions or rashes.  Neurologic: Alert and oriented x 3.  Psych: Normal affect. Extremities: No clubbing or cyanosis.  HEENT: Normal.   Assessment/Plan: 1. Pulmonary hypertension: Cath 4/19 with moderate PAH, very high PVR. Right and left heart filling pressures were low.  PFTs in 3/19 showed  mild restriction with very low DLCO consistent with pulmonary vascular disease.  V/Q scan with no evidence for chronic PE and autoimmune serologies were negative. High resolution CT in 3/19 showed mild fibrotic changes at the lung bases and a degree of emphysema.  I do not think CT findings can explain her PH.  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  Echo 6/19 showed normal LV EF but mildly dilated/moderately dysfunctional RV with severe pulmonary hypertension. 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  As parenchymal disease does not appear particularly bad on CT, we have been trying her on pulmonary vasodilators. She did not tolerate Opsumit.  Now on selexipag and Adcirca but without much symptomatic improvement. However, echoes have shown a fall in PA pressure compared to initial echo (though higher on 9/20 echo than 3/20 echo).  She is not volume overloaded on exam.    - Continue Adcirca 40 mg daily.  - I will have her Uptravi gradually increased to 2400 mcg bid.  - She did not tolerate Opsumi.  - I am going to arrange for repeat RHC.  She has not improved much symptomatically though PA pressure does seem to be lower noninvasively.  If PA pressure or filling pressures are elevated on RHC, this may explain her ongoing exertional dyspnea.  If not, then it is possible symptoms are primarily due to pulmonary parenchymal disease. We discussed risks/benefits of procedure and she agreed.  2. Chronic hypoxemic respiratory failure: She has pulmonary sarcoidosis and emphysema.  She is on home oxygen.  Pulmonary hypertension also likely plays a role in low oxygen saturation.   - Continue home oxygen.  - She is on prednisone and MTX.   3. HTN: BP controlled.  - Continue HCTZ 12.5 mg daily and atenolol.   Followup in 1 month    [name], 05/10/2019

## 2019-05-10 NOTE — Progress Notes (Signed)
6 minute walk: Pt ambulated 109 meters.  Walk discontinued after 3 and half minutes for fatigue and shortness of breath. O2 started at 98% on 6 Liters and at 3 and half minutes was 77%.  HR range 62-88.  Pt recovered in room to 99% 6L and 56 HR. Pt reports feeling better after sitting.

## 2019-05-10 NOTE — Progress Notes (Signed)
Order for Uptravi sent to [facility], message also sent to [facility] rep.

## 2019-05-11 ENCOUNTER — Other Ambulatory Visit (HOSPITAL_COMMUNITY): Payer: Self-pay

## 2019-05-11 MED ORDER — UPTRAVI 800 MCG PO TABS: 2.0000 | ORAL_TABLET | Freq: Two times a day (BID) | ORAL | 4 refills | Status: DC

## 2019-05-14 ENCOUNTER — Other Ambulatory Visit: Payer: Self-pay | Admitting: Family Medicine

## 2019-05-16 NOTE — Telephone Encounter (Signed)
RF request for Symbicort  LOV: 09/14/2018 (was to F/U around 01/2019 and did not) Next ov: Not scheduled  Last written:04/21/2019  Please advise

## 2019-05-16 NOTE — Telephone Encounter (Signed)
She needs an appointment for further refills but did refill

## 2019-05-17 DIAGNOSIS — I2721 Secondary pulmonary arterial hypertension: Secondary | ICD-10-CM | POA: Diagnosis not present

## 2019-05-17 DIAGNOSIS — I10 Essential (primary) hypertension: Secondary | ICD-10-CM | POA: Diagnosis not present

## 2019-05-17 DIAGNOSIS — I272 Pulmonary hypertension, unspecified: Secondary | ICD-10-CM | POA: Diagnosis not present

## 2019-05-17 DIAGNOSIS — J309 Allergic rhinitis, unspecified: Secondary | ICD-10-CM | POA: Diagnosis not present

## 2019-05-17 DIAGNOSIS — Z79899 Other long term (current) drug therapy: Secondary | ICD-10-CM | POA: Diagnosis not present

## 2019-05-17 DIAGNOSIS — R0602 Shortness of breath: Secondary | ICD-10-CM | POA: Diagnosis not present

## 2019-05-17 DIAGNOSIS — Z87891 Personal history of nicotine dependence: Secondary | ICD-10-CM | POA: Diagnosis not present

## 2019-05-17 DIAGNOSIS — J45909 Unspecified asthma, uncomplicated: Secondary | ICD-10-CM | POA: Diagnosis not present

## 2019-05-17 DIAGNOSIS — J449 Chronic obstructive pulmonary disease, unspecified: Secondary | ICD-10-CM | POA: Diagnosis not present

## 2019-05-17 DIAGNOSIS — D860 Sarcoidosis of lung: Secondary | ICD-10-CM | POA: Diagnosis not present

## 2019-05-17 DIAGNOSIS — J8410 Pulmonary fibrosis, unspecified: Secondary | ICD-10-CM | POA: Diagnosis not present

## 2019-05-17 DIAGNOSIS — R911 Solitary pulmonary nodule: Secondary | ICD-10-CM | POA: Diagnosis not present

## 2019-05-17 DIAGNOSIS — R0600 Dyspnea, unspecified: Secondary | ICD-10-CM | POA: Diagnosis not present

## 2019-05-17 DIAGNOSIS — J439 Emphysema, unspecified: Secondary | ICD-10-CM | POA: Diagnosis not present

## 2019-05-20 ENCOUNTER — Other Ambulatory Visit: Payer: Self-pay | Admitting: Family Medicine

## 2019-05-24 ENCOUNTER — Other Ambulatory Visit (HOSPITAL_COMMUNITY)
Admission: RE | Admit: 2019-05-24 | Discharge: 2019-05-24 | Disposition: A | Discharge status: Home / Self Care | Payer: Medicare Other | Source: Ambulatory Visit | Attending: Cardiology | Admitting: Cardiology

## 2019-05-24 ENCOUNTER — Other Ambulatory Visit (HOSPITAL_COMMUNITY): Payer: Self-pay

## 2019-05-24 DIAGNOSIS — Z20828 Contact with and (suspected) exposure to other viral communicable diseases: Secondary | ICD-10-CM | POA: Diagnosis not present

## 2019-05-24 DIAGNOSIS — Z01812 Encounter for preprocedural laboratory examination: Secondary | ICD-10-CM | POA: Insufficient documentation

## 2019-05-24 MED ORDER — UPTRAVI 800 MCG PO TABS: 2.0000 | ORAL_TABLET | Freq: Two times a day (BID) | ORAL | 4 refills | Status: DC

## 2019-05-25 LAB — NOVEL CORONAVIRUS, NAA (HOSP ORDER, SEND-OUT TO REF LAB; TAT 18-24 HRS): SARS-CoV-2, NAA: NOT DETECTED

## 2019-05-26 ENCOUNTER — Telehealth (HOSPITAL_COMMUNITY): Payer: Self-pay

## 2019-05-26 NOTE — Telephone Encounter (Signed)
Addendum: pt does note she has been quarantined since covid screen

## 2019-05-26 NOTE — Telephone Encounter (Signed)
Pt called to say that she developed a bad cough overnight. She is planned for rhc tomorrow.  She tested negative for covid 10/20. She is asking if procedure needs to be cancelled.  Per Dr Aundra Dubin patient can proceed with the [facility] and he recommended Robitussin  DM for cough. Advised if cough worsens to call office back. Verbalized understanding.

## 2019-05-27 ENCOUNTER — Ambulatory Visit (HOSPITAL_COMMUNITY)
Admission: RE | Admit: 2019-05-27 | Discharge: 2019-05-27 | Disposition: A | Discharge status: Home / Self Care | Payer: Medicare Other | Attending: Cardiology | Admitting: Cardiology

## 2019-05-27 ENCOUNTER — Other Ambulatory Visit: Payer: Self-pay

## 2019-05-27 ENCOUNTER — Encounter (HOSPITAL_COMMUNITY)
Admission: RE | Disposition: A | Discharge status: Home / Self Care | Payer: Self-pay | Source: Home / Self Care | Attending: Cardiology

## 2019-05-27 DIAGNOSIS — I272 Pulmonary hypertension, unspecified: Secondary | ICD-10-CM

## 2019-05-27 DIAGNOSIS — J9611 Chronic respiratory failure with hypoxia: Secondary | ICD-10-CM | POA: Insufficient documentation

## 2019-05-27 DIAGNOSIS — E039 Hypothyroidism, unspecified: Secondary | ICD-10-CM | POA: Insufficient documentation

## 2019-05-27 DIAGNOSIS — Z7982 Long term (current) use of aspirin: Secondary | ICD-10-CM | POA: Insufficient documentation

## 2019-05-27 DIAGNOSIS — Z87891 Personal history of nicotine dependence: Secondary | ICD-10-CM | POA: Insufficient documentation

## 2019-05-27 DIAGNOSIS — Z7951 Long term (current) use of inhaled steroids: Secondary | ICD-10-CM | POA: Diagnosis not present

## 2019-05-27 DIAGNOSIS — Z79899 Other long term (current) drug therapy: Secondary | ICD-10-CM | POA: Diagnosis not present

## 2019-05-27 DIAGNOSIS — J439 Emphysema, unspecified: Secondary | ICD-10-CM | POA: Diagnosis not present

## 2019-05-27 DIAGNOSIS — Z79890 Hormone replacement therapy (postmenopausal): Secondary | ICD-10-CM | POA: Insufficient documentation

## 2019-05-27 DIAGNOSIS — D860 Sarcoidosis of lung: Secondary | ICD-10-CM | POA: Insufficient documentation

## 2019-05-27 DIAGNOSIS — Z825 Family history of asthma and other chronic lower respiratory diseases: Secondary | ICD-10-CM | POA: Insufficient documentation

## 2019-05-27 DIAGNOSIS — E785 Hyperlipidemia, unspecified: Secondary | ICD-10-CM | POA: Insufficient documentation

## 2019-05-27 DIAGNOSIS — Z8249 Family history of ischemic heart disease and other diseases of the circulatory system: Secondary | ICD-10-CM | POA: Diagnosis not present

## 2019-05-27 HISTORY — PX: RIGHT HEART CATH: CATH118263

## 2019-05-27 SURGERY — RIGHT HEART CATH
Anesthesia: LOCAL

## 2019-05-27 MED ORDER — LABETALOL HCL 5 MG/ML IV SOLN: 10.0000 mg | INTRAVENOUS | Status: DC | PRN

## 2019-05-27 MED ORDER — LIDOCAINE HCL (PF) 1 % IJ SOLN
INTRAMUSCULAR | Status: DC | PRN
  Administered 2019-05-27: 2 mL via INTRADERMAL

## 2019-05-27 MED ORDER — HYDRALAZINE HCL 20 MG/ML IJ SOLN: 10.0000 mg | INTRAMUSCULAR | Status: DC | PRN

## 2019-05-27 MED ORDER — ONDANSETRON HCL 4 MG/2ML IJ SOLN: 4.0000 mg | Freq: Four times a day (QID) | INTRAMUSCULAR | Status: DC | PRN

## 2019-05-27 MED ORDER — SODIUM CHLORIDE 0.9% FLUSH: 3.0000 mL | Freq: Two times a day (BID) | INTRAVENOUS | Status: DC

## 2019-05-27 MED ORDER — ACETAMINOPHEN 325 MG PO TABS: 650.0000 mg | ORAL_TABLET | ORAL | Status: DC | PRN

## 2019-05-27 MED ORDER — SODIUM CHLORIDE 0.9 % IV SOLN: 250.0000 mL | INTRAVENOUS | Status: DC | PRN

## 2019-05-27 MED ORDER — LIDOCAINE HCL (PF) 1 % IJ SOLN
INTRAMUSCULAR | Status: AC
  Filled 2019-05-27: qty 30

## 2019-05-27 MED ORDER — SODIUM CHLORIDE 0.9% FLUSH: 3.0000 mL | INTRAVENOUS | Status: DC | PRN

## 2019-05-27 MED ORDER — ASPIRIN 81 MG PO CHEW: 81.0000 mg | CHEWABLE_TABLET | ORAL | Status: DC

## 2019-05-27 MED ORDER — HEPARIN (PORCINE) IN NACL 1000-0.9 UT/500ML-% IV SOLN
INTRAVENOUS | Status: DC | PRN
  Administered 2019-05-27: 500 mL

## 2019-05-27 MED ORDER — HEPARIN (PORCINE) IN NACL 1000-0.9 UT/500ML-% IV SOLN
INTRAVENOUS | Status: AC
  Filled 2019-05-27: qty 500

## 2019-05-27 MED ORDER — SODIUM CHLORIDE 0.9 % IV SOLN: INTRAVENOUS | Status: DC

## 2019-05-27 SURGICAL SUPPLY — 4 items
CATH BALLN WEDGE 5F 110CM (CATHETERS) ×1 IMPLANT
PACK CARDIAC CATHETERIZATION (CUSTOM PROCEDURE TRAY) ×2 IMPLANT
SHEATH GLIDE SLENDER 4/5FR (SHEATH) ×1 IMPLANT
TRANSDUCER W/STOPCOCK (MISCELLANEOUS) ×2 IMPLANT

## 2019-05-27 NOTE — Interval H&P Note (Signed)
History and Physical Interval Note:  05/27/2019 12:58 PM  Kimberly Irwin  has presented today for surgery, with the diagnosis of hp.  The various methods of treatment have been discussed with the patient and family. After consideration of risks, benefits and other options for treatment, the patient has consented to  Procedure(s): RIGHT HEART CATH (N/A) as a surgical intervention.  The patient's history has been reviewed, patient examined, no change in status, stable for surgery.  I have reviewed the patient's chart and labs.  Questions were answered to the patient's satisfaction.     Kimberly Irwin [employer]

## 2019-05-27 NOTE — Discharge Instructions (Signed)
[place]  This sheet gives you information about how to care for yourself after your procedure. Your health care provider may also give you more specific instructions. If you have problems or questions, contact your health care provider. What can I expect after the procedure? After the procedure, it is common to have:  Bruising and tenderness at the catheter insertion area. Follow these instructions at home: Medicines  Take over-the-counter and prescription medicines only as told by your health care provider. Insertion site care  Follow instructions from your health care provider about how to take care of your insertion site. Make sure you: ? Wash your hands with soap and water before you change your bandage (dressing). If soap and water are not available, use hand sanitizer. ? Change your dressing as told by your health care provider. ? Leave stitches (sutures), skin glue, or adhesive strips in place. These skin closures may need to stay in place for 2 weeks or longer. If adhesive strip edges start to loosen and curl up, you may trim the loose edges. Do not remove adhesive strips completely unless your health care provider tells you to do that.  Check your insertion site every day for signs of infection. Check for: ? Redness, swelling, or pain. ? Fluid or blood. ? Pus or a bad smell. ? Warmth.  Do not take baths, swim, or use a hot tub until your health care provider approves.  You may shower 24-48 hours after the procedure, or as directed by your health care provider. ? Remove the dressing and gently wash the site with plain soap and water. ? Pat the area dry with a clean towel. ? Do not rub the site. That could cause bleeding.  Do not apply powder or lotion to the site. Activity   For 24 hours after the procedure, or as directed by your health care provider: ? Do not flex or bend the affected arm. ? Do not push or pull heavy objects with the affected arm. ? Do not  drive yourself home from the hospital or clinic. You may drive 24 hours after the procedure unless your health care provider tells you not to. ? Do not operate machinery or power tools.  Do not lift anything that is heavier than 10 lb (4.5 kg), or the limit that you are told, until your health care provider says that it is safe.  Ask your health care provider when it is okay to: ? Return to work or school. ? Resume usual physical activities or sports. ? Resume sexual activity. General instructions  If the catheter site starts to bleed, raise your arm and put firm pressure on the site. If the bleeding does not stop, get help right away. This is a medical emergency.  If you went home on the same day as your procedure, a responsible adult should be with you for the first 24 hours after you arrive home.  Keep all follow-up visits as told by your health care provider. This is important. Contact a health care provider if:  You have a fever.  You have redness, swelling, or yellow drainage around your insertion site. Get help right away if:  You have unusual pain at the radial site.  The catheter insertion area swells very fast.  The insertion area is bleeding, and the bleeding does not stop when you hold steady pressure on the area.  Your arm or hand becomes pale, cool, tingly, or numb. These symptoms may represent a serious problem  that is an emergency. Do not wait to see if the symptoms will go away. Get medical help right away. Call your local emergency services (911 in the [place] Do not drive yourself to the hospital. Summary  After the procedure, it is common to have bruising and tenderness at the site.  Follow instructions from your health care provider about how to take care of your   brachial site wound. Check the wound every day for signs of infection.  Do not lift anything that is heavier than 10 lb (4.5 kg), or the limit that you are told, until your health care provider  says that it is safe. This information is not intended to replace advice given to you by your health care provider. Make sure you discuss any questions you have with your health care provider. Document Released: 08/23/2010 Document Revised: 08/26/2017 Document Reviewed: 08/26/2017 Elsevier Patient Education  2020 [employer].

## 2019-05-27 NOTE — Research (Signed)
PHDE Informed Consent   Subject Name: Kimberly Irwin  Subject met inclusion and exclusion criteria.  The informed consent form, study requirements and expectations were reviewed with the subject and questions and concerns were addressed prior to the signing of the consent form.  The subject verbalized understanding of the trail requirements.  The subject agreed to participate in the PHDE trial and signed the informed consent.  The informed consent was obtained prior to performance of any protocol-specific procedures for the subject.  A copy of the signed informed consent was given to the subject and a copy was placed in the subject's medical record.  [name] 05/27/2019, 11:22 AM

## 2019-05-30 ENCOUNTER — Encounter (HOSPITAL_COMMUNITY): Payer: Self-pay | Admitting: Cardiology

## 2019-05-30 ENCOUNTER — Telehealth (HOSPITAL_COMMUNITY): Payer: Self-pay

## 2019-05-30 NOTE — Telephone Encounter (Signed)
Message sent to [facility] to facilitate uptitration of Uptravi to max dose 2444mg BID.  Awaiting response

## 2019-05-30 NOTE — Telephone Encounter (Signed)
-----  Message from Larey Dresser, MD sent at 05/28/2019  9:53 AM EDT ----- Regarding: RE: Yes ----- Message ----- From: Valeda Malm, RN Sent: 05/27/2019   4:22 PM EDT To: Larey Dresser, MD, Philicia R Branch, CMA  Do you want her titrated up to max dose of 2400 mcg bid? ----- Message ----- From: Larey Dresser, MD Sent: 05/27/2019   1:26 PM EDT To: Valeda Malm, RN, Philicia R Branch, CMA  Kimberly Irwin says that she needs a prescription for higher dose of Uptravi (up to 2400 mcg bid), probably need to talk to the rep for Uptravi to figure out what is needed.

## 2019-05-31 NOTE — Telephone Encounter (Signed)
Response received from [facility] rep.  Rx sent to be faxed by MD.  Received faxed, signed by MD and faxed back to [facility]. Confirmation received.

## 2019-06-02 ENCOUNTER — Encounter (INDEPENDENT_AMBULATORY_CARE_PROVIDER_SITE_OTHER): Payer: Medicare Other | Admitting: Ophthalmology

## 2019-06-02 DIAGNOSIS — H35033 Hypertensive retinopathy, bilateral: Secondary | ICD-10-CM

## 2019-06-02 DIAGNOSIS — H43813 Vitreous degeneration, bilateral: Secondary | ICD-10-CM | POA: Diagnosis not present

## 2019-06-02 DIAGNOSIS — H34832 Tributary (branch) retinal vein occlusion, left eye, with macular edema: Secondary | ICD-10-CM

## 2019-06-02 DIAGNOSIS — I10 Essential (primary) hypertension: Secondary | ICD-10-CM | POA: Diagnosis not present

## 2019-06-03 DIAGNOSIS — R911 Solitary pulmonary nodule: Secondary | ICD-10-CM | POA: Diagnosis not present

## 2019-06-05 ENCOUNTER — Other Ambulatory Visit: Payer: Self-pay | Admitting: Family Medicine

## 2019-06-06 ENCOUNTER — Other Ambulatory Visit: Payer: Self-pay | Admitting: Family Medicine

## 2019-06-06 ENCOUNTER — Telehealth: Payer: Self-pay | Admitting: Critical Care Medicine

## 2019-06-06 NOTE — Telephone Encounter (Signed)
Per chart, pt's Adcirca is filled by cardiologist Dr. Aundra Dubin, not this office. lmtcb X1 for pt to make aware that she needs to contact cardiology for this refill.

## 2019-06-07 ENCOUNTER — Other Ambulatory Visit (HOSPITAL_COMMUNITY): Payer: Self-pay | Admitting: *Deleted

## 2019-06-07 MED ORDER — TADALAFIL (PAH) 20 MG PO TABS: 40.0000 mg | ORAL_TABLET | Freq: Every day | ORAL | 3 refills | Status: DC

## 2019-06-08 NOTE — Telephone Encounter (Signed)
It looks like Dr Aundra Dubin refilled this for her on yesterday 11.3.2020 #60 with 3 refills.  Called [facility] and spoke with Ebony Hail - they did receive the Rx, it is in process, had to be ordered.  $6.01 copay and should be ready by about 3pm today.  Called spoke with patient, she is aware of the above.  Nothing further needed at this time; will sign off.

## 2019-06-11 DIAGNOSIS — Z1231 Encounter for screening mammogram for malignant neoplasm of breast: Secondary | ICD-10-CM | POA: Diagnosis not present

## 2019-06-11 DIAGNOSIS — Z803 Family history of malignant neoplasm of breast: Secondary | ICD-10-CM | POA: Diagnosis not present

## 2019-06-11 LAB — HM MAMMOGRAPHY

## 2019-06-13 ENCOUNTER — Ambulatory Visit (HOSPITAL_COMMUNITY)
Admission: RE | Admit: 2019-06-13 | Discharge: 2019-06-13 | Disposition: A | Discharge status: Home / Self Care | Payer: Medicare Other | Source: Ambulatory Visit | Attending: Cardiology | Admitting: Cardiology

## 2019-06-13 ENCOUNTER — Other Ambulatory Visit: Payer: Self-pay

## 2019-06-13 ENCOUNTER — Encounter (HOSPITAL_COMMUNITY): Payer: Self-pay | Admitting: Cardiology

## 2019-06-13 VITALS — BP 96/60 | HR 60 | Wt 177.2 lb

## 2019-06-13 DIAGNOSIS — E039 Hypothyroidism, unspecified: Secondary | ICD-10-CM | POA: Insufficient documentation

## 2019-06-13 DIAGNOSIS — J9611 Chronic respiratory failure with hypoxia: Secondary | ICD-10-CM | POA: Diagnosis not present

## 2019-06-13 DIAGNOSIS — I2510 Atherosclerotic heart disease of native coronary artery without angina pectoris: Secondary | ICD-10-CM | POA: Diagnosis not present

## 2019-06-13 DIAGNOSIS — Z803 Family history of malignant neoplasm of breast: Secondary | ICD-10-CM | POA: Diagnosis not present

## 2019-06-13 DIAGNOSIS — Z825 Family history of asthma and other chronic lower respiratory diseases: Secondary | ICD-10-CM | POA: Diagnosis not present

## 2019-06-13 DIAGNOSIS — Z8349 Family history of other endocrine, nutritional and metabolic diseases: Secondary | ICD-10-CM | POA: Diagnosis not present

## 2019-06-13 DIAGNOSIS — J439 Emphysema, unspecified: Secondary | ICD-10-CM | POA: Insufficient documentation

## 2019-06-13 DIAGNOSIS — I272 Pulmonary hypertension, unspecified: Secondary | ICD-10-CM

## 2019-06-13 DIAGNOSIS — Z79890 Hormone replacement therapy (postmenopausal): Secondary | ICD-10-CM | POA: Insufficient documentation

## 2019-06-13 DIAGNOSIS — I5032 Chronic diastolic (congestive) heart failure: Secondary | ICD-10-CM

## 2019-06-13 DIAGNOSIS — E785 Hyperlipidemia, unspecified: Secondary | ICD-10-CM | POA: Diagnosis not present

## 2019-06-13 DIAGNOSIS — Z79899 Other long term (current) drug therapy: Secondary | ICD-10-CM | POA: Insufficient documentation

## 2019-06-13 DIAGNOSIS — Z7952 Long term (current) use of systemic steroids: Secondary | ICD-10-CM | POA: Diagnosis not present

## 2019-06-13 DIAGNOSIS — Z8249 Family history of ischemic heart disease and other diseases of the circulatory system: Secondary | ICD-10-CM | POA: Diagnosis not present

## 2019-06-13 DIAGNOSIS — Z7982 Long term (current) use of aspirin: Secondary | ICD-10-CM | POA: Insufficient documentation

## 2019-06-13 DIAGNOSIS — Z87891 Personal history of nicotine dependence: Secondary | ICD-10-CM | POA: Diagnosis not present

## 2019-06-13 DIAGNOSIS — I10 Essential (primary) hypertension: Secondary | ICD-10-CM | POA: Diagnosis not present

## 2019-06-13 DIAGNOSIS — D860 Sarcoidosis of lung: Secondary | ICD-10-CM | POA: Insufficient documentation

## 2019-06-13 NOTE — Patient Instructions (Addendum)
NO medication changes today  Your physician recommends that you schedule a follow-up appointment in: 3 months with Dr Aundra Dubin. Friday, February 12th, 2020 at 2:20pm. Garage code [id number]  At the [facility], you and your health needs are our priority. As part of our continuing mission to provide you with exceptional heart care, we have created designated Provider Care Teams. These Care Teams include your primary Cardiologist (physician) and Advanced Practice Providers (APPs- Physician Assistants and Nurse Practitioners) who all work together to provide you with the care you need, when you need it.   You may see any of the following providers on your designated Care Team at your next follow up: Marland Kitchen Dr Glori Bickers . Dr [name] . Darrick Grinder, NP . Lyda Jester, PA   Please be sure to bring in all your medications bottles to every appointment.

## 2019-06-14 ENCOUNTER — Other Ambulatory Visit: Payer: Self-pay | Admitting: Family Medicine

## 2019-06-14 NOTE — Progress Notes (Signed)
PCP: Dr. Charlett Blake Pulmonary: Dr. Lake Bells Cardiology: Dr. Aundra Dubin  73 y.o. with history of sarcoidosis and emphysema was referred by Dr Lake Bells for evaluation of pulmonary hypertension.  She has a diagnosis of sarcoidosis, but CT chest in 3/19 did not show marked parenchymal disease.  PFTs showed very low DLCO.  She had RHC done in 4/19 with moderate pulmonary hypertension and very high PVR.    She is now wearing up to 6L home oxygen by  at all times.  She does not smoke, says she quit > 20 years ago.    Echo in 6/19 showed EF 55-60%, mildly dilated RV with moderately decreased systolic function, PASP 83 mmHg, aortic sclerosis without significant stenosis.   She did not tolerate Opsumit.   Echo in 3/20 showed EF 55-60% with mild LVH, mild RV dilation with normal systolic function, PASP 31 mmHg.  Echo was done again in 9/20, showing EF 60-65%.  I reviewed the study, and think that the RV was mildly dilated with normal systolic function. PASP 51 mmHg. IVC normal.   RHC in 10/20 showed mild pulmonary HTN with mean PA pressure 28 mmHg and PVR 2.75 WU. She is now seeing a new pulmonologist at [facility].  FDG PET study was done, this showed no active sarcoidosis and MTX was stopped.  Trelegy was started.   She returns for f/u of pulmonary HTN.  Wears 4-6 L O2 at all times.  She is on Adcirca and selexipag now, really has not felt better on PH meds.  She has the same dyspnea with walking up stairs or with walking "fast."  No orthopnea/PND.  No chest pain.  No lightheadedness or syncope.    6 minute walk (5/19): 122 m 6 minute walk (9/19):  31 m (only walked for about 1 minute).  6 minute walk (10/20): 109 m  Labs (3/19): LDL 53 Labs (4/19): hgb 8.5, K 4.8, creatinine 1.06 Labs (5/19): anti-SCL70 negative, ANA negative, RF negative, ANCA negative, HIV negative, anti-centromere negative.  Labs (6/19): K 4, creatinine 1.03 Labs (9/19): K 4.6, creatinine 1.18 Labs (10/19): TSH normal, K 4.1, creatinine  1. Labs (2/20): LDL 103, K 4.2, creatinine 0.99 Labs (9/20): K 3.9, creatinine 1.06, BNP 137 Labs (10/20): K 4.6, creatinine 1.05  PMH: 1. Sarcoidosis: On home oxygen.  - PFTs (3/19): FEV1 84%, FVC 84%, ratio 71%, TLC 75%, DLCO 25% - CT (3/19) with mild fibrotic changes in the lung bases.  - FDG PET (10/20): This was not suggestive of active sarcoidosis.  2. Emphysema: Moderate emphysema on CT chest 3/19.  3. CAD: Calcification of coronaries on CT chest in 3/19.  - Cardiolite in 1/19: EF 65%, no ischemia/infarction.  4. HTN 5. Hyperlipidemia 6. Hypothyroidism 7. Anemia 8. Pulmonary hypertension:  - Echo (9/17): EF 60-65%, PASP 44 mmHg.  - RHC (4/19): mean RA 2, PA 66/16 mean 34, mean PCWP 3, PVR 8.3 WU, CI 2.04 - Autoimmune serologies negative.  - V/Q scan did not show evidence for chronic PE.  - Echo (6/19): EF 55-60%, mildly dilated RV with moderately decreased systolic function, PASP 83 mmHg, aortic sclerosis without significant stenosis.  - Echo (3/20): EF 55-60%, mild LVH, mild RV dilation with normal systolic function, PASP 31 mmHg.  - Echo (9/20): EF 60-65%, mild RV dilation with normal systolic function, PASP 51 mmHg, normal IVC.  - RHC (10/20): mean RA 4, PA 50/14 mean 28, mean PCWP 12, CI 3.15, PVR 2.75 WU  Review of systems complete and found to  be negative unless listed in HPI.   Social History   Socioeconomic History  . Marital status: Widowed    Spouse name: Not on file  . Number of children: 2  . Years of education: Not on file  . Highest education level: Not on file  Occupational History  . Not on file  Social Needs  . Financial resource strain: Not on file  . Food insecurity    Worry: Not on file    Inability: Not on file  . Transportation needs    Medical: Not on file    Non-medical: Not on file  Tobacco Use  . Smoking status: Former Smoker    Packs/day: 0.50    Years: 32.00    Pack years: 16.00    Types: Cigarettes    Quit date: 07/22/1994     Years since quitting: 24.9  . Smokeless tobacco: Never Used  Substance and Sexual Activity  . Alcohol use: No    Alcohol/week: 0.0 standard drinks  . Drug use: No  . Sexual activity: Not Currently  Lifestyle  . Physical activity    Days per week: Not on file    Minutes per session: Not on file  . Stress: Not on file  Relationships  . Social Herbalist on phone: Not on file    Gets together: Not on file    Attends religious service: Not on file    Active member of club or organization: Not on file    Attends meetings of clubs or organizations: Not on file    Relationship status: Not on file  . Intimate partner violence    Fear of current or ex partner: Not on file    Emotionally abused: Not on file    Physically abused: Not on file    Forced sexual activity: Not on file  Other Topics Concern  . Not on file  Social History Narrative   Married 49 years and has 2 children (2 daughters)   Alcohol Use - no   Former Smoker - she quit 10 years ago, started when she was 8 and has had varying level of use of tobacco products from 1/2 pack to 3 packs per day.   Family History  Problem Relation Age of Onset  . Cancer Mother 76       Breast  . Heart disease Mother   . Hypertension Mother   . Hyperlipidemia Mother   . Heart attack Mother   . Asthma Maternal Grandmother     Current Outpatient Medications  Medication Sig Dispense Refill  . acetaminophen (TYLENOL) 500 MG tablet Take 1,000 mg by mouth every 6 (six) hours as needed for moderate pain.    Marland Kitchen albuterol (PROVENTIL HFA;VENTOLIN HFA) 108 (90 Base) MCG/ACT inhaler Inhale 2 puffs into the lungs every 6 (six) hours as needed for wheezing or shortness of breath. 1 Inhaler 6  . albuterol (PROVENTIL) (2.5 MG/3ML) 0.083% nebulizer solution Take 3 mLs (2.5 mg total) by nebulization every 6 (six) hours as needed for wheezing or shortness of breath. 30 mL 4  . aspirin EC 81 MG tablet Take 81 mg by mouth at bedtime.     Marland Kitchen  atenolol (TENORMIN) 25 MG tablet Take 25-50 mg by mouth daily. Take 50 mg in the morning and 25 mg in the evening    . BESIVANCE 0.6 % SUSP Place 1 drop into the left eye See admin instructions. Only use : 1 DROP INTO LEFT 4 TIMES DAILY  x 2 DAYS AFTER EYE INJECTION  12  . calcium carbonate (OSCAL) 1500 (600 Ca) MG TABS tablet Take 1,500 mg by mouth daily with breakfast.     . Cholecalciferol (VITAMIN D) 2000 UNITS tablet Take 2,000 Units by mouth every evening.     . Cinnamon 500 MG capsule Take 2,000 mg by mouth daily.      . citalopram (CELEXA) 10 MG tablet TAKE 1 TABLET BY MOUTH EVERY DAY 90 tablet 1  . Coenzyme Q10 200 MG capsule Take 200 mg by mouth at bedtime.     . fexofenadine (ALLEGRA) 180 MG tablet Take 180 mg by mouth daily.    . furosemide (LASIX) 20 MG tablet Take 1 tablet (20 mg total) by mouth daily. 30 tablet 3  . Glucosamine HCl (GLUCOSAMINE PO) Take 1 tablet by mouth every evening.     . hydrochlorothiazide (MICROZIDE) 12.5 MG capsule TAKE 1 CAPSULE BY MOUTH EVERY DAY 90 capsule 3  . ketoconazole (NIZORAL) 2 % cream Apply 1 application topically daily as needed for irritation (face).     Marland Kitchen levothyroxine (SYNTHROID) 112 MCG tablet TAKE 1 TABLET BY MOUTH EVERY DAY BEFORE BREAKFAST 90 tablet 1  . Omega-3 Fatty Acids (FISH OIL) 1200 MG CAPS Take 1,200 mg by mouth daily.     Marland Kitchen omeprazole (PRILOSEC) 40 MG capsule TAKE 1 CAPSULE BY MOUTH EVERY DAY 90 capsule 1  . predniSONE (DELTASONE) 5 MG tablet Take 5 mg by mouth daily with breakfast.    . rosuvastatin (CRESTOR) 20 MG tablet TAKE 1 TABLET (20 MG TOTAL) BY MOUTH DAILY. (Patient taking differently: Take 20 mg by mouth at bedtime. ) 90 tablet 2  . Selexipag (UPTRAVI) 800 MCG TABS Take 2 tablets (1,600 mcg total) by mouth 2 (two) times daily. (Patient taking differently: Take 2 tablets by mouth 2 (two) times daily. Sliding scale) 60 tablet 4  . Spacer/Aero-Holding Chambers (AEROCHAMBER PLUS WITH MASK) inhaler Use as instructed 1 each 2   . tadalafil, PAH, (ADCIRCA) 20 MG tablet Take 40 mg by mouth daily.    . traMADol (ULTRAM) 50 MG tablet Take 2 tablets (100 mg total) by mouth every 6 (six) hours as needed for moderate pain or severe pain. 120 tablet 0  . TRELEGY ELLIPTA 100-62.5-25 MCG/INH AEPB Inhale 1 puff into the lungs daily.    . vitamin B-12 (CYANOCOBALAMIN) 1000 MCG tablet Take 1,000 mcg by mouth daily.    Marland Kitchen gabapentin (NEURONTIN) 300 MG capsule TAKE 1 CAPSULE BY MOUTH EVERYDAY AT BEDTIME 90 capsule 2   No current facility-administered medications for this encounter.    BP 96/60   Pulse 60   Wt 80.4 kg (177 lb 3.2 oz)   SpO2 97%   BMI 30.90 kg/m   Wt Readings from Last 3 Encounters:  06/13/19 80.4 kg (177 lb 3.2 oz)  05/27/19 79.4 kg (175 lb)  05/10/19 79.5 kg (175 lb 3.2 oz)   General: NAD Neck: No JVD, no thyromegaly or thyroid nodule.  Lungs: Distant BS. CV: Nondisplaced PMI.  Heart regular S1/S2, no S3/S4, no murmur.  No peripheral edema.  No carotid bruit.  Normal pedal pulses.  Abdomen: Soft, nontender, no hepatosplenomegaly, no distention.  Skin: Intact without lesions or rashes.  Neurologic: Alert and oriented x 3.  Psych: Normal affect. Extremities: No clubbing or cyanosis.  HEENT: Normal.   Assessment/Plan: 1. Pulmonary hypertension: Cath 4/19 with moderate PAH, very high PVR. Right and left heart filling pressures were low.  PFTs in 3/19  showed mild restriction with very low DLCO consistent with pulmonary vascular disease.  V/Q scan with no evidence for chronic PE and autoimmune serologies were negative. High resolution CT in 3/19 showed mild fibrotic changes at the lung bases and a degree of emphysema.  I do not think CT findings can explain her PH.  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  Echo 6/19 showed normal LV EF but mildly dilated/moderately dysfunctional RV with severe pulmonary hypertension. 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  As parenchymal disease does not appear  particularly bad on CT, we have been trying her on pulmonary vasodilators. She did not tolerate Opsumit.  Now on selexipag and Adcirca but without much symptomatic improvement. However, RHC in 10/20 showed lower PA pressure with PVR 2.75 WU.  She is not volume overloaded on exam.    - Continue Adcirca 40 mg daily.  - I will have her Uptravi gradually increased to 2400 mcg bid, she is up to 1800 mcg bid.  - She did not tolerate Opsumit.  - Given lack of improvement, agree that PVOD is a consideration.  She has followup with pulmonary, may benefit from repeat high resolution CT chest.  2. Chronic hypoxemic respiratory failure: She has pulmonary sarcoidosis and emphysema.  She is on home oxygen.  Pulmonary hypertension also likely plays a role in low oxygen saturation.  FDG PET did not appear to show active pulmonary sarcoidosis so MTX has been stopped.  - Continue home oxygen.    3. HTN: BP controlled.  - Continue HCTZ 12.5 mg daily and atenolol.   Followup in 3 months.   [name], 06/14/2019

## 2019-06-22 ENCOUNTER — Other Ambulatory Visit (HOSPITAL_COMMUNITY): Payer: Self-pay

## 2019-06-22 MED ORDER — UPTRAVI 800 MCG PO TABS: 2.0000 | ORAL_TABLET | Freq: Two times a day (BID) | ORAL | 4 refills | Status: DC

## 2019-06-24 ENCOUNTER — Other Ambulatory Visit (HOSPITAL_COMMUNITY): Payer: Self-pay | Admitting: Cardiology

## 2019-06-24 MED ORDER — UPTRAVI 800 MCG PO TABS: 800.0000 ug | ORAL_TABLET | Freq: Two times a day (BID) | ORAL | 3 refills | Status: DC

## 2019-06-24 MED ORDER — UPTRAVI 1600 MCG PO TABS: 1600.0000 ug | ORAL_TABLET | Freq: Two times a day (BID) | ORAL | 30 refills | Status: DC

## 2019-06-24 NOTE — Telephone Encounter (Signed)
[facility] called to request updated rx for selexipeg  Reports patient has reached dosage of 2400 mcg BID  Insurance will NOT pay for 1200 two tabs BID however they will pay for 1600 mcg and 800 mcg, one tab twice daily

## 2019-06-29 ENCOUNTER — Other Ambulatory Visit: Payer: Self-pay

## 2019-07-05 ENCOUNTER — Telehealth (HOSPITAL_COMMUNITY): Payer: Self-pay | Admitting: Pharmacist

## 2019-07-05 NOTE — Telephone Encounter (Signed)
Patient Advocate Encounter   Received notification from [employer] that prior authorization for Kimberly Irwin is required.   PA submitted via Fax Status is pending   Will continue to follow.  Audry Riles, PharmD, BCPS, BCCP, CPP Heart Failure Clinic Pharmacist [phone]

## 2019-07-06 ENCOUNTER — Telehealth (HOSPITAL_COMMUNITY): Payer: Self-pay | Admitting: Pharmacy Technician

## 2019-07-06 NOTE — Telephone Encounter (Signed)
Advanced Heart Failure Patient Advocate Encounter  Prior Authorization for Uptravi 1275mg (4 tablets) has been approved.    PA# [id number] dates: 07/05/2019 through 08/03/2020  ECharlann Boxer CPhT

## 2019-07-07 MED ORDER — SELEXIPAG 1200 MCG PO TABS: 2400.0000 ug | ORAL_TABLET | Freq: Two times a day (BID) | ORAL | 11 refills | Status: DC

## 2019-07-07 NOTE — Telephone Encounter (Signed)
PA for Uptravi 1200 mcg tablets approved. Updated patient medication list to reflect current dose of 2400 mcg BID. Patient fills [name] at [facility].   Audry Riles, PharmD, BCPS, BCCP, CPP Heart Failure Clinic Pharmacist [phone]

## 2019-07-08 ENCOUNTER — Other Ambulatory Visit: Payer: Self-pay | Admitting: Pulmonary Disease

## 2019-07-14 ENCOUNTER — Encounter (INDEPENDENT_AMBULATORY_CARE_PROVIDER_SITE_OTHER): Payer: Medicare Other | Admitting: Ophthalmology

## 2019-07-14 IMAGING — DX DG CHEST 1V PORT
1 series · 1 of 1 positions shown · non-contrast
Comparison: 01/25/2018

CLINICAL DATA: Shortness of breath

EXAM:
PORTABLE CHEST 1 VIEW

[chest ap]
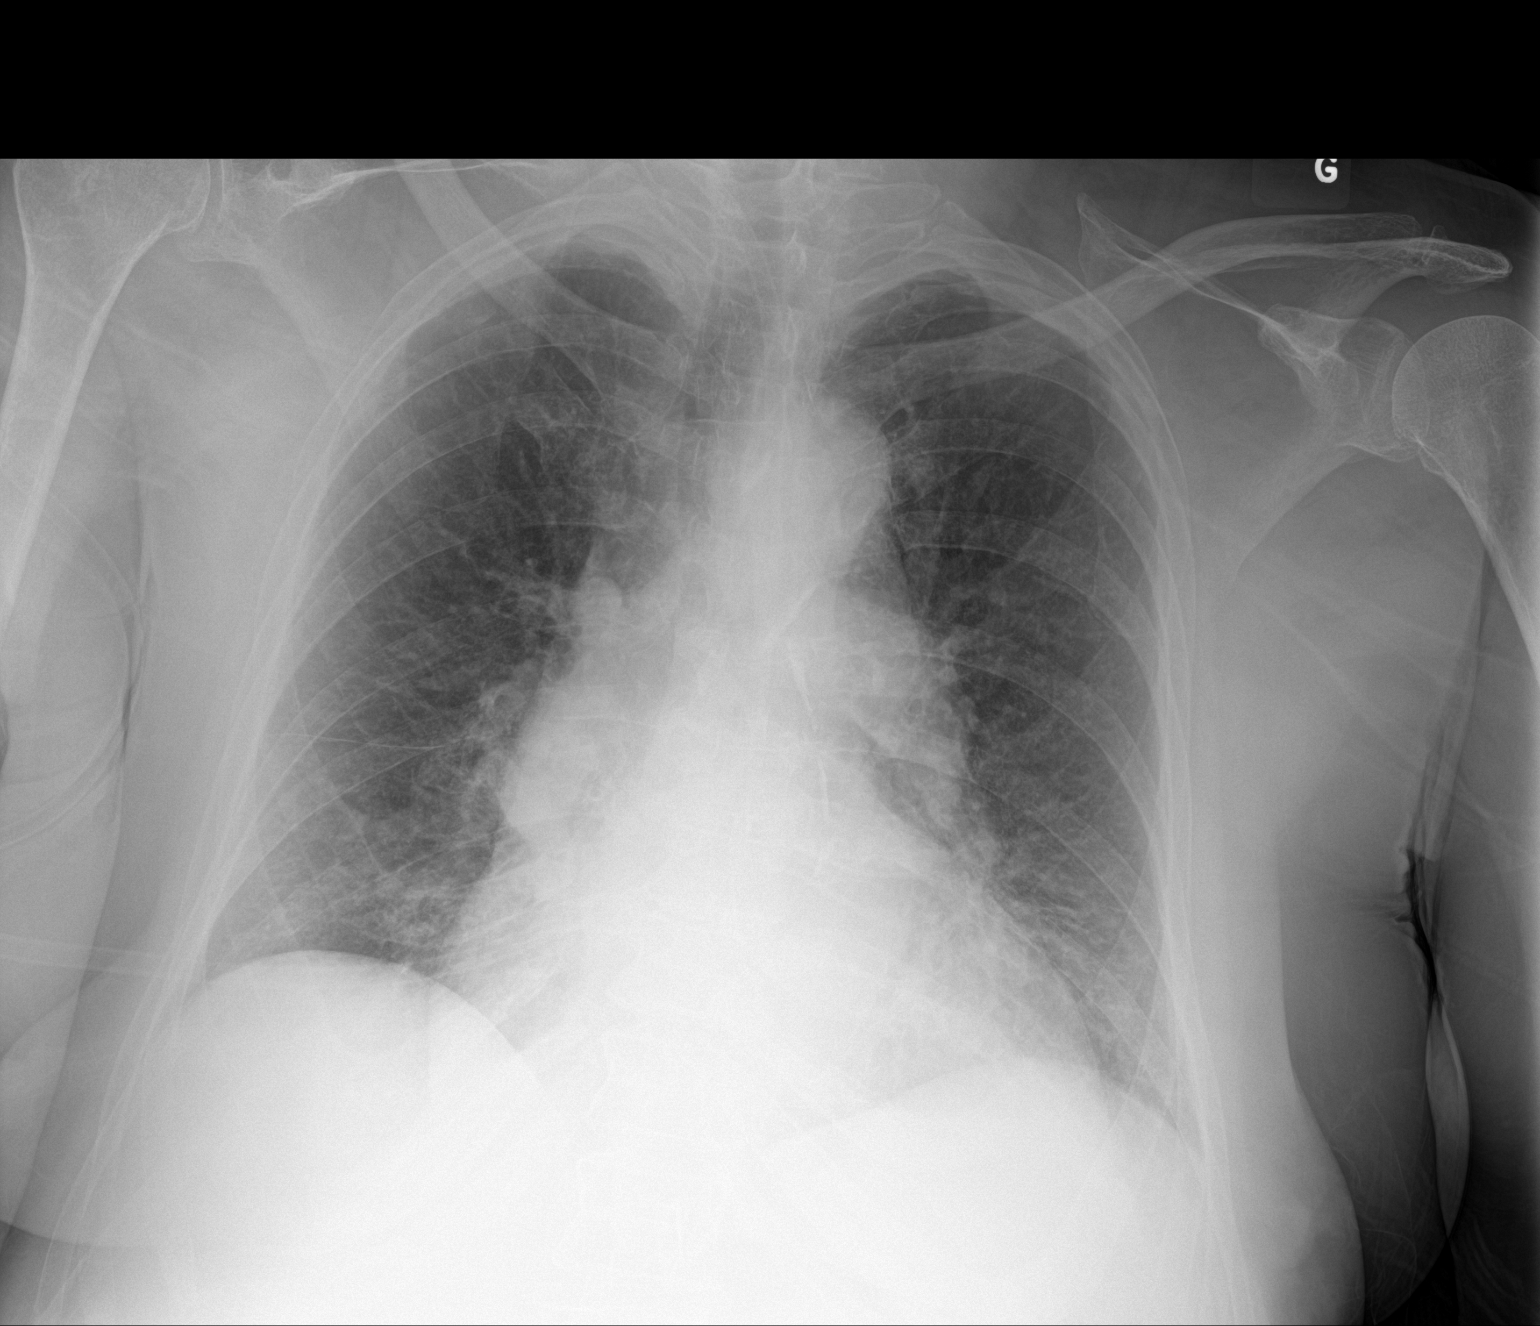

[1 of 1 positions shown; findings below may reference images not displayed]

FINDINGS: Cardiac shadow remains enlarged. The lungs are well aerated
bilaterally. No focal infiltrate or sizable effusion is seen. No
bony abnormality is noted.
IMPRESSION: No active disease.

## 2019-07-15 IMAGING — DX DG CHEST 1V PORT
1 series · 1 of 1 positions shown · non-contrast
Comparison: 01/26/2018

CLINICAL DATA: Short of breath

EXAM:
PORTABLE CHEST 1 VIEW

[chest ap]
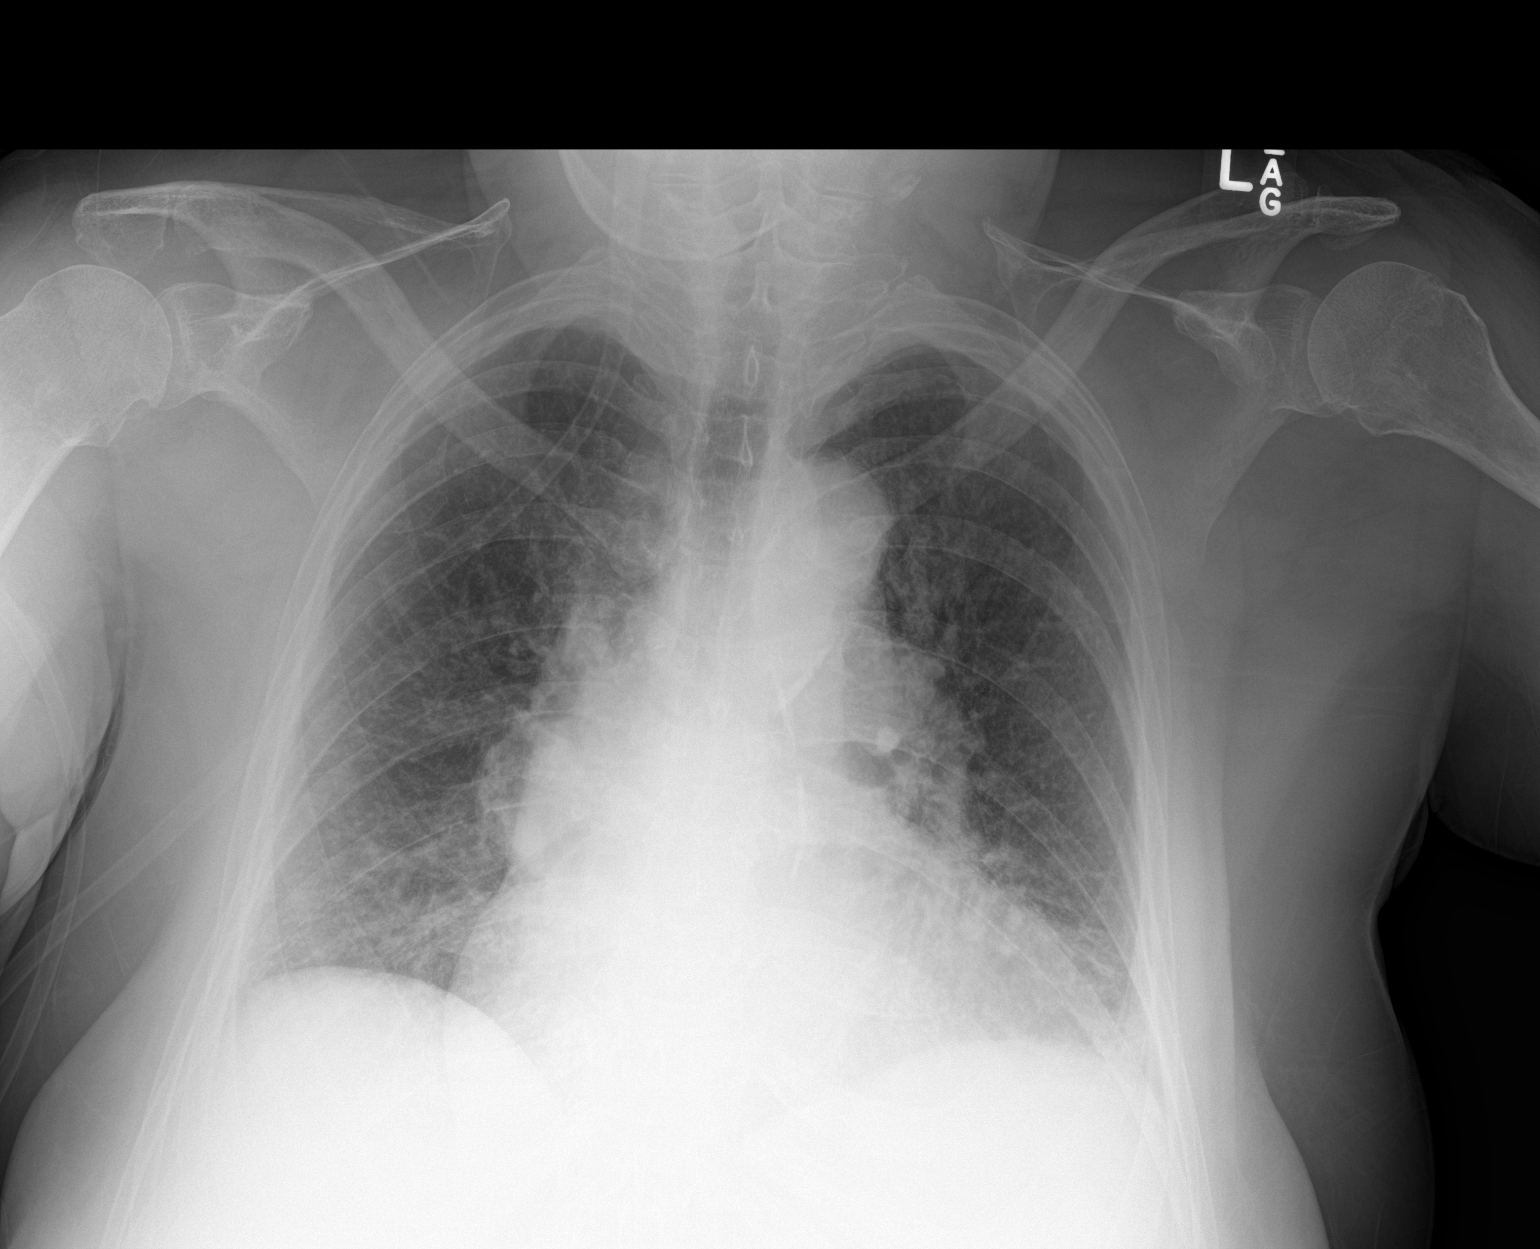

[1 of 1 positions shown; findings below may reference images not displayed]

FINDINGS: Moderate cardiomegaly. Mild vascular congestion without pulmonary
edema. Bibasilar atelectasis. Low lung volumes. No pneumothorax.
IMPRESSION: Mild vascular congestion.  No evidence of pulmonary edema.

## 2019-07-18 ENCOUNTER — Encounter: Payer: Self-pay | Admitting: Family Medicine

## 2019-07-19 ENCOUNTER — Other Ambulatory Visit: Payer: Self-pay | Admitting: Critical Care Medicine

## 2019-07-19 ENCOUNTER — Telehealth (HOSPITAL_COMMUNITY): Payer: Self-pay | Admitting: Pharmacy Technician

## 2019-07-19 NOTE — Telephone Encounter (Signed)
Received message about re-enrollment for Adcirca in the ASSIST patient assistance program.  Filled out patient application, called patient to obtain signature. Left message asking her to call back.   Also, there was a question between whether the patient should be receiving brand v generic. Called Andee Poles [phone]) at [employer] back, left her a message to return my call. Want to make sure that we put her on whichever is more affordable in the new year.  Will follow up.  Charlann Boxer, CPhT

## 2019-07-20 NOTE — Telephone Encounter (Signed)
Dr. Carlis Abbott, please advise if you are okay with [place] refilling meds for pt.

## 2019-07-21 NOTE — Telephone Encounter (Signed)
I would prefer if Cardiology or her PCP managed this since they are more likely doing labs to follow her electrolyes. Thanks!  LPC

## 2019-07-22 ENCOUNTER — Telehealth (HOSPITAL_COMMUNITY): Payer: Self-pay | Admitting: Pharmacist

## 2019-07-22 NOTE — Telephone Encounter (Signed)
Spoke with Kimberly Irwin about re-enrolling in [place] Assist program for Adcirca. Ms. Luo informed me that she has been switched from Adcirca to the generic tadalafil. She is able to fill the tadalafil at [facility] local [facility] and has not had any issues with affordability. She no longer needs patient assistance through [place] and informed them that she no longer needs assistance.   Audry Riles, PharmD, BCPS, BCCP, CPP Heart Failure Clinic Pharmacist [phone]

## 2019-08-10 ENCOUNTER — Encounter (INDEPENDENT_AMBULATORY_CARE_PROVIDER_SITE_OTHER): Payer: Medicare Other | Admitting: Ophthalmology

## 2019-08-10 ENCOUNTER — Telehealth (HOSPITAL_COMMUNITY): Payer: Self-pay | Admitting: Pharmacist

## 2019-08-10 DIAGNOSIS — H34832 Tributary (branch) retinal vein occlusion, left eye, with macular edema: Secondary | ICD-10-CM | POA: Diagnosis not present

## 2019-08-10 DIAGNOSIS — H43813 Vitreous degeneration, bilateral: Secondary | ICD-10-CM

## 2019-08-10 DIAGNOSIS — H35033 Hypertensive retinopathy, bilateral: Secondary | ICD-10-CM | POA: Diagnosis not present

## 2019-08-10 DIAGNOSIS — I10 Essential (primary) hypertension: Secondary | ICD-10-CM | POA: Diagnosis not present

## 2019-08-10 NOTE — Telephone Encounter (Signed)
Patient Advocate Encounter   Received notification from [employer] that prior authorization for Tadalafil is required.   PA submitted on CoverMyMeds Key [id number] Status is pending   Will continue to follow.  Audry Riles, PharmD, BCPS, BCCP, CPP Heart Failure Clinic Pharmacist [phone]

## 2019-08-10 NOTE — Telephone Encounter (Signed)
Advanced Heart Failure Patient Advocate Encounter  Prior Authorization for Tadalafil has been approved.    PA# [id number] Effective dates: 08/10/19 through 08/03/20  Patients co-pay is $68.74  Added patient to [employer] waitlist for copay assistance.    Audry Riles, PharmD, BCPS, BCCP, CPP Heart Failure Clinic Pharmacist [phone]

## 2019-08-11 ENCOUNTER — Other Ambulatory Visit (HOSPITAL_COMMUNITY): Payer: Self-pay

## 2019-08-11 NOTE — Telephone Encounter (Signed)
Opened in error

## 2019-09-08 ENCOUNTER — Other Ambulatory Visit: Payer: Self-pay | Admitting: Family Medicine

## 2019-09-13 ENCOUNTER — Ambulatory Visit (HOSPITAL_COMMUNITY)
Admission: RE | Admit: 2019-09-13 | Discharge: 2019-09-13 | Disposition: A | Discharge status: Home / Self Care | Payer: Medicare Other | Source: Ambulatory Visit | Attending: Cardiology | Admitting: Cardiology

## 2019-09-13 ENCOUNTER — Encounter (HOSPITAL_COMMUNITY): Payer: Self-pay | Admitting: Cardiology

## 2019-09-13 ENCOUNTER — Other Ambulatory Visit: Payer: Self-pay

## 2019-09-13 ENCOUNTER — Encounter (HOSPITAL_COMMUNITY): Payer: Medicare Other | Admitting: Cardiology

## 2019-09-13 VITALS — BP 120/64 | HR 53 | Wt 177.6 lb

## 2019-09-13 DIAGNOSIS — J439 Emphysema, unspecified: Secondary | ICD-10-CM | POA: Insufficient documentation

## 2019-09-13 DIAGNOSIS — Z79899 Other long term (current) drug therapy: Secondary | ICD-10-CM | POA: Insufficient documentation

## 2019-09-13 DIAGNOSIS — E039 Hypothyroidism, unspecified: Secondary | ICD-10-CM | POA: Insufficient documentation

## 2019-09-13 DIAGNOSIS — J9611 Chronic respiratory failure with hypoxia: Secondary | ICD-10-CM | POA: Diagnosis not present

## 2019-09-13 DIAGNOSIS — Z7982 Long term (current) use of aspirin: Secondary | ICD-10-CM | POA: Insufficient documentation

## 2019-09-13 DIAGNOSIS — Z8249 Family history of ischemic heart disease and other diseases of the circulatory system: Secondary | ICD-10-CM | POA: Diagnosis not present

## 2019-09-13 DIAGNOSIS — Z87891 Personal history of nicotine dependence: Secondary | ICD-10-CM | POA: Diagnosis not present

## 2019-09-13 DIAGNOSIS — Z79890 Hormone replacement therapy (postmenopausal): Secondary | ICD-10-CM | POA: Insufficient documentation

## 2019-09-13 DIAGNOSIS — I2510 Atherosclerotic heart disease of native coronary artery without angina pectoris: Secondary | ICD-10-CM | POA: Diagnosis not present

## 2019-09-13 DIAGNOSIS — I10 Essential (primary) hypertension: Secondary | ICD-10-CM | POA: Diagnosis not present

## 2019-09-13 DIAGNOSIS — Z7952 Long term (current) use of systemic steroids: Secondary | ICD-10-CM | POA: Diagnosis not present

## 2019-09-13 DIAGNOSIS — Z8349 Family history of other endocrine, nutritional and metabolic diseases: Secondary | ICD-10-CM | POA: Insufficient documentation

## 2019-09-13 DIAGNOSIS — E785 Hyperlipidemia, unspecified: Secondary | ICD-10-CM | POA: Insufficient documentation

## 2019-09-13 DIAGNOSIS — J849 Interstitial pulmonary disease, unspecified: Secondary | ICD-10-CM

## 2019-09-13 DIAGNOSIS — I272 Pulmonary hypertension, unspecified: Secondary | ICD-10-CM | POA: Insufficient documentation

## 2019-09-13 DIAGNOSIS — D869 Sarcoidosis, unspecified: Secondary | ICD-10-CM | POA: Diagnosis not present

## 2019-09-13 DIAGNOSIS — Z9981 Dependence on supplemental oxygen: Secondary | ICD-10-CM | POA: Diagnosis not present

## 2019-09-13 NOTE — Patient Instructions (Signed)
Labs done today, your results will be available in MyChart, we will contact you for abnormal readings.  High Resolution CT Scan of Chest  You have been referred to Pulmonary, they will call you to schedule   Your physician recommends that you schedule a follow-up appointment in: 3 months  If you have any questions or concerns before your next appointment please send [place] a message through [place] or call our office at [phone].  At the [facility], you and your health needs are our priority. As part of our continuing mission to provide you with exceptional heart care, we have created designated Provider Care Teams. These Care Teams include your primary Cardiologist (physician) and Advanced Practice Providers (APPs- Physician Assistants and Nurse Practitioners) who all work together to provide you with the care you need, when you need it.   You may see any of the following providers on your designated Care Team at your next follow up: Marland Kitchen Dr Glori Bickers . Dr [name] . Darrick Grinder, NP . Lyda Jester, PA . Audry Riles, PharmD   Please be sure to bring in all your medications bottles to every appointment.

## 2019-09-14 ENCOUNTER — Telehealth (HOSPITAL_COMMUNITY): Payer: Self-pay | Admitting: Vascular Surgery

## 2019-09-14 NOTE — Telephone Encounter (Signed)
Pt message giving CT appt 09/23/19 @ 4pm, arrival time 3:45, asked pt to call back to confirm appt

## 2019-09-14 NOTE — Progress Notes (Signed)
PCP: Dr. Charlett Blake Pulmonary: Dr. Lake Bells Cardiology: Dr. Aundra Dubin  74 y.o. with history of sarcoidosis and emphysema was referred by Dr Lake Bells for evaluation of pulmonary hypertension.  She has a diagnosis of sarcoidosis, but CT chest in 3/19 did not show marked parenchymal disease.  PFTs showed very low DLCO.  She had RHC done in 4/19 with moderate pulmonary hypertension and very high PVR.    She is now wearing up to 6L home oxygen by [place] at all times.  She does not smoke, says she quit > 20 years ago.    Echo in 6/19 showed EF 55-60%, mildly dilated RV with moderately decreased systolic function, PASP 83 mmHg, aortic sclerosis without significant stenosis.   She did not tolerate Opsumit.   Echo in 3/20 showed EF 55-60% with mild LVH, mild RV dilation with normal systolic function, PASP 31 mmHg.  Echo was done again in 9/20, showing EF 60-65%.  I reviewed the study, and think that the RV was mildly dilated with normal systolic function. PASP 51 mmHg. IVC normal.   RHC in 10/20 showed mild pulmonary HTN with mean PA pressure 28 mmHg and PVR 2.75 WU. She is now seeing a new pulmonologist at [facility].  FDG PET study was done, this showed no active sarcoidosis and MTX was stopped.  Trelegy was started.   She returns for f/u of pulmonary HTN.  Wears 7-8 L O2 at all times.  She is on Adcirca and selexipag now, really has not felt better on PH meds.  No chest pain.  No lightheadedness or syncope.  Her exertional dyspnea has slowly worsened.  She is short of breath walking around the house.  Weight is stable.     6 minute walk (5/19): 122 m 6 minute walk (9/19):  64 m (only walked for about 1 minute).  6 minute walk (10/20): 109 m  ECG (personally reviewed): NSR, nonspecific T wave flattening  Labs (3/19): LDL 53 Labs (4/19): hgb 8.5, K 4.8, creatinine 1.06 Labs (5/19): anti-SCL70 negative, ANA negative, RF negative, ANCA negative, HIV negative, anti-centromere negative.  Labs (6/19): K 4, creatinine  1.03 Labs (9/19): K 4.6, creatinine 1.18 Labs (10/19): TSH normal, K 4.1, creatinine 1. Labs (2/20): LDL 103, K 4.2, creatinine 0.99 Labs (9/20): K 3.9, creatinine 1.06, BNP 137 Labs (10/20): K 4.6, creatinine 1.05   PMH: 1. Sarcoidosis: On home oxygen.  - PFTs (3/19): FEV1 84%, FVC 84%, ratio 71%, TLC 75%, DLCO 25% - CT (3/19) with mild fibrotic changes in the lung bases.  - FDG PET (10/20): This was not suggestive of active sarcoidosis.  2. Emphysema: Moderate emphysema on CT chest 3/19.  3. CAD: Calcification of coronaries on CT chest in 3/19.  - Cardiolite in 1/19: EF 65%, no ischemia/infarction.  4. HTN 5. Hyperlipidemia 6. Hypothyroidism 7. Anemia 8. Pulmonary hypertension:  - Echo (9/17): EF 60-65%, PASP 44 mmHg.  - RHC (4/19): mean RA 2, PA 66/16 mean 34, mean PCWP 3, PVR 8.3 WU, CI 2.04 - Autoimmune serologies negative.  - V/Q scan did not show evidence for chronic PE.  - Echo (6/19): EF 55-60%, mildly dilated RV with moderately decreased systolic function, PASP 83 mmHg, aortic sclerosis without significant stenosis.  - Echo (3/20): EF 55-60%, mild LVH, mild RV dilation with normal systolic function, PASP 31 mmHg.  - Echo (9/20): EF 60-65%, mild RV dilation with normal systolic function, PASP 51 mmHg, normal IVC.  - RHC (10/20): mean RA 4, PA 50/14 mean 28, mean  PCWP 12, CI 3.15, PVR 2.75 WU  Review of systems complete and found to be negative unless listed in HPI.   Social History   Socioeconomic History  . Marital status: Widowed    Spouse name: Not on file  . Number of children: 2  . Years of education: Not on file  . Highest education level: Not on file  Occupational History  . Not on file  Tobacco Use  . Smoking status: Former Smoker    Packs/day: 0.50    Years: 32.00    Pack years: 16.00    Types: Cigarettes    Quit date: 07/22/1994    Years since quitting: 25.1  . Smokeless tobacco: Never Used  Substance and Sexual Activity  . Alcohol use: No     Alcohol/week: 0.0 standard drinks  . Drug use: No  . Sexual activity: Not Currently  Other Topics Concern  . Not on file  Social History Narrative   Married 29 years and has 2 children (2 daughters)   Alcohol Use - no   Former Smoker - she quit 10 years ago, started when she was 26 and has had varying level of use of tobacco products from 1/2 pack to 3 packs per day.   Social Determinants of Health   Financial Resource Strain:   . Difficulty of Paying Living Expenses: Not on file  Food Insecurity:   . Worried About Charity fundraiser in the Last Year: Not on file  . Ran Out of Food in the Last Year: Not on file  Transportation Needs:   . Lack of Transportation (Medical): Not on file  . Lack of Transportation (Non-Medical): Not on file  Physical Activity:   . Days of Exercise per Week: Not on file  . Minutes of Exercise per Session: Not on file  Stress:   . Feeling of Stress : Not on file  Social Connections:   . Frequency of Communication with Friends and Family: Not on file  . Frequency of Social Gatherings with Friends and Family: Not on file  . Attends Religious Services: Not on file  . Active Member of Clubs or Organizations: Not on file  . Attends Archivist Meetings: Not on file  . Marital Status: Not on file  Intimate Partner Violence:   . Fear of Current or Ex-Partner: Not on file  . Emotionally Abused: Not on file  . Physically Abused: Not on file  . Sexually Abused: Not on file   Family History  Problem Relation Age of Onset  . Cancer Mother 31       Breast  . Heart disease Mother   . Hypertension Mother   . Hyperlipidemia Mother   . Heart attack Mother   . Asthma Maternal Grandmother     Current Outpatient Medications  Medication Sig Dispense Refill  . acetaminophen (TYLENOL) 500 MG tablet Take 1,000 mg by mouth every 6 (six) hours as needed for moderate pain.    Marland Kitchen albuterol (PROVENTIL) (2.5 MG/3ML) 0.083% nebulizer solution Take 3 mLs (2.5 mg  total) by nebulization every 6 (six) hours as needed for wheezing or shortness of breath. 30 mL 4  . aspirin EC 81 MG tablet Take 81 mg by mouth at bedtime.     Marland Kitchen atenolol (TENORMIN) 25 MG tablet Take 25 mg by mouth every evening. Take 50 mg in the morning and 25 mg in the evening    . atenolol (TENORMIN) 50 MG tablet Take 50 mg by mouth  every morning.     Marland Kitchen BESIVANCE 0.6 % SUSP Place 1 drop into the left eye See admin instructions. Only use : 1 DROP INTO LEFT 4 TIMES DAILY x 2 DAYS AFTER EYE INJECTION  12  . calcium carbonate (OSCAL) 1500 (600 Ca) MG TABS tablet Take 1,500 mg by mouth daily with breakfast.     . Cholecalciferol (VITAMIN D) 2000 UNITS tablet Take 2,000 Units by mouth every evening.     . Cinnamon 500 MG capsule Take 2,000 mg by mouth daily.      . citalopram (CELEXA) 10 MG tablet TAKE 1 TABLET BY MOUTH EVERY DAY 90 tablet 1  . Coenzyme Q10 200 MG capsule Take 200 mg by mouth at bedtime.     . fexofenadine (ALLEGRA) 180 MG tablet Take 180 mg by mouth daily.    Marland Kitchen gabapentin (NEURONTIN) 300 MG capsule TAKE 1 CAPSULE BY MOUTH EVERYDAY AT BEDTIME 90 capsule 2  . Glucosamine HCl (GLUCOSAMINE PO) Take 1 tablet by mouth every evening.     . hydrochlorothiazide (MICROZIDE) 12.5 MG capsule TAKE 1 CAPSULE BY MOUTH EVERY DAY 90 capsule 3  . ketoconazole (NIZORAL) 2 % cream Apply 1 application topically daily as needed for irritation (face).     Marland Kitchen levothyroxine (SYNTHROID) 112 MCG tablet TAKE 1 TABLET BY MOUTH EVERY DAY BEFORE BREAKFAST 90 tablet 1  . Omega-3 Fatty Acids (FISH OIL) 1200 MG CAPS Take 1,200 mg by mouth daily.     Marland Kitchen omeprazole (PRILOSEC) 40 MG capsule TAKE 1 CAPSULE BY MOUTH EVERY DAY 90 capsule 1  . predniSONE (DELTASONE) 5 MG tablet Take 5 mg by mouth daily with breakfast.    . rosuvastatin (CRESTOR) 20 MG tablet TAKE 1 TABLET BY MOUTH EVERY DAY 90 tablet 2  . Selexipag 1200 MCG TABS Take 2,400 mcg by mouth 2 (two) times daily. 60 tablet 11  . Spacer/Aero-Holding Chambers  (AEROCHAMBER PLUS WITH MASK) inhaler Use as instructed 1 each 2  . tadalafil, PAH, (ADCIRCA) 20 MG tablet Take 40 mg by mouth daily.    . traMADol (ULTRAM) 50 MG tablet Take 2 tablets (100 mg total) by mouth every 6 (six) hours as needed for moderate pain or severe pain. 120 tablet 0  . TRELEGY ELLIPTA 100-62.5-25 MCG/INH AEPB Inhale 1 puff into the lungs daily.    . vitamin B-12 (CYANOCOBALAMIN) 1000 MCG tablet Take 1,000 mcg by mouth daily.     No current facility-administered medications for this encounter.   BP 120/64   Pulse (!) 53   Wt 80.6 kg (177 lb 9.6 oz)   SpO2 97% Comment: 6L of oxygen  BMI 30.97 kg/m   Wt Readings from Last 3 Encounters:  09/13/19 80.6 kg (177 lb 9.6 oz)  06/13/19 80.4 kg (177 lb 3.2 oz)  05/27/19 79.4 kg (175 lb)   General: NAD Neck: No JVD, no thyromegaly or thyroid nodule.  Lungs: Slight crackles at bases.  CV: Nondisplaced PMI.  Heart regular S1/S2, no S3/S4, no murmur.  No peripheral edema.  No carotid bruit.  Normal pedal pulses.  Abdomen: Soft, nontender, no hepatosplenomegaly, no distention.  Skin: Intact without lesions or rashes.  Neurologic: Alert and oriented x 3.  Psych: Normal affect. Extremities: No clubbing or cyanosis.  HEENT: Normal.   Assessment/Plan: 1. Pulmonary hypertension: Cath 4/19 with moderate PAH, very high PVR. Right and left heart filling pressures were low.  PFTs in 3/19 showed mild restriction with very low DLCO consistent with pulmonary vascular disease.  V/Q scan with no evidence for chronic PE and autoimmune serologies were negative. High resolution CT in 3/19 showed mild fibrotic changes at the lung bases and a degree of emphysema.  I do not think CT findings can explain her PH.  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  Echo 6/19 showed normal LV EF but mildly dilated/moderately dysfunctional RV with severe pulmonary hypertension. 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  As parenchymal disease did not appear  particularly bad on last CT, we have been trying her on pulmonary vasodilators. She did not tolerate Opsumit.  Now on selexipag and Adcirca but without much symptomatic improvement. However, RHC in 10/20 showed lower PA pressure with PVR 2.75 WU.  She is not volume overloaded on exam.  Dyspnea has gradually worsened.  - Continue Adcirca 40 mg daily.  - Continue selexipag 2400 mcg bid.  - She did not tolerate Opsumit.  - Given lack of improvement, think that PVOD is a consideration.  I am going to arrange for her to have repeat high resolution CT chest and followup again with pulmonary.  - Check BNP.  2. Chronic hypoxemic respiratory failure: She has pulmonary sarcoidosis and emphysema.  She is on home oxygen.  Pulmonary hypertension also likely plays a role in low oxygen saturation.  FDG PET did not appear to show active pulmonary sarcoidosis so MTX has been stopped.  - Continue home oxygen.   - As above, will repeat high resolution CT chest.  - She needs followup with Dr. Vaughan Browner, will arrange.   3. HTN: BP controlled.  - Continue HCTZ 12.5 mg daily and atenolol.   Followup in 3 months.   [name], 09/14/2019

## 2019-09-16 ENCOUNTER — Encounter (HOSPITAL_COMMUNITY): Payer: Medicare Other | Admitting: Cardiology

## 2019-09-20 ENCOUNTER — Other Ambulatory Visit: Payer: Self-pay

## 2019-09-20 ENCOUNTER — Ambulatory Visit (HOSPITAL_COMMUNITY)
Admission: RE | Admit: 2019-09-20 | Discharge: 2019-09-20 | Disposition: A | Discharge status: Home / Self Care | Payer: Medicare Other | Source: Ambulatory Visit | Attending: Cardiology | Admitting: Cardiology

## 2019-09-20 DIAGNOSIS — I350 Nonrheumatic aortic (valve) stenosis: Secondary | ICD-10-CM | POA: Diagnosis not present

## 2019-09-20 DIAGNOSIS — J849 Interstitial pulmonary disease, unspecified: Secondary | ICD-10-CM

## 2019-09-20 DIAGNOSIS — I700 Atherosclerosis of aorta: Secondary | ICD-10-CM | POA: Diagnosis not present

## 2019-09-20 DIAGNOSIS — J432 Centrilobular emphysema: Secondary | ICD-10-CM | POA: Diagnosis not present

## 2019-09-20 DIAGNOSIS — I2510 Atherosclerotic heart disease of native coronary artery without angina pectoris: Secondary | ICD-10-CM | POA: Diagnosis not present

## 2019-09-21 ENCOUNTER — Encounter (INDEPENDENT_AMBULATORY_CARE_PROVIDER_SITE_OTHER): Payer: Medicare Other | Admitting: Ophthalmology

## 2019-09-21 DIAGNOSIS — I10 Essential (primary) hypertension: Secondary | ICD-10-CM

## 2019-09-21 DIAGNOSIS — H35033 Hypertensive retinopathy, bilateral: Secondary | ICD-10-CM | POA: Diagnosis not present

## 2019-09-21 DIAGNOSIS — H43813 Vitreous degeneration, bilateral: Secondary | ICD-10-CM

## 2019-09-21 DIAGNOSIS — H34832 Tributary (branch) retinal vein occlusion, left eye, with macular edema: Secondary | ICD-10-CM | POA: Diagnosis not present

## 2019-09-22 ENCOUNTER — Telehealth (HOSPITAL_COMMUNITY): Payer: Self-pay

## 2019-09-22 DIAGNOSIS — J849 Interstitial pulmonary disease, unspecified: Secondary | ICD-10-CM

## 2019-09-22 NOTE — Telephone Encounter (Signed)
Pt aware of results.  Referral for pulm placed as patient does not see a pulmonologist .

## 2019-09-22 NOTE — Telephone Encounter (Signed)
-----  Message from Larey Dresser, MD sent at 09/21/2019  8:11 AM EST ----- Emphysema, does not look like active sarcoid.  Needs pulmonary followup.

## 2019-09-30 ENCOUNTER — Other Ambulatory Visit: Payer: Self-pay | Admitting: Critical Care Medicine

## 2019-09-30 ENCOUNTER — Other Ambulatory Visit: Payer: Self-pay | Admitting: Family Medicine

## 2019-10-06 ENCOUNTER — Other Ambulatory Visit (HOSPITAL_COMMUNITY): Payer: Self-pay | Admitting: Cardiology

## 2019-10-13 ENCOUNTER — Encounter: Payer: Self-pay | Admitting: Pulmonary Disease

## 2019-10-13 ENCOUNTER — Other Ambulatory Visit: Payer: Self-pay

## 2019-10-13 ENCOUNTER — Telehealth: Payer: Self-pay | Admitting: Pulmonary Disease

## 2019-10-13 ENCOUNTER — Ambulatory Visit (INDEPENDENT_AMBULATORY_CARE_PROVIDER_SITE_OTHER): Payer: Medicare Other | Admitting: Pulmonary Disease

## 2019-10-13 VITALS — BP 120/72 | HR 60 | Temp 97.5°F | Ht 63.5 in | Wt 174.4 lb

## 2019-10-13 DIAGNOSIS — J449 Chronic obstructive pulmonary disease, unspecified: Secondary | ICD-10-CM

## 2019-10-13 DIAGNOSIS — R0602 Shortness of breath: Secondary | ICD-10-CM | POA: Diagnosis not present

## 2019-10-13 DIAGNOSIS — J439 Emphysema, unspecified: Secondary | ICD-10-CM

## 2019-10-13 DIAGNOSIS — D869 Sarcoidosis, unspecified: Secondary | ICD-10-CM

## 2019-10-13 LAB — CBC WITH DIFFERENTIAL/PLATELET
Basophils Absolute: 0 10*3/uL (ref 0.0–0.1)
Basophils Relative: 0.4 % (ref 0.0–3.0)
Eosinophils Absolute: 0.2 10*3/uL (ref 0.0–0.7)
Eosinophils Relative: 1.7 % (ref 0.0–5.0)
HCT: 39.5 % (ref 36.0–46.0)
Hemoglobin: 13.5 g/dL (ref 12.0–15.0)
Lymphocytes Relative: 11.5 % — ABNORMAL LOW (ref 12.0–46.0)
Lymphs Abs: 1.2 10*3/uL (ref 0.7–4.0)
MCHC: 34.1 g/dL (ref 30.0–36.0)
MCV: 87.6 fl (ref 78.0–100.0)
Monocytes Absolute: 0.6 10*3/uL (ref 0.1–1.0)
Monocytes Relative: 5.8 % (ref 3.0–12.0)
Neutro Abs: 8.3 10*3/uL — ABNORMAL HIGH (ref 1.4–7.7)
Neutrophils Relative %: 80.6 % — ABNORMAL HIGH (ref 43.0–77.0)
Platelets: 189 10*3/uL (ref 150.0–400.0)
RBC: 4.51 Mil/uL (ref 3.87–5.11)
RDW: 14.6 % (ref 11.5–15.5)
WBC: 10.3 10*3/uL (ref 4.0–10.5)

## 2019-10-13 LAB — COMPREHENSIVE METABOLIC PANEL
ALT: 11 U/L (ref 0–35)
AST: 16 U/L (ref 0–37)
Albumin: 4 g/dL (ref 3.5–5.2)
Alkaline Phosphatase: 41 U/L (ref 39–117)
BUN: 13 mg/dL (ref 6–23)
CO2: 28 mEq/L (ref 19–32)
Calcium: 9.7 mg/dL (ref 8.4–10.5)
Chloride: 101 mEq/L (ref 96–112)
Creatinine, Ser: 1.01 mg/dL (ref 0.40–1.20)
GFR: 53.36 mL/min — ABNORMAL LOW (ref >60.00)
Glucose, Bld: 123 mg/dL — ABNORMAL HIGH (ref 70–99)
Potassium: 3.9 mEq/L (ref 3.5–5.1)
Sodium: 138 mEq/L (ref 135–145)
Total Bilirubin: 0.5 mg/dL (ref 0.2–1.2)
Total Protein: 6.8 g/dL (ref 6.0–8.3)

## 2019-10-13 LAB — BRAIN NATRIURETIC PEPTIDE: Pro B Natriuretic peptide (BNP): 151 pg/mL — ABNORMAL HIGH (ref 0.0–100.0)

## 2019-10-13 MED ORDER — IPRATROPIUM-ALBUTEROL 0.5-2.5 (3) MG/3ML IN SOLN: 3.0000 mL | RESPIRATORY_TRACT | 11 refills | Status: DC | PRN

## 2019-10-13 NOTE — Progress Notes (Signed)
Kimberly Irwin    [id number]    Jan 08, 1946  Primary Care Physician:Blyth, Bonnita Levan, MD  Referring Physician: Larey Dresser, MD 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 Lake Bells.  She was diagnosed with sarcoidosis diagnosed by mediastinoscopy in 2003.  Initially not treated as it is felt that she did not have active sarcoid.  Diagnosed with pulmonary hypertension in 2019.  Treated with tadalafil and selexipag.  Unable to tolerate macitentan due to swelling, headache, nausea.  She never really felt any better on Rx for PHTN.  Repeat right heart cath November 2020 shows relatively well-controlled pulmonary hypertension.  Started on prednisone and methotrexate for treatment of sarcoidosis in December 2019 since she continued to have significant symptoms with dyspnea, hypoxemia though no clear evidence of sarcoid involvement of the lungs on imaging.  Also started on Bactrim 3 times a week for PCP prophylaxis.  She said a second opinion at [facility] pulmonary on 05/17/19 with PET scan showing no uptake suggesting no sarcoid activity.  She was taken off methotrexate and Bactrim.  Symbicort was changed to Trelegy.  Continues to have significant dyspnea on exertion, O2 requirements are stable at 4-6L.  She is frustrated about the continued need for oxygen and dyspnea.  Pets: Has a dog Occupation: Used to work in Development worker, community Exposures: No known exposures.  No mold, hot tub, Jacuzzi Smoking history: 16-pack-year smoker.  Quit in 1990 Travel history: No significant travel Relevant family history: No significant family history of lung disease  Outpatient Encounter Medications as of 10/13/2019  Medication Sig  . acetaminophen (TYLENOL) 500 MG tablet Take 1,000 mg by mouth  every 6 (six) hours as needed for moderate pain.  Marland Kitchen albuterol (PROVENTIL) (2.5 MG/3ML) 0.083% nebulizer solution Take 3 mLs (2.5 mg total) by nebulization every 6 (six) hours as needed for wheezing or shortness of breath.  Marland Kitchen aspirin EC 81 MG tablet Take 81 mg by mouth at bedtime.   Marland Kitchen atenolol (TENORMIN) 25 MG tablet TAKE 1 TABLET (25 MG TOTAL) BY MOUTH AT BEDTIME. TAKE THE 50 MG ATENOLOL IN AM AND THE 25 MG IN PM  . atenolol (TENORMIN) 50 MG tablet TAKE 1 TABLET BY MOUTH EVERY DAY  . BESIVANCE 0.6 % SUSP Place 1 drop into the left eye See admin instructions. Only use : 1 DROP INTO LEFT 4 TIMES DAILY x 2 DAYS AFTER EYE INJECTION  . calcium carbonate (OSCAL) 1500 (600 Ca) MG TABS tablet Take 1,500 mg by mouth daily with breakfast.   . Cholecalciferol (VITAMIN D) 2000 UNITS tablet Take 2,000 Units by mouth every evening.   . Cinnamon 500 MG capsule Take 2,000 mg by mouth daily.    . citalopram (CELEXA) 10 MG tablet TAKE 1 TABLET BY MOUTH EVERY DAY  . Coenzyme Q10 200 MG capsule Take 200 mg by mouth at bedtime.   . fexofenadine (ALLEGRA) 180 MG tablet Take 180 mg by mouth daily.  Marland Kitchen gabapentin (NEURONTIN) 300 MG capsule TAKE 1 CAPSULE BY MOUTH EVERYDAY AT BEDTIME  . Glucosamine HCl (GLUCOSAMINE PO) Take 1 tablet by mouth every evening.   . hydrochlorothiazide (MICROZIDE) 12.5 MG capsule TAKE 1 CAPSULE BY MOUTH EVERY DAY  . ketoconazole (NIZORAL) 2 % cream Apply 1 application topically daily as needed for irritation (face).   Marland Kitchen  levothyroxine (SYNTHROID) 112 MCG tablet TAKE 1 TABLET BY MOUTH EVERY DAY BEFORE BREAKFAST  . Omega-3 Fatty Acids (FISH OIL) 1200 MG CAPS Take 1,200 mg by mouth daily.   Marland Kitchen omeprazole (PRILOSEC) 40 MG capsule TAKE 1 CAPSULE BY MOUTH EVERY DAY  . predniSONE (DELTASONE) 5 MG tablet TAKE 2 TABLETS BY MOUTH DAILY WITH BREAKFAST. (Patient taking differently: Take 5 mg by mouth daily with breakfast. )  . rosuvastatin (CRESTOR) 20 MG tablet TAKE 1 TABLET BY MOUTH EVERY DAY  . Selexipag  1200 MCG TABS Take 2,400 mcg by mouth 2 (two) times daily.  Marland Kitchen Spacer/Aero-Holding Chambers (AEROCHAMBER PLUS WITH MASK) inhaler Use as instructed  . tadalafil, PAH, (ADCIRCA) 20 MG tablet Take 2 tablets (40 mg total) by mouth daily.  . traMADol (ULTRAM) 50 MG tablet Take 2 tablets (100 mg total) by mouth every 6 (six) hours as needed for moderate pain or severe pain.  . TRELEGY ELLIPTA 100-62.5-25 MCG/INH AEPB Inhale 1 puff into the lungs daily.  . vitamin B-12 (CYANOCOBALAMIN) 1000 MCG tablet Take 1,000 mcg by mouth daily.   No facility-administered encounter medications on file as of 10/13/2019.   Physical Exam: Blood pressure 120/72, pulse 60, temperature (!) 97.5 F (36.4 C), temperature source Temporal, height 5' 3.5" (1.613 m), weight 174 lb 6.4 oz (79.1 kg), SpO2 90 %. Gen:      No acute distress HEENT:  EOMI, sclera anicteric Neck:     No masses; no thyromegaly Lungs:    Clear to auscultation bilaterally; normal respiratory effort CV:         Regular rate and rhythm; no murmurs Abd:      + bowel sounds; soft, non-tender; no palpable masses, no distension Ext:    No edema; adequate peripheral perfusion Skin:      Warm and dry; no rash Neuro: alert and oriented x 3 Psych: normal mood and affect  Data Reviewed: Imaging: HRCT 10/20/17-  atherosclerosis, mild air trapping, septal thickening RLL, bronchial wall thickening, moderate centrilobular and mild paraseptal emphysema, 6 mm RUL nodule, 3 mm RUL nodule  V/Q scan 12/09/17-  normal perfusion scan  PET scan 06/03/2019 [[facility])-no abnormal FDG uptake, diffuse bronchial wall thickening, trace effusions with mild septal thickening.  HRCT 09/20/2019-unchanged mild basal scarring.  Mild air trapping, moderate emphysema.  Stable pulmonary nodules I have reviewed the images personally.  PFTs: 10/19/2017 FVC 2.43 (84%), FEV1 1.83 [84%], F/F 75, TLC 3.83 [75%], DLCO 6.15 [25%] Minimal restriction with severe diffusion defect.  6-minute  walk test 05/17/2019-ambulated 232 m, required 6 L of supplemental oxygen  Labs: Mediastinoscopy 06/20/02-  Non necrotizing granulomas A1AT 10/01/16-  118, MM Serology 12/04/17-  ANA, SCL 70, anti centromere, RF all negative  Cardiac:  Echo (9/17): EF 60-65%, PASP 44 mmHg.  - RHC (4/19): mean RA 2, PA 66/16 mean 34, mean PCWP 3, PVR 8.3 WU, CI 2.04 - Autoimmune serologies negative.  - V/Q scan did not show evidence for chronic PE.  - Echo (6/19): EF 55-60%, mildly dilated RV with moderately decreased systolic function, PASP 83 mmHg, aortic sclerosis without significant stenosis.  - Echo (3/20): EF 55-60%, mild LVH, mild RV dilation with normal systolic function, PASP 31 mmHg.  - Echo (9/20): EF 60-65%, mild RV dilation with normal systolic function, PASP 51 mmHg, normal IVC.  - RHC (10/20): mean RA 4, PA 50/14 mean 28, mean PCWP 12, CI 3.15, PVR 2.75 WU  Assessment:  Sarcoidosis No evidence of active disease on CT scan of  the chest or recent PET scan at [facility] She is off methotrexate and Bactrim We will start tapering off the steroids as well.  Emphysema No obstruction on PFTs but she does have moderate emphysema on CT scan Continue Trelegy inhaler Start duo nebs  Pulmonary hypertension Continues on tadalafil and selexipag.  Heart catheterization last year shows mild to moderate pulmonary hypertension which is an improvement over prior cath values  There is a question of PVOD but I do not see any CT scan findings that are suggestive such as groundglass opacification, mediastinal lymphadenopathy or septal thickening.  We will review her scan at the multidisciplinary ILD conference to get input from our thoracic radiologist.  Overall she is pretty symptomatic which is likely multifactorial from the above conditions We will recheck some labs today including D-dimer, BNP to make sure not missing anything She may benefit from a cardiopulmonary exercise test but she feels that she will not be  able to exercise on a bike. Recommended pulmonary rehab but she will not be able to make the trip to [facility] We will try home PT to work with her.  Plan/Recommendations: - Continue Trelegy, start duo nebs - Home PT - Start tapering prednisone to 2.5 mg a day until return visit next month - Recheck labs including CBC, IgE, metabolic panel, BNP, D-dimer  Marshell Garfinkel MD [facility] 10/13/2019, 8:33 AM  CC: Larey Dresser, MD

## 2019-10-13 NOTE — Telephone Encounter (Signed)
Called and spoke with Patient.  Duo neb is not covered through insurance at [place]. Patient uses D[facility] for O2 and neb machine.  Duo neb prescription sent to [facility], with diagnosis codes added to script.  Nothing further at this time.

## 2019-10-13 NOTE — Patient Instructions (Addendum)
Continue the Trelegy inhaler We will start you on duo nebs 3-4 times a day as needed We will order home PT and home pulmonary rehab Come down on prednisone to 2.5 mg a day We will check labs today including CBC with differential, IgE, metabolic panel, BNP and D-dimer  Follow-up in 1 month.

## 2019-10-13 NOTE — Telephone Encounter (Signed)
Returned call to [place], [facility] Pulmonary rehab.  Carlette stated they do not do home pulmonary rehab.  There is a non supervised, virtual pulmonary rehab for Patients. Carlette was unsure if Patient is needing home health PT, until Patient is strong enough to come to [facility] for pulmonary rehab.  Message routed to Dr.Mannam, to advise  10/13/19 OV-  Instructions  Continue the Trelegy inhaler We will start you on duo nebs 3-4 times a day as needed We will order home PT and home pulmonary rehab Come down on prednisone to 2.5 mg a day We will check labs today including CBC with differential, IgE, metabolic panel, BNP and D-dimer  Follow-up in 1 month.

## 2019-10-14 ENCOUNTER — Telehealth: Payer: Self-pay

## 2019-10-14 DIAGNOSIS — R071 Chest pain on breathing: Secondary | ICD-10-CM

## 2019-10-14 DIAGNOSIS — R7989 Other specified abnormal findings of blood chemistry: Secondary | ICD-10-CM

## 2019-10-14 LAB — IGE: IgE (Immunoglobulin E), Serum: 46 kU/L (ref <=114)

## 2019-10-14 LAB — D-DIMER, QUANTITATIVE: D-Dimer, Quant: 0.82 mcg/mL FEU — ABNORMAL HIGH (ref <0.50)

## 2019-10-14 NOTE — Telephone Encounter (Signed)
-----  Message from Marshell Garfinkel, MD sent at 10/14/2019  1:24 PM EST ----- D Dimer is slightly elevated. I called and discussed with patient.  Please order CTA for further eval

## 2019-10-16 DIAGNOSIS — Z7982 Long term (current) use of aspirin: Secondary | ICD-10-CM | POA: Diagnosis not present

## 2019-10-16 DIAGNOSIS — J849 Interstitial pulmonary disease, unspecified: Secondary | ICD-10-CM | POA: Diagnosis not present

## 2019-10-16 DIAGNOSIS — Z87891 Personal history of nicotine dependence: Secondary | ICD-10-CM | POA: Diagnosis not present

## 2019-10-16 DIAGNOSIS — J439 Emphysema, unspecified: Secondary | ICD-10-CM | POA: Diagnosis not present

## 2019-10-16 DIAGNOSIS — Z7952 Long term (current) use of systemic steroids: Secondary | ICD-10-CM | POA: Diagnosis not present

## 2019-10-16 DIAGNOSIS — I272 Pulmonary hypertension, unspecified: Secondary | ICD-10-CM | POA: Diagnosis not present

## 2019-10-16 DIAGNOSIS — D869 Sarcoidosis, unspecified: Secondary | ICD-10-CM | POA: Diagnosis not present

## 2019-10-17 ENCOUNTER — Encounter (HOSPITAL_COMMUNITY): Payer: Self-pay

## 2019-10-17 ENCOUNTER — Telehealth: Payer: Self-pay | Admitting: Pulmonary Disease

## 2019-10-17 ENCOUNTER — Ambulatory Visit (HOSPITAL_COMMUNITY)
Admission: RE | Admit: 2019-10-17 | Discharge: 2019-10-17 | Disposition: A | Discharge status: Home / Self Care | Payer: Medicare Other | Source: Ambulatory Visit | Attending: Pulmonary Disease | Admitting: Pulmonary Disease

## 2019-10-17 ENCOUNTER — Other Ambulatory Visit: Payer: Self-pay

## 2019-10-17 DIAGNOSIS — R071 Chest pain on breathing: Secondary | ICD-10-CM

## 2019-10-17 DIAGNOSIS — R7989 Other specified abnormal findings of blood chemistry: Secondary | ICD-10-CM | POA: Diagnosis not present

## 2019-10-17 DIAGNOSIS — R0602 Shortness of breath: Secondary | ICD-10-CM | POA: Diagnosis not present

## 2019-10-17 MED ORDER — IOHEXOL 350 MG/ML SOLN
75.0000 mL | Freq: Once | INTRAVENOUS | Status: AC | PRN
  Administered 2019-10-17: 75 mL via INTRAVENOUS

## 2019-10-17 NOTE — Telephone Encounter (Signed)
We ordered PT for her so called and gave VO okaying the PT  Nothing further needed

## 2019-10-18 NOTE — Telephone Encounter (Signed)
lmtcb for Kimberly Irwin at pulm rehab to make aware of Dr. Matilde Bash recs.

## 2019-10-18 NOTE — Telephone Encounter (Signed)
Yes. Home health PT is fine

## 2019-10-19 NOTE — Telephone Encounter (Signed)
Spoke with Kimberly Irwin. She verbalized understanding and will close out the referral. Nothing further needed at time of call.

## 2019-10-19 NOTE — Telephone Encounter (Signed)
Carlette is returning phone call.  Carlette phone number is [phone].

## 2019-10-22 DIAGNOSIS — J849 Interstitial pulmonary disease, unspecified: Secondary | ICD-10-CM | POA: Diagnosis not present

## 2019-10-22 DIAGNOSIS — D869 Sarcoidosis, unspecified: Secondary | ICD-10-CM | POA: Diagnosis not present

## 2019-10-22 DIAGNOSIS — J439 Emphysema, unspecified: Secondary | ICD-10-CM | POA: Diagnosis not present

## 2019-10-22 DIAGNOSIS — Z7982 Long term (current) use of aspirin: Secondary | ICD-10-CM | POA: Diagnosis not present

## 2019-10-22 DIAGNOSIS — Z7952 Long term (current) use of systemic steroids: Secondary | ICD-10-CM | POA: Diagnosis not present

## 2019-10-22 DIAGNOSIS — I272 Pulmonary hypertension, unspecified: Secondary | ICD-10-CM | POA: Diagnosis not present

## 2019-10-25 DIAGNOSIS — J439 Emphysema, unspecified: Secondary | ICD-10-CM | POA: Diagnosis not present

## 2019-10-25 DIAGNOSIS — J849 Interstitial pulmonary disease, unspecified: Secondary | ICD-10-CM | POA: Diagnosis not present

## 2019-10-25 DIAGNOSIS — Z7952 Long term (current) use of systemic steroids: Secondary | ICD-10-CM | POA: Diagnosis not present

## 2019-10-25 DIAGNOSIS — I272 Pulmonary hypertension, unspecified: Secondary | ICD-10-CM | POA: Diagnosis not present

## 2019-10-25 DIAGNOSIS — Z7982 Long term (current) use of aspirin: Secondary | ICD-10-CM | POA: Diagnosis not present

## 2019-10-25 DIAGNOSIS — D869 Sarcoidosis, unspecified: Secondary | ICD-10-CM | POA: Diagnosis not present

## 2019-11-01 DIAGNOSIS — D869 Sarcoidosis, unspecified: Secondary | ICD-10-CM | POA: Diagnosis not present

## 2019-11-01 DIAGNOSIS — Z7952 Long term (current) use of systemic steroids: Secondary | ICD-10-CM | POA: Diagnosis not present

## 2019-11-01 DIAGNOSIS — J439 Emphysema, unspecified: Secondary | ICD-10-CM | POA: Diagnosis not present

## 2019-11-01 DIAGNOSIS — J849 Interstitial pulmonary disease, unspecified: Secondary | ICD-10-CM | POA: Diagnosis not present

## 2019-11-01 DIAGNOSIS — I272 Pulmonary hypertension, unspecified: Secondary | ICD-10-CM | POA: Diagnosis not present

## 2019-11-01 DIAGNOSIS — Z7982 Long term (current) use of aspirin: Secondary | ICD-10-CM | POA: Diagnosis not present

## 2019-11-08 DIAGNOSIS — I272 Pulmonary hypertension, unspecified: Secondary | ICD-10-CM | POA: Diagnosis not present

## 2019-11-08 DIAGNOSIS — J849 Interstitial pulmonary disease, unspecified: Secondary | ICD-10-CM | POA: Diagnosis not present

## 2019-11-08 DIAGNOSIS — Z7982 Long term (current) use of aspirin: Secondary | ICD-10-CM | POA: Diagnosis not present

## 2019-11-08 DIAGNOSIS — Z7952 Long term (current) use of systemic steroids: Secondary | ICD-10-CM | POA: Diagnosis not present

## 2019-11-08 DIAGNOSIS — J439 Emphysema, unspecified: Secondary | ICD-10-CM | POA: Diagnosis not present

## 2019-11-08 DIAGNOSIS — D869 Sarcoidosis, unspecified: Secondary | ICD-10-CM | POA: Diagnosis not present

## 2019-11-09 ENCOUNTER — Encounter (INDEPENDENT_AMBULATORY_CARE_PROVIDER_SITE_OTHER): Payer: Medicare Other | Admitting: Ophthalmology

## 2019-11-09 ENCOUNTER — Other Ambulatory Visit: Payer: Self-pay

## 2019-11-09 DIAGNOSIS — H34832 Tributary (branch) retinal vein occlusion, left eye, with macular edema: Secondary | ICD-10-CM

## 2019-11-09 DIAGNOSIS — H35033 Hypertensive retinopathy, bilateral: Secondary | ICD-10-CM

## 2019-11-09 DIAGNOSIS — H43813 Vitreous degeneration, bilateral: Secondary | ICD-10-CM | POA: Diagnosis not present

## 2019-11-09 DIAGNOSIS — I10 Essential (primary) hypertension: Secondary | ICD-10-CM | POA: Diagnosis not present

## 2019-11-12 ENCOUNTER — Other Ambulatory Visit: Payer: Self-pay | Admitting: Critical Care Medicine

## 2019-11-14 DIAGNOSIS — Z7982 Long term (current) use of aspirin: Secondary | ICD-10-CM | POA: Diagnosis not present

## 2019-11-14 DIAGNOSIS — J439 Emphysema, unspecified: Secondary | ICD-10-CM | POA: Diagnosis not present

## 2019-11-14 DIAGNOSIS — Z7952 Long term (current) use of systemic steroids: Secondary | ICD-10-CM | POA: Diagnosis not present

## 2019-11-14 DIAGNOSIS — I272 Pulmonary hypertension, unspecified: Secondary | ICD-10-CM | POA: Diagnosis not present

## 2019-11-14 DIAGNOSIS — D869 Sarcoidosis, unspecified: Secondary | ICD-10-CM | POA: Diagnosis not present

## 2019-11-14 DIAGNOSIS — J849 Interstitial pulmonary disease, unspecified: Secondary | ICD-10-CM | POA: Diagnosis not present

## 2019-11-15 ENCOUNTER — Ambulatory Visit: Payer: Self-pay | Admitting: Pulmonary Disease

## 2019-11-15 DIAGNOSIS — I272 Pulmonary hypertension, unspecified: Secondary | ICD-10-CM | POA: Diagnosis not present

## 2019-11-15 DIAGNOSIS — J439 Emphysema, unspecified: Secondary | ICD-10-CM | POA: Diagnosis not present

## 2019-11-15 DIAGNOSIS — Z7982 Long term (current) use of aspirin: Secondary | ICD-10-CM | POA: Diagnosis not present

## 2019-11-15 DIAGNOSIS — J849 Interstitial pulmonary disease, unspecified: Secondary | ICD-10-CM | POA: Diagnosis not present

## 2019-11-15 DIAGNOSIS — D869 Sarcoidosis, unspecified: Secondary | ICD-10-CM | POA: Diagnosis not present

## 2019-11-15 DIAGNOSIS — Z87891 Personal history of nicotine dependence: Secondary | ICD-10-CM | POA: Diagnosis not present

## 2019-11-15 DIAGNOSIS — Z7952 Long term (current) use of systemic steroids: Secondary | ICD-10-CM | POA: Diagnosis not present

## 2019-11-17 ENCOUNTER — Telehealth (HOSPITAL_COMMUNITY): Payer: Self-pay

## 2019-11-17 NOTE — Telephone Encounter (Signed)
Patient Assistance form for uptravi signed by MD and faxed back to [facility]. Confirmation received.

## 2019-11-17 NOTE — Progress Notes (Signed)
   Interstitial Lung Disease Multidisciplinary Conference   Kimberly Irwin    MRN [id number]    DOB [birth year]  Primary Care Physician:Blyth, Bonnita Levan, MD  Referring Physician: Marshell Garfinkel MD  Time of Conference: 7.30am- 8.30am Date of conference: 11/15/19 Location of Conference: -  Virtual  Participating Pulmonary: Dr. 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  Methotrexate stopped in late 2020 after PET scan was negative indicating inactive sarcoid Referred back to pulmonary clinic for increasing dyspnea and concern for PVOD  MDC discussion to review CT scan findings for PVOD  MDD discussion of CT scan   HRCT 10/17/19 Patient has enlarged pulmonary arteries and mild mosaic attenuation which is consistent with known pulmonary hypertension.  She does have mild lymph node enlargement which can be attributed to sarcoid She does not have additional findings of PVOD such as groundglass opacities, interlobular septal thickening, pleural effusions, caliber of her bronchial vessels to bronchi in the peripheries are normal.  MDD Impression/Recs:  No clear evidence of PVOD.  Findings on CT scan can be attributed to her known pulmonary arterial hypertension.   Time Spent in preparation and discussion:  > 30 min    SIGNATURE   Yu Cragun MD [facility] Please see [email or web address] for pager details.  11/17/2019, 12:56 PM

## 2019-11-18 ENCOUNTER — Other Ambulatory Visit: Payer: Self-pay | Admitting: Family Medicine

## 2019-11-21 ENCOUNTER — Telehealth (HOSPITAL_COMMUNITY): Payer: Self-pay | Admitting: Pharmacist

## 2019-11-21 NOTE — Telephone Encounter (Signed)
Advanced Heart Failure Patient Advocate Encounter   Patient was approved to receive Uptravi from [employer] [facility].  Effective dates: 11/18/19 through 08/03/20  Audry Riles, PharmD, BCPS, BCCP, CPP Heart Failure Clinic Pharmacist [phone]

## 2019-11-22 ENCOUNTER — Other Ambulatory Visit (HOSPITAL_COMMUNITY): Payer: Self-pay | Admitting: Cardiology

## 2019-11-22 ENCOUNTER — Other Ambulatory Visit: Payer: Self-pay

## 2019-11-22 ENCOUNTER — Ambulatory Visit (INDEPENDENT_AMBULATORY_CARE_PROVIDER_SITE_OTHER): Payer: Medicare Other | Admitting: Pulmonary Disease

## 2019-11-22 VITALS — BP 118/74 | HR 60 | Temp 97.9°F | Ht 64.0 in | Wt 172.4 lb

## 2019-11-22 DIAGNOSIS — D869 Sarcoidosis, unspecified: Secondary | ICD-10-CM

## 2019-11-22 DIAGNOSIS — I272 Pulmonary hypertension, unspecified: Secondary | ICD-10-CM | POA: Diagnosis not present

## 2019-11-22 DIAGNOSIS — J439 Emphysema, unspecified: Secondary | ICD-10-CM

## 2019-11-22 NOTE — Patient Instructions (Signed)
We have reviewed your CT scan at our conference We do not see any evidence of additional processes such as venoocclusive disease Your sarcoid does not appear to be active at present We will stop the prednisone as it does not seem to be helping Continue Trelegy inhaler and supplemental oxygen  Follow-up in 3 months.

## 2019-11-22 NOTE — Progress Notes (Signed)
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 Lake Bells.  She was diagnosed with sarcoidosis diagnosed by mediastinoscopy in 2003.  Initially not treated as it is felt that she did not have active sarcoid.  Diagnosed with pulmonary hypertension in 2019.  Treated with tadalafil and selexipag.  Unable to tolerate macitentan due to swelling, headache, nausea.  She never really felt any better on Rx for PHTN.  Repeat right heart cath November 2020 shows relatively well-controlled pulmonary hypertension.  Started on prednisone and methotrexate for treatment of sarcoidosis in December 2019 since she continued to have significant symptoms with dyspnea, hypoxemia though no clear evidence of sarcoid involvement of the lungs on imaging.  Also started on Bactrim 3 times a week for PCP prophylaxis.  She said a second opinion at [facility] pulmonary on 05/17/19 with PET scan showing no uptake suggesting no sarcoid activity.  She was taken off methotrexate and Bactrim.  Symbicort was changed to Trelegy.  Continues to have significant dyspnea on exertion, O2 requirements are stable at 4-6L.  She is frustrated about the continued need for oxygen and dyspnea.  Pets: Has a dog Occupation: Used to work in Development worker, community Exposures: No known exposures.  No mold, hot tub, Jacuzzi Smoking history: 16-pack-year smoker.  Quit in 1990 Travel history: No significant travel Relevant family history: No significant family history of lung disease  Interim history: Continues on supplemental oxygen.  Continues to have dyspnea on exertion without change No new complaints  today.  Labs at last visit showed elevated D-dimer.  No evidence of pulmonary embolism on CTA.  Outpatient Encounter Medications as of 11/22/2019  Medication Sig  . acetaminophen (TYLENOL) 500 MG tablet Take 1,000 mg by mouth every 6 (six) hours as needed for moderate pain.  Marland Kitchen albuterol (PROVENTIL) (2.5 MG/3ML) 0.083% nebulizer solution Take 3 mLs (2.5 mg total) by nebulization every 6 (six) hours as needed for wheezing or shortness of breath.  Marland Kitchen aspirin EC 81 MG tablet Take 81 mg by mouth at bedtime.   Marland Kitchen atenolol (TENORMIN) 25 MG tablet TAKE 1 TABLET (25 MG TOTAL) BY MOUTH AT BEDTIME. TAKE THE 50 MG ATENOLOL IN AM AND THE 25 MG IN PM  . atenolol (TENORMIN) 50 MG tablet TAKE 1 TABLET BY MOUTH EVERY DAY  . BESIVANCE 0.6 % SUSP Place 1 drop into the left eye See admin instructions. Only use : 1 DROP INTO LEFT 4 TIMES DAILY x 2 DAYS AFTER EYE INJECTION  . calcium carbonate (OSCAL) 1500 (600 Ca) MG TABS tablet Take 1,500 mg by mouth daily with breakfast.   . Cholecalciferol (VITAMIN D) 2000 UNITS tablet Take 2,000 Units by mouth every evening.   . Cinnamon 500 MG capsule Take 2,000 mg by mouth daily.    . citalopram (CELEXA) 10 MG tablet Take 1 tablet (10 mg total) by mouth daily.  . Coenzyme Q10 200 MG capsule Take 200 mg by mouth at bedtime.   . fexofenadine (ALLEGRA) 180 MG tablet Take 180 mg by mouth daily.  Marland Kitchen gabapentin (NEURONTIN) 300 MG capsule TAKE 1 CAPSULE BY MOUTH EVERYDAY AT BEDTIME  . Glucosamine HCl (GLUCOSAMINE PO)  Take 1 tablet by mouth every evening.   . hydrochlorothiazide (MICROZIDE) 12.5 MG capsule TAKE 1 CAPSULE BY MOUTH EVERY DAY  . ipratropium-albuterol (DUONEB) 0.5-2.5 (3) MG/3ML SOLN Take 3 mLs by nebulization every 4 (four) hours as needed.  Marland Kitchen ketoconazole (NIZORAL) 2 % cream Apply 1 application topically daily as needed for irritation (face).   Marland Kitchen levothyroxine (SYNTHROID) 112 MCG tablet Take 1 tablet (112 mcg total) by mouth daily before breakfast.  . Omega-3 Fatty Acids  (FISH OIL) 1200 MG CAPS Take 1,200 mg by mouth daily.   Marland Kitchen omeprazole (PRILOSEC) 40 MG capsule Take 1 capsule (40 mg total) by mouth daily.  . predniSONE (DELTASONE) 5 MG tablet TAKE 2 TABLETS BY MOUTH DAILY WITH BREAKFAST. (Patient taking differently: 2.5 mg daily with breakfast. )  . rosuvastatin (CRESTOR) 20 MG tablet TAKE 1 TABLET BY MOUTH EVERY DAY  . Spacer/Aero-Holding Chambers (AEROCHAMBER PLUS WITH MASK) inhaler Use as instructed  . tadalafil, PAH, (ADCIRCA) 20 MG tablet Take 2 tablets (40 mg total) by mouth daily.  . traMADol (ULTRAM) 50 MG tablet Take 2 tablets (100 mg total) by mouth every 6 (six) hours as needed for moderate pain or severe pain.  . TRELEGY ELLIPTA 100-62.5-25 MCG/INH AEPB Inhale 1 puff into the lungs daily.  Marland Kitchen UPTRAVI 1200 MCG TABS Take 2,400 mcg by mouth 2 (two) times daily.  . vitamin B-12 (CYANOCOBALAMIN) 1000 MCG tablet Take 1,000 mcg by mouth daily.   No facility-administered encounter medications on file as of 11/22/2019.   Physical Exam: Blood pressure 118/74, pulse 60, temperature 97.9 F (36.6 C), temperature source Temporal, height _0  (1.626 m), weight 172 lb 6.4 oz (78.2 kg), SpO2 96 %. Gen:      No acute distress HEENT:  EOMI, sclera anicteric Neck:     No masses; no thyromegaly Lungs:    Clear to auscultation bilaterally; normal respiratory effort CV:         Regular rate and rhythm; no murmurs Abd:      + bowel sounds; soft, non-tender; no palpable masses, no distension Ext:    No edema; adequate peripheral perfusion Skin:      Warm and dry; no rash Neuro: alert and oriented x 3 Psych: normal mood and affect  Data Reviewed: Imaging: HRCT 10/20/17-  atherosclerosis, mild air trapping, septal thickening RLL, bronchial wall thickening, moderate centrilobular and mild paraseptal emphysema, 6 mm RUL nodule, 3 mm RUL nodule  V/Q scan 12/09/17-  normal perfusion scan  PET scan 06/03/2019 [[facility])-no abnormal FDG uptake, diffuse bronchial wall  thickening, trace effusions with mild septal thickening.  HRCT 09/20/2019-unchanged mild basal scarring.  Mild air trapping, moderate emphysema.  Stable pulmonary nodules  CTA 10/17/2019-no PE, cardiomegaly, small airways disease with moderate emphysema, slight increase in mediastinal lymph nodes. I have reviewed the images personally.  PFTs: 10/19/2017 FVC 2.43 (84%), FEV1 1.83 [84%], F/F 75, TLC 3.83 [75%], DLCO 6.15 [25%] Minimal restriction with severe diffusion defect.  6-minute walk test 05/17/2019-ambulated 232 m, required 6 L of supplemental oxygen  Labs: Mediastinoscopy 06/20/02-  Non necrotizing granulomas A1AT 10/01/16-  118, MM Serology 12/04/17-  ANA, SCL 70, anti centromere, RF all negative  Cardiac:  Echo (9/17): EF 60-65%, PASP 44 mmHg.  - RHC (4/19): mean RA 2, PA 66/16 mean 34, mean PCWP 3, PVR 8.3 WU, CI 2.04 - Autoimmune serologies negative.  - V/Q scan did not show evidence for chronic PE.  - Echo (6/19): EF 55-60%, mildly dilated RV with moderately decreased  systolic function, PASP 83 mmHg, aortic sclerosis without significant stenosis.  - Echo (3/20): EF 55-60%, mild LVH, mild RV dilation with normal systolic function, PASP 31 mmHg.  - Echo (9/20): EF 60-65%, mild RV dilation with normal systolic function, PASP 51 mmHg, normal IVC.  - RHC (10/20): mean RA 4, PA 50/14 mean 28, mean PCWP 12, CI 3.15, PVR 2.75 WU  Assessment:  Sarcoidosis No evidence of active disease on CT scan of the chest or recent PET scan at [facility] She is off methotrexate and Bactrim We are tapering off steroids as well.  She is currently at 2.5 mg which we will stop after this visit.  Emphysema No obstruction on PFTs but she does have moderate emphysema on CT scan Continue Trelegy inhaler Start duo nebs  Pulmonary hypertension Continues on tadalafil and selexipag.  Heart catheterization last year shows mild to moderate pulmonary hypertension which is an improvement over prior cath  values  There is a question of PVOD but I do not see any CT scan findings that are suggestive such as groundglass opacification, mediastinal lymphadenopathy or septal thickening.  We have reviewed her scan at the pulmonary multidisciplinary ILD conference and the radiologist concurred that there is no clear evidence of PVOD  Overall she is pretty symptomatic which is likely multifactorial from the above conditions and frustrated with no improvement.  I am not sure we can add much to her therapies at present. She may benefit from a cardiopulmonary exercise test but she feels that she will not be able to exercise on a bike. Recommended pulmonary rehab but she will not be able to make the trip to [facility] and she does not want to try home PT  Plan/Recommendations: - Continue Trelegy, duo nebs - Stop prednisone   Marshell Garfinkel MD [facility] 11/22/2019, 3:48 PM  CC: Mosie Lukes, MD

## 2019-11-23 DIAGNOSIS — J849 Interstitial pulmonary disease, unspecified: Secondary | ICD-10-CM | POA: Diagnosis not present

## 2019-11-23 DIAGNOSIS — I272 Pulmonary hypertension, unspecified: Secondary | ICD-10-CM | POA: Diagnosis not present

## 2019-11-23 DIAGNOSIS — Z7982 Long term (current) use of aspirin: Secondary | ICD-10-CM | POA: Diagnosis not present

## 2019-11-23 DIAGNOSIS — D869 Sarcoidosis, unspecified: Secondary | ICD-10-CM | POA: Diagnosis not present

## 2019-11-23 DIAGNOSIS — Z7952 Long term (current) use of systemic steroids: Secondary | ICD-10-CM | POA: Diagnosis not present

## 2019-11-23 DIAGNOSIS — J439 Emphysema, unspecified: Secondary | ICD-10-CM | POA: Diagnosis not present

## 2019-11-29 DIAGNOSIS — D869 Sarcoidosis, unspecified: Secondary | ICD-10-CM | POA: Diagnosis not present

## 2019-11-29 DIAGNOSIS — Z7952 Long term (current) use of systemic steroids: Secondary | ICD-10-CM | POA: Diagnosis not present

## 2019-11-29 DIAGNOSIS — J849 Interstitial pulmonary disease, unspecified: Secondary | ICD-10-CM | POA: Diagnosis not present

## 2019-11-29 DIAGNOSIS — Z7982 Long term (current) use of aspirin: Secondary | ICD-10-CM | POA: Diagnosis not present

## 2019-11-29 DIAGNOSIS — I272 Pulmonary hypertension, unspecified: Secondary | ICD-10-CM | POA: Diagnosis not present

## 2019-11-29 DIAGNOSIS — J439 Emphysema, unspecified: Secondary | ICD-10-CM | POA: Diagnosis not present

## 2019-12-05 ENCOUNTER — Telehealth: Payer: Self-pay | Admitting: Pulmonary Disease

## 2019-12-05 MED ORDER — PREDNISONE 5 MG PO TABS: 5.0000 mg | ORAL_TABLET | Freq: Every day | ORAL | 2 refills | Status: DC

## 2019-12-05 NOTE — Telephone Encounter (Signed)
Ok to resume 5 mg/day of prednisone. Please send a refill.

## 2019-12-05 NOTE — Telephone Encounter (Signed)
Called spoke with patient who reported about 1 week after stopping her prednisone, she noticed that her shortness of breath began to worsen.  Patient stated that it does worsen throughout the day, and as the evening progresses she gets to the point where she can take minimal steps and needs to sit down to rest.  Patient would like to restart her prednisone.  [facility] [address]  Dr Vaughan Browner please advise, thank you.   Per 4.20.21 visit with Dr Vaughan Browner: Assessment:  Sarcoidosis No evidence of active disease on CT scan of the chest or recent PET scan at [facility] She is off methotrexate and Bactrim We are tapering off steroids as well.  She is currently at 2.5 mg which we will stop after this visit.   Emphysema No obstruction on PFTs but she does have moderate emphysema on CT scan Continue Trelegy inhaler Start duo nebs   Pulmonary hypertension Continues on tadalafil and selexipag.  Heart catheterization last year shows mild to moderate pulmonary hypertension which is an improvement over prior cath values   There is a question of PVOD but I do not see any CT scan findings that are suggestive such as groundglass opacification, mediastinal lymphadenopathy or septal thickening.  We have reviewed her scan at the pulmonary multidisciplinary ILD conference and the radiologist concurred that there is no clear evidence of PVOD   Overall she is pretty symptomatic which is likely multifactorial from the above conditions and frustrated with no improvement.  I am not sure we can add much to her therapies at present. She may benefit from a cardiopulmonary exercise test but she feels that she will not be able to exercise on a bike. Recommended pulmonary rehab but she will not be able to make the trip to [facility] and she does not want to try home PT   Plan/Recommendations: - Continue Trelegy, duo nebs - Stop prednisone

## 2019-12-05 NOTE — Telephone Encounter (Signed)
Spoke with pt, aware of recs.  rx sent to preferred pharmacy.  Nothing further needed at this time- will close encounter.

## 2019-12-13 ENCOUNTER — Ambulatory Visit (HOSPITAL_COMMUNITY)
Admission: RE | Admit: 2019-12-13 | Discharge: 2019-12-13 | Disposition: A | Discharge status: Home / Self Care | Payer: Medicare Other | Source: Ambulatory Visit | Attending: Cardiology | Admitting: Cardiology

## 2019-12-13 ENCOUNTER — Other Ambulatory Visit: Payer: Self-pay

## 2019-12-13 VITALS — BP 112/62 | HR 60 | Wt 173.4 lb

## 2019-12-13 DIAGNOSIS — Z79890 Hormone replacement therapy (postmenopausal): Secondary | ICD-10-CM | POA: Insufficient documentation

## 2019-12-13 DIAGNOSIS — Z7902 Long term (current) use of antithrombotics/antiplatelets: Secondary | ICD-10-CM | POA: Insufficient documentation

## 2019-12-13 DIAGNOSIS — Z7952 Long term (current) use of systemic steroids: Secondary | ICD-10-CM | POA: Insufficient documentation

## 2019-12-13 DIAGNOSIS — J9611 Chronic respiratory failure with hypoxia: Secondary | ICD-10-CM | POA: Diagnosis not present

## 2019-12-13 DIAGNOSIS — M1710 Unilateral primary osteoarthritis, unspecified knee: Secondary | ICD-10-CM | POA: Insufficient documentation

## 2019-12-13 DIAGNOSIS — Z9981 Dependence on supplemental oxygen: Secondary | ICD-10-CM | POA: Insufficient documentation

## 2019-12-13 DIAGNOSIS — Z79899 Other long term (current) drug therapy: Secondary | ICD-10-CM | POA: Diagnosis not present

## 2019-12-13 DIAGNOSIS — E039 Hypothyroidism, unspecified: Secondary | ICD-10-CM | POA: Diagnosis not present

## 2019-12-13 DIAGNOSIS — Z87891 Personal history of nicotine dependence: Secondary | ICD-10-CM | POA: Diagnosis not present

## 2019-12-13 DIAGNOSIS — Z7982 Long term (current) use of aspirin: Secondary | ICD-10-CM | POA: Diagnosis not present

## 2019-12-13 DIAGNOSIS — I2510 Atherosclerotic heart disease of native coronary artery without angina pectoris: Secondary | ICD-10-CM | POA: Insufficient documentation

## 2019-12-13 DIAGNOSIS — I272 Pulmonary hypertension, unspecified: Secondary | ICD-10-CM | POA: Diagnosis not present

## 2019-12-13 DIAGNOSIS — Z8249 Family history of ischemic heart disease and other diseases of the circulatory system: Secondary | ICD-10-CM | POA: Diagnosis not present

## 2019-12-13 DIAGNOSIS — J439 Emphysema, unspecified: Secondary | ICD-10-CM | POA: Insufficient documentation

## 2019-12-13 DIAGNOSIS — I10 Essential (primary) hypertension: Secondary | ICD-10-CM | POA: Diagnosis not present

## 2019-12-13 DIAGNOSIS — E785 Hyperlipidemia, unspecified: Secondary | ICD-10-CM | POA: Diagnosis not present

## 2019-12-13 DIAGNOSIS — Z8349 Family history of other endocrine, nutritional and metabolic diseases: Secondary | ICD-10-CM | POA: Insufficient documentation

## 2019-12-13 NOTE — Patient Instructions (Signed)
No blood work done today.  No medication changes were made. Please continue all current medications as prescribed.  Your physician recommends that you schedule a follow-up appointment in: 6 months. Please contact our office in October for a November appointment.  At the [facility], you and your health needs are our priority. As part of our continuing mission to provide you with exceptional heart care, we have created designated Provider Care Teams. These Care Teams include your primary Cardiologist (physician) and Advanced Practice Providers (APPs- Physician Assistants and Nurse Practitioners) who all work together to provide you with the care you need, when you need it.   You may see any of the following providers on your designated Care Team at your next follow up: Marland Kitchen Dr Glori Bickers . Dr [name] . Darrick Grinder, NP . Lyda Jester, PA . Audry Riles, PharmD   Please be sure to bring in all your medications bottles to every appointment.

## 2019-12-14 ENCOUNTER — Other Ambulatory Visit: Payer: Self-pay | Admitting: Family Medicine

## 2019-12-14 DIAGNOSIS — Z7982 Long term (current) use of aspirin: Secondary | ICD-10-CM | POA: Diagnosis not present

## 2019-12-14 DIAGNOSIS — J849 Interstitial pulmonary disease, unspecified: Secondary | ICD-10-CM | POA: Diagnosis not present

## 2019-12-14 DIAGNOSIS — Z7952 Long term (current) use of systemic steroids: Secondary | ICD-10-CM | POA: Diagnosis not present

## 2019-12-14 DIAGNOSIS — J439 Emphysema, unspecified: Secondary | ICD-10-CM | POA: Diagnosis not present

## 2019-12-14 DIAGNOSIS — I272 Pulmonary hypertension, unspecified: Secondary | ICD-10-CM | POA: Diagnosis not present

## 2019-12-14 DIAGNOSIS — D869 Sarcoidosis, unspecified: Secondary | ICD-10-CM | POA: Diagnosis not present

## 2019-12-14 NOTE — Progress Notes (Signed)
PCP: Dr. Charlett Blake Pulmonary: Dr. Vaughan Browner Cardiology: Dr. Aundra Dubin  74 y.o. with history of sarcoidosis and emphysema was referred by Dr Lake Bells for evaluation of pulmonary hypertension.  She has a diagnosis of sarcoidosis, but CT chest in 3/19 did not show marked parenchymal disease.  PFTs showed very low DLCO.  She had RHC done in 4/19 with moderate pulmonary hypertension and very high PVR.    She is now wearing up to 6L home oxygen by [place] at all times.  She does not smoke, says she quit > 20 years ago.    Echo in 6/19 showed EF 55-60%, mildly dilated RV with moderately decreased systolic function, PASP 83 mmHg, aortic sclerosis without significant stenosis.   She did not tolerate Opsumit.   Echo in 3/20 showed EF 55-60% with mild LVH, mild RV dilation with normal systolic function, PASP 31 mmHg.  Echo was done again in 9/20, showing EF 60-65%.  I reviewed the study, and think that the RV was mildly dilated with normal systolic function. PASP 51 mmHg. IVC normal.   RHC in 10/20 showed mild pulmonary HTN with mean PA pressure 28 mmHg and PVR 2.75 WU. She is now seeing a new pulmonologist at [facility].  FDG PET study was done, this showed no active sarcoidosis and MTX was stopped.  Trelegy was started.   High resolution CT chest in 2/21 showed moderate emphysema, no evidence for sarcoidosis.  CTA chest in 3/21 showed no PE.  I asked pulmonary to review her studies for evidence of PVOD, they did not think that there was evidence for PVOD.    She returns for f/u of pulmonary HTN.  Wears 6-7 L O2 at all times.  She is on Adcirca and selexipag now, really has not felt better on PH meds.  She is still very limited by both knee pain and dyspnea.  She is generally short of breath walking around her house but comfortable at rest.  This has been a long-term pattern. She tried stopping prednisone but got worse, now back on prednisone.  No chest pain, no lightheadedness, no syncope.   6 minute walk (5/19): 122 m 6  minute walk (9/19):  72 m (only walked for about 1 minute).  6 minute walk (10/20): 109 m  ECG (personally reviewed): NSR, nonspecific T wave flattening  Labs (3/19): LDL 53 Labs (4/19): hgb 8.5, K 4.8, creatinine 1.06 Labs (5/19): anti-SCL70 negative, ANA negative, RF negative, ANCA negative, HIV negative, anti-centromere negative.  Labs (6/19): K 4, creatinine 1.03 Labs (9/19): K 4.6, creatinine 1.18 Labs (10/19): TSH normal, K 4.1, creatinine 1. Labs (2/20): LDL 103, K 4.2, creatinine 0.99 Labs (9/20): K 3.9, creatinine 1.06, BNP 137 Labs (10/20): K 4.6, creatinine 1.05  Labs (3/21): K 3.9, creatinine 1.01, pro-BNP 151  PMH: 1. Sarcoidosis: On home oxygen.  - PFTs (3/19): FEV1 84%, FVC 84%, ratio 71%, TLC 75%, DLCO 25% - CT (3/19) with mild fibrotic changes in the lung bases.  - FDG PET (10/20): This was not suggestive of active sarcoidosis.  - High resolution CT chest (2/21): No evidence for sarcoidosis, moderate emphysema.  2. Emphysema: Moderate emphysema on CT chest 3/19 and HRCT in 2/21.  3. CAD: Calcification of coronaries on CT chest in 3/19.  - Cardiolite in 1/19: EF 65%, no ischemia/infarction.  4. HTN 5. Hyperlipidemia 6. Hypothyroidism 7. Anemia 8. Pulmonary hypertension:  - Echo (9/17): EF 60-65%, PASP 44 mmHg.  - RHC (4/19): mean RA 2, PA 66/16 mean 34, mean  PCWP 3, PVR 8.3 WU, CI 2.04 - Autoimmune serologies negative.  - V/Q scan did not show evidence for chronic PE.  - Echo (6/19): EF 55-60%, mildly dilated RV with moderately decreased systolic function, PASP 83 mmHg, aortic sclerosis without significant stenosis.  - Echo (3/20): EF 55-60%, mild LVH, mild RV dilation with normal systolic function, PASP 31 mmHg.  - Echo (9/20): EF 60-65%, mild RV dilation with normal systolic function, PASP 51 mmHg, normal IVC.  - RHC (10/20): mean RA 4, PA 50/14 mean 28, mean PCWP 12, CI 3.15, PVR 2.75 WU 9. OA: Knees.   Review of systems complete and found to be negative  unless listed in HPI.   Social History   Socioeconomic History  . Marital status: Widowed    Spouse name: Not on file  . Number of children: 2  . Years of education: Not on file  . Highest education level: Not on file  Occupational History  . Not on file  Tobacco Use  . Smoking status: Former Smoker    Packs/day: 0.50    Years: 32.00    Pack years: 16.00    Types: Cigarettes    Quit date: 07/22/1994    Years since quitting: 25.4  . Smokeless tobacco: Never Used  Substance and Sexual Activity  . Alcohol use: No    Alcohol/week: 0.0 standard drinks  . Drug use: No  . Sexual activity: Not Currently  Other Topics Concern  . Not on file  Social History Narrative   Married 73 years and has 2 children (2 daughters)   Alcohol Use - no   Former Smoker - she quit 10 years ago, started when she was 44 and has had varying level of use of tobacco products from 1/2 pack to 3 packs per day.   Social Determinants of Health   Financial Resource Strain:   . Difficulty of Paying Living Expenses:   Food Insecurity:   . Worried About Charity fundraiser in the Last Year:   . Arboriculturist in the Last Year:   Transportation Needs:   . Film/video editor (Medical):   Marland Kitchen Lack of Transportation (Non-Medical):   Physical Activity:   . Days of Exercise per Week:   . Minutes of Exercise per Session:   Stress:   . Feeling of Stress :   Social Connections:   . Frequency of Communication with Friends and Family:   . Frequency of Social Gatherings with Friends and Family:   . Attends Religious Services:   . Active Member of Clubs or Organizations:   . Attends Archivist Meetings:   Marland Kitchen Marital Status:   Intimate Partner Violence:   . Fear of Current or Ex-Partner:   . Emotionally Abused:   Marland Kitchen Physically Abused:   . Sexually Abused:    Family History  Problem Relation Age of Onset  . Cancer Mother 42       Breast  . Heart disease Mother   . Hypertension Mother   .  Hyperlipidemia Mother   . Heart attack Mother   . Asthma Maternal Grandmother     Current Outpatient Medications  Medication Sig Dispense Refill  . acetaminophen (TYLENOL) 500 MG tablet Take 1,000 mg by mouth every 6 (six) hours as needed for moderate pain.    Marland Kitchen albuterol (PROVENTIL) (2.5 MG/3ML) 0.083% nebulizer solution Take 3 mLs (2.5 mg total) by nebulization every 6 (six) hours as needed for wheezing or shortness of breath.  30 mL 4  . aspirin EC 81 MG tablet Take 81 mg by mouth at bedtime.     Marland Kitchen atenolol (TENORMIN) 25 MG tablet TAKE 1 TABLET (25 MG TOTAL) BY MOUTH AT BEDTIME. TAKE THE 50 MG ATENOLOL IN AM AND THE 25 MG IN PM 90 tablet 3  . atenolol (TENORMIN) 50 MG tablet TAKE 1 TABLET BY MOUTH EVERY DAY 90 tablet 1  . BESIVANCE 0.6 % SUSP Place 1 drop into the left eye See admin instructions. Only use : 1 DROP INTO LEFT 4 TIMES DAILY x 2 DAYS AFTER EYE INJECTION  12  . calcium carbonate (OSCAL) 1500 (600 Ca) MG TABS tablet Take 1,500 mg by mouth daily with breakfast.     . Cholecalciferol (VITAMIN D) 2000 UNITS tablet Take 2,000 Units by mouth every evening.     . Cinnamon 500 MG capsule Take 2,000 mg by mouth daily.      . citalopram (CELEXA) 10 MG tablet Take 1 tablet (10 mg total) by mouth daily. 30 tablet 0  . Coenzyme Q10 200 MG capsule Take 200 mg by mouth at bedtime.     . fexofenadine (ALLEGRA) 180 MG tablet Take 180 mg by mouth daily.    Marland Kitchen gabapentin (NEURONTIN) 300 MG capsule TAKE 1 CAPSULE BY MOUTH EVERYDAY AT BEDTIME 90 capsule 2  . Glucosamine HCl (GLUCOSAMINE PO) Take 1 tablet by mouth every evening.     . hydrochlorothiazide (MICROZIDE) 12.5 MG capsule TAKE 1 CAPSULE BY MOUTH EVERY DAY 90 capsule 3  . ipratropium-albuterol (DUONEB) 0.5-2.5 (3) MG/3ML SOLN Take 3 mLs by nebulization every 4 (four) hours as needed. 360 mL 11  . ketoconazole (NIZORAL) 2 % cream Apply 1 application topically daily as needed for irritation (face).     Marland Kitchen levothyroxine (SYNTHROID) 112 MCG tablet  Take 1 tablet (112 mcg total) by mouth daily before breakfast. 30 tablet 0  . Omega-3 Fatty Acids (FISH OIL) 1200 MG CAPS Take 1,200 mg by mouth daily.     . predniSONE (DELTASONE) 5 MG tablet Take 1 tablet (5 mg total) by mouth daily with breakfast. 30 tablet 2  . rosuvastatin (CRESTOR) 20 MG tablet TAKE 1 TABLET BY MOUTH EVERY DAY 90 tablet 2  . Spacer/Aero-Holding Chambers (AEROCHAMBER PLUS WITH MASK) inhaler Use as instructed 1 each 2  . tadalafil, PAH, (ADCIRCA) 20 MG tablet Take 2 tablets (40 mg total) by mouth daily. 180 tablet 3  . traMADol (ULTRAM) 50 MG tablet Take 2 tablets (100 mg total) by mouth every 6 (six) hours as needed for moderate pain or severe pain. 120 tablet 0  . TRELEGY ELLIPTA 100-62.5-25 MCG/INH AEPB Inhale 1 puff into the lungs daily.    Marland Kitchen UPTRAVI 1200 MCG TABS Take 2,400 mcg by mouth 2 (two) times daily. 360 tablet 3  . vitamin B-12 (CYANOCOBALAMIN) 1000 MCG tablet Take 1,000 mcg by mouth daily.    Marland Kitchen omeprazole (PRILOSEC) 40 MG capsule TAKE 1 CAPSULE BY MOUTH EVERY DAY 30 capsule 0   No current facility-administered medications for this encounter.   BP 112/62   Pulse 60   Wt 78.7 kg (173 lb 6.4 oz)   SpO2 93% Comment: 6L  BMI 29.76 kg/m   Wt Readings from Last 3 Encounters:  12/13/19 78.7 kg (173 lb 6.4 oz)  11/22/19 78.2 kg (172 lb 6.4 oz)  10/13/19 79.1 kg (174 lb 6.4 oz)   General: NAD Neck: No JVD, no thyromegaly or thyroid nodule.  Lungs: Distant BS CV:  Nondisplaced PMI.  Heart regular S1/S2, no S3/S4, no murmur.  No peripheral edema.  No carotid bruit.  Normal pedal pulses.  Abdomen: Soft, nontender, no hepatosplenomegaly, no distention.  Skin: Intact without lesions or rashes.  Neurologic: Alert and oriented x 3.  Psych: Normal affect. Extremities: No clubbing or cyanosis.  HEENT: Normal.   Assessment/Plan: 1. Pulmonary hypertension: Cath 4/19 with moderate PAH, very high PVR. Right and left heart filling pressures were low.  PFTs in 3/19  showed mild restriction with very low DLCO consistent with pulmonary vascular disease.  V/Q scan with no evidence for chronic PE and autoimmune serologies were negative. High resolution CT in 3/19 showed mild fibrotic changes at the lung bases and a degree of emphysema.  I do not think CT findings can fully explain her PH.  Evaluation so far is not suggestive of sarcoidosis as a cause of PH.  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  Echo 6/19 showed normal LV EF but mildly dilated/moderately dysfunctional RV with severe pulmonary hypertension.  As parenchymal disease did not appear particularly bad on initial CT, we have been trying her on pulmonary vasodilators. She did not tolerate Opsumit.  Now on selexipag and Adcirca but without much symptomatic improvement. However, RHC in 10/20 showed lower PA pressure with PVR 2.75 WU.  She is not volume overloaded on exam. She is still very limited with NYHA class IIIB-IV symptoms. BNP not elevated, do not think that there is significant CHF/volume overload present.  - Continue Adcirca 40 mg daily and selexipag 2400 mcg bid. We discussed stopping them as she feels no better or worse with them.  Given the fact that her PA pressure improved and might worsen if the meds were stopped, we have opted to continue them.  - She did not tolerate Opsumit.  - Given lack of improvement, I was concerned for PVOD.  However, studies were reviewed by my pulmonary colleagues and they do not think that PVOD is likely.   2. Chronic hypoxemic respiratory failure: She has moderate emphysema by imaging.  She is on home oxygen.  Pulmonary hypertension also likely plays a role in low oxygen saturation.  FDG PET did not appear to show active pulmonary sarcoidosis so MTX has been stopped.  She breathes better on prednisone, this could be seen with either COPD/emphysema or sarcoidosis.  - Continue home oxygen.   - Continue prednisone.  - Followup with  Dr. Vaughan Browner.   3. HTN: BP controlled.  - Continue HCTZ 12.5 mg daily and atenolol.   I suspect that the main cause of her symptoms at this point is lung parenchymal disease.  After extensive workup, it appears that the parenchymal disease is primarily emphysema.   Followup in 6 months.   [name], 12/14/2019

## 2019-12-15 ENCOUNTER — Other Ambulatory Visit: Payer: Self-pay | Admitting: Family Medicine

## 2019-12-23 DIAGNOSIS — J441 Chronic obstructive pulmonary disease with (acute) exacerbation: Secondary | ICD-10-CM | POA: Diagnosis not present

## 2019-12-23 DIAGNOSIS — I272 Pulmonary hypertension, unspecified: Secondary | ICD-10-CM | POA: Diagnosis not present

## 2019-12-23 DIAGNOSIS — R0600 Dyspnea, unspecified: Secondary | ICD-10-CM | POA: Diagnosis not present

## 2019-12-23 DIAGNOSIS — D869 Sarcoidosis, unspecified: Secondary | ICD-10-CM | POA: Diagnosis not present

## 2019-12-23 DIAGNOSIS — Z87891 Personal history of nicotine dependence: Secondary | ICD-10-CM | POA: Diagnosis not present

## 2019-12-23 DIAGNOSIS — I10 Essential (primary) hypertension: Secondary | ICD-10-CM | POA: Diagnosis not present

## 2019-12-23 DIAGNOSIS — Z7982 Long term (current) use of aspirin: Secondary | ICD-10-CM | POA: Diagnosis not present

## 2019-12-23 DIAGNOSIS — J439 Emphysema, unspecified: Secondary | ICD-10-CM | POA: Diagnosis not present

## 2019-12-28 ENCOUNTER — Encounter (INDEPENDENT_AMBULATORY_CARE_PROVIDER_SITE_OTHER): Payer: Medicare Other | Admitting: Ophthalmology

## 2019-12-28 ENCOUNTER — Other Ambulatory Visit: Payer: Self-pay

## 2019-12-28 DIAGNOSIS — H34832 Tributary (branch) retinal vein occlusion, left eye, with macular edema: Secondary | ICD-10-CM | POA: Diagnosis not present

## 2019-12-28 DIAGNOSIS — H43813 Vitreous degeneration, bilateral: Secondary | ICD-10-CM | POA: Diagnosis not present

## 2019-12-28 DIAGNOSIS — H35033 Hypertensive retinopathy, bilateral: Secondary | ICD-10-CM | POA: Diagnosis not present

## 2019-12-28 DIAGNOSIS — I10 Essential (primary) hypertension: Secondary | ICD-10-CM | POA: Diagnosis not present

## 2020-01-03 ENCOUNTER — Telehealth (HOSPITAL_COMMUNITY): Payer: Self-pay

## 2020-01-03 NOTE — Telephone Encounter (Signed)
Received a fax requesting medical Records to be sent to [facility]). Records were successfully faxed to: [phone]. Medical request form will be scanned into patients chart.

## 2020-01-05 DIAGNOSIS — R0602 Shortness of breath: Secondary | ICD-10-CM | POA: Diagnosis not present

## 2020-01-07 ENCOUNTER — Other Ambulatory Visit: Payer: Self-pay | Admitting: Family Medicine

## 2020-01-08 ENCOUNTER — Other Ambulatory Visit: Payer: Self-pay | Admitting: Family Medicine

## 2020-02-02 ENCOUNTER — Other Ambulatory Visit: Payer: Self-pay | Admitting: Family Medicine

## 2020-02-03 ENCOUNTER — Other Ambulatory Visit: Payer: Self-pay | Admitting: Family Medicine

## 2020-02-04 ENCOUNTER — Other Ambulatory Visit (HOSPITAL_COMMUNITY): Payer: Self-pay | Admitting: Cardiology

## 2020-02-14 NOTE — Progress Notes (Addendum)
[facility] [place], [place], [place] [address] [phone]  Date:  02/15/2020   Name:  Kimberly Irwin   DOB:  05-22-1946   MRN:  [id number]  PCP:  Mosie Lukes, MD    Chief Complaint: Medication Management (FOLLOW UP, refills)   History of Present Illness:  Kimberly Irwin is a 74 y.o. very pleasant female patient who presents with the following:  Pt of my partner Dr Charlett Blake whom I have not seen in the past History of HTN, COPD, hypoxia, sarcoidosis, hyperlipidemia, Here today to discuss her refills-patient states she was told she could not refill some of her medications at office visit as she has not been seen in about 1 year She is here today with her daughter  Her pulmonologist is Dr Lake Bells. She was also recently sent for a consultation Dr Regan Lemming with [facility] Regarding her pulmonary HTN-his note as follows.  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 -- BMP and proBNP today and resume Lasix and re-check labs on 1 week -- Emphasized importance of salt and fluid restriction to avoid volume overload -- Continue selexipag and tadalafil at current doses for now; if no improvement in dyspnea, will increase prednisone for possible PVOD (to 10 mg daily) and consider reducing selexipag -- 6MWT next visit (in 3 months)  Presumed sarcoidosis vs other granulomatous lung diseases -- Annual spirometry, DLCO -- Annual ophthalmology and EKG (if abnormal EKG or cardiac symptoms, will proceed to cardiac MRI)  Health maintenance: -- Vaccines: UTD -- Smoking cessation: N/A (quit) -- Lung cancer screening: quit 20 years ago, also ageis at upperlimit for USPSTF criteria but more importantly severity of PH with marked limitations in functional status does not support LDCT for lung cancer screening  She has noted tremors since she had her thyroid  removed in the 1960s.  Wonders if I have any ideas of how to treat this.  She is apparently using atenolol for her tremor right now, notes it does not really help much.  Her tremor is mostly just in her head, she will tend to jerk her head to one side.  She denies any family history of tremors  She continues to have shortness of breath, uses oxygen That like to use Lasix every other day to maintain her fluid status and reduce symptoms of shortness of breath, I think this is reasonable Also encouraged him to use daily weights and to use increasing weight as a surrogate marker for fluid retention Patient Active Problem List   Diagnosis Date Noted  . Diarrhea 09/15/2018  . Pulmonary HTN ([place]) 01/28/2018  . Acute on chronic respiratory failure with hypoxia ([employer]) 01/28/2018  . Epistaxis 12/28/2017  . Hypoxia, sleep related 10/21/2016  . Cardiomegaly 07/13/2016  . Coronary artery calcification 07/03/2016  . COPD (chronic obstructive pulmonary disease) ([place]) 07/01/2016  . Hypersomnia 07/01/2016  . Chronic respiratory failure with hypoxia ([place]) 05/20/2016  . Dermatitis 04/14/2016  . SOB (shortness of breath) 04/03/2016  . Hypothyroidism 10/28/2015  . Lumbar compression fracture ([place]) 09/14/2014  . Medicare annual wellness visit, subsequent 04/27/2014  . Obesity   . S/P left THA, AA 06/28/2013  . Decreased visual acuity 02/19/2013  . Hyperglycemia 04/04/2011  . Chronic pain syndrome 01/20/2011  . Depression 08/10/2008  . GERD 04/26/2008  . Sinusitis 08/02/2007  . Lumbar disc disease  05/31/2007  . 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 Hyperlipidemia 05/29/2007  . Tremor   . Essential hypertension     Past Medical History:  Diagnosis Date  . Bursitis of left hip 10/26/2012  . Chronic low back pain   . Chronic respiratory failure with hypoxia ([place]) 10/21/2016  . Coronary artery calcification 07/03/2016  . DDD (degenerative disc disease)    L3-4 with facet arthropathy and stenosis  .  Degenerative spondylolisthesis    L4-5 grade 1 with stenosis  . Emphysema lung ([place])   . GERD (gastroesophageal reflux disease)   . Hiatal hernia   . Hyperlipidemia   . Hypertension   . Hypothyroidism 10/28/2015  . Pain in joint, lower leg 10/26/2012   left   . Pneumonia    as a child several times.   . Pulmonary hypertension ([place])   . Sarcoidosis   . Tremors of nervous system     Past Surgical History:  Procedure Laterality Date  . ANTERIOR LAT LUMBAR FUSION Left 08/29/2014   Procedure: EXTREME LEFT LATERAL INTERBODY FUSION LUMBAR TWO-THREE LATERAL PLATE;  Surgeon: Charlie Pitter, MD;  Location: [facility];  Service: Neurosurgery;  Laterality: Left;  . BACK SURGERY  01/15/09   L3-4 and L4-5 decompressive laminectomy with bilateral L3, L4, and L5 decompressive foraminotomies, more than it would be required for simple interbody fusion alone.  Marland Kitchen BACK SURGERY  01/15/09   L3-4 and L4-5 posterior lumbar interbody fusion utilizing tanget interbody allograft wedge, Telamon interbody PEEk cage, and local autografting.  Marland Kitchen BACK SURGERY  01/15/09   L3, L4, and L5 posterolateral arthrodesis using segmental pedicle screw fixation and local autografting.  Marland Kitchen CARDIAC CATHETERIZATION    . CARPAL TUNNEL RELEASE Bilateral   . COLONOSCOPY    . ESOPHAGOGASTRODUODENOSCOPY    . EYE SURGERY     lens implant  . JOINT REPLACEMENT  09/13/09   Right Hip, Dr. Alvan Dame  . KYPHOPLASTY N/A 09/14/2014   Procedure: Lumbar Two Kyphoplasty/Vertebroplasty;  Surgeon: Charlie Pitter, MD;  Location: [facility];  Service: Neurosurgery;  Laterality: N/A;  Lumbar Two Kyphoplasty/Vertebroplasty  . RIGHT HEART CATH N/A 11/19/2017   Procedure: RIGHT HEART CATH;  Surgeon: Larey Dresser, MD;  Location: [facility];  Service: Cardiovascular;  Laterality: N/A;  . RIGHT HEART CATH N/A 05/27/2019   Procedure: RIGHT HEART CATH;  Surgeon: Larey Dresser, MD;  Location: [facility];  Service: Cardiovascular;  Laterality:  N/A;  . THYROIDECTOMY    . TOTAL HIP ARTHROPLASTY Left 06/28/2013   Procedure: LEFT TOTAL  HIP ARTHROPLASTY ANTERIOR APPROACH;  Surgeon: Mauri Pole, MD;  Location: [facility];  Service: Orthopedics;  Laterality: Left;    Social History   Tobacco Use  . Smoking status: Former Smoker    Packs/day: 0.50    Years: 32.00    Pack years: 16.00    Types: Cigarettes    Quit date: 07/22/1994    Years since quitting: 25.5  . Smokeless tobacco: Never Used  Substance Use Topics  . Alcohol use: No    Alcohol/week: 0.0 standard drinks  . Drug use: No    Family History  Problem Relation Age of Onset  . Cancer Mother 76       Breast  . Heart disease Mother   . Hypertension Mother   . Hyperlipidemia Mother   . Heart attack Mother   . Asthma Maternal Grandmother     Allergies  Allergen Reactions  . Lipitor [  Atorvastatin] Swelling  . Penicillins Swelling    Has patient had a PCN reaction causing immediate rash, facial/tongue/throat swelling, SOB or lightheadedness with hypotension: Yes Has patient had a PCN reaction causing severe rash involving mucus membranes or skin necrosis: No Has patient had a PCN reaction that required hospitalization: No Has patient had a PCN reaction occurring within the last 10 years: No If all of the above answers are "NO", then may proceed with Cephalosporin use.   . Metronidazole Swelling  . Pregabalin Other (See Comments)    REACTION: Somnolence and dizziness  . Simvastatin Other (See Comments)    Leg pain    Medication list has been reviewed and updated.  Current Outpatient Medications on File Prior to Visit  Medication Sig Dispense Refill  . acetaminophen (TYLENOL) 500 MG tablet Take 1,000 mg by mouth every 6 (six) hours as needed for moderate pain.    Marland Kitchen albuterol (PROVENTIL) (2.5 MG/3ML) 0.083% nebulizer solution Take 3 mLs (2.5 mg total) by nebulization every 6 (six) hours as needed for wheezing or shortness of breath. 30 mL 4  . aspirin EC 81  MG tablet Take 81 mg by mouth at bedtime.     Marland Kitchen atenolol (TENORMIN) 25 MG tablet TAKE 1 TABLET (25 MG TOTAL) BY MOUTH AT BEDTIME. TAKE THE 50 MG ATENOLOL IN AM AND THE 25 MG IN PM 90 tablet 3  . atenolol (TENORMIN) 50 MG tablet TAKE 1 TABLET BY MOUTH EVERY DAY 90 tablet 1  . BESIVANCE 0.6 % SUSP Place 1 drop into the left eye See admin instructions. Only use : 1 DROP INTO LEFT 4 TIMES DAILY x 2 DAYS AFTER EYE INJECTION  12  . calcium carbonate (OSCAL) 1500 (600 Ca) MG TABS tablet Take 1,500 mg by mouth daily with breakfast.     . Cholecalciferol (VITAMIN D) 2000 UNITS tablet Take 2,000 Units by mouth every evening.     . Cinnamon 500 MG capsule Take 2,000 mg by mouth daily.      . citalopram (CELEXA) 10 MG tablet TAKE 1 TABLET BY MOUTH EVERY DAY 30 tablet 0  . Coenzyme Q10 200 MG capsule Take 200 mg by mouth at bedtime.     . fexofenadine (ALLEGRA) 180 MG tablet Take 180 mg by mouth daily.    Marland Kitchen gabapentin (NEURONTIN) 300 MG capsule TAKE 1 CAPSULE BY MOUTH EVERYDAY AT BEDTIME 90 capsule 2  . Glucosamine HCl (GLUCOSAMINE PO) Take 1 tablet by mouth every evening.     . hydrochlorothiazide (MICROZIDE) 12.5 MG capsule TAKE 1 CAPSULE BY MOUTH EVERY DAY 90 capsule 3  . ipratropium-albuterol (DUONEB) 0.5-2.5 (3) MG/3ML SOLN Take 3 mLs by nebulization every 4 (four) hours as needed. 360 mL 11  . ketoconazole (NIZORAL) 2 % cream Apply 1 application topically daily as needed for irritation (face).     Marland Kitchen levothyroxine (SYNTHROID) 112 MCG tablet TAKE 1 TABLET (112 MCG TOTAL) BY MOUTH DAILY BEFORE BREAKFAST. 30 tablet 0  . Omega-3 Fatty Acids (FISH OIL) 1200 MG CAPS Take 1,200 mg by mouth daily.     Marland Kitchen omeprazole (PRILOSEC) 40 MG capsule TAKE 1 CAPSULE BY MOUTH EVERY DAY 30 capsule 0  . predniSONE (DELTASONE) 5 MG tablet Take 1 tablet (5 mg total) by mouth daily with breakfast. (Patient taking differently: Take 2.5 mg by mouth daily with breakfast. ) 30 tablet 2  . rosuvastatin (CRESTOR) 20 MG tablet TAKE 1  TABLET BY MOUTH EVERY DAY 90 tablet 2  . Spacer/Aero-Holding Chambers (AEROCHAMBER  PLUS WITH MASK) inhaler Use as instructed 1 each 2  . tadalafil, PAH, (ADCIRCA) 20 MG tablet Take 2 tablets (40 mg total) by mouth daily. 180 tablet 3  . traMADol (ULTRAM) 50 MG tablet Take 2 tablets (100 mg total) by mouth every 6 (six) hours as needed for moderate pain or severe pain. 120 tablet 0  . TRELEGY ELLIPTA 100-62.5-25 MCG/INH AEPB Inhale 1 puff into the lungs daily.    Marland Kitchen UPTRAVI 1200 MCG TABS Take 2,400 mcg by mouth 2 (two) times daily. 360 tablet 3  . vitamin B-12 (CYANOCOBALAMIN) 1000 MCG tablet Take 1,000 mcg by mouth daily.     No current facility-administered medications on file prior to visit.    Review of Systems:  As per HPI- otherwise negative.   Physical Examination: Vitals:   02/15/20 1555  BP: 124/80  Pulse: 83  Resp: 17  SpO2: 97%   Vitals:   02/15/20 1555  Weight: 170 lb (77.1 kg)  Height: _0  (1.626 m)   Body mass index is 29.18 kg/m. Ideal Body Weight: Weight in (lb) to have BMI = 25: 145.3  GEN: no acute distress.  Elderly woman sitting in wheelchair, she has a tremor/tic of her head. No tremors or tics are present in the extremities Using oxygen via nasal cannula HEENT: Atraumatic, Normocephalic.  Ears and Nose: No external deformity. CV: RRR, No M/G/R. No JVD. No thrill. No extra heart sounds. PULM: CTA B, no wheezes, crackles, rhonchi. No retractions. No resp. distress. No accessory muscle use. EXTR: No c/c/e PSYCH: Normally interactive. Conversant.    Assessment and Plan: Essential hypertension, benign - Plan: Basic metabolic panel  Mixed hyperlipidemia - Plan: Lipid panel  Hypothyroidism, unspecified type - Plan: TSH  Tremor - Plan: gabapentin (NEURONTIN) 300 MG capsule  Gastroesophageal reflux disease without esophagitis - Plan: omeprazole (PRILOSEC) 40 MG capsule  Here today to follow-up on a couple of concerns.  Routine labs pending as above,  I will refill her thyroid medicine once her TSH comes in Refilled gabapentin, I presume she uses this for her tremor. I advised her that I do not have any great new ideas about treating her tremor, will be glad to refer to neurology for an opinion if she would like.  For the time being she declines, but will let me know if she would like a referral Refilled omeprazole Will plan further follow- up pending labs.  This visit occurred during the SARS-CoV-2 public health emergency.  Safety protocols were in place, including screening questions prior to the visit, additional usage of staff PPE, and extensive cleaning of exam room while observing appropriate contact time as indicated for disinfecting solutions.   Fill thryoid when TSH comes in  [place], MD  7/15, received labs as below Message to patient Refill thyroid medication Results for orders placed or performed in visit on 02/15/20  TSH  Result Value Ref Range   TSH 4.08 0.35 - 4.50 uIU/mL  Lipid panel  Result Value Ref Range   Cholesterol 151 0 - 200 mg/dL   Triglycerides 189.0 (H) 0 - 149 mg/dL   HDL 42.80 >39.00 mg/dL   VLDL 37.8 0.0 - 40.0 mg/dL   LDL Cholesterol 71 0 - 99 mg/dL   Total CHOL/HDL Ratio 4    NonHDL 223.36   Basic metabolic panel  Result Value Ref Range   Sodium 139 135 - 145 mEq/L   Potassium 4.0 3.5 - 5.1 mEq/L   Chloride 102 96 -  112 mEq/L   CO2 27 19 - 32 mEq/L   Glucose, Bld 182 (H) 70 - 99 mg/dL   BUN 12 6 - 23 mg/dL   Creatinine, Ser 1.00 0.40 - 1.20 mg/dL   GFR 53.93 (L) >60.00 mL/min   Calcium 9.4 8.4 - 10.5 mg/dL

## 2020-02-15 ENCOUNTER — Encounter: Payer: Self-pay | Admitting: Family Medicine

## 2020-02-15 ENCOUNTER — Other Ambulatory Visit: Payer: Self-pay

## 2020-02-15 ENCOUNTER — Encounter (INDEPENDENT_AMBULATORY_CARE_PROVIDER_SITE_OTHER): Payer: Medicare Other | Admitting: Ophthalmology

## 2020-02-15 ENCOUNTER — Ambulatory Visit (INDEPENDENT_AMBULATORY_CARE_PROVIDER_SITE_OTHER): Payer: Medicare Other | Admitting: Family Medicine

## 2020-02-15 VITALS — BP 124/80 | HR 83 | Resp 17 | Ht 64.0 in | Wt 170.0 lb

## 2020-02-15 DIAGNOSIS — I10 Essential (primary) hypertension: Secondary | ICD-10-CM

## 2020-02-15 DIAGNOSIS — R251 Tremor, unspecified: Secondary | ICD-10-CM | POA: Diagnosis not present

## 2020-02-15 DIAGNOSIS — H35033 Hypertensive retinopathy, bilateral: Secondary | ICD-10-CM

## 2020-02-15 DIAGNOSIS — E782 Mixed hyperlipidemia: Secondary | ICD-10-CM | POA: Diagnosis not present

## 2020-02-15 DIAGNOSIS — H43813 Vitreous degeneration, bilateral: Secondary | ICD-10-CM | POA: Diagnosis not present

## 2020-02-15 DIAGNOSIS — E039 Hypothyroidism, unspecified: Secondary | ICD-10-CM

## 2020-02-15 DIAGNOSIS — K219 Gastro-esophageal reflux disease without esophagitis: Secondary | ICD-10-CM | POA: Diagnosis not present

## 2020-02-15 DIAGNOSIS — H34832 Tributary (branch) retinal vein occlusion, left eye, with macular edema: Secondary | ICD-10-CM | POA: Diagnosis not present

## 2020-02-15 MED ORDER — GABAPENTIN 300 MG PO CAPS: ORAL_CAPSULE | ORAL | 1 refills | Status: DC

## 2020-02-15 MED ORDER — OMEPRAZOLE 40 MG PO CPDR: DELAYED_RELEASE_CAPSULE | ORAL | 3 refills | Status: DC

## 2020-02-15 NOTE — Patient Instructions (Signed)
Good to see you today!  I will refill you thyroid pill once your TSH level comes in. Will be in touch with your labs asap

## 2020-02-15 NOTE — Progress Notes (Signed)
Patient placed on 6 L oxygen in office.

## 2020-02-16 ENCOUNTER — Encounter: Payer: Self-pay | Admitting: Family Medicine

## 2020-02-16 LAB — LIPID PANEL
Cholesterol: 151 mg/dL (ref 0–200)
HDL: 42.8 mg/dL (ref >39.00)
LDL Cholesterol: 71 mg/dL (ref 0–99)
NonHDL: 108.5
Total CHOL/HDL Ratio: 4
Triglycerides: 189 mg/dL — ABNORMAL HIGH (ref 0.0–149.0)
VLDL: 37.8 mg/dL (ref 0.0–40.0)

## 2020-02-16 LAB — BASIC METABOLIC PANEL
BUN: 12 mg/dL (ref 6–23)
CO2: 27 mEq/L (ref 19–32)
Calcium: 9.4 mg/dL (ref 8.4–10.5)
Chloride: 102 mEq/L (ref 96–112)
Creatinine, Ser: 1 mg/dL (ref 0.40–1.20)
GFR: 53.93 mL/min — ABNORMAL LOW (ref >60.00)
Glucose, Bld: 182 mg/dL — ABNORMAL HIGH (ref 70–99)
Potassium: 4 mEq/L (ref 3.5–5.1)
Sodium: 139 mEq/L (ref 135–145)

## 2020-02-16 LAB — TSH: TSH: 4.08 u[IU]/mL (ref 0.35–4.50)

## 2020-02-16 MED ORDER — LEVOTHYROXINE SODIUM 112 MCG PO TABS: 112.0000 ug | ORAL_TABLET | Freq: Every day | ORAL | 3 refills | Status: DC

## 2020-02-16 NOTE — Addendum Note (Signed)
Addended by: Lamar Blinks C on: 02/16/2020 09:15 PM   Modules accepted: Orders

## 2020-02-23 ENCOUNTER — Other Ambulatory Visit: Payer: Self-pay | Admitting: Pulmonary Disease

## 2020-02-24 ENCOUNTER — Telehealth: Payer: Self-pay | Admitting: Pulmonary Disease

## 2020-02-24 MED ORDER — PREDNISONE 5 MG PO TABS: 5.0000 mg | ORAL_TABLET | Freq: Every day | ORAL | 2 refills | Status: DC

## 2020-02-24 NOTE — Telephone Encounter (Signed)
Ok to renew prednisone

## 2020-02-24 NOTE — Telephone Encounter (Signed)
Spoke with patient. She is aware Dr. Vaughan Browner has approved the prednisone RX. This has been sent to [facility] on [address].   Nothing further needed at time of call.

## 2020-02-24 NOTE — Telephone Encounter (Signed)
Dr Vaughan Browner, is it ok to continue to refill this patient's prednisone 5 mg daily? Please advise.

## 2020-03-02 ENCOUNTER — Other Ambulatory Visit: Payer: Self-pay | Admitting: Family Medicine

## 2020-03-08 ENCOUNTER — Other Ambulatory Visit: Payer: Self-pay | Admitting: Family Medicine

## 2020-03-27 ENCOUNTER — Encounter: Payer: Self-pay | Admitting: Pulmonary Disease

## 2020-03-27 ENCOUNTER — Other Ambulatory Visit: Payer: Self-pay

## 2020-03-27 ENCOUNTER — Ambulatory Visit (INDEPENDENT_AMBULATORY_CARE_PROVIDER_SITE_OTHER): Payer: Medicare Other | Admitting: Pulmonary Disease

## 2020-03-27 VITALS — BP 114/58 | HR 58 | Temp 98.3°F | Ht 63.0 in | Wt 168.2 lb

## 2020-03-27 DIAGNOSIS — D869 Sarcoidosis, unspecified: Secondary | ICD-10-CM

## 2020-03-27 DIAGNOSIS — J439 Emphysema, unspecified: Secondary | ICD-10-CM

## 2020-03-27 DIAGNOSIS — I272 Pulmonary hypertension, unspecified: Secondary | ICD-10-CM | POA: Diagnosis not present

## 2020-03-27 NOTE — Patient Instructions (Addendum)
I am glad you are stable with regard to your breathing Continue the Trelegy.    Order high-resolution CT from March 2022. Follow-up in clinic after CT

## 2020-03-27 NOTE — Progress Notes (Signed)
Kimberly Irwin    [id number]    10-03-1945  Primary Care Physician:Blyth, Bonnita Levan, MD  Referring Physician: Mosie Lukes, MD [address] [place],  [place] [address]  Chief complaint: Follow-up for COPD, sarcoidosis, pulmonary hypertension  HPI: 74 year old with history of COPD, sarcoidosis, pulmonary hypertension (WHO group 1, group 5) Previously followed by Dr. Lake Bells.  She was diagnosed with sarcoidosis diagnosed by mediastinoscopy in 2003.  Initially not treated as it is felt that she did not have active sarcoid.  Diagnosed with pulmonary hypertension in 2019.  Treated with tadalafil and selexipag.  Unable to tolerate macitentan due to swelling, headache, nausea.  She never really felt any better on Rx for PHTN.  Repeat right heart cath November 2020 shows relatively well-controlled pulmonary hypertension.  Started on prednisone and methotrexate for treatment of sarcoidosis in December 2019 since she continued to have significant symptoms with dyspnea, hypoxemia though no clear evidence of sarcoid involvement of the lungs on imaging.  Also started on Bactrim 3 times a week for PCP prophylaxis.  She said a second opinion at [facility] pulmonary on 05/17/19 with PET scan showing no uptake suggesting no sarcoid activity.  She was taken off methotrexate and Bactrim.  Symbicort was changed to Trelegy.  Continues to have significant dyspnea on exertion, O2 requirements are stable at 4-6L.  Noted to have elevated D-dimer in March 2021 however CTA with no pulmonary embolism.  Pets: Has a dog Occupation: Used to work in Development worker, community Exposures: No known exposures.  No mold, hot tub, Jacuzzi Smoking history: 16-pack-year smoker.  Quit in 1990 Travel history: No significant travel Relevant family history: No significant family history of lung disease  Interim history: Continues on supplemental oxygen.  Continues to have dyspnea on exertion without change No new  complaints today.  Outpatient Encounter Medications as of 03/27/2020  Medication Sig   acetaminophen (TYLENOL) 500 MG tablet Take 1,000 mg by mouth every 6 (six) hours as needed for moderate pain.   albuterol (PROVENTIL) (2.5 MG/3ML) 0.083% nebulizer solution Take 3 mLs (2.5 mg total) by nebulization every 6 (six) hours as needed for wheezing or shortness of breath.   aspirin EC 81 MG tablet Take 81 mg by mouth at bedtime.    atenolol (TENORMIN) 25 MG tablet TAKE 1 TABLET (25 MG TOTAL) BY MOUTH AT BEDTIME. TAKE THE 50 MG ATENOLOL IN AM AND THE 25 MG IN PM   atenolol (TENORMIN) 50 MG tablet TAKE 1 TABLET BY MOUTH EVERY DAY   BESIVANCE 0.6 % SUSP Place 1 drop into the left eye See admin instructions. Only use : 1 DROP INTO LEFT 4 TIMES DAILY x 2 DAYS AFTER EYE INJECTION   calcium carbonate (OSCAL) 1500 (600 Ca) MG TABS tablet Take 1,500 mg by mouth daily with breakfast.    Cholecalciferol (VITAMIN D) 2000 UNITS tablet Take 2,000 Units by mouth every evening.    Cinnamon 500 MG capsule Take 2,000 mg by mouth daily.     citalopram (CELEXA) 10 MG tablet Take 1 tablet (10 mg total) by mouth daily.   Coenzyme Q10 200 MG capsule Take 200 mg by mouth at bedtime.    fexofenadine (ALLEGRA) 180 MG tablet Take 180 mg by mouth daily.   gabapentin (NEURONTIN) 300 MG capsule TAKE 1 CAPSULE BY MOUTH EVERYDAY AT BEDTIME   Glucosamine HCl (GLUCOSAMINE PO) Take 1 tablet by mouth every evening.    hydrochlorothiazide (MICROZIDE) 12.5  MG capsule TAKE 1 CAPSULE BY MOUTH EVERY DAY   ipratropium-albuterol (DUONEB) 0.5-2.5 (3) MG/3ML SOLN Take 3 mLs by nebulization every 4 (four) hours as needed.   ketoconazole (NIZORAL) 2 % cream Apply 1 application topically daily as needed for irritation (face).    levothyroxine (SYNTHROID) 112 MCG tablet Take 1 tablet (112 mcg total) by mouth daily before breakfast.   Omega-3 Fatty Acids (FISH OIL) 1200 MG CAPS Take 1,200 mg by mouth daily.    omeprazole  (PRILOSEC) 40 MG capsule TAKE 1 CAPSULE BY MOUTH EVERY DAY   predniSONE (DELTASONE) 5 MG tablet Take 1 tablet (5 mg total) by mouth daily with breakfast.   rosuvastatin (CRESTOR) 20 MG tablet TAKE 1 TABLET BY MOUTH EVERY DAY   Spacer/Aero-Holding Chambers (AEROCHAMBER PLUS WITH MASK) inhaler Use as instructed   tadalafil, PAH, (ADCIRCA) 20 MG tablet Take 2 tablets (40 mg total) by mouth daily.   traMADol (ULTRAM) 50 MG tablet Take 2 tablets (100 mg total) by mouth every 6 (six) hours as needed for moderate pain or severe pain.   TRELEGY ELLIPTA 100-62.5-25 MCG/INH AEPB Inhale 1 puff into the lungs daily.   UPTRAVI 1200 MCG TABS Take 2,400 mcg by mouth 2 (two) times daily.   vitamin B-12 (CYANOCOBALAMIN) 1000 MCG tablet Take 1,000 mcg by mouth daily.   No facility-administered encounter medications on file as of 03/27/2020.   Physical Exam: Blood pressure (!) 114/58, pulse (!) 58, temperature 98.3 F (36.8 C), temperature source Other (Comment), height _0  (1.6 m), weight 168 lb 3.2 oz (76.3 kg), SpO2 90 %. Gen:      No acute distress HEENT:  EOMI, sclera anicteric Neck:     No masses; no thyromegaly Lungs:    Clear to auscultation bilaterally; normal respiratory effort CV:         Regular rate and rhythm; no murmurs Abd:      + bowel sounds; soft, non-tender; no palpable masses, no distension Ext:    No edema; adequate peripheral perfusion Skin:      Warm and dry; no rash Neuro: alert and oriented x 3 Psych: normal mood and affect,e  Data Reviewed: Imaging: HRCT 10/20/17-  atherosclerosis, mild air trapping, septal thickening RLL, bronchial wall thickening, moderate centrilobular and mild paraseptal emphysema, 6 mm RUL nodule, 3 mm RUL nodule  V/Q scan 12/09/17-  normal perfusion scan  PET scan 06/03/2019 [[facility])-no abnormal FDG uptake, diffuse bronchial wall thickening, trace effusions with mild septal thickening.  HRCT 09/20/2019-unchanged mild basal scarring.  Mild air  trapping, moderate emphysema.  Stable pulmonary nodules  CTA 10/17/2019-no PE, cardiomegaly, small airways disease with moderate emphysema, slight increase in mediastinal lymph nodes. I have reviewed the images personally.  PFTs: 10/19/2017 FVC 2.43 (84%), FEV1 1.83 [84%], F/F 75, TLC 3.83 [75%], DLCO 6.15 [25%] Minimal restriction with severe diffusion defect.  6-minute walk test 05/17/2019-ambulated 232 m, required 6 L of supplemental oxygen  Labs: Mediastinoscopy 06/20/02-  Non necrotizing granulomas A1AT 10/01/16-  118, MM Serology 12/04/17-  ANA, SCL 70, anti centromere, RF all negative  Cardiac:  Echo (9/17): EF 60-65%, PASP 44 mmHg.  - RHC (4/19): mean RA 2, PA 66/16 mean 34, mean PCWP 3, PVR 8.3 WU, CI 2.04 - Autoimmune serologies negative.  - V/Q scan did not show evidence for chronic PE.  - Echo (6/19): EF 55-60%, mildly dilated RV with moderately decreased systolic function, PASP 83 mmHg, aortic sclerosis without significant stenosis.  - Echo (3/20): EF 55-60%, mild LVH,  mild RV dilation with normal systolic function, PASP 31 mmHg.  - Echo (9/20): EF 60-65%, mild RV dilation with normal systolic function, PASP 51 mmHg, normal IVC.  - RHC (10/20): mean RA 4, PA 50/14 mean 28, mean PCWP 12, CI 3.15, PVR 2.75 WU  Assessment:  Sarcoidosis No evidence of active disease on CT scan of the chest or recent PET scan [facility] She is off methotrexate and Bactrim We had attempted taper off steroids but had worsening dyspnea and hence she is back on chronic prednisone at 5 mg.  Emphysema No obstruction on PFTs but she does have moderate emphysema on CT scan Continue Trelegy inhaler Duo nebs as needed  Pulmonary hypertension Continues on tadalafil and selexipag.  Heart catheterization last year shows mild to moderate pulmonary hypertension which is an improvement over prior cath values  There is a question of PVOD but I do not see any CT scan findings that are suggestive such as  groundglass opacification, mediastinal lymphadenopathy or septal thickening.  We have reviewed her scan at the pulmonary multidisciplinary ILD conference and the radiologist concurred that there is no clear evidence of PVOD  Overall she is pretty symptomatic which is likely multifactorial from the above conditions and frustrated with no improvement.  I am not sure we can add much to her therapies at present. She may benefit from a cardiopulmonary exercise test but she feels that she will not be able to exercise on a bike. Recommended pulmonary rehab but she will not be able to make the trip to [facility] and she does not want to try home PT  Plan/Recommendations: - Continue Trelegy, duo nebs - Prednisone 5 mg/day. - Follow-up CT in March 2022  Marshell Garfinkel MD [facility] 03/27/2020, 4:49 PM  CC: Mosie Lukes, MD

## 2020-03-30 DIAGNOSIS — R0600 Dyspnea, unspecified: Secondary | ICD-10-CM | POA: Diagnosis not present

## 2020-03-30 DIAGNOSIS — R0609 Other forms of dyspnea: Secondary | ICD-10-CM | POA: Diagnosis not present

## 2020-04-04 ENCOUNTER — Other Ambulatory Visit: Payer: Self-pay

## 2020-04-04 ENCOUNTER — Encounter (INDEPENDENT_AMBULATORY_CARE_PROVIDER_SITE_OTHER): Payer: Medicare Other | Admitting: Ophthalmology

## 2020-04-04 DIAGNOSIS — H34832 Tributary (branch) retinal vein occlusion, left eye, with macular edema: Secondary | ICD-10-CM | POA: Diagnosis not present

## 2020-04-04 DIAGNOSIS — H35033 Hypertensive retinopathy, bilateral: Secondary | ICD-10-CM | POA: Diagnosis not present

## 2020-04-04 DIAGNOSIS — H43813 Vitreous degeneration, bilateral: Secondary | ICD-10-CM | POA: Diagnosis not present

## 2020-04-04 DIAGNOSIS — I10 Essential (primary) hypertension: Secondary | ICD-10-CM

## 2020-05-15 ENCOUNTER — Other Ambulatory Visit: Payer: Self-pay | Admitting: Family Medicine

## 2020-05-20 ENCOUNTER — Other Ambulatory Visit: Payer: Self-pay | Admitting: Pulmonary Disease

## 2020-05-23 ENCOUNTER — Other Ambulatory Visit: Payer: Self-pay

## 2020-05-23 ENCOUNTER — Encounter (INDEPENDENT_AMBULATORY_CARE_PROVIDER_SITE_OTHER): Payer: Medicare Other | Admitting: Ophthalmology

## 2020-05-23 DIAGNOSIS — H43813 Vitreous degeneration, bilateral: Secondary | ICD-10-CM | POA: Diagnosis not present

## 2020-05-23 DIAGNOSIS — I10 Essential (primary) hypertension: Secondary | ICD-10-CM | POA: Diagnosis not present

## 2020-05-23 DIAGNOSIS — H34832 Tributary (branch) retinal vein occlusion, left eye, with macular edema: Secondary | ICD-10-CM

## 2020-05-23 DIAGNOSIS — H35033 Hypertensive retinopathy, bilateral: Secondary | ICD-10-CM

## 2020-06-16 ENCOUNTER — Encounter: Payer: Self-pay | Admitting: Family Medicine

## 2020-06-16 LAB — HM MAMMOGRAPHY

## 2020-07-13 DIAGNOSIS — J449 Chronic obstructive pulmonary disease, unspecified: Secondary | ICD-10-CM | POA: Diagnosis not present

## 2020-07-13 DIAGNOSIS — I10 Essential (primary) hypertension: Secondary | ICD-10-CM | POA: Diagnosis not present

## 2020-07-13 DIAGNOSIS — D8685 Sarcoid myocarditis: Secondary | ICD-10-CM | POA: Diagnosis not present

## 2020-07-13 DIAGNOSIS — D869 Sarcoidosis, unspecified: Secondary | ICD-10-CM | POA: Diagnosis not present

## 2020-07-13 DIAGNOSIS — Z23 Encounter for immunization: Secondary | ICD-10-CM | POA: Diagnosis not present

## 2020-07-13 DIAGNOSIS — Z87891 Personal history of nicotine dependence: Secondary | ICD-10-CM | POA: Diagnosis not present

## 2020-07-13 DIAGNOSIS — Z7982 Long term (current) use of aspirin: Secondary | ICD-10-CM | POA: Diagnosis not present

## 2020-07-13 DIAGNOSIS — Z79890 Hormone replacement therapy (postmenopausal): Secondary | ICD-10-CM | POA: Diagnosis not present

## 2020-07-13 DIAGNOSIS — R0902 Hypoxemia: Secondary | ICD-10-CM | POA: Diagnosis not present

## 2020-07-13 DIAGNOSIS — I272 Pulmonary hypertension, unspecified: Secondary | ICD-10-CM | POA: Diagnosis not present

## 2020-07-13 DIAGNOSIS — R0609 Other forms of dyspnea: Secondary | ICD-10-CM | POA: Diagnosis not present

## 2020-07-13 DIAGNOSIS — R0600 Dyspnea, unspecified: Secondary | ICD-10-CM | POA: Diagnosis not present

## 2020-07-18 ENCOUNTER — Other Ambulatory Visit: Payer: Self-pay

## 2020-07-18 ENCOUNTER — Encounter (INDEPENDENT_AMBULATORY_CARE_PROVIDER_SITE_OTHER): Payer: Medicare Other | Admitting: Ophthalmology

## 2020-07-18 DIAGNOSIS — I10 Essential (primary) hypertension: Secondary | ICD-10-CM

## 2020-07-18 DIAGNOSIS — H35033 Hypertensive retinopathy, bilateral: Secondary | ICD-10-CM | POA: Diagnosis not present

## 2020-07-18 DIAGNOSIS — H43813 Vitreous degeneration, bilateral: Secondary | ICD-10-CM

## 2020-07-18 DIAGNOSIS — H34832 Tributary (branch) retinal vein occlusion, left eye, with macular edema: Secondary | ICD-10-CM | POA: Diagnosis not present

## 2020-08-09 ENCOUNTER — Other Ambulatory Visit: Payer: Self-pay | Admitting: Family Medicine

## 2020-08-09 ENCOUNTER — Other Ambulatory Visit: Payer: Self-pay | Admitting: Pulmonary Disease

## 2020-08-09 DIAGNOSIS — R251 Tremor, unspecified: Secondary | ICD-10-CM

## 2020-08-15 ENCOUNTER — Other Ambulatory Visit: Payer: Self-pay | Admitting: Family Medicine

## 2020-08-30 ENCOUNTER — Other Ambulatory Visit: Payer: Self-pay | Admitting: Family Medicine

## 2020-09-02 ENCOUNTER — Other Ambulatory Visit: Payer: Self-pay | Admitting: Family Medicine

## 2020-09-14 ENCOUNTER — Ambulatory Visit (INDEPENDENT_AMBULATORY_CARE_PROVIDER_SITE_OTHER): Payer: Medicare Other | Admitting: Medical

## 2020-09-14 ENCOUNTER — Other Ambulatory Visit: Payer: Self-pay

## 2020-09-14 VITALS — BP 112/60 | HR 73 | Temp 97.6°F | Resp 14 | Ht 63.0 in | Wt 167.4 lb

## 2020-09-14 DIAGNOSIS — I10 Essential (primary) hypertension: Secondary | ICD-10-CM | POA: Diagnosis not present

## 2020-09-14 DIAGNOSIS — F32A Depression, unspecified: Secondary | ICD-10-CM | POA: Diagnosis not present

## 2020-09-14 DIAGNOSIS — J439 Emphysema, unspecified: Secondary | ICD-10-CM

## 2020-09-14 DIAGNOSIS — R739 Hyperglycemia, unspecified: Secondary | ICD-10-CM

## 2020-09-14 DIAGNOSIS — R251 Tremor, unspecified: Secondary | ICD-10-CM

## 2020-09-14 DIAGNOSIS — G894 Chronic pain syndrome: Secondary | ICD-10-CM

## 2020-09-14 MED ORDER — LEVOCETIRIZINE DIHYDROCHLORIDE 5 MG PO TABS: 5.0000 mg | ORAL_TABLET | Freq: Every evening | ORAL | 3 refills | Status: DC

## 2020-09-14 MED ORDER — CITALOPRAM HYDROBROMIDE 10 MG PO TABS: 10.0000 mg | ORAL_TABLET | Freq: Every day | ORAL | 11 refills | Status: AC

## 2020-09-14 MED ORDER — GABAPENTIN 300 MG PO CAPS: 300.0000 mg | ORAL_CAPSULE | Freq: Every day | ORAL | 5 refills | Status: AC

## 2020-09-14 NOTE — Patient Instructions (Addendum)
Hx of htn. Well controlled today. Continue hctz.   For copd continue oxygen daily and trelegy. 02 sat 96% today in office.  For chronic pain refilling your gabapentin.  Depression stable. Refilled your celexa.  Follow up 5-6 months or as needed  Future cmp, lipid, cbc and a1c placed.

## 2020-09-14 NOTE — Progress Notes (Signed)
Subjective:    Patient ID: Kimberly Irwin, female    DOB: Dec 10, 1945, 75 y.o.   MRN: [id number]  HPI  Pt in for follow up.  Pt has copd. She is on oxygen and inhalers(trelegy). Sees pulmonologist.   Pt has hx of gerd. Controlled with omeprazole.   History of chronic pain. She is on gabapentin.   Pt needs celexa refill. Has rx that will run out on 09-30-20.    Review of Systems  Constitutional: Negative for chills, fatigue and fever.  Respiratory: Negative for chest tightness, shortness of breath and wheezing.   Cardiovascular: Negative for chest pain and palpitations.  Gastrointestinal: Negative for abdominal pain.  Genitourinary: Negative for dysuria.  Musculoskeletal: Negative for back pain, joint swelling and myalgias.  Skin: Negative for rash.  Neurological: Negative for dizziness, seizures, speech difficulty, weakness and headaches.  Hematological: Negative for adenopathy. Does not bruise/bleed easily.  Psychiatric/Behavioral: Negative for behavioral problems and decreased concentration.     Past Medical History:  Diagnosis Date  . Bursitis of left hip 10/26/2012  . Chronic low back pain   . Chronic respiratory failure with hypoxia ([place]) 10/21/2016  . Coronary artery calcification 07/03/2016  . DDD (degenerative disc disease)    L3-4 with facet arthropathy and stenosis  . Degenerative spondylolisthesis    L4-5 grade 1 with stenosis  . Emphysema lung ([place])   . GERD (gastroesophageal reflux disease)   . Hiatal hernia   . Hyperlipidemia   . Hypertension   . Hypothyroidism 10/28/2015  . Pain in joint, lower leg 10/26/2012   left   . Pneumonia    as a child several times.   . Pulmonary hypertension ([place])   . Sarcoidosis   . Tremors of nervous system      Social History   Socioeconomic History  . Marital status: Widowed    Spouse name: Not on file  . Number of children: 2  . Years of education: Not on file  . Highest education level: Not on file   Occupational History  . Not on file  Tobacco Use  . Smoking status: Former Smoker    Packs/day: 0.50    Years: 32.00    Pack years: 16.00    Types: Cigarettes    Quit date: 07/22/1994    Years since quitting: 26.1  . Smokeless tobacco: Never Used  Substance and Sexual Activity  . Alcohol use: No    Alcohol/week: 0.0 standard drinks  . Drug use: No  . Sexual activity: Not Currently  Other Topics Concern  . Not on file  Social History Narrative   Married 41 years and has 2 children (2 daughters)   Alcohol Use - no   Former Smoker - she quit 10 years ago, started when she was 25 and has had varying level of use of tobacco products from 1/2 pack to 3 packs per day.   Social Determinants of Health   Financial Resource Strain: Not on file  Food Insecurity: Not on file  Transportation Needs: Not on file  Physical Activity: Not on file  Stress: Not on file  Social Connections: Not on file  Intimate Partner Violence: Not on file    Past Surgical History:  Procedure Laterality Date  . ANTERIOR LAT LUMBAR FUSION Left 08/29/2014   Procedure: EXTREME LEFT LATERAL INTERBODY FUSION LUMBAR TWO-THREE LATERAL PLATE;  Surgeon: Charlie Pitter, MD;  Location: [facility];  Service: Neurosurgery;  Laterality: Left;  . BACK SURGERY  01/15/09  L3-4 and L4-5 decompressive laminectomy with bilateral L3, L4, and L5 decompressive foraminotomies, more than it would be required for simple interbody fusion alone.  Marland Kitchen BACK SURGERY  01/15/09   L3-4 and L4-5 posterior lumbar interbody fusion utilizing tanget interbody allograft wedge, Telamon interbody PEEk cage, and local autografting.  Marland Kitchen BACK SURGERY  01/15/09   L3, L4, and L5 posterolateral arthrodesis using segmental pedicle screw fixation and local autografting.  Marland Kitchen CARDIAC CATHETERIZATION    . CARPAL TUNNEL RELEASE Bilateral   . COLONOSCOPY    . ESOPHAGOGASTRODUODENOSCOPY    . EYE SURGERY     lens implant  . JOINT REPLACEMENT  09/13/09   Right Hip,  Dr. Alvan Dame  . KYPHOPLASTY N/A 09/14/2014   Procedure: Lumbar Two Kyphoplasty/Vertebroplasty;  Surgeon: Charlie Pitter, MD;  Location: [facility];  Service: Neurosurgery;  Laterality: N/A;  Lumbar Two Kyphoplasty/Vertebroplasty  . RIGHT HEART CATH N/A 11/19/2017   Procedure: RIGHT HEART CATH;  Surgeon: Larey Dresser, MD;  Location: [facility];  Service: Cardiovascular;  Laterality: N/A;  . RIGHT HEART CATH N/A 05/27/2019   Procedure: RIGHT HEART CATH;  Surgeon: Larey Dresser, MD;  Location: [facility];  Service: Cardiovascular;  Laterality: N/A;  . THYROIDECTOMY    . TOTAL HIP ARTHROPLASTY Left 06/28/2013   Procedure: LEFT TOTAL  HIP ARTHROPLASTY ANTERIOR APPROACH;  Surgeon: Mauri Pole, MD;  Location: [facility];  Service: Orthopedics;  Laterality: Left;    Family History  Problem Relation Age of Onset  . Cancer Mother 62       Breast  . Heart disease Mother   . Hypertension Mother   . Hyperlipidemia Mother   . Heart attack Mother   . Asthma Maternal Grandmother     Allergies  Allergen Reactions  . Lipitor [Atorvastatin] Swelling  . Penicillins Swelling    Has patient had a PCN reaction causing immediate rash, facial/tongue/throat swelling, SOB or lightheadedness with hypotension: Yes Has patient had a PCN reaction causing severe rash involving mucus membranes or skin necrosis: No Has patient had a PCN reaction that required hospitalization: No Has patient had a PCN reaction occurring within the last 10 years: No If all of the above answers are "NO", then may proceed with Cephalosporin use.   . Metronidazole Swelling  . Pregabalin Other (See Comments)    REACTION: Somnolence and dizziness  . Simvastatin Other (See Comments)    Leg pain    Current Outpatient Medications on File Prior to Visit  Medication Sig Dispense Refill  . acetaminophen (TYLENOL) 500 MG tablet Take 1,000 mg by mouth every 6 (six) hours as needed for moderate pain.    Marland Kitchen aspirin EC 81 MG  tablet Take 81 mg by mouth at bedtime.     Marland Kitchen atenolol (TENORMIN) 25 MG tablet TAKE 1 TABLET (25 MG TOTAL) BY MOUTH AT BEDTIME. TAKE THE 50 MG ATENOLOL IN AM AND THE 25 MG IN PM 90 tablet 3  . atenolol (TENORMIN) 50 MG tablet TAKE 1 TABLET BY MOUTH EVERY DAY 90 tablet 1  . BESIVANCE 0.6 % SUSP Place 1 drop into the left eye See admin instructions. Only use : 1 DROP INTO LEFT 4 TIMES DAILY x 2 DAYS AFTER EYE INJECTION  12  . calcium carbonate (OSCAL) 1500 (600 Ca) MG TABS tablet Take 1,500 mg by mouth daily with breakfast.     . Cholecalciferol (VITAMIN D) 2000 UNITS tablet Take 2,000 Units by mouth every evening.     Marland Kitchen  Cinnamon 500 MG capsule Take 2,000 mg by mouth daily.    . citalopram (CELEXA) 10 MG tablet Take 1 tablet (10 mg total) by mouth daily. 30 tablet 0  . Coenzyme Q10 200 MG capsule Take 200 mg by mouth at bedtime.     . fexofenadine (ALLEGRA) 180 MG tablet Take 180 mg by mouth daily.    . furosemide (LASIX) 20 MG tablet Take by mouth.    . gabapentin (NEURONTIN) 300 MG capsule Take 1 capsule (300 mg total) by mouth at bedtime. 30 capsule 0  . Glucosamine HCl (GLUCOSAMINE PO) Take 1 tablet by mouth every evening.     . hydrochlorothiazide (MICROZIDE) 12.5 MG capsule TAKE 1 CAPSULE BY MOUTH EVERY DAY 90 capsule 3  . ketoconazole (NIZORAL) 2 % cream Apply 1 application topically daily as needed for irritation (face).     Marland Kitchen levothyroxine (SYNTHROID) 112 MCG tablet Take 1 tablet (112 mcg total) by mouth daily before breakfast. 90 tablet 3  . Omega-3 Fatty Acids (FISH OIL) 1200 MG CAPS Take 1,200 mg by mouth daily.    Marland Kitchen omeprazole (PRILOSEC) 40 MG capsule TAKE 1 CAPSULE BY MOUTH EVERY DAY 90 capsule 3  . potassium chloride (KLOR-CON) 10 MEQ tablet Take by mouth.    . predniSONE (DELTASONE) 5 MG tablet TAKE 1 TABLET BY MOUTH EVERY DAY WITH BREAKFAST 30 tablet 2  . rosuvastatin (CRESTOR) 20 MG tablet TAKE 1 TABLET BY MOUTH EVERY DAY 90 tablet 2  . Spacer/Aero-Holding Chambers (AEROCHAMBER  PLUS WITH MASK) inhaler Use as instructed 1 each 2  . tadalafil, PAH, (ADCIRCA) 20 MG tablet Take 2 tablets (40 mg total) by mouth daily. 180 tablet 3  . traMADol (ULTRAM) 50 MG tablet Take 2 tablets (100 mg total) by mouth every 6 (six) hours as needed for moderate pain or severe pain. 120 tablet 0  . TRELEGY ELLIPTA 100-62.5-25 MCG/INH AEPB Inhale 1 puff into the lungs daily.    Marland Kitchen UPTRAVI 1200 MCG TABS Take 2,400 mcg by mouth 2 (two) times daily. 360 tablet 3  . vitamin B-12 (CYANOCOBALAMIN) 1000 MCG tablet Take 1,000 mcg by mouth daily.     No current facility-administered medications on file prior to visit.    BP 112/60 (BP Location: Left Arm, Patient Position: Sitting, Cuff Size: Normal)   Pulse 73   Temp 97.6 F (36.4 C) (Oral)   Resp 14   Wt 167 lb 6.4 oz (75.9 kg)   SpO2 92% Comment: on 7L of our 02 here in office  BMI 29.65 kg/m       Objective:   Physical Exam  General Mental Status- Alert. General Appearance- Not in acute distress.   Skin General: Color- Normal Color. Moisture- Normal Moisture.  Neck Carotid Arteries- Normal color. Moisture- Normal Moisture. No carotid bruits. No JVD.  Chest and Lung Exam Auscultation: Breath Sounds:-Normal.  Cardiovascular Auscultation:Rythm- Regular. Murmurs & Other Heart Sounds:Auscultation of the heart reveals- No Murmurs.  Abdomen Inspection:-Inspeection Normal. Palpation/Percussion:Note:No mass. Palpation and Percussion of the abdomen reveal- Non Tender, Non Distended + BS, no rebound or guarding.   Neurologic Cranial Nerve exam:- CN III-XII intact(No nystagmus), symmetric smile. Strength:- 5/5 equal and symmetric strength both upper and lower extremities.  Lower ext- no pedal edema.     Assessment & Plan:  Hx of htn. Well controlled today. Continue hctz.   For copd continue oxygen daily and trelegy. 02 sat 96% today in office.  For chronic pain refilling your gabapentin.  Depression stable. Refilled  your  celexa.  Follow up 5-6 months or as needed  [employer]

## 2020-09-19 ENCOUNTER — Other Ambulatory Visit: Payer: Self-pay

## 2020-09-19 ENCOUNTER — Encounter (INDEPENDENT_AMBULATORY_CARE_PROVIDER_SITE_OTHER): Payer: Medicare Other | Admitting: Ophthalmology

## 2020-09-19 DIAGNOSIS — H34832 Tributary (branch) retinal vein occlusion, left eye, with macular edema: Secondary | ICD-10-CM | POA: Diagnosis not present

## 2020-09-19 DIAGNOSIS — H43813 Vitreous degeneration, bilateral: Secondary | ICD-10-CM

## 2020-09-19 DIAGNOSIS — I10 Essential (primary) hypertension: Secondary | ICD-10-CM

## 2020-09-19 DIAGNOSIS — H35033 Hypertensive retinopathy, bilateral: Secondary | ICD-10-CM | POA: Diagnosis not present

## 2020-09-26 ENCOUNTER — Telehealth (HOSPITAL_COMMUNITY): Payer: Self-pay | Admitting: Pharmacy Technician

## 2020-09-26 NOTE — Telephone Encounter (Signed)
Advanced Heart Failure Patient Advocate Encounter   Patient was approved to receive Uptravi from [employer]  Patient ID: [id number] Effective dates: through 08/03/21  Charlann Boxer, CPhT

## 2020-10-08 ENCOUNTER — Other Ambulatory Visit: Payer: Self-pay

## 2020-10-08 ENCOUNTER — Ambulatory Visit (HOSPITAL_COMMUNITY)
Admission: RE | Admit: 2020-10-08 | Discharge: 2020-10-08 | Disposition: A | Discharge status: Home / Self Care | Payer: Medicare Other | Source: Ambulatory Visit | Attending: Pulmonary Disease | Admitting: Pulmonary Disease

## 2020-10-08 ENCOUNTER — Encounter (HOSPITAL_COMMUNITY): Payer: Self-pay

## 2020-10-08 DIAGNOSIS — J439 Emphysema, unspecified: Secondary | ICD-10-CM | POA: Diagnosis not present

## 2020-10-08 DIAGNOSIS — R0602 Shortness of breath: Secondary | ICD-10-CM | POA: Diagnosis not present

## 2020-10-16 ENCOUNTER — Other Ambulatory Visit (HOSPITAL_COMMUNITY): Payer: Self-pay | Admitting: Cardiology

## 2020-10-21 ENCOUNTER — Other Ambulatory Visit (HOSPITAL_COMMUNITY): Payer: Self-pay | Admitting: Cardiology

## 2020-10-31 DIAGNOSIS — D8685 Sarcoid myocarditis: Secondary | ICD-10-CM | POA: Diagnosis not present

## 2020-11-04 ENCOUNTER — Other Ambulatory Visit: Payer: Self-pay | Admitting: Pulmonary Disease

## 2020-11-23 DIAGNOSIS — I272 Pulmonary hypertension, unspecified: Secondary | ICD-10-CM | POA: Diagnosis not present

## 2020-11-23 DIAGNOSIS — D869 Sarcoidosis, unspecified: Secondary | ICD-10-CM | POA: Diagnosis not present

## 2020-11-23 DIAGNOSIS — Z7982 Long term (current) use of aspirin: Secondary | ICD-10-CM | POA: Diagnosis not present

## 2020-11-23 DIAGNOSIS — Z87891 Personal history of nicotine dependence: Secondary | ICD-10-CM | POA: Diagnosis not present

## 2020-11-23 DIAGNOSIS — R0902 Hypoxemia: Secondary | ICD-10-CM | POA: Diagnosis not present

## 2020-11-23 DIAGNOSIS — J439 Emphysema, unspecified: Secondary | ICD-10-CM | POA: Diagnosis not present

## 2020-11-23 DIAGNOSIS — I10 Essential (primary) hypertension: Secondary | ICD-10-CM | POA: Diagnosis not present

## 2020-11-23 DIAGNOSIS — R0600 Dyspnea, unspecified: Secondary | ICD-10-CM | POA: Diagnosis not present

## 2020-11-27 ENCOUNTER — Other Ambulatory Visit (HOSPITAL_COMMUNITY)
Admission: RE | Admit: 2020-11-27 | Discharge: 2020-11-27 | Disposition: A | Discharge status: Home / Self Care | Payer: Medicare Other | Source: Ambulatory Visit | Attending: Pulmonary Disease | Admitting: Pulmonary Disease

## 2020-11-27 DIAGNOSIS — Z01812 Encounter for preprocedural laboratory examination: Secondary | ICD-10-CM | POA: Insufficient documentation

## 2020-11-27 DIAGNOSIS — Z20822 Contact with and (suspected) exposure to covid-19: Secondary | ICD-10-CM | POA: Insufficient documentation

## 2020-11-28 LAB — SARS CORONAVIRUS 2 (TAT 6-24 HRS): SARS Coronavirus 2: NEGATIVE

## 2020-11-29 ENCOUNTER — Other Ambulatory Visit: Payer: Self-pay | Admitting: *Deleted

## 2020-11-29 DIAGNOSIS — D869 Sarcoidosis, unspecified: Secondary | ICD-10-CM

## 2020-11-30 ENCOUNTER — Ambulatory Visit (INDEPENDENT_AMBULATORY_CARE_PROVIDER_SITE_OTHER): Payer: Medicare Other | Admitting: Pulmonary Disease

## 2020-11-30 ENCOUNTER — Other Ambulatory Visit: Payer: Self-pay

## 2020-11-30 ENCOUNTER — Encounter: Payer: Self-pay | Admitting: Pulmonary Disease

## 2020-11-30 VITALS — BP 114/66 | HR 65 | Temp 97.7°F | Ht 64.0 in | Wt 164.0 lb

## 2020-11-30 DIAGNOSIS — I272 Pulmonary hypertension, unspecified: Secondary | ICD-10-CM | POA: Diagnosis not present

## 2020-11-30 DIAGNOSIS — D869 Sarcoidosis, unspecified: Secondary | ICD-10-CM | POA: Diagnosis not present

## 2020-11-30 DIAGNOSIS — J439 Emphysema, unspecified: Secondary | ICD-10-CM | POA: Diagnosis not present

## 2020-11-30 LAB — PULMONARY FUNCTION TEST
DL/VA % pred: 34 %
DL/VA: 1.43 ml/min/mmHg/L
DLCO cor % pred: 25 %
DLCO cor: 4.9 ml/min/mmHg
DLCO unc % pred: 25 %
DLCO unc: 4.9 ml/min/mmHg
FEF 25-75 Post: 1.25 L/sec
FEF 25-75 Pre: 1.25 L/sec
FEF2575-%Change-Post: 0 %
FEF2575-%Pred-Post: 77 %
FEF2575-%Pred-Pre: 77 %
FEV1-%Change-Post: 0 %
FEV1-%Pred-Post: 73 %
FEV1-%Pred-Pre: 72 %
FEV1-Post: 1.52 L
FEV1-Pre: 1.51 L
FEV1FVC-%Change-Post: -2 %
FEV1FVC-%Pred-Pre: 101 %
FEV6-%Change-Post: 2 %
FEV6-%Pred-Post: 77 %
FEV6-%Pred-Pre: 75 %
FEV6-Post: 2.04 L
FEV6-Pre: 1.99 L
FEV6FVC-%Pred-Post: 105 %
FEV6FVC-%Pred-Pre: 105 %
FVC-%Change-Post: 2 %
FVC-%Pred-Post: 73 %
FVC-%Pred-Pre: 71 %
FVC-Post: 2.04 L
FVC-Pre: 1.99 L
Post FEV1/FVC ratio: 74 %
Post FEV6/FVC ratio: 100 %
Pre FEV1/FVC ratio: 76 %
Pre FEV6/FVC Ratio: 100 %
RV % pred: 49 %
RV: 1.14 L
TLC % pred: 70 %
TLC: 3.56 L

## 2020-11-30 NOTE — Progress Notes (Signed)
Full PFT performed today.

## 2020-11-30 NOTE — Patient Instructions (Signed)
We will get labs today to evaluate interstitial lung disease Check comprehensive metabolic panel  Start paperwork for Esbriet  Follow-up in 3 months.

## 2020-11-30 NOTE — Progress Notes (Signed)
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 Lake Bells.  She was diagnosed with sarcoidosis diagnosed by mediastinoscopy in 2003.  Initially not treated as it is felt that she did not have active sarcoid.  Diagnosed with pulmonary hypertension in 2019.  Treated with tadalafil and selexipag.  Unable to tolerate macitentan due to swelling, headache, nausea.  She never really felt any better on Rx for PHTN.  Repeat right heart cath November 2020 shows relatively well-controlled pulmonary hypertension.  Started on prednisone and methotrexate for treatment of sarcoidosis in December 2019 since she continued to have significant symptoms with dyspnea, hypoxemia though no clear evidence of sarcoid involvement of the lungs on imaging.  Also started on Bactrim 3 times a week for PCP prophylaxis.  She said a second opinion at [facility] pulmonary on 05/17/19 with PET scan showing no uptake suggesting no sarcoid activity.  She was taken off methotrexate and Bactrim.  Symbicort was changed to Trelegy.  Continues to have significant dyspnea on exertion, O2 requirements are stable at 4-6L.  Noted to have elevated D-dimer in March 2021 however CTA with no pulmonary embolism.  Pets: Has a dog Occupation: Used to work in Development worker, community Exposures: No known exposures.  No mold, hot tub, Jacuzzi Smoking history: 16-pack-year smoker.  Quit in 1990 Travel history: No significant travel Relevant family history: No significant family history of lung disease  Interim history: Feels that breathing is doing bad.  Has chronic dyspnea on exertion which may be worse compared  to before On 8 L supplemental oxygen  Denies any cough, sputum production, fevers, chills   Outpatient Encounter Medications as of 11/30/2020  Medication Sig  . acetaminophen (TYLENOL) 500 MG tablet Take 1,000 mg by mouth every 6 (six) hours as needed for moderate pain.  Marland Kitchen aspirin EC 81 MG tablet Take 81 mg by mouth at bedtime.   Marland Kitchen atenolol (TENORMIN) 25 MG tablet TAKE 1 TABLET (25 MG TOTAL) BY MOUTH AT BEDTIME. TAKE THE 50 MG ATENOLOL IN AM AND THE 25 MG IN PM  . atenolol (TENORMIN) 50 MG tablet TAKE 1 TABLET BY MOUTH EVERY DAY  . BESIVANCE 0.6 % SUSP Place 1 drop into the left eye See admin instructions. Only use : 1 DROP INTO LEFT 4 TIMES DAILY x 2 DAYS AFTER EYE INJECTION  . calcium carbonate (OSCAL) 1500 (600 Ca) MG TABS tablet Take 1,500 mg by mouth daily with breakfast.   . Cholecalciferol (VITAMIN D) 2000 UNITS tablet Take 2,000 Units by mouth every evening.   . Cinnamon 500 MG capsule Take 2,000 mg by mouth daily.  . citalopram (CELEXA) 10 MG tablet Take 1 tablet (10 mg total) by mouth daily.  . Coenzyme Q10 200 MG capsule Take 200 mg by mouth at bedtime.   . furosemide (LASIX) 20 MG tablet Take by mouth.  . gabapentin (NEURONTIN) 300 MG capsule Take 1 capsule (300 mg total) by mouth at bedtime.  . Glucosamine HCl (GLUCOSAMINE PO) Take 1 tablet by mouth every evening.   . hydrochlorothiazide (MICROZIDE) 12.5 MG capsule TAKE 1 CAPSULE BY MOUTH EVERY DAY  . ketoconazole (NIZORAL) 2 % cream  Apply 1 application topically daily as needed for irritation (face).   Marland Kitchen levocetirizine (XYZAL) 5 MG tablet Take 1 tablet (5 mg total) by mouth every evening.  Marland Kitchen levothyroxine (SYNTHROID) 112 MCG tablet Take 1 tablet (112 mcg total) by mouth daily before breakfast.  . Omega-3 Fatty Acids (FISH OIL) 1200 MG CAPS Take 1,200 mg by mouth daily.  Marland Kitchen omeprazole (PRILOSEC) 40 MG capsule TAKE 1 CAPSULE BY MOUTH EVERY DAY  . potassium chloride (KLOR-CON) 10 MEQ tablet Take by mouth.  . predniSONE  (DELTASONE) 5 MG tablet TAKE 1 TABLET BY MOUTH EVERY DAY WITH BREAKFAST  . rosuvastatin (CRESTOR) 20 MG tablet TAKE 1 TABLET BY MOUTH EVERY DAY  . Selexipag (UPTRAVI) 1200 MCG TABS Take 2,400 mcg by mouth 2 (two) times daily. Needs appt for further refills  . Spacer/Aero-Holding Chambers (AEROCHAMBER PLUS WITH MASK) inhaler Use as instructed  . tadalafil, PAH, (ADCIRCA) 20 MG tablet TAKE 2 TABLETS BY MOUTH EVERY DAY  . traMADol (ULTRAM) 50 MG tablet Take 2 tablets (100 mg total) by mouth every 6 (six) hours as needed for moderate pain or severe pain.  . TRELEGY ELLIPTA 100-62.5-25 MCG/INH AEPB Inhale 1 puff into the lungs daily.  . vitamin B-12 (CYANOCOBALAMIN) 1000 MCG tablet Take 1,000 mcg by mouth daily.   No facility-administered encounter medications on file as of 11/30/2020.   Physical Exam: Blood pressure 114/66, pulse 65, temperature 97.7 F (36.5 C), temperature source Temporal, height _0  (1.626 m), weight 164 lb (74.4 kg), SpO2 92 %. Gen:      No acute distress HEENT:  EOMI, sclera anicteric Neck:     No masses; no thyromegaly Lungs:    Bibasal crackles CV:         Regular rate and rhythm; no murmurs Abd:      + bowel sounds; soft, non-tender; no palpable masses, no distension Ext:    No edema; adequate peripheral perfusion Skin:      Warm and dry; no rash Neuro: alert and oriented x 3 Psych: normal mood and affect  Data Reviewed: Imaging: HRCT 10/20/17-  atherosclerosis, mild air trapping, septal thickening RLL, bronchial wall thickening, moderate centrilobular and mild paraseptal emphysema, 6 mm RUL nodule, 3 mm RUL nodule  V/Q scan 12/09/17-  normal perfusion scan  PET scan 06/03/2019 [[facility])-no abnormal FDG uptake, diffuse bronchial wall thickening, trace effusions with mild septal thickening.  HRCT 09/20/2019-unchanged mild basal scarring.  Mild air trapping, moderate emphysema.  Stable pulmonary nodules  CTA 10/17/2019-no PE, cardiomegaly, small airways disease with  moderate emphysema, slight increase in mediastinal lymph nodes.  High-res CT 10/08/2020- subtle progression of fibrotic changes, alternate pattern.  Moderate emphysema, stable pulmonary nodules, aortic and coronary atherosclerosis I have reviewed the images personally.  PFTs: 10/19/2017 FVC 2.43 (84%), FEV1 1.83 [84%], F/F 75, TLC 3.83 [75%], DLCO 6.15 [25%] Minimal restriction with severe diffusion defect.  6-minute walk test 05/17/2019-ambulated 232 m, required 6 L of supplemental oxygen  Labs: Mediastinoscopy 06/20/02-  Non necrotizing granulomas A1AT 10/01/16-  118, MM Serology 12/04/17-  ANA, SCL 70, anti centromere, RF all negative  Cardiac:  Echo (9/17): EF 60-65%, PASP 44 mmHg.  - RHC (4/19): mean RA 2, PA 66/16 mean 34, mean PCWP 3, PVR 8.3 WU, CI 2.04 - Autoimmune serologies negative.  - V/Q scan did not show evidence for chronic PE.  - Echo (6/19): EF 55-60%, mildly dilated RV with moderately decreased systolic function, PASP 83 mmHg, aortic sclerosis without significant stenosis.  - Echo (  3/20): EF 55-60%, mild LVH, mild RV dilation with normal systolic function, PASP 31 mmHg.  - Echo (9/20): EF 60-65%, mild RV dilation with normal systolic function, PASP 51 mmHg, normal IVC.  - RHC (10/20): mean RA 4, PA 50/14 mean 28, mean PCWP 12, CI 3.15, PVR 2.75 WU  Assessment:  Sarcoidosis No evidence of active disease on CT scan of the chest on previous PET scan at [facility] She is off methotrexate and Bactrim We had attempted taper off steroids but had worsening dyspnea and hence she is back on chronic prednisone at 5 mg.  Now high-res CT showing subtle increase in fibrotic changes in alternate pattern and PFTs show worse restriction and diffusion impairment Will reevaluate with ILD panel I had a long discussion with patient and daughter today in office.  Given progression of pulmonary fibrosis we will start Esbriet.  Prefer this over Ofev as she already has diarrhea  Emphysema No  obstruction on PFTs but she does have moderate emphysema on CT scan Continue Trelegy inhaler Duo nebs as needed  Pulmonary hypertension Continues on tadalafil and selexipag.  Heart catheterization last year shows mild to moderate pulmonary hypertension which is an improvement over prior cath values  There is a question of PVOD but CT scan findings that are not suggestive such as groundglass opacification, mediastinal lymphadenopathy or septal thickening.  We have reviewed her scan at the pulmonary multidisciplinary ILD conference  Overall she is pretty symptomatic which is likely multifactorial from the above conditions and frustrated with no improvement.   Plan/Recommendations: - Continue Trelegy, duo nebs - Prednisone 5 mg/day. - Check CTD serologies, comprehensive metabolic panel - Paperwork for Esbriet  Marshell Garfinkel MD [facility] 11/30/2020, 2:49 PM  CC: Mosie Lukes, MD

## 2020-11-30 NOTE — Patient Instructions (Signed)
Full PFT performed today.

## 2020-12-01 LAB — CBC WITH DIFFERENTIAL/PLATELET
Absolute Monocytes: 498 cells/uL (ref 200–950)
Basophils Absolute: 50 cells/uL (ref 0–200)
Basophils Relative: 0.6 %
Eosinophils Absolute: 42 cells/uL (ref 15–500)
Eosinophils Relative: 0.5 %
HCT: 40.7 % (ref 35.0–45.0)
Hemoglobin: 13.8 g/dL (ref 11.7–15.5)
Lymphs Abs: 1046 cells/uL (ref 850–3900)
MCH: 30.2 pg (ref 27.0–33.0)
MCHC: 33.9 g/dL (ref 32.0–36.0)
MCV: 89.1 fL (ref 80.0–100.0)
MPV: 11.4 fL (ref 7.5–12.5)
Monocytes Relative: 6 %
Neutro Abs: 6665 cells/uL (ref 1500–7800)
Neutrophils Relative %: 80.3 %
Platelets: 163 10*3/uL (ref 140–400)
RBC: 4.57 10*6/uL (ref 3.80–5.10)
RDW: 14.2 % (ref 11.0–15.0)
Total Lymphocyte: 12.6 %
WBC: 8.3 10*3/uL (ref 3.8–10.8)

## 2020-12-01 LAB — COMPREHENSIVE METABOLIC PANEL
AG Ratio: 1.8 (calc) (ref 1.0–2.5)
ALT: 12 U/L (ref 6–29)
AST: 15 U/L (ref 10–35)
Albumin: 4.2 g/dL (ref 3.6–5.1)
Alkaline phosphatase (APISO): 41 U/L (ref 37–153)
BUN/Creatinine Ratio: 16 (calc) (ref 6–22)
BUN: 19 mg/dL (ref 7–25)
CO2: 26 mmol/L (ref 20–32)
Calcium: 9.6 mg/dL (ref 8.6–10.4)
Chloride: 102 mmol/L (ref 98–110)
Creat: 1.2 mg/dL — ABNORMAL HIGH (ref 0.60–0.93)
Globulin: 2.3 g/dL (calc) (ref 1.9–3.7)
Glucose, Bld: 175 mg/dL — ABNORMAL HIGH (ref 65–99)
Potassium: 3.4 mmol/L — ABNORMAL LOW (ref 3.5–5.3)
Sodium: 140 mmol/L (ref 135–146)
Total Bilirubin: 0.7 mg/dL (ref 0.2–1.2)
Total Protein: 6.5 g/dL (ref 6.1–8.1)

## 2020-12-04 ENCOUNTER — Telehealth: Payer: Self-pay

## 2020-12-04 NOTE — Telephone Encounter (Signed)
Received notification of Esbriet New Start. We have a completed copy of the Patient form already, awaiting the Provider form.

## 2020-12-05 NOTE — Telephone Encounter (Signed)
Submitted a Prior Authorization request to [facility] for ESBRIET via CoverMyMeds. Will update once we receive a response.   KeyAlicia Amel - [id number]

## 2020-12-06 ENCOUNTER — Other Ambulatory Visit: Payer: Self-pay | Admitting: Medical

## 2020-12-06 LAB — HYPERSENSITIVITY PNUEMONITIS PROFILE
ASPERGILLUS FUMIGATUS: NEGATIVE
Faenia retivirgula: NEGATIVE
Pigeon Serum: NEGATIVE
S. VIRIDIS: NEGATIVE
T. CANDIDUS: NEGATIVE
T. VULGARIS: NEGATIVE

## 2020-12-06 LAB — ANA,IFA RA DIAG PNL W/RFLX TIT/PATN
Anti Nuclear Antibody (ANA): POSITIVE — AB
Cyclic Citrullin Peptide Ab: <16 UNITS
Rheumatoid fact SerPl-aCnc: <14 IU/mL (ref <14)

## 2020-12-06 LAB — ANTI-SCLERODERMA ANTIBODY: Scleroderma (Scl-70) (ENA) Antibody, IgG: <1 AI

## 2020-12-06 LAB — ANCA SCREEN W REFLEX TITER: ANCA Screen: NEGATIVE

## 2020-12-06 LAB — ANTI-NUCLEAR AB-TITER (ANA TITER): ANA Titer 1: 1:40 {titer} — ABNORMAL HIGH

## 2020-12-06 LAB — SJOGRENS SYNDROME-B EXTRACTABLE NUCLEAR ANTIBODY: SSB (La) (ENA) Antibody, IgG: <1 AI

## 2020-12-06 LAB — SJOGRENS SYNDROME-A EXTRACTABLE NUCLEAR ANTIBODY: SSA (Ro) (ENA) Antibody, IgG: <1 AI

## 2020-12-06 NOTE — Telephone Encounter (Signed)
Received a fax regarding Prior Authorization from [facility] for [place]. Authorization has been DENIED because:  Esbriet is not FDA approved for your medical condition(s): Interstitial lung disease with progressive fibrotic phenotype in diseases classified elsewhere. These condition(s) are not supported by one of the accepted references. Therefore your drug is denied because it is not being used for a "medically accepted indication."  Appeal Fax: [phone] Phone: [phone]

## 2020-12-12 ENCOUNTER — Encounter (INDEPENDENT_AMBULATORY_CARE_PROVIDER_SITE_OTHER): Payer: Medicare Other | Admitting: Ophthalmology

## 2020-12-12 ENCOUNTER — Other Ambulatory Visit: Payer: Self-pay

## 2020-12-12 DIAGNOSIS — H34832 Tributary (branch) retinal vein occlusion, left eye, with macular edema: Secondary | ICD-10-CM

## 2020-12-12 DIAGNOSIS — H43813 Vitreous degeneration, bilateral: Secondary | ICD-10-CM

## 2020-12-12 DIAGNOSIS — H35033 Hypertensive retinopathy, bilateral: Secondary | ICD-10-CM | POA: Diagnosis not present

## 2020-12-12 DIAGNOSIS — I10 Essential (primary) hypertension: Secondary | ICD-10-CM

## 2020-12-13 LAB — MYOMARKER 3 PLUS PROFILE (RDL)

## 2020-12-13 NOTE — Telephone Encounter (Signed)
Expedited appeal sent to [employer] for Esbriet. Attached RELIEF trial with appeal letter and chart notes  Patient advised that we have submitted appeal and will provide her an update when we hear back from insurance  Fax: [phone] Phone: [phone]  Knox Saliva, PharmD, MPH Clinical Pharmacist (Rheumatology and Pulmonology)

## 2020-12-21 ENCOUNTER — Other Ambulatory Visit (HOSPITAL_COMMUNITY): Payer: Self-pay

## 2020-12-21 NOTE — Telephone Encounter (Signed)
Esbriet appeal was overturned. Approved from 12/05/20-08/03/21  Appeal number: [id number]  Member ID for future calls: [id number]  Test claim shows copay for 1st month supply is $2000. Will submit [employer] Esbriet PAP on Monday, 12/24/20  Knox Saliva, PharmD, MPH Clinical Pharmacist (Rheumatology and Pulmonology)

## 2020-12-25 NOTE — Telephone Encounter (Signed)
PAP forms faxed to [place]. Will update after receiving a determination.  Fax# [phone] Phone# [phone]

## 2021-01-02 ENCOUNTER — Other Ambulatory Visit: Payer: Self-pay

## 2021-01-02 ENCOUNTER — Emergency Department (HOSPITAL_COMMUNITY): Payer: Medicare Other

## 2021-01-02 ENCOUNTER — Encounter (HOSPITAL_COMMUNITY): Payer: Self-pay | Admitting: Family Medicine

## 2021-01-02 ENCOUNTER — Inpatient Hospital Stay (HOSPITAL_COMMUNITY)
Admission: EM | Admit: 2021-01-02 | Discharge: 2021-01-14 | DRG: 189 | Disposition: A | Discharge status: Home / Self Care | Payer: Medicare Other | Attending: Internal Medicine | Admitting: Internal Medicine

## 2021-01-02 DIAGNOSIS — I2510 Atherosclerotic heart disease of native coronary artery without angina pectoris: Secondary | ICD-10-CM | POA: Diagnosis present

## 2021-01-02 DIAGNOSIS — I272 Pulmonary hypertension, unspecified: Secondary | ICD-10-CM | POA: Diagnosis not present

## 2021-01-02 DIAGNOSIS — I2721 Secondary pulmonary arterial hypertension: Secondary | ICD-10-CM | POA: Diagnosis present

## 2021-01-02 DIAGNOSIS — G8929 Other chronic pain: Secondary | ICD-10-CM | POA: Diagnosis present

## 2021-01-02 DIAGNOSIS — J441 Chronic obstructive pulmonary disease with (acute) exacerbation: Secondary | ICD-10-CM

## 2021-01-02 DIAGNOSIS — R54 Age-related physical debility: Secondary | ICD-10-CM | POA: Diagnosis present

## 2021-01-02 DIAGNOSIS — J438 Other emphysema: Secondary | ICD-10-CM | POA: Diagnosis not present

## 2021-01-02 DIAGNOSIS — Z8701 Personal history of pneumonia (recurrent): Secondary | ICD-10-CM

## 2021-01-02 DIAGNOSIS — J9811 Atelectasis: Secondary | ICD-10-CM | POA: Diagnosis not present

## 2021-01-02 DIAGNOSIS — I110 Hypertensive heart disease with heart failure: Secondary | ICD-10-CM | POA: Diagnosis present

## 2021-01-02 DIAGNOSIS — Z79899 Other long term (current) drug therapy: Secondary | ICD-10-CM

## 2021-01-02 DIAGNOSIS — I517 Cardiomegaly: Secondary | ICD-10-CM | POA: Diagnosis present

## 2021-01-02 DIAGNOSIS — Z7982 Long term (current) use of aspirin: Secondary | ICD-10-CM

## 2021-01-02 DIAGNOSIS — D869 Sarcoidosis, unspecified: Secondary | ICD-10-CM | POA: Diagnosis present

## 2021-01-02 DIAGNOSIS — Z66 Do not resuscitate: Secondary | ICD-10-CM | POA: Diagnosis present

## 2021-01-02 DIAGNOSIS — R0902 Hypoxemia: Secondary | ICD-10-CM

## 2021-01-02 DIAGNOSIS — W07XXXA Fall from chair, initial encounter: Secondary | ICD-10-CM | POA: Diagnosis present

## 2021-01-02 DIAGNOSIS — R911 Solitary pulmonary nodule: Secondary | ICD-10-CM | POA: Diagnosis not present

## 2021-01-02 DIAGNOSIS — Z7951 Long term (current) use of inhaled steroids: Secondary | ICD-10-CM

## 2021-01-02 DIAGNOSIS — Z888 Allergy status to other drugs, medicaments and biological substances status: Secondary | ICD-10-CM

## 2021-01-02 DIAGNOSIS — M4854XA Collapsed vertebra, not elsewhere classified, thoracic region, initial encounter for fracture: Secondary | ICD-10-CM | POA: Diagnosis present

## 2021-01-02 DIAGNOSIS — I270 Primary pulmonary hypertension: Secondary | ICD-10-CM | POA: Diagnosis not present

## 2021-01-02 DIAGNOSIS — Z880 Allergy status to penicillin: Secondary | ICD-10-CM

## 2021-01-02 DIAGNOSIS — Y92009 Unspecified place in unspecified non-institutional (private) residence as the place of occurrence of the external cause: Secondary | ICD-10-CM

## 2021-01-02 DIAGNOSIS — J9621 Acute and chronic respiratory failure with hypoxia: Principal | ICD-10-CM | POA: Diagnosis present

## 2021-01-02 DIAGNOSIS — E890 Postprocedural hypothyroidism: Secondary | ICD-10-CM | POA: Diagnosis present

## 2021-01-02 DIAGNOSIS — I10 Essential (primary) hypertension: Secondary | ICD-10-CM | POA: Diagnosis not present

## 2021-01-02 DIAGNOSIS — Z8249 Family history of ischemic heart disease and other diseases of the circulatory system: Secondary | ICD-10-CM

## 2021-01-02 DIAGNOSIS — I5032 Chronic diastolic (congestive) heart failure: Secondary | ICD-10-CM

## 2021-01-02 DIAGNOSIS — R251 Tremor, unspecified: Secondary | ICD-10-CM | POA: Diagnosis present

## 2021-01-02 DIAGNOSIS — R52 Pain, unspecified: Secondary | ICD-10-CM | POA: Diagnosis not present

## 2021-01-02 DIAGNOSIS — M545 Low back pain, unspecified: Secondary | ICD-10-CM | POA: Diagnosis present

## 2021-01-02 DIAGNOSIS — R069 Unspecified abnormalities of breathing: Secondary | ICD-10-CM | POA: Diagnosis not present

## 2021-01-02 DIAGNOSIS — E785 Hyperlipidemia, unspecified: Secondary | ICD-10-CM | POA: Diagnosis present

## 2021-01-02 DIAGNOSIS — Z825 Family history of asthma and other chronic lower respiratory diseases: Secondary | ICD-10-CM

## 2021-01-02 DIAGNOSIS — J432 Centrilobular emphysema: Secondary | ICD-10-CM | POA: Diagnosis present

## 2021-01-02 DIAGNOSIS — Z981 Arthrodesis status: Secondary | ICD-10-CM | POA: Diagnosis not present

## 2021-01-02 DIAGNOSIS — Z79890 Hormone replacement therapy (postmenopausal): Secondary | ICD-10-CM | POA: Diagnosis not present

## 2021-01-02 DIAGNOSIS — Z20822 Contact with and (suspected) exposure to covid-19: Secondary | ICD-10-CM | POA: Diagnosis present

## 2021-01-02 DIAGNOSIS — Z9981 Dependence on supplemental oxygen: Secondary | ICD-10-CM | POA: Diagnosis not present

## 2021-01-02 DIAGNOSIS — Z7952 Long term (current) use of systemic steroids: Secondary | ICD-10-CM

## 2021-01-02 DIAGNOSIS — K219 Gastro-esophageal reflux disease without esophagitis: Secondary | ICD-10-CM | POA: Diagnosis present

## 2021-01-02 DIAGNOSIS — Z87891 Personal history of nicotine dependence: Secondary | ICD-10-CM

## 2021-01-02 DIAGNOSIS — E039 Hypothyroidism, unspecified: Secondary | ICD-10-CM | POA: Diagnosis present

## 2021-01-02 DIAGNOSIS — R0689 Other abnormalities of breathing: Secondary | ICD-10-CM | POA: Diagnosis not present

## 2021-01-02 DIAGNOSIS — R0602 Shortness of breath: Secondary | ICD-10-CM | POA: Diagnosis not present

## 2021-01-02 DIAGNOSIS — Z743 Need for continuous supervision: Secondary | ICD-10-CM | POA: Diagnosis not present

## 2021-01-02 DIAGNOSIS — J8410 Pulmonary fibrosis, unspecified: Secondary | ICD-10-CM | POA: Diagnosis present

## 2021-01-02 DIAGNOSIS — R9431 Abnormal electrocardiogram [ECG] [EKG]: Secondary | ICD-10-CM

## 2021-01-02 DIAGNOSIS — M546 Pain in thoracic spine: Secondary | ICD-10-CM | POA: Diagnosis not present

## 2021-01-02 DIAGNOSIS — Z83438 Family history of other disorder of lipoprotein metabolism and other lipidemia: Secondary | ICD-10-CM

## 2021-01-02 LAB — COMPREHENSIVE METABOLIC PANEL
ALT: 20 U/L (ref 0–44)
AST: 30 U/L (ref 15–41)
Albumin: 3.9 g/dL (ref 3.5–5.0)
Alkaline Phosphatase: 42 U/L (ref 38–126)
Anion gap: 12 (ref 5–15)
BUN: 20 mg/dL (ref 8–23)
CO2: 26 mmol/L (ref 22–32)
Calcium: 9.3 mg/dL (ref 8.9–10.3)
Chloride: 98 mmol/L (ref 98–111)
Creatinine, Ser: 1.18 mg/dL — ABNORMAL HIGH (ref 0.44–1.00)
GFR, Estimated: 48 mL/min — ABNORMAL LOW (ref >60)
Glucose, Bld: 147 mg/dL — ABNORMAL HIGH (ref 70–99)
Potassium: 4.2 mmol/L (ref 3.5–5.1)
Sodium: 136 mmol/L (ref 135–145)
Total Bilirubin: 1.6 mg/dL — ABNORMAL HIGH (ref 0.3–1.2)
Total Protein: 7.2 g/dL (ref 6.5–8.1)

## 2021-01-02 LAB — URINALYSIS, ROUTINE W REFLEX MICROSCOPIC
Bacteria, UA: NONE SEEN
Bilirubin Urine: NEGATIVE
Glucose, UA: NEGATIVE mg/dL
Ketones, ur: NEGATIVE mg/dL
Leukocytes,Ua: NEGATIVE
Nitrite: NEGATIVE
Protein, ur: NEGATIVE mg/dL
Specific Gravity, Urine: 1.014 (ref 1.005–1.030)
pH: 5 (ref 5.0–8.0)

## 2021-01-02 LAB — CBC WITH DIFFERENTIAL/PLATELET
Abs Immature Granulocytes: 0.15 10*3/uL — ABNORMAL HIGH (ref 0.00–0.07)
Basophils Absolute: 0.1 10*3/uL (ref 0.0–0.1)
Basophils Relative: 1 %
Eosinophils Absolute: 0 10*3/uL (ref 0.0–0.5)
Eosinophils Relative: 0 %
HCT: 44.1 % (ref 36.0–46.0)
Hemoglobin: 14.7 g/dL (ref 12.0–15.0)
Immature Granulocytes: 1 %
Lymphocytes Relative: 8 %
Lymphs Abs: 1 10*3/uL (ref 0.7–4.0)
MCH: 30.3 pg (ref 26.0–34.0)
MCHC: 33.3 g/dL (ref 30.0–36.0)
MCV: 90.9 fL (ref 80.0–100.0)
Monocytes Absolute: 1 10*3/uL (ref 0.1–1.0)
Monocytes Relative: 7 %
Neutro Abs: 10.9 10*3/uL — ABNORMAL HIGH (ref 1.7–7.7)
Neutrophils Relative %: 83 %
Platelets: 180 10*3/uL (ref 150–400)
RBC: 4.85 MIL/uL (ref 3.87–5.11)
RDW: 15.7 % — ABNORMAL HIGH (ref 11.5–15.5)
WBC: 13.2 10*3/uL — ABNORMAL HIGH (ref 4.0–10.5)
nRBC: 0 % (ref 0.0–0.2)

## 2021-01-02 LAB — RESP PANEL BY RT-PCR (FLU A&B, COVID) ARPGX2
Influenza A by PCR: NEGATIVE
Influenza B by PCR: NEGATIVE
SARS Coronavirus 2 by RT PCR: NEGATIVE

## 2021-01-02 LAB — TROPONIN I (HIGH SENSITIVITY)
Troponin I (High Sensitivity): 69 ng/L — ABNORMAL HIGH (ref <18)
Troponin I (High Sensitivity): 69 ng/L — ABNORMAL HIGH (ref <18)

## 2021-01-02 LAB — BRAIN NATRIURETIC PEPTIDE: B Natriuretic Peptide: 452.8 pg/mL — ABNORMAL HIGH (ref 0.0–100.0)

## 2021-01-02 LAB — D-DIMER, QUANTITATIVE: D-Dimer, Quant: 1.32 ug/mL-FEU — ABNORMAL HIGH (ref 0.00–0.50)

## 2021-01-02 MED ORDER — ACETAMINOPHEN 325 MG PO TABS
650.0000 mg | ORAL_TABLET | Freq: Four times a day (QID) | ORAL | Status: DC | PRN
  Administered 2021-01-04 – 2021-01-13 (×8): 650 mg via ORAL
  Filled 2021-01-02 (×8): qty 2

## 2021-01-02 MED ORDER — ATENOLOL 25 MG PO TABS
25.0000 mg | ORAL_TABLET | Freq: Every day | ORAL | Status: DC
  Administered 2021-01-03 – 2021-01-13 (×12): 25 mg via ORAL
  Filled 2021-01-02 (×12): qty 1

## 2021-01-02 MED ORDER — HYDROCHLOROTHIAZIDE 12.5 MG PO CAPS: 12.5000 mg | ORAL_CAPSULE | Freq: Every day | ORAL | Status: DC

## 2021-01-02 MED ORDER — TADALAFIL (PAH) 20 MG PO TABS
40.0000 mg | ORAL_TABLET | Freq: Every day | ORAL | Status: DC
  Administered 2021-01-03 – 2021-01-14 (×12): 40 mg via ORAL
  Filled 2021-01-02 (×14): qty 2

## 2021-01-02 MED ORDER — POTASSIUM CHLORIDE CRYS ER 10 MEQ PO TBCR
10.0000 meq | EXTENDED_RELEASE_TABLET | Freq: Every day | ORAL | Status: DC
  Filled 2021-01-02: qty 1

## 2021-01-02 MED ORDER — FUROSEMIDE 20 MG PO TABS: 20.0000 mg | ORAL_TABLET | Freq: Every day | ORAL | Status: DC

## 2021-01-02 MED ORDER — ACETAMINOPHEN 650 MG RE SUPP: 650.0000 mg | Freq: Four times a day (QID) | RECTAL | Status: DC | PRN

## 2021-01-02 MED ORDER — ASPIRIN EC 81 MG PO TBEC
81.0000 mg | DELAYED_RELEASE_TABLET | Freq: Every day | ORAL | Status: DC
  Administered 2021-01-03 – 2021-01-13 (×12): 81 mg via ORAL
  Filled 2021-01-02 (×12): qty 1

## 2021-01-02 MED ORDER — COENZYME Q10 200 MG PO CAPS: 200.0000 mg | ORAL_CAPSULE | Freq: Every day | ORAL | Status: DC

## 2021-01-02 MED ORDER — IPRATROPIUM-ALBUTEROL 0.5-2.5 (3) MG/3ML IN SOLN: 3.0000 mL | RESPIRATORY_TRACT | Status: DC | PRN

## 2021-01-02 MED ORDER — ENOXAPARIN SODIUM 40 MG/0.4ML IJ SOSY
40.0000 mg | PREFILLED_SYRINGE | Freq: Every day | INTRAMUSCULAR | Status: DC
  Administered 2021-01-03 – 2021-01-14 (×11): 40 mg via SUBCUTANEOUS
  Filled 2021-01-02 (×11): qty 0.4

## 2021-01-02 MED ORDER — FLUTICASONE-UMECLIDIN-VILANT 100-62.5-25 MCG/INH IN AEPB: 1.0000 | INHALATION_SPRAY | Freq: Every day | RESPIRATORY_TRACT | Status: DC

## 2021-01-02 MED ORDER — PANTOPRAZOLE SODIUM 40 MG PO TBEC
40.0000 mg | DELAYED_RELEASE_TABLET | Freq: Every day | ORAL | Status: DC
  Administered 2021-01-03 – 2021-01-14 (×12): 40 mg via ORAL
  Filled 2021-01-02 (×12): qty 1

## 2021-01-02 MED ORDER — TRAMADOL HCL 50 MG PO TABS
50.0000 mg | ORAL_TABLET | Freq: Three times a day (TID) | ORAL | Status: DC | PRN
Start: 2021-01-02 — End: 2021-01-14
  Administered 2021-01-03 – 2021-01-14 (×14): 50 mg via ORAL
  Filled 2021-01-02 (×15): qty 1

## 2021-01-02 MED ORDER — IPRATROPIUM-ALBUTEROL 0.5-2.5 (3) MG/3ML IN SOLN
3.0000 mL | Freq: Once | RESPIRATORY_TRACT | Status: AC
  Administered 2021-01-02: 3 mL via RESPIRATORY_TRACT
  Filled 2021-01-02: qty 3

## 2021-01-02 MED ORDER — SODIUM CHLORIDE 0.9 % IV SOLN: INTRAVENOUS | Status: DC

## 2021-01-02 MED ORDER — SENNOSIDES-DOCUSATE SODIUM 8.6-50 MG PO TABS: 1.0000 | ORAL_TABLET | Freq: Every evening | ORAL | Status: DC | PRN

## 2021-01-02 MED ORDER — LEVOTHYROXINE SODIUM 112 MCG PO TABS
112.0000 ug | ORAL_TABLET | Freq: Every day | ORAL | Status: DC
  Administered 2021-01-03 – 2021-01-14 (×12): 112 ug via ORAL
  Filled 2021-01-02 (×12): qty 1

## 2021-01-02 MED ORDER — ROSUVASTATIN CALCIUM 20 MG PO TABS
20.0000 mg | ORAL_TABLET | Freq: Every day | ORAL | Status: DC
  Administered 2021-01-03 – 2021-01-14 (×12): 20 mg via ORAL
  Filled 2021-01-02 (×12): qty 1

## 2021-01-02 MED ORDER — METHYLPREDNISOLONE SODIUM SUCC 125 MG IJ SOLR
125.0000 mg | Freq: Three times a day (TID) | INTRAMUSCULAR | Status: DC
  Administered 2021-01-03: 125 mg via INTRAVENOUS
  Filled 2021-01-02: qty 2

## 2021-01-02 MED ORDER — ATENOLOL 25 MG PO TABS
50.0000 mg | ORAL_TABLET | Freq: Every day | ORAL | Status: DC
  Administered 2021-01-03 – 2021-01-13 (×8): 50 mg via ORAL
  Filled 2021-01-02 (×13): qty 2

## 2021-01-02 MED ORDER — METHYLPREDNISOLONE SODIUM SUCC 125 MG IJ SOLR
125.0000 mg | Freq: Once | INTRAMUSCULAR | Status: AC
  Administered 2021-01-02: 125 mg via INTRAVENOUS
  Filled 2021-01-02: qty 2

## 2021-01-02 NOTE — ED Notes (Signed)
Attempted to wean patient down to home dose of O2. Unsuccessful, patient desatted to low 80s. Placed patient back on 15 L NRB with improvement to 96%.

## 2021-01-02 NOTE — ED Notes (Signed)
Unsuccessful attempt to give report wants to be called back in  10 minutes

## 2021-01-02 NOTE — ED Notes (Signed)
Report given to carolinern on 2w

## 2021-01-02 NOTE — ED Provider Notes (Signed)
[facility] EMERGENCY DEPARTMENT Provider Note   CSN: [id number] Arrival date & time: 01/02/21  1444     History Chief Complaint  Patient presents with  . Shortness of Breath    Kimberly Irwin is a 75 y.o. female.  HPI   75 year old female past medical history of sarcoidosis, HTN, HLD, COPD with pulmonary hypertension, chronic respiratory failure on home oxygen presents the emergency department with reported shortness of breath and productive cough.  Patient states she has been ill for the past week.  She has had a cough that is intermittently productive of white/yellow phlegm.  She is had chills but no documented fever.  Currently denies any active chest pain.  Denies any nausea/vomiting/diarrhea.  She states currently her legs are not swollen.  She has been compliant with her medications.  Past Medical History:  Diagnosis Date  . Bursitis of left hip 10/26/2012  . Chronic low back pain   . Chronic respiratory failure with hypoxia ([place]) 10/21/2016  . Coronary artery calcification 07/03/2016  . DDD (degenerative disc disease)    L3-4 with facet arthropathy and stenosis  . Degenerative spondylolisthesis    L4-5 grade 1 with stenosis  . Emphysema lung ([place])   . GERD (gastroesophageal reflux disease)   . Hiatal hernia   . Hyperlipidemia   . Hypertension   . Hypothyroidism 10/28/2015  . Pain in joint, lower leg 10/26/2012   left   . Pneumonia    as a child several times.   . Pulmonary hypertension ([place])   . Sarcoidosis   . Tremors of nervous system     Patient Active Problem List   Diagnosis Date Noted  . Diarrhea 09/15/2018  . Pulmonary HTN ([place]) 01/28/2018  . Acute on chronic respiratory failure with hypoxia ([place]) 01/28/2018  . Epistaxis 12/28/2017  . Hypoxia, sleep related 10/21/2016  . Cardiomegaly 07/13/2016  . Coronary artery calcification 07/03/2016  . COPD (chronic obstructive pulmonary disease) ([place]) 07/01/2016  . Hypersomnia 07/01/2016  .  Chronic respiratory failure with hypoxia ([place]) 05/20/2016  . Dermatitis 04/14/2016  . SOB (shortness of breath) 04/03/2016  . Hypothyroidism 10/28/2015  . Lumbar compression fracture ([place]) 09/14/2014  . Medicare annual wellness visit, subsequent 04/27/2014  . Obesity   . S/P left THA, AA 06/28/2013  . Decreased visual acuity 02/19/2013  . Hyperglycemia 04/04/2011  . Chronic pain syndrome 01/20/2011  . Depression 08/10/2008  . GERD 04/26/2008  . Sinusitis 08/02/2007  . Lumbar disc disease 05/31/2007  . 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 Hyperlipidemia 05/29/2007  . Tremor   . Essential hypertension     Past Surgical History:  Procedure Laterality Date  . ANTERIOR LAT LUMBAR FUSION Left 08/29/2014   Procedure: EXTREME LEFT LATERAL INTERBODY FUSION LUMBAR TWO-THREE LATERAL PLATE;  Surgeon: Charlie Pitter, MD;  Location: [facility];  Service: Neurosurgery;  Laterality: Left;  . BACK SURGERY  01/15/09   L3-4 and L4-5 decompressive laminectomy with bilateral L3, L4, and L5 decompressive foraminotomies, more than it would be required for simple interbody fusion alone.  Marland Kitchen BACK SURGERY  01/15/09   L3-4 and L4-5 posterior lumbar interbody fusion utilizing tanget interbody allograft wedge, Telamon interbody PEEk cage, and local autografting.  Marland Kitchen BACK SURGERY  01/15/09   L3, L4, and L5 posterolateral arthrodesis using segmental pedicle screw fixation and local autografting.  Marland Kitchen CARDIAC CATHETERIZATION    . CARPAL TUNNEL RELEASE Bilateral   . COLONOSCOPY    . ESOPHAGOGASTRODUODENOSCOPY    . EYE  SURGERY     lens implant  . JOINT REPLACEMENT  09/13/09   Right Hip, Dr. Alvan Dame  . KYPHOPLASTY N/A 09/14/2014   Procedure: Lumbar Two Kyphoplasty/Vertebroplasty;  Surgeon: Charlie Pitter, MD;  Location: [facility];  Service: Neurosurgery;  Laterality: N/A;  Lumbar Two Kyphoplasty/Vertebroplasty  . RIGHT HEART CATH N/A 11/19/2017   Procedure: RIGHT HEART CATH;  Surgeon: Larey Dresser, MD;  Location: [facility];  Service: Cardiovascular;  Laterality: N/A;  . RIGHT HEART CATH N/A 05/27/2019   Procedure: RIGHT HEART CATH;  Surgeon: Larey Dresser, MD;  Location: [facility];  Service: Cardiovascular;  Laterality: N/A;  . THYROIDECTOMY    . TOTAL HIP ARTHROPLASTY Left 06/28/2013   Procedure: LEFT TOTAL  HIP ARTHROPLASTY ANTERIOR APPROACH;  Surgeon: Mauri Pole, MD;  Location: [facility];  Service: Orthopedics;  Laterality: Left;     OB History   No obstetric history on file.     Family History  Problem Relation Age of Onset  . Cancer Mother 60       Breast  . Heart disease Mother   . Hypertension Mother   . Hyperlipidemia Mother   . Heart attack Mother   . Asthma Maternal Grandmother     Social History   Tobacco Use  . Smoking status: Former Smoker    Packs/day: 0.50    Years: 32.00    Pack years: 16.00    Types: Cigarettes    Quit date: 07/22/1994    Years since quitting: 26.4  . Smokeless tobacco: Never Used  Substance Use Topics  . Alcohol use: No    Alcohol/week: 0.0 standard drinks  . Drug use: No    Home Medications Prior to Admission medications   Medication Sig Start Date End Date Taking? Authorizing Provider  acetaminophen (TYLENOL) 500 MG tablet Take 1,000 mg by mouth every 6 (six) hours as needed for moderate pain.    [provider]  aspirin EC 81 MG tablet Take 81 mg by mouth at bedtime.     [provider]  atenolol (TENORMIN) 25 MG tablet TAKE 1 TABLET (25 MG TOTAL) BY MOUTH AT BEDTIME. TAKE THE 50 MG ATENOLOL IN AM AND THE 25 MG IN PM 08/15/20   Mosie Lukes, MD  atenolol (TENORMIN) 50 MG tablet TAKE 1 TABLET BY MOUTH EVERY DAY 08/15/20   Mosie Lukes, MD  BESIVANCE 0.6 % SUSP Place 1 drop into the left eye See admin instructions. Only use : 1 DROP INTO LEFT 4 TIMES DAILY x 2 DAYS AFTER EYE INJECTION 12/18/14   [provider]  calcium carbonate (OSCAL) 1500 (600 Ca) MG TABS tablet Take 1,500 mg by mouth  daily with breakfast.     [provider]  Cholecalciferol (VITAMIN D) 2000 UNITS tablet Take 2,000 Units by mouth every evening.     [provider]  Cinnamon 500 MG capsule Take 2,000 mg by mouth daily.    [provider]  citalopram (CELEXA) 10 MG tablet Take 1 tablet (10 mg total) by mouth daily. 09/14/20   Saguier, Percell Miller, PA-C  Coenzyme Q10 200 MG capsule Take 200 mg by mouth at bedtime.     [provider]  furosemide (LASIX) 20 MG tablet Take by mouth. 04/02/20   [provider]  gabapentin (NEURONTIN) 300 MG capsule Take 1 capsule (300 mg total) by mouth at bedtime. 09/14/20   Saguier, Percell Miller, PA-C  Glucosamine HCl (GLUCOSAMINE PO) Take  1 tablet by mouth every evening.     [provider]  hydrochlorothiazide (MICROZIDE) 12.5 MG capsule TAKE 1 CAPSULE BY MOUTH EVERY DAY 02/07/20   Larey Dresser, MD  ketoconazole (NIZORAL) 2 % cream Apply 1 application topically daily as needed for irritation (face).     [provider]  levocetirizine (XYZAL) 5 MG tablet TAKE 1 TABLET BY MOUTH EVERY DAY IN THE EVENING 12/06/20   Saguier, Percell Miller, PA-C  levothyroxine (SYNTHROID) 112 MCG tablet Take 1 tablet (112 mcg total) by mouth daily before breakfast. 02/16/20   Copland, Gay Filler, MD  Omega-3 Fatty Acids (FISH OIL) 1200 MG CAPS Take 1,200 mg by mouth daily.    [provider]  omeprazole (PRILOSEC) 40 MG capsule TAKE 1 CAPSULE BY MOUTH EVERY DAY 02/15/20   Copland, Gay Filler, MD  potassium chloride (KLOR-CON) 10 MEQ tablet Take by mouth. 04/02/20   [provider]  predniSONE (DELTASONE) 5 MG tablet TAKE 1 TABLET BY MOUTH EVERY DAY WITH BREAKFAST 11/06/20   Mannam, Praveen, MD  rosuvastatin (CRESTOR) 20 MG tablet TAKE 1 TABLET BY MOUTH EVERY DAY 05/15/20   Mosie Lukes, MD  Selexipag (UPTRAVI) 1200 MCG TABS Take 2,400 mcg by mouth 2 (two) times daily. Needs appt for further refills 10/23/20   Larey Dresser, MD   Spacer/Aero-Holding Chambers (AEROCHAMBER PLUS WITH MASK) inhaler Use as instructed 01/28/18   Debbe Odea, MD  tadalafil, PAH, (ADCIRCA) 20 MG tablet TAKE 2 TABLETS BY MOUTH EVERY DAY 10/16/20   Larey Dresser, MD  traMADol (ULTRAM) 50 MG tablet Take 2 tablets (100 mg total) by mouth every 6 (six) hours as needed for moderate pain or severe pain. 06/17/18   Juanito Doom, MD  TRELEGY ELLIPTA 100-62.5-25 MCG/INH AEPB Inhale 1 puff into the lungs daily. 05/17/19   [provider]  vitamin B-12 (CYANOCOBALAMIN) 1000 MCG tablet Take 1,000 mcg by mouth daily.    [provider]    Allergies    Lipitor [atorvastatin], Penicillins, Metronidazole, Pregabalin, and Simvastatin  Review of Systems   Review of Systems  Constitutional: Positive for fatigue. Negative for chills and fever.  HENT: Negative for congestion.   Eyes: Negative for visual disturbance.  Respiratory: Positive for cough and shortness of breath.   Cardiovascular: Negative for chest pain, palpitations and leg swelling.  Gastrointestinal: Negative for abdominal pain, diarrhea and vomiting.  Genitourinary: Negative for dysuria.  Musculoskeletal: Negative for neck pain.  Skin: Negative for rash.  Neurological: Negative for headaches.    Physical Exam Updated Vital Signs BP 117/79   Pulse 66   Temp 97.9 F (36.6 C) (Oral)   Resp (!) 21   Ht _0  (1.626 m)   Wt 74.4 kg   SpO2 94%   BMI 28.15 kg/m   Physical Exam Vitals and nursing note reviewed.  Constitutional:      Appearance: Normal appearance.  HENT:     Head: Normocephalic.     Mouth/Throat:     Mouth: Mucous membranes are moist.  Cardiovascular:     Rate and Rhythm: Normal rate.  Pulmonary:     Effort: Tachypnea present.     Breath sounds: Examination of the right-lower field reveals decreased breath sounds. Examination of the left-lower field reveals decreased breath sounds. Decreased breath sounds and wheezing present.   Abdominal:     Palpations: Abdomen is soft.     Tenderness: There is no abdominal tenderness.  Musculoskeletal:     Right lower  leg: No edema.     Left lower leg: No edema.  Skin:    General: Skin is warm.  Neurological:     Mental Status: She is alert and oriented to person, place, and time. Mental status is at baseline.  Psychiatric:        Mood and Affect: Mood normal.     ED Results / Procedures / Treatments   Labs (all labs ordered are listed, but only abnormal results are displayed) Labs Reviewed  COMPREHENSIVE METABOLIC PANEL - Abnormal; Notable for the following components:      Result Value   Glucose, Bld 147 (*)    Creatinine, Ser 1.18 (*)    Total Bilirubin 1.6 (*)    GFR, Estimated 48 (*)    All other components within normal limits  CBC WITH DIFFERENTIAL/PLATELET - Abnormal; Notable for the following components:   WBC 13.2 (*)    RDW 15.7 (*)    Neutro Abs 10.9 (*)    Abs Immature Granulocytes 0.15 (*)    All other components within normal limits  RESP PANEL BY RT-PCR (FLU A&B, COVID) ARPGX2  BRAIN NATRIURETIC PEPTIDE  URINALYSIS, ROUTINE W REFLEX MICROSCOPIC    EKG None  Radiology DG Chest Port 1 View  Result Date: 01/02/2021 CLINICAL DATA:  Shortness of breath. EXAM: PORTABLE CHEST 1 VIEW COMPARISON:  January 27, 2018 chest radiograph; chest CT October 08, 2020 FINDINGS: There is chronic interstitial thickening in the lung bases. Lungs otherwise are clear and unchanged. Heart is mildly enlarged with prominence of the main pulmonary arteries, a stable finding likely indicative of a degree of pulmonary arterial hypertension. No evident adenopathy. No bone lesions. IMPRESSION: There is cardiomegaly with apparent pulmonary arterial hypertension. Suspect a degree of underlying chronic bronchitis. No edema or consolidation. Electronically Signed   By: Lowella Grip III M.D.   On: 01/02/2021 15:15    Procedures Procedures   Medications Ordered in  ED Medications  methylPREDNISolone sodium succinate (SOLU-MEDROL) 125 mg/2 mL injection 125 mg (has no administration in time range)    ED Course  I have reviewed the triage vital signs and the nursing notes.  Pertinent labs & imaging results that were available during my care of the patient were reviewed by me and considered in my medical decision making (see chart for details).    MDM Rules/Calculators/A&P                          75 year old female presents the emergency department with shortness of breath and productive cough.  Normally wears 8 L nasal cannula at home, was found to be hypoxic, arrives on a nonrebreather.  Scattered wheezes and rales on lung auscultation.  Denies any active chest pain.  No reported fever.  Chest x-ray shows stable cardiomegaly with possible bronchitis.  Flu and COVID is negative.  Blood work has a leukocytosis of 13 but otherwise appears baseline.  Cardiac work-up is pending but EKG is unchanged.  No active chest pain.  Patient's wheezes/rales has improved after steroids and breathing treatment, she is been transitioned to high flow nasal cannula and appears comfortable.  She will be admitted for increased oxygen requirement for further work-up, D-dimer has been ordered to be completed.  Patients evaluation and results requires admission for further treatment and care. Patient agrees with admission plan, offers no new complaints and is stable/unchanged at time of admit.  Final Clinical Impression(s) / ED Diagnoses Final diagnoses:  None  Rx / DC Orders ED Discharge Orders    None       Lorelle Gibbs, DO 01/02/21 2024

## 2021-01-02 NOTE — ED Notes (Signed)
Admitting doctor has seen the pt

## 2021-01-02 NOTE — Telephone Encounter (Signed)
Called [employer] to check status of [facility] PAP application. Rep advised that patient will be approved 01/02/21 until further notice. They will reach out to patient to complete welcome call and will fax office approval letter.  Phone# [phone]  Medication will ship from [facility].  Phone# [phone]

## 2021-01-02 NOTE — H&P (Signed)
History and Physical    Kimberly Irwin CBJ:[id number] DOB: 06/22/1946 DOA: 01/02/2021  PCP: Mosie Lukes, MD   Patient coming from: Home  Chief Complaint: SOB, cough  HPI: Kimberly Irwin is a 75 y.o. female with medical history significant for COPD, sarcoidosis, HTN, HLD, HFpEF, cardiomegaly, chronic respiratory failure with hypoxia on oxygen at 7-8 L/min by [place] at home who presents for evaluation of worsening SOB. She presented by EMS on NRB and was started on Bipap in the ER. She has now been weaned to [facility] which she is tolerating.  Reports over the last week she has had progressively worsening shortness of breath.  She reports having a cough that is occasionally productive of a white phlegm.  She reports she has a chronic cough but she feels it is increased in intensity in the last few days.  She states she has not had any fever but has had intermittent chills.  She denies any chest pain, pressure or palpitations.  She reports has been compliant with her medications.  She has not had any change in diet and no new exposures to chemicals, plants or pets at home.  Her daughter lives with her. She is on prednisone every morning for her sarcoidosis.  She reports that she has been more tired and fatigued but has had a normal appetite.  ED Course: Kimberly Irwin has been hemodynamically stable in the emergency room.  They did WBC of 13,200.  IMA globin is 14.7 hematocrit 44.1 with platelet count of 180,000.  BNP is mildly elevated at 452.8, initial troponin is mild elevated at 69.  Sodium is 136, potassium 4.2, chloride 98, bicarb 26, glucose 147, BUN 20, creatinine 1.18, bilirubin 1.6, normal LFTs.  D-dimer is pending.  Serial troponins have been ordered.  Patient is continued to need increased oxygen over her home level.  Hospitalist service asked to admit for further management.  Patient's last echocardiogram was in September 2020 which showed an EF of 60 to 65% with LVH and abnormal relaxation of the left  ventricle.   Review of Systems:  General: Reports fatigue and weakness. Denies fever, chills, weight loss, night sweats.  Denies dizziness.  Denies change in appetite HENT: Denies head trauma, headache, denies change in hearing, tinnitus.  Denies nasal congestion or bleeding. Denies sore throat, sores in mouth. Denies difficulty swallowing Eyes: Denies blurry vision, pain in eye, drainage.  Denies discoloration of eyes. Neck: Denies pain.  Denies swelling.  Denies pain with movement. Cardiovascular: Denies chest pain, palpitations.  Denies edema.  Denies orthopnea Respiratory: Reports shortness of breath with chronic cough. Reports wheezing. Reports mild sputum production Gastrointestinal: Denies abdominal pain, swelling. Denies nausea, vomiting, diarrhea. Denies melena.  Denies hematemesis. Musculoskeletal: Denies limitation of movement. Denies deformity or swelling.  Denies pain.   Genitourinary: Denies pelvic pain. Denies urinary frequency or hesitancy. Denies dysuria.  Skin: Denies rash.  Denies petechiae, purpura, ecchymosis. Neurological: Denies headache. Denies syncope. Denies seizure activity. Denies paresthesia. Denies slurred speech, drooping face. Denies visual change. Psychiatric: Denies depression, anxiety.  Denies hallucinations.  Past Medical History:  Diagnosis Date  . Bursitis of left hip 10/26/2012  . Chronic low back pain   . Chronic respiratory failure with hypoxia ([place]) 10/21/2016  . Coronary artery calcification 07/03/2016  . DDD (degenerative disc disease)    L3-4 with facet arthropathy and stenosis  . Degenerative spondylolisthesis    L4-5 grade 1 with stenosis  . Emphysema lung ([place])   . GERD (gastroesophageal reflux  disease)   . Hiatal hernia   . Hyperlipidemia   . Hypertension   . Hypothyroidism 10/28/2015  . Pain in joint, lower leg 10/26/2012   left   . Pneumonia    as a child several times.   . Pulmonary hypertension ([place])   . Sarcoidosis   . Tremors  of nervous system     Past Surgical History:  Procedure Laterality Date  . ANTERIOR LAT LUMBAR FUSION Left 08/29/2014   Procedure: EXTREME LEFT LATERAL INTERBODY FUSION LUMBAR TWO-THREE LATERAL PLATE;  Surgeon: Charlie Pitter, MD;  Location: [place] [facility];  Service: Neurosurgery;  Laterality: Left;  . BACK SURGERY  01/15/09   L3-4 and L4-5 decompressive laminectomy with bilateral L3, L4, and L5 decompressive foraminotomies, more than it would be required for simple interbody fusion alone.  Marland Kitchen BACK SURGERY  01/15/09   L3-4 and L4-5 posterior lumbar interbody fusion utilizing tanget interbody allograft wedge, [place] interbody PEEk cage, and local autografting.  Marland Kitchen BACK SURGERY  01/15/09   L3, L4, and L5 posterolateral arthrodesis using segmental pedicle screw fixation and local autografting.  Marland Kitchen CARDIAC CATHETERIZATION    . CARPAL TUNNEL RELEASE Bilateral   . COLONOSCOPY    . ESOPHAGOGASTRODUODENOSCOPY    . EYE SURGERY     lens implant  . JOINT REPLACEMENT  09/13/09   Right Hip, Dr. Alvan Dame  . KYPHOPLASTY N/A 09/14/2014   Procedure: Lumbar Two Kyphoplasty/Vertebroplasty;  Surgeon: Charlie Pitter, MD;  Location: [place] [facility] ORS;  Service: Neurosurgery;  Laterality: N/A;  Lumbar Two Kyphoplasty/Vertebroplasty  . RIGHT HEART CATH N/A 11/19/2017   Procedure: RIGHT HEART CATH;  Surgeon: Larey Dresser, MD;  Location: [place] [facility] LAB;  Service: Cardiovascular;  Laterality: N/A;  . RIGHT HEART CATH N/A 05/27/2019   Procedure: RIGHT HEART CATH;  Surgeon: Larey Dresser, MD;  Location: [place] [facility] LAB;  Service: Cardiovascular;  Laterality: N/A;  . THYROIDECTOMY    . TOTAL HIP ARTHROPLASTY Left 06/28/2013   Procedure: LEFT TOTAL  HIP ARTHROPLASTY ANTERIOR APPROACH;  Surgeon: Mauri Pole, MD;  Location: [facility];  Service: Orthopedics;  Laterality: Left;    Social History  reports that she quit smoking about 26 years ago. Her smoking use included cigarettes. She has a 16.00 pack-year smoking history. She  has never used smokeless tobacco. She reports that she does not drink alcohol and does not use drugs.  Allergies  Allergen Reactions  . Lipitor [Atorvastatin] Swelling  . Penicillins Swelling    Has patient had a PCN reaction causing immediate rash, facial/tongue/throat swelling, SOB or lightheadedness with hypotension: Yes Has patient had a PCN reaction causing severe rash involving mucus membranes or skin necrosis: No Has patient had a PCN reaction that required hospitalization: No Has patient had a PCN reaction occurring within the last 10 years: No If all of the above answers are "NO", then may proceed with Cephalosporin use.   . Metronidazole Swelling  . Pregabalin Other (See Comments)    REACTION: Somnolence and dizziness  . Simvastatin Other (See Comments)    Leg pain    Family History  Problem Relation Age of Onset  . Cancer Mother 74       Breast  . Heart disease Mother   . Hypertension Mother   . Hyperlipidemia Mother   . Heart attack Mother   . Asthma Maternal Grandmother      Prior to Admission medications   Medication Sig Start Date End Date Taking? Authorizing Provider  acetaminophen (TYLENOL) 500 MG tablet Take 1,000 mg by mouth every 6 (six) hours as needed for moderate pain.    [provider]  aspirin EC 81 MG tablet Take 81 mg by mouth at bedtime.     [provider]  atenolol (TENORMIN) 25 MG tablet TAKE 1 TABLET (25 MG TOTAL) BY MOUTH AT BEDTIME. TAKE THE 50 MG ATENOLOL IN AM AND THE 25 MG IN PM 08/15/20   Mosie Lukes, MD  atenolol (TENORMIN) 50 MG tablet TAKE 1 TABLET BY MOUTH EVERY DAY 08/15/20   Mosie Lukes, MD  BESIVANCE 0.6 % SUSP Place 1 drop into the left eye See admin instructions. Only use : 1 DROP INTO LEFT 4 TIMES DAILY x 2 DAYS AFTER EYE INJECTION 12/18/14   [provider]  calcium carbonate (OSCAL) 1500 (600 Ca) MG TABS tablet Take 1,500 mg by mouth daily with breakfast.     [provider]   Cholecalciferol (VITAMIN D) 2000 UNITS tablet Take 2,000 Units by mouth every evening.     [provider]  Cinnamon 500 MG capsule Take 2,000 mg by mouth daily.    [provider]  citalopram (CELEXA) 10 MG tablet Take 1 tablet (10 mg total) by mouth daily. 09/14/20   Saguier, Percell Miller, PA-C  Coenzyme Q10 200 MG capsule Take 200 mg by mouth at bedtime.     [provider]  furosemide (LASIX) 20 MG tablet Take by mouth. 04/02/20   [provider]  gabapentin (NEURONTIN) 300 MG capsule Take 1 capsule (300 mg total) by mouth at bedtime. 09/14/20   Saguier, Percell Miller, PA-C  Glucosamine HCl (GLUCOSAMINE PO) Take 1 tablet by mouth every evening.     [provider]  hydrochlorothiazide (MICROZIDE) 12.5 MG capsule TAKE 1 CAPSULE BY MOUTH EVERY DAY 02/07/20   Larey Dresser, MD  ketoconazole (NIZORAL) 2 % cream Apply 1 application topically daily as needed for irritation (face).     [provider]  levocetirizine (XYZAL) 5 MG tablet TAKE 1 TABLET BY MOUTH EVERY DAY IN THE EVENING 12/06/20   Saguier, Percell Miller, PA-C  levothyroxine (SYNTHROID) 112 MCG tablet Take 1 tablet (112 mcg total) by mouth daily before breakfast. 02/16/20   Copland, Gay Filler, MD  Omega-3 Fatty Acids (FISH OIL) 1200 MG CAPS Take 1,200 mg by mouth daily.    [provider]  omeprazole (PRILOSEC) 40 MG capsule TAKE 1 CAPSULE BY MOUTH EVERY DAY 02/15/20   Copland, Gay Filler, MD  potassium chloride (KLOR-CON) 10 MEQ tablet Take by mouth. 04/02/20   [provider]  predniSONE (DELTASONE) 5 MG tablet TAKE 1 TABLET BY MOUTH EVERY DAY WITH BREAKFAST 11/06/20   Mannam, Praveen, MD  rosuvastatin (CRESTOR) 20 MG tablet TAKE 1 TABLET BY MOUTH EVERY DAY 05/15/20   Mosie Lukes, MD  Selexipag (UPTRAVI) 1200 MCG TABS Take 2,400 mcg by mouth 2 (two) times daily. Needs appt for further refills 10/23/20   Larey Dresser, MD  Spacer/Aero-Holding Chambers (AEROCHAMBER PLUS WITH MASK) inhaler  Use as instructed 01/28/18   Debbe Odea, MD  tadalafil, PAH, (ADCIRCA) 20 MG tablet TAKE 2 TABLETS BY MOUTH EVERY DAY 10/16/20   Larey Dresser, MD  traMADol (ULTRAM) 50 MG tablet Take 2 tablets (100 mg total) by mouth every 6 (six) hours as needed for moderate pain or severe pain. 06/17/18   Juanito Doom, MD  TRELEGY ELLIPTA 100-62.5-25 MCG/INH AEPB Inhale 1 puff into the lungs daily. 05/17/19  [provider]  vitamin B-12 (CYANOCOBALAMIN) 1000 MCG tablet Take 1,000 mcg by mouth daily.    [provider]    Physical Exam: Vitals:   01/02/21 1830 01/02/21 1845 01/02/21 1900 01/02/21 1945  BP: (!) 131/100 132/85 (!) 136/95 (!) 145/88  Pulse: 76 74 77 72  Resp: (!) _0 Temp:      TempSrc:      SpO2: 92% 92% 95% 92%  Weight:      Height:        Constitutional: NAD, calm, comfortable Vitals:   01/02/21 1830 01/02/21 1845 01/02/21 1900 01/02/21 1945  BP: (!) 131/100 132/85 (!) 136/95 (!) 145/88  Pulse: 76 74 77 72  Resp: (!) _1 Temp:      TempSrc:      SpO2: 92% 92% 95% 92%  Weight:      Height:       General: WDWN, Alert and oriented x3.  Eyes: EOMI, PERRL, conjunctivae normal.  Sclera nonicteric HENT:  [place]/AT, external ears normal.  Nares patent without epistasis.  Mucous membranes are moist. Posterior pharynx clear of any exudate or lesions. Normal dentition.  Neck: Soft, normal range of motion, supple, no masses, no thyromegaly.  Trachea midline Respiratory: Equal but diminished breath sounds. Has diffuse rales and expiratory wheezing, no crackles. Normal respiratory effort. No accessory muscle use.  Cardiovascular: Regular rate and rhythm, no murmurs / rubs / gallops. No extremity edema. 1+ pedal pulses.  Abdomen: Soft, no tenderness, nondistended, no rebound or guarding. No masses palpated. Bowel sounds normoactive Musculoskeletal: FROM. no cyanosis. No joint deformity upper and lower extremities. Normal muscle tone.  Skin: Warm,  dry, intact no rashes, lesions, ulcers. No induration Neurologic: CN 2-12 grossly intact.  Normal speech.  Sensation intact,  Strength 5/5 in all extremities.   Psychiatric: Normal judgment and insight.  Normal mood.    Labs on Admission: I have personally reviewed following labs and imaging studies  CBC: Recent Labs  Lab 01/02/21 1453  WBC 13.2*  NEUTROABS 10.9*  HGB 14.7  HCT 44.1  MCV 90.9  PLT 244    Basic Metabolic Panel: Recent Labs  Lab 01/02/21 1453  NA 136  K 4.2  CL 98  CO2 26  GLUCOSE 147*  BUN 20  CREATININE 1.18*  CALCIUM 9.3    GFR: Estimated Creatinine Clearance: 40.1 mL/min (A) (by C-G formula based on SCr of 1.18 mg/dL (H)).  Liver Function Tests: Recent Labs  Lab 01/02/21 1453  AST 30  ALT 20  ALKPHOS 42  BILITOT 1.6*  PROT 7.2  ALBUMIN 3.9    Urine analysis:    Component Value Date/Time   COLORURINE YELLOW 01/02/2021 2027   APPEARANCEUR CLEAR 01/02/2021 2027   LABSPEC 1.014 01/02/2021 2027   PHURINE 5.0 01/02/2021 2027   GLUCOSEU NEGATIVE 01/02/2021 2027   GLUCOSEU NEGATIVE 08/28/2014 1122   HGBUR SMALL (A) 01/02/2021 2027   HGBUR negative 06/01/2008 1356   BILIRUBINUR NEGATIVE 01/02/2021 2027   KETONESUR NEGATIVE 01/02/2021 2027   PROTEINUR NEGATIVE 01/02/2021 2027   UROBILINOGEN 0.2 08/28/2014 1122   NITRITE NEGATIVE 01/02/2021 2027   LEUKOCYTESUR NEGATIVE 01/02/2021 2027    Radiological Exams on Admission: DG Chest Port 1 View  Result Date: 01/02/2021 CLINICAL DATA:  Shortness of breath. EXAM: PORTABLE CHEST 1 VIEW COMPARISON:  January 27, 2018 chest radiograph; chest CT October 08, 2020 FINDINGS: There is chronic interstitial thickening in the lung bases. Lungs otherwise are clear  and unchanged. Heart is mildly enlarged with prominence of the main pulmonary arteries, a stable finding likely indicative of a degree of pulmonary arterial hypertension. No evident adenopathy. No bone lesions. IMPRESSION: There is cardiomegaly with  apparent pulmonary arterial hypertension. Suspect a degree of underlying chronic bronchitis. No edema or consolidation. Electronically Signed   By: Lowella Grip III M.D.   On: 01/02/2021 15:15    EKG: Independently reviewed.  AG shows normal sinus rhythm with nonspecific ST changes.  No acute STEMI noted.  Prolonged QTc of 503  Assessment/Plan Principal Problem:   Acute on chronic respiratory failure with hypoxia  Ms. Spitzley is admitted to medical telemetry floor.  Currently on high flow nasal cannula and tolerating well.  She is requiring more oxygen than she normally uses at home. Initial troponin was elevated at 69.  No chest pain. We will check serial troponin levels Check BNP level-level pending D-dimer is pending. If elevated will obtain CTA chest to ensure no PE as etiology of symptoms.  Pt initially required Bipap in the ER but has been weaned to HFNC.   Active Problems:   COPD with acute exacerbation  Continue home dose of Trelegy inhaler.  DuoNebs every 4 hours as needed.  Flutter valve ordered.  RT to follow. Patient is currently on high flow nasal cannula and maintaining O2 saturation.  She normally uses oxygen at 7 to 8 L/min by nasal cannula at home but has not increased oxygen requirement at this time    Essential hypertension Continue home dose of atenolol twice a day.  Monitor blood pressure.    Cardiomegaly Chronic.  Was September 2020 which showed LVH with EF of 60 to 65% with abnormal diastolic function Obtain echocardiogram the morning    Pulmonary HTN  Continue tadalafil    Chronic heart failure with preserved ejection fraction (HFpEF)  No signs of fluid overload at this time.  Patient does have cardiomegaly on chest x-ray.    Sarcoidosis stage I Patient is on chronic low-dose prednisone at 5 mg morning.  Patient will be on Solu-Medrol IV overnight and will resume prednisone tomorrow at a higher initial dose and then taper back to her chronic dose.     Hyperlipidemia Continue home dose of Crestor.  Continue coenzyme Q 10 that she takes at home    Hypothyroidism Continue home dose of levothyroxine.    Prolonged QT interval Avoid medications or could further prolong QT interval.  Monitor on telemetry.    DVT prophylaxis: [place] score elevated.  Lovenox for DVT prophylaxis Code Status:   DNR.  Patient has DNR work at bedside Family Communication:  Diagnosis and plan discussed with patient.  Questions answered.  Further recommendations to follow as clinically indicated Disposition Plan:   Patient is from:  Home  Anticipated DC to:  Home  Anticipated DC date:  Anticipate 2 midnight or more stay in the hospital  Anticipated DC barriers: No barriers to discharge identified at this time  Admission status:  inpatient  Eben Burow MD [facility]  How to contact the [facility] Attending or Consulting provider [place] or covering provider during after hours [place], for this patient?   1. Check the care team in [facility] and look for a) attending/consulting [facility] provider listed and b) the [facility] team listed 2. Log into [email or web address] and use [place]'s universal password to access. If you do not have the password, please contact the hospital operator. 3. Locate the [facility] provider you are  looking for under [facility] and page to a number that you can be directly reached. 4. If you still have difficulty reaching the provider, please page the [facility] (Director on Call) for the Hospitalists listed on amion for assistance.  01/02/2021, 9:11 PM

## 2021-01-02 NOTE — ED Notes (Signed)
The pts sats are up and down

## 2021-01-02 NOTE — ED Triage Notes (Signed)
Pt BIB [facility] from home. Initial call for EMS was to assist the patient after they slid out of their power chair at home. Upon further evaluation patient was found to be short of breath,satting at 68% on RA. Pt does have hx of COPD and does have O2 concentrator at home and will use 7 L per [place] as needed. EMS placed patient on 15 L NRB and the patient slowly improved to 92%. Pt does have some bilateral wheezing. EMS noted that patient usually is able to walk with walker at home but has gotten progressively weaker starting today, unable to care for self at home. Pt BP 118/78 HR 70s, NS, Temp 98.2, resp 20, Pt is AOx4.

## 2021-01-03 ENCOUNTER — Inpatient Hospital Stay (HOSPITAL_COMMUNITY): Payer: Medicare Other

## 2021-01-03 DIAGNOSIS — I5032 Chronic diastolic (congestive) heart failure: Secondary | ICD-10-CM

## 2021-01-03 DIAGNOSIS — I10 Essential (primary) hypertension: Secondary | ICD-10-CM

## 2021-01-03 LAB — ECHOCARDIOGRAM COMPLETE
AR max vel: 2.05 cm2
AV Area VTI: 2.25 cm2
AV Area mean vel: 2 cm2
AV Mean grad: 5 mmHg
AV Peak grad: 10 mmHg
Ao pk vel: 1.58 m/s
Area-P 1/2: 2.73 cm2
Height: 64 in
S' Lateral: 3.5 cm
Weight: 2624.36 oz

## 2021-01-03 LAB — BASIC METABOLIC PANEL
Anion gap: 12 (ref 5–15)
BUN: 22 mg/dL (ref 8–23)
CO2: 29 mmol/L (ref 22–32)
Calcium: 9.4 mg/dL (ref 8.9–10.3)
Chloride: 94 mmol/L — ABNORMAL LOW (ref 98–111)
Creatinine, Ser: 1.2 mg/dL — ABNORMAL HIGH (ref 0.44–1.00)
GFR, Estimated: 47 mL/min — ABNORMAL LOW (ref >60)
Glucose, Bld: 214 mg/dL — ABNORMAL HIGH (ref 70–99)
Potassium: 3.4 mmol/L — ABNORMAL LOW (ref 3.5–5.1)
Sodium: 135 mmol/L (ref 135–145)

## 2021-01-03 LAB — CBC
HCT: 40.7 % (ref 36.0–46.0)
Hemoglobin: 13.5 g/dL (ref 12.0–15.0)
MCH: 29.4 pg (ref 26.0–34.0)
MCHC: 33.2 g/dL (ref 30.0–36.0)
MCV: 88.7 fL (ref 80.0–100.0)
Platelets: 156 10*3/uL (ref 150–400)
RBC: 4.59 MIL/uL (ref 3.87–5.11)
RDW: 15.5 % (ref 11.5–15.5)
WBC: 8.9 10*3/uL (ref 4.0–10.5)
nRBC: 0 % (ref 0.0–0.2)

## 2021-01-03 LAB — TROPONIN I (HIGH SENSITIVITY): Troponin I (High Sensitivity): 70 ng/L — ABNORMAL HIGH (ref <18)

## 2021-01-03 MED ORDER — FUROSEMIDE 40 MG PO TABS
40.0000 mg | ORAL_TABLET | Freq: Every day | ORAL | Status: DC
  Administered 2021-01-03 – 2021-01-04 (×2): 40 mg via ORAL
  Filled 2021-01-03 (×2): qty 1

## 2021-01-03 MED ORDER — IPRATROPIUM-ALBUTEROL 0.5-2.5 (3) MG/3ML IN SOLN
3.0000 mL | RESPIRATORY_TRACT | Status: DC
  Administered 2021-01-03 (×2): 3 mL via RESPIRATORY_TRACT
  Filled 2021-01-03 (×2): qty 3

## 2021-01-03 MED ORDER — IPRATROPIUM-ALBUTEROL 0.5-2.5 (3) MG/3ML IN SOLN
3.0000 mL | RESPIRATORY_TRACT | Status: DC | PRN
  Administered 2021-01-04 (×2): 3 mL via RESPIRATORY_TRACT
  Filled 2021-01-03 (×2): qty 3

## 2021-01-03 MED ORDER — IOHEXOL 350 MG/ML SOLN
75.0000 mL | Freq: Once | INTRAVENOUS | Status: AC | PRN
  Administered 2021-01-03: 75 mL via INTRAVENOUS

## 2021-01-03 MED ORDER — SELEXIPAG 1400 MCG PO TABS
1400.0000 ug | ORAL_TABLET | Freq: Two times a day (BID) | ORAL | Status: DC
  Administered 2021-01-03 – 2021-01-08 (×10): 1400 ug via ORAL
  Filled 2021-01-03 (×10): qty 1

## 2021-01-03 MED ORDER — POTASSIUM CHLORIDE CRYS ER 10 MEQ PO TBCR
10.0000 meq | EXTENDED_RELEASE_TABLET | Freq: Every day | ORAL | Status: DC
  Administered 2021-01-04: 10 meq via ORAL
  Filled 2021-01-03: qty 1

## 2021-01-03 MED ORDER — FLUTICASONE FUROATE-VILANTEROL 100-25 MCG/INH IN AEPB
1.0000 | INHALATION_SPRAY | Freq: Every day | RESPIRATORY_TRACT | Status: DC
  Administered 2021-01-03 – 2021-01-14 (×12): 1 via RESPIRATORY_TRACT
  Filled 2021-01-03: qty 28

## 2021-01-03 MED ORDER — METHYLPREDNISOLONE SODIUM SUCC 125 MG IJ SOLR
60.0000 mg | Freq: Three times a day (TID) | INTRAMUSCULAR | Status: DC
  Administered 2021-01-03 – 2021-01-07 (×11): 60 mg via INTRAVENOUS
  Filled 2021-01-03 (×12): qty 2

## 2021-01-03 MED ORDER — OMEGA-3-ACID ETHYL ESTERS 1 G PO CAPS
1.0000 g | ORAL_CAPSULE | Freq: Every day | ORAL | Status: DC
  Administered 2021-01-03 – 2021-01-14 (×12): 1 g via ORAL
  Filled 2021-01-03 (×12): qty 1

## 2021-01-03 MED ORDER — FISH OIL 1200 MG PO CAPS: 1200.0000 mg | ORAL_CAPSULE | Freq: Every day | ORAL | Status: DC

## 2021-01-03 MED ORDER — GABAPENTIN 300 MG PO CAPS
300.0000 mg | ORAL_CAPSULE | Freq: Every day | ORAL | Status: DC
  Administered 2021-01-03 – 2021-01-13 (×11): 300 mg via ORAL
  Filled 2021-01-03 (×12): qty 1

## 2021-01-03 MED ORDER — POTASSIUM CHLORIDE CRYS ER 20 MEQ PO TBCR
60.0000 meq | EXTENDED_RELEASE_TABLET | Freq: Once | ORAL | Status: AC
  Administered 2021-01-03: 60 meq via ORAL
  Filled 2021-01-03: qty 3

## 2021-01-03 MED ORDER — UMECLIDINIUM BROMIDE 62.5 MCG/INH IN AEPB
1.0000 | INHALATION_SPRAY | Freq: Every day | RESPIRATORY_TRACT | Status: DC
  Administered 2021-01-03 – 2021-01-14 (×12): 1 via RESPIRATORY_TRACT
  Filled 2021-01-03 (×3): qty 7

## 2021-01-03 NOTE — Progress Notes (Signed)
PROGRESS NOTE    Kimberly Irwin  SWV:[id number] DOB: [birth year] DOA: 01/02/2021 PCP: Mosie Lukes, MD    Brief Narrative:  75 y.o. female with medical history significant for COPD, sarcoidosis, HTN, HLD, HFpEF, cardiomegaly, chronic respiratory failure with hypoxia on oxygen at 7-8 L/min by [place] at home who presents for evaluation of worsening SOB. She presented by EMS on NRB and was started on Bipap in the ER. She has now been weaned to [facility]   Assessment & Plan:   Principal Problem:   Acute on chronic respiratory failure with hypoxia (HCC) Active Problems:   Sarcoidosis stage I   Hyperlipidemia   Essential hypertension   Hypothyroidism   Cardiomegaly   Pulmonary HTN (HCC)   Chronic heart failure with preserved ejection fraction (HFpEF) (HCC)   COPD with acute exacerbation (HCC)   Prolonged QT interval  Principal Problem:   Acute on chronic respiratory failure with hypoxia  -Baseline O2 is around 8L -CT chest reviewed. Findings neg for PE, but with chronic emphysema with stable nodules, cardiomegaly -On exam, end-expiratory wheezing is appreciated -Will schedule bronchodilators -Appears comfortable this AM on high flow o2 -Pt initially required Bipap in the ER but has been weaned to HFNC.  -Outpt records reviewed. Pt is followed by Dr. Vaughan Browner. Initial concerns for sarcoid, however after further workup, no active disease was identified. Steroids were attempted to be weaned, however, pt noted to be more sob with steroid wean, thus remained on 14m prednisone PTA. Recent high res CT was concerning for fibrotic changes with concern for pulm fibrosis  Active Problems:   COPD with acute exacerbation  -Schedule bronchodilator tx -now on solumedrol  Patient is currently on high flow nasal cannula and maintaining O2 saturation.   -She normally uses oxygen at 7 to 8 L/min  -Cont to wean O2 as tolerated here    Essential hypertension -Continue home dose of atenolol twice a day.   Monitor blood pressure.    Cardiomegaly -Chronic.  Was September 2020 which showed LVH with EF of 60 to 65% with abnormal diastolic function -2d echo reviewed, EF 60-65% noted    Pulmonary HTN  -Continue tadalafil as tolerated    Chronic heart failure with preserved ejection fraction (HFpEF)  -No signs of fluid overload at this time.   -Patient does have cardiomegaly on chest x-ray. -2d echo with preserved EF noted    Sarcoidosis stage I -Pt had been on chronic 574mprednisone, per above -Currently on IV solumedrol per above -See above. No active sarcoid disease noted on recent CT imaging -Cont to f/u with Pulmonary    Hyperlipidemia -Continue home dose of Crestor.   -Continue coenzyme Q 10 that she takes at home    Hypothyroidism -Continue home dose of levothyroxine as tolerated    Prolonged QT interval -Avoid medications or could further prolong QT interval.  Monitor on telemetry.   DVT prophylaxis: Lovenox subq Code Status: DNR Family Communication: Pt in room, family not at bedside  Status is: Inpatient  Remains inpatient appropriate because:Inpatient level of care appropriate due to severity of illness   Dispo: The patient is from: Home              Anticipated d/c is to: Home              Patient currently is not medically stable to d/c.   Difficult to place patient No       Consultants:     Procedures:  Antimicrobials: Anti-infectives (From admission, onward)   None       Subjective: Reports feeling better   Objective: Vitals:   01/03/21 0739 01/03/21 1206 01/03/21 1215 01/03/21 1635  BP:  99/71  120/80  Pulse:  65  63  Resp:  20 (!) 22   Temp:  97.7 F (36.5 C)    TempSrc:  Oral    SpO2: 92% 93% 90% 92%  Weight:      Height:        Intake/Output Summary (Last 24 hours) at 01/03/2021 1845 Last data filed at 01/03/2021 6754 Gross per 24 hour  Intake 273.82 ml  Output 300 ml  Net -26.18 ml   Filed Weights   01/02/21  1456  Weight: 74.4 kg    Examination: General exam: Awake, laying in bed, in nad Respiratory system: Normal respiratory effort, end-expiratory wheeze Cardiovascular system: regular rate, s1, s2 Gastrointestinal system: Soft, nondistended, positive BS Central nervous system: CN2-12 grossly intact, strength intact Extremities: Perfused, no clubbing Skin: Normal skin turgor, no notable skin lesions seen Psychiatry: Mood normal // no visual hallucinations   Data Reviewed: I have personally reviewed following labs and imaging studies  CBC: Recent Labs  Lab 01/02/21 1453 01/03/21 0300  WBC 13.2* 8.9  NEUTROABS 10.9*  --   HGB 14.7 13.5  HCT 44.1 40.7  MCV 90.9 88.7  PLT 180 492   Basic Metabolic Panel: Recent Labs  Lab 01/02/21 1453 01/03/21 0300  NA 136 135  K 4.2 3.4*  CL 98 94*  CO2 26 29  GLUCOSE 147* 214*  BUN 20 22  CREATININE 1.18* 1.20*  CALCIUM 9.3 9.4   GFR: Estimated Creatinine Clearance: 39.4 mL/min (A) (by C-G formula based on SCr of 1.2 mg/dL (H)). Liver Function Tests: Recent Labs  Lab 01/02/21 1453  AST 30  ALT 20  ALKPHOS 42  BILITOT 1.6*  PROT 7.2  ALBUMIN 3.9   No results for input(s): LIPASE, AMYLASE in the last 168 hours. No results for input(s): AMMONIA in the last 168 hours. Coagulation Profile: No results for input(s): INR, PROTIME in the last 168 hours. Cardiac Enzymes: No results for input(s): CKTOTAL, CKMB, CKMBINDEX, TROPONINI in the last 168 hours. BNP (last 3 results) No results for input(s): PROBNP in the last 8760 hours. HbA1C: No results for input(s): HGBA1C in the last 72 hours. CBG: No results for input(s): GLUCAP in the last 168 hours. Lipid Profile: No results for input(s): CHOL, HDL, LDLCALC, TRIG, CHOLHDL, LDLDIRECT in the last 72 hours. Thyroid Function Tests: No results for input(s): TSH, T4TOTAL, FREET4, T3FREE, THYROIDAB in the last 72 hours. Anemia Panel: No results for input(s): VITAMINB12, FOLATE,  FERRITIN, TIBC, IRON, RETICCTPCT in the last 72 hours. Sepsis Labs: No results for input(s): PROCALCITON, LATICACIDVEN in the last 168 hours.  Recent Results (from the past 240 hour(s))  Resp Panel by RT-PCR (Flu A&B, Covid) Nasopharyngeal Swab     Status: None   Collection Time: 01/02/21  3:55 PM   Specimen: Nasopharyngeal Swab; Nasopharyngeal(NP) swabs in vial transport medium  Result Value Ref Range Status   SARS Coronavirus 2 by RT PCR NEGATIVE NEGATIVE Final    Comment: (NOTE) SARS-CoV-2 target nucleic acids are NOT DETECTED.  The SARS-CoV-2 RNA is generally detectable in upper respiratory specimens during the acute phase of infection. The lowest concentration of SARS-CoV-2 viral copies this assay can detect is 138 copies/mL. A negative result does not preclude SARS-Cov-2 infection and should not be used as the sole  basis for treatment or other patient management decisions. A negative result may occur with  improper specimen collection/handling, submission of specimen other than nasopharyngeal swab, presence of viral mutation(s) within the areas targeted by this assay, and inadequate number of viral copies(<138 copies/mL). A negative result must be combined with clinical observations, patient history, and epidemiological information. The expected result is Negative.  Fact Sheet for Patients:  EntrepreneurPulse.com.au  Fact Sheet for Healthcare Providers:  IncredibleEmployment.be  This test is no t yet approved or cleared by the [place] FDA and  has been authorized for detection and/or diagnosis of SARS-CoV-2 by FDA under an Emergency Use Authorization (EUA). This EUA will remain  in effect (meaning this test can be used) for the duration of the COVID-19 declaration under Section 564(b)(1) of the Act, 21 U.S.C.section 360bbb-3(b)(1), unless the authorization is terminated  or revoked sooner.       Influenza A by PCR NEGATIVE  NEGATIVE Final   Influenza B by PCR NEGATIVE NEGATIVE Final    Comment: (NOTE) The Xpert Xpress SARS-CoV-2/FLU/RSV plus assay is intended as an aid in the diagnosis of influenza from Nasopharyngeal swab specimens and should not be used as a sole basis for treatment. Nasal washings and aspirates are unacceptable for Xpert Xpress SARS-CoV-2/FLU/RSV testing.  Fact Sheet for Patients: EntrepreneurPulse.com.au  Fact Sheet for Healthcare Providers: IncredibleEmployment.be  This test is not yet approved or cleared by the [place] FDA and has been authorized for detection and/or diagnosis of SARS-CoV-2 by FDA under an Emergency Use Authorization (EUA). This EUA will remain in effect (meaning this test can be used) for the duration of the COVID-19 declaration under Section 564(b)(1) of the Act, 21 U.S.C. section 360bbb-3(b)(1), unless the authorization is terminated or revoked.  Performed at [facility], [place] [address], [place], [place] [address]      Radiology Studies: DG Thoracic Spine 2 View  Result Date: 01/03/2021 CLINICAL DATA:  75 year old female with back pain. EXAM: THORACIC SPINE 2 VIEWS COMPARISON:  CTA chest 0358 hours today. FINDINGS: Normal thoracic segmentation. Mild T3 superior endplate compression with superior endplate sclerosis better demonstrated by CT this morning, and stable since 10/08/2020. Similar T1 inferior endplate compression also better demonstrated by CT and stable. On the lateral images today there is subtle compression of the T12 inferior endplate when compared to radiographs in 2019, and also a chest CT 09/20/2019. This level was not included on the CT earlier today. Other thoracic vertebrae appear intact. Partially visible lumbar spine hardware, abdominal Calcified aortic atherosclerosis. IMPRESSION: 1. Mild T12 inferior endplate compression fracture, new since last year but age indeterminate. If specific therapy  such as vertebroplasty is desired, Lumbar MRI or Nuclear Medicine Whole-body Bone Scan would best determine acuity. 2. No other acute osseous abnormality identified in the thoracic spine, with the levels above T12 best demonstrated on the CTA chest earlier today (please see that report) . Electronically Signed   By: Genevie Ann M.D.   On: 01/03/2021 07:38   DG Lumbar Spine 2-3 Views  Result Date: 01/03/2021 CLINICAL DATA:  Back pain. EXAM: LUMBAR SPINE - 2-3 VIEW COMPARISON:  Lumbar spine series 12/06/2014 FINDINGS: Lumbar spine numbered the lowest segmented appearing lumbar shaped vertebrae on lateral view as L5. L2-L3 left lateral fusion. L3 through L5 posterior interbody fusion. Prior vertebroplasty L2. Similar findings noted on prior exam. Hardware intact. Stable alignment. Stable mild anterolisthesis L4 on L5. Diffuse osteopenia and degenerative change. No acute bony abnormality. Total right hip replacement.  Contrast from prior CT noted the renal collecting systems. Left renal stone cannot be excluded. Aortoiliac atherosclerotic vascular calcification. IMPRESSION: 1. Stable postsurgical changes lumbar spine. Hardware intact. Stable alignment. Stable mild anterolisthesis L4 on L5. Diffuse osteopenia and degenerative change. No acute bony abnormality identified. 2.  Left renal stone cannot be excluded. 3.  Aortoiliac atherosclerotic vascular disease. Electronically Signed   By: Marcello Moores  Register   On: 01/03/2021 07:36   CT ANGIO CHEST PE W OR WO CONTRAST  Result Date: 01/03/2021 CLINICAL DATA:  75 year old female with sarcoidosis. On home oxygen. Shortness of breath. EXAM: CT ANGIOGRAPHY CHEST WITH CONTRAST TECHNIQUE: Multidetector CT imaging of the chest was performed using the standard protocol during bolus administration of intravenous contrast. Multiplanar CT image reconstructions and MIPs were obtained to evaluate the vascular anatomy. CONTRAST:  17m OMNIPAQUE IOHEXOL 350 MG/ML SOLN COMPARISON:  Portable  chest 01/02/2021. High-resolution chest CT 10/08/2020. CTA chest 10/17/2019. FINDINGS: Cardiovascular: Excellent contrast bolus timing in the pulmonary arterial tree. Mild respiratory motion at the lung bases. No focal filling defect identified in the pulmonary arteries to suggest acute pulmonary embolism. A degree of chronic central pulmonary artery enlargement is stable since 2021. Tortuous thoracic aorta with calcified atherosclerosis. Tortuous proximal great vessels. Mild to moderate cardiomegaly. No pericardial effusion. Calcified coronary artery atherosclerosis is extensive. Mediastinum/Nodes: Fairly small mediastinal lymph nodes are stable since last year. Lungs/Pleura: Major airways are stable and patent. Lower lung volumes, with increased lung base atelectasis compared to March this year. Centrilobular emphysema. Stable small right lung nodule on series 7, 2 small right lung nodules remain stable and are benign by criteria. No pleural effusion or other acute pulmonary opacity. Upper Abdomen: Stable and negative visible liver, spleen, stomach, left adrenal gland. Musculoskeletal: Osteopenia. Mild T3 superior endplate compression fracture is new from last year but stable since March. Similar stable since March mild compression of the inferior T1 endplate. No acute osseous abnormality identified. Review of the MIP images confirms the above findings. IMPRESSION: 1. Negative for acute pulmonary embolus. 2. Chronic Emphysema (ICD10-J43.9) with occasional stable and benign pulmonary nodules. Lower lung volumes today with mild atelectasis. 3. Cardiomegaly. Calcified coronary artery and Aortic Atherosclerosis (ICD10-I70.0), tortuosity. 4. Mild T1 and T3 compression fractures are new since 2021 but stable since March. Electronically Signed   By: HGenevie AnnM.D.   On: 01/03/2021 04:21   DG Chest Port 1 View  Result Date: 01/02/2021 CLINICAL DATA:  Shortness of breath. EXAM: PORTABLE CHEST 1 VIEW COMPARISON:  January 27, 2018 chest radiograph; chest CT October 08, 2020 FINDINGS: There is chronic interstitial thickening in the lung bases. Lungs otherwise are clear and unchanged. Heart is mildly enlarged with prominence of the main pulmonary arteries, a stable finding likely indicative of a degree of pulmonary arterial hypertension. No evident adenopathy. No bone lesions. IMPRESSION: There is cardiomegaly with apparent pulmonary arterial hypertension. Suspect a degree of underlying chronic bronchitis. No edema or consolidation. Electronically Signed   By: WLowella GripIII M.D.   On: 01/02/2021 15:15   ECHOCARDIOGRAM COMPLETE  Result Date: 01/03/2021    ECHOCARDIOGRAM REPORT   Patient Name:   SSHANEE BATCHDate of Exam: 01/03/2021 Medical Rec #:  [id number]    Height:       64.0 in Accession #:    [id number]   Weight:       164.0 lb Date of Birth:  [birth year]     BSA:  1.798 m Patient Age:    26 years      BP:           110/83 mmHg Patient Gender: F             HR:           63 bpm. Exam Location:  Inpatient Procedure: 2D Echo, Cardiac Doppler and Color Doppler Indications:    CHF  History:        Patient has prior history of Echocardiogram examinations, most                 recent 04/28/2019. COPD; Risk Factors:Hypertension, Dyslipidemia                 and Former Smoker.  Sonographer:    Cammy Brochure Referring Phys: [id number] Eben Burow  Sonographer Comments: Image acquisition challenging due to patient body habitus, Image acquisition challenging due to respiratory motion and Image acquisition challenging due to COPD. IMPRESSIONS  1. Left ventricular ejection fraction, by estimation, is 60 to 65%. The left ventricle has normal function. The left ventricle has no regional wall motion abnormalities. There is mild concentric left ventricular hypertrophy. Left ventricular diastolic parameters are consistent with Grade I diastolic dysfunction (impaired relaxation).  2. Right ventricular systolic function is normal.  The right ventricular size is moderately enlarged. There is moderately elevated pulmonary artery systolic pressure. The estimated right ventricular systolic pressure is 41.4 mmHg.  3. The mitral valve is normal in structure. No evidence of mitral valve regurgitation. No evidence of mitral stenosis.  4. The aortic valve is tricuspid. Aortic valve regurgitation is not visualized. Mild to moderate aortic valve sclerosis/calcification is present, without any evidence of aortic stenosis. Aortic valve area, by VTI measures 2.25 cm. Aortic valve mean gradient measures 5.0 mmHg. Aortic valve Vmax measures 1.58 m/s.  5. The inferior vena cava is normal in size with greater than 50% respiratory variability, suggesting right atrial pressure of 3 mmHg. FINDINGS  Left Ventricle: Left ventricular ejection fraction, by estimation, is 60 to 65%. The left ventricle has normal function. The left ventricle has no regional wall motion abnormalities. The left ventricular internal cavity size was normal in size. There is  mild concentric left ventricular hypertrophy. Left ventricular diastolic parameters are consistent with Grade I diastolic dysfunction (impaired relaxation). Normal left ventricular filling pressure. Right Ventricle: The right ventricular size is moderately enlarged. No increase in right ventricular wall thickness. Right ventricular systolic function is normal. There is moderately elevated pulmonary artery systolic pressure. The tricuspid regurgitant  velocity is 3.47 m/s, and with an assumed right atrial pressure of 3 mmHg, the estimated right ventricular systolic pressure is 23.9 mmHg. Left Atrium: Left atrial size was normal in size. Right Atrium: Right atrial size was normal in size. Pericardium: There is no evidence of pericardial effusion. Mitral Valve: The mitral valve is normal in structure. No evidence of mitral valve regurgitation. No evidence of mitral valve stenosis. Tricuspid Valve: The tricuspid valve is  normal in structure. Tricuspid valve regurgitation is mild . No evidence of tricuspid stenosis. Aortic Valve: The aortic valve is tricuspid. Aortic valve regurgitation is not visualized. Mild to moderate aortic valve sclerosis/calcification is present, without any evidence of aortic stenosis. Aortic valve mean gradient measures 5.0 mmHg. Aortic valve peak gradient measures 10.0 mmHg. Aortic valve area, by VTI measures 2.25 cm. Pulmonic Valve: The pulmonic valve was normal in structure. Pulmonic valve regurgitation is not visualized. No evidence of pulmonic stenosis.  Aorta: The aortic root is normal in size and structure. Venous: The inferior vena cava is normal in size with greater than 50% respiratory variability, suggesting right atrial pressure of 3 mmHg. IAS/Shunts: No atrial level shunt detected by color flow Doppler.  LEFT VENTRICLE PLAX 2D LVIDd:         4.90 cm  Diastology LVIDs:         3.50 cm  LV e' medial:   4.68 cm/s LV PW:         1.10 cm  LV E/e' medial: 9.1 LV IVS:        1.20 cm LVOT diam:     2.00 cm LV SV:         67 LV SV Index:   37 LVOT Area:     3.14 cm  RIGHT VENTRICLE             IVC RV Basal diam:  4.70 cm     IVC diam: 1.70 cm RV S prime:     11.20 cm/s LEFT ATRIUM             Index       RIGHT ATRIUM           Index LA diam:        4.20 cm 2.34 cm/m  RA Area:     18.30 cm LA Vol (A2C):   39.6 ml 22.02 ml/m RA Volume:   44.90 ml  24.97 ml/m LA Vol (A4C):   42.5 ml 23.63 ml/m LA Biplane Vol: 41.6 ml 23.13 ml/m  AORTIC VALVE AV Area (Vmax):    2.05 cm AV Area (Vmean):   2.00 cm AV Area (VTI):     2.25 cm AV Vmax:           158.00 cm/s AV Vmean:          104.000 cm/s AV VTI:            0.298 m AV Peak Grad:      10.0 mmHg AV Mean Grad:      5.0 mmHg LVOT Vmax:         103.00 cm/s LVOT Vmean:        66.100 cm/s LVOT VTI:          0.213 m LVOT/AV VTI ratio: 0.71  AORTA Ao Root diam: 3.30 cm Ao Asc diam:  3.60 cm MITRAL VALVE               TRICUSPID VALVE MV Area (PHT): 2.73 cm    TR  Peak grad:   48.2 mmHg MV Decel Time: 278 msec    TR Vmax:        347.00 cm/s MV E velocity: 42.80 cm/s MV A velocity: 97.30 cm/s  SHUNTS MV E/A ratio:  0.44        Systemic VTI:  0.21 m                            Systemic Diam: 2.00 cm Fransico Him MD Electronically signed by Fransico Him MD Signature Date/Time: 01/03/2021/4:48:06 PM    Final     Scheduled Meds: . aspirin EC  81 mg Oral QHS  . atenolol  25 mg Oral QHS  . atenolol  50 mg Oral Daily  . enoxaparin (LOVENOX) injection  40 mg Subcutaneous Daily  . fluticasone furoate-vilanterol  1 puff Inhalation Daily   And  . umeclidinium bromide  1 puff  Inhalation Daily  . furosemide  40 mg Oral Daily  . gabapentin  300 mg Oral QHS  . ipratropium-albuterol  3 mL Nebulization Q4H  . levothyroxine  112 mcg Oral QAC breakfast  . methylPREDNISolone (SOLU-MEDROL) injection  60 mg Intravenous Q8H  . omega-3 acid ethyl esters  1 g Oral Daily  . pantoprazole  40 mg Oral Daily  . [START ON 01/04/2021] potassium chloride  10 mEq Oral Daily  . rosuvastatin  20 mg Oral Daily  . Selexipag  1,400 mcg Oral BID  . tadalafil (PAH)  40 mg Oral Daily   Continuous Infusions: . sodium chloride 50 mL/hr at 01/03/21 0033     LOS: 1 day   Marylu Lund, MD [facility] Pager On Amion  If 7PM-7AM, please contact night-coverage 01/03/2021, 6:45 PM

## 2021-01-03 NOTE — Progress Notes (Signed)
PHARMACIST - PHYSICIAN ORDER COMMUNICATION  CONCERNING: P&T Medication Policy on Herbal Medications  DESCRIPTION:  This patient's order for:  Co Enzyme Q10  has been noted.  This product(s) is classified as an "herbal" or natural product. Due to a lack of definitive safety studies or FDA approval, nonstandard manufacturing practices, plus the potential risk of unknown drug-drug interactions while on inpatient medications, the Pharmacy and Therapeutics Committee does not permit the use of "herbal" or natural products of this type within [facility].   ACTION TAKEN: The pharmacy department is unable to verify this order at this time and your patient has been informed of this safety policy. Please reevaluate patient's clinical condition at discharge and address if the herbal or natural product(s) should be resumed at that time.   

## 2021-01-03 NOTE — Plan of Care (Signed)

## 2021-01-03 NOTE — Progress Notes (Signed)
RT NOTE:  RT assisted with transporting [place] to 2w31 with patient. Pt switched to 100% NRB during transport. Pt tolerated well. Spo2 maintained >95%. Pt placed back on 30L/80% HHFNC once in room. Report given to [place], RT

## 2021-01-03 NOTE — Progress Notes (Signed)
  Echocardiogram 2D Echocardiogram has been performed.  Kimberly Irwin 01/03/2021, 3:04 PM

## 2021-01-03 NOTE — Telephone Encounter (Signed)
Patient currently admitted inpatient for hypoxia. Will call to discuss Esbriet approval post-discharge.  Knox Saliva, PharmD, MPH Clinical Pharmacist (Rheumatology and Pulmonology)

## 2021-01-04 DIAGNOSIS — I270 Primary pulmonary hypertension: Secondary | ICD-10-CM | POA: Diagnosis not present

## 2021-01-04 DIAGNOSIS — J9621 Acute and chronic respiratory failure with hypoxia: Principal | ICD-10-CM

## 2021-01-04 LAB — COMPREHENSIVE METABOLIC PANEL
ALT: 15 U/L (ref 0–44)
AST: 16 U/L (ref 15–41)
Albumin: 3.3 g/dL — ABNORMAL LOW (ref 3.5–5.0)
Alkaline Phosphatase: 35 U/L — ABNORMAL LOW (ref 38–126)
Anion gap: 8 (ref 5–15)
BUN: 24 mg/dL — ABNORMAL HIGH (ref 8–23)
CO2: 27 mmol/L (ref 22–32)
Calcium: 9.3 mg/dL (ref 8.9–10.3)
Chloride: 103 mmol/L (ref 98–111)
Creatinine, Ser: 1.11 mg/dL — ABNORMAL HIGH (ref 0.44–1.00)
GFR, Estimated: 52 mL/min — ABNORMAL LOW (ref >60)
Glucose, Bld: 134 mg/dL — ABNORMAL HIGH (ref 70–99)
Potassium: 5 mmol/L (ref 3.5–5.1)
Sodium: 138 mmol/L (ref 135–145)
Total Bilirubin: 0.7 mg/dL (ref 0.3–1.2)
Total Protein: 6 g/dL — ABNORMAL LOW (ref 6.5–8.1)

## 2021-01-04 LAB — CBC
HCT: 38 % (ref 36.0–46.0)
Hemoglobin: 12.8 g/dL (ref 12.0–15.0)
MCH: 30.5 pg (ref 26.0–34.0)
MCHC: 33.7 g/dL (ref 30.0–36.0)
MCV: 90.7 fL (ref 80.0–100.0)
Platelets: 158 10*3/uL (ref 150–400)
RBC: 4.19 MIL/uL (ref 3.87–5.11)
RDW: 15.5 % (ref 11.5–15.5)
WBC: 11.8 10*3/uL — ABNORMAL HIGH (ref 4.0–10.5)
nRBC: 0 % (ref 0.0–0.2)

## 2021-01-04 LAB — BRAIN NATRIURETIC PEPTIDE: B Natriuretic Peptide: 448 pg/mL — ABNORMAL HIGH (ref 0.0–100.0)

## 2021-01-04 MED ORDER — FUROSEMIDE 10 MG/ML IJ SOLN
40.0000 mg | Freq: Two times a day (BID) | INTRAMUSCULAR | Status: DC
  Administered 2021-01-05 – 2021-01-10 (×12): 40 mg via INTRAVENOUS
  Filled 2021-01-04 (×12): qty 4

## 2021-01-04 MED ORDER — FUROSEMIDE 10 MG/ML IJ SOLN
40.0000 mg | Freq: Once | INTRAMUSCULAR | Status: AC
  Administered 2021-01-04: 40 mg via INTRAVENOUS
  Filled 2021-01-04: qty 4

## 2021-01-04 MED ORDER — ALUM & MAG HYDROXIDE-SIMETH 200-200-20 MG/5ML PO SUSP
30.0000 mL | ORAL | Status: DC | PRN
  Administered 2021-01-04 – 2021-01-06 (×3): 30 mL via ORAL
  Filled 2021-01-04 (×3): qty 30

## 2021-01-04 NOTE — Consult Note (Addendum)
NAME:  Kimberly Irwin, MRN:  [id number], DOB:  May 31, 1946, LOS: 2 ADMISSION DATE:  01/02/2021, CONSULTATION DATE:  01/04/2021 REFERRING MD:  Maylene Roes - TRH , CHIEF COMPLAINT:  SOB, hypoxia   History of Present Illness:  75 yo F PMH sarcoidosis (no active disease but remains on steroids for SOB), Emphysema, HFpEF , pulmonary HTN, Chronic hypoxic respiratory failure on 8LNC admitted to [facility] 6/1 with hypoxia after a fall from chair to knees, after which EMS was dispatched and brought pt to hospital due to hypoxia. Pt does endorse increasing SOB in the evening for about a week ever since her last Uptravi decrease. Also endorses an intermittent cough associated  with white mucus. Denied fever, chest pain, palpitations, sick contacts, new environmental exposures/allergens. CTA chest without evidence of PE. Exam findings most consistent with AECOPD, was started on solumedrol 6/2 and continued on BDs. COVID is negative, no RVP sent, not on abx. Since admission 6/1, O2 requirements have de-escalated from NRB , BiPAP to HFNC but have remained significantly higher than baseline. Currently on HFNC 80% FiO2 20 L/min PCCM consulted 6/3 for persistent hypoxia, inability to wean O2.     Of note, recent outpatient HRCT 10/08/20 with some fibrotic changes, paraseptal and centrilobular emphysema, diffuse bronchial wall thickening  In review of documentation from [facility] (where pt follows for pulmonary HTN) it looks like Uptravi was recently decreased from 2400 to 1700 to 1400 (last decrease approx 1week PTA)  Pertinent  Medical History  Sarcodisis  Pulmonary HTN HFpEF Emphysema  Chronic hypoxic respiratory failure  HTN HLD DDD S/p L3-4, 4-5posterior laminectomy, fusion, fixation GERD Hiatal hernia Chronic tremor  Hypothyroidism Significant Hospital Events: Including procedures, antibiotic start and stop dates in addition to other pertinent events   . 6/1 admitted to [facility] for acute on chronic hypoxia  . 6/3 still on  HFNC, PCCM consulted for inability to wean O2   Interim History / Subjective:  On HFNC 80% 20L/min  Decreased to 70% Objective   Blood pressure 130/80, pulse 62, temperature 97.6 F (36.4 C), temperature source Oral, resp. rate (!) 24, height _0  (1.626 m), weight 74.4 kg, SpO2 93 %.    FiO2 (%):  [80 %] 80 %   Intake/Output Summary (Last 24 hours) at 01/04/2021 1617 Last data filed at 01/04/2021 1200 Gross per 24 hour  Intake --  Output 1000 ml  Net -1000 ml   Filed Weights   01/02/21 1456 01/04/21 0402  Weight: 74.4 kg 74.4 kg    Examination: General: Chronically ill appeaaing older adult F, reclined in bed NAD  HENT: NCAT. Anicteric sclera. Pink mmm  Lungs: Symmetrical chest expansion, unlabored. No rhonchi. +wheeze  Cardiovascular: rrr cap refill <3sec +5JVD  Abdomen: soft round ndnt  Extremities: Clubbing. No acute joint deformity, no cyanosis. No pitting edema  Neuro: Fine tremor.  GU: purewick, yellow urine  Labs/imaging that I havepersonally reviewed  (right click and "Reselect all SmartList Selections" daily)  HRCT 10/08/20 with some fibrotic changes, paraseptal and centrilobular emphysema, diffuse bronchial wall thickening   Out pt autoimmune/rheum labs -- unremarkable except ANA positive, speckled 1:40  CTA chest 6/2> no PE. Tortuous vessels. Patent major airways, low lung volumes. Centrolobular emphysema. Stable nodules.   6/1 BNP 450  6/3 CMP CBC   Resolved Hospital Problem list     Assessment & Plan:   Acute on chronic hypoxic respiratory failure -multifactorial but I think the acute worsening may largely be related to pulmonary HTN.  -at  home, on 6-8L. Trelegy inhaler, duonebs  Emphysema Sarcoidosis  ? UIP -autoimmune w/u pretty normal -ANA positive, speckled, but titer only 1:40  -Esbreit not yet started, just got approved. Doubt this explains much of current presentation though Pulmonary HTN (WHO I)  - Uptravi recently decreased in late May , I  wonder if she is not tolerating this latest decrease (went from 2400 to 1700 to 1400 in a span of a couple weeks, last decr to 1400 less than a week ago) -BNP on admission 500 -6/3 BNP ordered, pending  P -BDs  -solumedrol. Takes chronic predisone 36m  -tadalafil, selexipag, UMalvin Johns -will talk to pharmacist about increasing Uptravi to 1700  -check BNP (was 500 on admit) -will incr diuresis to 414mlasix BID -AM BNP, BMP  -IS, mobility -- will be very important for this patient, need to get PT and really push mobility while inpatient  -wean O2 as able. Decreased to FiO2 70% flow 20L/min   Best practice (right click and "Reselect all SmartList Selections" daily)  Diet:  Oral Pain/Anxiety/Delirium protocol (if indicated): No VAP protocol (if indicated): Not indicated DVT prophylaxis: LMWH GI prophylaxis: PPI Glucose control:  SSI No Central venous access:  N/A Arterial line:  N/A Foley:  N/A Mobility:  OOB  PT consulted: Yes Last date of multidisciplinary goals of care discussion [per primary] Code Status:  DNR Disposition: med tele   Labs   CBC: Recent Labs  Lab 01/02/21 1453 01/03/21 0300 01/04/21 0052  WBC 13.2* 8.9 11.8*  NEUTROABS 10.9*  --   --   HGB 14.7 13.5 12.8  HCT 44.1 40.7 38.0  MCV 90.9 88.7 90.7  PLT 180 156 15892  Basic Metabolic Panel: Recent Labs  Lab 01/02/21 1453 01/03/21 0300 01/04/21 0052  NA 136 135 138  K 4.2 3.4* 5.0  CL 98 94* 103  CO2 _0 GLUCOSE 147* 214* 134*  BUN 20 22 24*  CREATININE 1.18* 1.20* 1.11*  CALCIUM 9.3 9.4 9.3   GFR: Estimated Creatinine Clearance: 42.6 mL/min (A) (by C-G formula based on SCr of 1.11 mg/dL (H)). Recent Labs  Lab 01/02/21 1453 01/03/21 0300 01/04/21 0052  WBC 13.2* 8.9 11.8*    Liver Function Tests: Recent Labs  Lab 01/02/21 1453 01/04/21 0052  AST 30 16  ALT 20 15  ALKPHOS 42 35*  BILITOT 1.6* 0.7  PROT 7.2 6.0*  ALBUMIN 3.9 3.3*   No results for input(s): LIPASE, AMYLASE  in the last 168 hours. No results for input(s): AMMONIA in the last 168 hours.  ABG    Component Value Date/Time   HCO3 24.7 11/19/2017 1357   TCO2 26 11/19/2017 1357   O2SAT 62.0 11/19/2017 1357     Coagulation Profile: No results for input(s): INR, PROTIME in the last 168 hours.  Cardiac Enzymes: No results for input(s): CKTOTAL, CKMB, CKMBINDEX, TROPONINI in the last 168 hours.  HbA1C: Hgb A1c MFr Bld  Date/Time Value Ref Range Status  09/14/2018 05:51 PM 5.4 4.6 - 6.5 % Final    Comment:    Glycemic Control Guidelines for People with Diabetes:Non Diabetic:  <6%Goal of Therapy: <7%Additional Action Suggested:  >8%   06/19/2017 11:35 AM 5.5 4.6 - 6.5 % Final    Comment:    Glycemic Control Guidelines for People with Diabetes:Non Diabetic:  <6%Goal of Therapy: <7%Additional Action Suggested:  >8%     CBG: No results for input(s): GLUCAP in the last 168 hours.  Review of  Systems:   Full ROS performed. Positives as per HPI   Past Medical History:  She,  has a past medical history of Bursitis of left hip (10/26/2012), Chronic low back pain, Chronic respiratory failure with hypoxia ([place]) (10/21/2016), Coronary artery calcification (07/03/2016), DDD (degenerative disc disease), Degenerative spondylolisthesis, Emphysema lung ([place]), GERD (gastroesophageal reflux disease), Hiatal hernia, Hyperlipidemia, Hypertension, Hypothyroidism (10/28/2015), Pain in joint, lower leg (10/26/2012), Pneumonia, Pulmonary hypertension ([place]), Sarcoidosis, and Tremors of nervous system.   Surgical History:   Past Surgical History:  Procedure Laterality Date  . ANTERIOR LAT LUMBAR FUSION Left 08/29/2014   Procedure: EXTREME LEFT LATERAL INTERBODY FUSION LUMBAR TWO-THREE LATERAL PLATE;  Surgeon: Charlie Pitter, MD;  Location: [place] [facility];  Service: Neurosurgery;  Laterality: Left;  . BACK SURGERY  01/15/09   L3-4 and L4-5 decompressive laminectomy with bilateral L3, L4, and L5 decompressive foraminotomies,  more than it would be required for simple interbody fusion alone.  Marland Kitchen BACK SURGERY  01/15/09   L3-4 and L4-5 posterior lumbar interbody fusion utilizing tanget interbody allograft wedge, Telamon interbody PEEk cage, and local autografting.  Marland Kitchen BACK SURGERY  01/15/09   L3, L4, and L5 posterolateral arthrodesis using segmental pedicle screw fixation and local autografting.  Marland Kitchen CARDIAC CATHETERIZATION    . CARPAL TUNNEL RELEASE Bilateral   . COLONOSCOPY    . ESOPHAGOGASTRODUODENOSCOPY    . EYE SURGERY     lens implant  . JOINT REPLACEMENT  09/13/09   Right Hip, Dr. Alvan Dame  . KYPHOPLASTY N/A 09/14/2014   Procedure: Lumbar Two Kyphoplasty/Vertebroplasty;  Surgeon: Charlie Pitter, MD;  Location: [place] [facility];  Service: Neurosurgery;  Laterality: N/A;  Lumbar Two Kyphoplasty/Vertebroplasty  . RIGHT HEART CATH N/A 11/19/2017   Procedure: RIGHT HEART CATH;  Surgeon: Larey Dresser, MD;  Location: [place] [facility];  Service: Cardiovascular;  Laterality: N/A;  . RIGHT HEART CATH N/A 05/27/2019   Procedure: RIGHT HEART CATH;  Surgeon: Larey Dresser, MD;  Location: [place] [facility];  Service: Cardiovascular;  Laterality: N/A;  . THYROIDECTOMY    . TOTAL HIP ARTHROPLASTY Left 06/28/2013   Procedure: LEFT TOTAL  HIP ARTHROPLASTY ANTERIOR APPROACH;  Surgeon: Mauri Pole, MD;  Location: [facility];  Service: Orthopedics;  Laterality: Left;     Social History:   reports that she quit smoking about 26 years ago. Her smoking use included cigarettes. She has a 16.00 pack-year smoking history. She has never used smokeless tobacco. She reports that she does not drink alcohol and does not use drugs.   Family History:  Her family history includes Asthma in her maternal grandmother; Cancer (age of onset: 13) in her mother; Heart attack in her mother; Heart disease in her mother; Hyperlipidemia in her mother; Hypertension in her mother.   Allergies Allergies  Allergen Reactions  . Lipitor [Atorvastatin] Swelling   . Penicillins Swelling    Has patient had a PCN reaction causing immediate rash, facial/tongue/throat swelling, SOB or lightheadedness with hypotension: Yes Has patient had a PCN reaction causing severe rash involving mucus membranes or skin necrosis: No Has patient had a PCN reaction that required hospitalization: No Has patient had a PCN reaction occurring within the last 10 years: No If all of the above answers are "NO", then may proceed with Cephalosporin use.   . Metronidazole Swelling  . Pregabalin Other (See Comments)    REACTION: Somnolence and dizziness  . Simvastatin Other (See Comments)    Leg pain     Home  Medications  Prior to Admission medications   Medication Sig Start Date End Date Taking? Authorizing Provider  acetaminophen (TYLENOL) 500 MG tablet Take 1,000 mg by mouth every 6 (six) hours as needed for moderate pain.   Yes [provider]  albuterol (PROVENTIL) (2.5 MG/3ML) 0.083% nebulizer solution Take 2.5 mg by nebulization every 6 (six) hours as needed for wheezing or shortness of breath.   Yes [provider]  aspirin EC 81 MG tablet Take 81 mg by mouth at bedtime.    Yes [provider]  atenolol (TENORMIN) 25 MG tablet TAKE 1 TABLET (25 MG TOTAL) BY MOUTH AT BEDTIME. TAKE THE 50 MG ATENOLOL IN AM AND THE 25 MG IN PM Patient taking differently: Take 25 mg by mouth at bedtime. 08/15/20  Yes Mosie Lukes, MD  atenolol (TENORMIN) 50 MG tablet TAKE 1 TABLET BY MOUTH EVERY DAY Patient taking differently: Take 50 mg by mouth every morning. 08/15/20  Yes Mosie Lukes, MD  BESIVANCE 0.6 % SUSP Place 1 drop into the left eye See admin instructions. Only use : 1 DROP INTO LEFT 4 TIMES DAILY x 2 DAYS AFTER EYE INJECTION 12/18/14  Yes [provider]  calcium carbonate (OSCAL) 1500 (600 Ca) MG TABS tablet Take 1,500 mg by mouth daily with breakfast.    Yes [provider]  Cholecalciferol (VITAMIN D) 2000 UNITS tablet Take 2,000  Units by mouth every evening.    Yes [provider]  CINNAMON PO Take 2,000 mg by mouth daily.   Yes [provider]  citalopram (CELEXA) 10 MG tablet Take 1 tablet (10 mg total) by mouth daily. 09/14/20  Yes Saguier, Percell Miller, PA-C  Coenzyme Q10 200 MG capsule Take 200 mg by mouth at bedtime.    Yes [provider]  furosemide (LASIX) 20 MG tablet Take 20 mg by mouth every other day. 04/02/20  Yes [provider]  gabapentin (NEURONTIN) 300 MG capsule Take 1 capsule (300 mg total) by mouth at bedtime. 09/14/20  Yes Saguier, Percell Miller, PA-C  hydrochlorothiazide (MICROZIDE) 12.5 MG capsule TAKE 1 CAPSULE BY MOUTH EVERY DAY Patient taking differently: Take 12.5 mg by mouth daily. 02/07/20  Yes Larey Dresser, MD  ketoconazole (NIZORAL) 2 % shampoo Apply 1 application topically daily as needed for irritation (scalp).   Yes [provider]  levocetirizine (XYZAL) 5 MG tablet TAKE 1 TABLET BY MOUTH EVERY DAY IN THE EVENING Patient taking differently: Take 5 mg by mouth every evening. 12/06/20  Yes Saguier, Percell Miller, PA-C  levothyroxine (SYNTHROID) 112 MCG tablet Take 1 tablet (112 mcg total) by mouth daily before breakfast. 02/16/20  Yes Copland, Gay Filler, MD  Omega-3 Fatty Acids (FISH OIL) 1200 MG CAPS Take 1,200 mg by mouth daily.   Yes [provider]  omeprazole (PRILOSEC) 40 MG capsule TAKE 1 CAPSULE BY MOUTH EVERY DAY Patient taking differently: Take 40 mg by mouth daily. 02/15/20  Yes Copland, Gay Filler, MD  potassium chloride (KLOR-CON) 10 MEQ tablet Take 10 mEq by mouth every other day. 04/02/20  Yes [provider]  predniSONE (DELTASONE) 5 MG tablet TAKE 1 TABLET BY MOUTH EVERY DAY WITH BREAKFAST Patient taking differently: Take 10 mg by mouth every morning. 11/06/20  Yes Mannam, Praveen, MD  rosuvastatin (CRESTOR) 20 MG tablet TAKE 1 TABLET BY MOUTH EVERY DAY Patient taking differently: Take 20 mg by mouth every evening. 05/15/20  Yes Mosie Lukes, MD  Selexipag (UPTRAVI) 1400 MCG TABS Take 1,400  mcg by mouth 2 (two) times daily. 12/03/20  Yes [provider]  tadalafil, PAH, (ADCIRCA) 20 MG tablet TAKE 2 TABLETS BY MOUTH EVERY DAY Patient taking differently: Take 40 mg by mouth every morning. 10/16/20  Yes Larey Dresser, MD  traMADol (ULTRAM) 50 MG tablet Take 2 tablets (100 mg total) by mouth every 6 (six) hours as needed for moderate pain or severe pain. 06/17/18  Yes Juanito Doom, MD  vitamin B-12 (CYANOCOBALAMIN) 1000 MCG tablet Take 1,000 mcg by mouth daily.   Yes [provider]  Selexipag (UPTRAVI) 1200 MCG TABS Take 2,400 mcg by mouth 2 (two) times daily. Needs appt for further refills Patient not taking: No sig reported 10/23/20   Larey Dresser, MD  Spacer/Aero-Holding Chambers (AEROCHAMBER PLUS WITH MASK) inhaler Use as instructed 01/28/18   Debbe Odea, MD     Critical care time: n/a      Eliseo Gum MSN, AGACNP-BC [place] for pager details  01/04/2021, 5:42 PM

## 2021-01-04 NOTE — Progress Notes (Addendum)
PROGRESS NOTE    Kimberly Irwin  OEV:[id number] DOB: 01-20-1946 DOA: 01/02/2021 PCP: Mosie Lukes, MD    Brief Narrative:  75 y.o. female with medical history significant for COPD, sarcoidosis, HTN, HLD, HFpEF, cardiomegaly, chronic respiratory failure with hypoxia on oxygen at 7-8 L/min by [facility] at home who presents for evaluation of worsening SOB. She presented by EMS on NRB and was started on Bipap in the ER. She has now been weaned to [facility]   Assessment & Plan:   Principal Problem:   Acute on chronic respiratory failure with hypoxia (HCC) Active Problems:   Sarcoidosis stage I   Hyperlipidemia   Essential hypertension   Hypothyroidism   Cardiomegaly   Pulmonary HTN (HCC)   Chronic heart failure with preserved ejection fraction (HFpEF) (HCC)   COPD with acute exacerbation (HCC)   Prolonged QT interval  Principal Problem:   Acute on chronic respiratory failure with hypoxia  -Baseline O2 is around 8L, currently remains on 20L  -Presenting CT chest reviewed. Findings neg for PE, but with chronic emphysema with stable nodules, cardiomegaly -Pt is continued on schedule bronchodilators -Pt initially required Bipap in the ER but has been weaned to [facility] [place] records reviewed. Pt is followed by Dr. Vaughan Browner. Initial concerns for sarcoid, however further w/u determined no active disease.Pt remains on prednisone PTA, currently on IV solumedrol -Outpt high res CT on 3/22 was concerning for fibrotic changes categorized as indeterminate for usual  -As pt remains on high flow, have consulted Pulmonary to assist with O2 wean  Active Problems:   COPD with acute exacerbation  -Continued on scheduled bronchodilator -Remains on 58m Q8hrs solumedrol  Patient is currently on high flow nasal cannula and maintaining O2 saturation.   -She normally uses oxygen at 7 to 8 L/min  -Cont to wean O2 as tolerated here    Essential hypertension -Continue home dose of atenolol twice a day -BP stable  at present.    Cardiomegaly -Chronic.  Was September 2020 which showed LVH with EF of 60 to 65% with abnormal diastolic function -2d echo reviewed, EF 60-65% noted    Pulmonary HTN  -Continue tadalafil as pt tolerates    Chronic heart failure with preserved ejection fraction (HFpEF)  -No signs of fluid overload at this time.   -Patient does have cardiomegaly on chest x-ray. -2d echo with preserved EF noted    Sarcoidosis stage I -Pt had been on chronic 564mprednisone, per above -Currently on IV solumedrol per above -See above. No active sarcoid disease noted on recent CT imaging -Cont to f/u with Pulmonary    Hyperlipidemia -Continue home dose of Crestor.   -Continue coenzyme Q 10 that she takes at home    Hypothyroidism -Continue home dose of levothyroxine as tolerated    Prolonged QT interval -Continuing to monitor on tele  T12 Compression fracture -Noted on imaging, following falls at home -Pt appears comfortable -Cont PT as tolerated. Recs for SNF noted  DVT prophylaxis: Lovenox subq Code Status: DNR Family Communication: Pt in room, family not at bedside  Status is: Inpatient  Remains inpatient appropriate because:Inpatient level of care appropriate due to severity of illness   Dispo: The patient is from: Home              Anticipated d/c is to: Home              Patient currently is not medically stable to d/c.   Difficult to place patient No  Consultants:     Procedures:     Antimicrobials: Anti-infectives (From admission, onward)   None      Subjective: Without complaints this AM. Denies feeling sob  Objective: Vitals:   01/04/21 0555 01/04/21 0804 01/04/21 0915 01/04/21 1436  BP: 130/76  130/80   Pulse: 60  62   Resp: (!) 24     Temp: 97.6 F (36.4 C)     TempSrc: Oral     SpO2: 96% 94%  93%  Weight:      Height:        Intake/Output Summary (Last 24 hours) at 01/04/2021 1604 Last data filed at 01/04/2021 1200 Gross per  24 hour  Intake --  Output 1000 ml  Net -1000 ml   Filed Weights   01/02/21 1456 01/04/21 0402  Weight: 74.4 kg 74.4 kg    Examination: General exam: Conversant, in no acute distress Respiratory system: normal chest rise, clear, decreased BS Cardiovascular system: regular rhythm, s1-s2 Gastrointestinal system: Nondistended, nontender, pos BS Central nervous system: No seizures, no tremors Extremities: No cyanosis, no joint deformities Skin: No rashes, no pallor Psychiatry: Affect normal // no auditory hallucinations   Data Reviewed: I have personally reviewed following labs and imaging studies  CBC: Recent Labs  Lab 01/02/21 1453 01/03/21 0300 01/04/21 0052  WBC 13.2* 8.9 11.8*  NEUTROABS 10.9*  --   --   HGB 14.7 13.5 12.8  HCT 44.1 40.7 38.0  MCV 90.9 88.7 90.7  PLT 180 156 833   Basic Metabolic Panel: Recent Labs  Lab 01/02/21 1453 01/03/21 0300 01/04/21 0052  NA 136 135 138  K 4.2 3.4* 5.0  CL 98 94* 103  CO2 _0 GLUCOSE 147* 214* 134*  BUN 20 22 24*  CREATININE 1.18* 1.20* 1.11*  CALCIUM 9.3 9.4 9.3   GFR: Estimated Creatinine Clearance: 42.6 mL/min (A) (by C-G formula based on SCr of 1.11 mg/dL (H)). Liver Function Tests: Recent Labs  Lab 01/02/21 1453 01/04/21 0052  AST 30 16  ALT 20 15  ALKPHOS 42 35*  BILITOT 1.6* 0.7  PROT 7.2 6.0*  ALBUMIN 3.9 3.3*   No results for input(s): LIPASE, AMYLASE in the last 168 hours. No results for input(s): AMMONIA in the last 168 hours. Coagulation Profile: No results for input(s): INR, PROTIME in the last 168 hours. Cardiac Enzymes: No results for input(s): CKTOTAL, CKMB, CKMBINDEX, TROPONINI in the last 168 hours. BNP (last 3 results) No results for input(s): PROBNP in the last 8760 hours. HbA1C: No results for input(s): HGBA1C in the last 72 hours. CBG: No results for input(s): GLUCAP in the last 168 hours. Lipid Profile: No results for input(s): CHOL, HDL, LDLCALC, TRIG, CHOLHDL,  LDLDIRECT in the last 72 hours. Thyroid Function Tests: No results for input(s): TSH, T4TOTAL, FREET4, T3FREE, THYROIDAB in the last 72 hours. Anemia Panel: No results for input(s): VITAMINB12, FOLATE, FERRITIN, TIBC, IRON, RETICCTPCT in the last 72 hours. Sepsis Labs: No results for input(s): PROCALCITON, LATICACIDVEN in the last 168 hours.  Recent Results (from the past 240 hour(s))  Resp Panel by RT-PCR (Flu A&B, Covid) Nasopharyngeal Swab     Status: None   Collection Time: 01/02/21  3:55 PM   Specimen: Nasopharyngeal Swab; Nasopharyngeal(NP) swabs in vial transport medium  Result Value Ref Range Status   SARS Coronavirus 2 by RT PCR NEGATIVE NEGATIVE Final    Comment: (NOTE) SARS-CoV-2 target nucleic acids are NOT DETECTED.  The SARS-CoV-2 RNA is generally detectable in  upper respiratory specimens during the acute phase of infection. The lowest concentration of SARS-CoV-2 viral copies this assay can detect is 138 copies/mL. A negative result does not preclude SARS-Cov-2 infection and should not be used as the sole basis for treatment or other patient management decisions. A negative result may occur with  improper specimen collection/handling, submission of specimen other than nasopharyngeal swab, presence of viral mutation(s) within the areas targeted by this assay, and inadequate number of viral copies(<138 copies/mL). A negative result must be combined with clinical observations, patient history, and epidemiological information. The expected result is Negative.  Fact Sheet for Patients:  EntrepreneurPulse.com.au  Fact Sheet for Healthcare Providers:  IncredibleEmployment.be  This test is no t yet approved or cleared by the [place] FDA and  has been authorized for detection and/or diagnosis of SARS-CoV-2 by FDA under an Emergency Use Authorization (EUA). This EUA will remain  in effect (meaning this test can be used) for the  duration of the COVID-19 declaration under Section 564(b)(1) of the Act, 21 U.S.C.section 360bbb-3(b)(1), unless the authorization is terminated  or revoked sooner.       Influenza A by PCR NEGATIVE NEGATIVE Final   Influenza B by PCR NEGATIVE NEGATIVE Final    Comment: (NOTE) The Xpert Xpress SARS-CoV-2/FLU/RSV plus assay is intended as an aid in the diagnosis of influenza from Nasopharyngeal swab specimens and should not be used as a sole basis for treatment. Nasal washings and aspirates are unacceptable for Xpert Xpress SARS-CoV-2/FLU/RSV testing.  Fact Sheet for Patients: EntrepreneurPulse.com.au  Fact Sheet for Healthcare Providers: IncredibleEmployment.be  This test is not yet approved or cleared by the [place] FDA and has been authorized for detection and/or diagnosis of SARS-CoV-2 by FDA under an Emergency Use Authorization (EUA). This EUA will remain in effect (meaning this test can be used) for the duration of the COVID-19 declaration under Section 564(b)(1) of the Act, 21 U.S.C. section 360bbb-3(b)(1), unless the authorization is terminated or revoked.  Performed at [facility], [place] [address] [place], [place] [address]      Radiology Studies: DG Thoracic Spine 2 View  Result Date: 01/03/2021 CLINICAL DATA:  75 year old female with back pain. EXAM: THORACIC SPINE 2 VIEWS COMPARISON:  CTA chest 0358 hours today. FINDINGS: Normal thoracic segmentation. Mild T3 superior endplate compression with superior endplate sclerosis better demonstrated by CT this morning, and stable since 10/08/2020. Similar T1 inferior endplate compression also better demonstrated by CT and stable. On the lateral images today there is subtle compression of the T12 inferior endplate when compared to radiographs in 2019, and also a chest CT 09/20/2019. This level was not included on the CT earlier today. Other thoracic vertebrae appear intact.  Partially visible lumbar spine hardware, abdominal Calcified aortic atherosclerosis. IMPRESSION: 1. Mild T12 inferior endplate compression fracture, new since last year but age indeterminate. If specific therapy such as vertebroplasty is desired, Lumbar MRI or Nuclear Medicine Whole-body Bone Scan would best determine acuity. 2. No other acute osseous abnormality identified in the thoracic spine, with the levels above T12 best demonstrated on the CTA chest earlier today (please see that report) . Electronically Signed   By: Genevie Ann M.D.   On: 01/03/2021 07:38   DG Lumbar Spine 2-3 Views  Result Date: 01/03/2021 CLINICAL DATA:  Back pain. EXAM: LUMBAR SPINE - 2-3 VIEW COMPARISON:  Lumbar spine series 12/06/2014 FINDINGS: Lumbar spine numbered the lowest segmented appearing lumbar shaped vertebrae on lateral view as L5. L2-L3 left lateral  fusion. L3 through L5 posterior interbody fusion. Prior vertebroplasty L2. Similar findings noted on prior exam. Hardware intact. Stable alignment. Stable mild anterolisthesis L4 on L5. Diffuse osteopenia and degenerative change. No acute bony abnormality. Total right hip replacement. Contrast from prior CT noted the renal collecting systems. Left renal stone cannot be excluded. Aortoiliac atherosclerotic vascular calcification. IMPRESSION: 1. Stable postsurgical changes lumbar spine. Hardware intact. Stable alignment. Stable mild anterolisthesis L4 on L5. Diffuse osteopenia and degenerative change. No acute bony abnormality identified. 2.  Left renal stone cannot be excluded. 3.  Aortoiliac atherosclerotic vascular disease. Electronically Signed   By: Marcello Moores  Register   On: 01/03/2021 07:36   CT ANGIO CHEST PE W OR WO CONTRAST  Result Date: 01/03/2021 CLINICAL DATA:  75 year old female with sarcoidosis. On home oxygen. Shortness of breath. EXAM: CT ANGIOGRAPHY CHEST WITH CONTRAST TECHNIQUE: Multidetector CT imaging of the chest was performed using the standard protocol during  bolus administration of intravenous contrast. Multiplanar CT image reconstructions and MIPs were obtained to evaluate the vascular anatomy. CONTRAST:  59m OMNIPAQUE IOHEXOL 350 MG/ML SOLN COMPARISON:  Portable chest 01/02/2021. High-resolution chest CT 10/08/2020. CTA chest 10/17/2019. FINDINGS: Cardiovascular: Excellent contrast bolus timing in the pulmonary arterial tree. Mild respiratory motion at the lung bases. No focal filling defect identified in the pulmonary arteries to suggest acute pulmonary embolism. A degree of chronic central pulmonary artery enlargement is stable since 2021. Tortuous thoracic aorta with calcified atherosclerosis. Tortuous proximal great vessels. Mild to moderate cardiomegaly. No pericardial effusion. Calcified coronary artery atherosclerosis is extensive. Mediastinum/Nodes: Fairly small mediastinal lymph nodes are stable since last year. Lungs/Pleura: Major airways are stable and patent. Lower lung volumes, with increased lung base atelectasis compared to March this year. Centrilobular emphysema. Stable small right lung nodule on series 7, 2 small right lung nodules remain stable and are benign by criteria. No pleural effusion or other acute pulmonary opacity. Upper Abdomen: Stable and negative visible liver, spleen, stomach, left adrenal gland. Musculoskeletal: Osteopenia. Mild T3 superior endplate compression fracture is new from last year but stable since March. Similar stable since March mild compression of the inferior T1 endplate. No acute osseous abnormality identified. Review of the MIP images confirms the above findings. IMPRESSION: 1. Negative for acute pulmonary embolus. 2. Chronic Emphysema (ICD10-J43.9) with occasional stable and benign pulmonary nodules. Lower lung volumes today with mild atelectasis. 3. Cardiomegaly. Calcified coronary artery and Aortic Atherosclerosis (ICD10-I70.0), tortuosity. 4. Mild T1 and T3 compression fractures are new since 2021 but stable  since March. Electronically Signed   By: HGenevie AnnM.D.   On: 01/03/2021 04:21   ECHOCARDIOGRAM COMPLETE  Result Date: 01/03/2021    ECHOCARDIOGRAM REPORT   Patient Name:   Kimberly BABICHDate of Exam: 01/03/2021 Medical Rec #:  [id number]    Height:       64.0 in Accession #:    [id number]   Weight:       164.0 lb Date of Birth:  916-Feb-1947     BSA:          1.798 m Patient Age:    89years      BP:           110/83 mmHg Patient Gender: F             HR:           63 bpm. Exam Location:  Inpatient Procedure: 2D Echo, Cardiac Doppler and Color Doppler Indications:    CHF  History:        Patient has prior history of Echocardiogram examinations, most                 recent 04/28/2019. COPD; Risk Factors:Hypertension, Dyslipidemia                 and Former Smoker.  Sonographer:    Cammy Brochure Referring Phys: [id number] Eben Burow  Sonographer Comments: Image acquisition challenging due to patient body habitus, Image acquisition challenging due to respiratory motion and Image acquisition challenging due to COPD. IMPRESSIONS  1. Left ventricular ejection fraction, by estimation, is 60 to 65%. The left ventricle has normal function. The left ventricle has no regional wall motion abnormalities. There is mild concentric left ventricular hypertrophy. Left ventricular diastolic parameters are consistent with Grade I diastolic dysfunction (impaired relaxation).  2. Right ventricular systolic function is normal. The right ventricular size is moderately enlarged. There is moderately elevated pulmonary artery systolic pressure. The estimated right ventricular systolic pressure is 78.4 mmHg.  3. The mitral valve is normal in structure. No evidence of mitral valve regurgitation. No evidence of mitral stenosis.  4. The aortic valve is tricuspid. Aortic valve regurgitation is not visualized. Mild to moderate aortic valve sclerosis/calcification is present, without any evidence of aortic stenosis. Aortic valve area, by VTI  measures 2.25 cm. Aortic valve mean gradient measures 5.0 mmHg. Aortic valve Vmax measures 1.58 m/s.  5. The inferior vena cava is normal in size with greater than 50% respiratory variability, suggesting right atrial pressure of 3 mmHg. FINDINGS  Left Ventricle: Left ventricular ejection fraction, by estimation, is 60 to 65%. The left ventricle has normal function. The left ventricle has no regional wall motion abnormalities. The left ventricular internal cavity size was normal in size. There is  mild concentric left ventricular hypertrophy. Left ventricular diastolic parameters are consistent with Grade I diastolic dysfunction (impaired relaxation). Normal left ventricular filling pressure. Right Ventricle: The right ventricular size is moderately enlarged. No increase in right ventricular wall thickness. Right ventricular systolic function is normal. There is moderately elevated pulmonary artery systolic pressure. The tricuspid regurgitant  velocity is 3.47 m/s, and with an assumed right atrial pressure of 3 mmHg, the estimated right ventricular systolic pressure is 69.6 mmHg. Left Atrium: Left atrial size was normal in size. Right Atrium: Right atrial size was normal in size. Pericardium: There is no evidence of pericardial effusion. Mitral Valve: The mitral valve is normal in structure. No evidence of mitral valve regurgitation. No evidence of mitral valve stenosis. Tricuspid Valve: The tricuspid valve is normal in structure. Tricuspid valve regurgitation is mild . No evidence of tricuspid stenosis. Aortic Valve: The aortic valve is tricuspid. Aortic valve regurgitation is not visualized. Mild to moderate aortic valve sclerosis/calcification is present, without any evidence of aortic stenosis. Aortic valve mean gradient measures 5.0 mmHg. Aortic valve peak gradient measures 10.0 mmHg. Aortic valve area, by VTI measures 2.25 cm. Pulmonic Valve: The pulmonic valve was normal in structure. Pulmonic valve  regurgitation is not visualized. No evidence of pulmonic stenosis. Aorta: The aortic root is normal in size and structure. Venous: The inferior vena cava is normal in size with greater than 50% respiratory variability, suggesting right atrial pressure of 3 mmHg. IAS/Shunts: No atrial level shunt detected by color flow Doppler.  LEFT VENTRICLE PLAX 2D LVIDd:         4.90 cm  Diastology LVIDs:         3.50 cm  LV  e' medial:   4.68 cm/s LV PW:         1.10 cm  LV E/e' medial: 9.1 LV IVS:        1.20 cm LVOT diam:     2.00 cm LV SV:         67 LV SV Index:   37 LVOT Area:     3.14 cm  RIGHT VENTRICLE             IVC RV Basal diam:  4.70 cm     IVC diam: 1.70 cm RV S prime:     11.20 cm/s LEFT ATRIUM             Index       RIGHT ATRIUM           Index LA diam:        4.20 cm 2.34 cm/m  RA Area:     18.30 cm LA Vol (A2C):   39.6 ml 22.02 ml/m RA Volume:   44.90 ml  24.97 ml/m LA Vol (A4C):   42.5 ml 23.63 ml/m LA Biplane Vol: 41.6 ml 23.13 ml/m  AORTIC VALVE AV Area (Vmax):    2.05 cm AV Area (Vmean):   2.00 cm AV Area (VTI):     2.25 cm AV Vmax:           158.00 cm/s AV Vmean:          104.000 cm/s AV VTI:            0.298 m AV Peak Grad:      10.0 mmHg AV Mean Grad:      5.0 mmHg LVOT Vmax:         103.00 cm/s LVOT Vmean:        66.100 cm/s LVOT VTI:          0.213 m LVOT/AV VTI ratio: 0.71  AORTA Ao Root diam: 3.30 cm Ao Asc diam:  3.60 cm MITRAL VALVE               TRICUSPID VALVE MV Area (PHT): 2.73 cm    TR Peak grad:   48.2 mmHg MV Decel Time: 278 msec    TR Vmax:        347.00 cm/s MV E velocity: 42.80 cm/s MV A velocity: 97.30 cm/s  SHUNTS MV E/A ratio:  0.44        Systemic VTI:  0.21 m                            Systemic Diam: 2.00 cm Fransico Him MD Electronically signed by Fransico Him MD Signature Date/Time: 01/03/2021/4:48:06 PM    Final     Scheduled Meds: . aspirin EC  81 mg Oral QHS  . atenolol  25 mg Oral QHS  . atenolol  50 mg Oral Daily  . enoxaparin (LOVENOX) injection  40 mg  Subcutaneous Daily  . fluticasone furoate-vilanterol  1 puff Inhalation Daily   And  . umeclidinium bromide  1 puff Inhalation Daily  . furosemide  40 mg Oral Daily  . gabapentin  300 mg Oral QHS  . levothyroxine  112 mcg Oral QAC breakfast  . methylPREDNISolone (SOLU-MEDROL) injection  60 mg Intravenous Q8H  . omega-3 acid ethyl esters  1 g Oral Daily  . pantoprazole  40 mg Oral Daily  . potassium chloride  10 mEq Oral Daily  . rosuvastatin  20 mg Oral Daily  .  Selexipag  1,400 mcg Oral BID  . tadalafil (PAH)  40 mg Oral Daily   Continuous Infusions: . sodium chloride 50 mL/hr at 01/03/21 2137     LOS: 2 days   Marylu Lund, MD [facility] Pager On Amion  If 7PM-7AM, please contact night-coverage 01/04/2021, 4:04 PM

## 2021-01-04 NOTE — Evaluation (Signed)
Physical Therapy Evaluation Patient Details Name: Kimberly Irwin MRN: [id number] DOB: 27-Jun-1946 Today's Date: 01/04/2021   History of Present Illness  75 y.o. female with medical history significant for COPD, sarcoidosis, HTN, HLD, HFpEF, cardiomegaly, chronic respiratory failure with hypoxia on oxygen at 7-8 L/min by [place] at home who presents for evaluation of worsening SOB. She presented by EMS on NRB and was started on Bipap in the ER. She has now been weaned to HFNC.  Clinical Impression  Pt admitted with above diagnosis. Pt was able to pivot to recliner with mod assist however desat to 79% on heated HFNC and took 5 min for sats to return to >90%.  Pt very decompensated and will need short term Rehab to gain endurance most likely as daughter works and pt is alone during the day. Will progress pt as able. Pt currently with functional limitations due to the deficits listed below (see PT Problem List). Pt will benefit from skilled PT to increase their independence and safety with mobility to allow discharge to the venue listed below.      Follow Up Recommendations SNF    Equipment Recommendations  None recommended by PT    Recommendations for Other Services       Precautions / Restrictions Precautions Precautions: Fall Restrictions Weight Bearing Restrictions: No      Mobility  Bed Mobility Overal bed mobility: Independent             General bed mobility comments: incr time and incr work of breathing with sats to 89% just sitting up.    Transfers Overall transfer level: Needs assistance Equipment used: 2 person hand held assist Transfers: Sit to/from [facility] Sit to Stand: Mod assist Stand pivot transfers: Mod assist       General transfer comment: Needed assixt to power up and pivot to chair with pt slow moving but did stand upright with bil UE support. Desat to 79% and took incr time to return to >90% on 80% FiO2 heated high flow.  Ambulation/Gait                 Stairs            Wheelchair Mobility    Modified Rankin (Stroke Patients Only)       Balance Overall balance assessment: Needs assistance Sitting-balance support: No upper extremity supported;Feet supported Sitting balance-Leahy Scale: Fair     Standing balance support: Bilateral upper extremity supported;During functional activity Standing balance-Leahy Scale: Poor Standing balance comment: relies on UE support                             Pertinent Vitals/Pain Pain Assessment: Faces Faces Pain Scale: Hurts even more Pain Location: back Pain Descriptors / Indicators: Aching;Grimacing;Guarding Pain Intervention(s): Limited activity within patient's tolerance;Monitored during session;Repositioned    Home Living Family/patient expects to be discharged to:: Private residence Living Arrangements: Children (daughter lives with pt and works) Available Help at Discharge: Family;Available PRN/intermittently Type of Home: House Home Access: Ramped entrance     Home Layout: One level Home Equipment: Cane - single point;Walker - 2 wheels;Bedside commode;Shower seat;Grab bars - tub/shower;Walker - 4 wheels (8Lhome O2)      Prior Function Level of Independence: Independent with assistive device(s)         Comments: used cane at times, B/D self     Hand Dominance   Dominant Hand: Right    Extremity/Trunk Assessment  Upper Extremity Assessment Upper Extremity Assessment: Defer to OT evaluation    Lower Extremity Assessment Lower Extremity Assessment: Generalized weakness    Cervical / Trunk Assessment Cervical / Trunk Assessment: Kyphotic  Communication   Communication: No difficulties  Cognition Arousal/Alertness: Awake/alert Behavior During Therapy: WFL for tasks assessed/performed Overall Cognitive Status: Within Functional Limits for tasks assessed                                        General  Comments General comments (skin integrity, edema, etc.): 80% FiO2 Heated HFNC with resting sats 92%.  Educated in pursed lip breathing.    Exercises     Assessment/Plan    PT Assessment Patient needs continued PT services  PT Problem List Decreased activity tolerance;Decreased balance;Decreased mobility;Decreased knowledge of use of DME;Decreased safety awareness;Decreased knowledge of precautions;Cardiopulmonary status limiting activity       PT Treatment Interventions DME instruction;Gait training;Functional mobility training;Therapeutic activities;Therapeutic exercise;Balance training;Patient/family education    PT Goals (Current goals can be found in the Care Plan section)  Acute Rehab PT Goals Patient Stated Goal: to go home PT Goal Formulation: With patient Time For Goal Achievement: 01/18/21 Potential to Achieve Goals: Good    Frequency Min 3X/week   Barriers to discharge        Co-evaluation               AM-PAC PT "6 Clicks" Mobility  Outcome Measure Help needed turning from your back to your side while in a flat bed without using bedrails?: A Lot Help needed moving from lying on your back to sitting on the side of a flat bed without using bedrails?: A Lot Help needed moving to and from a bed to a chair (including a wheelchair)?: A Lot Help needed standing up from a chair using your arms (e.g., wheelchair or bedside chair)?: A Lot Help needed to walk in hospital room?: Total Help needed climbing 3-5 steps with a railing? : Total 6 Click Score: 10    End of Session Equipment Utilized During Treatment: Gait belt;Oxygen (Heated HFNC) Activity Tolerance: Patient limited by fatigue Patient left: in chair;with call bell/phone within reach Nurse Communication: Mobility status PT Visit Diagnosis: Unsteadiness on feet (R26.81);Muscle weakness (generalized) (M62.81)    Time: 1355-1420 PT Time Calculation (min) (ACUTE ONLY): 25 min   Charges:   PT  Evaluation $PT Eval Moderate Complexity: 1 Mod PT Treatments $Therapeutic Activity: 8-22 mins        [name] M,PT Acute Rehab Services [phone] (pager)  [name] 01/04/2021, 3:44 PM

## 2021-01-05 DIAGNOSIS — J9621 Acute and chronic respiratory failure with hypoxia: Secondary | ICD-10-CM | POA: Diagnosis not present

## 2021-01-05 DIAGNOSIS — I272 Pulmonary hypertension, unspecified: Secondary | ICD-10-CM

## 2021-01-05 LAB — BASIC METABOLIC PANEL
Anion gap: 12 (ref 5–15)
BUN: 31 mg/dL — ABNORMAL HIGH (ref 8–23)
CO2: 27 mmol/L (ref 22–32)
Calcium: 8.8 mg/dL — ABNORMAL LOW (ref 8.9–10.3)
Chloride: 98 mmol/L (ref 98–111)
Creatinine, Ser: 1.31 mg/dL — ABNORMAL HIGH (ref 0.44–1.00)
GFR, Estimated: 42 mL/min — ABNORMAL LOW (ref >60)
Glucose, Bld: 151 mg/dL — ABNORMAL HIGH (ref 70–99)
Potassium: 3.3 mmol/L — ABNORMAL LOW (ref 3.5–5.1)
Sodium: 137 mmol/L (ref 135–145)

## 2021-01-05 LAB — BRAIN NATRIURETIC PEPTIDE: B Natriuretic Peptide: 513.1 pg/mL — ABNORMAL HIGH (ref 0.0–100.0)

## 2021-01-05 MED ORDER — POTASSIUM CHLORIDE CRYS ER 20 MEQ PO TBCR
40.0000 meq | EXTENDED_RELEASE_TABLET | Freq: Once | ORAL | Status: AC
  Administered 2021-01-05: 40 meq via ORAL
  Filled 2021-01-05: qty 2

## 2021-01-05 MED ORDER — POTASSIUM CHLORIDE CRYS ER 10 MEQ PO TBCR
10.0000 meq | EXTENDED_RELEASE_TABLET | Freq: Every day | ORAL | Status: DC
  Administered 2021-01-06 – 2021-01-14 (×9): 10 meq via ORAL
  Filled 2021-01-05 (×9): qty 1

## 2021-01-05 NOTE — Progress Notes (Signed)
Patient tolerating HFNC well. BIPAP not indicated at this time. RT will monitor as needed.

## 2021-01-05 NOTE — Progress Notes (Signed)
NAME:  Kimberly Irwin, MRN:  [id number], DOB:  [birth year], LOS: 3 ADMISSION DATE:  01/02/2021, CONSULTATION DATE:  01/04/2021 REFERRING MD:  Maylene Roes - TRH , CHIEF COMPLAINT:  SOB, hypoxia   History of Present Illness:  75 yo F PMH sarcoidosis (no active disease but remains on steroids for SOB), Emphysema, HFpEF , pulmonary HTN, Chronic hypoxic respiratory failure on 8LNC admitted to [facility] 6/1 with hypoxia after a fall from chair to knees, after which EMS was dispatched and brought pt to hospital due to hypoxia. Pt does endorse increasing SOB in the evening for about a week ever since her last Uptravi decrease. Also endorses an intermittent cough associated  with white mucus. Denied fever, chest pain, palpitations, sick contacts, new environmental exposures/allergens. CTA chest without evidence of PE. Exam findings most consistent with AECOPD, was started on solumedrol 6/2 and continued on BDs. COVID is negative, no RVP sent, not on abx. Since admission 6/1, O2 requirements have de-escalated from NRB , BiPAP to HFNC but have remained significantly higher than baseline. Currently on HFNC 80% FiO2 20 L/min PCCM consulted 6/3 for persistent hypoxia, inability to wean O2.     Of note, recent outpatient HRCT 10/08/20 with some fibrotic changes, paraseptal and centrilobular emphysema, diffuse bronchial wall thickening  In review of documentation from [facility] (where pt follows for pulmonary HTN) it looks like Uptravi was recently decreased from 2400 to 1700 to 1400 (last decrease approx 1week PTA)  Pertinent  Medical History  Sarcodisis  Pulmonary HTN HFpEF Emphysema  Chronic hypoxic respiratory failure  HTN HLD DDD S/p L3-4, 4-5posterior laminectomy, fusion, fixation GERD Hiatal hernia Chronic tremor  Hypothyroidism Significant Hospital Events: Including procedures, antibiotic start and stop dates in addition to other pertinent events   . 6/1 admitted to [facility] for acute on chronic hypoxia  . 6/3 still on  HFNC, PCCM consulted for inability to wean O2  . 6/4 25L, 70% FiO2  Interim History / Subjective:  She feels about the same, breathing not better.  Objective   Blood pressure 109/73, pulse (!) 54, temperature 98.5 F (36.9 C), temperature source Oral, resp. rate 20, height _0  (1.626 m), weight 75.1 kg, SpO2 95 %.    FiO2 (%):  [70 %-80 %] 70 %   Intake/Output Summary (Last 24 hours) at 01/05/2021 1438 Last data filed at 01/05/2021 0700 Gross per 24 hour  Intake --  Output 1100 ml  Net -1100 ml   Filed Weights   01/02/21 1456 01/04/21 0402 01/05/21 0500  Weight: 74.4 kg 74.4 kg 75.1 kg    Examination: General: frail, chronically ill appearing woman lying in bed eating lunch HENT: [place]/AT, eyes anicteric Neck: JVD improved, well healed scar at base of the neck Lungs: mild tachypnea, breathing comfortably on [place] without accessory muscle use Cardiovascular: S1S2, RRR Abdomen: soft, NT, ND  Extremities: +clubbed, no cyanosis, no ankle edema Neuro: head bobbing tremor, answering questions appropriately, globally weak Derm: pallor, no rashes  Labs/imaging that I havepersonally reviewed  (right click and "Reselect all SmartList Selections" daily)  HRCT 10/08/20 with some fibrotic changes, paraseptal and centrilobular emphysema, diffuse bronchial wall thickening   Out pt autoimmune/rheum labs -- unremarkable except ANA positive, speckled 1:40  CTA chest 6/2> no PE. Tortuous vessels. Patent major airways, low lung volumes. Centrolobular emphysema. Stable nodules.   6/1 BNP 450 >> 513 on 6/4  BUN 31 Cr 1.31  Per [facility] records, prev PH workup: RHC:  11/2017: RA 2, RV 66/5, PA 66/16,  mPAP 34, PAWP 3, PA saturation 60% AO 92% Cardiac Output (Fick) 3.73, Cardiac Index (Fick) 2.04, PVR 8.3 WU   05/2019: (mPAP 28 mmHg and PVR 2.75 WU)  V/Q scan: 12/2017: Normal perfusion lung scan. Minimally diminished ventilation to the RIGHT apex.  6MWT 05/17/19: ambulated 122 meters, required 6 L of  supplemental O2    Resolved Hospital Problem list     Assessment & Plan:   Acute on chronic hypoxic respiratory failure; suspect this is due to worsened PH as her pulm vasodilators have been recently decreased due to concern that their up-titration in the fall of 2020 made her worse. No recent URI symptoms to suggest infection, no significant GG to raise concern for viral or atypical pneumonia. -at home, on 6-10L. 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 Our pharmacy does not stock this and it can only be obtained from specialty outpatient pharmacies.  -She was recently given prednisone taper- 51m x 2 weeks, then 142mx 1 week, 1033maily -prev intolerant to macitentan -on tadalafil 60m84mily -BNP on admission 500 -6/3 BNP ordered, pending   Plan: -Con't triple inhaled therapy -Ok to continue high dose solumedrol, but she has failed to improve with this so far this admission, making this less likely to be a steroids responsive disease acutely. She takes chronic predisone 5mg>70mcently had been 10mg 74my with a taper down from 20mg i88mrly May.  -con't tadalafil at PTA dose -would like to increase uptravi back to her previous dose, but this is not feasible without getting a new script to a specialty pharmacy, which would have to be done during the week and her family would have to bring it to the hospital. -Re-check BNP in 2 days; con't diuresis and monitoring renal function  -IS, mobility -- will be  very important for this patient, need to get PT and really push mobility while inpatient given her baseline limited mobility  -Wean O2 as able -May need repeat RHC this week to clarify status of her PH; wilUnderwood-Petersvillediscuss with her cardiologist.   We will continue to follow.  Best practice (right click and "Reselect all SmartList Selections" daily)  Diet:  Oral Pain/Anxiety/Delirium protocol (if indicated): No VAP protocol (if indicated): Not indicated DVT prophylaxis: LMWH GI prophylaxis: PPI Glucose control:  SSI No Central venous access:  N/A Arterial line:  N/A Foley:  N/A Mobility:  OOB  PT consulted: Yes Last date of multidisciplinary goals of care discussion [per primary] Code Status:  DNR Disposition: med tele   Labs   CBC: Recent Labs  Lab 01/02/21 1453 01/03/21 0300 01/04/21 0052  WBC 13.2* 8.9 11.8*  NEUTROABS 10.9*  --   --   HGB 14.7 13.5 12.8  HCT 44.1 40.7 38.0  MCV 90.9 88.7 90.7  PLT 180 156 158    643ic Metabolic Panel: Recent Labs  Lab 01/02/21 1453 01/03/21 0300 01/04/21 0052 01/05/21 0158  NA 136 135 138 137  K 4.2 3.4* 5.0 3.3*  CL 98 94* 103 98  CO2 _0 GLUCOSE 147* 214* 134* 151*  BUN 20 22 24* 31*  CREATININE 1.18* 1.20* 1.11* 1.31*  CALCIUM 9.3 9.4 9.3 8.8*   GFR: Estimated Creatinine Clearance: 36.3 mL/min (A) (by C-G formula based on SCr of 1.31 mg/dL (H)). Recent Labs  Lab 01/02/21 1453 01/03/21 0300 01/04/21 0052  WBC 13.2* 8.9 11.8Julian Hy, DO 01/05/21 3:08 PM [facility]

## 2021-01-05 NOTE — Progress Notes (Signed)
PROGRESS NOTE    Kimberly Irwin  WFU:[id number] DOB: 02-20-1946 DOA: 01/02/2021 PCP: Mosie Lukes, MD    Brief Narrative:  75 y.o. female with medical history significant for COPD, sarcoidosis, HTN, HLD, HFpEF, cardiomegaly, chronic respiratory failure with hypoxia on oxygen at 7-8 L/min by [place] at home who presents for evaluation of worsening SOB. She presented by EMS on NRB and was started on Bipap in the ER. She has now been weaned to [facility]   Assessment & Plan:   Principal Problem:   Acute on chronic respiratory failure with hypoxia (HCC) Active Problems:   Sarcoidosis stage I   Hyperlipidemia   Essential hypertension   Hypothyroidism   Cardiomegaly   Pulmonary HTN (HCC)   Chronic heart failure with preserved ejection fraction (HFpEF) (HCC)   COPD with acute exacerbation (HCC)   Prolonged QT interval  Principal Problem:   Acute on chronic respiratory failure with hypoxia  -Baseline O2 is around 8L, currently remains on 20L  -Presenting CT chest reviewed. Findings neg for PE, but with chronic emphysema with stable nodules, cardiomegaly -Pt is continued on schedule bronchodilators -Pt initially required Bipap in the ER but has been weaned to [place] records reviewed. Pt is followed by Dr. Vaughan Browner. Initial concerns for sarcoid, however further w/u determined no active disease.Pt remains on prednisone PTA, currently on IV solumedrol -Outpt high res CT on 3/22 was concerning for fibrotic changes categorized as indeterminate for usual  -Appreciate input by Pulmonary input. May likely wean steroids soon as acute disease process is likely not steroid responsive -Continue diuresis as tolerated, may repeat BNP in 2 days -Also may need repeat RHC this week to clarify status of PH -recheck bmet in AM  Active Problems:   COPD with acute exacerbation  -Continued on scheduled bronchodilator -Currently continued on 54m Q8hrs solumedrol, likely begin to taper down soon per  above  Patient is currently on high flow nasal cannula and maintaining O2 saturation.   -She normally uses oxygen at 7 to 8 L/min, needing up to 20L high flow -Cont to wean O2 as tolerated here    Essential hypertension -Continue home dose of atenolol twice a day -BP stable at present.    Cardiomegaly -Chronic.  Was September 2020 which showed LVH with EF of 60 to 65% with abnormal diastolic function -2d echo reviewed, EF 60-65% noted -Per pulmonary, consideration for RHC in the near future    Pulmonary HTN  -Continue tadalafil as pt tolerates -Likely primary source of presenting hypoxemic failure -Pt recently decreased dose of Uptravi. Ideally would like to resume higher dose, however current regimen and availability of med makes this difficult    Chronic heart failure with preserved ejection fraction (HFpEF)  -No signs of fluid overload at this time.   -Patient does have cardiomegaly on chest x-ray. -2d echo with preserved EF noted    Sarcoidosis stage I -Pt had been on chronic 535mprednisone, per above -Currently on IV solumedrol per above -See above. No active sarcoid disease noted on recent CT imaging -Appreciate input by PCCM    Hyperlipidemia -Continue home dose of Crestor.   -Continue coenzyme Q 10 that she takes at home    Hypothyroidism -Continue home dose of levothyroxine as tolerated    Prolonged QT interval -Continuing to monitor on tele  T12 Compression fracture -Noted on imaging, following falls at home -Pt appears comfortable -Cont PT as tolerated. Recs for SNF noted. Will consult TOC  DVT prophylaxis: Lovenox  subq Code Status: DNR Family Communication: Pt in room, family not at bedside  Status is: Inpatient  Remains inpatient appropriate because:Inpatient level of care appropriate due to severity of illness   Dispo: The patient is from: Home              Anticipated d/c is to: Home              Patient currently is not medically  stable to d/c.   Difficult to place patient No   Consultants:     Procedures:     Antimicrobials: Anti-infectives (From admission, onward)   None      Subjective: Reports breathing slightly better  Objective: Vitals:   01/05/21 0821 01/05/21 0843 01/05/21 0925 01/05/21 1221  BP:  (!) 147/96  109/73  Pulse:   63 (!) 54  Resp:  15  20  Temp:  98.5 F (36.9 C)  98.5 F (36.9 C)  TempSrc:  Oral  Oral  SpO2: 98% 98%  95%  Weight:      Height:        Intake/Output Summary (Last 24 hours) at 01/05/2021 1657 Last data filed at 01/05/2021 0700 Gross per 24 hour  Intake --  Output 1100 ml  Net -1100 ml   Filed Weights   01/02/21 1456 01/04/21 0402 01/05/21 0500  Weight: 74.4 kg 74.4 kg 75.1 kg    Examination: General exam: Awake, laying in bed, in nad Respiratory system: Normal respiratory effort, no wheezing, [place] in place Cardiovascular system: regular rate, s1, s2 Gastrointestinal system: Soft, nondistended, positive BS Central nervous system: CN2-12 grossly intact, strength intact Extremities: Perfused, no clubbing Skin: Normal skin turgor, no notable skin lesions seen Psychiatry: Mood normal // no visual hallucinations   Data Reviewed: I have personally reviewed following labs and imaging studies  CBC: Recent Labs  Lab 01/02/21 1453 01/03/21 0300 01/04/21 0052  WBC 13.2* 8.9 11.8*  NEUTROABS 10.9*  --   --   HGB 14.7 13.5 12.8  HCT 44.1 40.7 38.0  MCV 90.9 88.7 90.7  PLT 180 156 573   Basic Metabolic Panel: Recent Labs  Lab 01/02/21 1453 01/03/21 0300 01/04/21 0052 01/05/21 0158  NA 136 135 138 137  K 4.2 3.4* 5.0 3.3*  CL 98 94* 103 98  CO2 _0 GLUCOSE 147* 214* 134* 151*  BUN 20 22 24* 31*  CREATININE 1.18* 1.20* 1.11* 1.31*  CALCIUM 9.3 9.4 9.3 8.8*   GFR: Estimated Creatinine Clearance: 36.3 mL/min (A) (by C-G formula based on SCr of 1.31 mg/dL (H)). Liver Function Tests: Recent Labs  Lab 01/02/21 1453 01/04/21 0052  AST  30 16  ALT 20 15  ALKPHOS 42 35*  BILITOT 1.6* 0.7  PROT 7.2 6.0*  ALBUMIN 3.9 3.3*   No results for input(s): LIPASE, AMYLASE in the last 168 hours. No results for input(s): AMMONIA in the last 168 hours. Coagulation Profile: No results for input(s): INR, PROTIME in the last 168 hours. Cardiac Enzymes: No results for input(s): CKTOTAL, CKMB, CKMBINDEX, TROPONINI in the last 168 hours. BNP (last 3 results) No results for input(s): PROBNP in the last 8760 hours. HbA1C: No results for input(s): HGBA1C in the last 72 hours. CBG: No results for input(s): GLUCAP in the last 168 hours. Lipid Profile: No results for input(s): CHOL, HDL, LDLCALC, TRIG, CHOLHDL, LDLDIRECT in the last 72 hours. Thyroid Function Tests: No results for input(s): TSH, T4TOTAL, FREET4, T3FREE, THYROIDAB in the last 72 hours. Anemia  Panel: No results for input(s): VITAMINB12, FOLATE, FERRITIN, TIBC, IRON, RETICCTPCT in the last 72 hours. Sepsis Labs: No results for input(s): PROCALCITON, LATICACIDVEN in the last 168 hours.  Recent Results (from the past 240 hour(s))  Resp Panel by RT-PCR (Flu A&B, Covid) Nasopharyngeal Swab     Status: None   Collection Time: 01/02/21  3:55 PM   Specimen: Nasopharyngeal Swab; Nasopharyngeal(NP) swabs in vial transport medium  Result Value Ref Range Status   SARS Coronavirus 2 by RT PCR NEGATIVE NEGATIVE Final    Comment: (NOTE) SARS-CoV-2 target nucleic acids are NOT DETECTED.  The SARS-CoV-2 RNA is generally detectable in upper respiratory specimens during the acute phase of infection. The lowest concentration of SARS-CoV-2 viral copies this assay can detect is 138 copies/mL. A negative result does not preclude SARS-Cov-2 infection and should not be used as the sole basis for treatment or other patient management decisions. A negative result may occur with  improper specimen collection/handling, submission of specimen other than nasopharyngeal swab, presence of viral  mutation(s) within the areas targeted by this assay, and inadequate number of viral copies(<138 copies/mL). A negative result must be combined with clinical observations, patient history, and epidemiological information. The expected result is Negative.  Fact Sheet for Patients:  EntrepreneurPulse.com.au  Fact Sheet for Healthcare Providers:  IncredibleEmployment.be  This test is no t yet approved or cleared by the [place] FDA and  has been authorized for detection and/or diagnosis of SARS-CoV-2 by FDA under an Emergency Use Authorization (EUA). This EUA will remain  in effect (meaning this test can be used) for the duration of the COVID-19 declaration under Section 564(b)(1) of the Act, 21 U.S.C.section 360bbb-3(b)(1), unless the authorization is terminated  or revoked sooner.       Influenza A by PCR NEGATIVE NEGATIVE Final   Influenza B by PCR NEGATIVE NEGATIVE Final    Comment: (NOTE) The Xpert Xpress SARS-CoV-2/FLU/RSV plus assay is intended as an aid in the diagnosis of influenza from Nasopharyngeal swab specimens and should not be used as a sole basis for treatment. Nasal washings and aspirates are unacceptable for Xpert Xpress SARS-CoV-2/FLU/RSV testing.  Fact Sheet for Patients: EntrepreneurPulse.com.au  Fact Sheet for Healthcare Providers: IncredibleEmployment.be  This test is not yet approved or cleared by the [place] FDA and has been authorized for detection and/or diagnosis of SARS-CoV-2 by FDA under an Emergency Use Authorization (EUA). This EUA will remain in effect (meaning this test can be used) for the duration of the COVID-19 declaration under Section 564(b)(1) of the Act, 21 U.S.C. section 360bbb-3(b)(1), unless the authorization is terminated or revoked.  Performed at [facility], [place] [address], [place], [place] [address]      Radiology Studies: No results  found.  Scheduled Meds: . aspirin EC  81 mg Oral QHS  . atenolol  25 mg Oral QHS  . atenolol  50 mg Oral Daily  . enoxaparin (LOVENOX) injection  40 mg Subcutaneous Daily  . fluticasone furoate-vilanterol  1 puff Inhalation Daily   And  . umeclidinium bromide  1 puff Inhalation Daily  . furosemide  40 mg Intravenous Q12H  . gabapentin  300 mg Oral QHS  . levothyroxine  112 mcg Oral QAC breakfast  . methylPREDNISolone (SOLU-MEDROL) injection  60 mg Intravenous Q8H  . omega-3 acid ethyl esters  1 g Oral Daily  . pantoprazole  40 mg Oral Daily  . [START ON 01/06/2021] potassium chloride  10 mEq Oral Daily  . rosuvastatin  20 mg Oral Daily  . Selexipag  1,400 mcg Oral BID  . tadalafil (PAH)  40 mg Oral Daily   Continuous Infusions:    LOS: 3 days   Marylu Lund, MD [facility] Pager On Amion  If 7PM-7AM, please contact night-coverage 01/05/2021, 4:57 PM

## 2021-01-06 LAB — COMPREHENSIVE METABOLIC PANEL
ALT: 40 U/L (ref 0–44)
AST: 39 U/L (ref 15–41)
Albumin: 3.5 g/dL (ref 3.5–5.0)
Alkaline Phosphatase: 40 U/L (ref 38–126)
Anion gap: 8 (ref 5–15)
BUN: 35 mg/dL — ABNORMAL HIGH (ref 8–23)
CO2: 31 mmol/L (ref 22–32)
Calcium: 9.4 mg/dL (ref 8.9–10.3)
Chloride: 100 mmol/L (ref 98–111)
Creatinine, Ser: 1.3 mg/dL — ABNORMAL HIGH (ref 0.44–1.00)
GFR, Estimated: 43 mL/min — ABNORMAL LOW (ref >60)
Glucose, Bld: 154 mg/dL — ABNORMAL HIGH (ref 70–99)
Potassium: 4.3 mmol/L (ref 3.5–5.1)
Sodium: 139 mmol/L (ref 135–145)
Total Bilirubin: 0.6 mg/dL (ref 0.3–1.2)
Total Protein: 6.6 g/dL (ref 6.5–8.1)

## 2021-01-06 LAB — CBC
HCT: 41 % (ref 36.0–46.0)
Hemoglobin: 13.6 g/dL (ref 12.0–15.0)
MCH: 30.1 pg (ref 26.0–34.0)
MCHC: 33.2 g/dL (ref 30.0–36.0)
MCV: 90.7 fL (ref 80.0–100.0)
Platelets: 195 10*3/uL (ref 150–400)
RBC: 4.52 MIL/uL (ref 3.87–5.11)
RDW: 15.6 % — ABNORMAL HIGH (ref 11.5–15.5)
WBC: 9 10*3/uL (ref 4.0–10.5)
nRBC: 0 % (ref 0.0–0.2)

## 2021-01-06 MED ORDER — SODIUM CHLORIDE 0.9 % IV SOLN: 250.0000 mL | INTRAVENOUS | Status: DC | PRN

## 2021-01-06 MED ORDER — SODIUM CHLORIDE 0.9 % IV SOLN: INTRAVENOUS | Status: DC

## 2021-01-06 MED ORDER — SODIUM CHLORIDE 0.9% FLUSH
3.0000 mL | Freq: Two times a day (BID) | INTRAVENOUS | Status: DC
  Administered 2021-01-06 – 2021-01-13 (×15): 3 mL via INTRAVENOUS

## 2021-01-06 MED ORDER — ASPIRIN 81 MG PO CHEW
81.0000 mg | CHEWABLE_TABLET | ORAL | Status: AC
  Administered 2021-01-07: 81 mg via ORAL
  Filled 2021-01-06: qty 1

## 2021-01-06 MED ORDER — SODIUM CHLORIDE 0.9% FLUSH: 3.0000 mL | INTRAVENOUS | Status: DC | PRN

## 2021-01-06 NOTE — Progress Notes (Signed)
PROGRESS NOTE    Kimberly Irwin  CEY:[id number] DOB: 09/15/1945 DOA: 01/02/2021 PCP: Mosie Lukes, MD    Brief Narrative:  75 y.o. female with medical history significant for COPD, sarcoidosis, HTN, HLD, HFpEF, cardiomegaly, chronic respiratory failure with hypoxia on oxygen at 7-8 L/min by [place] at home who presents for evaluation of worsening SOB. She presented by EMS on NRB and was started on Bipap in the ER. She has now been weaned to [facility]   Assessment & Plan:   Principal Problem:   Acute on chronic respiratory failure with hypoxia (HCC) Active Problems:   Sarcoidosis stage I   Hyperlipidemia   Essential hypertension   Hypothyroidism   Cardiomegaly   Pulmonary HTN (HCC)   Chronic heart failure with preserved ejection fraction (HFpEF) (HCC)   COPD with acute exacerbation (HCC)   Prolonged QT interval  Principal Problem:   Acute on chronic respiratory failure with hypoxia  -Baseline O2 is around 8L, currently remains on 20L, peak of requiring 30L -Presenting CT chest reviewed. Findings neg for PE, but with chronic emphysema with stable nodules, cardiomegaly -Pt is continued on schedule bronchodilators -Pt initially required Bipap in the ER but has been weaned to [place] records reviewed. Pt is followed by Dr. Vaughan Browner. Initial concerns for sarcoid, however further w/u determined no active disease.Pt remains on prednisone PTA, currently on IV solumedrol -Outpt high res CT on 3/22 was concerning for fibrotic changes categorized as indeterminate for usual  -Appreciate input by Pulmonary input. May likely wean steroids soon as acute disease process is likely not steroid responsive -Continue diuresis as tolerated -Discussed with Pulmonary. Plan for RHC tomorrow. Cardiology had been consulted  Active Problems:   COPD with acute exacerbation  -Continued on scheduled bronchodilator -Had been continued on on 70m Q8hrs solumedrol, per above, primary acute disease process thought  unlikely to be steroid responsive  Patient is currently on high flow nasal cannula and maintaining O2 saturation.   -She normally uses oxygen at 7 to 8 L/min, needing up to 30L high flow, down to 20L this AM -Cont to wean O2 as tolerated here    Essential hypertension -Continue home dose of atenolol twice a day -BP stable at present.    Cardiomegaly -Chronic.  Was September 2020 which showed LVH with EF of 60 to 65% with abnormal diastolic function -2d echo reviewed, EF 60-65% noted -Per pulmonary, plan for RHC tomorrow    Pulmonary HTN  -Continue tadalafil as pt tolerates -Likely primary source of presenting hypoxemic failure -Pt recently decreased dose of Uptravi. Ideally would like to resume higher dose, however current regimen and availability of med makes this difficult    Chronic heart failure with preserved ejection fraction (HFpEF)  -No signs of fluid overload at this time.   -Patient does have cardiomegaly on chest x-ray. -2d echo with preserved EF noted, Plan for RHC tomorrow     Sarcoidosis stage I -Pt had been on chronic 548mprednisone, per above -Currently on IV solumedrol per above -See above. No active sarcoid disease noted on recent CT imaging -Appreciate input by PCCM    Hyperlipidemia -Continue home dose of Crestor.   -Continue coenzyme Q 10 that she takes at home    Hypothyroidism -Continue home dose of levothyroxine as tolerated    Prolonged QT interval -Continuing to monitor on tele  T12 Compression fracture -Noted on imaging, following falls at home -Pt appears comfortable -Cont PT as tolerated. Recs for SNF noted. TOC was  consulted  DVT prophylaxis: Lovenox subq Code Status: DNR Family Communication: Pt in room, family not at bedside  Status is: Inpatient  Remains inpatient appropriate because:Inpatient level of care appropriate due to severity of illness   Dispo: The patient is from: Home              Anticipated d/c is to:  Home              Patient currently is not medically stable to d/c.   Difficult to place patient No   Consultants:   Pulmonary  Cardiology  Procedures:     Antimicrobials: Anti-infectives (From admission, onward)   None      Subjective: Feels weak this AM, eager to go home  Objective: Vitals:   01/06/21 1149 01/06/21 1519 01/06/21 1542 01/06/21 1649  BP:    119/81  Pulse: (!) 54 (!) 54  (!) 56  Resp: _0 Temp:    98.5 F (36.9 C)  TempSrc:    Oral  SpO2: 93% 96%  95%  Weight:   77.7 kg   Height:        Intake/Output Summary (Last 24 hours) at 01/06/2021 1708 Last data filed at 01/06/2021 1937 Gross per 24 hour  Intake 240 ml  Output 1800 ml  Net -1560 ml   Filed Weights   01/04/21 0402 01/05/21 0500 01/06/21 1542  Weight: 74.4 kg 75.1 kg 77.7 kg    Examination: General exam: Conversant, in no acute distress Respiratory system: normal chest rise, clear, no audible wheezing Cardiovascular system: regular rhythm, s1-s2 Gastrointestinal system: Nondistended, nontender, pos BS Central nervous system: No seizures, no tremors Extremities: No cyanosis, no joint deformities Skin: No rashes, no pallor Psychiatry: Affect normal // no auditory hallucinations   Data Reviewed: I have personally reviewed following labs and imaging studies  CBC: Recent Labs  Lab 01/02/21 1453 01/03/21 0300 01/04/21 0052 01/06/21 0212  WBC 13.2* 8.9 11.8* 9.0  NEUTROABS 10.9*  --   --   --   HGB 14.7 13.5 12.8 13.6  HCT 44.1 40.7 38.0 41.0  MCV 90.9 88.7 90.7 90.7  PLT 180 156 158 902   Basic Metabolic Panel: Recent Labs  Lab 01/02/21 1453 01/03/21 0300 01/04/21 0052 01/05/21 0158 01/06/21 0212  NA 136 135 138 137 139  K 4.2 3.4* 5.0 3.3* 4.3  CL 98 94* 103 98 100  CO2 _1 GLUCOSE 147* 214* 134* 151* 154*  BUN 20 22 24* 31* 35*  CREATININE 1.18* 1.20* 1.11* 1.31* 1.30*  CALCIUM 9.3 9.4 9.3 8.8* 9.4   GFR: Estimated Creatinine Clearance: 37.1  mL/min (A) (by C-G formula based on SCr of 1.3 mg/dL (H)). Liver Function Tests: Recent Labs  Lab 01/02/21 1453 01/04/21 0052 01/06/21 0212  AST 30 16 39  ALT 20 15 40  ALKPHOS 42 35* 40  BILITOT 1.6* 0.7 0.6  PROT 7.2 6.0* 6.6  ALBUMIN 3.9 3.3* 3.5   No results for input(s): LIPASE, AMYLASE in the last 168 hours. No results for input(s): AMMONIA in the last 168 hours. Coagulation Profile: No results for input(s): INR, PROTIME in the last 168 hours. Cardiac Enzymes: No results for input(s): CKTOTAL, CKMB, CKMBINDEX, TROPONINI in the last 168 hours. BNP (last 3 results) No results for input(s): PROBNP in the last 8760 hours. HbA1C: No results for input(s): HGBA1C in the last 72 hours. CBG: No results for input(s): GLUCAP in the last 168 hours. Lipid Profile:  No results for input(s): CHOL, HDL, LDLCALC, TRIG, CHOLHDL, LDLDIRECT in the last 72 hours. Thyroid Function Tests: No results for input(s): TSH, T4TOTAL, FREET4, T3FREE, THYROIDAB in the last 72 hours. Anemia Panel: No results for input(s): VITAMINB12, FOLATE, FERRITIN, TIBC, IRON, RETICCTPCT in the last 72 hours. Sepsis Labs: No results for input(s): PROCALCITON, LATICACIDVEN in the last 168 hours.  Recent Results (from the past 240 hour(s))  Resp Panel by RT-PCR (Flu A&B, Covid) Nasopharyngeal Swab     Status: None   Collection Time: 01/02/21  3:55 PM   Specimen: Nasopharyngeal Swab; Nasopharyngeal(NP) swabs in vial transport medium  Result Value Ref Range Status   SARS Coronavirus 2 by RT PCR NEGATIVE NEGATIVE Final    Comment: (NOTE) SARS-CoV-2 target nucleic acids are NOT DETECTED.  The SARS-CoV-2 RNA is generally detectable in upper respiratory specimens during the acute phase of infection. The lowest concentration of SARS-CoV-2 viral copies this assay can detect is 138 copies/mL. A negative result does not preclude SARS-Cov-2 infection and should not be used as the sole basis for treatment or other patient  management decisions. A negative result may occur with  improper specimen collection/handling, submission of specimen other than nasopharyngeal swab, presence of viral mutation(s) within the areas targeted by this assay, and inadequate number of viral copies(<138 copies/mL). A negative result must be combined with clinical observations, patient history, and epidemiological information. The expected result is Negative.  Fact Sheet for Patients:  EntrepreneurPulse.com.au  Fact Sheet for Healthcare Providers:  IncredibleEmployment.be  This test is no t yet approved or cleared by the [place] FDA and  has been authorized for detection and/or diagnosis of SARS-CoV-2 by FDA under an Emergency Use Authorization (EUA). This EUA will remain  in effect (meaning this test can be used) for the duration of the COVID-19 declaration under Section 564(b)(1) of the Act, 21 U.S.C.section 360bbb-3(b)(1), unless the authorization is terminated  or revoked sooner.       Influenza A by PCR NEGATIVE NEGATIVE Final   Influenza B by PCR NEGATIVE NEGATIVE Final    Comment: (NOTE) The Xpert Xpress SARS-CoV-2/FLU/RSV plus assay is intended as an aid in the diagnosis of influenza from Nasopharyngeal swab specimens and should not be used as a sole basis for treatment. Nasal washings and aspirates are unacceptable for Xpert Xpress SARS-CoV-2/FLU/RSV testing.  Fact Sheet for Patients: EntrepreneurPulse.com.au  Fact Sheet for Healthcare Providers: IncredibleEmployment.be  This test is not yet approved or cleared by the [place] FDA and has been authorized for detection and/or diagnosis of SARS-CoV-2 by FDA under an Emergency Use Authorization (EUA). This EUA will remain in effect (meaning this test can be used) for the duration of the COVID-19 declaration under Section 564(b)(1) of the Act, 21 U.S.C. section 360bbb-3(b)(1),  unless the authorization is terminated or revoked.  Performed at [facility], [place] [address], [place], [place] [address]      Radiology Studies: No results found.  Scheduled Meds: . [START ON 01/07/2021] aspirin  81 mg Oral Pre-Cath  . aspirin EC  81 mg Oral QHS  . atenolol  25 mg Oral QHS  . atenolol  50 mg Oral Daily  . enoxaparin (LOVENOX) injection  40 mg Subcutaneous Daily  . fluticasone furoate-vilanterol  1 puff Inhalation Daily   And  . umeclidinium bromide  1 puff Inhalation Daily  . furosemide  40 mg Intravenous Q12H  . gabapentin  300 mg Oral QHS  . levothyroxine  112 mcg Oral QAC breakfast  .  methylPREDNISolone (SOLU-MEDROL) injection  60 mg Intravenous Q8H  . omega-3 acid ethyl esters  1 g Oral Daily  . pantoprazole  40 mg Oral Daily  . potassium chloride  10 mEq Oral Daily  . rosuvastatin  20 mg Oral Daily  . Selexipag  1,400 mcg Oral BID  . sodium chloride flush  3 mL Intravenous Q12H  . tadalafil (PAH)  40 mg Oral Daily   Continuous Infusions: . sodium chloride    . [START ON 01/07/2021] sodium chloride       LOS: 4 days   Marylu Lund, MD [facility] Pager On Amion  If 7PM-7AM, please contact night-coverage 01/06/2021, 5:08 PM

## 2021-01-06 NOTE — TOC Initial Note (Signed)
Transition of Care [facility]) - Initial/Assessment Note    Patient Details  Name: Kimberly Irwin MRN: [id number] Date of Birth: [birth year]  Transition of [facility]) CM/SW Contact:    Coralee Pesa, [place] Phone Number: 01/06/2021, 3:11 PM  Clinical Narrative:                 CSW met with pt at bedside and explained the recommendation for SNF. Pt states she will not go to SNF, but would like to go home w/ HH. She currently lives w/ her daughter, but her daughter works during the day. She said her other two daughters and son in law can also probably help. She currently has a wheel chair, walker, 3 in 1, but may need a shower bench. 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 CM is following for [facility] needs.  Expected Discharge Plan: [place] Barriers to Discharge: Continued Medical Work up   Patient Goals and CMS Choice Patient states their goals for this hospitalization and ongoing recovery are:: Pt states she does not want to go to rehab and would like to go home with [facility]. CMS Medicare.gov Compare Post Acute Care list provided to:: Patient Choice offered to / list presented to : Patient  Expected Discharge Plan and Services Expected Discharge Plan: [place] Acute Care Choice: [place] arrangements for the past 2 months: Single Family Home                                      Prior Living Arrangements/Services Living arrangements for the past 2 months: Single Family Home Lives with:: Adult Children Patient language and need for interpreter reviewed:: Yes Do you feel safe going back to the place where you live?: Yes      Need for Family Participation in Patient Care: Yes (Comment) Care giver support system in place?: Yes (comment)   Criminal Activity/Legal Involvement Pertinent to Current Situation/Hospitalization: No - Comment as needed  Activities of Daily Living Home Assistive  Devices/Equipment: None ADL Screening (condition at time of admission) Patient's cognitive ability adequate to safely complete daily activities?: Yes Is the patient deaf or have difficulty hearing?: No Does the patient have difficulty seeing, even when wearing glasses/contacts?: No Does the patient have difficulty concentrating, remembering, or making decisions?: No Patient able to express need for assistance with ADLs?: Yes Does the patient have difficulty dressing or bathing?: No Independently performs ADLs?: Yes (appropriate for developmental age) Does the patient have difficulty walking or climbing stairs?: No Weakness of Legs: Right Weakness of Arms/Hands: Right  Permission Sought/Granted Permission sought to share information with : Family Supports Permission granted to share information with : Yes, Verbal Permission Granted  Share Information with NAME: Kimberly Irwin     Permission granted to share info w Relationship: Daughter  Permission granted to share info w Contact Information: [phone]  Emotional Assessment Appearance:: Appears stated age Attitude/Demeanor/Rapport: Engaged Affect (typically observed): Pleasant Orientation: : Oriented to Self,Oriented to Place,Oriented to  Time,Oriented to Situation Alcohol / Substance Use: Not Applicable Psych Involvement: No (comment)  Admission diagnosis:  Hypoxia [R09.02] Acute on chronic respiratory failure with hypoxia ([place]) [J96.21] Patient Active Problem List   Diagnosis Date Noted  . Chronic heart failure with preserved ejection fraction (HFpEF) ([place]) 01/02/2021  . COPD with acute exacerbation (  [place]) 01/02/2021  . Prolonged QT interval 01/02/2021  . Diarrhea 09/15/2018  . Pulmonary HTN ([place]) 01/28/2018  . Acute on chronic respiratory failure with hypoxia ([place]) 01/28/2018  . Epistaxis 12/28/2017  . Hypoxia, sleep related 10/21/2016  . Cardiomegaly 07/13/2016  . Coronary artery calcification 07/03/2016  . COPD (chronic  obstructive pulmonary disease) ([place]) 07/01/2016  . Hypersomnia 07/01/2016  . Chronic respiratory failure with hypoxia ([place]) 05/20/2016  . Dermatitis 04/14/2016  . SOB (shortness of breath) 04/03/2016  . Hypothyroidism 10/28/2015  . Lumbar compression fracture ([place]) 09/14/2014  . Medicare annual wellness visit, subsequent 04/27/2014  . Obesity   . S/P left THA, AA 06/28/2013  . Decreased visual acuity 02/19/2013  . Hyperglycemia 04/04/2011  . Chronic pain syndrome 01/20/2011  . Depression 08/10/2008  . GERD 04/26/2008  . Sinusitis 08/02/2007  . Lumbar disc disease 05/31/2007  . 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 Hyperlipidemia 05/29/2007  . Tremor   . Essential hypertension    PCP:  Mosie Lukes, MD Pharmacy:   [id number] [place], [address] [place] [address]Phone: [phone]: [phone] ASan Fernando [place] [address]Phone: [phone]: [phone] [facility] [address]Phone: [phone]: [phone] [place]Phone: [phone]: [phone] ASan Mar ARockinghamDr. WMelina Modena[address] RoadDr[address] WFredderick SeveranceA[place]inois[place]iana[address]Phone: [phone]: [address] [address]Phone: [phone]: [phone]    Social Determinants of Health (SDOH) Interventions    Readmission Risk Interventions No flowsheet data found.

## 2021-01-06 NOTE — Progress Notes (Signed)
Pt on [place]ter HF at this tinme.  [place] on SB at bedside.

## 2021-01-06 NOTE — Progress Notes (Signed)
Patient ID: Kimberly Irwin, female   DOB: [birth year], 75 y.o.   MRN: [id number]  Patient known to me from the office, history of sarcoidosis with PAH.  Has been on Uptravi and tadalafil.  Recently decreased Uptravi, now presents with acute on chronic respiratory failure.  Also with ?UIP.    Asked by Dr. Carlis Abbott to do [place] tomorrow to see if PA pressures significantly elevated.  If so, would consider increasing back up to prior dose gradually.    I discussed risks/benefits with patient, she agrees to procedure.   [name] 01/06/2021 11:33 AM

## 2021-01-06 NOTE — Plan of Care (Signed)
  Problem: Coping: Goal: Level of anxiety will decrease Outcome: Progressing   Problem: Elimination: Goal: Will not experience complications related to bowel motility Outcome: Progressing   Problem: Elimination: Goal: Will not experience complications related to urinary retention Outcome: Progressing   Problem: Pain Managment: Goal: General experience of comfort will improve Outcome: Progressing

## 2021-01-06 NOTE — Progress Notes (Signed)
Kimberly Irwin feels about the same. On 15L HFNC. Planning for RHC tomorrow to confirm diagnosis of worsened PH. D/w Dr. Aundra Dubin. NPO past midnight except for sips with meds.  Julian Hy, DO 01/06/21 5:02 PM [facility]

## 2021-01-07 ENCOUNTER — Encounter (HOSPITAL_COMMUNITY)
Admission: EM | Disposition: A | Discharge status: Home / Self Care | Payer: Self-pay | Source: Home / Self Care | Attending: Internal Medicine

## 2021-01-07 ENCOUNTER — Encounter (HOSPITAL_COMMUNITY): Payer: Self-pay | Admitting: Cardiology

## 2021-01-07 DIAGNOSIS — I272 Pulmonary hypertension, unspecified: Secondary | ICD-10-CM | POA: Diagnosis not present

## 2021-01-07 DIAGNOSIS — I517 Cardiomegaly: Secondary | ICD-10-CM

## 2021-01-07 HISTORY — PX: RIGHT HEART CATH: CATH118263

## 2021-01-07 LAB — POCT I-STAT EG7
Acid-Base Excess: 10 mmol/L — ABNORMAL HIGH (ref 0.0–2.0)
Acid-Base Excess: 9 mmol/L — ABNORMAL HIGH (ref 0.0–2.0)
Bicarbonate: 34.2 mmol/L — ABNORMAL HIGH (ref 20.0–28.0)
Bicarbonate: 35 mmol/L — ABNORMAL HIGH (ref 20.0–28.0)
Calcium, Ion: 1.13 mmol/L — ABNORMAL LOW (ref 1.15–1.40)
Calcium, Ion: 1.15 mmol/L (ref 1.15–1.40)
HCT: 45 % (ref 36.0–46.0)
HCT: 45 % (ref 36.0–46.0)
Hemoglobin: 15.3 g/dL — ABNORMAL HIGH (ref 12.0–15.0)
Hemoglobin: 15.3 g/dL — ABNORMAL HIGH (ref 12.0–15.0)
O2 Saturation: 70 %
O2 Saturation: 71 %
Potassium: 4.1 mmol/L (ref 3.5–5.1)
Potassium: 4.2 mmol/L (ref 3.5–5.1)
Sodium: 138 mmol/L (ref 135–145)
Sodium: 138 mmol/L (ref 135–145)
TCO2: 36 mmol/L — ABNORMAL HIGH (ref 22–32)
TCO2: 36 mmol/L — ABNORMAL HIGH (ref 22–32)
pCO2, Ven: 47.7 mmHg (ref 44.0–60.0)
pCO2, Ven: 48.7 mmHg (ref 44.0–60.0)
pH, Ven: 7.463 — ABNORMAL HIGH (ref 7.250–7.430)
pH, Ven: 7.464 — ABNORMAL HIGH (ref 7.250–7.430)
pO2, Ven: 35 mmHg (ref 32.0–45.0)
pO2, Ven: 36 mmHg (ref 32.0–45.0)

## 2021-01-07 LAB — COMPREHENSIVE METABOLIC PANEL
ALT: 59 U/L — ABNORMAL HIGH (ref 0–44)
AST: 45 U/L — ABNORMAL HIGH (ref 15–41)
Albumin: 3.6 g/dL (ref 3.5–5.0)
Alkaline Phosphatase: 42 U/L (ref 38–126)
Anion gap: 10 (ref 5–15)
BUN: 42 mg/dL — ABNORMAL HIGH (ref 8–23)
CO2: 32 mmol/L (ref 22–32)
Calcium: 9.3 mg/dL (ref 8.9–10.3)
Chloride: 94 mmol/L — ABNORMAL LOW (ref 98–111)
Creatinine, Ser: 1.54 mg/dL — ABNORMAL HIGH (ref 0.44–1.00)
GFR, Estimated: 35 mL/min — ABNORMAL LOW (ref >60)
Glucose, Bld: 169 mg/dL — ABNORMAL HIGH (ref 70–99)
Potassium: 4.4 mmol/L (ref 3.5–5.1)
Sodium: 136 mmol/L (ref 135–145)
Total Bilirubin: 1 mg/dL (ref 0.3–1.2)
Total Protein: 6.5 g/dL (ref 6.5–8.1)

## 2021-01-07 LAB — CBC
HCT: 44.5 % (ref 36.0–46.0)
Hemoglobin: 14.8 g/dL (ref 12.0–15.0)
MCH: 29.9 pg (ref 26.0–34.0)
MCHC: 33.3 g/dL (ref 30.0–36.0)
MCV: 89.9 fL (ref 80.0–100.0)
Platelets: 210 10*3/uL (ref 150–400)
RBC: 4.95 MIL/uL (ref 3.87–5.11)
RDW: 15.5 % (ref 11.5–15.5)
WBC: 10.5 10*3/uL (ref 4.0–10.5)
nRBC: 0 % (ref 0.0–0.2)

## 2021-01-07 SURGERY — RIGHT HEART CATH
Anesthesia: LOCAL

## 2021-01-07 MED ORDER — PREDNISONE 10 MG PO TABS
10.0000 mg | ORAL_TABLET | Freq: Every day | ORAL | Status: DC
  Administered 2021-01-08 – 2021-01-14 (×7): 10 mg via ORAL
  Filled 2021-01-07 (×7): qty 1

## 2021-01-07 MED ORDER — LIDOCAINE HCL (PF) 1 % IJ SOLN
INTRAMUSCULAR | Status: DC | PRN
  Administered 2021-01-07: 2 mL

## 2021-01-07 MED ORDER — HEPARIN (PORCINE) IN NACL 1000-0.9 UT/500ML-% IV SOLN
INTRAVENOUS | Status: DC | PRN
  Administered 2021-01-07: 500 mL

## 2021-01-07 SURGICAL SUPPLY — 8 items
CATH BALLN WEDGE 5F 110CM (CATHETERS) ×2 IMPLANT
GUIDEWIRE .025 260CM (WIRE) ×2 IMPLANT
PACK CARDIAC CATHETERIZATION (CUSTOM PROCEDURE TRAY) ×2 IMPLANT
PROTECTION STATION PRESSURIZED (MISCELLANEOUS) ×2
SHEATH GLIDE SLENDER 4/5FR (SHEATH) ×2 IMPLANT
STATION PROTECTION PRESSURIZED (MISCELLANEOUS) ×1 IMPLANT
TRANSDUCER W/STOPCOCK (MISCELLANEOUS) ×2 IMPLANT
TUBING ART PRESS 72  MALE/MALE (TUBING) ×2 IMPLANT

## 2021-01-07 NOTE — Progress Notes (Signed)
Brief PCCM Note:  RHC performed, PVR 4.7 WU, CO and CI preserved, normal filling pressures. Recommend to increase Uptravi to 1600 mcg BID on discharge. If O2 can not be further titrated closer to baseline 8L, consider inpatient increase. Will arrange outpatient pulmonary follow up and planned continued uptitration of Uptravi as tolerated.

## 2021-01-07 NOTE — TOC Progression Note (Addendum)
Transition of Care [facility]) - Progression Note    Patient Details  Name: Kimberly Irwin MRN: [id number] Date of Birth: 01/12/1946  Transition [facility]) CM/SW Contact  Angelita Ingles, RN Phone Number: [phone]  01/07/2021, 3:10 PM  Clinical Narrative:    TOC following patient that has refused SNF. Patient will go home with family support and HH.CM at bedside to offer choice. HH follow up has been initiated with [facility]. TOC will continue to follow.    Expected Discharge Plan: [place] Barriers to Discharge: Continued Medical Work up  Expected Discharge Plan and Services Expected Discharge Plan: [place] Choice: [place] arrangements for the past 2 months: Single Family Home                                       Social Determinants of Health (SDOH) Interventions    Readmission Risk Interventions No flowsheet data found.

## 2021-01-07 NOTE — Plan of Care (Signed)
  Problem: Pain Managment: Goal: General experience of comfort will improve Outcome: Progressing   Problem: Skin Integrity: Goal: Risk for impaired skin integrity will decrease Outcome: Progressing

## 2021-01-07 NOTE — Progress Notes (Addendum)
NAME:  Kimberly Irwin, MRN:  [id number], DOB:  1946/01/07, LOS: 5 ADMISSION DATE:  01/02/2021, CONSULTATION DATE:  01/04/2021 REFERRING MD:  Maylene Roes - TRH , CHIEF COMPLAINT:  SOB, hypoxia   History of Present Illness:  75 yo F PMH sarcoidosis (no active disease but remains on steroids for SOB), Emphysema, HFpEF , pulmonary HTN, Chronic hypoxic respiratory failure on 8LNC admitted to [facility] 6/1 with hypoxia after a fall from chair to knees, after which EMS was dispatched and brought pt to hospital due to hypoxia. Pt does endorse increasing SOB in the evening for about a week ever since her last Uptravi decrease. Also endorses an intermittent cough associated  with white mucus. Denied fever, chest pain, palpitations, sick contacts, new environmental exposures/allergens. CTA chest without evidence of PE. Exam findings most consistent with AECOPD, was started on solumedrol 6/2 and continued on BDs. COVID is negative, no RVP sent, not on abx. Since admission 6/1, O2 requirements have de-escalated from NRB , BiPAP to HFNC but have remained significantly higher than baseline. Currently on HFNC 80% FiO2 20 L/min PCCM consulted 6/3 for persistent hypoxia, inability to wean O2.     Of note, recent outpatient HRCT 10/08/20 with some fibrotic changes, paraseptal and centrilobular emphysema, diffuse bronchial wall thickening  In review of documentation from [facility] (where pt follows for pulmonary HTN) it looks like Uptravi was recently decreased from 2400 to 1700 to 1400 (last decrease approx 1week PTA)  Pertinent  Medical History  Sarcodisis  Pulmonary HTN HFpEF Emphysema  Chronic hypoxic respiratory failure  HTN HLD DDD S/p L3-4, 4-5posterior laminectomy, fusion, fixation GERD Hiatal hernia Chronic tremor  Hypothyroidism Significant Hospital Events: Including procedures, antibiotic start and stop dates in addition to other pertinent events   . 6/1 admitted to [facility] for acute on chronic hypoxia  . 6/3 still on  HFNC, PCCM consulted for inability to wean O2  . 6/4 25L, 70% FiO2 . 6/6 weaned to 10L [place], did ok low 90s on 8L  Interim History / Subjective:  She feels about the same, breathing is better. Discussed care with Dr. Regan Lemming at [facility].  Objective   Blood pressure 137/81, pulse (!) 53, temperature 98 F (36.7 C), temperature source Oral, resp. rate 18, height _0  (1.626 m), weight 77.7 kg, SpO2 93 %.    FiO2 (%):  [50 %] 50 %   Intake/Output Summary (Last 24 hours) at 01/07/2021 1119 Last data filed at 01/07/2021 1224 Gross per 24 hour  Intake --  Output 2150 ml  Net -2150 ml   Filed Weights   01/04/21 0402 01/05/21 0500 01/06/21 1542  Weight: 74.4 kg 75.1 kg 77.7 kg    Examination: General: frail, chronically ill appearing woman lying in bed eating lunch HENT: [place]/AT, eyes anicteric Lungs: breathing comfortably on [place] without accessory muscle use Cardiovascular: S1S2, RRR Abdomen: soft, NT, ND  Extremities:  no ankle edema Neuro: head bobbing/oral tremor, answering questions appropriately, globally weak Derm: pallor, no rashes  Labs/imaging that I havepersonally reviewed  (right click and "Reselect all SmartList Selections" daily)  HRCT 10/08/20 with some fibrotic changes, paraseptal and centrilobular emphysema, diffuse bronchial wall thickening   Out pt autoimmune/rheum labs -- unremarkable except ANA positive, speckled 1:40  CTA chest 6/2> no PE. Tortuous vessels. Patent major airways, low lung volumes. Centrolobular emphysema. Stable nodules.   6/1 BNP 450 >> 513 on 6/4  BUN 31 Cr 1.31  Per [facility] records, prev PH workup: RHC:  11/2017: RA 2, RV  66/5, PA 66/16, mPAP 34, PAWP 3, PA saturation 60% AO 92% Cardiac Output (Fick) 3.73, Cardiac Index (Fick) 2.04, PVR 8.3 WU   05/2019: (mPAP 28 mmHg and PVR 2.75 WU)  V/Q scan: 12/2017: Normal perfusion lung scan. Minimally diminished ventilation to the RIGHT apex.  6MWT 05/17/19: ambulated 122 meters, required 6 L of  supplemental O2    Resolved Hospital Problem list     Assessment & Plan:   Acute on chronic hypoxic respiratory failure; suspect this is due to worsened PH as her pulm vasodilators have been recently decreased due to concern that their up-titration in the fall of 2020 made her worse.Leading to worsened RV pressure/volume overload and VQ mismatch. Suspect drops low at home leading to hyopoxemic pulmonary vasoconstriction further exacerbating things. Hypoxemia improved with diuresis. Emphysema Sarcoidosis  -autoimmune w/u unrevealing -ANA weakly positive, speckled -Esbreit not yet started, just got approved. 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 Our pharmacy does not stock this and it can only be obtained from specialty outpatient pharmacies.  -She was recently given prednisone taper- 103m x 2 weeks, then 162mx 1 week, 1039maily -prev intolerant to macitentan -on tadalafil 97m52mily -BNP on admission 500   Plan: -Con't triple inhaled therapy -discontinue high dose solumedrol -prednisone 10 mg daily x 1 week then 5 mg x 1 week then off -con't tadalafil at PTA dose -continue uptravi 1400 mcg BID, RHC today, if PVR elevated or other hemos worse than prior would favor increasing -IS, mobility -- will be very important for this patient, need to get PT and really push mobility while inpatient given her baseline limited mobility  -Wean O2 as able  We will continue to follow.  Best practice (right click and "Reselect all SmartList Selections" daily)  Per  primary   MattLanier Clam  01/07/21 11:19 AM [facility] See Amion for contact info

## 2021-01-07 NOTE — Progress Notes (Signed)
Physical Therapy Treatment Patient Details Name: Kimberly Irwin MRN: [id number] DOB: [birth year] Today's Date: 01/07/2021    History of Present Illness 75 y.o. female admitted 6/1 who presents for evaluation of worsening SOB. She presented by EMS on NRB and was started on Bipap in the ER. She was weaned to heated HFNC and then to HFNC by 6/6.  PMH:  COPD, sarcoidosis, HTN, HLD, HFpEF, cardiomegaly, chronic respiratory failure with hypoxia on oxygen 7-8 L/minNC at home    PT Comments    Pt admitted with above diagnosis. Pt was able to ambulate in room with min to min guard asssist with RW with overall steady gait. She does desaturate on 10 LHFNC to 83% therefore incr to 15LHFNC and sats 86% and above for rest of walk with improvement with pursed lip breathing. Overall pt did much better today from last visit with ability to ambulate in room as well as respiratory status much improved.  Still requires incr O2 but tolerated activity better. Continue to agree with a short term Rehab at SNF.  Pt currently with functional limitations due to balance and endurance deficits. Pt will benefit from skilled PT to increase their independence and safety with mobility to allow discharge to the venue listed below.     Follow Up Recommendations  SNF     Equipment Recommendations  None recommended by PT    Recommendations for Other Services       Precautions / Restrictions Precautions Precautions: Fall Restrictions Weight Bearing Restrictions: No    Mobility  Bed Mobility Overal bed mobility: Independent             General bed mobility comments: incr time    Transfers Overall transfer level: Needs assistance Equipment used: Rolling walker (2 wheeled) Transfers: Sit to/from [facility] Sit to Stand: Min assist         General transfer comment: Needed assist to power up to RW. Pt on 10LHFNC  Ambulation/Gait Ambulation/Gait assistance: Min assist;Min guard Gait Distance  (Feet): 30 Feet Assistive device: Rolling walker (2 wheeled) Gait Pattern/deviations: Step-through pattern;Decreased stride length;Shuffle;Drifts right/left;Trunk flexed;Wide base of support   Gait velocity interpretation: <1.31 ft/sec, indicative of household ambulator General Gait Details: Pt able to ambulate with RW to door and back to bed.  Pt steady on feet with RW overall just needed a little assist to steer as well as to stay close to rW.  O2 saturations decr to 82% on 10L therefore turned to 15L to keep them >85%.  Pt also improved sats with pursed lip breathing.  Saturation at rest was >90% on 10L.   Stairs             Wheelchair Mobility    Modified Rankin (Stroke Patients Only)       Balance Overall balance assessment: Needs assistance Sitting-balance support: No upper extremity supported;Feet supported Sitting balance-Leahy Scale: Fair     Standing balance support: Bilateral upper extremity supported;During functional activity Standing balance-Leahy Scale: Poor Standing balance comment: relies on UE support                            Cognition Arousal/Alertness: Awake/alert Behavior During Therapy: WFL for tasks assessed/performed Overall Cognitive Status: Within Functional Limits for tasks assessed  Exercises General Exercises - Lower Extremity Ankle Circles/Pumps: AROM;Both;10 reps;Seated Long Arc Quad: AROM;Both;10 reps;Seated Hip Flexion/Marching: AROM;Both;10 reps;Seated    General Comments        Pertinent Vitals/Pain Pain Assessment: No/denies pain    Home Living                      Prior Function            PT Goals (current goals can now be found in the care plan section) Acute Rehab PT Goals Patient Stated Goal: to go home Progress towards PT goals: Progressing toward goals    Frequency    Min 3X/week      PT Plan Current plan remains appropriate     Co-evaluation              AM-PAC PT "6 Clicks" Mobility   Outcome Measure  Help needed turning from your back to your side while in a flat bed without using bedrails?: A Little Help needed moving from lying on your back to sitting on the side of a flat bed without using bedrails?: A Little Help needed moving to and from a bed to a chair (including a wheelchair)?: A Little Help needed standing up from a chair using your arms (e.g., wheelchair or bedside chair)?: A Little Help needed to walk in hospital room?: A Little Help needed climbing 3-5 steps with a railing? : A Lot 6 Click Score: 17    End of Session Equipment Utilized During Treatment: Gait belt;Oxygen Activity Tolerance: Patient limited by fatigue Patient left: in chair;with call bell/phone within reach;with chair alarm set Nurse Communication: Mobility status PT Visit Diagnosis: Unsteadiness on feet (R26.81);Muscle weakness (generalized) (M62.81)     Time: 2951-8841 PT Time Calculation (min) (ACUTE ONLY): 23 min  Charges:  $Gait Training: 23-37 mins                     Kyian Obst M,PT Acute Rehab Services [phone] (pager)   [name] 01/07/2021, 2:24 PM

## 2021-01-07 NOTE — Care Management Important Message (Signed)
Important Message  Patient Details  Name: Kimberly Irwin MRN: [id number] Date of Birth: 05-24-1946   Medicare Important Message Given:  Yes     Orbie Pyo 01/07/2021, 4:05 PM

## 2021-01-07 NOTE — Progress Notes (Signed)
PROGRESS NOTE    Kimberly Irwin  VIF:[id number] DOB: [birth year] DOA: 01/02/2021 PCP: Mosie Lukes, MD    Brief Narrative:  75 y.o. female with medical history significant for COPD, sarcoidosis, HTN, HLD, HFpEF, cardiomegaly, chronic respiratory failure with hypoxia on oxygen at 7-8 L/min by [place] at home who presents for evaluation of worsening SOB. She presented by EMS on NRB and was started on Bipap in the ER. She has now been weaned to [facility]   Assessment & Plan:   Principal Problem:   Acute on chronic respiratory failure with hypoxia (HCC) Active Problems:   Sarcoidosis stage I   Hyperlipidemia   Essential hypertension   Hypothyroidism   Cardiomegaly   Pulmonary HTN (HCC)   Chronic heart failure with preserved ejection fraction (HFpEF) (HCC)   COPD with acute exacerbation (HCC)   Prolonged QT interval  Principal Problem:   Acute on chronic respiratory failure with hypoxia  -Baseline O2 is around 8L, currently remains on 20L, peak of requiring 30L -Presenting CT chest neg for PE -Outpt high res CT on 3/22 was concerning for fibrotic changes categorized as indeterminate for usual  -PCCM following. Had been on IV solumedrol. IV solumedrol d/c today with prednisone taper ordered by PCCM -Continue diuresis as tolerated -Pt is now s/p RHC with PVR 4.7, recs to increase Uptravi to 16100m BID on d/c  Active Problems:   COPD, chronic -Continued on scheduled bronchodilator -Had been continued on on 683mQ8hrs solumedrol, per above, now on PO prednisone taper  Patient is currently on high flow nasal cannula and maintaining O2 saturation.   -She normally uses oxygen at 7 to 8 L/min, needing up to 30L high flow, down to 20L this AM -Cont to wean O2 as tolerated here    Essential hypertension -Continue home dose of atenolol twice a day -BP stable and controlled    Cardiomegaly -Chronic.  Was September 2020 which showed LVH with EF of 60 to 65% with abnormal diastolic function -2d  echo reviewed, EF 60-65% noted -Per pulmonary, plan for RHC tomorrow    Pulmonary HTN  -Continue tadalafil as pt tolerates -Likely primary source of presenting hypoxemic failure -Pt is s/p RHC. PCCM rec to increase Uptravi ato 160067mt time of d/c    Chronic heart failure with preserved ejection fraction (HFpEF)  -No signs of fluid overload at this time.   -Patient does have cardiomegaly on chest x-ray. -2d echo with preserved EF noted, s/p RHC today    Sarcoidosis stage I -Pt had been on chronic 5mg96mednisone, per above -Currently on IV solumedrol per above -See above. No active sarcoid disease noted on recent CT imaging -Appreciate input by PCCM    Hyperlipidemia -Continue home dose of Crestor.   -Continue coenzyme Q 10 that she takes at home    Hypothyroidism -Continue home dose of levothyroxine as tolerated    Prolonged QT interval -Thus far stable on tele  T12 Compression fracture -Noted on imaging, following falls at home -Pt appears comfortable -Cont PT as tolerated. Recs for SNF noted. TOC was consulted  DVT prophylaxis: Lovenox subq Code Status: DNR Family Communication: Pt in room, family not at bedside  Status is: Inpatient  Remains inpatient appropriate because:Inpatient level of care appropriate due to severity of illness   Dispo: The patient is from: Home              Anticipated d/c is to: Home  Patient currently is not medically stable to d/c.   Difficult to place patient No   Consultants:   Pulmonary  Cardiology  Procedures:     Antimicrobials: Anti-infectives (From admission, onward)   None      Subjective: Seen prior to [place], pt reports feeling anxious  Objective: Vitals:   01/07/21 0900 01/07/21 1313 01/07/21 1426 01/07/21 1507  BP: 137/81  118/83 118/83  Pulse: (!) 53  60 (!) 55  Resp: _0 Temp: 98 F (36.7 C)  98.4 F (36.9 C)   TempSrc: Oral  Oral   SpO2: 93% 95% 99% 91%  Weight:       Height:        Intake/Output Summary (Last 24 hours) at 01/07/2021 1755 Last data filed at 01/07/2021 1427 Gross per 24 hour  Intake --  Output 2800 ml  Net -2800 ml   Filed Weights   01/04/21 0402 01/05/21 0500 01/06/21 1542  Weight: 74.4 kg 75.1 kg 77.7 kg    Examination: General exam: Awake, laying in bed, in nad Respiratory system: Normal respiratory effort, no wheezing Cardiovascular system: regular rate, s1, s2 Gastrointestinal system: Soft, nondistended, positive BS Central nervous system: CN2-12 grossly intact, strength intact Extremities: Perfused, no clubbing Skin: Normal skin turgor, no notable skin lesions seen Psychiatry: Mood normal // no visual hallucinations   Data Reviewed: I have personally reviewed following labs and imaging studies  CBC: Recent Labs  Lab 01/02/21 1453 01/03/21 0300 01/04/21 0052 01/06/21 0212 01/07/21 0353 01/07/21 1327  WBC 13.2* 8.9 11.8* 9.0 10.5  --   NEUTROABS 10.9*  --   --   --   --   --   HGB 14.7 13.5 12.8 13.6 14.8 15.3*  15.3*  HCT 44.1 40.7 38.0 41.0 44.5 45.0  45.0  MCV 90.9 88.7 90.7 90.7 89.9  --   PLT 180 156 158 195 210  --    Basic Metabolic Panel: Recent Labs  Lab 01/03/21 0300 01/04/21 0052 01/05/21 0158 01/06/21 0212 01/07/21 0353 01/07/21 1327  NA 135 138 137 139 136 138  138  K 3.4* 5.0 3.3* 4.3 4.4 4.1  4.2  CL 94* 103 98 100 94*  --   CO2 _1 32  --   GLUCOSE 214* 134* 151* 154* 169*  --   BUN 22 24* 31* 35* 42*  --   CREATININE 1.20* 1.11* 1.31* 1.30* 1.54*  --   CALCIUM 9.4 9.3 8.8* 9.4 9.3  --    GFR: Estimated Creatinine Clearance: 31.4 mL/min (A) (by C-G formula based on SCr of 1.54 mg/dL (H)). Liver Function Tests: Recent Labs  Lab 01/02/21 1453 01/04/21 0052 01/06/21 0212 01/07/21 0353  AST 30 16 39 45*  ALT 20 15 40 59*  ALKPHOS 42 35* 40 42  BILITOT 1.6* 0.7 0.6 1.0  PROT 7.2 6.0* 6.6 6.5  ALBUMIN 3.9 3.3* 3.5 3.6   No results for input(s): LIPASE, AMYLASE in  the last 168 hours. No results for input(s): AMMONIA in the last 168 hours. Coagulation Profile: No results for input(s): INR, PROTIME in the last 168 hours. Cardiac Enzymes: No results for input(s): CKTOTAL, CKMB, CKMBINDEX, TROPONINI in the last 168 hours. BNP (last 3 results) No results for input(s): PROBNP in the last 8760 hours. HbA1C: No results for input(s): HGBA1C in the last 72 hours. CBG: No results for input(s): GLUCAP in the last 168 hours. Lipid Profile: No results for input(s): CHOL, HDL, LDLCALC, TRIG,  CHOLHDL, LDLDIRECT in the last 72 hours. Thyroid Function Tests: No results for input(s): TSH, T4TOTAL, FREET4, T3FREE, THYROIDAB in the last 72 hours. Anemia Panel: No results for input(s): VITAMINB12, FOLATE, FERRITIN, TIBC, IRON, RETICCTPCT in the last 72 hours. Sepsis Labs: No results for input(s): PROCALCITON, LATICACIDVEN in the last 168 hours.  Recent Results (from the past 240 hour(s))  Resp Panel by RT-PCR (Flu A&B, Covid) Nasopharyngeal Swab     Status: None   Collection Time: 01/02/21  3:55 PM   Specimen: Nasopharyngeal Swab; Nasopharyngeal(NP) swabs in vial transport medium  Result Value Ref Range Status   SARS Coronavirus 2 by RT PCR NEGATIVE NEGATIVE Final    Comment: (NOTE) SARS-CoV-2 target nucleic acids are NOT DETECTED.  The SARS-CoV-2 RNA is generally detectable in upper respiratory specimens during the acute phase of infection. The lowest concentration of SARS-CoV-2 viral copies this assay can detect is 138 copies/mL. A negative result does not preclude SARS-Cov-2 infection and should not be used as the sole basis for treatment or other patient management decisions. A negative result may occur with  improper specimen collection/handling, submission of specimen other than nasopharyngeal swab, presence of viral mutation(s) within the areas targeted by this assay, and inadequate number of viral copies(<138 copies/mL). A negative result must be  combined with clinical observations, patient history, and epidemiological information. The expected result is Negative.  Fact Sheet for Patients:  EntrepreneurPulse.com.au  Fact Sheet for Healthcare Providers:  IncredibleEmployment.be  This test is no t yet approved or cleared by the [place] FDA and  has been authorized for detection and/or diagnosis of SARS-CoV-2 by FDA under an Emergency Use Authorization (EUA). This EUA will remain  in effect (meaning this test can be used) for the duration of the COVID-19 declaration under Section 564(b)(1) of the Act, 21 U.S.C.section 360bbb-3(b)(1), unless the authorization is terminated  or revoked sooner.       Influenza A by PCR NEGATIVE NEGATIVE Final   Influenza B by PCR NEGATIVE NEGATIVE Final    Comment: (NOTE) The Xpert Xpress SARS-CoV-2/FLU/RSV plus assay is intended as an aid in the diagnosis of influenza from Nasopharyngeal swab specimens and should not be used as a sole basis for treatment. Nasal washings and aspirates are unacceptable for Xpert Xpress SARS-CoV-2/FLU/RSV testing.  Fact Sheet for Patients: EntrepreneurPulse.com.au  Fact Sheet for Healthcare Providers: IncredibleEmployment.be  This test is not yet approved or cleared by the [place] FDA and has been authorized for detection and/or diagnosis of SARS-CoV-2 by FDA under an Emergency Use Authorization (EUA). This EUA will remain in effect (meaning this test can be used) for the duration of the COVID-19 declaration under Section 564(b)(1) of the Act, 21 U.S.C. section 360bbb-3(b)(1), unless the authorization is terminated or revoked.  Performed at [facility], [place] [address], [place], [place] [address]      Radiology Studies: CARDIAC CATHETERIZATION  Result Date: 01/07/2021 1. Moderate pulmonary arterial hypertension, PVR 4.7 WU. 2. Preserved cardiac output. 3.  Normal/low filling pressures. Consider increasing back up on selexipag.    Scheduled Meds: . aspirin EC  81 mg Oral QHS  . atenolol  25 mg Oral QHS  . atenolol  50 mg Oral Daily  . enoxaparin (LOVENOX) injection  40 mg Subcutaneous Daily  . fluticasone furoate-vilanterol  1 puff Inhalation Daily   And  . umeclidinium bromide  1 puff Inhalation Daily  . furosemide  40 mg Intravenous Q12H  . gabapentin  300 mg Oral QHS  .  levothyroxine  112 mcg Oral QAC breakfast  . omega-3 acid ethyl esters  1 g Oral Daily  . pantoprazole  40 mg Oral Daily  . potassium chloride  10 mEq Oral Daily  . [START ON 01/08/2021] predniSONE  10 mg Oral Q breakfast  . rosuvastatin  20 mg Oral Daily  . Selexipag  1,400 mcg Oral BID  . sodium chloride flush  3 mL Intravenous Q12H  . tadalafil (PAH)  40 mg Oral Daily   Continuous Infusions:    LOS: 5 days   Marylu Lund, MD [facility] Pager On Amion  If 7PM-7AM, please contact night-coverage 01/07/2021, 5:55 PM

## 2021-01-07 NOTE — H&P (View-Only) (Deleted)
 NAME:  Kimberly Irwin, MRN:  [id number], DOB:  10/28/1945, LOS: 5 ADMISSION DATE:  01/02/2021, CONSULTATION DATE:  01/04/2021 REFERRING MD:  Choi - [facility] , CHIEF COMPLAINT:  SOB, hypoxia   History of Present Illness:  74 yo F PMH sarcoidosis (no active disease but remains on steroids for SOB), Emphysema, HFpEF , pulmonary HTN, Chronic hypoxic respiratory failure on 8LNC admitted to [facility] 6/1 with hypoxia after a fall from chair to knees, after which EMS was dispatched and brought pt to hospital due to hypoxia. Pt does endorse increasing SOB in the evening for about a week ever since her last Uptravi decrease. Also endorses an intermittent cough associated  with white mucus. Denied fever, chest pain, palpitations, sick contacts, new environmental exposures/allergens. CTA chest without evidence of PE. Exam findings most consistent with AECOPD, was started on solumedrol 6/2 and continued on BDs. COVID is negative, no RVP sent, not on abx. Since admission 6/1, O2 requirements have de-escalated from NRB , BiPAP to HFNC but have remained significantly higher than baseline. Currently on HFNC 80% FiO2 20 L/min PCCM consulted 6/3 for persistent hypoxia, inability to wean O2.     Of note, recent outpatient HRCT 10/08/20 with some fibrotic changes, paraseptal and centrilobular emphysema, diffuse bronchial wall thickening  In review of documentation from [facility] (where pt follows for pulmonary HTN) it looks like Uptravi was recently decreased from 2400 to 1700 to 1400 (last decrease approx 1week PTA)  Pertinent  Medical History  Sarcodisis  Pulmonary HTN HFpEF Emphysema  Chronic hypoxic respiratory failure  HTN HLD DDD S/p L3-4, 4-5posterior laminectomy, fusion, fixation GERD Hiatal hernia Chronic tremor  Hypothyroidism Significant Hospital Events: Including procedures, antibiotic start and stop dates in addition to other pertinent events   . 6/1 admitted to [facility] for acute on chronic hypoxia  . 6/3 still on  HFNC, PCCM consulted for inability to wean O2  . 6/4 25L, 70% FiO2 . 6/6 weaned to 10L [place], did ok low 90s on 8L  Interim History / Subjective:  She feels about the same, breathing is better. Discussed care with Dr. Al-Qadi at [facility].  Objective   Blood pressure 137/81, pulse (!) 53, temperature 98 F (36.7 C), temperature source Oral, resp. rate 18, height 5' 4" (1.626 m), weight 77.7 kg, SpO2 93 %.    FiO2 (%):  [50 %] 50 %   Intake/Output Summary (Last 24 hours) at 01/07/2021 1119 Last data filed at 01/07/2021 0337 Gross per 24 hour  Intake --  Output 2150 ml  Net -2150 ml   Filed Weights   01/04/21 0402 01/05/21 0500 01/06/21 1542  Weight: 74.4 kg 75.1 kg 77.7 kg    Examination: General: frail, chronically ill appearing woman lying in bed eating lunch HENT: [place]/AT, eyes anicteric Lungs: breathing comfortably on [place] without accessory muscle use Cardiovascular: S1S2, RRR Abdomen: soft, NT, ND  Extremities:  no ankle edema Neuro: head bobbing/oral tremor, answering questions appropriately, globally weak Derm: pallor, no rashes  Labs/imaging that I havepersonally reviewed  (right click and "Reselect all SmartList Selections" daily)  HRCT 10/08/20 with some fibrotic changes, paraseptal and centrilobular emphysema, diffuse bronchial wall thickening   Out pt autoimmune/rheum labs -- unremarkable except ANA positive, speckled 1:40  CTA chest 6/2> no PE. Tortuous vessels. Patent major airways, low lung volumes. Centrolobular emphysema. Stable nodules.   6/1 BNP 450 >> 513 on 6/4  BUN 31 Cr 1.31  Per [facility] records, prev PH workup: RHC:  11/2017: RA 2, RV   66/5, PA 66/16, mPAP 34, PAWP 3, PA saturation 60% AO 92% Cardiac Output (Fick) 3.73, Cardiac Index (Fick) 2.04, PVR 8.3 WU   05/2019: (mPAP 28 mmHg and PVR 2.75 WU)  V/Q scan: 12/2017: Normal perfusion lung scan. Minimally diminished ventilation to the RIGHT apex.  6MWT 05/17/19: ambulated 122 meters, required 6 L of  supplemental O2    Resolved Hospital Problem list     Assessment & Plan:   Acute on chronic hypoxic respiratory failure; suspect this is due to worsened PH as her pulm vasodilators have been recently decreased due to concern that their up-titration in the fall of 2020 made her worse.Leading to worsened RV pressure/volume overload and VQ mismatch. Suspect drops low at home leading to hyopoxemic pulmonary vasoconstriction further exacerbating things. Hypoxemia improved with diuresis. Emphysema Sarcoidosis  -autoimmune w/u unrevealing -ANA weakly positive, speckled -Esbreit not yet started, just got approved. 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 Our pharmacy does not stock this and it can only be obtained from specialty outpatient pharmacies.  -She was recently given prednisone taper- 20mg x 2 weeks, then 15mg x 1 week, 10mg daily -prev intolerant to macitentan -on tadalafil 40mg daily -BNP on admission 500   Plan: -Con't triple inhaled therapy -discontinue high dose solumedrol -prednisone 10 mg daily x 1 week then 5 mg x 1 week then off -con't tadalafil at PTA dose -continue uptravi 1400 mcg BID, RHC today, if PVR elevated or other hemos worse than prior would favor increasing -IS, mobility -- will be very important for this patient, need to get PT and really push mobility while inpatient given her baseline limited mobility  -Wean O2 as able  We will continue to follow.  Best practice (right click and "Reselect all SmartList Selections" daily)  Per  primary   Amarrion Pastorino R Anam Bobby, MD  01/07/21 11:19 AM [facility] See Amion for contact info  

## 2021-01-07 NOTE — Interval H&P Note (Deleted)
History and Physical Interval Note:  01/07/2021 1:08 PM  Kimberly Irwin  has presented today for surgery, with the diagnosis of HF.  The various methods of treatment have been discussed with the patient and family. After consideration of risks, benefits and other options for treatment, the patient has consented to  Procedure(s): RIGHT HEART CATH (N/A) as a surgical intervention.  The patient's history has been reviewed, patient examined, no change in status, stable for surgery.  I have reviewed the patient's chart and labs.  Questions were answered to the patient's satisfaction.     [name] [employer]

## 2021-01-08 ENCOUNTER — Telehealth: Payer: Self-pay | Admitting: Pulmonary Disease

## 2021-01-08 LAB — COMPREHENSIVE METABOLIC PANEL
ALT: 47 U/L — ABNORMAL HIGH (ref 0–44)
AST: 33 U/L (ref 15–41)
Albumin: 3.5 g/dL (ref 3.5–5.0)
Alkaline Phosphatase: 43 U/L (ref 38–126)
Anion gap: 10 (ref 5–15)
BUN: 49 mg/dL — ABNORMAL HIGH (ref 8–23)
CO2: 33 mmol/L — ABNORMAL HIGH (ref 22–32)
Calcium: 9.2 mg/dL (ref 8.9–10.3)
Chloride: 95 mmol/L — ABNORMAL LOW (ref 98–111)
Creatinine, Ser: 1.37 mg/dL — ABNORMAL HIGH (ref 0.44–1.00)
GFR, Estimated: 40 mL/min — ABNORMAL LOW (ref >60)
Glucose, Bld: 114 mg/dL — ABNORMAL HIGH (ref 70–99)
Potassium: 3.6 mmol/L (ref 3.5–5.1)
Sodium: 138 mmol/L (ref 135–145)
Total Bilirubin: 1 mg/dL (ref 0.3–1.2)
Total Protein: 6.7 g/dL (ref 6.5–8.1)

## 2021-01-08 MED ORDER — SELEXIPAG 1400 MCG PO TABS: 1600.0000 ug | ORAL_TABLET | Freq: Two times a day (BID) | ORAL | Status: DC

## 2021-01-08 MED ORDER — SELEXIPAG 1400 MCG PO TABS
1400.0000 ug | ORAL_TABLET | Freq: Two times a day (BID) | ORAL | Status: AC
  Administered 2021-01-08 – 2021-01-11 (×5): 1400 ug via ORAL
  Filled 2021-01-08 (×7): qty 1

## 2021-01-08 NOTE — Plan of Care (Signed)
  Problem: Elimination: Goal: Will not experience complications related to urinary retention Outcome: Progressing   Problem: Pain Managment: Goal: General experience of comfort will improve Outcome: Progressing   Problem: Safety: Goal: Ability to remain free from injury will improve Outcome: Progressing

## 2021-01-08 NOTE — Telephone Encounter (Signed)
Please schedule patient for hospital follow up with Dr. Silas Flood on 6/23 for pulmonary hypertension.  Thanks, Wille Glaser

## 2021-01-08 NOTE — Telephone Encounter (Signed)
Noted and done. Thanks!

## 2021-01-08 NOTE — Progress Notes (Signed)
Physical Therapy Treatment Patient Details Name: Kimberly Irwin MRN: [id number] DOB: Dec 31, 1945 Today's Date: 01/08/2021    History of Present Illness 75 y.o. female admitted 6/1 who presents for evaluation of worsening SOB. She presented by EMS on NRB and was started on Bipap in the ER. She was weaned to heated HFNC and then to HFNC by 6/6.  PMH:  COPD, sarcoidosis, HTN, HLD, HFpEF, cardiomegaly, chronic respiratory failure with hypoxia on oxygen 7-8 L/minNC at home    PT Comments    Patient received in bed, playing games on tablet. She is agreeable to PT session. Patient is mod independent/independent with bed mobility. Requires assist for lines only. Min assist for sit to stand and to obtain standing balance. Posterior leaning in standing requiring cues to steady self. Initially ambulating with poor balance and RW, balance improved with increased distance, min guard. She was able to ambulate two laps from bed to door. Limited by fatigue and O2 saturations with ambulation into low 80%s on 15l HFNC.  Patient will continue to benefit from skilled PT while here to improve functional independence and activity tolerance.        Follow Up Recommendations  SNF     Equipment Recommendations  None recommended by PT    Recommendations for Other Services       Precautions / Restrictions Precautions Precautions: Fall Restrictions Weight Bearing Restrictions: No    Mobility  Bed Mobility Overal bed mobility: Independent                  Transfers Overall transfer level: Needs assistance Equipment used: Rolling walker (2 wheeled) Transfers: Sit to/from Stand Sit to Stand: Min assist         General transfer comment: min assist to power up, posterior leaning with initial standing. Cues needed to lean forward onto walker.  Ambulation/Gait Ambulation/Gait assistance: Min guard Gait Distance (Feet): 40 Feet Assistive device: Rolling walker (2 wheeled) Gait Pattern/deviations:  Step-through pattern;Decreased stride length;Narrow base of support Gait velocity: decr   General Gait Details: Patient ambulated on 15 lpm HFNC. O2 sats ranged from 90-83% with mobility. Cues needed to breath deep in through nose. Patient reports she is a mouth breather. Patient became more steady with mobility with increased distance. Initially unsteady with very narrow bos.   Stairs             Wheelchair Mobility    Modified Rankin (Stroke Patients Only)       Balance Overall balance assessment: Needs assistance Sitting-balance support: Feet supported Sitting balance-Leahy Scale: Good   Postural control: Posterior lean Standing balance support: Bilateral upper extremity supported;During functional activity Standing balance-Leahy Scale: Poor Standing balance comment: relies on UE support, requiried assist for initial standing balance.                            Cognition Arousal/Alertness: Awake/alert Behavior During Therapy: WFL for tasks assessed/performed Overall Cognitive Status: Within Functional Limits for tasks assessed                                        Exercises Other Exercises Other Exercises: seated LE exercises: LAQ, marching, ap x 10 reps each    General Comments        Pertinent Vitals/Pain Pain Assessment: Faces Faces Pain Scale: Hurts little more Pain Location: back Pain Descriptors /  Indicators: Discomfort Pain Intervention(s): Monitored during session    Home Living                      Prior Function            PT Goals (current goals can now be found in the care plan section) Acute Rehab PT Goals Patient Stated Goal: to go home PT Goal Formulation: With patient Time For Goal Achievement: 01/18/21 Potential to Achieve Goals: Good Progress towards PT goals: Progressing toward goals    Frequency    Min 3X/week      PT Plan Current plan remains appropriate    Co-evaluation               AM-PAC PT "6 Clicks" Mobility   Outcome Measure  Help needed turning from your back to your side while in a flat bed without using bedrails?: None Help needed moving from lying on your back to sitting on the side of a flat bed without using bedrails?: None Help needed moving to and from a bed to a chair (including a wheelchair)?: A Little Help needed standing up from a chair using your arms (e.g., wheelchair or bedside chair)?: A Little Help needed to walk in hospital room?: A Little Help needed climbing 3-5 steps with a railing? : A Lot 6 Click Score: 19    End of Session Equipment Utilized During Treatment: Gait belt;Oxygen Activity Tolerance: Patient limited by fatigue Patient left: in chair;with call bell/phone within reach;with chair alarm set Nurse Communication: Mobility status PT Visit Diagnosis: Unsteadiness on feet (R26.81);Muscle weakness (generalized) (M62.81)     Time: 7998-7215 PT Time Calculation (min) (ACUTE ONLY): 23 min  Charges:  $Gait Training: 8-22 mins $Therapeutic Exercise: 8-22 mins                     [employer], PT, [facility] 01/08/21,11:06 AM

## 2021-01-08 NOTE — Progress Notes (Addendum)
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 Pt does endorse increasing SOB in the evening for about a week ever since her last Uptravi decrease. Also endorses an intermittent cough associated  with white mucus. Denied fever, chest pain, palpitations, sick contacts, new environmental exposures/allergens. CTA chest without evidence of PE. Exam findings most consistent with AECOPD, was started on solumedrol 6/2 and continued on BDs. COVID is negative, no RVP sent, not on abx. Since admission 6/1, O2 requirements have de-escalated from NRB , BiPAP to HFNC but have remained significantly higher than baseline. Currently on HFNC 80% FiO2 20 L/min PCCM consulted 6/3 for persistent hypoxia, inability to wean O2.     Of note, recent outpatient HRCT 10/08/20 with some fibrotic changes, paraseptal and centrilobular emphysema, diffuse bronchial wall thickening  In review of documentation from [facility] (where pt follows for pulmonary HTN) it looks like Uptravi was recently decreased from 2400 to 1700 to 1400 (last decrease approx 1week PTA)    Pertinent  Medical History  Sarcodisis  Pulmonary HTN HFpEF Emphysema  Chronic hypoxic respiratory failure  HTN HLD DDD S/p L3-4, 4-5posterior laminectomy, fusion, fixation GERD Hiatal hernia Chronic tremor  Hypothyroidism Significant Hospital Events: Including procedures, antibiotic start and stop dates in addition to other pertinent events   . 6/1 admitted to [facility] for acute on chronic hypoxia  . 6/3 still on  HFNC, PCCM consulted for inability to wean O2  . 6/4 25L, 70% FiO2 . 6/6 weaned to 10L [place], did ok low 90s on 8L. RHC showed PVR 4.7 WU, CO and CI preserved, normal filling pressures.  Interim History / Subjective:   She is doing ok this morning. She denies any issues at this time. Her night was uneventful. O2 is at 10L.   Objective   Blood pressure 114/84, pulse (!) 59, temperature 98.2 F (36.8 C), temperature source Oral, resp. rate 12, height _0  (1.626 m), weight 77.7 kg, SpO2 92 %.        Intake/Output Summary (Last 24 hours) at 01/08/2021 1009 Last data filed at 01/08/2021 0410 Gross per 24 hour  Intake --  Output 1600 ml  Net -1600 ml   Filed Weights   01/04/21 0402 01/05/21 0500 01/06/21 1542  Weight: 74.4 kg 75.1 kg 77.7 kg    Examination: General: frail, chronically ill appearing woman lying in bed HENT: [place]/AT, eyes anicteric, moist mucous membranes Lungs: breathing comfortably on [place] without accessory muscle use Cardiovascular: S1S2, RRR Abdomen: soft, non-tender, non-distended  Extremities:  No edema, warm Neuro: head bobbing/oral tremor, answering questions appropriately, globally weak Derm: pallor, no rashes  Labs/imaging that I have personally reviewed  (right click and "Reselect all SmartList Selections" daily)  HRCT 10/08/20 with some fibrotic changes, paraseptal and centrilobular emphysema, diffuse bronchial wall thickening   Out pt autoimmune/rheum labs -- unremarkable except ANA positive, speckled 1:40  CTA chest 6/2> no PE. Tortuous vessels. Patent major airways, low lung volumes. Centrolobular emphysema. Stable nodules.   6/1 BNP 450 >> 513 on  6/4  BUN 31 Cr 1.31  Per [facility] records, prev PH workup: RHC:  11/2017: RA 2, RV 66/5, PA 66/16, mPAP 34, PAWP 3, PA saturation 60% AO 92% Cardiac Output (Fick) 3.73, Cardiac Index (Fick) 2.04, PVR 8.3 WU   05/2019: (mPAP 28 mmHg and PVR 2.75 WU)  V/Q scan: 12/2017: Normal perfusion lung scan. Minimally  diminished ventilation to the RIGHT apex.  6MWT 05/17/19: ambulated 122 meters, required 6 L of supplemental O2    Resolved Hospital Problem list     Assessment & Plan:   Acute on chronic hypoxic respiratory failure; suspect this is due to worsened PH as her pulm vasodilators have been recently decreased due to concern that their up-titration in the fall of 2020 made her worse.Leading to worsened RV pressure/volume overload and VQ mismatch. Suspect drops low at home leading to hyopoxemic pulmonary vasoconstriction further exacerbating things. Hypoxemia improved with diuresis. Emphysema Sarcoidosis  -autoimmune w/u unrevealing -ANA weakly positive, speckled -Esbreit not yet started, just got approved. 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 Our pharmacy does not stock this and it can only be obtained from specialty outpatient pharmacies.  -She was recently given prednisone taper- 98m x 2 weeks, then 176mx 1 week, 1066maily -prev intolerant to macitentan -on tadalafil 29m29mily -BNP on admission 500   Plan: - Con't triple inhaled therapy - discontinue high dose solumedrol - prednisone 10 mg daily x 1 week then 5 mg x 1 week then off - con't tadalafil at PTA dose - RHC yesterday, PVR 4.7 WU, CO and CI preserved, normal filling pressures. - UpMalvin Johnsreased to [id number] 1400 mcg BID today. Will plan to stay on this dose until further outpatient follow up. -IS, mobility - need to get PT and really push mobility while inpatient given her baseline limited mobility   -Wean O2 as able  We will continue to follow.   JonatFreda JacksonLeBauNorth [facility] Office: [phone] Amion for personal pager PCCM on call pager [phone] 7pm. Please call Elink 7p-7a. [phone]

## 2021-01-08 NOTE — Progress Notes (Signed)
PROGRESS NOTE    Kimberly Irwin  GLO:[id number] DOB: 1946/04/17 DOA: 01/02/2021 PCP: Mosie Lukes, MD    Brief Narrative:  75 y.o. female with medical history significant for COPD, sarcoidosis, HTN, HLD, HFpEF, cardiomegaly, chronic respiratory failure with hypoxia on oxygen at 7-8 L/min by [place] at home who presents for evaluation of worsening SOB. She presented by EMS on NRB and was started on Bipap in the ER. She has now been weaned to [facility]   Assessment & Plan:   Principal Problem:   Acute on chronic respiratory failure with hypoxia (HCC) Active Problems:   Sarcoidosis stage I   Hyperlipidemia   Essential hypertension   Hypothyroidism   Cardiomegaly   Pulmonary HTN (HCC)   Chronic heart failure with preserved ejection fraction (HFpEF) (HCC)   COPD with acute exacerbation (HCC)   Prolonged QT interval  Principal Problem:   Acute on chronic respiratory failure with hypoxia  -Baseline O2 is around 8L,peak of requiring 30L, now down to 12L this AM -Presenting CT chest neg for PE -Outpt high res CT on 3/22 was concerning for fibrotic changes categorized as indeterminate for usual  -PCCM following. Had been on IV solumedrol. IV solumedrol d/c today with prednisone taper ordered by PCCM -Continue diuresis as tolerated -Pt is now s/p RHC with PVR 4.7, recs to increase Uptravi to 1683m BID at time of d/c -Wean steroids per PCCM -Per PCCM, anticipate d/c when pt can be weaned close to her baseline of 8L  Active Problems:   COPD, chronic -Continued on scheduled bronchodilator -Was initially continued on on [id number] solumedrol, per above, now on PO prednisone taper  Patient is currently on high flow nasal cannula and maintaining O2 saturation.   -She normally uses oxygen at 7 to 8 L/min, needing up to 30L high flow, down to 12L this AM, cont to wean O2 -continue to wean O2 as tolerated    Essential hypertension -Continue home dose of atenolol twice a day -BP remains  stable    Cardiomegaly -Chronic.  Was September 2020 which showed LVH with EF of 60 to 65% with abnormal diastolic function -2d echo reviewed, EF 60-65% noted -Now s/p RHC per above    Pulmonary HTN  -Continue tadalafil as pt tolerates -Likely primary source of presenting hypoxemic failure -Pt is s/p RHC. PCCM rec to increase Uptravi ato [id number] time of d/c    Chronic heart failure with preserved ejection fraction (HFpEF)  -No signs of fluid overload at this time.   -Patient does have cardiomegaly on chest x-ray. -2d echo with preserved EF noted, Pt is s/p RHC 6/6    Sarcoidosis stage I -Pt had been on chronic 5mg34mednisone, per above -IV solumedrol has been d/c by PCCM, now on prednisone taper -Appreciate input by PCCM    Hyperlipidemia -Continue home dose of Crestor.   -Continue coenzyme Q 10 that she takes at home    Hypothyroidism -continue with levothyroxine as tolerated    Prolonged QT interval -Thus far stable on tele  T12 Compression fracture -Noted on imaging, following falls at home -Pt appears comfortable -Cont PT as tolerated. Recs for SNF noted however pt declined SNF. Plan HH at time of d/c  DVT prophylaxis: Lovenox subq Code Status: DNR Family Communication: Pt in room, family not at bedside  Status is: Inpatient  Remains inpatient appropriate because:Inpatient level of care appropriate due to severity of illness   Dispo: The patient is from: Home  Anticipated d/c is to: Home              Patient currently is not medically stable to d/c.   Difficult to place patient No   Consultants:   Pulmonary  Cardiology  Procedures:     Antimicrobials: Anti-infectives (From admission, onward)   None      Subjective: Without complaints this AM. Hoping to go home soon  Objective: Vitals:   01/08/21 0729 01/08/21 0856 01/08/21 0920 01/08/21 1057  BP:  114/84    Pulse:  (!) 55 (!) 59   Resp:  12    Temp:  98.2 F (36.8  C)    TempSrc:  Oral    SpO2: 92% 92%  90%  Weight:      Height:        Intake/Output Summary (Last 24 hours) at 01/08/2021 1549 Last data filed at 01/08/2021 0410 Gross per 24 hour  Intake --  Output 700 ml  Net -700 ml   Filed Weights   01/04/21 0402 01/05/21 0500 01/06/21 1542  Weight: 74.4 kg 75.1 kg 77.7 kg    Examination: General exam: Conversant, in no acute distress Respiratory system: normal chest rise, clear, no audible wheezing Cardiovascular system: regular rhythm, s1-s2 Gastrointestinal system: Nondistended, nontender, pos BS Central nervous system: No seizures, no tremors Extremities: No cyanosis, no joint deformities Skin: No rashes, no pallor Psychiatry: Affect normal // no auditory hallucinations   Data Reviewed: I have personally reviewed following labs and imaging studies  CBC: Recent Labs  Lab 01/02/21 1453 01/03/21 0300 01/04/21 0052 01/06/21 0212 01/07/21 0353 01/07/21 1327  WBC 13.2* 8.9 11.8* 9.0 10.5  --   NEUTROABS 10.9*  --   --   --   --   --   HGB 14.7 13.5 12.8 13.6 14.8 15.3*  15.3*  HCT 44.1 40.7 38.0 41.0 44.5 45.0  45.0  MCV 90.9 88.7 90.7 90.7 89.9  --   PLT 180 156 158 195 210  --    Basic Metabolic Panel: Recent Labs  Lab 01/04/21 0052 01/05/21 0158 01/06/21 0212 01/07/21 0353 01/07/21 1327 01/08/21 0359  NA 138 137 139 136 138  138 138  K 5.0 3.3* 4.3 4.4 4.1  4.2 3.6  CL 103 98 100 94*  --  95*  CO2 _0 32  --  33*  GLUCOSE 134* 151* 154* 169*  --  114*  BUN 24* 31* 35* 42*  --  49*  CREATININE 1.11* 1.31* 1.30* 1.54*  --  1.37*  CALCIUM 9.3 8.8* 9.4 9.3  --  9.2   GFR: Estimated Creatinine Clearance: 35.2 mL/min (A) (by C-G formula based on SCr of 1.37 mg/dL (H)). Liver Function Tests: Recent Labs  Lab 01/02/21 1453 01/04/21 0052 01/06/21 0212 01/07/21 0353 01/08/21 0359  AST 30 16 39 45* 33  ALT 20 15 40 59* 47*  ALKPHOS 42 35* 40 42 43  BILITOT 1.6* 0.7 0.6 1.0 1.0  PROT 7.2 6.0* 6.6 6.5 6.7   ALBUMIN 3.9 3.3* 3.5 3.6 3.5   No results for input(s): LIPASE, AMYLASE in the last 168 hours. No results for input(s): AMMONIA in the last 168 hours. Coagulation Profile: No results for input(s): INR, PROTIME in the last 168 hours. Cardiac Enzymes: No results for input(s): CKTOTAL, CKMB, CKMBINDEX, TROPONINI in the last 168 hours. BNP (last 3 results) No results for input(s): PROBNP in the last 8760 hours. HbA1C: No results for input(s): HGBA1C in the last 72 hours.  CBG: No results for input(s): GLUCAP in the last 168 hours. Lipid Profile: No results for input(s): CHOL, HDL, LDLCALC, TRIG, CHOLHDL, LDLDIRECT in the last 72 hours. Thyroid Function Tests: No results for input(s): TSH, T4TOTAL, FREET4, T3FREE, THYROIDAB in the last 72 hours. Anemia Panel: No results for input(s): VITAMINB12, FOLATE, FERRITIN, TIBC, IRON, RETICCTPCT in the last 72 hours. Sepsis Labs: No results for input(s): PROCALCITON, LATICACIDVEN in the last 168 hours.  Recent Results (from the past 240 hour(s))  Resp Panel by RT-PCR (Flu A&B, Covid) Nasopharyngeal Swab     Status: None   Collection Time: 01/02/21  3:55 PM   Specimen: Nasopharyngeal Swab; Nasopharyngeal(NP) swabs in vial transport medium  Result Value Ref Range Status   SARS Coronavirus 2 by RT PCR NEGATIVE NEGATIVE Final    Comment: (NOTE) SARS-CoV-2 target nucleic acids are NOT DETECTED.  The SARS-CoV-2 RNA is generally detectable in upper respiratory specimens during the acute phase of infection. The lowest concentration of SARS-CoV-2 viral copies this assay can detect is 138 copies/mL. A negative result does not preclude SARS-Cov-2 infection and should not be used as the sole basis for treatment or other patient management decisions. A negative result may occur with  improper specimen collection/handling, submission of specimen other than nasopharyngeal swab, presence of viral mutation(s) within the areas targeted by this assay, and  inadequate number of viral copies(<138 copies/mL). A negative result must be combined with clinical observations, patient history, and epidemiological information. The expected result is Negative.  Fact Sheet for Patients:  EntrepreneurPulse.com.au  Fact Sheet for Healthcare Providers:  IncredibleEmployment.be  This test is no t yet approved or cleared by the [place] FDA and  has been authorized for detection and/or diagnosis of SARS-CoV-2 by FDA under an Emergency Use Authorization (EUA). This EUA will remain  in effect (meaning this test can be used) for the duration of the COVID-19 declaration under Section 564(b)(1) of the Act, 21 U.S.C.section 360bbb-3(b)(1), unless the authorization is terminated  or revoked sooner.       Influenza A by PCR NEGATIVE NEGATIVE Final   Influenza B by PCR NEGATIVE NEGATIVE Final    Comment: (NOTE) The Xpert Xpress SARS-CoV-2/FLU/RSV plus assay is intended as an aid in the diagnosis of influenza from Nasopharyngeal swab specimens and should not be used as a sole basis for treatment. Nasal washings and aspirates are unacceptable for Xpert Xpress SARS-CoV-2/FLU/RSV testing.  Fact Sheet for Patients: EntrepreneurPulse.com.au  Fact Sheet for Healthcare Providers: IncredibleEmployment.be  This test is not yet approved or cleared by the [place] FDA and has been authorized for detection and/or diagnosis of SARS-CoV-2 by FDA under an Emergency Use Authorization (EUA). This EUA will remain in effect (meaning this test can be used) for the duration of the COVID-19 declaration under Section 564(b)(1) of the Act, 21 U.S.C. section 360bbb-3(b)(1), unless the authorization is terminated or revoked.  Performed at [facility], [place] [address] [place], [place] [address]      Radiology Studies: CARDIAC CATHETERIZATION  Result Date: 01/07/2021 1. Moderate  pulmonary arterial hypertension, PVR 4.7 WU. 2. Preserved cardiac output. 3. Normal/low filling pressures. Consider increasing back up on selexipag.    Scheduled Meds: . aspirin EC  81 mg Oral QHS  . atenolol  25 mg Oral QHS  . atenolol  50 mg Oral Daily  . enoxaparin (LOVENOX) injection  40 mg Subcutaneous Daily  . fluticasone furoate-vilanterol  1 puff Inhalation Daily   And  . umeclidinium bromide  1 puff Inhalation Daily  . furosemide  40 mg Intravenous Q12H  . gabapentin  300 mg Oral QHS  . levothyroxine  112 mcg Oral QAC breakfast  . omega-3 acid ethyl esters  1 g Oral Daily  . pantoprazole  40 mg Oral Daily  . potassium chloride  10 mEq Oral Daily  . predniSONE  10 mg Oral Q breakfast  . rosuvastatin  20 mg Oral Daily  . Selexipag  1,400 mcg Oral BID  . sodium chloride flush  3 mL Intravenous Q12H  . tadalafil (PAH)  40 mg Oral Daily   Continuous Infusions:    LOS: 6 days   Marylu Lund, MD [facility] Pager On Amion  If 7PM-7AM, please contact night-coverage 01/08/2021, 3:49 PM

## 2021-01-09 ENCOUNTER — Telehealth: Payer: Self-pay | Admitting: Pharmacist

## 2021-01-09 LAB — COMPREHENSIVE METABOLIC PANEL
ALT: 43 U/L (ref 0–44)
AST: 30 U/L (ref 15–41)
Albumin: 3.4 g/dL — ABNORMAL LOW (ref 3.5–5.0)
Alkaline Phosphatase: 44 U/L (ref 38–126)
Anion gap: 11 (ref 5–15)
BUN: 51 mg/dL — ABNORMAL HIGH (ref 8–23)
CO2: 31 mmol/L (ref 22–32)
Calcium: 8.9 mg/dL (ref 8.9–10.3)
Chloride: 95 mmol/L — ABNORMAL LOW (ref 98–111)
Creatinine, Ser: 1.31 mg/dL — ABNORMAL HIGH (ref 0.44–1.00)
GFR, Estimated: 42 mL/min — ABNORMAL LOW (ref >60)
Glucose, Bld: 103 mg/dL — ABNORMAL HIGH (ref 70–99)
Potassium: 3.3 mmol/L — ABNORMAL LOW (ref 3.5–5.1)
Sodium: 137 mmol/L (ref 135–145)
Total Bilirubin: 0.9 mg/dL (ref 0.3–1.2)
Total Protein: 6.3 g/dL — ABNORMAL LOW (ref 6.5–8.1)

## 2021-01-09 MED ORDER — POTASSIUM CHLORIDE 20 MEQ PO PACK
60.0000 meq | PACK | Freq: Once | ORAL | Status: AC
  Administered 2021-01-09: 60 meq via ORAL
  Filled 2021-01-09: qty 3

## 2021-01-09 MED ORDER — GUAIFENESIN-DM 100-10 MG/5ML PO SYRP
5.0000 mL | ORAL_SOLUTION | ORAL | Status: DC | PRN
  Administered 2021-01-09 – 2021-01-13 (×3): 5 mL via ORAL
  Filled 2021-01-09 (×3): qty 5

## 2021-01-09 NOTE — Progress Notes (Signed)
NAME:  Kimberly Irwin, MRN:  [id number], DOB:  11-12-1945, LOS: 7 ADMISSION DATE:  01/02/2021, CONSULTATION DATE:  01/04/2021 REFERRING MD:  Maylene Roes - TRH , CHIEF COMPLAINT:  SOB, hypoxia   History of Present Illness:  75 yo F PMH sarcoidosis (no active disease but remains on steroids for SOB), Emphysema, HFpEF , pulmonary HTN, Chronic hypoxic respiratory failure on 8LNC admitted to [facility] 6/1 with hypoxia after a fall from chair to knees, after which EMS was dispatched and brought pt to hospital due to hypoxia. Pt does endorse increasing SOB in the evening for about a week ever since her last Uptravi decrease. Also endorses an intermittent cough associated  with white mucus. Denied fever, chest pain, palpitations, sick contacts, new environmental exposures/allergens. CTA chest without evidence of PE. Exam findings most consistent with AECOPD, was started on solumedrol 6/2 and continued on BDs. COVID is negative, no RVP sent, not on abx. Since admission 6/1, O2 requirements have de-escalated from NRB , BiPAP to HFNC but have remained significantly higher than baseline. Currently on HFNC 80% FiO2 20 L/min PCCM consulted 6/3 for persistent hypoxia, inability to wean O2.     Of note, recent outpatient HRCT 10/08/20 with some fibrotic changes, paraseptal and centrilobular emphysema, diffuse bronchial wall thickening  In review of documentation from [facility] (where pt follows for pulmonary HTN) it looks like Uptravi was recently decreased from 2400 to 1700 to 1400 (last decrease approx 1week PTA)    Pertinent  Medical History  Sarcodisis  Pulmonary HTN HFpEF Emphysema  Chronic hypoxic respiratory failure  HTN HLD DDD S/p L3-4, 4-5posterior laminectomy, fusion, fixation GERD Hiatal hernia Chronic tremor  Hypothyroidism Significant Hospital Events: Including procedures, antibiotic start and stop dates in addition to other pertinent events   . 6/1 admitted to [facility] for acute on chronic hypoxia  . 6/3 still on  HFNC, PCCM consulted for inability to wean O2  . 6/4 25L, 70% FiO2 . 6/6 weaned to 10L [place], did ok low 90s on 8L. RHC showed PVR 4.7 WU, CO and CI preserved, normal filling pressures.  Interim History / Subjective:   She is doing ok this morning. She denies any issues at this time. Her night was uneventful. O2 is at 13L this morning.   Objective   Blood pressure 100/69, pulse 63, temperature 97.9 F (36.6 C), temperature source Oral, resp. rate 16, height _0  (1.626 m), weight 77.7 kg, SpO2 95 %.        Intake/Output Summary (Last 24 hours) at 01/09/2021 1119 Last data filed at 01/09/2021 0920 Gross per 24 hour  Intake 3 ml  Output 1000 ml  Net -997 ml   Filed Weights   01/04/21 0402 01/05/21 0500 01/06/21 1542  Weight: 74.4 kg 75.1 kg 77.7 kg    Examination: General: frail, chronically ill appearing woman lying in bed HENT: [place]/AT, eyes anicteric, moist mucous membranes Lungs: clear to auscultation bilaterally, no wheezing or rales. Cardiovascular: S1S2, RRR Abdomen: soft, non-tender, non-distended  Extremities:  No edema, warm Neuro: head bobbing/oral tremor, answering questions appropriately, globally weak Derm: pallor, no rashes  Labs/imaging that I have personally reviewed  (right click and "Reselect all SmartList Selections" daily)  HRCT 10/08/20 with some fibrotic changes, paraseptal and centrilobular emphysema, diffuse bronchial wall thickening   Out pt autoimmune/rheum labs -- unremarkable except ANA positive, speckled 1:40  CTA chest 6/2> no PE. Tortuous vessels. Patent major airways, low lung volumes. Centrolobular emphysema. Stable nodules.   6/1 BNP 450 >>  513 on 6/4  6/8 BUN 51 Cr 1.31  Per [facility] records, prev PH workup: RHC:  11/2017: RA 2, RV 66/5, PA 66/16, mPAP 34, PAWP 3, PA saturation 60% AO 92% Cardiac Output (Fick) 3.73, Cardiac Index (Fick) 2.04, PVR 8.3 WU   05/2019: (mPAP 28 mmHg and PVR 2.75 WU)  V/Q scan: 12/2017: Normal perfusion lung  scan. Minimally diminished ventilation to the RIGHT apex.  6MWT 05/17/19: ambulated 122 meters, required 6 L of supplemental O2    Resolved Hospital Problem list     Assessment & Plan:   Acute on chronic hypoxic respiratory failure; suspect this is due to worsened PH as her pulm vasodilators have been recently decreased due to concern that their up-titration in the fall of 2020 made her worse.Leading to worsened RV pressure/volume overload and VQ mismatch. Suspect drops low at home leading to hyopoxemic pulmonary vasoconstriction further exacerbating things. Hypoxemia improved with diuresis. Emphysema Sarcoidosis  -autoimmune w/u unrevealing -ANA weakly positive, speckled -Esbreit not yet started, just got approved. 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 Our pharmacy does not stock this and it can only be obtained from specialty outpatient pharmacies.  -She was recently given prednisone taper- 55m x 2 weeks, then 120mx 1 week, 108maily -prev intolerant to macitentan -on tadalafil 24m72mily -BNP on admission 500   Plan: - Con't triple inhaled therapy - discontinue high dose solumedrol - prednisone 10 mg daily x 1 week then 5 mg x 1 week then off - con't tadalafil at PTA dose - Continue lasix 24mg70mBID - RHC 6/6 PVR 4.7 WU, CO and CI preserved, normal filling pressures. - Continue Uptravi 1400 mcg BID. I [place] at [id number]. Patient's daughter will bring the medication in when it is delivered to her home.  -IS, mobility - need to  get PT and really push mobility while inpatient given her baseline limited mobility  -Wean O2 as able  We will continue to follow.   JonathFreda [facility] Office: [phone] Amion for personal pager PCCM on call pager [phone] 7pm. Please call Elink 7p-7a. [phone]

## 2021-01-09 NOTE — Progress Notes (Signed)
PROGRESS NOTE    Kimberly Irwin  LZJ:[id number] DOB: February 10, 1946 DOA: 01/02/2021 PCP: Mosie Lukes, MD   Brief Narrative:  75 y.o.femalewith medical history significant forCOPD, sarcoidosis, HTN, HLD, HFpEF, cardiomegaly, chronic respiratory failure with hypoxia on oxygen at 7-8 L/min by [place] at home who presents for evaluation of worsening SOB. She presented by EMS on NRB and was started on Bipap in the ER.  Symptoms are combination of worsening pulmonary hypertension and COPD exacerbation.  Pulmonary team is following.   Assessment & Plan:   Principal Problem:   Acute on chronic respiratory failure with hypoxia (HCC) Active Problems:   Sarcoidosis stage I   Hyperlipidemia   Essential hypertension   Hypothyroidism   Cardiomegaly   Pulmonary HTN (HCC)   Chronic heart failure with preserved ejection fraction (HFpEF) (HCC)   COPD with acute exacerbation (HCC)   Prolonged QT interval   Acute on chronic respiratory failure with hypoxia, 13 L high flow -Symptoms are combination of worsening pulmonary hypertension, COPD exacerbation and underlying fibrosis.  Supplemental oxygen.  Baseline oxygen 8 L nasal cannula - Pulmonary team following - Continue bronchodilators, I-S/flutter, out of bed to chair - Uptarvi per PCCN - Tadalafil - Prednisone 10 mg daily for 1 week followed by 5 mg for 1 week then off - Follow-up outpatient pulmonary.  COPD, chronic -Continue bronchodilators and prednisone as prescribed  Essential hypertension -Atenolol  Pulmonary HTN  -Status post RHC.  Continue tadalafil and Uptarvi  Chronic heart failure with preserved ejection fraction (HFpEF)  -No signs of fluid overload at this time.  -Patient does have cardiomegaly on chest x-ray. -2d echo with preserved EF noted, Pt is s/p RHC 6/6  Sarcoidosis stage I -On chronic prednisone at home.  Following PCCM.  Hyperlipidemia -Continue Crestor  Hypothyroidism -Daily  Synthroid  T12 Compression fracture -Pain controlled.  PT recommended SNF, patient declined.  Will arrange for home health  DVT prophylaxis: Lovenox Code Status: DNR Family Communication:    Status is: Inpatient  Remains inpatient appropriate because:Inpatient level of care appropriate due to severity of illness   Dispo: The patient is from: Home              Anticipated d/c is to: Home              Patient currently is not medically stable to d/c.  Patient is still on 13 L high flow nasal cannula, unsafe for discharge   Difficult to place patient No    Subjective: At rest she feels okay but with movement she does have exertional shortness of breath.  Review of Systems Otherwise negative except as per HPI, including: General: Denies fever, chills, night sweats or unintended weight loss. Resp: Denies hemoptysis Cardiac: Denies chest pain, palpitations, orthopnea, paroxysmal nocturnal dyspnea. GI: Denies abdominal pain, nausea, vomiting, diarrhea or constipation GU: Denies dysuria, frequency, hesitancy or incontinence MS: Denies muscle aches, joint pain or swelling Neuro: Denies headache, neurologic deficits (focal weakness, numbness, tingling), abnormal gait Psych: Denies anxiety, depression, SI/HI/AVH Skin: Denies new rashes or lesions ID: Denies sick contacts, exotic exposures, travel  Examination:  General exam: Appears calm and comfortable, 13 L high flow nasal cannula Respiratory system: Clear to auscultation. Respiratory effort normal. Cardiovascular system: S1 & S2 heard, RRR. No JVD, murmurs, rubs, gallops or clicks. No pedal edema. Gastrointestinal system: Abdomen is nondistended, soft and nontender. No organomegaly or masses felt. Normal bowel sounds heard. Central nervous system: Alert and oriented. No focal neurological deficits. Extremities: Symmetric  5 x 5 power. Skin: No rashes, lesions or ulcers Psychiatry: Judgement and insight appear normal. Mood &  affect appropriate.     Objective: Vitals:   01/09/21 0345 01/09/21 0720 01/09/21 0739 01/09/21 0900  BP:    100/69  Pulse:    63  Resp:    16  Temp: 97.9 F (36.6 C)     TempSrc: Oral     SpO2:  91% 91% 95%  Weight:      Height:        Intake/Output Summary (Last 24 hours) at 01/09/2021 0930 Last data filed at 01/09/2021 0920 Gross per 24 hour  Intake 3 ml  Output 1000 ml  Net -997 ml   Filed Weights   01/04/21 0402 01/05/21 0500 01/06/21 1542  Weight: 74.4 kg 75.1 kg 77.7 kg     Data Reviewed:   CBC: Recent Labs  Lab 01/02/21 1453 01/03/21 0300 01/04/21 0052 01/06/21 0212 01/07/21 0353 01/07/21 1327  WBC 13.2* 8.9 11.8* 9.0 10.5  --   NEUTROABS 10.9*  --   --   --   --   --   HGB 14.7 13.5 12.8 13.6 14.8 15.3*  15.3*  HCT 44.1 40.7 38.0 41.0 44.5 45.0  45.0  MCV 90.9 88.7 90.7 90.7 89.9  --   PLT 180 156 158 195 210  --    Basic Metabolic Panel: Recent Labs  Lab 01/05/21 0158 01/06/21 0212 01/07/21 0353 01/07/21 1327 01/08/21 0359 01/09/21 0431  NA 137 139 136 138  138 138 137  K 3.3* 4.3 4.4 4.1  4.2 3.6 3.3*  CL 98 100 94*  --  95* 95*  CO2 27 31 32  --  33* 31  GLUCOSE 151* 154* 169*  --  114* 103*  BUN 31* 35* 42*  --  49* 51*  CREATININE 1.31* 1.30* 1.54*  --  1.37* 1.31*  CALCIUM 8.8* 9.4 9.3  --  9.2 8.9   GFR: Estimated Creatinine Clearance: 36.9 mL/min (A) (by C-G formula based on SCr of 1.31 mg/dL (H)). Liver Function Tests: Recent Labs  Lab 01/04/21 0052 01/06/21 0212 01/07/21 0353 01/08/21 0359 01/09/21 0431  AST 16 39 45* 33 30  ALT 15 40 59* 47* 43  ALKPHOS 35* 40 42 43 44  BILITOT 0.7 0.6 1.0 1.0 0.9  PROT 6.0* 6.6 6.5 6.7 6.3*  ALBUMIN 3.3* 3.5 3.6 3.5 3.4*   No results for input(s): LIPASE, AMYLASE in the last 168 hours. No results for input(s): AMMONIA in the last 168 hours. Coagulation Profile: No results for input(s): INR, PROTIME in the last 168 hours. Cardiac Enzymes: No results for input(s): CKTOTAL,  CKMB, CKMBINDEX, TROPONINI in the last 168 hours. BNP (last 3 results) No results for input(s): PROBNP in the last 8760 hours. HbA1C: No results for input(s): HGBA1C in the last 72 hours. CBG: No results for input(s): GLUCAP in the last 168 hours. Lipid Profile: No results for input(s): CHOL, HDL, LDLCALC, TRIG, CHOLHDL, LDLDIRECT in the last 72 hours. Thyroid Function Tests: No results for input(s): TSH, T4TOTAL, FREET4, T3FREE, THYROIDAB in the last 72 hours. Anemia Panel: No results for input(s): VITAMINB12, FOLATE, FERRITIN, TIBC, IRON, RETICCTPCT in the last 72 hours. Sepsis Labs: No results for input(s): PROCALCITON, LATICACIDVEN in the last 168 hours.  Recent Results (from the past 240 hour(s))  Resp Panel by RT-PCR (Flu A&B, Covid) Nasopharyngeal Swab     Status: None   Collection Time: 01/02/21  3:55 PM   Specimen:  Nasopharyngeal Swab; Nasopharyngeal(NP) swabs in vial transport medium  Result Value Ref Range Status   SARS Coronavirus 2 by RT PCR NEGATIVE NEGATIVE Final    Comment: (NOTE) SARS-CoV-2 target nucleic acids are NOT DETECTED.  The SARS-CoV-2 RNA is generally detectable in upper respiratory specimens during the acute phase of infection. The lowest concentration of SARS-CoV-2 viral copies this assay can detect is 138 copies/mL. A negative result does not preclude SARS-Cov-2 infection and should not be used as the sole basis for treatment or other patient management decisions. A negative result may occur with  improper specimen collection/handling, submission of specimen other than nasopharyngeal swab, presence of viral mutation(s) within the areas targeted by this assay, and inadequate number of viral copies(<138 copies/mL). A negative result must be combined with clinical observations, patient history, and epidemiological information. The expected result is Negative.  Fact Sheet for Patients:  EntrepreneurPulse.com.au  Fact Sheet for  Healthcare Providers:  IncredibleEmployment.be  This test is no t yet approved or cleared by the [place] FDA and  has been authorized for detection and/or diagnosis of SARS-CoV-2 by FDA under an Emergency Use Authorization (EUA). This EUA will remain  in effect (meaning this test can be used) for the duration of the COVID-19 declaration under Section 564(b)(1) of the Act, 21 U.S.C.section 360bbb-3(b)(1), unless the authorization is terminated  or revoked sooner.       Influenza A by PCR NEGATIVE NEGATIVE Final   Influenza B by PCR NEGATIVE NEGATIVE Final    Comment: (NOTE) The Xpert Xpress SARS-CoV-2/FLU/RSV plus assay is intended as an aid in the diagnosis of influenza from Nasopharyngeal swab specimens and should not be used as a sole basis for treatment. Nasal washings and aspirates are unacceptable for Xpert Xpress SARS-CoV-2/FLU/RSV testing.  Fact Sheet for Patients: EntrepreneurPulse.com.au  Fact Sheet for Healthcare Providers: IncredibleEmployment.be  This test is not yet approved or cleared by the [place] FDA and has been authorized for detection and/or diagnosis of SARS-CoV-2 by FDA under an Emergency Use Authorization (EUA). This EUA will remain in effect (meaning this test can be used) for the duration of the COVID-19 declaration under Section 564(b)(1) of the Act, 21 U.S.C. section 360bbb-3(b)(1), unless the authorization is terminated or revoked.  Performed at [facility], [place] [address], [place], [place] [address]          Radiology Studies: CARDIAC CATHETERIZATION  Result Date: 01/07/2021 1. Moderate pulmonary arterial hypertension, PVR 4.7 WU. 2. Preserved cardiac output. 3. Normal/low filling pressures. Consider increasing back up on selexipag.        Scheduled Meds: . aspirin EC  81 mg Oral QHS  . atenolol  25 mg Oral QHS  . atenolol  50 mg Oral Daily  . enoxaparin  (LOVENOX) injection  40 mg Subcutaneous Daily  . fluticasone furoate-vilanterol  1 puff Inhalation Daily   And  . umeclidinium bromide  1 puff Inhalation Daily  . furosemide  40 mg Intravenous Q12H  . gabapentin  300 mg Oral QHS  . levothyroxine  112 mcg Oral QAC breakfast  . omega-3 acid ethyl esters  1 g Oral Daily  . pantoprazole  40 mg Oral Daily  . potassium chloride  10 mEq Oral Daily  . predniSONE  10 mg Oral Q breakfast  . rosuvastatin  20 mg Oral Daily  . Selexipag  1,400 mcg Oral BID  . sodium chloride flush  3 mL Intravenous Q12H  . tadalafil (PAH)  40 mg Oral Daily  Continuous Infusions:   LOS: 7 days   Time spent= 35 mins    Taha Dimond Arsenio Loader, MD [facility]  If 7PM-7AM, please contact night-coverage  01/09/2021, 9:30 AM

## 2021-01-09 NOTE — Telephone Encounter (Addendum)
-----  Message from Lanier Clam, MD sent at 01/09/2021 10:12 AM EDT ----- Regarding: Standley Dakins - This lady is on tadalfil and Uptravi. I am taking over her outpatient care. Needs to increase Uptravi - currently on 1400 mcg BID. Increase to 1600 mcg BID as soon as possible with subsequent uptitration in coming weeks - was up to 2400 mcg BID in past. Can you assist with this? Happy to send scripts, sign forms, etc. Just let me know what I need to do.

## 2021-01-09 NOTE — Telephone Encounter (Addendum)
Uptravi:  Patient is approved to receive Uptravi at no cost through [facility] until 08/03/21.   Shipment went out today for Uptravi 1600 mcg twice daily to be delivered by tomorrow.  This rx that Dr. Erin Fulling called in to [facility] was for 30 day supply and had no refills.  Future prescriptions can be sent to [facility] in [place], [place].  Pharmacist at [facility] has noted that Dr. Silas Flood should be contacted for future Uptravi refills.    Tadalafil:  Patient fills locally through [facility] and can continue to do so

## 2021-01-10 LAB — BASIC METABOLIC PANEL
Anion gap: 11 (ref 5–15)
BUN: 48 mg/dL — ABNORMAL HIGH (ref 8–23)
CO2: 29 mmol/L (ref 22–32)
Calcium: 9.1 mg/dL (ref 8.9–10.3)
Chloride: 95 mmol/L — ABNORMAL LOW (ref 98–111)
Creatinine, Ser: 1.29 mg/dL — ABNORMAL HIGH (ref 0.44–1.00)
GFR, Estimated: 43 mL/min — ABNORMAL LOW (ref >60)
Glucose, Bld: 218 mg/dL — ABNORMAL HIGH (ref 70–99)
Potassium: 3.6 mmol/L (ref 3.5–5.1)
Sodium: 135 mmol/L (ref 135–145)

## 2021-01-10 LAB — MAGNESIUM: Magnesium: 2.2 mg/dL (ref 1.7–2.4)

## 2021-01-10 MED ORDER — POTASSIUM CHLORIDE 20 MEQ PO PACK: 40.0000 meq | PACK | Freq: Once | ORAL | Status: AC

## 2021-01-10 MED ORDER — CITALOPRAM HYDROBROMIDE 20 MG PO TABS
10.0000 mg | ORAL_TABLET | Freq: Every day | ORAL | Status: DC
  Administered 2021-01-10 – 2021-01-13 (×4): 10 mg via ORAL
  Filled 2021-01-10 (×4): qty 1

## 2021-01-10 MED ORDER — POTASSIUM CHLORIDE 20 MEQ PO PACK
40.0000 meq | PACK | Freq: Once | ORAL | Status: AC
  Administered 2021-01-10: 40 meq via ORAL
  Filled 2021-01-10: qty 2

## 2021-01-10 NOTE — Progress Notes (Signed)
PROGRESS NOTE    KEYMANI GLYNN  TNB:[id number] DOB: [birth year] DOA: 01/02/2021 PCP: Mosie Lukes, MD   Brief Narrative:  75 y.o. female with medical history significant for COPD, sarcoidosis, HTN, HLD, HFpEF, cardiomegaly, chronic respiratory failure with hypoxia on oxygen at 7-8 L/min by [place] at home who presents for evaluation of worsening SOB. She presented by EMS on NRB and was started on Bipap in the ER.  Symptoms are combination of worsening pulmonary hypertension and COPD exacerbation.  Pulmonary team is following.   Assessment & Plan:   Principal Problem:   Acute on chronic respiratory failure with hypoxia (HCC) Active Problems:   Sarcoidosis stage I   Hyperlipidemia   Essential hypertension   Hypothyroidism   Cardiomegaly   Pulmonary HTN (HCC)   Chronic heart failure with preserved ejection fraction (HFpEF) (HCC)   COPD with acute exacerbation (HCC)   Prolonged QT interval   Acute on chronic respiratory failure with hypoxia, 12 L high flow -Symptoms are combination of worsening pulmonary hypertension, COPD exacerbation and underlying fibrosis.  Supplemental oxygen.  Baseline oxygen 8 L nasal cannula - Pulmonary team following - Continue bronchodilators, I-S/flutter, out of bed to chair - Uptarvi approved with help of PCCM - Tadalafil - Prednisone 10 mg daily for 1 week followed by 5 mg for 1 week then off - Follow-up outpatient pulmonary.    COPD, chronic -Continue bronchodilators and prednisone as prescribed     Essential hypertension -Atenolol      Pulmonary HTN  -Status post RHC.  Continue tadalafil and Uptarvi    Chronic heart failure with preserved ejection fraction (HFpEF)  -No signs of fluid overload at this time.   -Patient does have cardiomegaly on chest x-ray. -2d echo with preserved EF noted, Pt is s/p RHC 6/6     Sarcoidosis stage I -On chronic prednisone at home.  Following PCCM.     Hyperlipidemia -Continue Crestor      Hypothyroidism -Daily Synthroid    T12 Compression fracture -Pain controlled.  PT recommended SNF, patient declined.  Will arrange for home health  DVT prophylaxis: Lovenox Code Status: DNR Family Communication:    Status is: Inpatient  Remains inpatient appropriate because:Inpatient level of care appropriate due to severity of illness   Dispo: The patient is from: Home              Anticipated d/c is to: Home              Patient currently is not medically stable to d/c.  Patient is still on 12L high flow nasal cannula, unsafe for discharge.  Will discharge once cleared by PCCM   Difficult to place patient No    Subjective: Still having some exertional dyspnea but really wants to go home.  I have explained her that currently she is not stable to go home and she understands.   Review of Systems Otherwise negative except as per HPI, including: General: Denies fever, chills, night sweats or unintended weight loss. Resp: Denies cough, wheezing, shortness of breath. Cardiac: Denies chest pain, palpitations, orthopnea, paroxysmal nocturnal dyspnea. GI: Denies abdominal pain, nausea, vomiting, diarrhea or constipation GU: Denies dysuria, frequency, hesitancy or incontinence MS: Denies muscle aches, joint pain or swelling Neuro: Denies headache, neurologic deficits (focal weakness, numbness, tingling), abnormal gait Psych: Denies anxiety, depression, SI/HI/AVH Skin: Denies new rashes or lesions ID: Denies sick contacts, exotic exposures, travel  Examination: Constitutional: Not in acute distress, 12 L nasal cannula Respiratory: Clear to auscultation bilaterally  Cardiovascular: Normal sinus rhythm, no rubs Abdomen: Nontender nondistended good bowel sounds Musculoskeletal: No edema noted Skin: No rashes seen Neurologic: CN 2-12 grossly intact.  And nonfocal Psychiatric: Normal judgment and insight. Alert and oriented x 3. Normal mood.   Objective: Vitals:   01/10/21 0500  01/10/21 0721 01/10/21 0823 01/10/21 0832  BP: 100/78   113/66  Pulse: 60   62  Resp: 15     Temp: 97.6 F (36.4 C)     TempSrc: Oral     SpO2: 93% 93% 93%   Weight:      Height:        Intake/Output Summary (Last 24 hours) at 01/10/2021 0845 Last data filed at 01/09/2021 2116 Gross per 24 hour  Intake 121 ml  Output 650 ml  Net -529 ml   Filed Weights   01/05/21 0500 01/06/21 1542 01/10/21 0245  Weight: 75.1 kg 77.7 kg 77.7 kg     Data Reviewed:   CBC: Recent Labs  Lab 01/04/21 0052 01/06/21 0212 01/07/21 0353 01/07/21 1327  WBC 11.8* 9.0 10.5  --   HGB 12.8 13.6 14.8 15.3*  15.3*  HCT 38.0 41.0 44.5 45.0  45.0  MCV 90.7 90.7 89.9  --   PLT 158 195 210  --    Basic Metabolic Panel: Recent Labs  Lab 01/05/21 0158 01/06/21 0212 01/07/21 0353 01/07/21 1327 01/08/21 0359 01/09/21 0431  NA 137 139 136 138  138 138 137  K 3.3* 4.3 4.4 4.1  4.2 3.6 3.3*  CL 98 100 94*  --  95* 95*  CO2 27 31 32  --  33* 31  GLUCOSE 151* 154* 169*  --  114* 103*  BUN 31* 35* 42*  --  49* 51*  CREATININE 1.31* 1.30* 1.54*  --  1.37* 1.31*  CALCIUM 8.8* 9.4 9.3  --  9.2 8.9   GFR: Estimated Creatinine Clearance: 36.9 mL/min (A) (by C-G formula based on SCr of 1.31 mg/dL (H)). Liver Function Tests: Recent Labs  Lab 01/04/21 0052 01/06/21 0212 01/07/21 0353 01/08/21 0359 01/09/21 0431  AST 16 39 45* 33 30  ALT 15 40 59* 47* 43  ALKPHOS 35* 40 42 43 44  BILITOT 0.7 0.6 1.0 1.0 0.9  PROT 6.0* 6.6 6.5 6.7 6.3*  ALBUMIN 3.3* 3.5 3.6 3.5 3.4*   No results for input(s): LIPASE, AMYLASE in the last 168 hours. No results for input(s): AMMONIA in the last 168 hours. Coagulation Profile: No results for input(s): INR, PROTIME in the last 168 hours. Cardiac Enzymes: No results for input(s): CKTOTAL, CKMB, CKMBINDEX, TROPONINI in the last 168 hours. BNP (last 3 results) No results for input(s): PROBNP in the last 8760 hours. HbA1C: No results for input(s): HGBA1C in the  last 72 hours. CBG: No results for input(s): GLUCAP in the last 168 hours. Lipid Profile: No results for input(s): CHOL, HDL, LDLCALC, TRIG, CHOLHDL, LDLDIRECT in the last 72 hours. Thyroid Function Tests: No results for input(s): TSH, T4TOTAL, FREET4, T3FREE, THYROIDAB in the last 72 hours. Anemia Panel: No results for input(s): VITAMINB12, FOLATE, FERRITIN, TIBC, IRON, RETICCTPCT in the last 72 hours. Sepsis Labs: No results for input(s): PROCALCITON, LATICACIDVEN in the last 168 hours.  Recent Results (from the past 240 hour(s))  Resp Panel by RT-PCR (Flu A&B, Covid) Nasopharyngeal Swab     Status: None   Collection Time: 01/02/21  3:55 PM   Specimen: Nasopharyngeal Swab; Nasopharyngeal(NP) swabs in vial transport medium  Result Value Ref Range Status  SARS Coronavirus 2 by RT PCR NEGATIVE NEGATIVE Final    Comment: (NOTE) SARS-CoV-2 target nucleic acids are NOT DETECTED.  The SARS-CoV-2 RNA is generally detectable in upper respiratory specimens during the acute phase of infection. The lowest concentration of SARS-CoV-2 viral copies this assay can detect is 138 copies/mL. A negative result does not preclude SARS-Cov-2 infection and should not be used as the sole basis for treatment or other patient management decisions. A negative result may occur with  improper specimen collection/handling, submission of specimen other than nasopharyngeal swab, presence of viral mutation(s) within the areas targeted by this assay, and inadequate number of viral copies(<138 copies/mL). A negative result must be combined with clinical observations, patient history, and epidemiological information. The expected result is Negative.  Fact Sheet for Patients:  EntrepreneurPulse.com.au  Fact Sheet for Healthcare Providers:  IncredibleEmployment.be  This test is no t yet approved or cleared by the [place] FDA and  has been authorized for detection and/or  diagnosis of SARS-CoV-2 by FDA under an Emergency Use Authorization (EUA). This EUA will remain  in effect (meaning this test can be used) for the duration of the COVID-19 declaration under Section 564(b)(1) of the Act, 21 U.S.C.section 360bbb-3(b)(1), unless the authorization is terminated  or revoked sooner.       Influenza A by PCR NEGATIVE NEGATIVE Final   Influenza B by PCR NEGATIVE NEGATIVE Final    Comment: (NOTE) The Xpert Xpress SARS-CoV-2/FLU/RSV plus assay is intended as an aid in the diagnosis of influenza from Nasopharyngeal swab specimens and should not be used as a sole basis for treatment. Nasal washings and aspirates are unacceptable for Xpert Xpress SARS-CoV-2/FLU/RSV testing.  Fact Sheet for Patients: EntrepreneurPulse.com.au  Fact Sheet for Healthcare Providers: IncredibleEmployment.be  This test is not yet approved or cleared by the [place] FDA and has been authorized for detection and/or diagnosis of SARS-CoV-2 by FDA under an Emergency Use Authorization (EUA). This EUA will remain in effect (meaning this test can be used) for the duration of the COVID-19 declaration under Section 564(b)(1) of the Act, 21 U.S.C. section 360bbb-3(b)(1), unless the authorization is terminated or revoked.  Performed at [facility], [place] [address] [place], [place] [address]          Radiology Studies: No results found.       Scheduled Meds:  aspirin EC  81 mg Oral QHS   atenolol  25 mg Oral QHS   atenolol  50 mg Oral Daily   enoxaparin (LOVENOX) injection  40 mg Subcutaneous Daily   fluticasone furoate-vilanterol  1 puff Inhalation Daily   And   umeclidinium bromide  1 puff Inhalation Daily   furosemide  40 mg Intravenous Q12H   gabapentin  300 mg Oral QHS   levothyroxine  112 mcg Oral QAC breakfast   omega-3 acid ethyl esters  1 g Oral Daily   pantoprazole  40 mg Oral Daily   potassium chloride  10 mEq  Oral Daily   potassium chloride  40 mEq Oral Once   predniSONE  10 mg Oral Q breakfast   rosuvastatin  20 mg Oral Daily   Selexipag  1,400 mcg Oral BID   sodium chloride flush  3 mL Intravenous Q12H   tadalafil (PAH)  40 mg Oral Daily   Continuous Infusions:   LOS: 8 days   Time spent= 35 mins    Izaha Shughart Arsenio Loader, MD [facility]  If 7PM-7AM, please contact night-coverage  01/10/2021, 8:45 AM

## 2021-01-10 NOTE — Progress Notes (Signed)
NAME:  Kimberly Irwin, MRN:  [id number], DOB:  02-Oct-1945, LOS: 8 ADMISSION DATE:  01/02/2021, CONSULTATION DATE:  01/04/2021 REFERRING MD:  Maylene Roes - TRH , CHIEF COMPLAINT:  SOB, hypoxia   History of Present Illness:  75 yo F PMH sarcoidosis (no active disease but remains on steroids for SOB), Emphysema, HFpEF , pulmonary HTN, Chronic hypoxic respiratory failure on 8LNC admitted to [facility] 6/1 with hypoxia after a fall from chair to knees, after which EMS was dispatched and brought pt to hospital due to hypoxia. Pt does endorse increasing SOB in the evening for about a week ever since her last Uptravi decrease. Also endorses an intermittent cough associated  with white mucus. Denied fever, chest pain, palpitations, sick contacts, new environmental exposures/allergens. CTA chest without evidence of PE. Exam findings most consistent with AECOPD, was started on solumedrol 6/2 and continued on BDs. COVID is negative, no RVP sent, not on abx. Since admission 6/1, O2 requirements have de-escalated from NRB , BiPAP to HFNC but have remained significantly higher than baseline. Currently on HFNC 80% FiO2 20 L/min PCCM consulted 6/3 for persistent hypoxia, inability to wean O2.     Of note, recent outpatient HRCT 10/08/20 with some fibrotic changes, paraseptal and centrilobular emphysema, diffuse bronchial wall thickening  In review of documentation from [facility] (where pt follows for pulmonary HTN) it looks like Uptravi was recently decreased from 2400 to 1700 to 1400 (last decrease approx 1week PTA)    Pertinent  Medical History  Sarcodisis  Pulmonary HTN HFpEF Emphysema  Chronic hypoxic respiratory failure  HTN HLD DDD S/p L3-4, 4-5posterior laminectomy, fusion, fixation GERD Hiatal hernia Chronic tremor  Hypothyroidism  Significant Hospital Events: Including procedures, antibiotic start and stop dates in addition to other pertinent events   6/1 admitted to [facility] for acute on chronic hypoxia  6/3 still on  HFNC, PCCM consulted for inability to wean O2  6/4 25L, 70% FiO2 6/6 weaned to 10L [place], did ok low 90s on 8L. RHC showed PVR 4.7 WU, CO and CI preserved, normal filling pressures. 6/8 Continues to require 10 - 12L of O2  Interim History / Subjective:   No acute issues overnight. Patient is wanting to go home.   Objective   Blood pressure 113/66, pulse 62, temperature 97.6 F (36.4 C), temperature source Oral, resp. rate 15, height _0  (1.626 m), weight 77.7 kg, SpO2 93 %.        Intake/Output Summary (Last 24 hours) at 01/10/2021 1012 Last data filed at 01/09/2021 2116 Gross per 24 hour  Intake 118 ml  Output 650 ml  Net -532 ml   Filed Weights   01/05/21 0500 01/06/21 1542 01/10/21 0245  Weight: 75.1 kg 77.7 kg 77.7 kg    Examination: General: frail, chronically ill appearing woman lying in bed HENT: [place]/AT, eyes anicteric, moist mucous membranes Lungs: clear to auscultation bilaterally, no wheezing or rales. Cardiovascular: S1S2, RRR Abdomen: soft, non-tender, non-distended  Extremities:  No edema, warm Neuro: head bobbing/oral tremor, answering questions appropriately, globally weak Derm: pallor, no rashes  Labs/imaging that I have personally reviewed  (right click and "Reselect all SmartList Selections" daily)  HRCT 10/08/20 with some fibrotic changes, paraseptal and centrilobular emphysema, diffuse bronchial wall thickening   Out pt autoimmune/rheum labs -- unremarkable except ANA positive, speckled 1:40  CTA chest 6/2> no PE. Tortuous vessels. Patent major airways, low lung volumes. Centrolobular emphysema. Stable nodules.   6/1 BNP 450 >> 513 on 6/4  6/9 BUN 48  Cr 1.29  Per [facility] records, prev PH workup: RHC:  11/2017: RA 2, RV 66/5, PA 66/16, mPAP 34, PAWP 3, PA saturation 60% AO 92% Cardiac Output (Fick) 3.73, Cardiac Index (Fick) 2.04, PVR 8.3 WU   05/2019: (mPAP 28 mmHg and PVR 2.75 WU)  V/Q scan: 12/2017: Normal perfusion lung scan. Minimally diminished  ventilation to the RIGHT apex.  6MWT 05/17/19: ambulated 122 meters, required 6 L of supplemental O2  Resolved Hospital Problem list     Assessment & Plan:   Acute on chronic hypoxic respiratory failure; suspect this is due to worsened PH as her pulm vasodilators have been recently decreased due to concern that their up-titration in the fall of 2020 made her worse.Leading to worsened RV pressure/volume overload and VQ mismatch. Suspect drops low at home leading to hyopoxemic pulmonary vasoconstriction further exacerbating things. Hypoxemia improved with diuresis. Emphysema Sarcoidosis  -autoimmune w/u unrevealing -ANA weakly positive, speckled -Esbreit not yet started, just got approved. 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 Our pharmacy does not stock this and it can only be obtained from specialty outpatient pharmacies.  -She was recently given prednisone taper- 64m x 2 weeks, then 168mx 1 week, 1027maily -prev intolerant to macitentan -on tadalafil 71m41mily -BNP on admission 500   Plan: - Con't triple inhaled therapy - prednisone 10 mg daily x 1 week then 5 mg x 1 week then off - con't tadalafil at PTA dose - Continue lasix 71mg66mBID - RHC 6/6 PVR 4.7 WU, CO and CI preserved, normal filling pressures. - Continue Uptravi 1400 mcg BID. I have placed order at [id number]. Patient's daughter will bring the medication to the hospital tomorrow. -Continue PT and incentive spirometry -Wean O2 as able  We will continue to follow.   JonathFreda JacksoneBaueHealy [facility] Office: [phone] Amion for personal pager PCCM on call pager [phone] 7pm. Please call Elink 7p-7a. [phone]

## 2021-01-10 NOTE — Plan of Care (Signed)
Patient remains on O2. Denies SOB or discomfort. Safety precautions maintained.

## 2021-01-10 NOTE — Progress Notes (Signed)
Physical Therapy Treatment Patient Details Name: Kimberly Irwin MRN: [id number] DOB: 07/15/1946 Today's Date: 01/10/2021    History of Present Illness 75 y.o. female admitted 6/1 who presents for evaluation of worsening SOB. She presented by EMS on NRB and was started on Bipap in the ER. She was weaned to heated HFNC and then to HFNC by 6/6.  PMH:  COPD, sarcoidosis, HTN, HLD, HFpEF, cardiomegaly, chronic respiratory failure with hypoxia on oxygen 7-8 L/minNC at home    PT Comments    Patient awake and alert on arrival, however slow to respond to questions or commands. States she has not been OOB since last PT session (?accuracy) when asked why she thought she was having difficulty moving today. Repeated transfers for strengthening and due to patient not strong enough to walk farther distance.    Follow Up Recommendations  SNF     Equipment Recommendations  None recommended by PT    Recommendations for Other Services       Precautions / Restrictions Precautions Precautions: Fall    Mobility  Bed Mobility Overal bed mobility: Needs Assistance Bed Mobility: Supine to Sit     Supine to sit: Min assist;HOB elevated     General bed mobility comments: incr time and assist to scoot left pelvis around so LLE reached floor    Transfers Overall transfer level: Needs assistance Equipment used: Rolling walker (2 wheeled) Transfers: Sit to/from Stand Sit to Stand: Min assist;Mod assist         General transfer comment: initial sit to stand with mod assist with pt having difficulty moving rt hand from pushing off bed to handle of rw; on second and third transfers allowed pt to push down on bil sides of rw (which she prefers) and only required min assist  Ambulation/Gait Ambulation/Gait assistance: Min guard Gait Distance (Feet): 3 Feet Assistive device: Rolling walker (2 wheeled) Gait Pattern/deviations: Decreased stride length;Step-to pattern;Shuffle Gait velocity: decr    General Gait Details: ambulated on 8L with lowest sats 90 percent; could not walk far due to fatigue and pt reporting "I'm just out of it today"   Stairs             Wheelchair Mobility    Modified Rankin (Stroke Patients Only)       Balance Overall balance assessment: Needs assistance Sitting-balance support: Feet supported;Bilateral upper extremity supported Sitting balance-Leahy Scale: Poor Sitting balance - Comments: leaning posterior and to right initially and intermittently   Standing balance support: Bilateral upper extremity supported;During functional activity Standing balance-Leahy Scale: Poor Standing balance comment: relies on UE support, requiried assist for initial standing balance.                            Cognition Arousal/Alertness: Awake/alert Behavior During Therapy: WFL for tasks assessed/performed                                   General Comments: slow processing      Exercises      General Comments        Pertinent Vitals/Pain Pain Assessment: Faces Faces Pain Scale: Hurts a little bit Pain Location: back Pain Descriptors / Indicators: Discomfort Pain Intervention(s): Limited activity within patient's tolerance;Monitored during session    Home Living  Prior Function            PT Goals (current goals can now be found in the care plan section) Acute Rehab PT Goals Patient Stated Goal: to go home Time For Goal Achievement: 01/18/21 Potential to Achieve Goals: Good Progress towards PT goals: Not progressing toward goals - comment (everything required more effort and time)    Frequency    Min 3X/week      PT Plan Current plan remains appropriate    Co-evaluation              AM-PAC PT "6 Clicks" Mobility   Outcome Measure  Help needed turning from your back to your side while in a flat bed without using bedrails?: A Little Help needed moving from lying on  your back to sitting on the side of a flat bed without using bedrails?: A Little Help needed moving to and from a bed to a chair (including a wheelchair)?: A Little Help needed standing up from a chair using your arms (e.g., wheelchair or bedside chair)?: A Little Help needed to walk in hospital room?: A Little Help needed climbing 3-5 steps with a railing? : A Lot 6 Click Score: 17    End of Session Equipment Utilized During Treatment: Gait belt;Oxygen Activity Tolerance: Patient limited by fatigue Patient left: in chair;with call bell/phone within reach (alarm pad was not in chair any longer; pt able to verbalize not to get up alone and rn ok without alarm) Nurse Communication: Mobility status PT Visit Diagnosis: Unsteadiness on feet (R26.81);Muscle weakness (generalized) (M62.81)     Time: 7014-1030 PT Time Calculation (min) (ACUTE ONLY): 27 min  Charges:  $Gait Training: 23-37 mins                      Arby Barrette, PT Pager [phone]    Rexanne Mano 01/10/2021, 3:57 PM

## 2021-01-11 LAB — BASIC METABOLIC PANEL
Anion gap: 11 (ref 5–15)
BUN: 50 mg/dL — ABNORMAL HIGH (ref 8–23)
CO2: 27 mmol/L (ref 22–32)
Calcium: 9 mg/dL (ref 8.9–10.3)
Chloride: 96 mmol/L — ABNORMAL LOW (ref 98–111)
Creatinine, Ser: 1.4 mg/dL — ABNORMAL HIGH (ref 0.44–1.00)
GFR, Estimated: 39 mL/min — ABNORMAL LOW (ref >60)
Glucose, Bld: 161 mg/dL — ABNORMAL HIGH (ref 70–99)
Potassium: 3.8 mmol/L (ref 3.5–5.1)
Sodium: 134 mmol/L — ABNORMAL LOW (ref 135–145)

## 2021-01-11 LAB — MAGNESIUM: Magnesium: 2.2 mg/dL (ref 1.7–2.4)

## 2021-01-11 MED ORDER — POTASSIUM CHLORIDE CRYS ER 20 MEQ PO TBCR
20.0000 meq | EXTENDED_RELEASE_TABLET | Freq: Once | ORAL | Status: AC
  Administered 2021-01-11: 20 meq via ORAL
  Filled 2021-01-11: qty 1

## 2021-01-11 MED ORDER — FUROSEMIDE 40 MG PO TABS
40.0000 mg | ORAL_TABLET | Freq: Every day | ORAL | Status: DC
  Administered 2021-01-12 – 2021-01-13 (×2): 40 mg via ORAL
  Filled 2021-01-11 (×3): qty 1

## 2021-01-11 MED ORDER — POTASSIUM CHLORIDE 20 MEQ PO PACK: 40.0000 meq | PACK | Freq: Once | ORAL | Status: DC

## 2021-01-11 MED ORDER — FUROSEMIDE 40 MG PO TABS
40.0000 mg | ORAL_TABLET | Freq: Once | ORAL | Status: AC
  Administered 2021-01-11: 40 mg via ORAL
  Filled 2021-01-11: qty 1

## 2021-01-11 MED ORDER — SELEXIPAG 1600 MCG PO TABS
1600.0000 ug | ORAL_TABLET | Freq: Two times a day (BID) | ORAL | Status: DC
  Administered 2021-01-11 – 2021-01-14 (×6): 1600 ug via ORAL
  Filled 2021-01-11 (×6): qty 1

## 2021-01-11 NOTE — Plan of Care (Signed)

## 2021-01-11 NOTE — Progress Notes (Signed)
PROGRESS NOTE    Kimberly Irwin  KDT:[id number] DOB: July 21, 1946 DOA: 01/02/2021 PCP: Mosie Lukes, MD   Brief Narrative:  75 y.o. female with medical history significant for COPD, sarcoidosis, HTN, HLD, HFpEF, cardiomegaly, chronic respiratory failure with hypoxia on oxygen at 7-8 L/min by [place] at home who presents for evaluation of worsening SOB. She presented by EMS on NRB and was started on Bipap in the ER.  Symptoms are combination of worsening pulmonary hypertension and COPD exacerbation.  Pulmonary team is following.  [name] was increased by pulmonary to 1600 mcg twice daily.   Assessment & Plan:   Principal Problem:   Acute on chronic respiratory failure with hypoxia (HCC) Active Problems:   Sarcoidosis stage I   Hyperlipidemia   Essential hypertension   Hypothyroidism   Cardiomegaly   Pulmonary HTN (HCC)   Chronic heart failure with preserved ejection fraction (HFpEF) (HCC)   COPD with acute exacerbation (HCC)   Prolonged QT interval   Acute on chronic respiratory failure with hypoxia, 10 L high flow -Symptoms are combination of worsening pulmonary hypertension, COPD exacerbation and underlying fibrosis.  Supplemental oxygen.  Baseline oxygen 8 L nasal cannula - Pulmonary team following - Continue bronchodilators, I-S/flutter, out of bed to chair - Uptravi increased to 1600 mg twice daily. - Tadalafil - Prednisone 10 mg daily for 1 week followed by 5 mg for 1 week then off - Follow-up outpatient pulmonary.    COPD, chronic -Continue bronchodilators and prednisone as prescribed     Essential hypertension -Atenolol      Pulmonary HTN  -Status post RHC.  Continue tadalafil and Uptarvi    Chronic heart failure with preserved ejection fraction (HFpEF)  -No signs of fluid overload at this time.   -Patient does have cardiomegaly on chest x-ray. -2d echo with preserved EF noted, Pt is s/p RHC 6/6     Sarcoidosis stage I -On chronic prednisone at home.  Following  PCCM.     Hyperlipidemia -Continue Crestor     Hypothyroidism -Daily Synthroid    T12 Compression fracture -Pain controlled.  PT recommended SNF, patient declined.  Will arrange for home health  DVT prophylaxis: Lovenox Code Status: DNR Family Communication:    Status is: Inpatient  Remains inpatient appropriate because:Inpatient level of care appropriate due to severity of illness   Dispo: The patient is from: Home              Anticipated d/c is to: Home              Patient currently is not medically stable to d/c.  Patient is still on 10L high flow nasal cannula, unsafe for discharge.  Baseline 8 L nasal cannula.  Will discharge once cleared by PCCM   Difficult to place patient No    Subjective: No new complaints, still has exertional shortness of breath.  Review of Systems Otherwise negative except as per HPI, including: General: Denies fever, chills, night sweats or unintended weight loss. Resp: Denies hemoptysis Cardiac: Denies chest pain, palpitations, orthopnea, paroxysmal nocturnal dyspnea. GI: Denies abdominal pain, nausea, vomiting, diarrhea or constipation GU: Denies dysuria, frequency, hesitancy or incontinence MS: Denies muscle aches, joint pain or swelling Neuro: Denies headache, neurologic deficits (focal weakness, numbness, tingling), abnormal gait Psych: Denies anxiety, depression, SI/HI/AVH Skin: Denies new rashes or lesions ID: Denies sick contacts, exotic exposures, travel  Examination: Constitutional: Not in acute distress, 10 L nasal cannula Respiratory: Clear to auscultation bilaterally Cardiovascular: Normal sinus rhythm, no rubs  Abdomen: Nontender nondistended good bowel sounds Musculoskeletal: No edema noted Skin: No rashes seen Neurologic: CN 2-12 grossly intact.  And nonfocal Psychiatric: Normal judgment and insight. Alert and oriented x 3. Normal mood.  Objective: Vitals:   01/10/21 2022 01/11/21 0500 01/11/21 0600 01/11/21 0727   BP: (!) 100/56 (!) 99/56 90/71 100/65  Pulse: 62 62 (!) 51 (!) 56  Resp: _0 Temp: 98 F (36.7 C) 98 F (36.7 C)    TempSrc: Oral Axillary    SpO2: 91% 91% 93% 93%  Weight:  75.2 kg    Height:        Intake/Output Summary (Last 24 hours) at 01/11/2021 1148 Last data filed at 01/11/2021 0948 Gross per 24 hour  Intake 3 ml  Output 800 ml  Net -797 ml   Filed Weights   01/06/21 1542 01/10/21 0245 01/11/21 0500  Weight: 77.7 kg 77.7 kg 75.2 kg     Data Reviewed:   CBC: Recent Labs  Lab 01/06/21 0212 01/07/21 0353 01/07/21 1327  WBC 9.0 10.5  --   HGB 13.6 14.8 15.3*  15.3*  HCT 41.0 44.5 45.0  45.0  MCV 90.7 89.9  --   PLT 195 210  --    Basic Metabolic Panel: Recent Labs  Lab 01/07/21 0353 01/07/21 1327 01/08/21 0359 01/09/21 0431 01/10/21 1020 01/11/21 0926  NA 136 138  138 138 137 135 134*  K 4.4 4.1  4.2 3.6 3.3* 3.6 3.8  CL 94*  --  95* 95* 95* 96*  CO2 32  --  33* _1 GLUCOSE 169*  --  114* 103* 218* 161*  BUN 42*  --  49* 51* 48* 50*  CREATININE 1.54*  --  1.37* 1.31* 1.29* 1.40*  CALCIUM 9.3  --  9.2 8.9 9.1 9.0  MG  --   --   --   --  2.2 2.2   GFR: Estimated Creatinine Clearance: 33.9 mL/min (A) (by C-G formula based on SCr of 1.4 mg/dL (H)). Liver Function Tests: Recent Labs  Lab 01/06/21 0212 01/07/21 0353 01/08/21 0359 01/09/21 0431  AST 39 45* 33 30  ALT 40 59* 47* 43  ALKPHOS 40 42 43 44  BILITOT 0.6 1.0 1.0 0.9  PROT 6.6 6.5 6.7 6.3*  ALBUMIN 3.5 3.6 3.5 3.4*   No results for input(s): LIPASE, AMYLASE in the last 168 hours. No results for input(s): AMMONIA in the last 168 hours. Coagulation Profile: No results for input(s): INR, PROTIME in the last 168 hours. Cardiac Enzymes: No results for input(s): CKTOTAL, CKMB, CKMBINDEX, TROPONINI in the last 168 hours. BNP (last 3 results) No results for input(s): PROBNP in the last 8760 hours. HbA1C: No results for input(s): HGBA1C in the last 72 hours. CBG: No  results for input(s): GLUCAP in the last 168 hours. Lipid Profile: No results for input(s): CHOL, HDL, LDLCALC, TRIG, CHOLHDL, LDLDIRECT in the last 72 hours. Thyroid Function Tests: No results for input(s): TSH, T4TOTAL, FREET4, T3FREE, THYROIDAB in the last 72 hours. Anemia Panel: No results for input(s): VITAMINB12, FOLATE, FERRITIN, TIBC, IRON, RETICCTPCT in the last 72 hours. Sepsis Labs: No results for input(s): PROCALCITON, LATICACIDVEN in the last 168 hours.  Recent Results (from the past 240 hour(s))  Resp Panel by RT-PCR (Flu A&B, Covid) Nasopharyngeal Swab     Status: None   Collection Time: 01/02/21  3:55 PM   Specimen: Nasopharyngeal Swab; Nasopharyngeal(NP) swabs in vial transport medium  Result Value Ref  Range Status   SARS Coronavirus 2 by RT PCR NEGATIVE NEGATIVE Final    Comment: (NOTE) SARS-CoV-2 target nucleic acids are NOT DETECTED.  The SARS-CoV-2 RNA is generally detectable in upper respiratory specimens during the acute phase of infection. The lowest concentration of SARS-CoV-2 viral copies this assay can detect is 138 copies/mL. A negative result does not preclude SARS-Cov-2 infection and should not be used as the sole basis for treatment or other patient management decisions. A negative result may occur with  improper specimen collection/handling, submission of specimen other than nasopharyngeal swab, presence of viral mutation(s) within the areas targeted by this assay, and inadequate number of viral copies(<138 copies/mL). A negative result must be combined with clinical observations, patient history, and epidemiological information. The expected result is Negative.  Fact Sheet for Patients:  EntrepreneurPulse.com.au  Fact Sheet for Healthcare Providers:  IncredibleEmployment.be  This test is no t yet approved or cleared by the [place] FDA and  has been authorized for detection and/or diagnosis of SARS-CoV-2  by FDA under an Emergency Use Authorization (EUA). This EUA will remain  in effect (meaning this test can be used) for the duration of the COVID-19 declaration under Section 564(b)(1) of the Act, 21 U.S.C.section 360bbb-3(b)(1), unless the authorization is terminated  or revoked sooner.       Influenza A by PCR NEGATIVE NEGATIVE Final   Influenza B by PCR NEGATIVE NEGATIVE Final    Comment: (NOTE) The Xpert Xpress SARS-CoV-2/FLU/RSV plus assay is intended as an aid in the diagnosis of influenza from Nasopharyngeal swab specimens and should not be used as a sole basis for treatment. Nasal washings and aspirates are unacceptable for Xpert Xpress SARS-CoV-2/FLU/RSV testing.  Fact Sheet for Patients: EntrepreneurPulse.com.au  Fact Sheet for Healthcare Providers: IncredibleEmployment.be  This test is not yet approved or cleared by the [place] FDA and has been authorized for detection and/or diagnosis of SARS-CoV-2 by FDA under an Emergency Use Authorization (EUA). This EUA will remain in effect (meaning this test can be used) for the duration of the COVID-19 declaration under Section 564(b)(1) of the Act, 21 U.S.C. section 360bbb-3(b)(1), unless the authorization is terminated or revoked.  Performed at [facility], [place] [address] [place], [place] [address]          Radiology Studies: No results found.       Scheduled Meds:  aspirin EC  81 mg Oral QHS   atenolol  25 mg Oral QHS   atenolol  50 mg Oral Daily   citalopram  10 mg Oral QHS   enoxaparin (LOVENOX) injection  40 mg Subcutaneous Daily   fluticasone furoate-vilanterol  1 puff Inhalation Daily   And   umeclidinium bromide  1 puff Inhalation Daily   furosemide  40 mg Intravenous Q12H   gabapentin  300 mg Oral QHS   levothyroxine  112 mcg Oral QAC breakfast   omega-3 acid ethyl esters  1 g Oral Daily   pantoprazole  40 mg Oral Daily   potassium chloride  10  mEq Oral Daily   potassium chloride  40 mEq Oral Once   predniSONE  10 mg Oral Q breakfast   rosuvastatin  20 mg Oral Daily   Selexipag  1,400 mcg Oral BID   Selexipag  1,600 mcg Oral BID   sodium chloride flush  3 mL Intravenous Q12H   tadalafil (PAH)  40 mg Oral Daily   Continuous Infusions:   LOS: 9 days   Time spent= 35  mins    Rafeal Skibicki Arsenio Loader, MD [facility]  If 7PM-7AM, please contact night-coverage  01/11/2021, 11:48 AM

## 2021-01-11 NOTE — Progress Notes (Signed)
Physical Therapy Treatment Patient Details Name: Kimberly Irwin MRN: [id number] DOB: [birth year] Today's Date: 01/11/2021    History of Present Illness Pt is a 75 y.o. female admitted 01/02/21 with SOB. Workup for acute on chronic hypoxic respiratory failure, emphysema; required NRB, then BiPAP, then [place]. Weaned to HFNC 6/6. PMH includes COPD, sarcoidosis, HTN, HF, cardiomegaly, chronic respiratory failure (wears 7-8 L O2 baseline).   PT Comments    Pt slowly progressing with mobility. Pt requiring up to minA with short bouts of standing activity with RW, assist for ADL tasks. Pt remains limited by generalized weakness, decreased activity tolerance and impaired balance strategies. Noted pt declining recommendation for SNF-level therapies, plans to return home; pt will require increased assist from family for mobility and ADLs, as well as recommendation for HHPT services. Will continue to follow acutely to address established goals.  SpO2 92% on 9L O2 HFNC HR 50s-70s Post-mobility BP 113/75    Follow Up Recommendations  Home health PT;Supervision for mobility/OOB (pt declined SNF)     Equipment Recommendations  Wheelchair (measurements PT);Wheelchair cushion (measurements PT);3in1 (PT)    Recommendations for Other Services OT consult     Precautions / Restrictions Precautions Precautions: Fall;Other (comment) Precaution Comments: Watch SpO2 Restrictions Weight Bearing Restrictions: No    Mobility  Bed Mobility Overal bed mobility: Modified Independent Bed Mobility: Supine to Sit           General bed mobility comments: Increased time and effort, HOB elevated    Transfers Overall transfer level: Needs assistance Equipment used: Rolling walker (2 wheeled) Transfers: Sit to/from Stand Sit to Stand: Min guard         General transfer comment: Multiple sit<>stands from EOB and recliner, increased time and effort, initial verbal cues for hand placement (good carryover to  subsequent trials), heavy reliance on UE support to push into standing  Ambulation/Gait Ambulation/Gait assistance: Min guard;Min assist Gait Distance (Feet): 4 Feet Assistive device: Rolling walker (2 wheeled) Gait Pattern/deviations: Step-to pattern;Shuffle;Festinating;Trunk flexed Gait velocity: Decreased   General Gait Details: Slow, festinating-like gait with RW and min guard for balance, at times requiring minA for RW management, consistent verbal cues for sequencing; unsure if difficulty taking steps related to cognition or fatigue; pt endorses fatigue   Stairs             Wheelchair Mobility    Modified Rankin (Stroke Patients Only)       Balance Overall balance assessment: Needs assistance Sitting-balance support: Feet supported;Bilateral upper extremity supported;No upper extremity supported Sitting balance-Leahy Scale: Fair     Standing balance support: Bilateral upper extremity supported;During functional activity;Single extremity supported Standing balance-Leahy Scale: Poor Standing balance comment: Pt reliant on at least single UE support for standing balance when performing pericare; requiring assist for posterior pericare                            Cognition Arousal/Alertness: Awake/alert Behavior During Therapy: Flat affect Overall Cognitive Status: No family/caregiver present to determine baseline cognitive functioning                                 General Comments: WFL for majority of simple tasks, good awareness of PMH and current situation. Pt with slowed processing and difficulty sequencing when it comes to standing mobility tasks, requiring increased verbal cues; pt unable to explain why she feels like this is happening  Exercises Other Exercises Other Exercises: 3x repeated sit<>stand from recliner (prolonged seated rest break to recover SOB between trials); seated LAQ    General Comments General comments (skin  integrity, edema, etc.): SpO2 92% on 9L O2 [place], HR 50s-70s, post-mobility BP 113/75; pt endorses SOB, denies dizziness      Pertinent Vitals/Pain Pain Assessment: No/denies pain Pain Intervention(s): Monitored during session    Home Living                      Prior Function            PT Goals (current goals can now be found in the care plan section) Progress towards PT goals: Progressing toward goals (slowly)    Frequency    Min 3X/week      PT Plan Discharge plan needs to be updated    Co-evaluation              AM-PAC PT "6 Clicks" Mobility   Outcome Measure  Help needed turning from your back to your side while in a flat bed without using bedrails?: A Little Help needed moving from lying on your back to sitting on the side of a flat bed without using bedrails?: A Little Help needed moving to and from a bed to a chair (including a wheelchair)?: A Little Help needed standing up from a chair using your arms (e.g., wheelchair or bedside chair)?: A Little Help needed to walk in hospital room?: A Little Help needed climbing 3-5 steps with a railing? : A Lot 6 Click Score: 17    End of Session Equipment Utilized During Treatment: Oxygen Activity Tolerance: Patient limited by fatigue Patient left: in chair;with call bell/phone within reach Nurse Communication: Mobility status PT Visit Diagnosis: Unsteadiness on feet (R26.81);Muscle weakness (generalized) (M62.81)     Time: 3818-4037 PT Time Calculation (min) (ACUTE ONLY): 27 min  Charges:  $Therapeutic Activity: 23-37 mins                    Mabeline Caras, PT, DPT Acute Rehabilitation Services  Pager [phone] Office [place] 01/11/2021, 11:30 AM

## 2021-01-11 NOTE — Progress Notes (Signed)
NAME:  TACARRA JUSTO, MRN:  [id number], DOB:  08-13-1945, LOS: 9 ADMISSION DATE:  01/02/2021, CONSULTATION DATE:  01/04/2021 REFERRING MD:  Maylene Roes - TRH , CHIEF COMPLAINT:  SOB, hypoxia   History of Present Illness:  75 yo F PMH sarcoidosis (no active disease but remains on steroids for SOB), Emphysema, HFpEF , pulmonary HTN, Chronic hypoxic respiratory failure on 8LNC admitted to [facility] 6/1 with hypoxia after a fall from chair to knees, after which EMS was dispatched and brought pt to hospital due to hypoxia. Pt does endorse increasing SOB in the evening for about a week ever since her last Uptravi decrease. Also endorses an intermittent cough associated  with white mucus. Denied fever, chest pain, palpitations, sick contacts, new environmental exposures/allergens. CTA chest without evidence of PE. Exam findings most consistent with AECOPD, was started on solumedrol 6/2 and continued on BDs. COVID is negative, no RVP sent, not on abx. Since admission 6/1, O2 requirements have de-escalated from NRB , BiPAP to HFNC but have remained significantly higher than baseline. Currently on HFNC 80% FiO2 20 L/min PCCM consulted 6/3 for persistent hypoxia, inability to wean O2.     Of note, recent outpatient HRCT 10/08/20 with some fibrotic changes, paraseptal and centrilobular emphysema, diffuse bronchial wall thickening  In review of documentation from [facility] (where pt follows for pulmonary HTN) it looks like Uptravi was recently decreased from 2400 to 1700 to 1400 (last decrease approx 1week PTA)    Pertinent  Medical History  Sarcodisis  Pulmonary HTN HFpEF Emphysema  Chronic hypoxic respiratory failure  HTN HLD DDD S/p L3-4, 4-5posterior laminectomy, fusion, fixation GERD Hiatal hernia Chronic tremor  Hypothyroidism  Significant Hospital Events: Including procedures, antibiotic start and stop dates in addition to other pertinent events   6/1 admitted to [facility] for acute on chronic hypoxia  6/3 still on  HFNC, PCCM consulted for inability to wean O2  6/4 25L, 70% FiO2 6/6 weaned to 10L [place], did ok low 90s on 8L. RHC showed PVR 4.7 WU, CO and CI preserved, normal filling pressures. 6/8 Continues to require 10 - 12L of O2  Interim History / Subjective:   No acute issues overnight. Patient remained on 10L O2 since yesterday. Turned down to 9L while in room with no desaturations.  Objective   Blood pressure 93/71, pulse 60, temperature 97.6 F (36.4 C), temperature source Oral, resp. rate 19, height _0  (1.626 m), weight 75.2 kg, SpO2 94 %.        Intake/Output Summary (Last 24 hours) at 01/11/2021 1249 Last data filed at 01/11/2021 0948 Gross per 24 hour  Intake 3 ml  Output 800 ml  Net -797 ml   Filed Weights   01/06/21 1542 01/10/21 0245 01/11/21 0500  Weight: 77.7 kg 77.7 kg 75.2 kg    Examination: General: frail, chronically ill appearing woman lying in bed HENT: [place]/AT, eyes anicteric, moist mucous membranes Lungs: clear to auscultation bilaterally, no wheezing or rales. Cardiovascular: S1S2, RRR Abdomen: soft, non-tender, non-distended  Extremities:  No edema, warm Neuro: head bobbing/oral tremor, answering questions appropriately, globally weak Derm: pallor, no rashes  Labs/imaging that I have personally reviewed  (right click and "Reselect all SmartList Selections" daily)  HRCT 10/08/20 with some fibrotic changes, paraseptal and centrilobular emphysema, diffuse bronchial wall thickening   Out pt autoimmune/rheum labs -- unremarkable except ANA positive, speckled 1:40  CTA chest 6/2> no PE. Tortuous vessels. Patent major airways, low lung volumes. Centrolobular emphysema. Stable nodules.  6/10 BUN 50 Cr 1.4  Per [facility] records, prev PH workup: RHC:  11/2017: RA 2, RV 66/5, PA 66/16, mPAP 34, PAWP 3, PA saturation 60% AO 92% Cardiac Output (Fick) 3.73, Cardiac Index (Fick) 2.04, PVR 8.3 WU   05/2019: (mPAP 28 mmHg and PVR 2.75 WU)  V/Q scan: 12/2017: Normal  perfusion lung scan. Minimally diminished ventilation to the RIGHT apex.  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 - Start Uptravi 1600 mcg BID today. -Continue PT and incentive spirometry -Wean O2 as able, weaned to 9L today.   We will continue to follow.   JoFreda JacksonMD [facility] Office: [phone] See Amion for personal pager PCCM on call pager ([phone] 7pm. Please call Elink 7p-7a. [phone]

## 2021-01-12 LAB — BASIC METABOLIC PANEL
Anion gap: 11 (ref 5–15)
BUN: 46 mg/dL — ABNORMAL HIGH (ref 8–23)
CO2: 26 mmol/L (ref 22–32)
Calcium: 9.1 mg/dL (ref 8.9–10.3)
Chloride: 98 mmol/L (ref 98–111)
Creatinine, Ser: 1.16 mg/dL — ABNORMAL HIGH (ref 0.44–1.00)
GFR, Estimated: 49 mL/min — ABNORMAL LOW (ref >60)
Glucose, Bld: 106 mg/dL — ABNORMAL HIGH (ref 70–99)
Potassium: 4 mmol/L (ref 3.5–5.1)
Sodium: 135 mmol/L (ref 135–145)

## 2021-01-12 LAB — CBC
HCT: 41.6 % (ref 36.0–46.0)
Hemoglobin: 14.4 g/dL (ref 12.0–15.0)
MCH: 30.6 pg (ref 26.0–34.0)
MCHC: 34.6 g/dL (ref 30.0–36.0)
MCV: 88.3 fL (ref 80.0–100.0)
Platelets: 175 10*3/uL (ref 150–400)
RBC: 4.71 MIL/uL (ref 3.87–5.11)
RDW: 15.8 % — ABNORMAL HIGH (ref 11.5–15.5)
WBC: 15.9 10*3/uL — ABNORMAL HIGH (ref 4.0–10.5)
nRBC: 0 % (ref 0.0–0.2)

## 2021-01-12 LAB — MAGNESIUM: Magnesium: 2.2 mg/dL (ref 1.7–2.4)

## 2021-01-12 NOTE — Progress Notes (Signed)
PROGRESS NOTE    Kimberly Irwin  GQQ:[id number] DOB: 04-Nov-1945 DOA: 01/02/2021 PCP: Kimberly Lukes, MD   Brief Narrative:  75 y.o. female with medical history significant for COPD, sarcoidosis, HTN, HLD, HFpEF, cardiomegaly, chronic respiratory failure with hypoxia on oxygen at 7-8 L/min by [place] at home who presents for evaluation of worsening SOB. She presented by EMS on NRB and was started on Bipap in the ER.  Symptoms are combination of worsening pulmonary hypertension and COPD exacerbation.  Pulmonary team is following.  Kimberly Irwin was increased by pulmonary to 1600 mcg twice daily.   Assessment & Plan:   Principal Problem:   Acute on chronic respiratory failure with hypoxia (HCC) Active Problems:   Sarcoidosis stage I   Hyperlipidemia   Essential hypertension   Hypothyroidism   Cardiomegaly   Pulmonary HTN (HCC)   Chronic heart failure with preserved ejection fraction (HFpEF) (HCC)   COPD with acute exacerbation (HCC)   Prolonged QT interval   Acute on chronic respiratory failure with hypoxia, 9 L high flow -Symptoms are combination of worsening pulmonary hypertension, COPD exacerbation and underlying fibrosis.  Supplemental oxygen.  Baseline oxygen 8 L nasal cannula - Pulmonary team following - Continue bronchodilators, I-S/flutter, out of bed to chair - Uptravi increased to 1600 mg twice daily. - Tadalafil - Prednisone 34m x 1 weeks, then 535mx 1 week then off.  - Follow-up outpatient pulmonary.    COPD, chronic -Continue bronchodilators and prednisone as prescribed     Essential hypertension -Atenolol      Pulmonary HTN  -Status post RHC.  Continue tadalafil and Uptarvi    Chronic heart failure with preserved ejection fraction (HFpEF)  -No signs of fluid overload at this time.   -Patient does have cardiomegaly on chest x-ray. -2d echo with preserved EF noted, Pt is s/p RHC 6/6     Sarcoidosis stage I -On chronic prednisone at home.  Following PCCM.      Hyperlipidemia -Continue Crestor     Hypothyroidism -Daily Synthroid    T12 Compression fracture -Pain controlled.  PT recommended SNF, patient declined.  Will arrange for home health  DVT prophylaxis: Lovenox Code Status: DNR Family Communication:    Status is: Inpatient  Remains inpatient appropriate because:Inpatient level of care appropriate due to severity of illness   Dispo: The patient is from: Home              Anticipated d/c is to: Home              Patient currently is not medically stable to d/c.  Patient is still on 10L high flow nasal cannula, unsafe for discharge.  Baseline 8 L nasal cannula.  Will discharge once cleared by PCCM   Difficult to place patient No    Subjective: Really wants to go home but understadnds she cannot go until cleared by Pulm. No acute evnts overnight.   Review of Systems Otherwise negative except as per HPI, including: General = no fevers, chills, dizziness,  fatigue HEENT/EYES = negative for loss of vision, double vision, blurred vision,  sore throa Cardiovascular= negative for chest pain, palpitation Respiratory/lungs= negative for shortness of breath, cough, wheezing; hemoptysis,  Gastrointestinal= negative for nausea, vomiting, abdominal pain Genitourinary= negative for Dysuria MSK = Negative for arthralgia, myalgias Neurology= Negative for headache, numbness, tingling  Psychiatry= Negative for suicidal and homocidal ideation Skin= Negative for Rash   Examination: Constitutional: Not in acute distress, 9L [place] Respiratory: Clear to auscultation bilaterally Cardiovascular: Normal  sinus rhythm, no rubs Abdomen: Nontender nondistended good bowel sounds Musculoskeletal: No edema noted Skin: No rashes seen Neurologic: CN 2-12 grossly intact.  And nonfocal Psychiatric: Normal judgment and insight. Alert and oriented x 3. Normal mood.    Objective: Vitals:   01/11/21 1427 01/11/21 1929 01/11/21 2104 01/12/21 0413  BP: 104/65  99/64 (!) 87/56 97/61  Pulse: 66 64 65 60  Resp: 20 19    Temp:  98.3 F (36.8 C)  97.6 F (36.4 C)  TempSrc:  Oral  Oral  SpO2: 94% 95%  93%  Weight:      Height:        Intake/Output Summary (Last 24 hours) at 01/12/2021 1045 Last data filed at 01/12/2021 0927 Gross per 24 hour  Intake 290 ml  Output 950 ml  Net -660 ml   Filed Weights   01/06/21 1542 01/10/21 0245 01/11/21 0500  Weight: 77.7 kg 77.7 kg 75.2 kg     Data Reviewed:   CBC: Recent Labs  Lab 01/06/21 0212 01/07/21 0353 01/07/21 1327 01/12/21 0132  WBC 9.0 10.5  --  15.9*  HGB 13.6 14.8 15.3*  15.3* 14.4  HCT 41.0 44.5 45.0  45.0 41.6  MCV 90.7 89.9  --  88.3  PLT 195 210  --  242   Basic Metabolic Panel: Recent Labs  Lab 01/08/21 0359 01/09/21 0431 01/10/21 1020 01/11/21 0926 01/12/21 0132  NA 138 137 135 134* 135  K 3.6 3.3* 3.6 3.8 4.0  CL 95* 95* 95* 96* 98  CO2 33* _0 GLUCOSE 114* 103* 218* 161* 106*  BUN 49* 51* 48* 50* 46*  CREATININE 1.37* 1.31* 1.29* 1.40* 1.16*  CALCIUM 9.2 8.9 9.1 9.0 9.1  MG  --   --  2.2 2.2 2.2   GFR: Estimated Creatinine Clearance: 41 mL/min (A) (by C-G formula based on SCr of 1.16 mg/dL (H)). Liver Function Tests: Recent Labs  Lab 01/06/21 0212 01/07/21 0353 01/08/21 0359 01/09/21 0431  AST 39 45* 33 30  ALT 40 59* 47* 43  ALKPHOS 40 42 43 44  BILITOT 0.6 1.0 1.0 0.9  PROT 6.6 6.5 6.7 6.3*  ALBUMIN 3.5 3.6 3.5 3.4*   No results for input(s): LIPASE, AMYLASE in the last 168 hours. No results for input(s): AMMONIA in the last 168 hours. Coagulation Profile: No results for input(s): INR, PROTIME in the last 168 hours. Cardiac Enzymes: No results for input(s): CKTOTAL, CKMB, CKMBINDEX, TROPONINI in the last 168 hours. BNP (last 3 results) No results for input(s): PROBNP in the last 8760 hours. HbA1C: No results for input(s): HGBA1C in the last 72 hours. CBG: No results for input(s): GLUCAP in the last 168 hours. Lipid Profile: No  results for input(s): CHOL, HDL, LDLCALC, TRIG, CHOLHDL, LDLDIRECT in the last 72 hours. Thyroid Function Tests: No results for input(s): TSH, T4TOTAL, FREET4, T3FREE, THYROIDAB in the last 72 hours. Anemia Panel: No results for input(s): VITAMINB12, FOLATE, FERRITIN, TIBC, IRON, RETICCTPCT in the last 72 hours. Sepsis Labs: No results for input(s): PROCALCITON, LATICACIDVEN in the last 168 hours.  Recent Results (from the past 240 hour(s))  Resp Panel by RT-PCR (Flu A&B, Covid) Nasopharyngeal Swab     Status: None   Collection Time: 01/02/21  3:55 PM   Specimen: Nasopharyngeal Swab; Nasopharyngeal(NP) swabs in vial transport medium  Result Value Ref Range Status   SARS Coronavirus 2 by RT PCR NEGATIVE NEGATIVE Final    Comment: (NOTE) SARS-CoV-2 target nucleic acids  are NOT DETECTED.  The SARS-CoV-2 RNA is generally detectable in upper respiratory specimens during the acute phase of infection. The lowest concentration of SARS-CoV-2 viral copies this assay can detect is 138 copies/mL. A negative result does not preclude SARS-Cov-2 infection and should not be used as the sole basis for treatment or other patient management decisions. A negative result may occur with  improper specimen collection/handling, submission of specimen other than nasopharyngeal swab, presence of viral mutation(s) within the areas targeted by this assay, and inadequate number of viral copies(<138 copies/mL). A negative result must be combined with clinical observations, patient history, and epidemiological information. The expected result is Negative.  Fact Sheet for Patients:  EntrepreneurPulse.com.au  Fact Sheet for Healthcare Providers:  IncredibleEmployment.be  This test is no t yet approved or cleared by the [place] FDA and  has been authorized for detection and/or diagnosis of SARS-CoV-2 by FDA under an Emergency Use Authorization (EUA). This EUA will remain   in effect (meaning this test can be used) for the duration of the COVID-19 declaration under Section 564(b)(1) of the Act, 21 U.S.C.section 360bbb-3(b)(1), unless the authorization is terminated  or revoked sooner.       Influenza A by PCR NEGATIVE NEGATIVE Final   Influenza B by PCR NEGATIVE NEGATIVE Final    Comment: (NOTE) The Xpert Xpress SARS-CoV-2/FLU/RSV plus assay is intended as an aid in the diagnosis of influenza from Nasopharyngeal swab specimens and should not be used as a sole basis for treatment. Nasal washings and aspirates are unacceptable for Xpert Xpress SARS-CoV-2/FLU/RSV testing.  Fact Sheet for Patients: EntrepreneurPulse.com.au  Fact Sheet for Healthcare Providers: IncredibleEmployment.be  This test is not yet approved or cleared by the [place] FDA and has been authorized for detection and/or diagnosis of SARS-CoV-2 by FDA under an Emergency Use Authorization (EUA). This EUA will remain in effect (meaning this test can be used) for the duration of the COVID-19 declaration under Section 564(b)(1) of the Act, 21 U.S.C. section 360bbb-3(b)(1), unless the authorization is terminated or revoked.  Performed at [facility], [facility] [address], [place], [place] [address]          Radiology Studies: No results found.       Scheduled Meds:  aspirin EC  81 mg Oral QHS   atenolol  25 mg Oral QHS   atenolol  50 mg Oral Daily   citalopram  10 mg Oral QHS   enoxaparin (LOVENOX) injection  40 mg Subcutaneous Daily   fluticasone furoate-vilanterol  1 puff Inhalation Daily   And   umeclidinium bromide  1 puff Inhalation Daily   furosemide  40 mg Oral Daily   gabapentin  300 mg Oral QHS   levothyroxine  112 mcg Oral QAC breakfast   omega-3 acid ethyl esters  1 g Oral Daily   pantoprazole  40 mg Oral Daily   potassium chloride  10 mEq Oral Daily   predniSONE  10 mg Oral Q breakfast   rosuvastatin  20 mg  Oral Daily   Selexipag  1,600 mcg Oral BID   sodium chloride flush  3 mL Intravenous Q12H   tadalafil (PAH)  40 mg Oral Daily   Continuous Infusions:   LOS: 10 days   Time spent= 35 mins    Mathieu Schloemer Arsenio Loader, MD [facility]  If 7PM-7AM, please contact night-coverage  01/12/2021, 10:45 AM

## 2021-01-12 NOTE — Plan of Care (Signed)

## 2021-01-12 NOTE — Evaluation (Signed)
Occupational Therapy Evaluation Patient Details Name: Kimberly Irwin MRN: [id number] DOB: 1945/09/26 Today's Date: 01/12/2021    History of Present Illness Pt is a 75 y.o. female admitted 01/02/21 with SOB. Workup for acute on chronic hypoxic respiratory failure, emphysema; required NRB, then BiPAP, then [place]. Weaned to HFNC 6/6. PMH includes COPD, sarcoidosis, HTN, HF, cardiomegaly, chronic respiratory failure (wears 7-8 L O2 baseline).   Clinical Impression   PTA patient reports independent with ADLs, mobility using cane at times. Admitted for above and presenting with problem list below, including impaired activity tolerance, generalized weakness, impaired balance and decreased problem solving.  Pt oriented and following commands, requires increased time for processing and sequencing.  She completes transfers with min assist fading to min guard using RW, ADLs with up to min assist.  Requires increased time for all activities, fatigues easily.  On 9L HFNC during session with VSS. Will follow acutely and recommend continued HHOT services after dc to optimize independence and safety.     Follow Up Recommendations  Home health OT;Supervision/Assistance - 24 hour    Equipment Recommendations  3 in 1 bedside commode    Recommendations for Other Services       Precautions / Restrictions Precautions Precautions: Fall;Other (comment) Precaution Comments: Watch SpO2 Restrictions Weight Bearing Restrictions: No      Mobility Bed Mobility Overal bed mobility: Needs Assistance Bed Mobility: Supine to Sit     Supine to sit: Supervision     General bed mobility comments: for safety, increased time    Transfers Overall transfer level: Needs assistance Equipment used: Rolling walker (2 wheeled) Transfers: Sit to/from Stand Sit to Stand: Min guard;Min assist         General transfer comment: min assist fading to min guard, cueing for hand placement and safety    Balance Overall  balance assessment: Needs assistance Sitting-balance support: Feet supported;Bilateral upper extremity supported;No upper extremity supported Sitting balance-Leahy Scale: Fair Sitting balance - Comments: min guard fading to supervision   Standing balance support: Bilateral upper extremity supported;During functional activity Standing balance-Leahy Scale: Poor Standing balance comment: relies on UB support                           ADL either performed or assessed with clinical judgement   ADL Overall ADL's : Needs assistance/impaired     Grooming: Set up;Sitting   Upper Body Bathing: Set up;Sitting   Lower Body Bathing: Minimal assistance;Sit to/from stand   Upper Body Dressing : Set up;Sitting   Lower Body Dressing: Minimal assistance;Sit to/from stand   Toilet Transfer: Minimal assistance;Ambulation;RW Toilet Transfer Details (indicate cue type and reason): simulated to recliner Toileting- Clothing Manipulation and Hygiene: Total assistance;Sit to/from stand Toileting - Clothing Manipulation Details (indicate cue type and reason): posterior hygiene to ensure cleaniness with slight incontience in bed     Functional mobility during ADLs: Minimal assistance;Rolling walker;Cueing for safety General ADL Comments: pt limited by impaired balance, decreased activity tolerance, safety, and cardiopulmonary status     Vision   Vision Assessment?: No apparent visual deficits     Perception     Praxis      Pertinent Vitals/Pain Pain Assessment: No/denies pain     Hand Dominance Right   Extremity/Trunk Assessment Upper Extremity Assessment Upper Extremity Assessment: Generalized weakness   Lower Extremity Assessment Lower Extremity Assessment: Defer to PT evaluation   Cervical / Trunk Assessment Cervical / Trunk Assessment: Kyphotic   Communication Communication  Communication: No difficulties   Cognition Arousal/Alertness: Awake/alert Behavior During  Therapy: Flat affect Overall Cognitive Status: No family/caregiver present to determine baseline cognitive functioning Area of Impairment: Problem solving;Awareness;Safety/judgement                         Safety/Judgement: Decreased awareness of safety Awareness: Emergent Problem Solving: Slow processing;Decreased initiation;Requires verbal cues General Comments: pt with decreased safety awareness for lines, slow processing noted   General Comments  SpO2 90% on 9L HFNC, VSS    Exercises     Shoulder Instructions      Home Living Family/patient expects to be discharged to:: Private residence Living Arrangements: Children (daughter lives with pt, works during day) Available Help at Discharge: Family;Available 24 hours/day (cousin for the first 3 days) Type of Home: House Home Access: Ramped entrance     Home Layout: One level     Bathroom Shower/Tub: Teacher, early years/pre: Handicapped height     Home Equipment: Cane - single point;Walker - 2 wheels;Bedside commode;Shower seat;Grab bars - tub/shower;Walker - 4 wheels (8L home O2)          Prior Functioning/Environment Level of Independence: Independent with assistive device(s)        Comments: independent ADLs, using cane at times        OT Problem List: Decreased strength;Decreased activity tolerance;Impaired balance (sitting and/or standing);Decreased safety awareness;Decreased knowledge of use of DME or AE;Decreased knowledge of precautions;Cardiopulmonary status limiting activity;Obesity      OT Treatment/Interventions: Self-care/ADL training;DME and/or AE instruction;Therapeutic activities;Cognitive remediation/compensation;Balance training;Patient/family education;Therapeutic exercise    OT Goals(Current goals can be found in the care plan section) Acute Rehab OT Goals Patient Stated Goal: to go home OT Goal Formulation: With patient Time For Goal Achievement: 01/26/21 Potential to  Achieve Goals: Good  OT Frequency: Min 2X/week   Barriers to D/C:            Co-evaluation              AM-PAC OT "6 Clicks" Daily Activity     Outcome Measure Help from another person eating meals?: A Little Help from another person taking care of personal grooming?: A Little Help from another person toileting, which includes using toliet, bedpan, or urinal?: Total Help from another person bathing (including washing, rinsing, drying)?: A Little Help from another person to put on and taking off regular upper body clothing?: A Little Help from another person to put on and taking off regular lower body clothing?: A Little 6 Click Score: 16   End of Session Equipment Utilized During Treatment: Rolling walker;Oxygen (9L HFNC) Nurse Communication: Mobility status  Activity Tolerance: Patient tolerated treatment well Patient left: in chair;with call bell/phone within reach;with chair alarm set  OT Visit Diagnosis: Other abnormalities of gait and mobility (R26.89);Muscle weakness (generalized) (M62.81)                Time: 3614-4315 OT Time Calculation (min): 28 min Charges:  OT General Charges $OT Visit: 1 Visit OT Evaluation $OT Eval Moderate Complexity: 1 Mod OT Treatments $Self Care/Home Management : 8-22 mins  Jolaine Artist, OT Acute Rehabilitation Services Pager [phone] Office [phone]   Delight Stare 01/12/2021, 12:57 PM

## 2021-01-12 NOTE — Progress Notes (Addendum)
NAME:  Kimberly Irwin, MRN:  [id number], DOB:  1946/06/23, LOS: 84 ADMISSION DATE:  01/02/2021, CONSULTATION DATE:  01/04/2021 REFERRING MD:  Maylene Roes - TRH , CHIEF COMPLAINT:  SOB, hypoxia   History of Present Illness:  75 yo F PMH sarcoidosis (no active disease but remains on steroids for SOB), Emphysema, HFpEF , pulmonary HTN, Chronic hypoxic respiratory failure on 8LNC admitted to [facility] 6/1 with hypoxia after a fall from chair to knees, after which EMS was dispatched and brought pt to hospital due to hypoxia. Pt does endorse increasing SOB in the evening for about a week ever since her last Uptravi decrease. Also endorses an intermittent cough associated  with white mucus. Denied fever, chest pain, palpitations, sick contacts, new environmental exposures/allergens. CTA chest without evidence of PE. Exam findings most consistent with AECOPD, was started on solumedrol 6/2 and continued on BDs. COVID is negative, no RVP sent, not on abx. Since admission 6/1, O2 requirements have de-escalated from NRB , BiPAP to HFNC but have remained significantly higher than baseline. Currently on HFNC 80% FiO2 20 L/min PCCM consulted 6/3 for persistent hypoxia, inability to wean O2.    Of note, recent outpatient HRCT 10/08/20 with some fibrotic changes, paraseptal and centrilobular emphysema, diffuse bronchial wall thickening  In review of documentation from [facility] (where pt follows for pulmonary HTN) it looks like Uptravi was recently decreased from 2400 to 1700 to 1400 (last decrease approx 1week PTA)   Pertinent  Medical History  Sarcodisis  Pulmonary HTN HFpEF Emphysema  Chronic hypoxic respiratory failure  HTN HLD DDD S/p L3-4, 4-5posterior laminectomy, fusion, fixation GERD Hiatal hernia Chronic tremor  Hypothyroidism  Significant Hospital Events: Including procedures, antibiotic start and stop dates in addition to other pertinent events   6/1 admitted to [facility] for acute on chronic hypoxia  6/3 still on HFNC,  PCCM consulted for inability to wean O2  6/4 25L, 70% FiO2 6/6 weaned to 10L [place], did ok low 90s on 8L. RHC showed PVR 4.7 WU, CO and CI preserved, normal filling pressures. 6/8 Continues to require 10 - 12L of O2 6/10 Uptravi titrated to max dose of 1600 mcg bid  Interim History / Subjective:   No acute issues overnight. Patient remained on 10L O2 since yesterday. Turned down to 9L while in room with no desaturations.  Objective   Blood pressure 97/61, pulse 60, temperature 97.6 F (36.4 C), temperature source Oral, resp. rate 19, height _0  (1.626 m), weight 75.2 kg, SpO2 93 %.        Intake/Output Summary (Last 24 hours) at 01/12/2021 1551 Last data filed at 01/12/2021 0927 Gross per 24 hour  Intake 240 ml  Output 950 ml  Net -710 ml   Filed Weights   01/06/21 1542 01/10/21 0245 01/11/21 0500  Weight: 77.7 kg 77.7 kg 75.2 kg    Examination: Blood pressure 97/61, pulse 60, temperature 97.6 F (36.4 C), temperature source Oral, resp. rate 19, height _1  (1.626 m), weight 75.2 kg, SpO2 93 %. Gen:      No acute distress, frail, elderly HEENT:  EOMI, sclera anicteric Neck:     No masses; no thyromegaly Lungs:    Clear to auscultation bilaterally; normal respiratory effort CV:         Regular rate and rhythm; no murmurs Abd:      + bowel sounds; soft, non-tender; no palpable masses, no distension Ext:    No edema; adequate peripheral perfusion Skin:  Warm and dry; no rash Neuro: alert and oriented x 3 Psych: normal mood and affect   Labs/imaging that I have personally reviewed  (right click and "Reselect all SmartList Selections" daily)  HRCT 10/08/20 with some fibrotic changes, paraseptal and centrilobular emphysema, diffuse bronchial wall thickening   Out pt autoimmune/rheum labs -- unremarkable except ANA positive, speckled 1:40  CTA chest 6/2> no PE. Tortuous vessels. Patent major airways, low lung volumes. Centrolobular emphysema. Stable nodules.     6/10 BUN 50 Cr 1.4  Per [facility] records, prev PH workup: RHC:  11/2017: RA 2, RV 66/5, PA 66/16, mPAP 34, PAWP 3, PA saturation 60% AO 92% Cardiac Output (Fick) 3.73, Cardiac Index (Fick) 2.04, PVR 8.3 WU   05/2019: (mPAP 28 mmHg and PVR 2.75 WU)  V/Q scan: 12/2017: Normal perfusion lung scan. Minimally diminished ventilation to the RIGHT apex.  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 daily.  She appears euvolemic PT, incentive spirometry Wean O2 as tolerated  PCCM will follow back on Monday 6/13.  Please call with any questions.  Marshell Garfinkel MD [facility] See Amion for pager  If no response to pager , please call [phone] until 7pm After 7:00 pm call Elink  [phone] 01/12/2021, 3:56 PM

## 2021-01-13 LAB — BASIC METABOLIC PANEL
Anion gap: 12 (ref 5–15)
BUN: 41 mg/dL — ABNORMAL HIGH (ref 8–23)
CO2: 27 mmol/L (ref 22–32)
Calcium: 9.1 mg/dL (ref 8.9–10.3)
Chloride: 97 mmol/L — ABNORMAL LOW (ref 98–111)
Creatinine, Ser: 1.2 mg/dL — ABNORMAL HIGH (ref 0.44–1.00)
GFR, Estimated: 47 mL/min — ABNORMAL LOW (ref >60)
Glucose, Bld: 85 mg/dL (ref 70–99)
Potassium: 3.7 mmol/L (ref 3.5–5.1)
Sodium: 136 mmol/L (ref 135–145)

## 2021-01-13 LAB — CBC
HCT: 39 % (ref 36.0–46.0)
Hemoglobin: 13.3 g/dL (ref 12.0–15.0)
MCH: 30 pg (ref 26.0–34.0)
MCHC: 34.1 g/dL (ref 30.0–36.0)
MCV: 87.8 fL (ref 80.0–100.0)
Platelets: 167 10*3/uL (ref 150–400)
RBC: 4.44 MIL/uL (ref 3.87–5.11)
RDW: 15.6 % — ABNORMAL HIGH (ref 11.5–15.5)
WBC: 13.1 10*3/uL — ABNORMAL HIGH (ref 4.0–10.5)
nRBC: 0 % (ref 0.0–0.2)

## 2021-01-13 LAB — MAGNESIUM: Magnesium: 2.1 mg/dL (ref 1.7–2.4)

## 2021-01-13 MED ORDER — CITALOPRAM HYDROBROMIDE 20 MG PO TABS: 10.0000 mg | ORAL_TABLET | Freq: Every day | ORAL | Status: DC

## 2021-01-13 NOTE — Progress Notes (Signed)
PROGRESS NOTE    Kimberly Irwin  WNI:[id number] DOB: [birth year] DOA: 01/02/2021 PCP: Mosie Lukes, MD   Brief Narrative:  75 y.o. female with medical history significant for COPD, sarcoidosis, HTN, HLD, HFpEF, cardiomegaly, chronic respiratory failure with hypoxia on oxygen at 7-8 L/min by [place] at home who presents for evaluation of worsening SOB. She presented by EMS on NRB and was started on Bipap in the ER.  Symptoms are combination of worsening pulmonary hypertension and COPD exacerbation.  Pulmonary team is following.  [name] was increased by pulmonary to 1600 mcg twice daily.   Assessment & Plan:   Principal Problem:   Acute on chronic respiratory failure with hypoxia (HCC) Active Problems:   Sarcoidosis stage I   Hyperlipidemia   Essential hypertension   Hypothyroidism   Cardiomegaly   Pulmonary HTN (HCC)   Chronic heart failure with preserved ejection fraction (HFpEF) (HCC)   COPD with acute exacerbation (HCC)   Prolonged QT interval   Acute on chronic respiratory failure with hypoxia, 9 L high flow -Symptoms are combination of worsening pulmonary hypertension, COPD exacerbation and underlying fibrosis.  Supplemental oxygen.  Baseline oxygen 8 L nasal cannula - Pulmonary following - Continue bronchodilators, I-S/flutter, out of bed to chair - Uptravi increased to 1600 mg twice daily. - Tadalafil - Prednisone 97m x 1 weeks, then 570mx 1 week then off.     COPD, chronic -Continue bronchodilators and prednisone as prescribed     Essential hypertension -Atenolol      Pulmonary HTN  -Status post RHC.  Continue tadalafil and Uptarvi    Chronic heart failure with preserved ejection fraction (HFpEF)  -No signs of fluid overload at this time.   -Patient does have cardiomegaly on chest x-ray. -2d echo with preserved EF noted, Pt is s/p RHC 6/6 - Lasix 40 mg daily     Sarcoidosis stage I -On chronic prednisone at home.  Following PCCM.     Hyperlipidemia -Continue  Crestor     Hypothyroidism -Daily Synthroid    T12 Compression fracture -Pain controlled.  PT recommended SNF, patient declined.  Will arrange for home health  DVT prophylaxis: Lovenox Code Status: DNR Family Communication:    Status is: Inpatient  Remains inpatient appropriate because:Inpatient level of care appropriate due to severity of illness   Dispo: The patient is from: Home              Anticipated d/c is to: Home              Patient currently is not medically stable to d/c.  Maintain hospital stay until cleared by pulmonary team.   Difficult to place patient No    Subjective: Some exertional dyspnea.  Overall feels okay.  While I was in the room I reduced her oxygen to 8 L and she was saturating about 90%.  Review of Systems Otherwise negative except as per HPI, including: General: Denies fever, chills, night sweats or unintended weight loss. Resp: Denies cough, wheezing, shortness of breath. Cardiac: Denies chest pain, palpitations, orthopnea, paroxysmal nocturnal dyspnea. GI: Denies abdominal pain, nausea, vomiting, diarrhea or constipation GU: Denies dysuria, frequency, hesitancy or incontinence MS: Denies muscle aches, joint pain or swelling Neuro: Denies headache, neurologic deficits (focal weakness, numbness, tingling), abnormal gait Psych: Denies anxiety, depression, SI/HI/AVH Skin: Denies new rashes or lesions ID: Denies sick contacts, exotic exposures, travel   Examination: Constitutional: Not in acute distress, 8 L nasal cannula Respiratory: Mild bibasilar crackles Cardiovascular: Normal sinus rhythm,  no rubs Abdomen: Nontender nondistended good bowel sounds Musculoskeletal: No edema noted Skin: No rashes seen Neurologic: CN 2-12 grossly intact.  And nonfocal Psychiatric: Normal judgment and insight. Alert and oriented x 3. Normal mood.   Objective: Vitals:   01/12/21 1608 01/12/21 2037 01/13/21 0425 01/13/21 0815  BP:  104/73 104/62    Pulse:  65 (!) 54   Resp:  17 18   Temp:  97.6 F (36.4 C) 97.6 F (36.4 C)   TempSrc:  Axillary Axillary   SpO2: 91% 93% 90% 97%  Weight:      Height:        Intake/Output Summary (Last 24 hours) at 01/13/2021 0850 Last data filed at 01/12/2021 2038 Gross per 24 hour  Intake 240 ml  Output 800 ml  Net -560 ml   Filed Weights   01/06/21 1542 01/10/21 0245 01/11/21 0500  Weight: 77.7 kg 77.7 kg 75.2 kg     Data Reviewed:   CBC: Recent Labs  Lab 01/07/21 0353 01/07/21 1327 01/12/21 0132 01/13/21 0313  WBC 10.5  --  15.9* 13.1*  HGB 14.8 15.3*  15.3* 14.4 13.3  HCT 44.5 45.0  45.0 41.6 39.0  MCV 89.9  --  88.3 87.8  PLT 210  --  175 035   Basic Metabolic Panel: Recent Labs  Lab 01/09/21 0431 01/10/21 1020 01/11/21 0926 01/12/21 0132 01/13/21 0313  NA 137 135 134* 135 136  K 3.3* 3.6 3.8 4.0 3.7  CL 95* 95* 96* 98 97*  CO2 _0 GLUCOSE 103* 218* 161* 106* 85  BUN 51* 48* 50* 46* 41*  CREATININE 1.31* 1.29* 1.40* 1.16* 1.20*  CALCIUM 8.9 9.1 9.0 9.1 9.1  MG  --  2.2 2.2 2.2 2.1   GFR: Estimated Creatinine Clearance: 39.6 mL/min (A) (by C-G formula based on SCr of 1.2 mg/dL (H)). Liver Function Tests: Recent Labs  Lab 01/07/21 0353 01/08/21 0359 01/09/21 0431  AST 45* 33 30  ALT 59* 47* 43  ALKPHOS 42 43 44  BILITOT 1.0 1.0 0.9  PROT 6.5 6.7 6.3*  ALBUMIN 3.6 3.5 3.4*   No results for input(s): LIPASE, AMYLASE in the last 168 hours. No results for input(s): AMMONIA in the last 168 hours. Coagulation Profile: No results for input(s): INR, PROTIME in the last 168 hours. Cardiac Enzymes: No results for input(s): CKTOTAL, CKMB, CKMBINDEX, TROPONINI in the last 168 hours. BNP (last 3 results) No results for input(s): PROBNP in the last 8760 hours. HbA1C: No results for input(s): HGBA1C in the last 72 hours. CBG: No results for input(s): GLUCAP in the last 168 hours. Lipid Profile: No results for input(s): CHOL, HDL, LDLCALC, TRIG,  CHOLHDL, LDLDIRECT in the last 72 hours. Thyroid Function Tests: No results for input(s): TSH, T4TOTAL, FREET4, T3FREE, THYROIDAB in the last 72 hours. Anemia Panel: No results for input(s): VITAMINB12, FOLATE, FERRITIN, TIBC, IRON, RETICCTPCT in the last 72 hours. Sepsis Labs: No results for input(s): PROCALCITON, LATICACIDVEN in the last 168 hours.  No results found for this or any previous visit (from the past 240 hour(s)).        Radiology Studies: No results found.       Scheduled Meds:  aspirin EC  81 mg Oral QHS   atenolol  25 mg Oral QHS   atenolol  50 mg Oral Daily   citalopram  10 mg Oral QHS   enoxaparin (LOVENOX) injection  40 mg Subcutaneous Daily   fluticasone furoate-vilanterol  1 puff Inhalation Daily   And   umeclidinium bromide  1 puff Inhalation Daily   furosemide  40 mg Oral Daily   gabapentin  300 mg Oral QHS   levothyroxine  112 mcg Oral QAC breakfast   omega-3 acid ethyl esters  1 g Oral Daily   pantoprazole  40 mg Oral Daily   potassium chloride  10 mEq Oral Daily   predniSONE  10 mg Oral Q breakfast   rosuvastatin  20 mg Oral Daily   Selexipag  1,600 mcg Oral BID   sodium chloride flush  3 mL Intravenous Q12H   tadalafil (PAH)  40 mg Oral Daily   Continuous Infusions:   LOS: 11 days   Time spent= 35 mins    Kishaun Erekson Arsenio Loader, MD [facility]  If 7PM-7AM, please contact night-coverage  01/13/2021, 8:50 AM

## 2021-01-14 ENCOUNTER — Telehealth: Payer: Self-pay | Admitting: Critical Care Medicine

## 2021-01-14 DIAGNOSIS — J438 Other emphysema: Secondary | ICD-10-CM

## 2021-01-14 LAB — MAGNESIUM: Magnesium: 2.1 mg/dL (ref 1.7–2.4)

## 2021-01-14 LAB — BASIC METABOLIC PANEL
Anion gap: 9 (ref 5–15)
BUN: 31 mg/dL — ABNORMAL HIGH (ref 8–23)
CO2: 27 mmol/L (ref 22–32)
Calcium: 8.7 mg/dL — ABNORMAL LOW (ref 8.9–10.3)
Chloride: 100 mmol/L (ref 98–111)
Creatinine, Ser: 1.19 mg/dL — ABNORMAL HIGH (ref 0.44–1.00)
GFR, Estimated: 47 mL/min — ABNORMAL LOW (ref >60)
Glucose, Bld: 93 mg/dL (ref 70–99)
Potassium: 3.4 mmol/L — ABNORMAL LOW (ref 3.5–5.1)
Sodium: 136 mmol/L (ref 135–145)

## 2021-01-14 LAB — CBC
HCT: 36.7 % (ref 36.0–46.0)
Hemoglobin: 12.6 g/dL (ref 12.0–15.0)
MCH: 30.4 pg (ref 26.0–34.0)
MCHC: 34.3 g/dL (ref 30.0–36.0)
MCV: 88.6 fL (ref 80.0–100.0)
Platelets: 161 10*3/uL (ref 150–400)
RBC: 4.14 MIL/uL (ref 3.87–5.11)
RDW: 15.5 % (ref 11.5–15.5)
WBC: 11.1 10*3/uL — ABNORMAL HIGH (ref 4.0–10.5)
nRBC: 0 % (ref 0.0–0.2)

## 2021-01-14 LAB — BRAIN NATRIURETIC PEPTIDE: B Natriuretic Peptide: 177.2 pg/mL — ABNORMAL HIGH (ref 0.0–100.0)

## 2021-01-14 MED ORDER — PREDNISONE 10 MG PO TABS: ORAL_TABLET | ORAL | 0 refills | Status: AC

## 2021-01-14 MED ORDER — FLUTICASONE FUROATE-VILANTEROL 100-25 MCG/INH IN AEPB: 1.0000 | INHALATION_SPRAY | Freq: Every day | RESPIRATORY_TRACT | 0 refills | Status: DC

## 2021-01-14 MED ORDER — TRELEGY ELLIPTA 100-62.5-25 MCG/INH IN AEPB: 1.0000 | INHALATION_SPRAY | Freq: Every day | RESPIRATORY_TRACT | 0 refills | Status: DC

## 2021-01-14 MED ORDER — POTASSIUM CHLORIDE 20 MEQ PO PACK
40.0000 meq | PACK | Freq: Once | ORAL | Status: AC
  Administered 2021-01-14: 40 meq via ORAL
  Filled 2021-01-14: qty 2

## 2021-01-14 MED ORDER — FUROSEMIDE 40 MG PO TABS: 40.0000 mg | ORAL_TABLET | Freq: Every day | ORAL | 0 refills | Status: DC

## 2021-01-14 MED ORDER — SELEXIPAG 1600 MCG PO TABS: 1600.0000 ug | ORAL_TABLET | Freq: Two times a day (BID) | ORAL | Status: AC

## 2021-01-14 MED ORDER — TRAMADOL HCL 50 MG PO TABS: 50.0000 mg | ORAL_TABLET | Freq: Four times a day (QID) | ORAL | 0 refills | Status: DC | PRN

## 2021-01-14 MED ORDER — UMECLIDINIUM BROMIDE 62.5 MCG/INH IN AEPB: 1.0000 | INHALATION_SPRAY | Freq: Every day | RESPIRATORY_TRACT | 0 refills | Status: DC

## 2021-01-14 NOTE — Plan of Care (Signed)

## 2021-01-14 NOTE — Progress Notes (Signed)
NAME:  Kimberly Irwin, MRN:  [id number], DOB:  May 01, 1946, LOS: 12 ADMISSION DATE:  01/02/2021, CONSULTATION DATE:  01/04/2021 REFERRING MD:  Maylene Roes - TR[facility] , CHIEF COMPLAINT:  SOB, hypoxia   History of Present Illness:  75 yo F PMH sarcoidosis (no active disease but remains on steroids for SOB), Emphysema, HFpEF , pulmonary HTN, Chronic hypoxic respiratory failure on 8LNC admitted to [facility] 6/1 with hypoxia after a fall from chair to knees, after which EMS was dispatched and brought pt to hospital due to hypoxia. Pt does endorse increasing SOB in the evening for about a week ever since her last Uptravi decrease. Also endorses an intermittent cough associated  with white mucus. Denied fever, chest pain, palpitations, sick contacts, new environmental exposures/allergens. CTA chest without evidence of PE. Exam findings most consistent with AECOPD, was started on solumedrol 6/2 and continued on BDs. COVID is negative, no RVP sent, not on abx. Since admission 6/1, O2 requirements have de-escalated from NRB, BiPAP to HFNC but have remained significantly higher than baseline. Currently on HFNC 80% FiO2 20 L/min PCCM consulted 6/3 for persistent hypoxia, inability to wean O2.    Of note, recent outpatient HRCT 10/08/20 with some fibrotic changes, paraseptal and centrilobular emphysema, diffuse bronchial wall thickening  In review of documentation from [facility] (where pt follows for pulmonary HTN) it looks like Uptravi was recently decreased from 2400 to 1700 to 1400 (last decrease approx 1week PTA)   Pertinent  Medical History  Sarcodisis  Pulmonary HTN HFpEF Emphysema  Chronic hypoxic respiratory failure  HTN HLD DDD S/p L3-4, 4-5posterior laminectomy, fusion, fixation GERD Hiatal hernia Chronic tremor  Hypothyroidism  Significant Hospital Events: Including procedures, antibiotic start and stop dates in addition to other pertinent events   6/1 admitted to [facility] for acute on chronic hypoxia  6/3 still on HFNC,  PCCM consulted for inability to wean O2  6/4 25L, 70% FiO2 6/6 weaned to 10L [place], did ok low 90s on 8L. RHC showed PVR 4.7 WU, CO and CI preserved, normal filling pressures. 6/8 Continues to require 10 - 12L of O2 6/10 Uptravi titrated to max dose of 1600 mcg bid 6/13 down to 8L O2 at rest  Interim History / Subjective:  Ms. Tobler feels well. No new symptoms. Down to her home 8L.  Objective   Blood pressure 90/61, pulse 60, temperature 98.9 F (37.2 C), temperature source Axillary, resp. rate 16, height _0  (1.626 m), weight 75.2 kg, SpO2 92 %.        Intake/Output Summary (Last 24 hours) at 01/14/2021 0803 Last data filed at 01/13/2021 1630 Gross per 24 hour  Intake --  Output 1200 ml  Net -1200 ml    Filed Weights   01/06/21 1542 01/10/21 0245 01/11/21 0500  Weight: 77.7 kg 77.7 kg 75.2 kg    Examination: BP 90/61 (BP Location: Left Arm)   Pulse 60 Comment: manual check by nurse  Temp 98.9 F (37.2 C) (Axillary)   Resp 16 Comment: pt woke up  Ht _1  (1.626 m)   Wt 75.2 kg   SpO2 92%   BMI 28.46 kg/m   Gen:      Chronically ill appearing woman sitting up in bed playing on her ipad HEENT:  [place]/AT, eyes anicteric Neck:     No JVD Lungs:    Breathing comfortably on 8 L, accessory muscle use.  No conversational dyspnea.  Clear to auscultation bilaterally. CV:  S1-S2, regular rate and rhythm Abd:      Obese, soft, nontender Ext:    No peripheral edema, no cyanosis Skin:      No rashes, warm and dry Neuro: Alert, answering questions appropriately, moving all extremities. Psych: Cooperative with exam   Labs/imaging that I have personally reviewed  (right click and "Reselect all SmartList Selections" daily)   BUN 31 Cr 1.19 BNP pending   Per [facility] records, prev PH workup: RHC:  11/2017: RA 2, RV 66/5, PA 66/16, mPAP 34, PAWP 3, PA saturation 60% AO 92% Cardiac Output (Fick) 3.73, Cardiac Index (Fick) 2.04, PVR 8.3 WU   05/2019: (mPAP 28 mmHg and PVR  2.75 WU)  V/Q scan: 12/2017: Normal perfusion lung scan. Minimally diminished ventilation to the RIGHT apex.  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 Continue prednisone taper 10 mg daily x 1 week then 5 mg x 1 week, then off Continue tadalafil 40 mg daily. Continue Uptravi at 1628m bid (increased from 1400 after recent down-titration).  Will need ongoing outpatient titration. Continue Lasix 40 mg p.o. daily.  Recommend discharge on increased Lasix regimen at 40 mg once daily with close follow-up of kidney function and electrolytes.  PT, incentive spirometry On home 8L oxygen- stable for discharge from pulmonary standpoint. Checking BNP today to establish her euvolemic baseline. Follow up appointment has been requested with Dr. HSilas Floodfor the next ~2 weeks.  Discussed plan with Dr. AReesa Chew  LJulian Hy DO 01/14/21 10:04 AM [facility]

## 2021-01-14 NOTE — Telephone Encounter (Signed)
Please schedule a hospital follow up appointment with Dr. Silas Flood later this month. Thanks!  Julian Hy, DO 01/14/21 10:08 AM [facility]

## 2021-01-14 NOTE — Progress Notes (Signed)
Pt. Discharged home with daughter. AVS provided and reviewed with Pt. All questions answered.

## 2021-01-14 NOTE — Telephone Encounter (Signed)
MH,   This patient was scheduled with you as a consult at 12pm on 01/24/21. Generally we don't schedule consults at 12pm. Are you ok with this?

## 2021-01-14 NOTE — Telephone Encounter (Signed)
That is fine - can be f/u visit as I saw her in hospital.

## 2021-01-14 NOTE — Progress Notes (Signed)
PT Cancellation Note  Patient Details Name: Kimberly Irwin MRN: [id number] DOB: 1946-02-05   Cancelled Treatment:    Reason Eval/Treat Not Completed: Other (comment) (Pt states she is leaving today.  Note on 6/10 by PT recommended wheelchair (18x16 with desk armrests, anti-tippers and foot rests)  and pressure relieving cushion (18x16) as well as 3N1.  Will need HHPT f/u.  Has 24 hour care as well and was on O2 PTA.  Declines to work with PT today to save energy for home.)   [name] 01/14/2021, 10:23 AM Adreyan Carbajal M,PT Acute Rehab Services [phone] (pager)

## 2021-01-14 NOTE — Discharge Summary (Signed)
Physician Discharge Summary  Kimberly Irwin BDZ:[id number] DOB: Jan 04, 1946 DOA: 01/02/2021  PCP: Mosie Lukes, MD  Admit date: 01/02/2021 Discharge date: 01/14/2021  Admitted From: Home Disposition: Home with home health  Recommendations for Outpatient Follow-up:  Follow up with PCP in 1-2 weeks Please obtain BMP/magnesium in one week your next doctors visit.  Daily trilogy ordered 1 inhalation daily Prednisone 10 mg daily for 1 week followed by 5 mg daily Outpatient follow-up appointment in 2-3 weeks, will be arranged by pulmonary service Lasix changed to 40 mg daily Continue home supplemental oxygen   Discharge Condition: Stable CODE STATUS: DNR Diet recommendation: Heart healthy  Brief/Interim Summary: 75 y.o. female with medical history significant for COPD, sarcoidosis, HTN, HLD, HFpEF, cardiomegaly, chronic respiratory failure with hypoxia on oxygen at 7-8 L/min by  at home who presents for evaluation of worsening SOB. She presented by EMS on NRB and was started on Bipap in the ER.  Symptoms are combination of worsening pulmonary hypertension and COPD exacerbation.  Pulmonary team is following.  [name] was increased by pulmonary to 1600 mcg twice daily.  Over the course of several days patient's oxygen was weaned down from 13 L high flow nasal cannula to 8 L high flow nasal cannula which is around her baseline.  Today she is medically stable to be discharged with recommendations as stated above.  She was initially offered skilled nursing facility but patient declined therefore will be transition home with home health.   Spoke with her daughter over the phone this morning but try to reach back her again regarding her discharge plan but no answer therefore left a voicemail.   Assessment & Plan:   Principal Problem:   Acute on chronic respiratory failure with hypoxia (HCC) Active Problems:   Sarcoidosis stage I   Hyperlipidemia   Essential hypertension   Hypothyroidism    Cardiomegaly   Pulmonary HTN (HCC)   Chronic heart failure with preserved ejection fraction (HFpEF) (HCC)   COPD with acute exacerbation (HCC)   Prolonged QT interval     Acute on chronic respiratory failure with hypoxia, 8 L high flow, improved -Symptoms are combination of worsening pulmonary hypertension, COPD exacerbation and underlying fibrosis.  Supplemental oxygen.  Baseline oxygen 8 L nasal cannula - Pulmonary follow-up outpatient in 2-3 weeks. - Continue bronchodilators-we will go home on daily Trelegy, I-S/flutter - Uptravi increased to 1600 mg twice daily. - Tadalafil - Prednisone 68m x 1 weeks, then 556mx 1 week then off.  - Follow-up outpatient pulmonary.  Will be arranged by their service     COPD, chronic -Continue bronchodilators and prednisone as prescribed     Essential hypertension -Atenolol      Pulmonary HTN -Status post RHC.  Continue tadalafil and Uptarvi    Chronic heart failure with preserved ejection fraction (HFpEF) -No signs of fluid overload at this time.   -Patient does have cardiomegaly on chest x-ray. -2d echo with preserved EF noted, Pt is s/p RHC 6/6 - Lasix 40 mg orally daily at home.  BMP and magnesium levels to be rechecked in 1 week.     Sarcoidosis stage I -On chronic prednisone at home.  Following PCCM.     Hyperlipidemia -Continue Crestor     Hypothyroidism -Daily Synthroid    T12 Compression fracture -Pain controlled.  PT recommended SNF, patient declined.  Will arrange for home health  Body mass index is 27.02 kg/m.         Discharge Diagnoses:  Principal  Problem:   Acute on chronic respiratory failure with hypoxia (HCC) Active Problems:   Sarcoidosis stage I   Hyperlipidemia   Essential hypertension   Hypothyroidism   Cardiomegaly   Pulmonary HTN (HCC)   Chronic heart failure with preserved ejection fraction (HFpEF) (HCC)   COPD with acute exacerbation (HCC)   Prolonged QT  interval      Consultations: Pulmonary  Subjective: Patient feels back to her baseline does not have any new complaints.  Discharge Exam: Vitals:   01/14/21 0853 01/14/21 0945  BP:  (!) 88/60  Pulse:  (!) 59  Resp:  18  Temp:  97.6 F (36.4 C)  SpO2: (!) 89% 92%   Vitals:   01/14/21 0410 01/14/21 0811 01/14/21 0853 01/14/21 0945  BP:    (!) 88/60  Pulse: 60   (!) 59  Resp: 16   18  Temp:    97.6 F (36.4 C)  TempSrc:    Oral  SpO2:   (!) 89% 92%  Weight:  71.4 kg    Height:        General: Pt is alert, awake, not in acute distress, 8 L O2 Cardiovascular: RRR, S1/S2 +, no rubs, no gallops Respiratory: CTA bilaterally, no wheezing, no rhonchi Abdominal: Soft, NT, ND, bowel sounds + Extremities: no edema, no cyanosis  Discharge Instructions  Discharge Instructions     Discharge instructions   Complete by: As directed    You were seen by the pulmonary team. The right heart catheterization suggests that the Uptravi was helping and some symptoms may be related to decreasing the dose. Please increase Uptravi to 1,600 mcg twice a day when you get home. If you need a new prescription please contact Dr. Silas Flood who met with you in the hospital. Continue current dose of tadalafil. We will have you follow up with Dr. Silas Flood at [facility] in the coming weeks.  Contact [facility] at [phone] with any issues.      Allergies as of 01/14/2021       Reactions   Lipitor [atorvastatin] Swelling   Penicillins Swelling   Has patient had a PCN reaction causing immediate rash, facial/tongue/throat swelling, SOB or lightheadedness with hypotension: Yes Has patient had a PCN reaction causing severe rash involving mucus membranes or skin necrosis: No Has patient had a PCN reaction that required hospitalization: No Has patient had a PCN reaction occurring within the last 10 years: No If all of the above answers are "NO", then may proceed with Cephalosporin use.    Metronidazole Swelling   Pregabalin Other (See Comments)   REACTION: Somnolence and dizziness   Simvastatin Other (See Comments)   Leg pain        Medication List     TAKE these medications    acetaminophen 500 MG tablet Commonly known as: TYLENOL Take 1,000 mg by mouth every 6 (six) hours as needed for moderate pain.   aerochamber plus with mask inhaler Use as instructed   albuterol (2.5 MG/3ML) 0.083% nebulizer solution Commonly known as: PROVENTIL Take 2.5 mg by nebulization every 6 (six) hours as needed for wheezing or shortness of breath.   aspirin EC 81 MG tablet Take 81 mg by mouth at bedtime.   Besivance 0.6 % Susp Generic drug: Besifloxacin HCl Place 1 drop into the left eye See admin instructions. Only use : 1 DROP INTO LEFT 4 TIMES DAILY x 2 DAYS AFTER EYE INJECTION   calcium carbonate 1500 (600 Ca) MG Tabs tablet Commonly  known as: OSCAL Take 1,500 mg by mouth daily with breakfast.   CINNAMON PO Take 2,000 mg by mouth daily.   citalopram 10 MG tablet Commonly known as: CELEXA Take 1 tablet (10 mg total) by mouth daily.   Coenzyme Q10 200 MG capsule Take 200 mg by mouth at bedtime.   Fish Oil 1200 MG Caps Take 1,200 mg by mouth daily.   furosemide 40 MG tablet Commonly known as: LASIX Take 1 tablet (40 mg total) by mouth daily. Start taking on: January 15, 2021 What changed:  medication strength how much to take when to take this   gabapentin 300 MG capsule Commonly known as: NEURONTIN Take 1 capsule (300 mg total) by mouth at bedtime.   ketoconazole 2 % shampoo Commonly known as: NIZORAL Apply 1 application topically daily as needed for irritation (scalp).   levothyroxine 112 MCG tablet Commonly known as: SYNTHROID Take 1 tablet (112 mcg total) by mouth daily before breakfast.   potassium chloride 10 MEQ tablet Commonly known as: KLOR-CON Take 10 mEq by mouth every other day.   predniSONE 10 MG tablet Commonly known as:  DELTASONE Take 1 tablet (10 mg total) by mouth daily with breakfast for 7 days, THEN 0.5 tablets (5 mg total) daily with breakfast for 7 days. Start taking on: January 14, 2021 What changed:  medication strength See the new instructions.   Selexipag 1600 MCG Tabs Take 1 tablet (1,600 mcg total) by mouth 2 (two) times daily. What changed:  medication strength how much to take additional instructions Another medication with the same name was removed. Continue taking this medication, and follow the directions you see here.   traMADol 50 MG tablet Commonly known as: Ultram Take 1 tablet (50 mg total) by mouth every 6 (six) hours as needed for moderate pain or severe pain. What changed: how much to take   Trelegy Ellipta 100-62.5-25 MCG/INH Aepb Generic drug: Fluticasone-Umeclidin-Vilant Inhale 1 Inhaler into the lungs daily.   vitamin B-12 1000 MCG tablet Commonly known as: CYANOCOBALAMIN Take 1,000 mcg by mouth daily.   Vitamin D 50 MCG (2000 UT) tablet Take 2,000 Units by mouth every evening.       ASK your doctor about these medications    atenolol 25 MG tablet Commonly known as: TENORMIN TAKE 1 TABLET (25 MG TOTAL) BY MOUTH AT BEDTIME. TAKE THE 50 MG ATENOLOL IN AM AND THE 25 MG IN PM   atenolol 50 MG tablet Commonly known as: TENORMIN TAKE 1 TABLET BY MOUTH EVERY DAY   hydrochlorothiazide 12.5 MG capsule Commonly known as: MICROZIDE TAKE 1 CAPSULE BY MOUTH EVERY DAY   levocetirizine 5 MG tablet Commonly known as: XYZAL TAKE 1 TABLET BY MOUTH EVERY DAY IN THE EVENING   omeprazole 40 MG capsule Commonly known as: PRILOSEC TAKE 1 CAPSULE BY MOUTH EVERY DAY   rosuvastatin 20 MG tablet Commonly known as: CRESTOR TAKE 1 TABLET BY MOUTH EVERY DAY   tadalafil (PAH) 20 MG tablet Commonly known as: ADCIRCA TAKE 2 TABLETS BY MOUTH [employer] Follow up.   Specialty: Home Health  Services Why: Your home health has been set up with [facility]. The office will call you with start of service info upon discharge. For any questions please call number listed above.  Contact information: [address] [place]  [phone]  [facility] Follow up on 01/16/2021.   Specialty: Cardiology Why: at 2:00  Contact information: [address] [id number] [facility]        Mosie Lukes, MD. Call in 1 week(s).   Specialty: Family Medicine Contact information: [address] [place] [address] [phone]                Allergies  Allergen Reactions   Lipitor [Atorvastatin] Swelling   Penicillins Swelling    Has patient had a PCN reaction causing immediate rash, facial/tongue/throat swelling, SOB or lightheadedness with hypotension: Yes Has patient had a PCN reaction causing severe rash involving mucus membranes or skin necrosis: No Has patient had a PCN reaction that required hospitalization: No Has patient had a PCN reaction occurring within the last 10 years: No If all of the above answers are "NO", then may proceed with Cephalosporin use.    Metronidazole Swelling   Pregabalin Other (See Comments)    REACTION: Somnolence and dizziness   Simvastatin Other (See Comments)    Leg pain    You were cared for by a hospitalist during your hospital stay. If you have any questions about your discharge medications or the care you received while you were in the hospital after you are discharged, you can call the unit and asked to speak with the hospitalist on call if the hospitalist that took care of you is not available. Once you are discharged, your primary care physician will handle any further medical issues. Please note that no refills for any discharge medications will be authorized once you are discharged, as it is  imperative that you return to your primary care physician (or establish a relationship with a primary care physician if you do not have one) for your aftercare needs so that they can reassess your need for medications and monitor your lab values.   Procedures/Studies: DG Thoracic Spine 2 View  Result Date: 01/03/2021 CLINICAL DATA:  75 year old female with back pain. EXAM: THORACIC SPINE 2 VIEWS COMPARISON:  CTA chest 0358 hours today. FINDINGS: Normal thoracic segmentation. Mild T3 superior endplate compression with superior endplate sclerosis better demonstrated by CT this morning, and stable since 10/08/2020. Similar T1 inferior endplate compression also better demonstrated by CT and stable. On the lateral images today there is subtle compression of the T12 inferior endplate when compared to radiographs in 2019, and also a chest CT 09/20/2019. This level was not included on the CT earlier today. Other thoracic vertebrae appear intact. Partially visible lumbar spine hardware, abdominal Calcified aortic atherosclerosis. IMPRESSION: 1. Mild T12 inferior endplate compression fracture, new since last year but age indeterminate. If specific therapy such as vertebroplasty is desired, Lumbar MRI or Nuclear Medicine Whole-body Bone Scan would best determine acuity. 2. No other acute osseous abnormality identified in the thoracic spine, with the levels above T12 best demonstrated on the CTA chest earlier today (please see that report) . Electronically Signed   By: Genevie Ann M.D.   On: 01/03/2021 07:38   DG Lumbar Spine 2-3 Views  Result Date: 01/03/2021 CLINICAL DATA:  Back pain. EXAM: LUMBAR SPINE - 2-3 VIEW COMPARISON:  Lumbar spine series 12/06/2014 FINDINGS: Lumbar spine numbered the lowest segmented appearing lumbar shaped vertebrae on lateral view as L5. L2-L3 left lateral fusion. L3 through L5 posterior interbody fusion. Prior vertebroplasty L2. Similar findings noted on prior exam. Hardware intact. Stable  alignment. Stable mild anterolisthesis  L4 on L5. Diffuse osteopenia and degenerative change. No acute bony abnormality. Total right hip replacement. Contrast from prior CT noted the renal collecting systems. Left renal stone cannot be excluded. Aortoiliac atherosclerotic vascular calcification. IMPRESSION: 1. Stable postsurgical changes lumbar spine. Hardware intact. Stable alignment. Stable mild anterolisthesis L4 on L5. Diffuse osteopenia and degenerative change. No acute bony abnormality identified. 2.  Left renal stone cannot be excluded. 3.  Aortoiliac atherosclerotic vascular disease. Electronically Signed   By: Marcello Moores  Register   On: 01/03/2021 07:36   CT ANGIO CHEST PE W OR WO CONTRAST  Result Date: 01/03/2021 CLINICAL DATA:  75 year old female with sarcoidosis. On home oxygen. Shortness of breath. EXAM: CT ANGIOGRAPHY CHEST WITH CONTRAST TECHNIQUE: Multidetector CT imaging of the chest was performed using the standard protocol during bolus administration of intravenous contrast. Multiplanar CT image reconstructions and MIPs were obtained to evaluate the vascular anatomy. CONTRAST:  55m OMNIPAQUE IOHEXOL 350 MG/ML SOLN COMPARISON:  Portable chest 01/02/2021. High-resolution chest CT 10/08/2020. CTA chest 10/17/2019. FINDINGS: Cardiovascular: Excellent contrast bolus timing in the pulmonary arterial tree. Mild respiratory motion at the lung bases. No focal filling defect identified in the pulmonary arteries to suggest acute pulmonary embolism. A degree of chronic central pulmonary artery enlargement is stable since 2021. Tortuous thoracic aorta with calcified atherosclerosis. Tortuous proximal great vessels. Mild to moderate cardiomegaly. No pericardial effusion. Calcified coronary artery atherosclerosis is extensive. Mediastinum/Nodes: Fairly small mediastinal lymph nodes are stable since last year. Lungs/Pleura: Major airways are stable and patent. Lower lung volumes, with increased lung base  atelectasis compared to March this year. Centrilobular emphysema. Stable small right lung nodule on series 7, 2 small right lung nodules remain stable and are benign by criteria. No pleural effusion or other acute pulmonary opacity. Upper Abdomen: Stable and negative visible liver, spleen, stomach, left adrenal gland. Musculoskeletal: Osteopenia. Mild T3 superior endplate compression fracture is new from last year but stable since March. Similar stable since March mild compression of the inferior T1 endplate. No acute osseous abnormality identified. Review of the MIP images confirms the above findings. IMPRESSION: 1. Negative for acute pulmonary embolus. 2. Chronic Emphysema (ICD10-J43.9) with occasional stable and benign pulmonary nodules. Lower lung volumes today with mild atelectasis. 3. Cardiomegaly. Calcified coronary artery and Aortic Atherosclerosis (ICD10-I70.0), tortuosity. 4. Mild T1 and T3 compression fractures are new since 2021 but stable since March. Electronically Signed   By: HGenevie AnnM.D.   On: 01/03/2021 04:21   CARDIAC CATHETERIZATION  Result Date: 01/07/2021 1. Moderate pulmonary arterial hypertension, PVR 4.7 WU. 2. Preserved cardiac output. 3. Normal/low filling pressures. Consider increasing back up on selexipag.   DG Chest Port 1 View  Result Date: 01/02/2021 CLINICAL DATA:  Shortness of breath. EXAM: PORTABLE CHEST 1 VIEW COMPARISON:  January 27, 2018 chest radiograph; chest CT October 08, 2020 FINDINGS: There is chronic interstitial thickening in the lung bases. Lungs otherwise are clear and unchanged. Heart is mildly enlarged with prominence of the main pulmonary arteries, a stable finding likely indicative of a degree of pulmonary arterial hypertension. No evident adenopathy. No bone lesions. IMPRESSION: There is cardiomegaly with apparent pulmonary arterial hypertension. Suspect a degree of underlying chronic bronchitis. No edema or consolidation. Electronically Signed   By: WLowella GripIII M.D.   On: 01/02/2021 15:15   ECHOCARDIOGRAM COMPLETE  Result Date: 01/03/2021    ECHOCARDIOGRAM REPORT   Patient Name:   SSHEMICA MEATHDate of Exam: 01/03/2021 Medical Rec #:  [id number]  Height:       64.0 in Accession #:    [id number]    Weight:       164.0 lb Date of Birth:  Mar 19, 1946      BSA:          1.798 m Patient Age:    26 years      BP:           110/83 mmHg Patient Gender: F             HR:           63 bpm. Exam Location:  Inpatient Procedure: 2D Echo, Cardiac Doppler and Color Doppler Indications:    CHF  History:        Patient has prior history of Echocardiogram examinations, most                 recent 04/28/2019. COPD; Risk Factors:Hypertension, Dyslipidemia                 and Former Smoker.  Sonographer:    Cammy Brochure Referring Phys: [id number] Eben Burow  Sonographer Comments: Image acquisition challenging due to patient body habitus, Image acquisition challenging due to respiratory motion and Image acquisition challenging due to COPD. IMPRESSIONS  1. Left ventricular ejection fraction, by estimation, is 60 to 65%. The left ventricle has normal function. The left ventricle has no regional wall motion abnormalities. There is mild concentric left ventricular hypertrophy. Left ventricular diastolic parameters are consistent with Grade I diastolic dysfunction (impaired relaxation).  2. Right ventricular systolic function is normal. The right ventricular size is moderately enlarged. There is moderately elevated pulmonary artery systolic pressure. The estimated right ventricular systolic pressure is 72.0 mmHg.  3. The mitral valve is normal in structure. No evidence of mitral valve regurgitation. No evidence of mitral stenosis.  4. The aortic valve is tricuspid. Aortic valve regurgitation is not visualized. Mild to moderate aortic valve sclerosis/calcification is present, without any evidence of aortic stenosis. Aortic valve area, by VTI measures 2.25 cm. Aortic valve mean  gradient measures 5.0 mmHg. Aortic valve Vmax measures 1.58 m/s.  5. The inferior vena cava is normal in size with greater than 50% respiratory variability, suggesting right atrial pressure of 3 mmHg. FINDINGS  Left Ventricle: Left ventricular ejection fraction, by estimation, is 60 to 65%. The left ventricle has normal function. The left ventricle has no regional wall motion abnormalities. The left ventricular internal cavity size was normal in size. There is  mild concentric left ventricular hypertrophy. Left ventricular diastolic parameters are consistent with Grade I diastolic dysfunction (impaired relaxation). Normal left ventricular filling pressure. Right Ventricle: The right ventricular size is moderately enlarged. No increase in right ventricular wall thickness. Right ventricular systolic function is normal. There is moderately elevated pulmonary artery systolic pressure. The tricuspid regurgitant  velocity is 3.47 m/s, and with an assumed right atrial pressure of 3 mmHg, the estimated right ventricular systolic pressure is 94.7 mmHg. Left Atrium: Left atrial size was normal in size. Right Atrium: Right atrial size was normal in size. Pericardium: There is no evidence of pericardial effusion. Mitral Valve: The mitral valve is normal in structure. No evidence of mitral valve regurgitation. No evidence of mitral valve stenosis. Tricuspid Valve: The tricuspid valve is normal in structure. Tricuspid valve regurgitation is mild . No evidence of tricuspid stenosis. Aortic Valve: The aortic valve is tricuspid. Aortic valve regurgitation is not visualized. Mild to moderate aortic valve sclerosis/calcification is present, without any  evidence of aortic stenosis. Aortic valve mean gradient measures 5.0 mmHg. Aortic valve peak gradient measures 10.0 mmHg. Aortic valve area, by VTI measures 2.25 cm. Pulmonic Valve: The pulmonic valve was normal in structure. Pulmonic valve regurgitation is not visualized. No evidence  of pulmonic stenosis. Aorta: The aortic root is normal in size and structure. Venous: The inferior vena cava is normal in size with greater than 50% respiratory variability, suggesting right atrial pressure of 3 mmHg. IAS/Shunts: No atrial level shunt detected by color flow Doppler.  LEFT VENTRICLE PLAX 2D LVIDd:         4.90 cm  Diastology LVIDs:         3.50 cm  LV e' medial:   4.68 cm/s LV PW:         1.10 cm  LV E/e' medial: 9.1 LV IVS:        1.20 cm LVOT diam:     2.00 cm LV SV:         67 LV SV Index:   37 LVOT Area:     3.14 cm  RIGHT VENTRICLE             IVC RV Basal diam:  4.70 cm     IVC diam: 1.70 cm RV S prime:     11.20 cm/s LEFT ATRIUM             Index       RIGHT ATRIUM           Index LA diam:        4.20 cm 2.34 cm/m  RA Area:     18.30 cm LA Vol (A2C):   39.6 ml 22.02 ml/m RA Volume:   44.90 ml  24.97 ml/m LA Vol (A4C):   42.5 ml 23.63 ml/m LA Biplane Vol: 41.6 ml 23.13 ml/m  AORTIC VALVE AV Area (Vmax):    2.05 cm AV Area (Vmean):   2.00 cm AV Area (VTI):     2.25 cm AV Vmax:           158.00 cm/s AV Vmean:          104.000 cm/s AV VTI:            0.298 m AV Peak Grad:      10.0 mmHg AV Mean Grad:      5.0 mmHg LVOT Vmax:         103.00 cm/s LVOT Vmean:        66.100 cm/s LVOT VTI:          0.213 m LVOT/AV VTI ratio: 0.71  AORTA Ao Root diam: 3.30 cm Ao Asc diam:  3.60 cm MITRAL VALVE               TRICUSPID VALVE MV Area (PHT): 2.73 cm    TR Peak grad:   48.2 mmHg MV Decel Time: 278 msec    TR Vmax:        347.00 cm/s MV E velocity: 42.80 cm/s MV A velocity: 97.30 cm/s  SHUNTS MV E/A ratio:  0.44        Systemic VTI:  0.21 m                            Systemic Diam: 2.00 cm Fransico Him MD Electronically signed by Fransico Him MD Signature Date/Time: 01/03/2021/4:48:06 PM    Final      The results of significant diagnostics from this hospitalization (  including imaging, microbiology, ancillary and laboratory) are listed below for reference.     Microbiology: No results found for  this or any previous visit (from the past 240 hour(s)).   Labs: BNP (last 3 results) Recent Labs    01/02/21 1446 01/04/21 1634 01/05/21 0158  BNP 452.8* 448.0* 301.6*   Basic Metabolic Panel: Recent Labs  Lab 01/10/21 1020 01/11/21 0926 01/12/21 0132 01/13/21 0313 01/14/21 0513  NA 135 134* 135 136 136  K 3.6 3.8 4.0 3.7 3.4*  CL 95* 96* 98 97* 100  CO2 _0 GLUCOSE 218* 161* 106* 85 93  BUN 48* 50* 46* 41* 31*  CREATININE 1.29* 1.40* 1.16* 1.20* 1.19*  CALCIUM 9.1 9.0 9.1 9.1 8.7*  MG 2.2 2.2 2.2 2.1 2.1   Liver Function Tests: Recent Labs  Lab 01/08/21 0359 01/09/21 0431  AST 33 30  ALT 47* 43  ALKPHOS 43 44  BILITOT 1.0 0.9  PROT 6.7 6.3*  ALBUMIN 3.5 3.4*   No results for input(s): LIPASE, AMYLASE in the last 168 hours. No results for input(s): AMMONIA in the last 168 hours. CBC: Recent Labs  Lab 01/07/21 1327 01/12/21 0132 01/13/21 0313 01/14/21 0513  WBC  --  15.9* 13.1* 11.1*  HGB 15.3*  15.3* 14.4 13.3 12.6  HCT 45.0  45.0 41.6 39.0 36.7  MCV  --  88.3 87.8 88.6  PLT  --  175 167 161   Cardiac Enzymes: No results for input(s): CKTOTAL, CKMB, CKMBINDEX, TROPONINI in the last 168 hours. BNP: Invalid input(s): POCBNP CBG: No results for input(s): GLUCAP in the last 168 hours. D-Dimer No results for input(s): DDIMER in the last 72 hours. Hgb A1c No results for input(s): HGBA1C in the last 72 hours. Lipid Profile No results for input(s): CHOL, HDL, LDLCALC, TRIG, CHOLHDL, LDLDIRECT in the last 72 hours. Thyroid function studies No results for input(s): TSH, T4TOTAL, T3FREE, THYROIDAB in the last 72 hours.  Invalid input(s): FREET3 Anemia work up No results for input(s): VITAMINB12, FOLATE, FERRITIN, TIBC, IRON, RETICCTPCT in the last 72 hours. Urinalysis    Component Value Date/Time   COLORURINE YELLOW 01/02/2021 2027   APPEARANCEUR CLEAR 01/02/2021 2027   LABSPEC 1.014 01/02/2021 2027   PHURINE 5.0 01/02/2021 2027    GLUCOSEU NEGATIVE 01/02/2021 2027   GLUCOSEU NEGATIVE 08/28/2014 1122   HGBUR SMALL (A) 01/02/2021 2027   HGBUR negative 06/01/2008 1356   BILIRUBINUR NEGATIVE 01/02/2021 2027   KETONESUR NEGATIVE 01/02/2021 2027   PROTEINUR NEGATIVE 01/02/2021 2027   UROBILINOGEN 0.2 08/28/2014 1122   NITRITE NEGATIVE 01/02/2021 2027   LEUKOCYTESUR NEGATIVE 01/02/2021 2027   Sepsis Labs Invalid input(s): PROCALCITONIN,  WBC,  LACTICIDVEN Microbiology No results found for this or any previous visit (from the past 240 hour(s)).   Time coordinating discharge:  I have spent 35 minutes face to face with the patient and on the ward discussing the patients care, assessment, plan and disposition with other care givers. >50% of the time was devoted counseling the patient about the risks and benefits of treatment/Discharge disposition and coordinating care.   SIGNED:   Damita Lack, MD  [facility] 01/14/2021, 10:09 AM   If 7PM-7AM, please contact night-coverage

## 2021-01-15 ENCOUNTER — Telehealth: Payer: Self-pay

## 2021-01-15 NOTE — Telephone Encounter (Signed)
Transition Care Management Unsuccessful Follow-up Telephone Call  Date of discharge and from where:  01/14/2021-[facility]  Attempts:  1st Attempt  Reason for unsuccessful TCM follow-up call:  No answer/busy

## 2021-01-16 ENCOUNTER — Telehealth: Payer: Self-pay

## 2021-01-16 ENCOUNTER — Encounter (HOSPITAL_COMMUNITY): Payer: Medicare Other

## 2021-01-16 NOTE — Telephone Encounter (Signed)
Transition Care Management Follow-up Telephone Call Date of discharge and from where: 01/14/2021-[facility] How have you been since you were released from the hospital? Doing ok but still really tired Any questions or concerns? No  Items Reviewed: Did the pt receive and understand the discharge instructions provided? Yes  Medications obtained and verified? Yes  Other? Yes  Any new allergies since your discharge? No  Dietary orders reviewed? Yes Do you have support at home? Yes   Home Care and Equipment/Supplies: Were home health services ordered? yes If so, what is the name of the agency? [facility]  Has the agency set up a time to come to the patient's home? yes Were any new equipment or medical supplies ordered?  No What is the name of the medical supply agency? N/a Were you able to get the supplies/equipment? not applicable   Functional Questionnaire: (I = Independent and D = Dependent) ADLs: I with assistance  Bathing/Dressing- I with assistance  Meal Prep- D  Eating- I  Maintaining continence- I with assistance   Transferring/Ambulation- I with walker & assistance  Managing Meds- I   Follow up appointments reviewed:  PCP Hospital f/u appt confirmed? Yes  Scheduled to see Mackie Pai on 01/23/21 @ 10:40. [facility] f/u appt confirmed? Yes  Scheduled to see Dr. Silas Flood on 01/24/21 @ 12:00. Are transportation arrangements needed? No  If their condition worsens, is the pt aware to call PCP or go to the Emergency Dept.? Yes Was the patient provided with contact information for the PCP's office or ED? Yes Was to pt encouraged to call back with questions or concerns? Yes

## 2021-01-17 DIAGNOSIS — J9611 Chronic respiratory failure with hypoxia: Secondary | ICD-10-CM | POA: Diagnosis not present

## 2021-01-17 DIAGNOSIS — I5032 Chronic diastolic (congestive) heart failure: Secondary | ICD-10-CM | POA: Diagnosis not present

## 2021-01-17 DIAGNOSIS — Z96641 Presence of right artificial hip joint: Secondary | ICD-10-CM | POA: Diagnosis not present

## 2021-01-17 DIAGNOSIS — M545 Low back pain, unspecified: Secondary | ICD-10-CM | POA: Diagnosis not present

## 2021-01-17 DIAGNOSIS — I5033 Acute on chronic diastolic (congestive) heart failure: Secondary | ICD-10-CM | POA: Diagnosis not present

## 2021-01-17 DIAGNOSIS — E039 Hypothyroidism, unspecified: Secondary | ICD-10-CM | POA: Diagnosis not present

## 2021-01-17 DIAGNOSIS — Z87891 Personal history of nicotine dependence: Secondary | ICD-10-CM | POA: Diagnosis not present

## 2021-01-17 DIAGNOSIS — E785 Hyperlipidemia, unspecified: Secondary | ICD-10-CM | POA: Diagnosis not present

## 2021-01-17 DIAGNOSIS — G8929 Other chronic pain: Secondary | ICD-10-CM | POA: Diagnosis not present

## 2021-01-17 DIAGNOSIS — I110 Hypertensive heart disease with heart failure: Secondary | ICD-10-CM | POA: Diagnosis not present

## 2021-01-17 DIAGNOSIS — I272 Pulmonary hypertension, unspecified: Secondary | ICD-10-CM | POA: Diagnosis not present

## 2021-01-17 DIAGNOSIS — I2510 Atherosclerotic heart disease of native coronary artery without angina pectoris: Secondary | ICD-10-CM | POA: Diagnosis not present

## 2021-01-17 DIAGNOSIS — D860 Sarcoidosis of lung: Secondary | ICD-10-CM | POA: Diagnosis not present

## 2021-01-17 DIAGNOSIS — Z7952 Long term (current) use of systemic steroids: Secondary | ICD-10-CM | POA: Diagnosis not present

## 2021-01-17 DIAGNOSIS — J441 Chronic obstructive pulmonary disease with (acute) exacerbation: Secondary | ICD-10-CM | POA: Diagnosis not present

## 2021-01-17 DIAGNOSIS — Z9181 History of falling: Secondary | ICD-10-CM | POA: Diagnosis not present

## 2021-01-21 DIAGNOSIS — D860 Sarcoidosis of lung: Secondary | ICD-10-CM | POA: Diagnosis not present

## 2021-01-21 DIAGNOSIS — J441 Chronic obstructive pulmonary disease with (acute) exacerbation: Secondary | ICD-10-CM | POA: Diagnosis not present

## 2021-01-21 DIAGNOSIS — I2510 Atherosclerotic heart disease of native coronary artery without angina pectoris: Secondary | ICD-10-CM | POA: Diagnosis not present

## 2021-01-21 DIAGNOSIS — I5032 Chronic diastolic (congestive) heart failure: Secondary | ICD-10-CM | POA: Diagnosis not present

## 2021-01-21 DIAGNOSIS — J9611 Chronic respiratory failure with hypoxia: Secondary | ICD-10-CM | POA: Diagnosis not present

## 2021-01-21 DIAGNOSIS — I110 Hypertensive heart disease with heart failure: Secondary | ICD-10-CM | POA: Diagnosis not present

## 2021-01-23 ENCOUNTER — Inpatient Hospital Stay: Payer: Medicare Other | Admitting: Medical

## 2021-01-23 DIAGNOSIS — I110 Hypertensive heart disease with heart failure: Secondary | ICD-10-CM | POA: Diagnosis not present

## 2021-01-23 DIAGNOSIS — I5032 Chronic diastolic (congestive) heart failure: Secondary | ICD-10-CM | POA: Diagnosis not present

## 2021-01-23 DIAGNOSIS — J9611 Chronic respiratory failure with hypoxia: Secondary | ICD-10-CM | POA: Diagnosis not present

## 2021-01-23 DIAGNOSIS — D860 Sarcoidosis of lung: Secondary | ICD-10-CM | POA: Diagnosis not present

## 2021-01-23 DIAGNOSIS — I2510 Atherosclerotic heart disease of native coronary artery without angina pectoris: Secondary | ICD-10-CM | POA: Diagnosis not present

## 2021-01-23 DIAGNOSIS — J441 Chronic obstructive pulmonary disease with (acute) exacerbation: Secondary | ICD-10-CM | POA: Diagnosis not present

## 2021-01-24 ENCOUNTER — Other Ambulatory Visit: Payer: Self-pay

## 2021-01-24 ENCOUNTER — Encounter: Payer: Self-pay | Admitting: Medical

## 2021-01-24 ENCOUNTER — Ambulatory Visit (INDEPENDENT_AMBULATORY_CARE_PROVIDER_SITE_OTHER): Payer: Medicare Other | Admitting: Medical

## 2021-01-24 ENCOUNTER — Encounter: Payer: Self-pay | Admitting: Pulmonary Disease

## 2021-01-24 ENCOUNTER — Ambulatory Visit (INDEPENDENT_AMBULATORY_CARE_PROVIDER_SITE_OTHER): Payer: Medicare Other | Admitting: Pulmonary Disease

## 2021-01-24 VITALS — BP 110/68 | HR 58 | Resp 18 | Ht 64.0 in | Wt 153.0 lb

## 2021-01-24 VITALS — BP 114/76 | HR 52 | Ht 64.0 in | Wt 150.0 lb

## 2021-01-24 DIAGNOSIS — D869 Sarcoidosis, unspecified: Secondary | ICD-10-CM

## 2021-01-24 DIAGNOSIS — R790 Abnormal level of blood mineral: Secondary | ICD-10-CM | POA: Diagnosis not present

## 2021-01-24 DIAGNOSIS — I10 Essential (primary) hypertension: Secondary | ICD-10-CM

## 2021-01-24 DIAGNOSIS — J9611 Chronic respiratory failure with hypoxia: Secondary | ICD-10-CM | POA: Diagnosis not present

## 2021-01-24 DIAGNOSIS — J439 Emphysema, unspecified: Secondary | ICD-10-CM | POA: Diagnosis not present

## 2021-01-24 LAB — COMPREHENSIVE METABOLIC PANEL
ALT: 17 U/L (ref 0–35)
AST: 18 U/L (ref 0–37)
Albumin: 4.2 g/dL (ref 3.5–5.2)
Alkaline Phosphatase: 61 U/L (ref 39–117)
BUN: 29 mg/dL — ABNORMAL HIGH (ref 6–23)
CO2: 31 mEq/L (ref 19–32)
Calcium: 9.7 mg/dL (ref 8.4–10.5)
Chloride: 98 mEq/L (ref 96–112)
Creatinine, Ser: 1.23 mg/dL — ABNORMAL HIGH (ref 0.40–1.20)
GFR: 42.6 mL/min — ABNORMAL LOW (ref >60.00)
Glucose, Bld: 126 mg/dL — ABNORMAL HIGH (ref 70–99)
Potassium: 3.8 mEq/L (ref 3.5–5.1)
Sodium: 141 mEq/L (ref 135–145)
Total Bilirubin: 0.9 mg/dL (ref 0.2–1.2)
Total Protein: 6.6 g/dL (ref 6.0–8.3)

## 2021-01-24 LAB — MAGNESIUM: Magnesium: 1.9 mg/dL (ref 1.5–2.5)

## 2021-01-24 NOTE — Progress Notes (Signed)
_0  ID: [name], female    DOB: 14-Mar-1946, 75 y.o.   MRN: [id number]  No chief complaint on file.   Referring provider: Mosie Lukes, MD  HPI:   75 year old with history of hypoxemic respiratory failure, burned-out sarcoid, emphysema, pulmonary hypertension whom we are seeing in hospital follow-up.  H&P and discharge summary from recent hospitalization reviewed.  Patient with renal decompensation last several months.  More acutely over the last month to weeks.  This in the setting of decreasing her Uptravi dose.  She had fluid gain, weight gain.  Had right heart cath that showed worsened hemodynamics and volume status.  She was aggressive diuresed.  Her hypoxemia improved to baseline.  Given changes, recommended she increase her Uptravi.  She went from 1400 mcg twice daily to 1600 mcg twice daily.  Doing okay.  Still short of breath.  Noted to be hypoxemic on 5 L pulsed today.  Discussed need for better tanks so she can adequately get oxygen required.  Stressed the importance of avoiding hypoxemia in the treatment of pulmonary hypertension to avoid hypoxemic vasoconstriction.  Questionaires / Pulmonary Flowsheets:   ACT:  No flowsheet data found.  MMRC: mMRC Dyspnea Scale mMRC Score  10/13/2019 3    Epworth:  No flowsheet data found.  Tests:   FENO:  Lab Results  Component Value Date   NITRICOXIDE 20 05/16/2016    PFT: PFT Results Latest Ref Rng & Units 11/30/2020 10/19/2017 05/16/2016  FVC-Pre L 1.99 2.40 2.28  FVC-Predicted Pre % 71 83 77  FVC-Post L 2.04 2.43 2.33  FVC-Predicted Post % 73 84 79  Pre FEV1/FVC % % 76 71 70  Post FEV1/FCV % % 74 75 76  FEV1-Pre L 1.51 1.70 1.59  FEV1-Predicted Pre % 72 78 72  FEV1-Post L 1.52 1.83 1.76  DLCO uncorrected ml/min/mmHg 4.90 6.15 8.63  DLCO UNC% % 25 25 35  DLCO corrected ml/min/mmHg 4.90 6.01 8.34  DLCO COR %Predicted % 25 24 34  DLVA Predicted % 34 34 43  TLC L 3.56 3.83 3.77  TLC % Predicted % 70 75 74   RV % Predicted % 49 60 57    WALK:  SIX MIN WALK 10/19/2017 09/10/2017 05/16/2016  Supplimental Oxygen during Test? (L/min) Yes Yes No  O2 Flow Rate 1 2 -  Type Pulse Pulse -  Tech Comments: pt was 88% 1lpm pulsed at rest- placed on Simply Mini Go- 4lpm pulsed o2 and sats increased to 94%- walked approx 50 ft and sats dropped to 88%--increased to 5lpm pulsed o2 and walked another 100 ft and sats dropped to 79%//lmr pt walked slow pace, 1/2 lap pt dropped 84% on 5 liters pulse, by 2 lap pt walked slow pace at 3 liters con't. maintained 91%.kmw Placed on 2L O2 - recovery to 91% on 2L    Imaging: Personally reviewed as per EMR discussion this note DG Thoracic Spine 2 View  Result Date: 01/03/2021 CLINICAL DATA:  75 year old female with back pain. EXAM: THORACIC SPINE 2 VIEWS COMPARISON:  CTA chest 0358 hours today. FINDINGS: Normal thoracic segmentation. Mild T3 superior endplate compression with superior endplate sclerosis better demonstrated by CT this morning, and stable since 10/08/2020. Similar T1 inferior endplate compression also better demonstrated by CT and stable. On the lateral images today there is subtle compression of the T12 inferior endplate when compared to radiographs in 2019, and also a chest CT 09/20/2019. This level was not included on the CT earlier today. Other  thoracic vertebrae appear intact. Partially visible lumbar spine hardware, abdominal Calcified aortic atherosclerosis. IMPRESSION: 1. Mild T12 inferior endplate compression fracture, new since last year but age indeterminate. If specific therapy such as vertebroplasty is desired, Lumbar MRI or Nuclear Medicine Whole-body Bone Scan would best determine acuity. 2. No other acute osseous abnormality identified in the thoracic spine, with the levels above T12 best demonstrated on the CTA chest earlier today (please see that report) . Electronically Signed   By: Genevie Ann M.D.   On: 01/03/2021 07:38   DG Lumbar Spine 2-3  Views  Result Date: 01/03/2021 CLINICAL DATA:  Back pain. EXAM: LUMBAR SPINE - 2-3 VIEW COMPARISON:  Lumbar spine series 12/06/2014 FINDINGS: Lumbar spine numbered the lowest segmented appearing lumbar shaped vertebrae on lateral view as L5. L2-L3 left lateral fusion. L3 through L5 posterior interbody fusion. Prior vertebroplasty L2. Similar findings noted on prior exam. Hardware intact. Stable alignment. Stable mild anterolisthesis L4 on L5. Diffuse osteopenia and degenerative change. No acute bony abnormality. Total right hip replacement. Contrast from prior CT noted the renal collecting systems. Left renal stone cannot be excluded. Aortoiliac atherosclerotic vascular calcification. IMPRESSION: 1. Stable postsurgical changes lumbar spine. Hardware intact. Stable alignment. Stable mild anterolisthesis L4 on L5. Diffuse osteopenia and degenerative change. No acute bony abnormality identified. 2.  Left renal stone cannot be excluded. 3.  Aortoiliac atherosclerotic vascular disease. Electronically Signed   By: Marcello Moores  Register   On: 01/03/2021 07:36   CT ANGIO CHEST PE W OR WO CONTRAST  Result Date: 01/03/2021 CLINICAL DATA:  75 year old female with sarcoidosis. On home oxygen. Shortness of breath. EXAM: CT ANGIOGRAPHY CHEST WITH CONTRAST TECHNIQUE: Multidetector CT imaging of the chest was performed using the standard protocol during bolus administration of intravenous contrast. Multiplanar CT image reconstructions and MIPs were obtained to evaluate the vascular anatomy. CONTRAST:  68m OMNIPAQUE IOHEXOL 350 MG/ML SOLN COMPARISON:  Portable chest 01/02/2021. High-resolution chest CT 10/08/2020. CTA chest 10/17/2019. FINDINGS: Cardiovascular: Excellent contrast bolus timing in the pulmonary arterial tree. Mild respiratory motion at the lung bases. No focal filling defect identified in the pulmonary arteries to suggest acute pulmonary embolism. A degree of chronic central pulmonary artery enlargement is stable  since 2021. Tortuous thoracic aorta with calcified atherosclerosis. Tortuous proximal great vessels. Mild to moderate cardiomegaly. No pericardial effusion. Calcified coronary artery atherosclerosis is extensive. Mediastinum/Nodes: Fairly small mediastinal lymph nodes are stable since last year. Lungs/Pleura: Major airways are stable and patent. Lower lung volumes, with increased lung base atelectasis compared to March this year. Centrilobular emphysema. Stable small right lung nodule on series 7, 2 small right lung nodules remain stable and are benign by criteria. No pleural effusion or other acute pulmonary opacity. Upper Abdomen: Stable and negative visible liver, spleen, stomach, left adrenal gland. Musculoskeletal: Osteopenia. Mild T3 superior endplate compression fracture is new from last year but stable since March. Similar stable since March mild compression of the inferior T1 endplate. No acute osseous abnormality identified. Review of the MIP images confirms the above findings. IMPRESSION: 1. Negative for acute pulmonary embolus. 2. Chronic Emphysema (ICD10-J43.9) with occasional stable and benign pulmonary nodules. Lower lung volumes today with mild atelectasis. 3. Cardiomegaly. Calcified coronary artery and Aortic Atherosclerosis (ICD10-I70.0), tortuosity. 4. Mild T1 and T3 compression fractures are new since 2021 but stable since March. Electronically Signed   By: HGenevie AnnM.D.   On: 01/03/2021 04:21   CARDIAC CATHETERIZATION  Result Date: 01/07/2021 1. Moderate pulmonary arterial hypertension, PVR 4.7 WU. 2.  Preserved cardiac output. 3. Normal/low filling pressures. Consider increasing back up on selexipag.   DG Chest Port 1 View  Result Date: 01/02/2021 CLINICAL DATA:  Shortness of breath. EXAM: PORTABLE CHEST 1 VIEW COMPARISON:  January 27, 2018 chest radiograph; chest CT October 08, 2020 FINDINGS: There is chronic interstitial thickening in the lung bases. Lungs otherwise are clear and unchanged.  Heart is mildly enlarged with prominence of the main pulmonary arteries, a stable finding likely indicative of a degree of pulmonary arterial hypertension. No evident adenopathy. No bone lesions. IMPRESSION: There is cardiomegaly with apparent pulmonary arterial hypertension. Suspect a degree of underlying chronic bronchitis. No edema or consolidation. Electronically Signed   By: Lowella Grip III M.D.   On: 01/02/2021 15:15   ECHOCARDIOGRAM COMPLETE  Result Date: 01/03/2021    ECHOCARDIOGRAM REPORT   Patient Name:   Kimberly Irwin Date of Exam: 01/03/2021 Medical Rec #:  [id number]     Height:       64.0 in Accession #:    [id number]    Weight:       164.0 lb Date of Birth:  04-05-1946      BSA:          1.798 m Patient Age:    40 years      BP:           110/83 mmHg Patient Gender: F             HR:           63 bpm. Exam Location:  Inpatient Procedure: 2D Echo, Cardiac Doppler and Color Doppler Indications:    CHF  History:        Patient has prior history of Echocardiogram examinations, most                 recent 04/28/2019. COPD; Risk Factors:Hypertension, Dyslipidemia                 and Former Smoker.  Sonographer:    Cammy Brochure Referring Phys: [id number] Eben Burow  Sonographer Comments: Image acquisition challenging due to patient body habitus, Image acquisition challenging due to respiratory motion and Image acquisition challenging due to COPD. IMPRESSIONS  1. Left ventricular ejection fraction, by estimation, is 60 to 65%. The left ventricle has normal function. The left ventricle has no regional wall motion abnormalities. There is mild concentric left ventricular hypertrophy. Left ventricular diastolic parameters are consistent with Grade I diastolic dysfunction (impaired relaxation).  2. Right ventricular systolic function is normal. The right ventricular size is moderately enlarged. There is moderately elevated pulmonary artery systolic pressure. The estimated right ventricular systolic  pressure is 86.7 mmHg.  3. The mitral valve is normal in structure. No evidence of mitral valve regurgitation. No evidence of mitral stenosis.  4. The aortic valve is tricuspid. Aortic valve regurgitation is not visualized. Mild to moderate aortic valve sclerosis/calcification is present, without any evidence of aortic stenosis. Aortic valve area, by VTI measures 2.25 cm. Aortic valve mean gradient measures 5.0 mmHg. Aortic valve Vmax measures 1.58 m/s.  5. The inferior vena cava is normal in size with greater than 50% respiratory variability, suggesting right atrial pressure of 3 mmHg. FINDINGS  Left Ventricle: Left ventricular ejection fraction, by estimation, is 60 to 65%. The left ventricle has normal function. The left ventricle has no regional wall motion abnormalities. The left ventricular internal cavity size was normal in size. There is  mild concentric left ventricular hypertrophy. Left ventricular  diastolic parameters are consistent with Grade I diastolic dysfunction (impaired relaxation). Normal left ventricular filling pressure. Right Ventricle: The right ventricular size is moderately enlarged. No increase in right ventricular wall thickness. Right ventricular systolic function is normal. There is moderately elevated pulmonary artery systolic pressure. The tricuspid regurgitant  velocity is 3.47 m/s, and with an assumed right atrial pressure of 3 mmHg, the estimated right ventricular systolic pressure is 59.4 mmHg. Left Atrium: Left atrial size was normal in size. Right Atrium: Right atrial size was normal in size. Pericardium: There is no evidence of pericardial effusion. Mitral Valve: The mitral valve is normal in structure. No evidence of mitral valve regurgitation. No evidence of mitral valve stenosis. Tricuspid Valve: The tricuspid valve is normal in structure. Tricuspid valve regurgitation is mild . No evidence of tricuspid stenosis. Aortic Valve: The aortic valve is tricuspid. Aortic valve  regurgitation is not visualized. Mild to moderate aortic valve sclerosis/calcification is present, without any evidence of aortic stenosis. Aortic valve mean gradient measures 5.0 mmHg. Aortic valve peak gradient measures 10.0 mmHg. Aortic valve area, by VTI measures 2.25 cm. Pulmonic Valve: The pulmonic valve was normal in structure. Pulmonic valve regurgitation is not visualized. No evidence of pulmonic stenosis. Aorta: The aortic root is normal in size and structure. Venous: The inferior vena cava is normal in size with greater than 50% respiratory variability, suggesting right atrial pressure of 3 mmHg. IAS/Shunts: No atrial level shunt detected by color flow Doppler.  LEFT VENTRICLE PLAX 2D LVIDd:         4.90 cm  Diastology LVIDs:         3.50 cm  LV e' medial:   4.68 cm/s LV PW:         1.10 cm  LV E/e' medial: 9.1 LV IVS:        1.20 cm LVOT diam:     2.00 cm LV SV:         67 LV SV Index:   37 LVOT Area:     3.14 cm  RIGHT VENTRICLE             IVC RV Basal diam:  4.70 cm     IVC diam: 1.70 cm RV S prime:     11.20 cm/s LEFT ATRIUM             Index       RIGHT ATRIUM           Index LA diam:        4.20 cm 2.34 cm/m  RA Area:     18.30 cm LA Vol (A2C):   39.6 ml 22.02 ml/m RA Volume:   44.90 ml  24.97 ml/m LA Vol (A4C):   42.5 ml 23.63 ml/m LA Biplane Vol: 41.6 ml 23.13 ml/m  AORTIC VALVE AV Area (Vmax):    2.05 cm AV Area (Vmean):   2.00 cm AV Area (VTI):     2.25 cm AV Vmax:           158.00 cm/s AV Vmean:          104.000 cm/s AV VTI:            0.298 m AV Peak Grad:      10.0 mmHg AV Mean Grad:      5.0 mmHg LVOT Vmax:         103.00 cm/s LVOT Vmean:        66.100 cm/s LVOT VTI:  0.213 m LVOT/AV VTI ratio: 0.71  AORTA Ao Root diam: 3.30 cm Ao Asc diam:  3.60 cm MITRAL VALVE               TRICUSPID VALVE MV Area (PHT): 2.73 cm    TR Peak grad:   48.2 mmHg MV Decel Time: 278 msec    TR Vmax:        347.00 cm/s MV E velocity: 42.80 cm/s MV A velocity: 97.30 cm/s  SHUNTS MV E/A ratio:   0.44        Systemic VTI:  0.21 m                            Systemic Diam: 2.00 cm Fransico Him MD Electronically signed by Fransico Him MD Signature Date/Time: 01/03/2021/4:48:06 PM    Final     Lab Results: Personally reviewed, no anemia CBC    Component Value Date/Time   WBC 11.1 (H) 01/14/2021 0513   RBC 4.14 01/14/2021 0513   HGB 12.6 01/14/2021 0513   HGB 13.4 10/21/2018 1631   HCT 36.7 01/14/2021 0513   HCT 37.6 10/21/2018 1631   PLT 161 01/14/2021 0513   PLT 182 10/21/2018 1631   MCV 88.6 01/14/2021 0513   MCV 92 10/21/2018 1631   MCH 30.4 01/14/2021 0513   MCHC 34.3 01/14/2021 0513   RDW 15.5 01/14/2021 0513   RDW 14.1 10/21/2018 1631   LYMPHSABS 1.0 01/02/2021 1453   LYMPHSABS 1.5 10/21/2018 1631   MONOABS 1.0 01/02/2021 1453   EOSABS 0.0 01/02/2021 1453   EOSABS 0.1 10/21/2018 1631   BASOSABS 0.1 01/02/2021 1453   BASOSABS 0.1 10/21/2018 1631    BMET    Component Value Date/Time   NA 136 01/14/2021 0513   NA 143 09/07/2018 1710   K 3.4 (L) 01/14/2021 0513   CL 100 01/14/2021 0513   CO2 27 01/14/2021 0513   GLUCOSE 93 01/14/2021 0513   BUN 31 (H) 01/14/2021 0513   BUN 17 09/07/2018 1710   CREATININE 1.19 (H) 01/14/2021 0513   CREATININE 1.20 (H) 11/30/2020 1523   CALCIUM 8.7 (L) 01/14/2021 0513   GFRNONAA 47 (L) 01/14/2021 0513   GFRNONAA 51 (L) 04/22/2019 1618   GFRAA 59 (L) 05/10/2019 1621   GFRAA 59 (L) 04/22/2019 1618    BNP    Component Value Date/Time   BNP 177.2 (H) 01/14/2021 0513   BNP 137 (H) 04/22/2019 1618    ProBNP    Component Value Date/Time   PROBNP 151.0 (H) 10/13/2019 0922    Specialty Problems       Pulmonary Problems   Sinusitis    Qualifier: Diagnosis of  By: Wynona Luna        SOB (shortness of breath)   Chronic respiratory failure with hypoxia (HCC)   COPD (chronic obstructive pulmonary disease) (HCC)    PFT (05/2016) FEV1  1.59  72%,  DLCO 35%       Hypoxia, sleep related   Epistaxis   Acute on  chronic respiratory failure with hypoxia (HCC)   COPD with acute exacerbation (HCC)    Allergies  Allergen Reactions   Lipitor [Atorvastatin] Swelling   Penicillins Swelling    Has patient had a PCN reaction causing immediate rash, facial/tongue/throat swelling, SOB or lightheadedness with hypotension: Yes Has patient had a PCN reaction causing severe rash involving mucus membranes or skin necrosis: No Has patient had a PCN reaction that  required hospitalization: No Has patient had a PCN reaction occurring within the last 10 years: No If all of the above answers are "NO", then may proceed with Cephalosporin use.    Metronidazole Swelling   Pregabalin Other (See Comments)    REACTION: Somnolence and dizziness   Simvastatin Other (See Comments)    Leg pain    Immunization History  Administered Date(s) Administered   Fluad Quad(high Dose 65+) 04/22/2019   Influenza Split 05/08/2011, 05/26/2012   Influenza Whole 06/03/2006, 05/31/2007, 04/26/2008, 05/23/2009, 05/20/2010   Influenza, High Dose Seasonal PF 05/23/2013, 04/03/2016, 06/03/2017, 04/12/2018   Influenza,inj,Quad PF,6+ Mos 04/27/2014, 06/15/2015   Influenza-Unspecified 04/22/2019   Moderna Sars-Covid-2 Vaccination 08/29/2019, 09/26/2019   Pneumococcal Conjugate-13 04/27/2014   Pneumococcal Polysaccharide-23 06/18/2011, 07/03/2016   Tdap 06/18/2011    Past Medical History:  Diagnosis Date   Bursitis of left hip 10/26/2012   Chronic low back pain    Chronic respiratory failure with hypoxia ([place]) 10/21/2016   Coronary artery calcification 07/03/2016   DDD (degenerative disc disease)    L3-4 with facet arthropathy and stenosis   Degenerative spondylolisthesis    L4-5 grade 1 with stenosis   Emphysema lung (HCC)    GERD (gastroesophageal reflux disease)    Hiatal hernia    Hyperlipidemia    Hypertension    Hypothyroidism 10/28/2015   Pain in joint, lower leg 10/26/2012   left    Pneumonia    as a child several times.     Pulmonary hypertension ([place])    Sarcoidosis    Tremors of nervous system     Tobacco History: Social History   Tobacco Use  Smoking Status Former   Packs/day: 0.50   Years: 32.00   Pack years: 16.00   Types: Cigarettes   Quit date: 07/22/1994   Years since quitting: 26.5  Smokeless Tobacco Never   Counseling given: Not Answered   Continue to not smoke  Outpatient Encounter Medications as of 01/24/2021  Medication Sig   acetaminophen (TYLENOL) 500 MG tablet Take 1,000 mg by mouth every 6 (six) hours as needed for moderate pain.   albuterol (PROVENTIL) (2.5 MG/3ML) 0.083% nebulizer solution Take 2.5 mg by nebulization every 6 (six) hours as needed for wheezing or shortness of breath.   aspirin EC 81 MG tablet Take 81 mg by mouth at bedtime.    atenolol (TENORMIN) 25 MG tablet TAKE 1 TABLET (25 MG TOTAL) BY MOUTH AT BEDTIME. TAKE THE 50 MG ATENOLOL IN AM AND THE 25 MG IN PM (Patient taking differently: Take 25 mg by mouth at bedtime.)   atenolol (TENORMIN) 50 MG tablet TAKE 1 TABLET BY MOUTH EVERY DAY (Patient taking differently: Take 50 mg by mouth every morning.)   BESIVANCE 0.6 % SUSP Place 1 drop into the left eye See admin instructions. Only use : 1 DROP INTO LEFT 4 TIMES DAILY x 2 DAYS AFTER EYE INJECTION   calcium carbonate (OSCAL) 1500 (600 Ca) MG TABS tablet Take 1,500 mg by mouth daily with breakfast.    Cholecalciferol (VITAMIN D) 2000 UNITS tablet Take 2,000 Units by mouth every evening.    CINNAMON PO Take 2,000 mg by mouth daily.   citalopram (CELEXA) 10 MG tablet Take 1 tablet (10 mg total) by mouth daily.   Coenzyme Q10 200 MG capsule Take 200 mg by mouth at bedtime.    Fluticasone-Umeclidin-Vilant (TRELEGY ELLIPTA) 100-62.5-25 MCG/INH AEPB Inhale 1 Inhaler into the lungs daily.   furosemide (LASIX) 40 MG tablet Take 1 tablet (  40 mg total) by mouth daily.   gabapentin (NEURONTIN) 300 MG capsule Take 1 capsule (300 mg total) by mouth at bedtime.    hydrochlorothiazide (MICROZIDE) 12.5 MG capsule TAKE 1 CAPSULE BY MOUTH EVERY DAY (Patient taking differently: Take 12.5 mg by mouth daily.)   ketoconazole (NIZORAL) 2 % shampoo Apply 1 application topically daily as needed for irritation (scalp).   levocetirizine (XYZAL) 5 MG tablet TAKE 1 TABLET BY MOUTH EVERY DAY IN THE EVENING (Patient taking differently: Take 5 mg by mouth every evening.)   levothyroxine (SYNTHROID) 112 MCG tablet Take 1 tablet (112 mcg total) by mouth daily before breakfast.   Omega-3 Fatty Acids (FISH OIL) 1200 MG CAPS Take 1,200 mg by mouth daily.   omeprazole (PRILOSEC) 40 MG capsule TAKE 1 CAPSULE BY MOUTH EVERY DAY (Patient taking differently: Take 40 mg by mouth daily.)   potassium chloride (KLOR-CON) 10 MEQ tablet Take 10 mEq by mouth every other day.   predniSONE (DELTASONE) 10 MG tablet Take 1 tablet (10 mg total) by mouth daily with breakfast for 7 days, THEN 0.5 tablets (5 mg total) daily with breakfast for 7 days.   rosuvastatin (CRESTOR) 20 MG tablet TAKE 1 TABLET BY MOUTH EVERY DAY (Patient taking differently: Take 20 mg by mouth every evening.)   Selexipag 1600 MCG TABS Take 1 tablet (1,600 mcg total) by mouth 2 (two) times daily.   Spacer/Aero-Holding Chambers (AEROCHAMBER PLUS WITH MASK) inhaler Use as instructed   tadalafil, PAH, (ADCIRCA) 20 MG tablet TAKE 2 TABLETS BY MOUTH EVERY DAY (Patient taking differently: Take 40 mg by mouth every morning.)   traMADol (ULTRAM) 50 MG tablet Take 1 tablet (50 mg total) by mouth every 6 (six) hours as needed for moderate pain or severe pain.   vitamin B-12 (CYANOCOBALAMIN) 1000 MCG tablet Take 1,000 mcg by mouth daily.   No facility-administered encounter medications on file as of 01/24/2021.     Review of Systems  Review of Systems  N/AA Physical Exam  BP 114/76   Pulse (!) 52   Ht _0  (1.626 m)   Wt 150 lb (68 kg)   SpO2 93% Comment: on 8L  BMI 25.75 kg/m   Wt Readings from Last 5 Encounters:   01/24/21 150 lb (68 kg)  01/24/21 153 lb (69.4 kg)  01/14/21 157 lb 6.5 oz (71.4 kg)  11/30/20 164 lb (74.4 kg)  09/14/20 167 lb 6.4 oz (75.9 kg)    BMI Readings from Last 5 Encounters:  01/24/21 25.75 kg/m  01/24/21 26.26 kg/m  01/14/21 27.02 kg/m  11/30/20 28.15 kg/m  09/14/20 29.65 kg/m     Physical Exam General: Well-appearing, no acute distress Eyes: EOMI, no icterus Pulmonary: Distant, no crackles, no wheeze Cardiovascular: Regular rate and rhythm, no murmurs   Assessment & Plan:   Pulmonary HTN: Group 3 (hypoxemia) and 5 (burnt out sarcoid). Recent admission for volume overload, hypoxemia in setting of reduction of Uptravi. Diuresed and improved to baseline 8L [place] at rest. Increasing uptravi (currently 1600 mcg BID) with planned 200 mcg every 2 weeks. Continue lasix 40 mg daily. Dry weight 150-152 pounds. Counseled on weight based increase in lasix dose.   Chronic Hypoxemic Respiratory Failure: Due to emphysema and prior effect of sarcoid.  We will petition or order new oxygen so she can have adequate portable oxygen.  Presented to clinic today 76% on her 5 L pulsed.  Again she requires 8 L continuous at rest.  She was counseled the importance of  this.  She does agree and wishes to pursue portable tanks.   Return in about 3 months (around 04/26/2021).   Lanier Clam, MD 01/24/2021   This appointment required 45 minutes of patient care (this includes precharting, chart review, review of results, face-to-face care, etc.).

## 2021-01-24 NOTE — Progress Notes (Signed)
Subjective:    Patient ID: Kimberly Irwin, female    DOB: 03/16/1946, 75 y.o.   MRN: [id number]  HPI  Pt in for follow up.  Admit date: 01/02/2021 Discharge date: 01/14/2021   Admitted From: Home Disposition: Home with home health   Recommendations for Outpatient Follow-up:  Follow up with PCP in 1-2 weeks Please obtain BMP/magnesium in one week your next doctors visit. Daily trilogy ordered 1 inhalation daily Prednisone 10 mg daily for 1 week followed by 5 mg daily Outpatient follow-up appointment in 2-3 weeks, will be arranged by pulmonary service Lasix changed to 40 mg daily Continue home supplemental oxygen.   Brief/Interim Summary: 75 y.o. female with medical history significant for COPD, sarcoidosis, HTN, HLD, HFpEF, cardiomegaly, chronic respiratory failure with hypoxia on oxygen at 7-8 L/min by [place] at home who presents for evaluation of worsening SOB. She presented by EMS on NRB and was started on Bipap in the ER.  Symptoms are combination of worsening pulmonary hypertension and COPD exacerbation.  Pulmonary team is following.  [name] was increased by pulmonary to 1600 mcg twice daily.  Over the course of several days patient's oxygen was weaned down from 13 L high flow nasal cannula to 8 L high flow nasal cannula which is around her baseline.  Today she is medically stable to be discharged with recommendations as stated above.  She was initially offered skilled nursing facility but patient declined therefore will be transition home with home health.   Assessment & Plan:   Principal Problem:   Acute on chronic respiratory failure with hypoxia (HCC) Active Problems:   Sarcoidosis stage I   Hyperlipidemia   Essential hypertension   Hypothyroidism   Cardiomegaly   Pulmonary HTN (HCC)   Chronic heart failure with preserved ejection fraction (HFpEF) (HCC)   COPD with acute exacerbation (HCC)   Prolonged QT interval    Acute on chronic respiratory failure with hypoxia, 8 L  high flow, improved -Symptoms are combination of worsening pulmonary hypertension, COPD exacerbation and underlying fibrosis.  Supplemental oxygen.  Baseline oxygen 8 L nasal cannula - Pulmonary follow-up outpatient in 2-3 weeks. - Continue bronchodilators-we will go home on daily Trelegy, I-S/flutter - Uptravi increased to 1600 mg twice daily. - Tadalafil - Prednisone 47m x 1 weeks, then 542mx 1 week then off.  - Follow-up outpatient pulmonary.  Will be arranged by their service     COPD, chronic -Continue bronchodilators and prednisone as prescribed     Essential hypertension -Atenolol      Pulmonary HTN -Status post RHC.  Continue tadalafil and Uptarvi    Chronic heart failure with preserved ejection fraction (HFpEF) -No signs of fluid overload at this time.   -Patient does have cardiomegaly on chest x-ray. -2d echo with preserved EF noted, Pt is s/p RHC 6/6 - Lasix 40 mg orally daily at home.  BMP and magnesium levels to be rechecked in 1 week.     Sarcoidosis stage I -On chronic prednisone at home.  Following PCCM.     Hyperlipidemia -Continue Crestor     Hypothyroidism -Daily Synthroid    T12 Compression fracture -Pain controlled.  PT recommended SNF, patient declined.  Will arrange for home health    Discharge Diagnoses:  Principal Problem:   Acute on chronic respiratory failure with hypoxia [place] Problems:   Sarcoidosis stage I   Hyperlipidemia   Essential hypertension   Hypothyroidism   Cardiomegaly   Pulmonary HTN (HCC)   Chronic heart  failure with preserved ejection fraction (HFpEF) ([place])   COPD with acute exacerbation (HCC)   Prolonged QT interval   At time of discharge pt stated t feels back to her baseline does not have any new complaints.   Pt tells me she feels very close to her baseline before admission.  Pt was discharged on 8 liter of 02.   Pt has appointment with pulmonologist at 12 pm today.    Review of Systems   Constitutional:  Negative for chills, fatigue and fever.  HENT:  Negative for congestion, ear pain and hearing loss.   Respiratory:  Positive for shortness of breath. Negative for cough, chest tightness and wheezing.        Close to former baseline.   Cardiovascular:  Negative for chest pain and palpitations.  Gastrointestinal:  Negative for abdominal pain and constipation.  Genitourinary:  Negative for dysuria, enuresis and flank pain.  Musculoskeletal:  Negative for back pain.  Skin:  Negative for pallor.      Objective:   Physical Exam  General- No acute distress. Pleasant patient. Neck- Full range of motion, no jvd Lungs- Clear, even and unlabored. Heart- regular rate and rhythm. Neurologic- CNII- XII grossly intact.   Lower ext- symmetric calves. No pedal edema. Negative homans signs.      Assessment & Plan:   For sarcoidosis and copd continue on inhalers prescribed by pullmonlogist and prednisonse. Continue on 02 per pulmonologist. Keep appointment later today. Currently very close/back to baseline.  Pt blood pressure tightly controlled. Continue current bp med regimen.  Hypothyroid. Continue current levothyroxine   Will get cmp and magnesium for follow up per discharge summary.  Follow up as regularly scheduled with pcp or as needed  Mackie Pai, PA-C

## 2021-01-24 NOTE — Patient Instructions (Signed)
Nice to see you  I plan to increase the Uptravi by 200 mcg every 2 weeks

## 2021-01-24 NOTE — Patient Instructions (Addendum)
For sarcoidosis and copd continue on inhalers prescribed by pulmonlogist and prednisone. Continue on 02 per pulmonologist. Keep appointment later today. Currently very close/back to baseline.  Pt blood pressure tightly controlled. Continue current bp med regimen.  Hypothyroid. Continue current levothyroxine   Will get cmp and magnesium for follow up per discharge summary.  Please get scheduled with cardiologist as well for chf.  Follow up as regularly scheduled with pcp or as needed

## 2021-01-25 DIAGNOSIS — I5032 Chronic diastolic (congestive) heart failure: Secondary | ICD-10-CM | POA: Diagnosis not present

## 2021-01-25 DIAGNOSIS — I110 Hypertensive heart disease with heart failure: Secondary | ICD-10-CM | POA: Diagnosis not present

## 2021-01-25 DIAGNOSIS — J441 Chronic obstructive pulmonary disease with (acute) exacerbation: Secondary | ICD-10-CM | POA: Diagnosis not present

## 2021-01-25 DIAGNOSIS — D860 Sarcoidosis of lung: Secondary | ICD-10-CM | POA: Diagnosis not present

## 2021-01-25 DIAGNOSIS — J9611 Chronic respiratory failure with hypoxia: Secondary | ICD-10-CM | POA: Diagnosis not present

## 2021-01-25 DIAGNOSIS — I2510 Atherosclerotic heart disease of native coronary artery without angina pectoris: Secondary | ICD-10-CM | POA: Diagnosis not present

## 2021-01-25 NOTE — Telephone Encounter (Signed)
Will close Esbriet BIV encounter for now. Patient's Kimberly Irwin will be optimized prior to reviewing need for Esbriet per Dr. Vaughan Browner and Dr. Silas Flood

## 2021-01-25 NOTE — Telephone Encounter (Signed)
Patient was approved for Esbriet through patient assistance prior to hospitalization on 01/02/21.  Discharged on 01/14/21. Dr. Silas Flood is now managing her [place] with plan to up-titrate her [name]. Dr. Vaughan Browner was managing her sarcoidosis prior to admission.  Dr. Silas Flood saw patient post-d/c on 01/24/21 and mentioned to me in person that she has baseline diarrhea and would suggest holding [place] for now.  Routing to Dr. Vaughan Browner and Dr. Silas Flood for future direction on initiating Esbriet or holding for now.  Knox Saliva, PharmD, MPH Clinical Pharmacist (Rheumatology and Pulmonology)

## 2021-01-29 ENCOUNTER — Telehealth: Payer: Self-pay

## 2021-01-29 DIAGNOSIS — I110 Hypertensive heart disease with heart failure: Secondary | ICD-10-CM | POA: Diagnosis not present

## 2021-01-29 DIAGNOSIS — I5032 Chronic diastolic (congestive) heart failure: Secondary | ICD-10-CM | POA: Diagnosis not present

## 2021-01-29 DIAGNOSIS — J9611 Chronic respiratory failure with hypoxia: Secondary | ICD-10-CM | POA: Diagnosis not present

## 2021-01-29 DIAGNOSIS — D860 Sarcoidosis of lung: Secondary | ICD-10-CM | POA: Diagnosis not present

## 2021-01-29 DIAGNOSIS — J441 Chronic obstructive pulmonary disease with (acute) exacerbation: Secondary | ICD-10-CM | POA: Diagnosis not present

## 2021-01-29 DIAGNOSIS — I2510 Atherosclerotic heart disease of native coronary artery without angina pectoris: Secondary | ICD-10-CM | POA: Diagnosis not present

## 2021-01-29 NOTE — Telephone Encounter (Signed)
Faxed Prior Authorization request to [facility] regarding [facility]. Will update once we receive a determination.  Phone# [phone] Fax# [phone]

## 2021-01-31 DIAGNOSIS — J441 Chronic obstructive pulmonary disease with (acute) exacerbation: Secondary | ICD-10-CM | POA: Diagnosis not present

## 2021-01-31 DIAGNOSIS — D860 Sarcoidosis of lung: Secondary | ICD-10-CM | POA: Diagnosis not present

## 2021-01-31 DIAGNOSIS — I110 Hypertensive heart disease with heart failure: Secondary | ICD-10-CM | POA: Diagnosis not present

## 2021-01-31 DIAGNOSIS — I2510 Atherosclerotic heart disease of native coronary artery without angina pectoris: Secondary | ICD-10-CM | POA: Diagnosis not present

## 2021-01-31 DIAGNOSIS — I5032 Chronic diastolic (congestive) heart failure: Secondary | ICD-10-CM | POA: Diagnosis not present

## 2021-01-31 DIAGNOSIS — J9611 Chronic respiratory failure with hypoxia: Secondary | ICD-10-CM | POA: Diagnosis not present

## 2021-01-31 NOTE — Telephone Encounter (Signed)
Received notice from [employer] that patient's current PA for Kimberly Irwin is good from 08/04/2020 until 08/03/2021. Nothing further is needed at this time.  Reference# [id number]

## 2021-02-01 ENCOUNTER — Other Ambulatory Visit (HOSPITAL_COMMUNITY): Payer: Self-pay | Admitting: Cardiology

## 2021-02-01 ENCOUNTER — Other Ambulatory Visit: Payer: Self-pay | Admitting: Family Medicine

## 2021-02-01 DIAGNOSIS — J9611 Chronic respiratory failure with hypoxia: Secondary | ICD-10-CM | POA: Diagnosis not present

## 2021-02-01 DIAGNOSIS — J441 Chronic obstructive pulmonary disease with (acute) exacerbation: Secondary | ICD-10-CM | POA: Diagnosis not present

## 2021-02-01 DIAGNOSIS — I5032 Chronic diastolic (congestive) heart failure: Secondary | ICD-10-CM | POA: Diagnosis not present

## 2021-02-01 DIAGNOSIS — K219 Gastro-esophageal reflux disease without esophagitis: Secondary | ICD-10-CM

## 2021-02-01 DIAGNOSIS — I110 Hypertensive heart disease with heart failure: Secondary | ICD-10-CM | POA: Diagnosis not present

## 2021-02-01 DIAGNOSIS — I2510 Atherosclerotic heart disease of native coronary artery without angina pectoris: Secondary | ICD-10-CM | POA: Diagnosis not present

## 2021-02-01 DIAGNOSIS — D860 Sarcoidosis of lung: Secondary | ICD-10-CM | POA: Diagnosis not present

## 2021-02-05 DIAGNOSIS — J9611 Chronic respiratory failure with hypoxia: Secondary | ICD-10-CM | POA: Diagnosis not present

## 2021-02-05 DIAGNOSIS — I5032 Chronic diastolic (congestive) heart failure: Secondary | ICD-10-CM | POA: Diagnosis not present

## 2021-02-05 DIAGNOSIS — J441 Chronic obstructive pulmonary disease with (acute) exacerbation: Secondary | ICD-10-CM | POA: Diagnosis not present

## 2021-02-05 DIAGNOSIS — I2510 Atherosclerotic heart disease of native coronary artery without angina pectoris: Secondary | ICD-10-CM | POA: Diagnosis not present

## 2021-02-05 DIAGNOSIS — I110 Hypertensive heart disease with heart failure: Secondary | ICD-10-CM | POA: Diagnosis not present

## 2021-02-05 DIAGNOSIS — D860 Sarcoidosis of lung: Secondary | ICD-10-CM | POA: Diagnosis not present

## 2021-02-07 DIAGNOSIS — J441 Chronic obstructive pulmonary disease with (acute) exacerbation: Secondary | ICD-10-CM | POA: Diagnosis not present

## 2021-02-07 DIAGNOSIS — I5032 Chronic diastolic (congestive) heart failure: Secondary | ICD-10-CM | POA: Diagnosis not present

## 2021-02-07 DIAGNOSIS — J9611 Chronic respiratory failure with hypoxia: Secondary | ICD-10-CM | POA: Diagnosis not present

## 2021-02-07 DIAGNOSIS — I110 Hypertensive heart disease with heart failure: Secondary | ICD-10-CM | POA: Diagnosis not present

## 2021-02-07 DIAGNOSIS — D860 Sarcoidosis of lung: Secondary | ICD-10-CM | POA: Diagnosis not present

## 2021-02-07 DIAGNOSIS — I2510 Atherosclerotic heart disease of native coronary artery without angina pectoris: Secondary | ICD-10-CM | POA: Diagnosis not present

## 2021-02-08 ENCOUNTER — Telehealth: Payer: Self-pay | Admitting: Family Medicine

## 2021-02-08 ENCOUNTER — Telehealth (INDEPENDENT_AMBULATORY_CARE_PROVIDER_SITE_OTHER): Payer: Medicare Other | Admitting: Family

## 2021-02-08 ENCOUNTER — Emergency Department (HOSPITAL_COMMUNITY)
Admission: EM | Admit: 2021-02-08 | Discharge: 2021-02-08 | Disposition: A | Discharge status: Home / Self Care | Payer: Medicare Other | Attending: Emergency Medicine | Admitting: Emergency Medicine

## 2021-02-08 ENCOUNTER — Other Ambulatory Visit: Payer: Self-pay

## 2021-02-08 ENCOUNTER — Encounter: Payer: Self-pay | Admitting: Family

## 2021-02-08 ENCOUNTER — Emergency Department (HOSPITAL_COMMUNITY): Payer: Medicare Other

## 2021-02-08 ENCOUNTER — Encounter (HOSPITAL_COMMUNITY): Payer: Self-pay | Admitting: Emergency Medicine

## 2021-02-08 VITALS — HR 60 | Ht 64.0 in | Wt 150.0 lb

## 2021-02-08 DIAGNOSIS — I509 Heart failure, unspecified: Secondary | ICD-10-CM | POA: Insufficient documentation

## 2021-02-08 DIAGNOSIS — R7989 Other specified abnormal findings of blood chemistry: Secondary | ICD-10-CM

## 2021-02-08 DIAGNOSIS — Z96643 Presence of artificial hip joint, bilateral: Secondary | ICD-10-CM | POA: Diagnosis not present

## 2021-02-08 DIAGNOSIS — E039 Hypothyroidism, unspecified: Secondary | ICD-10-CM | POA: Diagnosis not present

## 2021-02-08 DIAGNOSIS — R059 Cough, unspecified: Secondary | ICD-10-CM | POA: Insufficient documentation

## 2021-02-08 DIAGNOSIS — Z20822 Contact with and (suspected) exposure to covid-19: Secondary | ICD-10-CM | POA: Diagnosis not present

## 2021-02-08 DIAGNOSIS — Z7982 Long term (current) use of aspirin: Secondary | ICD-10-CM | POA: Diagnosis not present

## 2021-02-08 DIAGNOSIS — J449 Chronic obstructive pulmonary disease, unspecified: Secondary | ICD-10-CM | POA: Insufficient documentation

## 2021-02-08 DIAGNOSIS — R0602 Shortness of breath: Secondary | ICD-10-CM

## 2021-02-08 DIAGNOSIS — Z79899 Other long term (current) drug therapy: Secondary | ICD-10-CM | POA: Insufficient documentation

## 2021-02-08 DIAGNOSIS — Z87891 Personal history of nicotine dependence: Secondary | ICD-10-CM | POA: Diagnosis not present

## 2021-02-08 DIAGNOSIS — I517 Cardiomegaly: Secondary | ICD-10-CM | POA: Diagnosis not present

## 2021-02-08 DIAGNOSIS — I110 Hypertensive heart disease with heart failure: Secondary | ICD-10-CM | POA: Insufficient documentation

## 2021-02-08 DIAGNOSIS — R790 Abnormal level of blood mineral: Secondary | ICD-10-CM | POA: Diagnosis not present

## 2021-02-08 LAB — BASIC METABOLIC PANEL
Anion gap: 14 (ref 5–15)
BUN: 29 mg/dL — ABNORMAL HIGH (ref 8–23)
CO2: 26 mmol/L (ref 22–32)
Calcium: 9.3 mg/dL (ref 8.9–10.3)
Chloride: 101 mmol/L (ref 98–111)
Creatinine, Ser: 1.37 mg/dL — ABNORMAL HIGH (ref 0.44–1.00)
GFR, Estimated: 40 mL/min — ABNORMAL LOW (ref >60)
Glucose, Bld: 119 mg/dL — ABNORMAL HIGH (ref 70–99)
Potassium: 3.1 mmol/L — ABNORMAL LOW (ref 3.5–5.1)
Sodium: 141 mmol/L (ref 135–145)

## 2021-02-08 LAB — TROPONIN I (HIGH SENSITIVITY)
Troponin I (High Sensitivity): 17 ng/L (ref <18)
Troponin I (High Sensitivity): 16 ng/L (ref <18)

## 2021-02-08 LAB — CBC WITH DIFFERENTIAL/PLATELET
Abs Immature Granulocytes: 0.14 10*3/uL — ABNORMAL HIGH (ref 0.00–0.07)
Basophils Absolute: 0 10*3/uL (ref 0.0–0.1)
Basophils Relative: 0 %
Eosinophils Absolute: 0 10*3/uL (ref 0.0–0.5)
Eosinophils Relative: 0 %
HCT: 39.4 % (ref 36.0–46.0)
Hemoglobin: 13 g/dL (ref 12.0–15.0)
Immature Granulocytes: 1 %
Lymphocytes Relative: 9 %
Lymphs Abs: 0.9 10*3/uL (ref 0.7–4.0)
MCH: 30.7 pg (ref 26.0–34.0)
MCHC: 33 g/dL (ref 30.0–36.0)
MCV: 93.1 fL (ref 80.0–100.0)
Monocytes Absolute: 0.5 10*3/uL (ref 0.1–1.0)
Monocytes Relative: 5 %
Neutro Abs: 9.3 10*3/uL — ABNORMAL HIGH (ref 1.7–7.7)
Neutrophils Relative %: 85 %
Platelets: 175 10*3/uL (ref 150–400)
RBC: 4.23 MIL/uL (ref 3.87–5.11)
RDW: 16.7 % — ABNORMAL HIGH (ref 11.5–15.5)
WBC: 11 10*3/uL — ABNORMAL HIGH (ref 4.0–10.5)
nRBC: 0 % (ref 0.0–0.2)

## 2021-02-08 LAB — BRAIN NATRIURETIC PEPTIDE: B Natriuretic Peptide: 547.2 pg/mL — ABNORMAL HIGH (ref 0.0–100.0)

## 2021-02-08 LAB — RESP PANEL BY RT-PCR (FLU A&B, COVID) ARPGX2
Influenza A by PCR: NEGATIVE
Influenza B by PCR: NEGATIVE
SARS Coronavirus 2 by RT PCR: NEGATIVE

## 2021-02-08 MED ORDER — FUROSEMIDE 20 MG PO TABS: 20.0000 mg | ORAL_TABLET | Freq: Every day | ORAL | 0 refills | Status: DC

## 2021-02-08 NOTE — ED Provider Notes (Signed)
[facility] EMERGENCY DEPARTMENT Provider Note   CSN: [id number] Arrival date & time: 02/08/21  1226     History Chief Complaint  Patient presents with   Shortness of Breath    Kimberly Irwin is a 75 y.o. female.  Presents to ER with concern for shortness of breath.  Patient reports that the only new symptom today is really cough.  Cough started yesterday, nonproductive.  Wears 8 to 10 L nasal cannula at baseline.  Has quite extensive medical history including history of heart failure, pulmonary hypertension, sarcoidosis, COPD.  She has not been experiencing any chest pains, no chills or fevers.  No known sick contacts.  HPI     Past Medical History:  Diagnosis Date   Bursitis of left hip 10/26/2012   Chronic low back pain    Chronic respiratory failure with hypoxia (HCC) 10/21/2016   Coronary artery calcification 07/03/2016   DDD (degenerative disc disease)    L3-4 with facet arthropathy and stenosis   Degenerative spondylolisthesis    L4-5 grade 1 with stenosis   Emphysema lung (HCC)    GERD (gastroesophageal reflux disease)    Hiatal hernia    Hyperlipidemia    Hypertension    Hypothyroidism 10/28/2015   Pain in joint, lower leg 10/26/2012   left    Pneumonia    as a child several times.    Pulmonary hypertension ([place])    Sarcoidosis    Tremors of nervous system     Patient Active Problem List   Diagnosis Date Noted   Chronic heart failure with preserved ejection fraction (HFpEF) ([place]) 01/02/2021   COPD with acute exacerbation ([place]) 01/02/2021   Prolonged QT interval 01/02/2021   Diarrhea 09/15/2018   Pulmonary HTN ([place]) 01/28/2018   Acute on chronic respiratory failure with hypoxia ([place]) 01/28/2018   Epistaxis 12/28/2017   Hypoxia, sleep related 10/21/2016   Cardiomegaly 07/13/2016   Coronary artery calcification 07/03/2016   COPD (chronic obstructive pulmonary disease) ([place]) 07/01/2016   Hypersomnia 07/01/2016   Chronic respiratory failure  with hypoxia ([place]) 05/20/2016   Dermatitis 04/14/2016   SOB (shortness of breath) 04/03/2016   Hypothyroidism 10/28/2015   Lumbar compression fracture ([place]) 09/14/2014   Medicare annual wellness visit, subsequent 04/27/2014   Obesity    S/P left THA, AA 06/28/2013   Decreased visual acuity 02/19/2013   Hyperglycemia 04/04/2011   Chronic pain syndrome 01/20/2011   Depression 08/10/2008   GERD 04/26/2008   Sinusitis 08/02/2007   Lumbar disc disease 05/31/2007   Sarcoidosis stage I 05/29/2007   Hyperlipidemia 05/29/2007   Tremor    Essential hypertension     Past Surgical History:  Procedure Laterality Date   ANTERIOR LAT LUMBAR FUSION Left 08/29/2014   Procedure: EXTREME LEFT LATERAL INTERBODY FUSION LUMBAR TWO-THREE LATERAL PLATE;  Surgeon: Charlie Pitter, MD;  Location: [facility];  Service: Neurosurgery;  Laterality: Left;   BACK SURGERY  01/15/09   L3-4 and L4-5 decompressive laminectomy with bilateral L3, L4, and L5 decompressive foraminotomies, more than it would be required for simple interbody fusion alone.   BACK SURGERY  01/15/09   L3-4 and L4-5 posterior lumbar interbody fusion utilizing tanget interbody allograft wedge, Telamon interbody PEEk cage, and local autografting.   BACK SURGERY  01/15/09   L3, L4, and L5 posterolateral arthrodesis using segmental pedicle screw fixation and local autografting.   CARDIAC CATHETERIZATION     CARPAL TUNNEL RELEASE Bilateral    COLONOSCOPY     ESOPHAGOGASTRODUODENOSCOPY  EYE SURGERY     lens implant   JOINT REPLACEMENT  09/13/09   Right Hip, Dr. Alvan Dame   KYPHOPLASTY N/A 09/14/2014   Procedure: Lumbar Two Kyphoplasty/Vertebroplasty;  Surgeon: Charlie Pitter, MD;  Location: [facility];  Service: Neurosurgery;  Laterality: N/A;  Lumbar Two Kyphoplasty/Vertebroplasty   RIGHT HEART CATH N/A 11/19/2017   Procedure: RIGHT HEART CATH;  Surgeon: Larey Dresser, MD;  Location: [place];  Service: Cardiovascular;  Laterality: N/A;    RIGHT HEART CATH N/A 05/27/2019   Procedure: RIGHT HEART CATH;  Surgeon: Larey Dresser, MD;  Location: [facility];  Service: Cardiovascular;  Laterality: N/A;   RIGHT HEART CATH N/A 01/07/2021   Procedure: RIGHT HEART CATH;  Surgeon: Larey Dresser, MD;  Location: [place] [facility];  Service: Cardiovascular;  Laterality: N/A;   THYROIDECTOMY     TOTAL HIP ARTHROPLASTY Left 06/28/2013   Procedure: LEFT TOTAL  HIP ARTHROPLASTY ANTERIOR APPROACH;  Surgeon: Mauri Pole, MD;  Location: [facility];  Service: Orthopedics;  Laterality: Left;     OB History   No obstetric history on file.     Family History  Problem Relation Age of Onset   Cancer Mother 45       Breast   Heart disease Mother    Hypertension Mother    Hyperlipidemia Mother    Heart attack Mother    Asthma Maternal Grandmother     Social History   Tobacco Use   Smoking status: Former    Packs/day: 0.50    Years: 32.00    Pack years: 16.00    Types: Cigarettes    Quit date: 07/22/1994    Years since quitting: 26.5   Smokeless tobacco: Never  Substance Use Topics   Alcohol use: No    Alcohol/week: 0.0 standard drinks   Drug use: No    Home Medications Prior to Admission medications   Medication Sig Start Date End Date Taking? Authorizing Provider  acetaminophen (TYLENOL) 500 MG tablet Take 1,000 mg by mouth every 6 (six) hours as needed for moderate pain.   Yes [provider]  albuterol (PROVENTIL) (2.5 MG/3ML) 0.083% nebulizer solution Take 2.5 mg by nebulization every 6 (six) hours as needed for wheezing or shortness of breath.   Yes [provider]  aspirin EC 81 MG tablet Take 81 mg by mouth at bedtime.    Yes [provider]  atenolol (TENORMIN) 25 MG tablet TAKE 1 TABLET (25 MG TOTAL) BY MOUTH AT BEDTIME. TAKE THE 50 MG ATENOLOL IN AM AND THE 25 MG IN PM Patient taking differently: Take 25 mg by mouth at bedtime. 08/15/20  Yes Mosie Lukes, MD  atenolol (TENORMIN)  50 MG tablet TAKE 1 TABLET BY MOUTH EVERY DAY Patient taking differently: Take 50 mg by mouth daily. 02/01/21  Yes Mosie Lukes, MD  BESIVANCE 0.6 % SUSP Place 1 drop into the left eye See admin instructions. 1 drop into left eye QID for 2 days after eye injection 12/18/14  Yes [provider]  calcium carbonate (OSCAL) 1500 (600 Ca) MG TABS tablet Take 1,500 mg by mouth daily with breakfast.    Yes [provider]  Cholecalciferol (VITAMIN D) 2000 UNITS tablet Take 2,000 Units by mouth every evening.    Yes [provider]  CINNAMON PO Take 2,000 mg by mouth daily.   Yes [provider]  citalopram (CELEXA) 10 MG tablet Take 1 tablet (10  mg total) by mouth daily. Patient taking differently: Take 10 mg by mouth at bedtime. 09/14/20  Yes Saguier, Percell Miller, PA-C  Coenzyme Q10 200 MG capsule Take 200 mg by mouth at bedtime.    Yes [provider]  Fexofenadine HCl (ALLEGRA ALLERGY PO) Take 1 tablet by mouth daily.   Yes [provider]  Fluticasone-Umeclidin-Vilant (TRELEGY ELLIPTA) 100-62.5-25 MCG/INH AEPB Inhale 1 Inhaler into the lungs daily. 01/14/21  Yes Amin, Jeanella Flattery, MD  furosemide (LASIX) 40 MG tablet Take 1 tablet (40 mg total) by mouth daily. 01/15/21  Yes Amin, Jeanella Flattery, MD  gabapentin (NEURONTIN) 300 MG capsule Take 1 capsule (300 mg total) by mouth at bedtime. 09/14/20  Yes Saguier, Percell Miller, PA-C  hydrochlorothiazide (MICROZIDE) 12.5 MG capsule TAKE 1 CAPSULE BY MOUTH EVERY DAY Patient taking differently: Take 12.5 mg by mouth daily. 02/01/21  Yes Larey Dresser, MD  ketoconazole (NIZORAL) 2 % shampoo Apply 1 application topically daily as needed for irritation (scalp).   Yes [provider]  levocetirizine (XYZAL) 5 MG tablet TAKE 1 TABLET BY MOUTH EVERY DAY IN THE EVENING Patient taking differently: Take 5 mg by mouth every evening. 12/06/20  Yes Saguier, Percell Miller, PA-C  levothyroxine (SYNTHROID) 112 MCG tablet TAKE 1 TABLET  BY MOUTH DAILY BEFORE BREAKFAST. Patient taking differently: Take 112 mcg by mouth daily before breakfast. 02/01/21  Yes Mosie Lukes, MD  Omega-3 Fatty Acids (FISH OIL) 1200 MG CAPS Take 1,200 mg by mouth daily.   Yes [provider]  omeprazole (PRILOSEC) 40 MG capsule TAKE 1 CAPSULE BY MOUTH EVERY DAY Patient taking differently: Take 40 mg by mouth daily. 02/01/21  Yes Mosie Lukes, MD  potassium chloride (KLOR-CON) 10 MEQ tablet Take 10 mEq by mouth every other day. 04/02/20  Yes [provider]  predniSONE (DELTASONE) 5 MG tablet Take 5 mg by mouth daily with breakfast.   Yes [provider]  rosuvastatin (CRESTOR) 20 MG tablet TAKE 1 TABLET BY MOUTH EVERY DAY Patient taking differently: Take 20 mg by mouth daily. 02/01/21  Yes Mosie Lukes, MD  Selexipag 1600 MCG TABS Take 1 tablet (1,600 mcg total) by mouth 2 (two) times daily. 01/14/21  Yes Amin, Ankit Chirag, MD  tadalafil, PAH, (ADCIRCA) 20 MG tablet TAKE 2 TABLETS BY MOUTH EVERY DAY Patient taking differently: Take 40 mg by mouth every morning. 10/16/20  Yes Larey Dresser, MD  traMADol (ULTRAM) 50 MG tablet Take 1 tablet (50 mg total) by mouth every 6 (six) hours as needed for moderate pain or severe pain. 01/14/21 01/14/22 Yes Amin, Jeanella Flattery, MD  Umeclidinium Bromide (INCRUSE ELLIPTA IN) Inhale 1 puff into the lungs daily.   Yes [provider]  vitamin B-12 (CYANOCOBALAMIN) 1000 MCG tablet Take 1,000 mcg by mouth daily.   Yes [provider]  Spacer/Aero-Holding Chambers (AEROCHAMBER PLUS WITH MASK) inhaler Use as instructed 01/28/18   Debbe Odea, MD    Allergies    Lipitor [atorvastatin], Penicillins, Metronidazole, Pregabalin, and Simvastatin  Review of Systems   Review of Systems  Constitutional:  Negative for chills and fever.  HENT:  Negative for ear pain and sore throat.   Eyes:  Negative for pain and visual disturbance.  Respiratory:  Positive for cough and shortness of  breath.   Cardiovascular:  Negative for chest pain and palpitations.  Gastrointestinal:  Negative for abdominal pain and vomiting.  Genitourinary:  Negative for dysuria and hematuria.  Musculoskeletal:  Negative for arthralgias and back  pain.  Skin:  Negative for color change and rash.  Neurological:  Negative for seizures and syncope.  All other systems reviewed and are negative.  Physical Exam Updated Vital Signs BP 98/61 (BP Location: Right Arm)   Pulse 63   Temp 99 F (37.2 C) (Oral)   Resp 19   SpO2 90%   Physical Exam Vitals and nursing note reviewed.  Constitutional:      General: She is not in acute distress.    Appearance: She is well-developed.  HENT:     Head: Normocephalic and atraumatic.  Eyes:     Conjunctiva/sclera: Conjunctivae normal.  Cardiovascular:     Rate and Rhythm: Normal rate and regular rhythm.     Heart sounds: No murmur heard. Pulmonary:     Effort: Pulmonary effort is normal. No respiratory distress.     Breath sounds: Normal breath sounds.     Comments: Mild tachypnea but no distress on 10 L nasal cannula, no wheezes or crackles Abdominal:     Palpations: Abdomen is soft.     Tenderness: There is no abdominal tenderness.  Musculoskeletal:     Cervical back: Neck supple.  Skin:    General: Skin is warm and dry.  Neurological:     Mental Status: She is alert.    ED Results / Procedures / Treatments   Labs (all labs ordered are listed, but only abnormal results are displayed) Labs Reviewed  CBC WITH DIFFERENTIAL/PLATELET - Abnormal; Notable for the following components:      Result Value   WBC 11.0 (*)    RDW 16.7 (*)    Neutro Abs 9.3 (*)    Abs Immature Granulocytes 0.14 (*)    All other components within normal limits  BASIC METABOLIC PANEL - Abnormal; Notable for the following components:   Potassium 3.1 (*)    Glucose, Bld 119 (*)    BUN 29 (*)    Creatinine, Ser 1.37 (*)    GFR, Estimated 40 (*)    All other components  within normal limits  BRAIN NATRIURETIC PEPTIDE - Abnormal; Notable for the following components:   B Natriuretic Peptide 547.2 (*)    All other components within normal limits  RESP PANEL BY RT-PCR (FLU A&B, COVID) ARPGX2  TROPONIN I (HIGH SENSITIVITY)  TROPONIN I (HIGH SENSITIVITY)    EKG None  Radiology DG Chest Portable 1 View  Result Date: 02/08/2021 CLINICAL DATA:  Shortness of breath, cough EXAM: PORTABLE CHEST 1 VIEW COMPARISON:  01/02/2021 FINDINGS: Stable cardiomegaly. Atherosclerotic calcification of the aortic knob. Chronically coarsened interstitial markings, most pronounced within the bibasilar region. No new airspace consolidation. No pleural effusion or pneumothorax. IMPRESSION: Stable cardiomegaly. No new acute cardiopulmonary findings. Electronically Signed   By: Davina Poke D.O.   On: 02/08/2021 14:27    Procedures Procedures   Medications Ordered in ED Medications - No data to display  ED Course  I have reviewed the triage vital signs and the nursing notes.  Pertinent labs & imaging results that were available during my care of the patient were reviewed by me and considered in my medical decision making (see chart for details).    MDM Rules/Calculators/A&P                          75 year old lady with history of pulmonary hypertension, COPD, heart failure presented to ER with concern for cough and shortness of breath.  On exam patient is chronically ill-appearing  but not in any acute distress.  When I initially saw patient she was on 6 L nasal cannula and was borderline hypoxic, later changed to 10 L nasal cannula and O2 saturations improved.  Chest x-ray was negative.  Ordered labs to further evaluate.  While awaiting lab work and reassessment, signed out to Dr. Sherry Ruffing.  Please refer to his note for final plan and disposition.   Final Clinical Impression(s) / ED Diagnoses Final diagnoses:  Shortness of breath  Elevated brain natriuretic peptide (BNP)  level    Rx / DC Orders ED Discharge Orders     None        Lucrezia Starch, MD 02/09/21 1012

## 2021-02-08 NOTE — ED Provider Notes (Signed)
Emergency Medicine Provider Triage Evaluation Note  Kimberly Irwin , a 75 y.o. female  was evaluated in triage.  Pt complains of shortness of breath and cough with she wears chronic oxygen at baseline.  She states that she has chest pain which incontinence feels short of breath with denies any other significant symptoms denies any lightheadedness or dizziness.  Blood pressure 77/48.  Is mildly bradycardic.  Review of Systems  Positive: Cough, sob Negative: fever  Physical Exam  BP (!) 77/48   Pulse 64   Temp 99 F (37.2 C) (Oral)   Resp (!) 22   SpO2 94%  Gen:   Awake, no distress  Resp:  Normal effort  MSK:   Moves extremities without difficulty  Other:  Lungs without significant wheezing  Medical Decision Making  Medically screening exam initiated at 12:58 PM.  Appropriate orders placed.  Kimberly Irwin was informed that the remainder of the evaluation will be completed by another provider, this initial triage assessment does not replace that evaluation, and the importance of remaining in the ED until their evaluation is complete.  Patient is hypertensive.  This is a concerning combination.  Patient will be moved immediately back to the resuscitation room.  Speaking in full sentences.  Not in acute distress.   Kimberly Irwin [place], [place] 02/08/21 1259    Valarie Merino, MD 02/10/21 2059

## 2021-02-08 NOTE — ED Triage Notes (Signed)
Patient with history of COPD here with complaint of shortness of breath and cough that started yesterday. Patient wears 8-10 L/min [place] at baseline. Patient alert, oriented, and in no apparent distress at this time.

## 2021-02-08 NOTE — Telephone Encounter (Signed)
Patient had vv with Jodi Mourning NP. Please see note.

## 2021-02-08 NOTE — Discharge Instructions (Addendum)
Your history and exam today are consistent with some fluid overload leading to your increased breathing difficulty.  We confirmed that you did have some extra fluid on board with the BNP that was more elevated but after discussion, you did not want admission.  Given your oxygen saturations being in the 90s on your home oxygen again, after our shared decision conversation, we agreed to increase your Lasix from 40 daily in the evenings to 40 in the evenings and then 20 in the mornings and then call your PCP tomorrow to discuss these changes.  If any symptoms change or worsen, please return to the nearest emergency department.

## 2021-02-08 NOTE — Progress Notes (Signed)
Patient called the office and spoke with front staff. Sherri asked me to speak with patient. I spoke with patient. She states she was in the hospital a few weeks ago-admitted for 2 weeks for hypoxia. Since she has been home she is still experiencing a worsening cough. Cough is productive of brown tinged sputum. She is wheezing. No fever. But is experiencing shortness of breath and chest soreness from coughing.Patient has COPD and is currently on 8L o2.  No recent covid test within the last 24 hours however she tested negative while admitted to hospital. She has had 3 doses of moderna. I advised her it would be best if she came in to be evaluated with her worsening symptoms. She stated there was no way she could come in as she is too weak.   Virtual visit has been scheduled with Jodi Mourning for now.

## 2021-02-08 NOTE — Progress Notes (Signed)
Kimberly Irwin is a 75 y.o. female with the following history as recorded in EpicCare:  Patient Active Problem List   Diagnosis Date Noted   Chronic heart failure with preserved ejection fraction (HFpEF) ([place]) 01/02/2021   COPD with acute exacerbation ([place]) 01/02/2021   Prolonged QT interval 01/02/2021   Diarrhea 09/15/2018   Pulmonary HTN ([place]) 01/28/2018   Acute on chronic respiratory failure with hypoxia ([place]) 01/28/2018   Epistaxis 12/28/2017   Hypoxia, sleep related 10/21/2016   Cardiomegaly 07/13/2016   Coronary artery calcification 07/03/2016   COPD (chronic obstructive pulmonary disease) ([place]) 07/01/2016   Hypersomnia 07/01/2016   Chronic respiratory failure with hypoxia ([place]) 05/20/2016   Dermatitis 04/14/2016   SOB (shortness of breath) 04/03/2016   Hypothyroidism 10/28/2015   Lumbar compression fracture ([place]) 09/14/2014   Medicare annual wellness visit, subsequent 04/27/2014   Obesity    S/P left THA, AA 06/28/2013   Decreased visual acuity 02/19/2013   Hyperglycemia 04/04/2011   Chronic pain syndrome 01/20/2011   Depression 08/10/2008   GERD 04/26/2008   Sinusitis 08/02/2007   Lumbar disc disease 05/31/2007   Sarcoidosis stage I 05/29/2007   Hyperlipidemia 05/29/2007   Tremor    Essential hypertension     Current Outpatient Medications  Medication Sig Dispense Refill   acetaminophen (TYLENOL) 500 MG tablet Take 1,000 mg by mouth every 6 (six) hours as needed for moderate pain.     albuterol (PROVENTIL) (2.5 MG/3ML) 0.083% nebulizer solution Take 2.5 mg by nebulization every 6 (six) hours as needed for wheezing or shortness of breath.     aspirin EC 81 MG tablet Take 81 mg by mouth at bedtime.      atenolol (TENORMIN) 25 MG tablet TAKE 1 TABLET (25 MG TOTAL) BY MOUTH AT BEDTIME. TAKE THE 50 MG ATENOLOL IN AM AND THE 25 MG IN PM (Patient taking differently: Take 25 mg by mouth at bedtime.) 90 tablet 3   atenolol (TENORMIN) 50 MG tablet TAKE 1 TABLET BY MOUTH EVERY  DAY 90 tablet 1   BESIVANCE 0.6 % SUSP Place 1 drop into the left eye See admin instructions. Only use : 1 DROP INTO LEFT 4 TIMES DAILY x 2 DAYS AFTER EYE INJECTION  12   calcium carbonate (OSCAL) 1500 (600 Ca) MG TABS tablet Take 1,500 mg by mouth daily with breakfast.      Cholecalciferol (VITAMIN D) 2000 UNITS tablet Take 2,000 Units by mouth every evening.      CINNAMON PO Take 2,000 mg by mouth daily.     citalopram (CELEXA) 10 MG tablet Take 1 tablet (10 mg total) by mouth daily. 30 tablet 11   Coenzyme Q10 200 MG capsule Take 200 mg by mouth at bedtime.      Fluticasone-Umeclidin-Vilant (TRELEGY ELLIPTA) 100-62.5-25 MCG/INH AEPB Inhale 1 Inhaler into the lungs daily. 28 each 0   furosemide (LASIX) 40 MG tablet Take 1 tablet (40 mg total) by mouth daily. 30 tablet 0   gabapentin (NEURONTIN) 300 MG capsule Take 1 capsule (300 mg total) by mouth at bedtime. 30 capsule 5   hydrochlorothiazide (MICROZIDE) 12.5 MG capsule TAKE 1 CAPSULE BY MOUTH EVERY DAY 90 capsule 3   ketoconazole (NIZORAL) 2 % shampoo Apply 1 application topically daily as needed for irritation (scalp).     levocetirizine (XYZAL) 5 MG tablet TAKE 1 TABLET BY MOUTH EVERY DAY IN THE EVENING (Patient taking differently: Take 5 mg by mouth every evening.) 90 tablet 1   levothyroxine (SYNTHROID) 112 MCG  tablet TAKE 1 TABLET BY MOUTH DAILY BEFORE BREAKFAST. 30 tablet 0   Omega-3 Fatty Acids (FISH OIL) 1200 MG CAPS Take 1,200 mg by mouth daily.     omeprazole (PRILOSEC) 40 MG capsule TAKE 1 CAPSULE BY MOUTH EVERY DAY 30 capsule 0   potassium chloride (KLOR-CON) 10 MEQ tablet Take 10 mEq by mouth every other day.     rosuvastatin (CRESTOR) 20 MG tablet TAKE 1 TABLET BY MOUTH EVERY DAY 90 tablet 1   Selexipag 1600 MCG TABS Take 1 tablet (1,600 mcg total) by mouth 2 (two) times daily. 60 tablet    Spacer/Aero-Holding Chambers (AEROCHAMBER PLUS WITH MASK) inhaler Use as instructed 1 each 2   tadalafil, PAH, (ADCIRCA) 20 MG tablet TAKE 2  TABLETS BY MOUTH EVERY DAY (Patient taking differently: Take 40 mg by mouth every morning.) 60 tablet 11   traMADol (ULTRAM) 50 MG tablet Take 1 tablet (50 mg total) by mouth every 6 (six) hours as needed for moderate pain or severe pain. 20 tablet 0   vitamin B-12 (CYANOCOBALAMIN) 1000 MCG tablet Take 1,000 mcg by mouth daily.     No current facility-administered medications for this visit.    Allergies: Lipitor [atorvastatin], Penicillins, Metronidazole, Pregabalin, and Simvastatin  Past Medical History:  Diagnosis Date   Bursitis of left hip 10/26/2012   Chronic low back pain    Chronic respiratory failure with hypoxia ([place]) 10/21/2016   Coronary artery calcification 07/03/2016   DDD (degenerative disc disease)    L3-4 with facet arthropathy and stenosis   Degenerative spondylolisthesis    L4-5 grade 1 with stenosis   Emphysema lung ([facility])    GERD (gastroesophageal reflux disease)    Hiatal hernia    Hyperlipidemia    Hypertension    Hypothyroidism 10/28/2015   Pain in joint, lower leg 10/26/2012   left    Pneumonia    as a child several times.    Pulmonary hypertension ([place])    Sarcoidosis    Tremors of nervous system     Past Surgical History:  Procedure Laterality Date   ANTERIOR LAT LUMBAR FUSION Left 08/29/2014   Procedure: EXTREME LEFT LATERAL INTERBODY FUSION LUMBAR TWO-THREE LATERAL PLATE;  Surgeon: Charlie Pitter, MD;  Location: [facility];  Service: Neurosurgery;  Laterality: Left;   BACK SURGERY  01/15/09   L3-4 and L4-5 decompressive laminectomy with bilateral L3, L4, and L5 decompressive foraminotomies, more than it would be required for simple interbody fusion alone.   BACK SURGERY  01/15/09   L3-4 and L4-5 posterior lumbar interbody fusion utilizing tanget interbody allograft wedge, Telamon interbody PEEk cage, and local autografting.   BACK SURGERY  01/15/09   L3, L4, and L5 posterolateral arthrodesis using segmental pedicle screw fixation and local autografting.    CARDIAC CATHETERIZATION     CARPAL TUNNEL RELEASE Bilateral    COLONOSCOPY     ESOPHAGOGASTRODUODENOSCOPY     EYE SURGERY     lens implant   JOINT REPLACEMENT  09/13/09   Right Hip, Dr. Alvan Dame   KYPHOPLASTY N/A 09/14/2014   Procedure: Lumbar Two Kyphoplasty/Vertebroplasty;  Surgeon: Charlie Pitter, MD;  Location: [facility];  Service: Neurosurgery;  Laterality: N/A;  Lumbar Two Kyphoplasty/Vertebroplasty   RIGHT HEART CATH N/A 11/19/2017   Procedure: RIGHT HEART CATH;  Surgeon: Larey Dresser, MD;  Location: [facility];  Service: Cardiovascular;  Laterality: N/A;   RIGHT HEART CATH N/A 05/27/2019   Procedure: RIGHT HEART CATH;  Surgeon: Aundra Dubin,  Elby Showers, MD;  Location: [facility];  Service: Cardiovascular;  Laterality: N/A;   RIGHT HEART CATH N/A 01/07/2021   Procedure: RIGHT HEART CATH;  Surgeon: Larey Dresser, MD;  Location: [facility];  Service: Cardiovascular;  Laterality: N/A;   THYROIDECTOMY     TOTAL HIP ARTHROPLASTY Left 06/28/2013   Procedure: LEFT TOTAL  HIP ARTHROPLASTY ANTERIOR APPROACH;  Surgeon: Mauri Pole, MD;  Location: [facility];  Service: Orthopedics;  Laterality: Left;    Family History  Problem Relation Age of Onset   Cancer Mother 69       Breast   Heart disease Mother    Hypertension Mother    Hyperlipidemia Mother    Heart attack Mother    Asthma Maternal Grandmother     Social History   Tobacco Use   Smoking status: Former    Packs/day: 0.50    Years: 32.00    Pack years: 16.00    Types: Cigarettes    Quit date: 07/22/1994    Years since quitting: 26.5   Smokeless tobacco: Never  Substance Use Topics   Alcohol use: No    Alcohol/week: 0.0 standard drinks    Subjective:   I connected with Kimberly Irwin on 02/08/21 at 10:20 AM EDT by a telephone call and verified that I am speaking with the correct person using two identifiers.   I discussed the limitations of evaluation and management by telemedicine and the availability  of in person appointments. The patient expressed understanding and agreed to proceed. Provider in office/ patient is at home; provider and patient are only 2 people on telephone call.   Patient called the office with concerns for chronic cough; very weak sounded and winded on the phone; notes that she just really started to feel bad last night; is under care of pulmonology for pulmonary hypertension/ sarcoidosis and has history of CHF; patient notes she does not want to go to ER but does agree to let me call her daughter ( who is not present with patient) to discuss treatment.     Objective:  Vitals:   02/08/21 1010  Pulse: 60  SpO2: 93%  Weight: 150 lb (68 kg)  Height: _0  (1.626 m)    Lungs: Respirations labored and winded;  Neurologic: Alert and oriented;   Assessment:  1. SOB (shortness of breath)     Plan:  Patient sounds to be in distress on call; I did speak to patient's daughter about our concerns and she agrees to take her mother to [facility] ER. Suspect patient will need to be admitted; follow up to be determined;  Time spent 12 minutes  No follow-ups on file.  No orders of the defined types were placed in this encounter.   Requested Prescriptions    No prescriptions requested or ordered in this encounter

## 2021-02-11 ENCOUNTER — Telehealth: Payer: Self-pay | Admitting: Pulmonary Disease

## 2021-02-11 DIAGNOSIS — R0609 Other forms of dyspnea: Secondary | ICD-10-CM

## 2021-02-11 DIAGNOSIS — I2729 Other secondary pulmonary hypertension: Secondary | ICD-10-CM

## 2021-02-11 DIAGNOSIS — R0600 Dyspnea, unspecified: Secondary | ICD-10-CM

## 2021-02-11 DIAGNOSIS — R3589 Other polyuria: Secondary | ICD-10-CM

## 2021-02-11 NOTE — Telephone Encounter (Signed)
Pt is calling in regards to a letter from [place] that they are gonna start sending her medication over to the [facility] [address] in [place] and she was wanting to make [place] aware of it stated it will be for the medication; Uptravi.  Pls regard; [phone]

## 2021-02-12 ENCOUNTER — Telehealth: Payer: Self-pay | Admitting: Family Medicine

## 2021-02-12 DIAGNOSIS — I110 Hypertensive heart disease with heart failure: Secondary | ICD-10-CM | POA: Diagnosis not present

## 2021-02-12 DIAGNOSIS — I2510 Atherosclerotic heart disease of native coronary artery without angina pectoris: Secondary | ICD-10-CM | POA: Diagnosis not present

## 2021-02-12 DIAGNOSIS — J9611 Chronic respiratory failure with hypoxia: Secondary | ICD-10-CM | POA: Diagnosis not present

## 2021-02-12 DIAGNOSIS — D860 Sarcoidosis of lung: Secondary | ICD-10-CM | POA: Diagnosis not present

## 2021-02-12 DIAGNOSIS — I5032 Chronic diastolic (congestive) heart failure: Secondary | ICD-10-CM | POA: Diagnosis not present

## 2021-02-12 DIAGNOSIS — J441 Chronic obstructive pulmonary disease with (acute) exacerbation: Secondary | ICD-10-CM | POA: Diagnosis not present

## 2021-02-12 MED ORDER — FUROSEMIDE 40 MG PO TABS: 40.0000 mg | ORAL_TABLET | Freq: Every day | ORAL | 0 refills | Status: DC

## 2021-02-12 NOTE — Telephone Encounter (Signed)
[facility] states pt is wearing 8 liters oxygen, but at rest sat 86 to 90 % with activity desat 75%. Pt oxygen is dropping with any activity.  Please call Camryn at [phone]

## 2021-02-12 NOTE — Telephone Encounter (Signed)
Spoke with Nurse from [facility] and gave her the number to pulmonology.

## 2021-02-12 NOTE — Telephone Encounter (Signed)
Called [facility], they last shipped Uptravi to patient's physical address at [address], [place] [address] on 01/31/21. They do not see where any letter was sent to patient.  Called patient, she could not locate the letter. She advised she would like to continue to receive medication delivery to her home. Advised [facility] has her physical address on file and will continue to ship there.  Nothing further is needed.

## 2021-02-12 NOTE — Telephone Encounter (Signed)
Spoke with Lysbeth Galas and she stated that patient was in the ED on 02/08/21 and they increased her Lasix to 26m.  She feels that patient should not be using extension O2 and use sitting concentrated direct standard course.

## 2021-02-12 NOTE — Telephone Encounter (Signed)
Recent ED visit. BNP elevated. Additional 20 mg lasix daily prescribed x 7 days.  Refilled maintenance lasix 40 mg daily. Ordered repeat BMP, mag, BNP for later this week. Reduced prednisone to 5 mg daily x 7 days then instructed to stop.  Patient/daughter to let us know what patient weight is and what dose of Uptravi she is taking.   Further direction pending answers.

## 2021-02-13 NOTE — Telephone Encounter (Signed)
Can you please send in new rx to [facility]? The pharmacist is on vacation this week.   The pharmacy team will start Uptravi paperwork, which may be required since Dr. Silas Flood is taking over therapy.  Thanks!

## 2021-02-13 NOTE — Telephone Encounter (Signed)
Received the following response in regards to her weight and medication:   "Good evening. Thank you. She is currently taking 1600 MG and as of this morning,  her weight was 154.   Thank you again. [name]"

## 2021-02-13 NOTE — Telephone Encounter (Signed)
Weight is up from baseline. Continue extra lasix 20 mg in PM. Labs this week. If weight is above 152 pounds after the 7 days of additional lasix 20 mg PO, Advise she take lasix 40 mg BID until weight below 152 pounds. Advise going forward, additional dose lasix 40 mg PO in PM if weight increases by 2 pounds in 24 hours or 4 pounds over course of week. Goal weight is 150-152 pounds. Weigh each morning after urinating.  I advise she increase the Uptravi by 200 mcg each dose (AM and PM) every 2 weeks to goal dose 2400 mcg BID (prior max dose, tolerated in past). Can we communicate this to [facility]? I can sign for prescriptions once teed up.

## 2021-02-13 NOTE — Telephone Encounter (Signed)
Routing to [employer]

## 2021-02-13 NOTE — Telephone Encounter (Signed)
Good  morning Kimberly Irwin, Mom's weight is 152 this morning and he wants her to completely stop the prednisone? Can we do the lab work on Monday?  [facility] an appt [facility] the [facility] at  2pm. Thank you.  Sorry to be such a pain. Kimberly Irwin  Dr. Silas Flood, are you ok with them waiting until Monday for the bloodwork?

## 2021-02-14 ENCOUNTER — Other Ambulatory Visit (HOSPITAL_COMMUNITY): Payer: Self-pay

## 2021-02-14 DIAGNOSIS — I110 Hypertensive heart disease with heart failure: Secondary | ICD-10-CM | POA: Diagnosis not present

## 2021-02-14 DIAGNOSIS — D860 Sarcoidosis of lung: Secondary | ICD-10-CM | POA: Diagnosis not present

## 2021-02-14 DIAGNOSIS — I5032 Chronic diastolic (congestive) heart failure: Secondary | ICD-10-CM | POA: Diagnosis not present

## 2021-02-14 DIAGNOSIS — J441 Chronic obstructive pulmonary disease with (acute) exacerbation: Secondary | ICD-10-CM | POA: Diagnosis not present

## 2021-02-14 DIAGNOSIS — J9611 Chronic respiratory failure with hypoxia: Secondary | ICD-10-CM | POA: Diagnosis not present

## 2021-02-14 DIAGNOSIS — I2510 Atherosclerotic heart disease of native coronary artery without angina pectoris: Secondary | ICD-10-CM | POA: Diagnosis not present

## 2021-02-14 NOTE — Telephone Encounter (Signed)
Reached out to pt's daughter and informed her that we would be mailing the patient portion of the PAP paperwork to her, she verbalized understanding.   She also inquired about bloodwork appt on Monday, stated that no decision had been made yet but that someone here at the clinic would reach out to her once the decision was made.

## 2021-02-15 NOTE — Progress Notes (Signed)
PCP: Dr. Charlett Blake Pulmonary: Dr. Vaughan Browner Cardiology: Dr. Aundra Dubin  75 y.o. with history of sarcoidosis and emphysema was referred by Dr Lake Bells for evaluation of pulmonary hypertension.  She has a diagnosis of sarcoidosis, but CT chest in 3/19 did not show marked parenchymal disease.  PFTs showed very low DLCO.  She had RHC done in 4/19 with moderate pulmonary hypertension and very high PVR.    She is now wearing up to 6L home oxygen by [place] at all times.  She does not smoke, says she quit > 20 years ago.    Echo in 6/19 showed EF 55-60%, mildly dilated RV with moderately decreased systolic function, PASP 83 mmHg, aortic sclerosis without significant stenosis.   She did not tolerate Opsumit.   Echo in 3/20 showed EF 55-60% with mild LVH, mild RV dilation with normal systolic function, PASP 31 mmHg.  Echo was done again in 9/20, showing EF 60-65%.  I reviewed the study, and think that the RV was mildly dilated with normal systolic function. PASP 51 mmHg. IVC normal.   RHC in 10/20 showed mild pulmonary HTN with mean PA pressure 28 mmHg and PVR 2.75 WU. She is now seeing a new pulmonologist at [facility].  FDG PET study was done, this showed no active sarcoidosis and MTX was stopped.  Trelegy was started.   High resolution CT chest in 2/21 showed moderate emphysema, no evidence for sarcoidosis.  CTA chest in 3/21 showed no PE.  I asked pulmonary to review her studies for evidence of PVOD, they did not think that there was evidence for PVOD.    She returned for f/u of pulmonary HTN on 5/21.  Wears 6-7 L O2 at all times.  She is on Adcirca and selexipag now, really has not felt better on PH meds.  She is still very limited by both knee pain and dyspnea.  She is generally short of breath walking around her house but comfortable at rest.  This has been a long-term pattern. She tried stopping prednisone but got worse, now back on prednisone.  No chest pain, no lightheadedness, no syncope.   Has been on Uptravi and  tadalafil.  Recently decreased Uptravi.   Presented 6/22 with acute on chronic respiratory failure.  Also with ?UIP.  She was started on Bipap in the ER.  Symptoms are combination of worsening pulmonary hypertension and COPD exacerbation.  Pulmonary following.  She underwent RHC which showed moderate pulmonary arterial hypertension, PVR 4.7 WU, preserved CO, and low/normal filling pressures. [name] was increased to 1600 mcg twice daily.  Over the course of several days patient's oxygen was weaned down from 13 L high flow nasal cannula to 8 L high flow nasal cannula, which is around her baseline.  Once she was medically stable, she was initially offered skilled nursing facility but patient declined, therefore she was discharged with home health.  She was seen in the ED 02/08/21 with SOB and cough. Her BNP was elevated at 547 and she was instructed to take an additional 20 mg lasix x 7 days. Her weight increased after being evaluated in the ED and she notified her pulmonologist. She was advised to take lasix 40 mg bid until her weight dropped below 152 lbs.  Today she returns for HF follow up. She was last seen in HF clinic 5/21. Overall feeling fair. She fell this morning when she grabbed onto her walker, the wheels were locked, her knees gave out, and she slid onto the floor. SOB with  any activity, but able to get around the house with her walker. Some intermittent dizziness. Denies increasing SOB, CP, edema, or PND/Orthopnea. Appetite ok. No fever or chills. Weight at home 154 pounds. Taking all medications. She is unable to stand to weigh today.  6 minute walk (5/19): 122 m 6 minute walk (9/19):  60 m (only walked for about 1 minute).  6 minute walk (10/20): 109 m 6 minute walk (7/22): she said her knee pain would not allow her to walk today.  ECG (personally reviewed): NSR, nonspecific T wave abnormality  Labs (3/19): LDL 53 Labs (4/19): hgb 8.5, K 4.8, creatinine 1.06 Labs (5/19): anti-SCL70  negative, ANA negative, RF negative, ANCA negative, HIV negative, anti-centromere negative.  Labs (6/19): K 4, creatinine 1.03 Labs (9/19): K 4.6, creatinine 1.18 Labs (10/19): TSH normal, K 4.1, creatinine 1. Labs (2/20): LDL 103, K 4.2, creatinine 0.99 Labs (9/20): K 3.9, creatinine 1.06, BNP 137 Labs (10/20): K 4.6, creatinine 1.05  Labs (3/21): K 3.9, creatinine 1.01, pro-BNP 151 Labs (6/22): K 3.1, creatinine 1.37  PMH: 1. Sarcoidosis: On home oxygen.  - PFTs (3/19): FEV1 84%, FVC 84%, ratio 71%, TLC 75%, DLCO 25% - CT (3/19) with mild fibrotic changes in the lung bases.  - FDG PET (10/20): This was not suggestive of active sarcoidosis.  - High resolution CT chest (2/21): No evidence for sarcoidosis, moderate emphysema.  2. Emphysema: Moderate emphysema on CT chest 3/19 and HRCT in 2/21.  3. CAD: Calcification of coronaries on CT chest in 3/19.  - Cardiolite in 1/19: EF 65%, no ischemia/infarction.  4. HTN 5. Hyperlipidemia 6. Hypothyroidism 7. Anemia 8. Pulmonary hypertension:  - Echo (9/17): EF 60-65%, PASP 44 mmHg.  - RHC (4/19): mean RA 2, PA 66/16 mean 34, mean PCWP 3, PVR 8.3 WU, CI 2.04 - Autoimmune serologies negative.  - V/Q scan did not show evidence for chronic PE.  - Echo (6/19): EF 55-60%, mildly dilated RV with moderately decreased systolic function, PASP 83 mmHg, aortic sclerosis without significant stenosis.  - Echo (3/20): EF 55-60%, mild LVH, mild RV dilation with normal systolic function, PASP 31 mmHg.  - Echo (9/20): EF 60-65%, mild RV dilation with normal systolic function, PASP 51 mmHg, normal IVC.  - RHC (10/20): mean RA 4, PA 50/14 mean 28, mean PCWP 12, CI 3.15, PVR 2.75 WU - Echo (6/22): EF 60-65%, Grade I DD, RV mod enlarged, RVSP 51 mmHg - RHC (6/22): mean RA 2, PA 53/19 (30), mean PCWP 7, CI 2.64, CO 4.85, PVR 4.7 WU 9. OA: Knees.   Review of systems complete and found to be negative unless listed in HPI.   Social History   Socioeconomic  History   Marital status: Widowed    Spouse name: Not on file   Number of children: 2   Years of education: Not on file   Highest education level: Not on file  Occupational History   Not on file  Tobacco Use   Smoking status: Former    Packs/day: 0.50    Years: 32.00    Pack years: 16.00    Types: Cigarettes    Quit date: 07/22/1994    Years since quitting: 26.5   Smokeless tobacco: Never  Substance and Sexual Activity   Alcohol use: No    Alcohol/week: 0.0 standard drinks   Drug use: No   Sexual activity: Not Currently  Other Topics Concern   Not on file  Social History Narrative   Married 40 years  and has 2 children (2 daughters)   Alcohol Use - no   Former Smoker - she quit 10 years ago, started when she was 89 and has had varying level of use of tobacco products from 1/2 pack to 3 packs per day.   Social Determinants of Health   Financial Resource Strain: Not on file  Food Insecurity: Not on file  Transportation Needs: Not on file  Physical Activity: Not on file  Stress: Not on file  Social Connections: Not on file  Intimate Partner Violence: Not on file   Family History  Problem Relation Age of Onset   Cancer Mother 71       Breast   Heart disease Mother    Hypertension Mother    Hyperlipidemia Mother    Heart attack Mother    Asthma Maternal Grandmother     Current Outpatient Medications  Medication Sig Dispense Refill   acetaminophen (TYLENOL) 500 MG tablet Take 1,000 mg by mouth every 6 (six) hours as needed for moderate pain.     albuterol (PROVENTIL) (2.5 MG/3ML) 0.083% nebulizer solution Take 2.5 mg by nebulization every 6 (six) hours as needed for wheezing or shortness of breath.     aspirin EC 81 MG tablet Take 81 mg by mouth at bedtime.      atenolol (TENORMIN) 25 MG tablet TAKE 1 TABLET (25 MG TOTAL) BY MOUTH AT BEDTIME. TAKE THE 50 MG ATENOLOL IN AM AND THE 25 MG IN PM 90 tablet 3   atenolol (TENORMIN) 50 MG tablet TAKE 1 TABLET BY MOUTH EVERY  DAY 90 tablet 1   BESIVANCE 0.6 % SUSP Place 1 drop into the left eye See admin instructions. 1 drop into left eye QID for 2 days after eye injection  12   calcium carbonate (OSCAL) 1500 (600 Ca) MG TABS tablet Take 1,500 mg by mouth daily with breakfast.      Cholecalciferol (VITAMIN D) 2000 UNITS tablet Take 2,000 Units by mouth every evening.      CINNAMON PO Take 2,000 mg by mouth daily.     citalopram (CELEXA) 10 MG tablet Take 1 tablet (10 mg total) by mouth daily. 30 tablet 11   Coenzyme Q10 200 MG capsule Take 200 mg by mouth at bedtime.      Fexofenadine HCl (ALLEGRA ALLERGY PO) Take 1 tablet by mouth daily.     Fluticasone-Umeclidin-Vilant (TRELEGY ELLIPTA) 100-62.5-25 MCG/INH AEPB Inhale 1 Inhaler into the lungs daily. 28 each 0   furosemide (LASIX) 40 MG tablet Take 1 tablet (40 mg total) by mouth daily. 30 tablet 0   gabapentin (NEURONTIN) 300 MG capsule Take 1 capsule (300 mg total) by mouth at bedtime. 30 capsule 5   hydrochlorothiazide (MICROZIDE) 12.5 MG capsule TAKE 1 CAPSULE BY MOUTH EVERY DAY 90 capsule 3   ketoconazole (NIZORAL) 2 % shampoo Apply 1 application topically daily as needed for irritation (scalp).     levocetirizine (XYZAL) 5 MG tablet TAKE 1 TABLET BY MOUTH EVERY DAY IN THE EVENING 90 tablet 1   levothyroxine (SYNTHROID) 112 MCG tablet TAKE 1 TABLET BY MOUTH DAILY BEFORE BREAKFAST. 30 tablet 0   Omega-3 Fatty Acids (FISH OIL) 1200 MG CAPS Take 1,200 mg by mouth daily.     omeprazole (PRILOSEC) 40 MG capsule TAKE 1 CAPSULE BY MOUTH EVERY DAY 30 capsule 0   potassium chloride (KLOR-CON) 10 MEQ tablet Take 40 mEq by mouth daily. Take 40 mEq bid x 3 days, then 40 mEq daily thereafter.  rosuvastatin (CRESTOR) 20 MG tablet TAKE 1 TABLET BY MOUTH EVERY DAY (Patient taking differently: Take 20 mg by mouth daily.) 90 tablet 1   Selexipag 1600 MCG TABS Take 1 tablet (1,600 mcg total) by mouth 2 (two) times daily. 60 tablet    Spacer/Aero-Holding Chambers (AEROCHAMBER  PLUS WITH MASK) inhaler Use as instructed 1 each 2   tadalafil, PAH, (ADCIRCA) 20 MG tablet TAKE 2 TABLETS BY MOUTH EVERY DAY 60 tablet 11   traMADol (ULTRAM) 50 MG tablet Take 1 tablet (50 mg total) by mouth every 6 (six) hours as needed for moderate pain or severe pain. 20 tablet 0   vitamin B-12 (CYANOCOBALAMIN) 1000 MCG tablet Take 1,000 mcg by mouth daily.     No current facility-administered medications for this encounter.   There were no vitals taken for this visit.  Wt Readings from Last 3 Encounters:  02/08/21 68 kg (150 lb)  01/24/21 68 kg (150 lb)  01/24/21 69.4 kg (153 lb)   General:  NAD. No resp difficulty, chronically-ill appearing, arrived in [facility] on oxygen HEENT: Normal Neck: Supple. No JVD. Carotids 2+ bilat; no bruits. No lymphadenopathy or thryomegaly appreciated. Cor: PMI nondisplaced. Regular rate & rhythm. No rubs, gallops or murmurs. Lungs: Distant breath sounds. Abdomen: Soft, nontender, nondistended. No hepatosplenomegaly. No bruits or masses. Good bowel sounds. Extremities: No cyanosis, clubbing, rash, edema Neuro: Alert & oriented x 3, cranial nerves grossly intact. Moves all 4 extremities w/o difficulty. Affect pleasant.  Assessment/Plan: 1. Pulmonary hypertension: Cath 4/19 with moderate PAH, very high PVR. Right and left heart filling pressures were low.  PFTs in 3/19 showed mild restriction with very low DLCO consistent with pulmonary vascular disease.  V/Q scan with no evidence for chronic PE and autoimmune serologies were negative. High resolution CT in 3/19 showed mild fibrotic changes at the lung bases and a degree of emphysema.  I do not think CT findings can fully explain her PH.  Evaluation so far is not suggestive of sarcoidosis as a cause of PH.  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  Echo 6/19 showed normal LV EF but mildly dilated/moderately dysfunctional RV with severe pulmonary hypertension.  As parenchymal  disease did not appear particularly bad on initial CT, we have been trying her on pulmonary vasodilators. She did not tolerate Opsumit.  Now on selexipag and Adcirca but without much symptomatic improvement. However, RHC in 10/20 showed lower PA pressure with PVR 2.75 WU.  Repeat RHC (6/22) showed moderate pulmonary arterial hypertension, PVR 4.7 WU, preserved CO and normal/low filling pressures. She is not volume overloaded on exam. She is still very limited with NYHA class IIIb-IV symptoms.  - Continue Adcirca 40 mg daily and selexipag 1600 mcg bid. Discussed in the past stopping them as she feels no better or worse with them.  Given the fact that her PA pressure improved and might worsen if the meds were stopped, we have opted to continue them.  - She did not tolerate Opsumit.  - Given lack of improvement, there was concern for PVOD.  However, studies were reviewed by Dr. Claris Gladden pulmonary colleagues and they do not think that PVOD is likely.   2. Chronic hypoxemic respiratory failure: She has moderate emphysema by imaging.  She is on home oxygen.  Pulmonary hypertension also likely plays a role in low oxygen saturation.  FDG PET did not appear to show active pulmonary sarcoidosis so MTX has been stopped.  She breathes better  on prednisone, this could be seen with either COPD/emphysema or sarcoidosis. Suspect that the main cause of her symptoms  is lung parenchymal disease.  After extensive workup, it appears that the parenchymal disease is primarily emphysema.  - Continue home oxygen.   - Continue prednisone.  - Followup with Dr. Silas Flood.  3. HTN: BP controlled.  - Continue HCTZ 12.5 mg daily and atenolol.  - BMET today.  Followup in 2 months with Dr. Loistine Simas, [place] 02/18/2021

## 2021-02-16 DIAGNOSIS — I2510 Atherosclerotic heart disease of native coronary artery without angina pectoris: Secondary | ICD-10-CM | POA: Diagnosis not present

## 2021-02-16 DIAGNOSIS — Z9181 History of falling: Secondary | ICD-10-CM | POA: Diagnosis not present

## 2021-02-16 DIAGNOSIS — Z96641 Presence of right artificial hip joint: Secondary | ICD-10-CM | POA: Diagnosis not present

## 2021-02-16 DIAGNOSIS — D860 Sarcoidosis of lung: Secondary | ICD-10-CM | POA: Diagnosis not present

## 2021-02-16 DIAGNOSIS — M545 Low back pain, unspecified: Secondary | ICD-10-CM | POA: Diagnosis not present

## 2021-02-16 DIAGNOSIS — I5032 Chronic diastolic (congestive) heart failure: Secondary | ICD-10-CM | POA: Diagnosis not present

## 2021-02-16 DIAGNOSIS — E785 Hyperlipidemia, unspecified: Secondary | ICD-10-CM | POA: Diagnosis not present

## 2021-02-16 DIAGNOSIS — Z87891 Personal history of nicotine dependence: Secondary | ICD-10-CM | POA: Diagnosis not present

## 2021-02-16 DIAGNOSIS — E039 Hypothyroidism, unspecified: Secondary | ICD-10-CM | POA: Diagnosis not present

## 2021-02-16 DIAGNOSIS — Z7952 Long term (current) use of systemic steroids: Secondary | ICD-10-CM | POA: Diagnosis not present

## 2021-02-16 DIAGNOSIS — G8929 Other chronic pain: Secondary | ICD-10-CM | POA: Diagnosis not present

## 2021-02-16 DIAGNOSIS — I272 Pulmonary hypertension, unspecified: Secondary | ICD-10-CM | POA: Diagnosis not present

## 2021-02-16 DIAGNOSIS — J441 Chronic obstructive pulmonary disease with (acute) exacerbation: Secondary | ICD-10-CM | POA: Diagnosis not present

## 2021-02-16 DIAGNOSIS — J9611 Chronic respiratory failure with hypoxia: Secondary | ICD-10-CM | POA: Diagnosis not present

## 2021-02-16 DIAGNOSIS — I5033 Acute on chronic diastolic (congestive) heart failure: Secondary | ICD-10-CM | POA: Diagnosis not present

## 2021-02-16 DIAGNOSIS — I110 Hypertensive heart disease with heart failure: Secondary | ICD-10-CM | POA: Diagnosis not present

## 2021-02-18 ENCOUNTER — Telehealth (HOSPITAL_COMMUNITY): Payer: Self-pay | Admitting: Family Medicine

## 2021-02-18 ENCOUNTER — Encounter (HOSPITAL_COMMUNITY): Payer: Self-pay

## 2021-02-18 ENCOUNTER — Other Ambulatory Visit: Payer: Self-pay

## 2021-02-18 ENCOUNTER — Ambulatory Visit (HOSPITAL_COMMUNITY)
Admission: RE | Admit: 2021-02-18 | Discharge: 2021-02-18 | Disposition: A | Discharge status: Home / Self Care | Payer: Medicare Other | Source: Ambulatory Visit | Attending: Family Medicine | Admitting: Family Medicine

## 2021-02-18 ENCOUNTER — Other Ambulatory Visit (HOSPITAL_COMMUNITY): Payer: Self-pay | Admitting: Family Medicine

## 2021-02-18 DIAGNOSIS — Z8249 Family history of ischemic heart disease and other diseases of the circulatory system: Secondary | ICD-10-CM | POA: Diagnosis not present

## 2021-02-18 DIAGNOSIS — I5032 Chronic diastolic (congestive) heart failure: Secondary | ICD-10-CM

## 2021-02-18 DIAGNOSIS — Z87891 Personal history of nicotine dependence: Secondary | ICD-10-CM | POA: Insufficient documentation

## 2021-02-18 DIAGNOSIS — J9611 Chronic respiratory failure with hypoxia: Secondary | ICD-10-CM | POA: Insufficient documentation

## 2021-02-18 DIAGNOSIS — Z9981 Dependence on supplemental oxygen: Secondary | ICD-10-CM | POA: Diagnosis not present

## 2021-02-18 DIAGNOSIS — I2721 Secondary pulmonary arterial hypertension: Secondary | ICD-10-CM | POA: Diagnosis not present

## 2021-02-18 DIAGNOSIS — I272 Pulmonary hypertension, unspecified: Secondary | ICD-10-CM

## 2021-02-18 DIAGNOSIS — Z7982 Long term (current) use of aspirin: Secondary | ICD-10-CM | POA: Insufficient documentation

## 2021-02-18 DIAGNOSIS — I10 Essential (primary) hypertension: Secondary | ICD-10-CM

## 2021-02-18 DIAGNOSIS — Z79899 Other long term (current) drug therapy: Secondary | ICD-10-CM | POA: Insufficient documentation

## 2021-02-18 DIAGNOSIS — J439 Emphysema, unspecified: Secondary | ICD-10-CM | POA: Diagnosis not present

## 2021-02-18 DIAGNOSIS — E876 Hypokalemia: Secondary | ICD-10-CM

## 2021-02-18 LAB — BASIC METABOLIC PANEL
Anion gap: 12 (ref 5–15)
BUN: 38 mg/dL — ABNORMAL HIGH (ref 8–23)
CO2: 28 mmol/L (ref 22–32)
Calcium: 8.7 mg/dL — ABNORMAL LOW (ref 8.9–10.3)
Chloride: 97 mmol/L — ABNORMAL LOW (ref 98–111)
Creatinine, Ser: 2.38 mg/dL — ABNORMAL HIGH (ref 0.44–1.00)
GFR, Estimated: 21 mL/min — ABNORMAL LOW (ref >60)
Glucose, Bld: 174 mg/dL — ABNORMAL HIGH (ref 70–99)
Potassium: 2.9 mmol/L — ABNORMAL LOW (ref 3.5–5.1)
Sodium: 137 mmol/L (ref 135–145)

## 2021-02-18 LAB — MAGNESIUM: Magnesium: 1.9 mg/dL (ref 1.7–2.4)

## 2021-02-18 LAB — BRAIN NATRIURETIC PEPTIDE: B Natriuretic Peptide: 137.5 pg/mL — ABNORMAL HIGH (ref 0.0–100.0)

## 2021-02-18 NOTE — Telephone Encounter (Signed)
Unable to reach patient or leave message. Called patient's daughter, [name]. Informed her of patient's low potassium and elevated kidney function. She will increase KCl to 40 mEq bid x 3 days then down to 40 mEq KCl daily. She will hold HCTZ and lasix x 3 days, then resume home dose of hctz and reduce lasix to 40 mg daily. Repeat labs scheduled for Friday 02/22/21 at 10:30. Daughter verbalized understanding of medication changes. Patient's med list updated to reflect changes.  Allena Katz, FNP-BC

## 2021-02-18 NOTE — Telephone Encounter (Signed)
Dr. Silas Flood please advise on the following My Chart message:   Hi, Dr. Regan Lemming did not send in another RX after April.  When she went into the hospital the first of June, Dr. Reesa Chew put her on it and the Incruse.  Something about they had to do two separate ones instead of a single?  It did not make a lot of sense to me but she has been using both since she came home. [name]  Thank you

## 2021-02-18 NOTE — Progress Notes (Signed)
Repeat labs placed for lab appt 02/22/21 at 10:30

## 2021-02-18 NOTE — Patient Instructions (Signed)
It was great to see you today! No medication changes are needed at this time.   Labs today We will only contact you if something comes back abnormal or we need to make some changes. Otherwise no news is good news!  Your physician recommends that you schedule a follow-up appointment in: 2 months with Dr Aundra Dubin   Do the following things EVERYDAY: Weigh yourself in the morning before breakfast. Write it down and keep it in a log. Take your medicines as prescribed Eat low salt foods--Limit salt (sodium) to 2000 mg per day.  Stay as active as you can everyday Limit all fluids for the day to less than 2 liters   milAt the [facility], you and your health needs are our priority. As part of our continuing mission to provide you with exceptional heart care, we have created designated Provider Care Teams. These Care Teams include your primary Cardiologist (physician) and Advanced Practice Providers (APPs- Physician Assistants and Nurse Practitioners) who all work together to provide you with the care you need, when you need it.   You may see any of the following providers on your designated Care Team at your next follow up: Dr Glori Bickers Dr [name] Dr Patrice Paradise, NP Lyda Jester, [place] Ginnie Smart Audry Riles, PharmD   Please be sure to bring in all your medications bottles to every appointment.

## 2021-02-19 ENCOUNTER — Encounter (HOSPITAL_COMMUNITY): Payer: Self-pay

## 2021-02-19 DIAGNOSIS — D860 Sarcoidosis of lung: Secondary | ICD-10-CM | POA: Diagnosis not present

## 2021-02-19 DIAGNOSIS — J441 Chronic obstructive pulmonary disease with (acute) exacerbation: Secondary | ICD-10-CM | POA: Diagnosis not present

## 2021-02-19 DIAGNOSIS — I5032 Chronic diastolic (congestive) heart failure: Secondary | ICD-10-CM | POA: Diagnosis not present

## 2021-02-19 DIAGNOSIS — J9611 Chronic respiratory failure with hypoxia: Secondary | ICD-10-CM | POA: Diagnosis not present

## 2021-02-19 DIAGNOSIS — I2510 Atherosclerotic heart disease of native coronary artery without angina pectoris: Secondary | ICD-10-CM | POA: Diagnosis not present

## 2021-02-19 DIAGNOSIS — I110 Hypertensive heart disease with heart failure: Secondary | ICD-10-CM | POA: Diagnosis not present

## 2021-02-20 NOTE — Telephone Encounter (Signed)
Dr. Silas Flood, Please see patient comment.  Thank you.

## 2021-02-22 ENCOUNTER — Ambulatory Visit (HOSPITAL_COMMUNITY)
Admission: RE | Admit: 2021-02-22 | Discharge: 2021-02-22 | Disposition: A | Discharge status: Home / Self Care | Payer: Medicare Other | Source: Ambulatory Visit | Attending: Internal Medicine | Admitting: Internal Medicine

## 2021-02-22 ENCOUNTER — Other Ambulatory Visit: Payer: Self-pay

## 2021-02-22 DIAGNOSIS — E876 Hypokalemia: Secondary | ICD-10-CM | POA: Diagnosis not present

## 2021-02-22 LAB — BASIC METABOLIC PANEL
Anion gap: 8 (ref 5–15)
BUN: 20 mg/dL (ref 8–23)
CO2: 24 mmol/L (ref 22–32)
Calcium: 9.5 mg/dL (ref 8.9–10.3)
Chloride: 107 mmol/L (ref 98–111)
Creatinine, Ser: 1.41 mg/dL — ABNORMAL HIGH (ref 0.44–1.00)
GFR, Estimated: 39 mL/min — ABNORMAL LOW (ref >60)
Glucose, Bld: 105 mg/dL — ABNORMAL HIGH (ref 70–99)
Potassium: 5.2 mmol/L — ABNORMAL HIGH (ref 3.5–5.1)
Sodium: 139 mmol/L (ref 135–145)

## 2021-02-24 ENCOUNTER — Other Ambulatory Visit: Payer: Self-pay | Admitting: Family Medicine

## 2021-02-24 DIAGNOSIS — K219 Gastro-esophageal reflux disease without esophagitis: Secondary | ICD-10-CM

## 2021-02-26 ENCOUNTER — Telehealth: Payer: Self-pay | Admitting: Pharmacist

## 2021-02-26 NOTE — Telephone Encounter (Signed)
Dr. Silas Flood recommendations give to pt via My Chart. Nothing further needed at this time.

## 2021-02-26 NOTE — Telephone Encounter (Signed)
Confirmed with JJPAF that patient does not need to submit Uptravi paperwork as she transitions to Dr. Silas Flood for [name] management. Only update needed is that Dr. Silas Flood submit rx and Statement of Medical Necessity form. Signed by Dr.Hunsucker today with rx for Uptravi:  1600 mcg twice x 14 days then increase by 200 mcg twice daily every 14 days to max dose of 2400 mcg twice daily  Rep confirmed that patient is active at [facility]  Fax: [phone]  Knox Saliva, PharmD, MPH, BCPS Clinical Pharmacist (Rheumatology and Pulmonology)

## 2021-02-26 NOTE — Telephone Encounter (Signed)
Dr. Silas Flood please advise on the following My Chart message:   Good afternoon.  Mom has been doing ok and not been coughing until yesterday.  We have been watching her weight and for the past 5 days it has been 155, 154, 155, 153, 154.  She said she feels like she did before she went to the hospital on 7/8.  Based on her last blood test results, would it hurt to add in an additional 40m Lasix every other day or even every day for a few days? Thank you, BHassan Rowan Thank you

## 2021-02-27 ENCOUNTER — Telehealth: Payer: Self-pay | Admitting: Pulmonary Disease

## 2021-02-27 ENCOUNTER — Telehealth (HOSPITAL_COMMUNITY): Payer: Self-pay

## 2021-02-27 DIAGNOSIS — J441 Chronic obstructive pulmonary disease with (acute) exacerbation: Secondary | ICD-10-CM | POA: Diagnosis not present

## 2021-02-27 DIAGNOSIS — I110 Hypertensive heart disease with heart failure: Secondary | ICD-10-CM | POA: Diagnosis not present

## 2021-02-27 DIAGNOSIS — J9611 Chronic respiratory failure with hypoxia: Secondary | ICD-10-CM | POA: Diagnosis not present

## 2021-02-27 DIAGNOSIS — I5032 Chronic diastolic (congestive) heart failure: Secondary | ICD-10-CM

## 2021-02-27 DIAGNOSIS — D860 Sarcoidosis of lung: Secondary | ICD-10-CM | POA: Diagnosis not present

## 2021-02-27 DIAGNOSIS — I2510 Atherosclerotic heart disease of native coronary artery without angina pectoris: Secondary | ICD-10-CM | POA: Diagnosis not present

## 2021-02-27 MED ORDER — POTASSIUM CHLORIDE ER 10 MEQ PO TBCR: 20.0000 meq | EXTENDED_RELEASE_TABLET | Freq: Every day | ORAL | 3 refills | Status: AC

## 2021-02-27 MED ORDER — TRELEGY ELLIPTA 100-62.5-25 MCG/INH IN AEPB: 1.0000 | INHALATION_SPRAY | Freq: Every day | RESPIRATORY_TRACT | 6 refills | Status: AC

## 2021-02-27 NOTE — Telephone Encounter (Signed)
Patient advised and verbalized understanding,lab appointment scheduled,lab orders entered, med list updated to reflect changes.  Orders Placed This Encounter  Procedures   Basic metabolic panel    Standing Status:   Future    Standing Expiration Date:   02/27/2022    Order Specific Question:   Release to patient    Answer:   Immediate   Meds ordered this encounter  Medications   potassium chloride (KLOR-CON) 10 MEQ tablet    Sig: Take 2 tablets (20 mEq total) by mouth daily.    Dispense:  180 tablet    Refill:  3    Please cancel all previous orders for current medication. Change in dosage or pill size.

## 2021-02-27 NOTE — Telephone Encounter (Signed)
Note Confirmed with JJPAF that patient does not need to submit Uptravi paperwork as she transitions to Dr. Silas Flood for [name] management. Only update needed is that Dr. Silas Flood submit rx and Statement of Medical Necessity form. Signed by Dr.Hunsucker today with rx for Uptravi:   1600 mcg twice x 14 days then increase by 200 mcg twice daily every 14 days to max dose of 2400 mcg twice daily   Rep confirmed that patient is active at [facility]   Fax: [phone]   Knox Saliva, PharmD, MPH, BCPS Clinical Pharmacist (Rheumatology and Pulmonology)

## 2021-02-27 NOTE — Telephone Encounter (Signed)
I called and spoke with pts daughter and she stated that the trelegy needed to be sent to [facility].    She is aware that the [name] has been sent to [facility] yesterday.  Nothing further is needed.

## 2021-02-27 NOTE — Telephone Encounter (Signed)
-----  Message from Rafael Bihari, [place] sent at 02/22/2021  5:02 PM EDT ----- Kidney function much improved. Potassium slightly elevated. Please decrease daily K supp to 20 mEq and avoid high potassium foods. Will repeat BMET in 1-2 weeks.

## 2021-02-27 NOTE — Telephone Encounter (Signed)
[name] the pts daughter stated that [facility] contacted her and stated that Dr. Silas Flood must send in a refill to them for the medication; Selexipag.  Pharmacy; [facility]  Pls regard; [phone]  **Also, pt daughter stated that she was wanting to check on the status of Trelegy as well being filled.**

## 2021-02-28 ENCOUNTER — Telehealth: Payer: Self-pay | Admitting: Pulmonary Disease

## 2021-03-01 NOTE — Telephone Encounter (Signed)
Looked at pt's med list and we do not even have duoneb listed on pt's med list nor was it documented in pt's last OV for her to be on that med. Attempted to call [facility] but unable to reach. Left message for them to return call.

## 2021-03-04 NOTE — Telephone Encounter (Signed)
Called and spoke with Otila Kluver. She stated that [facility] needs a new order for the patient's DuoNeb solution and had it via [employer]. It was denied by Rodena Piety because she could not see the order for a nebulizer and medication.   I reviewed her chart and it appears the nebulizer and medication were ordered by Dr. Charlett Blake back in 2019. DuoNeb was not mentioned in the last few OV notes. I advised her that I would reach out to the patient to see if she was still using the DuoNeb, then reach out to Dr. Silas Flood to see if he is ok with [place] placing an order. She verbalized understanding. She stated that the order can be sent electronically to [facility] as long as the order did not have PRN in the instructions or comments.   Called and spoke with patient. She stated that she is using the Duoneb solution. She is not in need of a refill now and her nebulizer machine is currently working well.   Dr. Silas Flood, are you ok with [place] sending in a RX? The last RX I could find stated for her to use it every 6 hours.

## 2021-03-06 MED ORDER — IPRATROPIUM-ALBUTEROL 0.5-2.5 (3) MG/3ML IN SOLN: 3.0000 mL | Freq: Four times a day (QID) | RESPIRATORY_TRACT | 1 refills | Status: AC

## 2021-03-06 NOTE — Telephone Encounter (Signed)
Order for Duonebs has been placed.

## 2021-03-08 ENCOUNTER — Telehealth (HOSPITAL_COMMUNITY): Payer: Self-pay | Admitting: Family Medicine

## 2021-03-08 ENCOUNTER — Other Ambulatory Visit: Payer: Self-pay

## 2021-03-08 ENCOUNTER — Ambulatory Visit (HOSPITAL_COMMUNITY)
Admission: RE | Admit: 2021-03-08 | Discharge: 2021-03-08 | Disposition: A | Discharge status: Home / Self Care | Payer: Medicare Other | Source: Ambulatory Visit | Attending: Internal Medicine | Admitting: Internal Medicine

## 2021-03-08 ENCOUNTER — Telehealth (HOSPITAL_COMMUNITY): Payer: Self-pay | Admitting: *Deleted

## 2021-03-08 DIAGNOSIS — I5032 Chronic diastolic (congestive) heart failure: Secondary | ICD-10-CM | POA: Diagnosis not present

## 2021-03-08 LAB — BASIC METABOLIC PANEL
Anion gap: 11 (ref 5–15)
BUN: 36 mg/dL — ABNORMAL HIGH (ref 8–23)
CO2: 26 mmol/L (ref 22–32)
Calcium: 9.1 mg/dL (ref 8.9–10.3)
Chloride: 98 mmol/L (ref 98–111)
Creatinine, Ser: 2.27 mg/dL — ABNORMAL HIGH (ref 0.44–1.00)
GFR, Estimated: 22 mL/min — ABNORMAL LOW (ref >60)
Glucose, Bld: 127 mg/dL — ABNORMAL HIGH (ref 70–99)
Potassium: 2.7 mmol/L — CL (ref 3.5–5.1)
Sodium: 135 mmol/L (ref 135–145)

## 2021-03-08 NOTE — Telephone Encounter (Signed)
Spoke to patient's daughter regarding patient's low potassium. She will take 40 mEq bid of KCl and repeat labs on Monday 03/11/21 at 3 pm.

## 2021-03-08 NOTE — Telephone Encounter (Signed)
Main lab called with critical lab result.   K- 2.7   Provider notified

## 2021-03-09 DIAGNOSIS — I2510 Atherosclerotic heart disease of native coronary artery without angina pectoris: Secondary | ICD-10-CM | POA: Diagnosis not present

## 2021-03-09 DIAGNOSIS — D860 Sarcoidosis of lung: Secondary | ICD-10-CM | POA: Diagnosis not present

## 2021-03-09 DIAGNOSIS — I5032 Chronic diastolic (congestive) heart failure: Secondary | ICD-10-CM | POA: Diagnosis not present

## 2021-03-09 DIAGNOSIS — J9611 Chronic respiratory failure with hypoxia: Secondary | ICD-10-CM | POA: Diagnosis not present

## 2021-03-09 DIAGNOSIS — I110 Hypertensive heart disease with heart failure: Secondary | ICD-10-CM | POA: Diagnosis not present

## 2021-03-09 DIAGNOSIS — J441 Chronic obstructive pulmonary disease with (acute) exacerbation: Secondary | ICD-10-CM | POA: Diagnosis not present

## 2021-03-11 ENCOUNTER — Ambulatory Visit (HOSPITAL_COMMUNITY)
Admission: RE | Admit: 2021-03-11 | Discharge: 2021-03-11 | Disposition: A | Discharge status: Home / Self Care | Payer: Medicare Other | Source: Ambulatory Visit | Attending: Internal Medicine | Admitting: Internal Medicine

## 2021-03-11 ENCOUNTER — Other Ambulatory Visit (HOSPITAL_COMMUNITY): Payer: Self-pay | Admitting: Family Medicine

## 2021-03-11 ENCOUNTER — Other Ambulatory Visit: Payer: Self-pay

## 2021-03-11 DIAGNOSIS — I5032 Chronic diastolic (congestive) heart failure: Secondary | ICD-10-CM | POA: Insufficient documentation

## 2021-03-11 DIAGNOSIS — I10 Essential (primary) hypertension: Secondary | ICD-10-CM

## 2021-03-11 DIAGNOSIS — I2729 Other secondary pulmonary hypertension: Secondary | ICD-10-CM

## 2021-03-11 DIAGNOSIS — R0609 Other forms of dyspnea: Secondary | ICD-10-CM

## 2021-03-11 DIAGNOSIS — R3589 Other polyuria: Secondary | ICD-10-CM

## 2021-03-11 DIAGNOSIS — R0600 Dyspnea, unspecified: Secondary | ICD-10-CM

## 2021-03-11 LAB — BASIC METABOLIC PANEL
Anion gap: 11 (ref 5–15)
BUN: 34 mg/dL — ABNORMAL HIGH (ref 8–23)
CO2: 27 mmol/L (ref 22–32)
Calcium: 9.9 mg/dL (ref 8.9–10.3)
Chloride: 103 mmol/L (ref 98–111)
Creatinine, Ser: 1.81 mg/dL — ABNORMAL HIGH (ref 0.44–1.00)
GFR, Estimated: 29 mL/min — ABNORMAL LOW (ref >60)
Glucose, Bld: 100 mg/dL — ABNORMAL HIGH (ref 70–99)
Potassium: 4 mmol/L (ref 3.5–5.1)
Sodium: 141 mmol/L (ref 135–145)

## 2021-03-11 LAB — MAGNESIUM: Magnesium: 2 mg/dL (ref 1.7–2.4)

## 2021-03-11 LAB — BRAIN NATRIURETIC PEPTIDE: B Natriuretic Peptide: 506.4 pg/mL — ABNORMAL HIGH (ref 0.0–100.0)

## 2021-03-12 DIAGNOSIS — J9611 Chronic respiratory failure with hypoxia: Secondary | ICD-10-CM | POA: Diagnosis not present

## 2021-03-12 DIAGNOSIS — I2510 Atherosclerotic heart disease of native coronary artery without angina pectoris: Secondary | ICD-10-CM | POA: Diagnosis not present

## 2021-03-12 DIAGNOSIS — I110 Hypertensive heart disease with heart failure: Secondary | ICD-10-CM | POA: Diagnosis not present

## 2021-03-12 DIAGNOSIS — D860 Sarcoidosis of lung: Secondary | ICD-10-CM | POA: Diagnosis not present

## 2021-03-12 DIAGNOSIS — I5032 Chronic diastolic (congestive) heart failure: Secondary | ICD-10-CM | POA: Diagnosis not present

## 2021-03-12 DIAGNOSIS — J441 Chronic obstructive pulmonary disease with (acute) exacerbation: Secondary | ICD-10-CM | POA: Diagnosis not present

## 2021-03-20 ENCOUNTER — Other Ambulatory Visit: Payer: Self-pay | Admitting: Family Medicine

## 2021-03-20 ENCOUNTER — Encounter (INDEPENDENT_AMBULATORY_CARE_PROVIDER_SITE_OTHER): Payer: Medicare Other | Admitting: Ophthalmology

## 2021-03-20 DIAGNOSIS — K219 Gastro-esophageal reflux disease without esophagitis: Secondary | ICD-10-CM

## 2021-03-21 ENCOUNTER — Telehealth: Payer: Self-pay | Admitting: Pulmonary Disease

## 2021-03-21 MED ORDER — FUROSEMIDE 40 MG PO TABS: 40.0000 mg | ORAL_TABLET | Freq: Every day | ORAL | 2 refills | Status: AC

## 2021-03-21 NOTE — Telephone Encounter (Signed)
I have never had a pharmacy require a note for duonebs. Please fax the following:  Duonebs prescribed for history of COPD and bronchodilator response on pulmonary function test.

## 2021-03-21 NOTE — Telephone Encounter (Signed)
Called and spoke with daughter to let her know that I have sent refill on for lasix she expressed understanding. Nothing further needed at this time.

## 2021-03-21 NOTE — Telephone Encounter (Signed)
Called and spoke with daughter [name] per DPR who states that she is calling questioning the [facility]. She states patient has never received this med, but it shows up on mychart and they are texting her. Looks like it was ordered on 03/06/21 but she states they have not received it. [employer] and spoke with Corene Cornea who states that they have received the order but that he is not showing that it has been shipped as of yet. I was then transferred to pharmacy and spoke with [facility] who states that since she is a new patient to them they are needing office notes that list the medication on there. The Ov notes will need to faxed to: Fax number [phone].   Dr. Silas Flood is there a way to addend the last OV note mentioning Duoneb and that we are prescribing it?

## 2021-03-22 NOTE — Telephone Encounter (Signed)
Call made to pharmacy inc, per staff because she is a new patient the medication will have to be mentioned in the office note. I asked if we could just send a note of some sort stating what was mentioned per MH, unfortunately this will not suffice.    MH please advise. Thanks :)

## 2021-04-13 ENCOUNTER — Other Ambulatory Visit: Payer: Self-pay | Admitting: Family Medicine

## 2021-04-13 DIAGNOSIS — K219 Gastro-esophageal reflux disease without esophagitis: Secondary | ICD-10-CM

## 2021-04-21 ENCOUNTER — Encounter (HOSPITAL_COMMUNITY): Payer: Self-pay | Admitting: Internal Medicine

## 2021-04-21 ENCOUNTER — Inpatient Hospital Stay (HOSPITAL_COMMUNITY)
Admission: EM | Admit: 2021-04-21 | Discharge: 2021-04-27 | DRG: 291 | Disposition: A | Discharge status: Hospice | Payer: Medicare Other | Attending: Internal Medicine | Admitting: Internal Medicine

## 2021-04-21 ENCOUNTER — Other Ambulatory Visit: Payer: Self-pay

## 2021-04-21 ENCOUNTER — Emergency Department (HOSPITAL_COMMUNITY): Payer: Medicare Other

## 2021-04-21 DIAGNOSIS — J449 Chronic obstructive pulmonary disease, unspecified: Secondary | ICD-10-CM | POA: Diagnosis present

## 2021-04-21 DIAGNOSIS — I110 Hypertensive heart disease with heart failure: Secondary | ICD-10-CM | POA: Diagnosis present

## 2021-04-21 DIAGNOSIS — R570 Cardiogenic shock: Secondary | ICD-10-CM | POA: Diagnosis not present

## 2021-04-21 DIAGNOSIS — E890 Postprocedural hypothyroidism: Secondary | ICD-10-CM | POA: Diagnosis present

## 2021-04-21 DIAGNOSIS — E876 Hypokalemia: Secondary | ICD-10-CM | POA: Diagnosis present

## 2021-04-21 DIAGNOSIS — Z789 Other specified health status: Secondary | ICD-10-CM | POA: Diagnosis not present

## 2021-04-21 DIAGNOSIS — I517 Cardiomegaly: Secondary | ICD-10-CM | POA: Diagnosis not present

## 2021-04-21 DIAGNOSIS — K219 Gastro-esophageal reflux disease without esophagitis: Secondary | ICD-10-CM | POA: Diagnosis present

## 2021-04-21 DIAGNOSIS — D869 Sarcoidosis, unspecified: Secondary | ICD-10-CM | POA: Diagnosis present

## 2021-04-21 DIAGNOSIS — Z66 Do not resuscitate: Secondary | ICD-10-CM | POA: Diagnosis present

## 2021-04-21 DIAGNOSIS — G8929 Other chronic pain: Secondary | ICD-10-CM | POA: Diagnosis present

## 2021-04-21 DIAGNOSIS — R0902 Hypoxemia: Secondary | ICD-10-CM | POA: Diagnosis not present

## 2021-04-21 DIAGNOSIS — I272 Pulmonary hypertension, unspecified: Secondary | ICD-10-CM

## 2021-04-21 DIAGNOSIS — E669 Obesity, unspecified: Secondary | ICD-10-CM | POA: Diagnosis present

## 2021-04-21 DIAGNOSIS — Z711 Person with feared health complaint in whom no diagnosis is made: Secondary | ICD-10-CM | POA: Diagnosis not present

## 2021-04-21 DIAGNOSIS — Z981 Arthrodesis status: Secondary | ICD-10-CM

## 2021-04-21 DIAGNOSIS — R54 Age-related physical debility: Secondary | ICD-10-CM | POA: Diagnosis not present

## 2021-04-21 DIAGNOSIS — E785 Hyperlipidemia, unspecified: Secondary | ICD-10-CM | POA: Diagnosis present

## 2021-04-21 DIAGNOSIS — G250 Essential tremor: Secondary | ICD-10-CM | POA: Diagnosis present

## 2021-04-21 DIAGNOSIS — Z79899 Other long term (current) drug therapy: Secondary | ICD-10-CM

## 2021-04-21 DIAGNOSIS — E873 Alkalosis: Secondary | ICD-10-CM | POA: Diagnosis present

## 2021-04-21 DIAGNOSIS — Z87891 Personal history of nicotine dependence: Secondary | ICD-10-CM

## 2021-04-21 DIAGNOSIS — R609 Edema, unspecified: Secondary | ICD-10-CM | POA: Diagnosis not present

## 2021-04-21 DIAGNOSIS — G9341 Metabolic encephalopathy: Secondary | ICD-10-CM | POA: Diagnosis present

## 2021-04-21 DIAGNOSIS — Z79891 Long term (current) use of opiate analgesic: Secondary | ICD-10-CM

## 2021-04-21 DIAGNOSIS — I2723 Pulmonary hypertension due to lung diseases and hypoxia: Secondary | ICD-10-CM | POA: Diagnosis present

## 2021-04-21 DIAGNOSIS — J9621 Acute and chronic respiratory failure with hypoxia: Secondary | ICD-10-CM | POA: Diagnosis present

## 2021-04-21 DIAGNOSIS — Z8249 Family history of ischemic heart disease and other diseases of the circulatory system: Secondary | ICD-10-CM

## 2021-04-21 DIAGNOSIS — Z7951 Long term (current) use of inhaled steroids: Secondary | ICD-10-CM

## 2021-04-21 DIAGNOSIS — Z7189 Other specified counseling: Secondary | ICD-10-CM

## 2021-04-21 DIAGNOSIS — Z7982 Long term (current) use of aspirin: Secondary | ICD-10-CM

## 2021-04-21 DIAGNOSIS — Z515 Encounter for palliative care: Secondary | ICD-10-CM | POA: Diagnosis not present

## 2021-04-21 DIAGNOSIS — I5033 Acute on chronic diastolic (congestive) heart failure: Secondary | ICD-10-CM | POA: Diagnosis present

## 2021-04-21 DIAGNOSIS — R9431 Abnormal electrocardiogram [ECG] [EKG]: Secondary | ICD-10-CM | POA: Diagnosis not present

## 2021-04-21 DIAGNOSIS — I270 Primary pulmonary hypertension: Secondary | ICD-10-CM | POA: Diagnosis not present

## 2021-04-21 DIAGNOSIS — Z20822 Contact with and (suspected) exposure to covid-19: Secondary | ICD-10-CM | POA: Diagnosis present

## 2021-04-21 DIAGNOSIS — R64 Cachexia: Secondary | ICD-10-CM | POA: Diagnosis present

## 2021-04-21 DIAGNOSIS — I959 Hypotension, unspecified: Secondary | ICD-10-CM

## 2021-04-21 DIAGNOSIS — I9589 Other hypotension: Secondary | ICD-10-CM | POA: Diagnosis present

## 2021-04-21 DIAGNOSIS — Z7952 Long term (current) use of systemic steroids: Secondary | ICD-10-CM

## 2021-04-21 DIAGNOSIS — Z96643 Presence of artificial hip joint, bilateral: Secondary | ICD-10-CM | POA: Diagnosis present

## 2021-04-21 DIAGNOSIS — J9620 Acute and chronic respiratory failure, unspecified whether with hypoxia or hypercapnia: Secondary | ICD-10-CM | POA: Diagnosis not present

## 2021-04-21 DIAGNOSIS — I2510 Atherosclerotic heart disease of native coronary artery without angina pectoris: Secondary | ICD-10-CM | POA: Diagnosis present

## 2021-04-21 DIAGNOSIS — Z6824 Body mass index (BMI) 24.0-24.9, adult: Secondary | ICD-10-CM

## 2021-04-21 DIAGNOSIS — Z888 Allergy status to other drugs, medicaments and biological substances status: Secondary | ICD-10-CM

## 2021-04-21 DIAGNOSIS — J9622 Acute and chronic respiratory failure with hypercapnia: Secondary | ICD-10-CM | POA: Diagnosis not present

## 2021-04-21 DIAGNOSIS — E440 Moderate protein-calorie malnutrition: Secondary | ICD-10-CM | POA: Diagnosis present

## 2021-04-21 DIAGNOSIS — Z83438 Family history of other disorder of lipoprotein metabolism and other lipidemia: Secondary | ICD-10-CM

## 2021-04-21 DIAGNOSIS — Z9981 Dependence on supplemental oxygen: Secondary | ICD-10-CM

## 2021-04-21 DIAGNOSIS — Z79890 Hormone replacement therapy (postmenopausal): Secondary | ICD-10-CM

## 2021-04-21 DIAGNOSIS — Z825 Family history of asthma and other chronic lower respiratory diseases: Secondary | ICD-10-CM

## 2021-04-21 LAB — CREATININE, SERUM
Creatinine, Ser: 1.39 mg/dL — ABNORMAL HIGH (ref 0.44–1.00)
GFR, Estimated: 39 mL/min — ABNORMAL LOW (ref >60)

## 2021-04-21 LAB — CBC
HCT: 34.1 % — ABNORMAL LOW (ref 36.0–46.0)
Hemoglobin: 11.4 g/dL — ABNORMAL LOW (ref 12.0–15.0)
MCH: 30.2 pg (ref 26.0–34.0)
MCHC: 33.4 g/dL (ref 30.0–36.0)
MCV: 90.5 fL (ref 80.0–100.0)
Platelets: 183 10*3/uL (ref 150–400)
RBC: 3.77 MIL/uL — ABNORMAL LOW (ref 3.87–5.11)
RDW: 14.6 % (ref 11.5–15.5)
WBC: 9 10*3/uL (ref 4.0–10.5)
nRBC: 0 % (ref 0.0–0.2)

## 2021-04-21 LAB — CBC WITH DIFFERENTIAL/PLATELET
Abs Immature Granulocytes: 0.1 10*3/uL — ABNORMAL HIGH (ref 0.00–0.07)
Basophils Absolute: 0.1 10*3/uL (ref 0.0–0.1)
Basophils Relative: 1 %
Eosinophils Absolute: 0.1 10*3/uL (ref 0.0–0.5)
Eosinophils Relative: 1 %
HCT: 36.5 % (ref 36.0–46.0)
Hemoglobin: 12 g/dL (ref 12.0–15.0)
Immature Granulocytes: 1 %
Lymphocytes Relative: 9 %
Lymphs Abs: 1 10*3/uL (ref 0.7–4.0)
MCH: 29.9 pg (ref 26.0–34.0)
MCHC: 32.9 g/dL (ref 30.0–36.0)
MCV: 90.8 fL (ref 80.0–100.0)
Monocytes Absolute: 1 10*3/uL (ref 0.1–1.0)
Monocytes Relative: 9 %
Neutro Abs: 9.2 10*3/uL — ABNORMAL HIGH (ref 1.7–7.7)
Neutrophils Relative %: 79 %
Platelets: 210 10*3/uL (ref 150–400)
RBC: 4.02 MIL/uL (ref 3.87–5.11)
RDW: 14.6 % (ref 11.5–15.5)
WBC: 11.5 10*3/uL — ABNORMAL HIGH (ref 4.0–10.5)
nRBC: 0 % (ref 0.0–0.2)

## 2021-04-21 LAB — LACTIC ACID, PLASMA
Lactic Acid, Venous: 2.1 mmol/L (ref 0.5–1.9)
Lactic Acid, Venous: 1.5 mmol/L (ref 0.5–1.9)

## 2021-04-21 LAB — I-STAT ARTERIAL BLOOD GAS, ED
Acid-Base Excess: 4 mmol/L — ABNORMAL HIGH (ref 0.0–2.0)
Bicarbonate: 23.8 mmol/L (ref 20.0–28.0)
Calcium, Ion: 1.09 mmol/L — ABNORMAL LOW (ref 1.15–1.40)
HCT: 27 % — ABNORMAL LOW (ref 36.0–46.0)
Hemoglobin: 9.2 g/dL — ABNORMAL LOW (ref 12.0–15.0)
O2 Saturation: 100 %
Potassium: 3.2 mmol/L — ABNORMAL LOW (ref 3.5–5.1)
Sodium: 134 mmol/L — ABNORMAL LOW (ref 135–145)
TCO2: 24 mmol/L (ref 22–32)
pCO2 arterial: 21.9 mmHg — ABNORMAL LOW (ref 32.0–48.0)
pH, Arterial: 7.643 (ref 7.350–7.450)
pO2, Arterial: 212 mmHg — ABNORMAL HIGH (ref 83.0–108.0)

## 2021-04-21 LAB — I-STAT CHEM 8, ED
BUN: 21 mg/dL (ref 8–23)
Calcium, Ion: 1.18 mmol/L (ref 1.15–1.40)
Chloride: 103 mmol/L (ref 98–111)
Creatinine, Ser: 1.5 mg/dL — ABNORMAL HIGH (ref 0.44–1.00)
Glucose, Bld: 101 mg/dL — ABNORMAL HIGH (ref 70–99)
HCT: 35 % — ABNORMAL LOW (ref 36.0–46.0)
Hemoglobin: 11.9 g/dL — ABNORMAL LOW (ref 12.0–15.0)
Potassium: 3.3 mmol/L — ABNORMAL LOW (ref 3.5–5.1)
Sodium: 139 mmol/L (ref 135–145)
TCO2: 24 mmol/L (ref 22–32)

## 2021-04-21 LAB — RESPIRATORY PANEL BY PCR

## 2021-04-21 LAB — COMPREHENSIVE METABOLIC PANEL
ALT: 8 U/L (ref 0–44)
AST: 17 U/L (ref 15–41)
Albumin: 2.6 g/dL — ABNORMAL LOW (ref 3.5–5.0)
Alkaline Phosphatase: 40 U/L (ref 38–126)
Anion gap: 12 (ref 5–15)
BUN: 21 mg/dL (ref 8–23)
CO2: 21 mmol/L — ABNORMAL LOW (ref 22–32)
Calcium: 8.3 mg/dL — ABNORMAL LOW (ref 8.9–10.3)
Chloride: 104 mmol/L (ref 98–111)
Creatinine, Ser: 1.51 mg/dL — ABNORMAL HIGH (ref 0.44–1.00)
GFR, Estimated: 35 mL/min — ABNORMAL LOW (ref >60)
Glucose, Bld: 108 mg/dL — ABNORMAL HIGH (ref 70–99)
Potassium: 3.4 mmol/L — ABNORMAL LOW (ref 3.5–5.1)
Sodium: 137 mmol/L (ref 135–145)
Total Bilirubin: 0.6 mg/dL (ref 0.3–1.2)
Total Protein: 5.5 g/dL — ABNORMAL LOW (ref 6.5–8.1)

## 2021-04-21 LAB — URINALYSIS, ROUTINE W REFLEX MICROSCOPIC
Bilirubin Urine: NEGATIVE
Glucose, UA: NEGATIVE mg/dL
Hgb urine dipstick: NEGATIVE
Ketones, ur: NEGATIVE mg/dL
Leukocytes,Ua: NEGATIVE
Nitrite: NEGATIVE
Protein, ur: NEGATIVE mg/dL
Specific Gravity, Urine: 1.02 (ref 1.005–1.030)
pH: 5.5 (ref 5.0–8.0)

## 2021-04-21 LAB — RESP PANEL BY RT-PCR (FLU A&B, COVID) ARPGX2
Influenza A by PCR: NEGATIVE
Influenza B by PCR: NEGATIVE
SARS Coronavirus 2 by RT PCR: NEGATIVE

## 2021-04-21 LAB — GLUCOSE, CAPILLARY: Glucose-Capillary: 137 mg/dL — ABNORMAL HIGH (ref 70–99)

## 2021-04-21 LAB — BRAIN NATRIURETIC PEPTIDE: B Natriuretic Peptide: 403.8 pg/mL — ABNORMAL HIGH (ref 0.0–100.0)

## 2021-04-21 LAB — PROCALCITONIN: Procalcitonin: <0.1 ng/mL

## 2021-04-21 LAB — MRSA NEXT GEN BY PCR, NASAL: MRSA by PCR Next Gen: NOT DETECTED

## 2021-04-21 MED ORDER — CHLORHEXIDINE GLUCONATE CLOTH 2 % EX PADS
6.0000 | MEDICATED_PAD | Freq: Every day | CUTANEOUS | Status: DC
  Administered 2021-04-22 – 2021-04-27 (×6): 6 via TOPICAL

## 2021-04-21 MED ORDER — POTASSIUM CHLORIDE 10 MEQ/100ML IV SOLN
10.0000 meq | INTRAVENOUS | Status: AC
  Administered 2021-04-21 (×3): 10 meq via INTRAVENOUS
  Filled 2021-04-21 (×3): qty 100

## 2021-04-21 MED ORDER — SELEXIPAG 200 MCG PO TABS
200.0000 ug | ORAL_TABLET | Freq: Two times a day (BID) | ORAL | Status: DC
  Administered 2021-04-22 – 2021-04-27 (×12): 200 ug via ORAL
  Filled 2021-04-21 (×14): qty 1

## 2021-04-21 MED ORDER — NON FORMULARY: 1800.0000 ug | Freq: Two times a day (BID) | Status: DC

## 2021-04-21 MED ORDER — AMIODARONE LOAD VIA INFUSION: 150.0000 mg | Freq: Once | INTRAVENOUS | Status: DC

## 2021-04-21 MED ORDER — POLYETHYLENE GLYCOL 3350 17 G PO PACK: 17.0000 g | PACK | Freq: Every day | ORAL | Status: DC | PRN

## 2021-04-21 MED ORDER — AMIODARONE HCL IN DEXTROSE 360-4.14 MG/200ML-% IV SOLN: 30.0000 mg/h | INTRAVENOUS | Status: DC

## 2021-04-21 MED ORDER — ORAL CARE MOUTH RINSE
15.0000 mL | Freq: Two times a day (BID) | OROMUCOSAL | Status: DC
  Administered 2021-04-21 – 2021-04-27 (×10): 15 mL via OROMUCOSAL

## 2021-04-21 MED ORDER — IPRATROPIUM-ALBUTEROL 0.5-2.5 (3) MG/3ML IN SOLN
3.0000 mL | Freq: Four times a day (QID) | RESPIRATORY_TRACT | Status: AC | PRN
  Administered 2021-04-21: 3 mL via RESPIRATORY_TRACT
  Filled 2021-04-21: qty 3

## 2021-04-21 MED ORDER — DOCUSATE SODIUM 100 MG PO CAPS: 100.0000 mg | ORAL_CAPSULE | Freq: Two times a day (BID) | ORAL | Status: DC | PRN

## 2021-04-21 MED ORDER — ACETAMINOPHEN 650 MG RE SUPP
650.0000 mg | Freq: Once | RECTAL | Status: AC
  Administered 2021-04-21: 650 mg via RECTAL
  Filled 2021-04-21: qty 1

## 2021-04-21 MED ORDER — NOREPINEPHRINE 4 MG/250ML-% IV SOLN
0.0000 ug/min | INTRAVENOUS | Status: DC
  Administered 2021-04-21: 2 ug/min via INTRAVENOUS
  Filled 2021-04-21: qty 250

## 2021-04-21 MED ORDER — MAGNESIUM SULFATE 2 GM/50ML IV SOLN
2.0000 g | Freq: Once | INTRAVENOUS | Status: AC
  Administered 2021-04-21: 2 g via INTRAVENOUS
  Filled 2021-04-21: qty 50

## 2021-04-21 MED ORDER — SELEXIPAG 1600 MCG PO TABS
1600.0000 ug | ORAL_TABLET | Freq: Two times a day (BID) | ORAL | Status: DC
  Administered 2021-04-22 – 2021-04-27 (×12): 1600 ug via ORAL
  Filled 2021-04-21 (×14): qty 1

## 2021-04-21 MED ORDER — LACTATED RINGERS IV BOLUS
1000.0000 mL | Freq: Once | INTRAVENOUS | Status: AC
  Administered 2021-04-21: 1000 mL via INTRAVENOUS

## 2021-04-21 MED ORDER — AMIODARONE IV BOLUS ONLY 150 MG/100ML
150.0000 mg | Freq: Once | INTRAVENOUS | Status: AC
  Administered 2021-04-21: 150 mg via INTRAVENOUS
  Filled 2021-04-21: qty 100

## 2021-04-21 MED ORDER — METHYLPREDNISOLONE SODIUM SUCC 40 MG IJ SOLR
40.0000 mg | Freq: Once | INTRAMUSCULAR | Status: AC
  Administered 2021-04-22: 40 mg via INTRAVENOUS
  Filled 2021-04-21: qty 1

## 2021-04-21 MED ORDER — ENOXAPARIN SODIUM 40 MG/0.4ML IJ SOSY
40.0000 mg | PREFILLED_SYRINGE | INTRAMUSCULAR | Status: DC
  Administered 2021-04-21 – 2021-04-25 (×5): 40 mg via SUBCUTANEOUS
  Filled 2021-04-21 (×4): qty 0.4

## 2021-04-21 MED ORDER — AMIODARONE HCL IN DEXTROSE 360-4.14 MG/200ML-% IV SOLN
60.0000 mg/h | INTRAVENOUS | Status: AC
  Administered 2021-04-21: 60 mg/h via INTRAVENOUS

## 2021-04-21 MED ORDER — ENSURE ENLIVE PO LIQD: 237.0000 mL | Freq: Two times a day (BID) | ORAL | Status: DC

## 2021-04-21 MED ORDER — AMIODARONE LOAD VIA INFUSION
150.0000 mg | Freq: Once | INTRAVENOUS | Status: AC
  Administered 2021-04-21: 150 mg via INTRAVENOUS
  Filled 2021-04-21: qty 83.34

## 2021-04-21 MED ORDER — IPRATROPIUM-ALBUTEROL 0.5-2.5 (3) MG/3ML IN SOLN
3.0000 mL | Freq: Once | RESPIRATORY_TRACT | Status: AC
  Administered 2021-04-21: 3 mL via RESPIRATORY_TRACT
  Filled 2021-04-21: qty 3

## 2021-04-21 NOTE — ED Notes (Signed)
Per critical care MD, titrate levo with goal to discontinue and MAP goal of 60.

## 2021-04-21 NOTE — ED Provider Notes (Signed)
[facility] EMERGENCY DEPARTMENT Provider Note   CSN: [id number] Arrival date & time: 04/21/21  1255     History Chief Complaint  Patient presents with   Hypotension    Kimberly Irwin is a 75 y.o. female presenting for hypoxia.   Level V caveat due to acuity of condition.   Pt states she feels fine, and has no complaints.  She states her abdomen is different today.  She denies any fever or cough.  No pain.  Additional history obtained from patient's daughter who lives with patient and who is her legal guardian.  She states that today patient was weaker than normal.  She was unable to get up and have breakfast.  This is atypical for her.  She had an episode of full body shaking, did not lose consciousness and did not lose bowel or bladder control.  This lasted for about a minute.  Patient does have baseline tremors, but daughter states this was different.  She tried to check in oxygen after this, but was unable to get a reading.  Daughter states at that time patient's lips and fingers were blue.  Patient is on 8 L of oxygen at baseline via nasal cannula due to COPD.  Additional history obtained from chart review.  Patient with a history of COPD on oxygen, CAD, hypertension, hyperlipidemia, hypothyroidism, pulmonary hypertension, sarcoidosis, heart failure. She is a DNR/DNI and has a MOST form.   HPI     Past Medical History:  Diagnosis Date   Bursitis of left hip 10/26/2012   Chronic low back pain    Chronic respiratory failure with hypoxia (HCC) 10/21/2016   Coronary artery calcification 07/03/2016   DDD (degenerative disc disease)    L3-4 with facet arthropathy and stenosis   Degenerative spondylolisthesis    L4-5 grade 1 with stenosis   Emphysema lung (HCC)    GERD (gastroesophageal reflux disease)    Hiatal hernia    Hyperlipidemia    Hypertension    Hypothyroidism 10/28/2015   Pain in joint, lower leg 10/26/2012   left    Pneumonia    as a child  several times.    Pulmonary hypertension ([place])    Sarcoidosis    Tremors of nervous system     Patient Active Problem List   Diagnosis Date Noted   Chronic heart failure with preserved ejection fraction (HFpEF) ([place]) 01/02/2021   COPD with acute exacerbation ([place]) 01/02/2021   Prolonged QT interval 01/02/2021   Diarrhea 09/15/2018   Pulmonary HTN ([place]) 01/28/2018   Acute on chronic respiratory failure with hypoxia ([place]) 01/28/2018   Epistaxis 12/28/2017   Hypoxia, sleep related 10/21/2016   Cardiomegaly 07/13/2016   Coronary artery calcification 07/03/2016   COPD (chronic obstructive pulmonary disease) ([place]) 07/01/2016   Hypersomnia 07/01/2016   Chronic respiratory failure with hypoxia ([place]) 05/20/2016   Dermatitis 04/14/2016   SOB (shortness of breath) 04/03/2016   Hypothyroidism 10/28/2015   Lumbar compression fracture ([place]) 09/14/2014   Medicare annual wellness visit, subsequent 04/27/2014   Obesity    S/P left THA, AA 06/28/2013   Decreased visual acuity 02/19/2013   Hyperglycemia 04/04/2011   Chronic pain syndrome 01/20/2011   Depression 08/10/2008   GERD 04/26/2008   Sinusitis 08/02/2007   Lumbar disc disease 05/31/2007   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   BACK SURGERY  01/15/09   L3-4 and L4-5 posterior lumbar interbody fusion utilizing tanget interbody allograft wedge, Telamon interbody PEEk cage, and local autografting.   BACK SURGERY   01/15/09   L3, L4, and L5 posterolateral arthrodesis using segmental pedicle screw fixation and local autografting.   CARDIAC CATHETERIZATION     CARPAL TUNNEL RELEASE Bilateral    COLONOSCOPY     ESOPHAGOGASTRODUODENOSCOPY     EYE SURGERY     lens implant   JOINT REPLACEMENT  09/13/09   Right Hip, Dr. Alvan Dame   KYPHOPLASTY N/A 09/14/2014   Procedure: Lumbar Two Kyphoplasty/Vertebroplasty;  Surgeon: Charlie Pitter, MD;  Location: [facility];  Service: Neurosurgery;  Laterality: N/A;  Lumbar Two Kyphoplasty/Vertebroplasty   RIGHT HEART CATH N/A 11/19/2017   Procedure: RIGHT HEART CATH;  Surgeon: Larey Dresser, MD;  Location: [facility];  Service: Cardiovascular;  Laterality: N/A;   RIGHT HEART CATH N/A 05/27/2019   Procedure: RIGHT HEART CATH;  Surgeon: Larey Dresser, MD;  Location: [facility];  Service: Cardiovascular;  Laterality: N/A;   RIGHT HEART CATH N/A 01/07/2021   Procedure: RIGHT HEART CATH;  Surgeon: Larey Dresser, MD;  Location: [facility];  Service: Cardiovascular;  Laterality: N/A;   THYROIDECTOMY     TOTAL HIP ARTHROPLASTY Left 06/28/2013   Procedure: LEFT TOTAL  HIP ARTHROPLASTY ANTERIOR APPROACH;  Surgeon: Mauri Pole, MD;  Location: [facility];  Service: Orthopedics;  Laterality: Left;     OB History   No obstetric history on file.     Family History  Problem Relation Age of Onset   Cancer Mother 39       Breast   Heart disease Mother    Hypertension Mother    Hyperlipidemia Mother    Heart attack Mother    Asthma Maternal Grandmother     Social History   Tobacco Use   Smoking status: Former    Packs/day: 0.50    Years: 32.00    Pack years: 16.00    Types: Cigarettes    Quit date: 07/22/1994    Years since quitting: 26.7   Smokeless tobacco: Never  Substance Use Topics   Alcohol use: No    Alcohol/week: 0.0 standard drinks   Drug use: No    Home Medications Prior to Admission medications   Medication Sig Start Date End  Date Taking? Authorizing Provider  acetaminophen (TYLENOL) 500 MG tablet Take 1,000 mg by mouth every 6 (six) hours as needed for moderate pain.    [provider]  albuterol (PROVENTIL) (2.5 MG/3ML) 0.083% nebulizer solution Take 2.5 mg by nebulization every 6 (six) hours as needed for wheezing or shortness of breath.    [provider]  aspirin EC 81 MG tablet Take 81 mg by mouth at bedtime.     [provider]  atenolol (TENORMIN) 25 MG tablet TAKE 1 TABLET (25 MG TOTAL) BY MOUTH AT BEDTIME. TAKE THE 50 MG ATENOLOL IN AM AND THE 25 MG IN PM 08/15/20   Mosie Lukes, MD  atenolol (TENORMIN) 50 MG tablet TAKE  1 TABLET BY MOUTH EVERY DAY 02/01/21   Mosie Lukes, MD  BESIVANCE 0.6 % SUSP Place 1 drop into the left eye See admin instructions. 1 drop into left eye QID for 2 days after eye injection 12/18/14   [provider]  calcium carbonate (OSCAL) 1500 (600 Ca) MG TABS tablet Take 1,500 mg by mouth daily with breakfast.     [provider]  Cholecalciferol (VITAMIN D) 2000 UNITS tablet Take 2,000 Units by mouth every evening.     [provider]  CINNAMON PO Take 2,000 mg by mouth daily.    [provider]  citalopram (CELEXA) 10 MG tablet Take 1 tablet (10 mg total) by mouth daily. 09/14/20   Saguier, Percell Miller, PA-C  Coenzyme Q10 200 MG capsule Take 200 mg by mouth at bedtime.     [provider]  Fexofenadine HCl (ALLEGRA ALLERGY PO) Take 1 tablet by mouth daily.    [provider]  Fluticasone-Umeclidin-Vilant (TRELEGY ELLIPTA) 100-62.5-25 MCG/INH AEPB Inhale 1 Inhaler into the lungs daily. 02/27/21   Hunsucker, Bonna Gains, MD  furosemide (LASIX) 40 MG tablet Take 1 tablet (40 mg total) by mouth daily. 03/21/21   Hunsucker, Bonna Gains, MD  gabapentin (NEURONTIN) 300 MG capsule Take 1 capsule (300 mg total) by mouth at bedtime. 09/14/20   Saguier, Percell Miller, PA-C  hydrochlorothiazide (MICROZIDE) 12.5 MG capsule TAKE 1 CAPSULE  BY MOUTH EVERY DAY 02/01/21   Larey Dresser, MD  ipratropium-albuterol (DUONEB) 0.5-2.5 (3) MG/3ML SOLN Take 3 mLs by nebulization every 6 (six) hours. 03/06/21   Hunsucker, Bonna Gains, MD  ketoconazole (NIZORAL) 2 % shampoo Apply 1 application topically daily as needed for irritation (scalp).    [provider]  levocetirizine (XYZAL) 5 MG tablet TAKE 1 TABLET BY MOUTH EVERY DAY IN THE EVENING 12/06/20   Saguier, Percell Miller, PA-C  levothyroxine (SYNTHROID) 112 MCG tablet TAKE 1 TABLET BY MOUTH EVERY DAY BEFORE BREAKFAST 04/15/21   Mosie Lukes, MD  Omega-3 Fatty Acids (FISH OIL) 1200 MG CAPS Take 1,200 mg by mouth daily.    [provider]  omeprazole (PRILOSEC) 40 MG capsule TAKE 1 CAPSULE BY MOUTH EVERY DAY 04/15/21   Mosie Lukes, MD  potassium chloride (KLOR-CON) 10 MEQ tablet Take 2 tablets (20 mEq total) by mouth daily. 02/27/21   Milford, Maricela Bo, FNP  rosuvastatin (CRESTOR) 20 MG tablet TAKE 1 TABLET BY MOUTH EVERY DAY Patient taking differently: Take 20 mg by mouth daily. 02/01/21   Mosie Lukes, MD  Selexipag 1600 MCG TABS Take 1 tablet (1,600 mcg total) by mouth 2 (two) times daily. 01/14/21   Damita Lack, MD  Spacer/Aero-Holding Chambers (AEROCHAMBER PLUS WITH MASK) inhaler Use as instructed 01/28/18   Debbe Odea, MD  tadalafil, PAH, (ADCIRCA) 20 MG tablet TAKE 2 TABLETS BY MOUTH EVERY DAY 10/16/20   Larey Dresser, MD  traMADol (ULTRAM) 50 MG tablet Take 1 tablet (50 mg total) by mouth every 6 (six) hours as needed for moderate pain or severe pain. 01/14/21 01/14/22  Amin, Jeanella Flattery, MD  vitamin B-12 (CYANOCOBALAMIN) 1000 MCG tablet Take 1,000 mcg by mouth daily.    [provider]    Allergies    Lipitor [atorvastatin], Penicillins, Metronidazole, Pregabalin, and Simvastatin  Review of Systems   Review of Systems  Unable to perform ROS: Acuity of condition   Physical Exam Updated Vital Signs BP 105/76   Pulse 66   Temp 99.6 F (37.6 C)  (  Rectal)   Resp 18   SpO2 95%   Physical Exam Vitals and nursing note reviewed.  Constitutional:      General: She is in acute distress.     Appearance: She is obese. She is ill-appearing.  HENT:     Head: Normocephalic and atraumatic.  Eyes:     Conjunctiva/sclera: Conjunctivae normal.     Pupils: Pupils are equal, round, and reactive to light.  Cardiovascular:     Rate and Rhythm: Normal rate and regular rhythm.     Pulses: Normal pulses.  Pulmonary:     Effort: Tachypnea and respiratory distress present.     Breath sounds: Decreased breath sounds and wheezing present.     Comments: Respite distress.  Belly breathing.  Sats of 65% on 6 L via nasal cannula.  Expiratory wheezing with decreased lung sounds throughout Abdominal:     General: There is no distension.     Palpations: Abdomen is soft. There is no mass.     Tenderness: There is no abdominal tenderness. There is no guarding or rebound.  Musculoskeletal:     Cervical back: Normal range of motion and neck supple.     Comments: Tremor- baseline  Skin:    General: Skin is warm and dry.     Capillary Refill: Capillary refill takes less than 2 seconds.  Neurological:     Mental Status: She is alert.     Comments: Alert and answering questions appropriately  Psychiatric:        Mood and Affect: Mood and affect normal.        Speech: Speech normal.        Behavior: Behavior normal.    ED Results / Procedures / Treatments   Labs (all labs ordered are listed, but only abnormal results are displayed) Labs Reviewed  CBC WITH DIFFERENTIAL/PLATELET - Abnormal; Notable for the following components:      Result Value   WBC 11.5 (*)    Neutro Abs 9.2 (*)    Abs Immature Granulocytes 0.10 (*)    All other components within normal limits  COMPREHENSIVE METABOLIC PANEL - Abnormal; Notable for the following components:   Potassium 3.4 (*)    CO2 21 (*)    Glucose, Bld 108 (*)    Creatinine, Ser 1.51 (*)    Calcium 8.3 (*)     Total Protein 5.5 (*)    Albumin 2.6 (*)    GFR, Estimated 35 (*)    All other components within normal limits  LACTIC ACID, PLASMA - Abnormal; Notable for the following components:   Lactic Acid, Venous 2.1 (*)    All other components within normal limits  BRAIN NATRIURETIC PEPTIDE - Abnormal; Notable for the following components:   B Natriuretic Peptide 403.8 (*)    All other components within normal limits  I-STAT CHEM 8, ED - Abnormal; Notable for the following components:   Potassium 3.3 (*)    Creatinine, Ser 1.50 (*)    Glucose, Bld 101 (*)    Hemoglobin 11.9 (*)    HCT 35.0 (*)    All other components within normal limits  I-STAT ARTERIAL BLOOD GAS, ED - Abnormal; Notable for the following components:   pH, Arterial 7.643 (*)    pCO2 arterial 21.9 (*)    pO2, Arterial 212 (*)    Acid-Base Excess 4.0 (*)    Sodium 134 (*)    Potassium 3.2 (*)    Calcium, Ion 1.09 (*)  HCT 27.0 (*)    Hemoglobin 9.2 (*)    All other components within normal limits  RESP PANEL BY RT-PCR (FLU A&B, COVID) ARPGX2  CULTURE, BLOOD (ROUTINE X 2)  CULTURE, BLOOD (ROUTINE X 2)  LACTIC ACID, PLASMA  URINALYSIS, ROUTINE W REFLEX MICROSCOPIC    EKG None      Radiology DG Chest Portable 1 View  Result Date: 04/21/2021 CLINICAL DATA:  Hypoxia EXAM: PORTABLE CHEST 1 VIEW COMPARISON:  02/08/2021 FINDINGS: Unchanged gross cardiomegaly. Mild, diffuse bilateral interstitial pulmonary opacity. No new or focal airspace opacity. Osseous structures are unremarkable. IMPRESSION: Unchanged gross cardiomegaly. Mild, diffuse bilateral interstitial pulmonary opacity, likely edema. No new or focal airspace opacity. Electronically Signed   By: Eddie Candle M.D.   On: 04/21/2021 13:49    Procedures .Critical Care Performed by: Franchot Heidelberg, PA-C Authorized by: Franchot Heidelberg, PA-C   Critical care provider statement:    Critical care time (minutes):  65   Critical care time was exclusive of:   Separately billable procedures and treating other patients and teaching time   Critical care was necessary to treat or prevent imminent or life-threatening deterioration of the following conditions:  Cardiac failure and shock   Critical care was time spent personally by me on the following activities:  Blood draw for specimens, development of treatment plan with patient or surrogate, discussions with consultants, evaluation of patient's response to treatment, examination of patient, obtaining history from patient or surrogate, ordering and performing treatments and interventions, ordering and review of laboratory studies, ordering and review of radiographic studies, pulse oximetry, re-evaluation of patient's condition and review of old charts   Care discussed with: admitting provider     Medications Ordered in ED Medications  potassium chloride 10 mEq in 100 mL IVPB ( Intravenous Restarted 04/21/21 1609)  amiodarone (NEXTERONE) 1.8 mg/mL load via infusion 150 mg (150 mg Intravenous Bolus from Bag 04/21/21 1437)    Followed by  amiodarone (NEXTERONE PREMIX) 360-4.14 MG/200ML-% (1.8 mg/mL) IV infusion (0 mg/hr Intravenous Stopped 04/21/21 1500)    Followed by  amiodarone (NEXTERONE PREMIX) 360-4.14 MG/200ML-% (1.8 mg/mL) IV infusion (has no administration in time range)  norepinephrine (LEVOPHED) 20m in 2518mpremix infusion (4 mcg/min Intravenous Rate/Dose Change 04/21/21 1604)  ipratropium-albuterol (DUONEB) 0.5-2.5 (3) MG/3ML nebulizer solution 3 mL (3 mLs Nebulization Given by Other 04/21/21 1328)  lactated ringers bolus 1,000 mL (0 mLs Intravenous Stopped 04/21/21 1356)  acetaminophen (TYLENOL) suppository 650 mg (650 mg Rectal Given 04/21/21 1408)  magnesium sulfate IVPB 2 g 50 mL (0 g Intravenous Stopped 04/21/21 1500)  amiodarone (NEXTERONE) IV bolus only 150 mg/100 mL (0 mg Intravenous Stopped 04/21/21 1414)    ED Course  I have reviewed the triage vital signs and the nursing notes.  Pertinent  labs & imaging results that were available during my care of the patient were reviewed by me and considered in my medical decision making (see chart for details).    MDM Rules/Calculators/A&P                           Patient presenting due to weakness noted by family.  On arrival, patient is hypoxic with a sat of 65% on her baseline 8 L and she is hypotensive with a blood pressure in the 60s.  She has diminished air movement throughout and wheezing in all fields, with a history of pulmonary hypertension concern for this as cause and inducing shock.  She does  not sound rhonchorous and does not have significant pitting edema, doubt flash pulmonary edema at this time.  She feels warm, but no documented fever, consider sepsis but will wait on labs and see if there is a potential source. Fluids ordered for hypotension.  Patient started on BiPAP with a DuoNeb for respiratory status.  Labs show mild leukocytosis of 11.5.  Lactic minimally elevated at 2.   On BiPAP, patient's oxygenation improved.  However ABG concerning for severe alkalosis, patient is overbreathing the vent.  Will switch her from BiPAP to nonrebreather. BP improving with fluids.   Informed by RN that patient has new heart rhythm on the monitor.  Most consistent with V. tach.  Patient remains alert and has a pulse.  MOST form expressly indicates she does not want cardioversion.  Will start mag, potassium, and an amiodarone.  With amnio, heart rhythm improves.  After amio bolus, patient once again had abnormal heart rhythm on the monitor and became hypotensive.  We will give another bolus and started infusion.  Will consult with cardiology.   Had discussion with family regarding patient's wishes.  Family would like to honor the MOST and DNR forms.  Family is aware that patient is critically ill.  We will consult critical care and palliative care.  Discussed with critical care who will evaluate patient.   Final Clinical  Impression(s) / ED Diagnoses Final diagnoses:  Acute on chronic respiratory failure, unspecified whether with hypoxia or hypercapnia (HCC)  Hypotension, unspecified hypotension type    Rx / DC Orders ED Discharge Orders     None        Franchot Heidelberg, PA-C 04/21/21 [place], [place], MD 04/23/21 [phone]

## 2021-04-21 NOTE — ED Notes (Signed)
Per MD Kathrynn Humble, RN to d/c Amiodarone.

## 2021-04-21 NOTE — Progress Notes (Signed)
The following medications were taken to the inpatient pharmacy inside [facility] to be stored and dispensed:  1) Uptravi (selexipag) 1600 mcg 2) Uptravi (selexipag) 200 mcg    Medications to be returned to patient upon discharge.

## 2021-04-21 NOTE — Progress Notes (Addendum)
eLink Physician-Brief Progress Note Patient Name: Kimberly Irwin DOB: 17-Nov-1945 MRN: [id number]   Date of Service  04/21/2021  HPI/Events of Note  Brief HPI: 75 y.o. female  with past medical history of sarcoidosis (no active disease but remains on steroids due to shortness of breath), chronic hypoxic respiratory failure requiring 8L nasal cannula at baseline, emphysema, pulmonary hypertension, and HFpEF. In ICU for weakness, CHF . DNR status.   Data: Reviewed Pro cal low, LA 2.1, Hg > 11. Cr 1.3 K 3.2 Wbc normal.  7.64/21/212/24. UA neg  Camera : In sinus at 79, NRB mask.  Has involuntary movements of mandible. Holding her NRB mask, follows commands.  Speech is clear. Sats 88%. Wheezing +. Alert and oriented MAP 70.  A: Acute on chronic respiratory failure from CHF exacerbation. Sarcoidosis / PAH/Emphysema. HFpEF status . Respiratory alkaosis.  Covid/flu neg. AKI/electrolyte imbalance  eICU Interventions  - nebulization ordered. K/mag been replaced and for wheezing from ED.  - no definite pneumonia. Not on abx. Pro cal < 0.10 - aspiration precautions - BiPAP . Consider steroids if wheezing not better.  - follow ABG  - Cardiology input. ? V tach but could be artifact as per NP notes - consider Pulmonary help also. On [place] Rx ( selxipag/tadalafil/lasix) - resume home thyroid meds, celexa from AM.  - VTE: lovenox prophylaxis. - CBG goals < 180 - DNR.       Intervention Category Major Interventions: Respiratory failure - evaluation and management Evaluation Type: New Patient Evaluation  Elmer Sow 04/21/2021, 7:57 PM  23:08 Ok to take her  own scheduled selexipag as at home.   23:40 wheezing is much better.  Pt is on 8-10 liters O2 at home. is 90%on NRB.  Nurse is asking if 88% is acceptible, and could you put in orders for pulse ox parameters.  Discussed with RN. Some on going cough.  - follow ABG. - solumedrol 40 mg once. - no abx for now  00:20 Hypoxemia  on ABG, alkalosis resolved. To go on BiPAP 12/5 and prn ABG. Asp precautions.

## 2021-04-21 NOTE — ED Triage Notes (Signed)
BIB [facility] EMS after pt's daughter called to report that pt's pulse ox was unreadable. Per EMS, pt was hypotensive on scene 70/40, 02 sat 80's on 8L. Pt given 900 cc NS enroute.   Pt hypotensive upon hospital arrival ( 84/57) and 02 on 8L [place]. RT and MD called to bedside.

## 2021-04-21 NOTE — ED Notes (Signed)
Requested page to critical care to request orders for diet/PO intake.

## 2021-04-21 NOTE — Consult Note (Signed)
Consultation Note Date: 04/21/2021   Patient Name: Kimberly Irwin  DOB: 1945/08/07  MRN: [id number]  Age / Sex: 75 y.o., female  PCP: Kimberly Lukes, MD Referring Physician: Varney Biles, MD  Reason for Consultation: Establishing goals of care  HPI/Patient Profile: 75 y.o. female  with past medical history of sarcoidosis (no active disease but remains on steroids due to shortness of breath), chronic hypoxic respiratory failure requiring 8L nasal cannula at baseline, emphysema, pulmonary hypertension, and HFpEF. She presented to the emergency department from home on 04/21/21 with altered mental status and progressive weakness per family. Daughter called EMS reporting that patient's pulse oximetry was unreadable. On arrival to the ED patient was hypotensive and hypoxic with spo2 65% on home 8L. Placed in BiPAP with improvement in oxygenation. However BiPAP was removed when ABG revealed respiratory alkalosis. Chest x-ray consistent with pulmonary edema. Started on vasopressor therapy. Patient also found to have a cardiac arrhythmia concerning for v-tach, however later determined to be cardiac artifact secondary to essential tremor. Admitted to PCCM.   Clinical Assessment and Goals of Care: I have reviewed medical records including EPIC notes, labs and imaging, and discussed with ED physician Dr. Kathrynn Humble as well as Loree Fee NP with PCCM.   I met at bedside with patient and her daughter Kimberly Irwin to discuss diagnosis, prognosis, GOC, EOL wishes, disposition, and options. Patient is alert and oriented, able to participate in [place] discussion. She is on NRB mask. Levophed has been weaned down to 4 mcg/min.   I introduced Palliative Medicine as specialized medical care for people living with serious illness. It focuses on providing relief from the symptoms and stress of a serious illness.   We discussed a brief life review of  the patient. She has lived in [place] her entire life. She was married for 40 years; has been widowed since 2012. She has 2 daughters. 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 She is a woman of faith and member of the [employer] denomination.   Patient lives in her home in [place]. Daughter Kimberly Irwin has lived with her for many years and is her primary caregiver. Other daughter Kimberly Irwin lives in [place]. As far as functional status, there has been a decline since June. She is able to pivot and transfer (with assistance) to bedside commode and to her rollator. By sitting on her rollator, she can be transferred to the couch or to the shower, where she requires some assistance with bathing. Regarding nutritional status, she eats about 50% of most meals.  Patient used to enjoy going outside and participating in a Bible study group, but has not been able to do these things since her functional status has declined.   We discussed her current illness and what it means in the larger context of her ongoing co-morbidities.  Natural disease trajectory of chronic illness was discussed. Provided education that patient's pulmonary hypertension is severe and may be causing the hypotension and hypoxia. Patient and daughter understand that pulmonary hypertension is a non-curable condition.  The  difference between full scope medical intervention and comfort care was considered. Patient is clear she does not want aggressive or invasive measures. She is ok with a time trial of vasopressors, but would not want a central line. She is ok with BiPAP. She does not want cardioversion.   At this time patient and  would like to continue current medical interventions with watchful waiting.  They are agreeable to continue to gain answers through work-up and monitoring and will make stepwise decisions based off of that information. The goal is for patient to return home and return to baseline as much as possible.     Advanced directives,  concepts specific to code status, artifical feeding and hydration, and rehospitalization were considered and discussed. Reviewed MOST form on file. Patient wishes to make the following changes:   Cardiopulmonary Resuscitation: Do Not Attempt Resuscitation (DNR/No CPR)  Medical Interventions: Limited Additional Interventions: Use medical treatment, IV fluids and cardiac monitoring as indicated, DO NOT USE intubation or mechanical ventilation. May consider use of less invasive airway support such as BiPAP or CPAP. Also provide comfort measures. Transfer to the hospital if indicated. Avoid intensive care.   Antibiotics: Antibiotics if indicated  IV Fluids: IV fluids for a defined trial period    Feeding Tube: No feeding tube    Patient has HCPOA and living will documents on file in [place].  HCPOA is daughter Kimberly Irwin. Living will states that "I desire that my life not be prolonged by life-sustaining procedures if I am terminally ill, permanently in a coma, suffer severe dementia, or am in a persistent vegetative state".  Primary decision maker: PATIENT  SUMMARY OF RECOMMENDATIONS   DNR/DNI as previously documented Continue current medical interventions with watchful waiting Ok with BiPAP if needed No cardioversion Ok with time trial of vasopressors, but would not want a central line Please call daughter Kimberly Irwin with any updates if she is not at bedside PMT will follow-up tomorrow  Code Status/Advance Care Planning: DNR  Palliative Prophylaxis:  Oral Care and Turn Reposition  Additional Recommendations (Limitations, Scope, Preferences): No artificial feeding, no trach, no central line, no cardioversion  Psycho-social/Spiritual:  Created space and opportunity for patient and family to express thoughts and feelings regarding patient's current medical situation.  Emotional support provided   Prognosis:  Unable to determine  Discharge Planning: To be determined      Primary  Diagnoses: Present on Admission: **None**   I have reviewed the medical record, interviewed the patient and family, and examined the patient. The following aspects are pertinent.  Past Medical History:  Diagnosis Date   Bursitis of left hip 10/26/2012   Chronic low back pain    Chronic respiratory failure with hypoxia (HCC) 10/21/2016   Coronary artery calcification 07/03/2016   DDD (degenerative disc disease)    L3-4 with facet arthropathy and stenosis   Degenerative spondylolisthesis    L4-5 grade 1 with stenosis   Emphysema lung (HCC)    GERD (gastroesophageal reflux disease)    Hiatal hernia    Hyperlipidemia    Hypertension    Hypothyroidism 10/28/2015   Pain in joint, lower leg 10/26/2012   left    Pneumonia    as a child several times.    Pulmonary hypertension (HCC)    Sarcoidosis    Tremors of nervous system     Family History  Problem Relation Age of Onset   Cancer Mother 38       Breast   Heart disease Mother  Hypertension Mother    Hyperlipidemia Mother    Heart attack Mother    Asthma Maternal Grandmother    Scheduled Meds: Continuous Infusions:  amiodarone Stopped (04/21/21 1500)   Followed by   amiodarone     norepinephrine (LEVOPHED) Adult infusion 8 mcg/min (04/21/21 1514)   potassium chloride 10 mEq (04/21/21 1510)   PRN Meds:.  Allergies  Allergen Reactions   Lipitor [Atorvastatin] Swelling   Penicillins Swelling    Has patient had a PCN reaction causing immediate rash, facial/tongue/throat swelling, SOB or lightheadedness with hypotension: Yes Has patient had a PCN reaction causing severe rash involving mucus membranes or skin necrosis: No Has patient had a PCN reaction that required hospitalization: No Has patient had a PCN reaction occurring within the last 10 years: No If all of the above answers are "NO", then may proceed with Cephalosporin use.    Metronidazole Swelling   Pregabalin Other (See Comments)    REACTION: Somnolence  and dizziness   Simvastatin Other (See Comments)    Leg pain   Review of Systems  Respiratory:  Positive for shortness of breath.    Physical Exam Vitals reviewed.  Constitutional:      General: She is not in acute distress.    Comments: Chronically ill-appearing  Pulmonary:     Effort: Pulmonary effort is normal.     Comments: NRB mask Neurological:     Mental Status: She is alert and oriented to person, place, and time.     Motor: Weakness and tremor present.    Vital Signs: BP (!) 77/50   Pulse 69   Temp 99.6 F (37.6 C) (Rectal)   Resp 19   SpO2 95%         SpO2: SpO2: 95 % O2 Device:SpO2: 95 % O2 Flow Rate: .   IO: Intake/output summary:  Intake/Output Summary (Last 24 hours) at 04/21/2021 1515 Last data filed at 04/21/2021 1500 Gross per 24 hour  Intake 1252.17 ml  Output --  Net 1252.17 ml    Palliative Assessment/Data: PPS 40%    Time In: 1548 Time Out: 1700 Time Total: 72 minutes Greater than 50%  of this time was spent counseling and coordinating care related to the above assessment and plan.  Signed by: Lavena Bullion, NP   Please contact Palliative Medicine Team phone at [phone] for questions and concerns.  For individual provider: See Shea Evans

## 2021-04-21 NOTE — ED Notes (Signed)
Per PCCM PA Gerald Leitz, OK to give PO fluids

## 2021-04-21 NOTE — H&P (Signed)
NAME:  Kimberly Irwin, MRN:  [id number], DOB:  04-03-1946, LOS: 0 ADMISSION DATE:  04/21/2021, CONSULTATION DATE: 04/21/2021 REFERRING MD:  Dr. Kathrynn Humble, CHIEF COMPLAINT: Hypoxia and hypotension  History of Present Illness:  Kimberly Irwin is a 75 y.o. female with an extensive past medical history significant for but not limited to sarcoidosis (no active disease but remains on steroids due to SOB), chronic hypoxic respiratory failure requiring 8 L nasal cannula at baseline, emphysema, pulmonary hypertension, and HFpEF who presented to the emergency department via EMS today due to complaints of altered behavior and progressive weakness per family.  Daughter states patient had episode of full body shaking but did not lose consciousness or lose function of bowel or bladder.  Patient also had signs of hypoxia with cyanosis observed per family.  On ED arrival patient was seen hypotensive and hypoxic with SPO2 65 on home 8 L nasal cannula.  Patient was placed on BiPAP therapy with improvement in oxygenation.  However BiPAP removed when ABG revealed respiratory alkalosis with pH 7.64, CO2 21.9, bicarb 23.8.  Other pertinent lab work includes potassium 3.4, lactic acid 2.1, WBC 11.5.  Chest x-ray consistent with pulmonary edema.  Additionally while in the emergency department patient was seen with cardiac arrhythmia concerning for V. tach and patient was started on amiodarone drip with electrolyte replacement as patient presented with MOST form explicitly requesting no cardioversion.  Cardiology has been consulted and felt patient's arrhythmia was likely artifact from severe essential tremor and amiodarone drip was stopped.Marland Kitchen   PCCM consulted for further management and admission.  Pertinent  Medical History  Significant for but not limited to sarcoidosis (no active disease but remains on steroids due to SOB), chronic hypoxic respiratory failure requiring 8 L nasal cannula at baseline, emphysema, pulmonary  hypertension, and HFpEF  Significant Hospital Events:    Interim History / Subjective:  As above  Objective   Blood pressure (!) 77/50, pulse 69, temperature 99.6 F (37.6 C), temperature source Rectal, resp. rate 19, SpO2 95 %.        Intake/Output Summary (Last 24 hours) at 04/21/2021 1505 Last data filed at 04/21/2021 1500 Gross per 24 hour  Intake 1252.17 ml  Output --  Net 1252.17 ml   There were no vitals filed for this visit.  Examination: General: Acute on chronic ill-appearing elderly female lying in bed all in no acute distress HEENT: [place]/AT, MM pink/moist, PERRL, significant essential tremor mainly seen in jaw extending to upper torso Neuro: Alert and oriented x3, essential tremor CV: s1s2 regular rate and rhythm, no murmur, rubs, or gallops,  PULM: Clear to auscultation bilaterally, diminished bases, no increased work of breathing, on nonrebreather currently GI: soft, bowel sounds active in all 4 quadrants, non-tender, non-distended Extremities: warm/dry, no edema  Skin: no rashes or lesions  Resolved Hospital Problem list     Assessment & Plan:  Acute on chronic hypoxic respiratory failure -On 8 L nasal cannula at baseline Emphysema Sarcoidosis on chronic steroid 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  Patient does not appear acutely infected and/or septic nor does she appear in acute cardiogenic shock.  Per daughter patient's baseline blood  pressure ranges in upper 74M systolically. P: Continue to wean pressor support for MAP goal greater than 60 Continuous telemetry Close  monitoring of volume status  Hold on antibiotic therapy at this time Obtain sputum culture Obtain RVP panel Obtain procalcitonin  Chronic heart failure with preserved EF -Patient underwent right heart cath 01/07/2021 which revealed moderate pulmonary artery hypertension and normal filling pressures. Hypertension Concern for new arrhythmia including V. Tach -Patient was seen with change in rhythm in emergency department with concern for new arrhythmia however this appears to be artifact from significant essential tremor.  Cardiology stopped amiodarone drip.  Of note however patient was not want cardioversion if arrhythmia does develop. P: Cardiology consulted Continuous telemetry Resume home maintenance medication once reconciliation completed Trend BMP  Heart health diet with sodium restriction  Strict intake and output  Daily weight to assess volume status Daily assessment for need to diurese with the use of IV lasix  Closely monitor renal function and electrolytes repeat limited echo   Essential tremor -Daughter states this is patient's normal baseline and tremor does worsen with stress or illness P: Supportive care  Hypokalemia P: Supplement as needed Trend to be met  Goals of care Per chart review and discussion with daughter patient has had extensive advanced care planning completed with established DNI/DNR and MOST form.  Palliative care has been consulted to help family further determine acute goals of care including but not limited to desire for aggressive measures including vasopressor support.  We will continue to coordinate with palliative care and family to help patient determine best plan of care.   Best Practice (right click and "Reselect all SmartList Selections" daily)   Diet/type: Regular consistency (see orders) DVT prophylaxis: LMWH GI prophylaxis: N/A Lines: N/A Foley:  N/A Code Status:  DNR Last date of multidisciplinary goals of care discussion:  Discussed plan of care at bedside with daughter and patient day of admission 9/18   Labs   CBC: Recent Labs  Lab 04/21/21 1309 04/21/21 1317 04/21/21 1359  WBC 11.5*  --   --   NEUTROABS 9.2*  --   --   HGB 12.0 11.9* 9.2*  HCT 36.5 35.0* 27.0*  MCV 90.8  --   --   PLT 210  --   --     Basic Metabolic Panel: Recent Labs  Lab 04/21/21 1309 04/21/21 1317 04/21/21 1359  NA 137 139 134*  K 3.4* 3.3* 3.2*  CL 104 103  --   CO2 21*  --   --   GLUCOSE 108* 101*  --   BUN 21 21  --   CREATININE 1.51* 1.50*  --   CALCIUM 8.3*  --   --    GFR: CrCl cannot be calculated (Unknown ideal weight.). Recent Labs  Lab 04/21/21 1309  WBC 11.5*  LATICACIDVEN 2.1*    Liver Function Tests: Recent Labs  Lab 04/21/21 1309  AST 17  ALT 8  ALKPHOS 40  BILITOT 0.6  PROT 5.5*  ALBUMIN 2.6*   No results for input(s): LIPASE, AMYLASE in the last 168 hours. No results for input(s): AMMONIA in the last 168 hours.  ABG    Component Value Date/Time   PHART 7.643 (HH) 04/21/2021 1359   PCO2ART 21.9 (L) 04/21/2021 1359   PO2ART 212 (H) 04/21/2021 1359   HCO3 23.8 04/21/2021 1359   TCO2 24 04/21/2021 1359   O2SAT 100.0 04/21/2021 1359     Coagulation Profile: No results for input(s): INR, PROTIME in  the last 168 hours.  Cardiac Enzymes: No results for input(s): CKTOTAL, CKMB, CKMBINDEX, TROPONINI in the last 168 hours.  HbA1C: Hgb A1c MFr Bld  Date/Time Value Ref Range Status  09/14/2018 05:51 PM 5.4 4.6 - 6.5 % Final    Comment:    Glycemic Control Guidelines for People with Diabetes:Non Diabetic:  <6%Goal of Therapy: <7%Additional Action Suggested:  >8%   06/19/2017 11:35 AM 5.5 4.6 - 6.5 % Final    Comment:    Glycemic Control Guidelines for People with Diabetes:Non Diabetic:  <6%Goal of Therapy: <7%Additional Action Suggested:  >8%     CBG: No results for input(s): GLUCAP in the last 168 hours.  Review of Systems:   Please see the history of present illness. All  other systems reviewed and are negative    Past Medical History:  She,  has a past medical history of Bursitis of left hip (10/26/2012), Chronic low back pain, Chronic respiratory failure with hypoxia ([place]) (10/21/2016), Coronary artery calcification (07/03/2016), DDD (degenerative disc disease), Degenerative spondylolisthesis, Emphysema lung ([place]), GERD (gastroesophageal reflux disease), Hiatal hernia, Hyperlipidemia, Hypertension, Hypothyroidism (10/28/2015), Pain in joint, lower leg (10/26/2012), Pneumonia, Pulmonary hypertension ([place]), Sarcoidosis, and Tremors of nervous system.   Surgical History:   Past Surgical History:  Procedure Laterality Date   ANTERIOR LAT LUMBAR FUSION Left 08/29/2014   Procedure: EXTREME LEFT LATERAL INTERBODY FUSION LUMBAR TWO-THREE LATERAL PLATE;  Surgeon: Charlie Pitter, MD;  Location: [facility];  Service: Neurosurgery;  Laterality: Left;   BACK SURGERY  01/15/09   L3-4 and L4-5 decompressive laminectomy with bilateral L3, L4, and L5 decompressive foraminotomies, more than it would be required for simple interbody fusion alone.   BACK SURGERY  01/15/09   L3-4 and L4-5 posterior lumbar interbody fusion utilizing tanget interbody allograft wedge, Telamon interbody PEEk cage, and local autografting.   BACK SURGERY  01/15/09   L3, L4, and L5 posterolateral arthrodesis using segmental pedicle screw fixation and local autografting.   CARDIAC CATHETERIZATION     CARPAL TUNNEL RELEASE Bilateral    COLONOSCOPY     ESOPHAGOGASTRODUODENOSCOPY     EYE SURGERY     lens implant   JOINT REPLACEMENT  09/13/09   Right Hip, Dr. Alvan Dame   KYPHOPLASTY N/A 09/14/2014   Procedure: Lumbar Two Kyphoplasty/Vertebroplasty;  Surgeon: Charlie Pitter, MD;  Location: [facility];  Service: Neurosurgery;  Laterality: N/A;  Lumbar Two Kyphoplasty/Vertebroplasty   RIGHT HEART CATH N/A 11/19/2017   Procedure: RIGHT HEART CATH;  Surgeon: Larey Dresser, MD;  Location: [facility];  Service:  Cardiovascular;  Laterality: N/A;   RIGHT HEART CATH N/A 05/27/2019   Procedure: RIGHT HEART CATH;  Surgeon: Larey Dresser, MD;  Location: [facility];  Service: Cardiovascular;  Laterality: N/A;   RIGHT HEART CATH N/A 01/07/2021   Procedure: RIGHT HEART CATH;  Surgeon: Larey Dresser, MD;  Location: [facility];  Service: Cardiovascular;  Laterality: N/A;   THYROIDECTOMY     TOTAL HIP ARTHROPLASTY Left 06/28/2013   Procedure: LEFT TOTAL  HIP ARTHROPLASTY ANTERIOR APPROACH;  Surgeon: Mauri Pole, MD;  Location: [facility];  Service: Orthopedics;  Laterality: Left;     Social History:   reports that she quit smoking about 26 years ago. Her smoking use included cigarettes. She has a 16.00 pack-year smoking history. She has never used smokeless tobacco. She reports that she does not drink alcohol and does not use drugs.   Family History:  Her family history includes Asthma in her maternal grandmother; Cancer (age of onset: 75) in her mother; Heart attack in her mother; Heart disease in her mother; Hyperlipidemia in her mother; Hypertension in her mother.   Allergies Allergies  Allergen Reactions   Lipitor [Atorvastatin] Swelling   Penicillins Swelling    Has patient had a PCN reaction causing immediate rash, facial/tongue/throat swelling, SOB or lightheadedness with hypotension: Yes Has patient had a PCN reaction causing severe rash involving mucus membranes or skin necrosis: No Has patient had a PCN reaction that required hospitalization: No Has patient had a PCN reaction occurring within the last 10 years: No If all of the above answers are "NO", then may proceed with Cephalosporin use.    Metronidazole Swelling   Pregabalin Other (See Comments)    REACTION: Somnolence and dizziness   Simvastatin Other (See Comments)    Leg pain     Home Medications  Prior to Admission medications   Medication Sig Start Date End Date Taking? Authorizing Provider  acetaminophen  (TYLENOL) 500 MG tablet Take 1,000 mg by mouth every 6 (six) hours as needed for moderate pain.    [provider]  albuterol (PROVENTIL) (2.5 MG/3ML) 0.083% nebulizer solution Take 2.5 mg by nebulization every 6 (six) hours as needed for wheezing or shortness of breath.    [provider]  aspirin EC 81 MG tablet Take 81 mg by mouth at bedtime.     [provider]  atenolol (TENORMIN) 25 MG tablet TAKE 1 TABLET (25 MG TOTAL) BY MOUTH AT BEDTIME. TAKE THE 50 MG ATENOLOL IN AM AND THE 25 MG IN PM 08/15/20   Mosie Lukes, MD  atenolol (TENORMIN) 50 MG tablet TAKE 1 TABLET BY MOUTH EVERY DAY 02/01/21   Mosie Lukes, MD  BESIVANCE 0.6 % SUSP Place 1 drop into the left eye See admin instructions. 1 drop into left eye QID for 2 days after eye injection 12/18/14   [provider]  calcium carbonate (OSCAL) 1500 (600 Ca) MG TABS tablet Take 1,500 mg by mouth daily with breakfast.     [provider]  Cholecalciferol (VITAMIN D) 2000 UNITS tablet Take 2,000 Units by mouth every evening.     [provider]  CINNAMON PO Take 2,000 mg by mouth daily.    [provider]  citalopram (CELEXA) 10 MG tablet Take 1 tablet (10 mg total) by mouth daily. 09/14/20   Saguier, Percell Miller, PA-C  Coenzyme Q10 200 MG capsule Take 200 mg by mouth at bedtime.     [provider]  Fexofenadine HCl (ALLEGRA ALLERGY PO) Take 1 tablet by mouth daily.    [provider]  Fluticasone-Umeclidin-Vilant (TRELEGY ELLIPTA) 100-62.5-25 MCG/INH AEPB Inhale 1 Inhaler into the lungs daily. 02/27/21   Hunsucker, Bonna Gains, MD  furosemide (LASIX) 40 MG tablet Take 1 tablet (40 mg total) by mouth daily. 03/21/21   Hunsucker, Bonna Gains, MD  gabapentin (NEURONTIN) 300 MG capsule Take 1 capsule (300 mg total) by mouth at bedtime. 09/14/20   Saguier, Percell Miller, PA-C  hydrochlorothiazide (MICROZIDE) 12.5 MG capsule TAKE 1 CAPSULE BY MOUTH EVERY DAY 02/01/21   Larey Dresser, MD   ipratropium-albuterol (DUONEB) 0.5-2.5 (3) MG/3ML SOLN Take 3 mLs by nebulization every 6 (six) hours. 03/06/21   Hunsucker, Bonna Gains, MD  ketoconazole (NIZORAL) 2 % shampoo Apply 1 application topically daily as needed for irritation (scalp).    [provider]  levocetirizine (XYZAL) 5 MG tablet TAKE  1 TABLET BY MOUTH EVERY DAY IN THE EVENING 12/06/20   Saguier, Percell Miller, PA-C  levothyroxine (SYNTHROID) 112 MCG tablet TAKE 1 TABLET BY MOUTH EVERY DAY BEFORE BREAKFAST 04/15/21   Mosie Lukes, MD  Omega-3 Fatty Acids (FISH OIL) 1200 MG CAPS Take 1,200 mg by mouth daily.    [provider]  omeprazole (PRILOSEC) 40 MG capsule TAKE 1 CAPSULE BY MOUTH EVERY DAY 04/15/21   Mosie Lukes, MD  potassium chloride (KLOR-CON) 10 MEQ tablet Take 2 tablets (20 mEq total) by mouth daily. 02/27/21   Milford, Maricela Bo, FNP  rosuvastatin (CRESTOR) 20 MG tablet TAKE 1 TABLET BY MOUTH EVERY DAY Patient taking differently: Take 20 mg by mouth daily. 02/01/21   Mosie Lukes, MD  Selexipag 1600 MCG TABS Take 1 tablet (1,600 mcg total) by mouth 2 (two) times daily. 01/14/21   Damita Lack, MD  Spacer/Aero-Holding Chambers (AEROCHAMBER PLUS WITH MASK) inhaler Use as instructed 01/28/18   Debbe Odea, MD  tadalafil, PAH, (ADCIRCA) 20 MG tablet TAKE 2 TABLETS BY MOUTH EVERY DAY 10/16/20   Larey Dresser, MD  traMADol (ULTRAM) 50 MG tablet Take 1 tablet (50 mg total) by mouth every 6 (six) hours as needed for moderate pain or severe pain. 01/14/21 01/14/22  Amin, Jeanella Flattery, MD  vitamin B-12 (CYANOCOBALAMIN) 1000 MCG tablet Take 1,000 mcg by mouth daily.    [provider]     Critical care time:    Performed by:  D. Harris  Total critical care time: 45 minutes  Critical care time was exclusive of separately billable procedures and treating other patients.  Critical care was necessary to treat or prevent imminent or life-threatening deterioration.  Critical care was time  spent personally by me on the following activities: development of treatment plan with patient and/or surrogate as well as nursing, discussions with consultants, evaluation of patient's response to treatment, examination of patient, obtaining history from patient or surrogate, ordering and performing treatments and interventions, ordering and review of laboratory studies, ordering and review of radiographic studies, pulse oximetry and re-evaluation of patient's condition.   D. Kenton Kingfisher, NP-C [facility] Personal contact information can be found on Amion  04/21/2021, 4:32 PM

## 2021-04-22 DIAGNOSIS — J9620 Acute and chronic respiratory failure, unspecified whether with hypoxia or hypercapnia: Secondary | ICD-10-CM | POA: Diagnosis not present

## 2021-04-22 DIAGNOSIS — I270 Primary pulmonary hypertension: Secondary | ICD-10-CM

## 2021-04-22 DIAGNOSIS — I959 Hypotension, unspecified: Secondary | ICD-10-CM | POA: Diagnosis not present

## 2021-04-22 DIAGNOSIS — R0902 Hypoxemia: Secondary | ICD-10-CM

## 2021-04-22 DIAGNOSIS — Z515 Encounter for palliative care: Secondary | ICD-10-CM | POA: Diagnosis not present

## 2021-04-22 LAB — BLOOD CULTURE ID PANEL (REFLEXED) - BCID2

## 2021-04-22 LAB — BASIC METABOLIC PANEL
Anion gap: 9 (ref 5–15)
Anion gap: 13 (ref 5–15)
BUN: 18 mg/dL (ref 8–23)
BUN: 17 mg/dL (ref 8–23)
CO2: 25 mmol/L (ref 22–32)
CO2: 26 mmol/L (ref 22–32)
Calcium: 8.5 mg/dL — ABNORMAL LOW (ref 8.9–10.3)
Calcium: 9.2 mg/dL (ref 8.9–10.3)
Chloride: 100 mmol/L (ref 98–111)
Chloride: 100 mmol/L (ref 98–111)
Creatinine, Ser: 1.39 mg/dL — ABNORMAL HIGH (ref 0.44–1.00)
Creatinine, Ser: 1.34 mg/dL — ABNORMAL HIGH (ref 0.44–1.00)
GFR, Estimated: 39 mL/min — ABNORMAL LOW (ref >60)
GFR, Estimated: 41 mL/min — ABNORMAL LOW (ref >60)
Glucose, Bld: 89 mg/dL (ref 70–99)
Glucose, Bld: 120 mg/dL — ABNORMAL HIGH (ref 70–99)
Potassium: 3.4 mmol/L — ABNORMAL LOW (ref 3.5–5.1)
Potassium: 3.3 mmol/L — ABNORMAL LOW (ref 3.5–5.1)
Sodium: 134 mmol/L — ABNORMAL LOW (ref 135–145)
Sodium: 139 mmol/L (ref 135–145)

## 2021-04-22 LAB — POCT I-STAT 7, (LYTES, BLD GAS, ICA,H+H)
Acid-Base Excess: 0 mmol/L (ref 0.0–2.0)
Bicarbonate: 23.5 mmol/L (ref 20.0–28.0)
Calcium, Ion: 1.21 mmol/L (ref 1.15–1.40)
HCT: 28 % — ABNORMAL LOW (ref 36.0–46.0)
Hemoglobin: 9.5 g/dL — ABNORMAL LOW (ref 12.0–15.0)
O2 Saturation: 91 %
Patient temperature: 98.1
Potassium: 3.3 mmol/L — ABNORMAL LOW (ref 3.5–5.1)
Sodium: 137 mmol/L (ref 135–145)
TCO2: 25 mmol/L (ref 22–32)
pCO2 arterial: 34.4 mmHg (ref 32.0–48.0)
pH, Arterial: 7.441 (ref 7.350–7.450)
pO2, Arterial: 58 mmHg — ABNORMAL LOW (ref 83.0–108.0)

## 2021-04-22 LAB — GLUCOSE, CAPILLARY: Glucose-Capillary: 119 mg/dL — ABNORMAL HIGH (ref 70–99)

## 2021-04-22 LAB — CBC
HCT: 31.8 % — ABNORMAL LOW (ref 36.0–46.0)
Hemoglobin: 10.9 g/dL — ABNORMAL LOW (ref 12.0–15.0)
MCH: 30.5 pg (ref 26.0–34.0)
MCHC: 34.3 g/dL (ref 30.0–36.0)
MCV: 89.1 fL (ref 80.0–100.0)
Platelets: 177 10*3/uL (ref 150–400)
RBC: 3.57 MIL/uL — ABNORMAL LOW (ref 3.87–5.11)
RDW: 14.5 % (ref 11.5–15.5)
WBC: 6.9 10*3/uL (ref 4.0–10.5)
nRBC: 0 % (ref 0.0–0.2)

## 2021-04-22 LAB — MAGNESIUM: Magnesium: 2.1 mg/dL (ref 1.7–2.4)

## 2021-04-22 LAB — PHOSPHORUS: Phosphorus: 3.1 mg/dL (ref 2.5–4.6)

## 2021-04-22 MED ORDER — TADALAFIL 20 MG PO TABS
40.0000 mg | ORAL_TABLET | Freq: Every day | ORAL | Status: DC
  Administered 2021-04-22 – 2021-04-27 (×6): 40 mg via ORAL
  Filled 2021-04-22 (×6): qty 2

## 2021-04-22 MED ORDER — ARFORMOTEROL TARTRATE 15 MCG/2ML IN NEBU
15.0000 ug | INHALATION_SOLUTION | Freq: Two times a day (BID) | RESPIRATORY_TRACT | Status: DC
  Administered 2021-04-22 – 2021-04-27 (×10): 15 ug via RESPIRATORY_TRACT
  Filled 2021-04-22 (×13): qty 2

## 2021-04-22 MED ORDER — ROSUVASTATIN CALCIUM 20 MG PO TABS
20.0000 mg | ORAL_TABLET | Freq: Every day | ORAL | Status: DC
  Administered 2021-04-22 – 2021-04-27 (×5): 20 mg via ORAL
  Filled 2021-04-22 (×6): qty 1

## 2021-04-22 MED ORDER — POTASSIUM CHLORIDE CRYS ER 20 MEQ PO TBCR
40.0000 meq | EXTENDED_RELEASE_TABLET | Freq: Once | ORAL | Status: AC
  Administered 2021-04-22: 40 meq via ORAL
  Filled 2021-04-22: qty 2

## 2021-04-22 MED ORDER — GERHARDT'S BUTT CREAM
TOPICAL_CREAM | Freq: Two times a day (BID) | CUTANEOUS | Status: DC
  Administered 2021-04-24 – 2021-04-25 (×2): 1 via TOPICAL
  Filled 2021-04-22 (×2): qty 1

## 2021-04-22 MED ORDER — BUDESONIDE 0.25 MG/2ML IN SUSP
0.2500 mg | Freq: Two times a day (BID) | RESPIRATORY_TRACT | Status: DC
  Administered 2021-04-22 – 2021-04-27 (×10): 0.25 mg via RESPIRATORY_TRACT
  Filled 2021-04-22 (×10): qty 2

## 2021-04-22 MED ORDER — LOPERAMIDE HCL 2 MG PO CAPS
2.0000 mg | ORAL_CAPSULE | ORAL | Status: DC | PRN
  Administered 2021-04-22 – 2021-04-26 (×3): 2 mg via ORAL
  Filled 2021-04-22 (×5): qty 1

## 2021-04-22 MED ORDER — LEVOTHYROXINE SODIUM 112 MCG PO TABS
112.0000 ug | ORAL_TABLET | Freq: Every day | ORAL | Status: DC
  Administered 2021-04-22 – 2021-04-27 (×6): 112 ug via ORAL
  Filled 2021-04-22 (×7): qty 1

## 2021-04-22 MED ORDER — ENSURE ENLIVE PO LIQD
237.0000 mL | ORAL | Status: DC
  Administered 2021-04-22 – 2021-04-24 (×2): 237 mL via ORAL

## 2021-04-22 MED ORDER — ASPIRIN EC 81 MG PO TBEC
81.0000 mg | DELAYED_RELEASE_TABLET | Freq: Every day | ORAL | Status: DC
  Administered 2021-04-22 – 2021-04-27 (×6): 81 mg via ORAL
  Filled 2021-04-22 (×6): qty 1

## 2021-04-22 MED ORDER — POTASSIUM CHLORIDE 10 MEQ/100ML IV SOLN
10.0000 meq | INTRAVENOUS | Status: AC
  Administered 2021-04-22 – 2021-04-23 (×6): 10 meq via INTRAVENOUS
  Filled 2021-04-22 (×6): qty 100

## 2021-04-22 MED ORDER — LOPERAMIDE HCL 2 MG PO CAPS
4.0000 mg | ORAL_CAPSULE | Freq: Once | ORAL | Status: AC
  Administered 2021-04-22: 4 mg via ORAL
  Filled 2021-04-22: qty 2

## 2021-04-22 MED ORDER — FUROSEMIDE 10 MG/ML IJ SOLN
40.0000 mg | Freq: Once | INTRAMUSCULAR | Status: AC
  Administered 2021-04-22: 40 mg via INTRAVENOUS
  Filled 2021-04-22: qty 4

## 2021-04-22 MED ORDER — POTASSIUM CHLORIDE 20 MEQ PO PACK
40.0000 meq | PACK | ORAL | Status: DC
  Filled 2021-04-22: qty 2

## 2021-04-22 MED ORDER — CITALOPRAM HYDROBROMIDE 10 MG PO TABS
10.0000 mg | ORAL_TABLET | Freq: Every day | ORAL | Status: DC
  Administered 2021-04-22 – 2021-04-27 (×6): 10 mg via ORAL
  Filled 2021-04-22 (×6): qty 1

## 2021-04-22 MED ORDER — REVEFENACIN 175 MCG/3ML IN SOLN
175.0000 ug | Freq: Every day | RESPIRATORY_TRACT | Status: DC
  Administered 2021-04-23 – 2021-04-27 (×5): 175 ug via RESPIRATORY_TRACT
  Filled 2021-04-22 (×7): qty 3

## 2021-04-22 MED ORDER — PANTOPRAZOLE SODIUM 40 MG PO TBEC
40.0000 mg | DELAYED_RELEASE_TABLET | Freq: Every day | ORAL | Status: DC
  Administered 2021-04-22 – 2021-04-27 (×6): 40 mg via ORAL
  Filled 2021-04-22 (×6): qty 1

## 2021-04-22 NOTE — Progress Notes (Signed)
PHARMACY - PHYSICIAN COMMUNICATION CRITICAL VALUE ALERT - BLOOD CULTURE IDENTIFICATION (BCID)  Kimberly Irwin is an 75 y.o. female who presented to [facility] on 04/21/2021 with a chief complaint of hypoxia and hypotension.  Assessment:   Blood Cultures:1/4 bottles [facility] BCID detected Staphylococcus Epidermidis  Name of physician (or Provider) Contacted: [facility] Pharmacy Team  Current antibiotics: None  Changes to prescribed antibiotics recommended:  Staphylococcus epidermidis likely a contaminant. No additional antibiotics indicated at this time.   Results for orders placed or performed during the hospital encounter of 04/21/21  Blood Culture ID Panel (Reflexed) (Collected: 04/21/2021  1:24 PM)  Result Value Ref Range   Enterococcus faecalis NOT DETECTED NOT DETECTED   Enterococcus Faecium NOT DETECTED NOT DETECTED   Listeria monocytogenes NOT DETECTED NOT DETECTED   Staphylococcus species DETECTED (A) NOT DETECTED   Staphylococcus aureus (BCID) NOT DETECTED NOT DETECTED   Staphylococcus epidermidis DETECTED (A) NOT DETECTED   Staphylococcus lugdunensis NOT DETECTED NOT DETECTED   Streptococcus species NOT DETECTED NOT DETECTED   Streptococcus agalactiae NOT DETECTED NOT DETECTED   Streptococcus pneumoniae NOT DETECTED NOT DETECTED   Streptococcus pyogenes NOT DETECTED NOT DETECTED   A.calcoaceticus-baumannii NOT DETECTED NOT DETECTED   Bacteroides fragilis NOT DETECTED NOT DETECTED   Enterobacterales NOT DETECTED NOT DETECTED   Enterobacter cloacae complex NOT DETECTED NOT DETECTED   Escherichia coli NOT DETECTED NOT DETECTED   Klebsiella aerogenes NOT DETECTED NOT DETECTED   Klebsiella oxytoca NOT DETECTED NOT DETECTED   Klebsiella pneumoniae NOT DETECTED NOT DETECTED   Proteus species NOT DETECTED NOT DETECTED   Salmonella species NOT DETECTED NOT DETECTED   Serratia marcescens NOT DETECTED NOT DETECTED   Haemophilus influenzae NOT DETECTED NOT DETECTED   Neisseria meningitidis  NOT DETECTED NOT DETECTED   Pseudomonas aeruginosa NOT DETECTED NOT DETECTED   Stenotrophomonas maltophilia NOT DETECTED NOT DETECTED   Candida albicans NOT DETECTED NOT DETECTED   Candida auris NOT DETECTED NOT DETECTED   Candida glabrata NOT DETECTED NOT DETECTED   Candida krusei NOT DETECTED NOT DETECTED   Candida parapsilosis NOT DETECTED NOT DETECTED   Candida tropicalis NOT DETECTED NOT DETECTED   Cryptococcus neoformans/gattii NOT DETECTED NOT DETECTED   Methicillin resistance mecA/C NOT DETECTED NOT DETECTED    Lestine Box, PharmD PGY2 Infectious Diseases Pharmacy Resident   Please check [email or web address] for unit-specific pharmacy phone numbers

## 2021-04-22 NOTE — Progress Notes (Signed)
Pt has PRN Bipap orders.  Encourage pt to wear Bipap to rest for the night, pt doesn't want to wear it, no distress at this time.

## 2021-04-22 NOTE — Plan of Care (Signed)
  Problem: Education: Goal: Knowledge of General Education information will improve Description: Including pain rating scale, medication(s)/side effects and non-pharmacologic comfort measures Outcome: Progressing   Problem: Elimination: Goal: Will not experience complications related to urinary retention Outcome: Progressing

## 2021-04-22 NOTE — Progress Notes (Signed)
NAME:  Kimberly Irwin, MRN:  [id number], DOB:  07-08-1946, LOS: 1 ADMISSION DATE:  04/21/2021, CONSULTATION DATE: 04/21/2021 REFERRING MD:  Dr. Kathrynn Humble, CHIEF COMPLAINT: Hypoxia and hypotension  History of Present Illness:  Kimberly Irwin is a 75 y.o. female with an extensive past medical history significant for but not limited to sarcoidosis (no active disease but remains on steroids due to SOB), chronic hypoxic respiratory failure requiring 8 L nasal cannula at baseline, emphysema, pulmonary hypertension, and HFpEF who presented to the emergency department via EMS today due to complaints of altered behavior and progressive weakness per family.  Daughter states patient had episode of full body shaking but did not lose consciousness or lose function of bowel or bladder.  Patient also had signs of hypoxia with cyanosis observed per family.  On ED arrival patient was seen hypotensive and hypoxic with SPO2 65 on home 8 L nasal cannula.  Patient was placed on BiPAP therapy with improvement in oxygenation.  However BiPAP removed when ABG revealed respiratory alkalosis with pH 7.64, CO2 21.9, bicarb 23.8.  Other pertinent lab work includes potassium 3.4, lactic acid 2.1, WBC 11.5.  Chest x-ray consistent with pulmonary edema.  Additionally while in the emergency department patient was seen with cardiac arrhythmia concerning for V. tach and patient was started on amiodarone drip with electrolyte replacement as patient presented with MOST form explicitly requesting no cardioversion.  Cardiology has been consulted and felt patient's arrhythmia was likely artifact from severe essential tremor and amiodarone drip was stopped.Marland Kitchen   PCCM consulted for further management and admission.  Pertinent  Medical History  Significant for but not limited to sarcoidosis (no active disease but remains on steroids due to SOB), chronic hypoxic respiratory failure requiring 8 L nasal cannula at baseline, emphysema, pulmonary  hypertension, and HFpEF  Significant Hospital Events:  9/19 admitted with acute on chronic respiratory failure likely due to volume overload, weaned to 10L [place], given IV lasix awaiting response  Interim History / Subjective:  As above  Objective   Blood pressure 114/68, pulse 70, temperature (!) 97.5 F (36.4 C), temperature source Axillary, resp. rate 16, height 5' 3.5" (1.613 m), weight 66.4 kg, SpO2 92 %.    FiO2 (%):  [80 %] 80 %   Intake/Output Summary (Last 24 hours) at 04/22/2021 0941 Last data filed at 04/22/2021 0900 Gross per 24 hour  Intake 1752.17 ml  Output 250 ml  Net 1502.17 ml    Filed Weights   04/21/21 2000 04/22/21 0500  Weight: 65.4 kg 66.4 kg    Examination: General: Acute on chronic ill-appearing elderly female lying in bed all in no acute distress HEENT: [place]/AT, MM pink/moist, PERRL, significant essential tremor mainly seen in jaw extending to upper torso Neuro: Alert and oriented x3, essential tremor CV: s1s2 regular rate and rhythm, no murmur, rubs, or gallops,  PULM: Clear to auscultation bilaterally, diminished bases, no increased work of breathing, on nonrebreather currently GI: soft, bowel sounds active in all 4 quadrants, non-tender, non-distended Extremities: warm/dry, no edema  Skin: no rashes or lesions  Resolved Hospital Problem list     Assessment & Plan:  Acute on chronic hypoxic respiratory failure -On 8 L nasal cannula at baseline Emphysema Sarcoidosis on chronic steroid 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  Per daughter patient's baseline blood pressure ranges in upper 03T  systolically. --MAP goal 60  Chronic heart failure with preserved EF Hypertension Concern for new arrhythmia including V. Tach --Patient was seen with change in rhythm in emergency department with concern for new arrhythmia however this appears to be artifact from significant essential tremor.  Cardiology stopped amiodarone drip.  Of note however patient was not want cardioversion if arrhythmia does develop. --Strict intake and output  --Daily weight to assess volume status --Lasix IV --Stop Amio  Essential tremor: At baseline --Supportive care  Hypokalemia --supplement   Goals of care Per chart review and discussion with daughter patient has had extensive advanced care planning completed with established DNI/DNR and MOST form.  Palliative care has been consulted to help family further determine acute goals of care including but not limited to desire for aggressive measures including vasopressor support.  We will continue to coordinate with palliative care and family to help patient determine best plan of care.   Best Practice (right click and "Reselect all SmartList Selections" daily)   Diet/type: Regular consistency (see orders) DVT prophylaxis: LMWH GI prophylaxis: N/A Lines: N/A Foley:  N/A Code Status:  DNR Last date of multidisciplinary goals of care discussion: Discussed plan of care at bedside with daughter and patient day of admission 9/18   Labs   CBC: Recent Labs  Lab 04/21/21 1309 04/21/21 1317 04/21/21 1359 04/21/21 1613 04/22/21 0014 04/22/21 0042  WBC 11.5*  --   --  9.0  --  6.9  NEUTROABS 9.2*  --   --   --   --   --   HGB 12.0 11.9* 9.2* 11.4* 9.5* 10.9*  HCT 36.5 35.0* 27.0* 34.1* 28.0* 31.8*  MCV 90.8  --   --  90.5  --  89.1  PLT 210  --   --  183  --  177     Basic Metabolic Panel: Recent Labs  Lab 04/21/21 1309 04/21/21 1317 04/21/21 1359 04/21/21 1613 04/22/21 0014 04/22/21 0042  NA 137 139 134*  --  137 134*  K 3.4* 3.3* 3.2*  --   3.3* 3.4*  CL 104 103  --   --   --  100  CO2 21*  --   --   --   --  25  GLUCOSE 108* 101*  --   --   --  89  BUN 21 21  --   --   --  18  CREATININE 1.51* 1.50*  --  1.39*  --  1.39*  CALCIUM 8.3*  --   --   --   --  8.5*  MG  --   --   --   --   --  2.1  PHOS  --   --   --   --   --  3.1    GFR: Estimated Creatinine Clearance: 31.4 mL/min (A) (by C-G formula based on SCr of 1.39 mg/dL (H)). Recent Labs  Lab 04/21/21 1309 04/21/21 1613 04/21/21 1700 04/21/21 2047 04/22/21 0042  PROCALCITON  --   --  <0.10  --   --   WBC 11.5* 9.0  --   --  6.9  LATICACIDVEN 2.1*  --   --  1.5  --      Liver Function Tests: Recent Labs  Lab 04/21/21 1309  AST 17  ALT 8  ALKPHOS 40  BILITOT 0.6  PROT 5.5*  ALBUMIN 2.6*    No results for input(s): LIPASE, AMYLASE in the last 168 hours. No results for input(s): AMMONIA in the last 168 hours.  ABG    Component Value Date/Time   PHART 7.441 04/22/2021 0014   PCO2ART 34.4 04/22/2021 0014   PO2ART 58 (L) 04/22/2021 0014   HCO3 23.5 04/22/2021 0014   TCO2 25 04/22/2021 0014   O2SAT 91.0 04/22/2021 0014      Coagulation Profile: No results for input(s): INR, PROTIME in the last 168 hours.  Cardiac Enzymes: No results for input(s): CKTOTAL, CKMB, CKMBINDEX, TROPONINI in the last 168 hours.  HbA1C: Hgb A1c MFr Bld  Date/Time Value Ref Range Status  09/14/2018 05:51 PM 5.4 4.6 - 6.5 % Final    Comment:    Glycemic Control Guidelines for People with Diabetes:Non Diabetic:  <6%Goal of Therapy: <7%Additional Action Suggested:  >8%   06/19/2017 11:35 AM 5.5 4.6 - 6.5 % Final    Comment:    Glycemic Control Guidelines for People with Diabetes:Non Diabetic:  <6%Goal of Therapy: <7%Additional Action Suggested:  >8%     CBG: Recent Labs  Lab 04/21/21 2003  GLUCAP 137*    Review of Systems:   Please see the history of present illness. All other systems reviewed and are negative    Past Medical History:  She,  has a past  medical history of Bursitis of left hip (10/26/2012), Chronic low back pain, Chronic respiratory failure with hypoxia ([place]) (10/21/2016), Coronary artery calcification (07/03/2016), DDD (degenerative disc disease), Degenerative spondylolisthesis, Emphysema lung ([place]), GERD (gastroesophageal reflux disease), Hiatal hernia, Hyperlipidemia, Hypertension, Hypothyroidism (10/28/2015), Pain in joint, lower leg (10/26/2012), Pneumonia, Pulmonary hypertension ([place]), Sarcoidosis, and Tremors of nervous system.   Surgical History:   Past Surgical History:  Procedure Laterality Date   ANTERIOR LAT LUMBAR FUSION Left 08/29/2014   Procedure: EXTREME LEFT LATERAL INTERBODY FUSION LUMBAR TWO-THREE LATERAL PLATE;  Surgeon: Charlie Pitter, MD;  Location: [facility];  Service: Neurosurgery;  Laterality: Left;   BACK SURGERY  01/15/09   L3-4 and L4-5 decompressive laminectomy with bilateral L3, L4, and L5 decompressive foraminotomies, more than it would be required for simple interbody fusion alone.   BACK SURGERY  01/15/09   L3-4 and L4-5 posterior lumbar interbody fusion utilizing tanget interbody allograft wedge, Telamon interbody PEEk cage, and local autografting.   BACK SURGERY  01/15/09   L3, L4, and L5 posterolateral arthrodesis using segmental pedicle screw fixation and local autografting.   CARDIAC CATHETERIZATION     CARPAL TUNNEL RELEASE Bilateral    COLONOSCOPY     ESOPHAGOGASTRODUODENOSCOPY     EYE SURGERY     lens implant   JOINT REPLACEMENT  09/13/09   Right Hip, Dr. Alvan Dame   KYPHOPLASTY N/A 09/14/2014   Procedure: Lumbar Two Kyphoplasty/Vertebroplasty;  Surgeon: Charlie Pitter, MD;  Location: [facility];  Service: Neurosurgery;  Laterality: N/A;  Lumbar Two Kyphoplasty/Vertebroplasty   RIGHT HEART CATH N/A 11/19/2017   Procedure: RIGHT HEART CATH;  Surgeon: Larey Dresser, MD;  Location: [facility];  Service: Cardiovascular;  Laterality: N/A;   RIGHT HEART CATH N/A 05/27/2019   Procedure: RIGHT  HEART CATH;  Surgeon: Larey Dresser, MD;  Location: [place];  Service: Cardiovascular;  Laterality: N/A;   RIGHT HEART CATH N/A 01/07/2021   Procedure: RIGHT HEART CATH;  Surgeon: Larey Dresser, MD;  Location: [facility]  [facility];  Service: Cardiovascular;  Laterality: N/A;   THYROIDECTOMY     TOTAL HIP ARTHROPLASTY Left 06/28/2013   Procedure: LEFT TOTAL  HIP ARTHROPLASTY ANTERIOR APPROACH;  Surgeon: Mauri Pole, MD;  Location: [facility];  Service: Orthopedics;  Laterality: Left;     Social History:   reports that she quit smoking about 26 years ago. Her smoking use included cigarettes. She has a 16.00 pack-year smoking history. She has never used smokeless tobacco. She reports that she does not drink alcohol and does not use drugs.   Family History:  Her family history includes Asthma in her maternal grandmother; Cancer (age of onset: 29) in her mother; Heart attack in her mother; Heart disease in her mother; Hyperlipidemia in her mother; Hypertension in her mother.   Allergies Allergies  Allergen Reactions   Lipitor [Atorvastatin] Swelling   Penicillins Swelling    Has patient had a PCN reaction causing immediate rash, facial/tongue/throat swelling, SOB or lightheadedness with hypotension: Yes Has patient had a PCN reaction causing severe rash involving mucus membranes or skin necrosis: No Has patient had a PCN reaction that required hospitalization: No Has patient had a PCN reaction occurring within the last 10 years: No If all of the above answers are "NO", then may proceed with Cephalosporin use.    Metronidazole Swelling   Pregabalin Other (See Comments)    REACTION: Somnolence and dizziness   Simvastatin Other (See Comments)    Leg pain     Home Medications  Prior to Admission medications   Medication Sig Start Date End Date Taking? Authorizing Provider  acetaminophen (TYLENOL) 500 MG tablet Take 1,000 mg by mouth every 6 (six) hours as needed for moderate  pain.    [provider]  albuterol (PROVENTIL) (2.5 MG/3ML) 0.083% nebulizer solution Take 2.5 mg by nebulization every 6 (six) hours as needed for wheezing or shortness of breath.    [provider]  aspirin EC 81 MG tablet Take 81 mg by mouth at bedtime.     [provider]  atenolol (TENORMIN) 25 MG tablet TAKE 1 TABLET (25 MG TOTAL) BY MOUTH AT BEDTIME. TAKE THE 50 MG ATENOLOL IN AM AND THE 25 MG IN PM 08/15/20   Mosie Lukes, MD  atenolol (TENORMIN) 50 MG tablet TAKE 1 TABLET BY MOUTH EVERY DAY 02/01/21   Mosie Lukes, MD  BESIVANCE 0.6 % SUSP Place 1 drop into the left eye See admin instructions. 1 drop into left eye QID for 2 days after eye injection 12/18/14   [provider]  calcium carbonate (OSCAL) 1500 (600 Ca) MG TABS tablet Take 1,500 mg by mouth daily with breakfast.     [provider]  Cholecalciferol (VITAMIN D) 2000 UNITS tablet Take 2,000 Units by mouth every evening.     [provider]  CINNAMON PO Take 2,000 mg by mouth daily.    [provider]  citalopram (CELEXA) 10 MG tablet Take 1 tablet (10 mg total) by mouth daily. 09/14/20   Saguier, Percell Miller, PA-C  Coenzyme Q10 200 MG capsule Take 200 mg by mouth at bedtime.     [provider]  Fexofenadine HCl (ALLEGRA ALLERGY PO) Take 1 tablet by mouth daily.    [provider]  Fluticasone-Umeclidin-Vilant (TRELEGY ELLIPTA) 100-62.5-25 MCG/INH AEPB Inhale 1 Inhaler into the lungs daily. 02/27/21   Lanyiah Brix, Bonna Gains, MD  furosemide (LASIX) 40 MG tablet Take 1 tablet (40 mg total) by mouth daily. 03/21/21   Zyionna Pesce,  Bonna Gains, MD  gabapentin (NEURONTIN) 300 MG capsule Take 1 capsule (300 mg total) by mouth at bedtime. 09/14/20   Saguier, Percell Miller, PA-C  hydrochlorothiazide (MICROZIDE) 12.5 MG capsule TAKE 1 CAPSULE BY MOUTH EVERY DAY 02/01/21   Larey Dresser, MD  ipratropium-albuterol (DUONEB) 0.5-2.5 (3) MG/3ML SOLN Take 3 mLs by nebulization every 6  (six) hours. 03/06/21   Jamieson Lisa, Bonna Gains, MD  ketoconazole (NIZORAL) 2 % shampoo Apply 1 application topically daily as needed for irritation (scalp).    [provider]  levocetirizine (XYZAL) 5 MG tablet TAKE 1 TABLET BY MOUTH EVERY DAY IN THE EVENING 12/06/20   Saguier, Percell Miller, PA-C  levothyroxine (SYNTHROID) 112 MCG tablet TAKE 1 TABLET BY MOUTH EVERY DAY BEFORE BREAKFAST 04/15/21   Mosie Lukes, MD  Omega-3 Fatty Acids (FISH OIL) 1200 MG CAPS Take 1,200 mg by mouth daily.    [provider]  omeprazole (PRILOSEC) 40 MG capsule TAKE 1 CAPSULE BY MOUTH EVERY DAY 04/15/21   Mosie Lukes, MD  potassium chloride (KLOR-CON) 10 MEQ tablet Take 2 tablets (20 mEq total) by mouth daily. 02/27/21   Milford, Maricela Bo, FNP  rosuvastatin (CRESTOR) 20 MG tablet TAKE 1 TABLET BY MOUTH EVERY DAY Patient taking differently: Take 20 mg by mouth daily. 02/01/21   Mosie Lukes, MD  Selexipag 1600 MCG TABS Take 1 tablet (1,600 mcg total) by mouth 2 (two) times daily. 01/14/21   Damita Lack, MD  Spacer/Aero-Holding Chambers (AEROCHAMBER PLUS WITH MASK) inhaler Use as instructed 01/28/18   Debbe Odea, MD  tadalafil, PAH, (ADCIRCA) 20 MG tablet TAKE 2 TABLETS BY MOUTH EVERY DAY 10/16/20   Larey Dresser, MD  traMADol (ULTRAM) 50 MG tablet Take 1 tablet (50 mg total) by mouth every 6 (six) hours as needed for moderate pain or severe pain. 01/14/21 01/14/22  Amin, Jeanella Flattery, MD  vitamin B-12 (CYANOCOBALAMIN) 1000 MCG tablet Take 1,000 mcg by mouth daily.    [provider]     Critical care time:    Performed by: Lanier Clam  Total critical care time: 40 minutes  Critical care time was exclusive of separately billable procedures and treating other patients.  Critical care was necessary to treat or prevent imminent or life-threatening deterioration.  Critical care was time spent personally by me on the following activities: development of treatment plan with  patient and/or surrogate as well as nursing, discussions with consultants, evaluation of patient's response to treatment, examination of patient, obtaining history from patient or surrogate, ordering and performing treatments and interventions, ordering and review of laboratory studies, ordering and review of radiographic studies, pulse oximetry and re-evaluation of patient's condition.  Lanier Clam, MD  [facility] Personal contact information can be found on Amion  04/22/2021, 9:41 AM

## 2021-04-22 NOTE — Progress Notes (Signed)
Daily Progress Note   Patient Name: Kimberly Irwin       Date: 04/22/2021 DOB: 01-09-1946  Age: 75 y.o. MRN#: [id number] Attending Physician: Lanier Clam, MD Primary Care Physician: Mosie Lukes, MD Admit Date: 04/21/2021  Reason for Consultation/Follow-up: Goals of care.  Subjective: Received report from bedside RN. Levophed titrated off. Patient is on 10L currently, required BiPAP overnight.   I spoke with daughter [name] by phone and provided an update on patient's condition. Provided education and counseling at length on the philosophy and benefits of hospice care. Discussed that it offers a holistic approach to care in the setting of end-stage illness/disease, and is about supporting the patient where they are while allowing a natural course to occur. Hospice can provide personal care, support for the family, and help keep patient out of the hospital. [name] is familiar with hospice services because her dad was in hospice care at home before he died. She states it was a positive experience. She agrees that hospice care would be appropriate; however her mother has not been receptive to previous discussions.   I went to see patient at bedside. She expresses wanting to go home. She states she did want to come to the hospital in the first place.  I encouraged patient to consider hospice care, as it would support her goal of avoiding rehospitalization. Discussed they would be an extra layer of support. Patient is receptive to the discussion but indicates she is not ready for hospice care at his point.   Updated MOST form was completed. The following treatment decisions were outlined:  Cardiopulmonary Resuscitation: Do Not Attempt Resuscitation (DNR/No CPR)  Medical Interventions: Limited  Additional Interventions: Use medical treatment, IV fluids and cardiac monitoring as indicated, DO NOT USE intubation or mechanical ventilation. May consider use of less invasive airway support such as BiPAP or CPAP. Also provide comfort measures. Transfer to the hospital if indicated. Avoid intensive care.   Antibiotics: Antibiotics if indicated  IV Fluids: IV fluids for a defined trial period    Feeding Tube: No feeding tube     Length of Stay: 1  Current Medications: Scheduled Meds:   arformoterol  15 mcg Nebulization BID   budesonide (PULMICORT) nebulizer solution  0.25 mg Nebulization BID   Chlorhexidine Gluconate Cloth  6 each Topical Q0600  enoxaparin (LOVENOX) injection  40 mg Subcutaneous Q24H   feeding supplement  237 mL Oral Q24H   Gerhardt's butt cream   Topical BID   levothyroxine  112 mcg Oral Q0600   mouth rinse  15 mL Mouth Rinse BID   pantoprazole  40 mg Oral Daily   revefenacin  175 mcg Nebulization Daily   Selexipag  1,600 mcg Oral BID   And   Selexipag  200 mcg Oral BID   tadalafil  40 mg Oral Daily     PRN Meds: docusate sodium, ipratropium-albuterol, polyethylene glycol  Physical Exam Vitals reviewed.  Constitutional:      General: She is not in acute distress.    Comments: Chronically ill-appearing  Pulmonary:     Effort: Pulmonary effort is normal.  Neurological:     Mental Status: She is alert and oriented to person, place, and time.     Motor: Weakness and tremor present.            Vital Signs: BP 104/64   Pulse 75   Temp (!) 97.5 F (36.4 C) (Axillary)   Resp 19   Ht 5' 3.5" (1.613 m)   Wt 66.4 kg   SpO2 (!) 89%   BMI 25.52 kg/m  SpO2: SpO2: (!) 89 % O2 Device: O2 Device: Nasal Cannula O2 Flow Rate: O2 Flow Rate (L/min): 10 L/min  Intake/output summary:  Intake/Output Summary (Last 24 hours) at 04/22/2021 1452 Last data filed at 04/22/2021 1400 Gross per 24 hour  Intake 742.17 ml  Output 850 ml  Net -107.83 ml   LBM: Last BM  Date: 04/21/21 Baseline Weight: Weight: 65.4 kg Most recent weight: Weight: 66.4 kg       Palliative Assessment/Data: PPS 30-40%      Palliative Care Assessment & Plan   HPI/Patient Profile: 75 y.o. female  with past medical history of sarcoidosis (no active disease but remains on steroids due to shortness of breath), chronic hypoxic respiratory failure requiring 8L nasal cannula at baseline, emphysema, pulmonary hypertension, and HFpEF. She presented to the emergency department from home on 04/21/21 with altered mental status and progressive weakness per family. Daughter called EMS reporting that patient's pulse oximetry was unreadable. On arrival to the ED patient was hypotensive and hypoxic with spo2 65% on home 8L. Placed in BiPAP with improvement in oxygenation. However BiPAP was removed when ABG revealed respiratory alkalosis. Chest x-ray consistent with pulmonary edema. Started on vasopressor therapy. Patient also found to have a cardiac arrhythmia concerning for v-tach, however later determined to be cardiac artifact secondary to essential tremor. Admitted to PCCM.   Assessment: - acute on chronic hypoxic respiratory failure - emphysema - sarcoidosis on chronic steroids - pulmonary hypertension - hypotension - chronic heat failure with preserved EF - essential tremor - hypokalemia  Recommendations/Plan: Continue current care MOST form completed - copy made to scan into Vynca and original placed on shadow chart Recommended hospice care at home - patient is not ready PMT will continue to support as needed  Goals of Care and Additional Recommendations: Limitations on Scope of Treatment: Avoid Hospitalization, No Artificial Feeding, and No Tracheostomy  Code Status: DNR/DNI  Prognosis:  < 6 months would not be surprising  Discharge Planning: Home   Thank you for allowing the Palliative Medicine Team to assist in the care of this patient.   Total Time 65 minutes  Prolonged Time Billed  yes       Greater than 50%  of this time was  spent counseling and coordinating care related to the above assessment and plan.  Lavena Bullion, NP  Please contact Palliative Medicine Team phone at [phone] for questions and concerns.

## 2021-04-22 NOTE — Progress Notes (Signed)
[facility] Progress Note Patient Name: Kimberly Irwin DOB: [birth year] MRN: [id number]   Date of Service  04/22/2021  HPI/Events of Note  Unable to take PO KCL  eICU Interventions  Changed to IV     Intervention Category Minor Interventions: Electrolytes abnormality - evaluation and management  Tilden Dome 04/22/2021, 7:34 PM

## 2021-04-22 NOTE — Progress Notes (Signed)
Pt found on 8-10L on regular [place] 02 device. Pt placed on 10L HFNC per 02 demand. Pt tolerating well

## 2021-04-22 NOTE — Progress Notes (Addendum)
Initial Nutrition Assessment  DOCUMENTATION CODES:   Non-severe (moderate) malnutrition in context of chronic illness  INTERVENTION:   Liberalize diet to regular. Offer Ensure Enlive/Plus once daily, mixed with ice cream. Each supplement provides 350 kcal and 20 grams of protein, ice cream will provide additional calories and protein.  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  GOAL:   Patient will meet greater than or equal to 90% of their needs  MONITOR:   PO intake, Supplement acceptance, Labs  REASON FOR ASSESSMENT:   Malnutrition Screening Tool    ASSESSMENT:   75 yo female admitted with acute on chronic respiratory failure, pulmonary edema. PMH includes sarcoidosis, chronic hypoxic respiratory failure, on 8 L oxygen at home via nasal cannula, emphysema, pulmonary HTN, HF.  Discussed patient in ICU rounds and with RN today. Receiving IV Lasix today for volume overload. She has been weaned to 10 L oxygen via nasal cannula.   Patient reports that she does not like the hospital food and has been eating poorly since admission. PTA she has had a poor appetite for 6 months and intake has been less than usual. Her daughter prepares her meals, but she just hasn't had a good appetite. At home, patient ambulates with a walker. Six months ago, she reports weighing 175 lbs. She drinks Ensure at home (chocolate flavor) mixed with vanilla ice cream and says she will drink it if we make it that way. Discussed with RN. Will send an afternoon snack of ice cream daily for RN to mix with the Ensure. Will also add magic cups with meals and liberalize diet to regular.   Labs reviewed. Na 134, K 3.4  Medications reviewed and include Protonix.  Weight  history reviewed. Over the past 5 months, patient has lost 11% of her usual weight.   Patient meets criteria for moderate malnutrition, given 11% weight loss within 6 months and mild-moderate depletion of muscle mass.  NUTRITION - FOCUSED PHYSICAL EXAM:  Flowsheet Row Most Recent Value  Orbital Region No depletion  Upper Arm Region Mild depletion  Thoracic and Lumbar Region No depletion  Buccal Region No depletion  Temple Region No depletion  Clavicle Bone Region Mild depletion  Clavicle and Acromion Bone Region Mild depletion  Scapular Bone Region Unable to assess  Dorsal Hand Moderate depletion  Patellar Region No depletion  Anterior Thigh Region No depletion  Posterior Calf Region Mild depletion  Edema (RD Assessment) Mild  Hair Reviewed  Eyes Reviewed  Mouth Reviewed  Skin Reviewed  Nails Reviewed       Diet Order:   Diet Order             Diet regular Room service appropriate? Yes; Fluid consistency: Thin  Diet effective now                   EDUCATION NEEDS:   Education needs have been addressed  Skin:  Skin Assessment: Reviewed RN Assessment (MASD to rectum)  Last BM:  9/18  Height:   Ht Readings from Last 1 Encounters:  04/21/21 5' 3.5" (1.613 m)    Weight:   Wt Readings from Last 1 Encounters:  04/22/21 66.4 kg    BMI:  Body mass index is 25.52 kg/m.  Estimated Nutritional Needs:  Kcal:  1650-1850  Protein:  85-95 gm  Fluid:  1.7-1.9 L    Lucas Mallow, RD, LDN, CNSC Please refer to Amion for contact information.

## 2021-04-23 DIAGNOSIS — E440 Moderate protein-calorie malnutrition: Secondary | ICD-10-CM | POA: Insufficient documentation

## 2021-04-23 DIAGNOSIS — R0902 Hypoxemia: Secondary | ICD-10-CM | POA: Diagnosis not present

## 2021-04-23 LAB — MAGNESIUM: Magnesium: 1.9 mg/dL (ref 1.7–2.4)

## 2021-04-23 LAB — BASIC METABOLIC PANEL
Anion gap: 9 (ref 5–15)
Anion gap: 11 (ref 5–15)
BUN: 14 mg/dL (ref 8–23)
BUN: 13 mg/dL (ref 8–23)
CO2: 24 mmol/L (ref 22–32)
CO2: 25 mmol/L (ref 22–32)
Calcium: 9.1 mg/dL (ref 8.9–10.3)
Calcium: 9.1 mg/dL (ref 8.9–10.3)
Chloride: 101 mmol/L (ref 98–111)
Chloride: 101 mmol/L (ref 98–111)
Creatinine, Ser: 1.21 mg/dL — ABNORMAL HIGH (ref 0.44–1.00)
Creatinine, Ser: 1.26 mg/dL — ABNORMAL HIGH (ref 0.44–1.00)
GFR, Estimated: 46 mL/min — ABNORMAL LOW (ref >60)
GFR, Estimated: 44 mL/min — ABNORMAL LOW (ref >60)
Glucose, Bld: 97 mg/dL (ref 70–99)
Glucose, Bld: 104 mg/dL — ABNORMAL HIGH (ref 70–99)
Potassium: 4.4 mmol/L (ref 3.5–5.1)
Potassium: 3.9 mmol/L (ref 3.5–5.1)
Sodium: 134 mmol/L — ABNORMAL LOW (ref 135–145)
Sodium: 137 mmol/L (ref 135–145)

## 2021-04-23 LAB — CBC
HCT: 34.8 % — ABNORMAL LOW (ref 36.0–46.0)
Hemoglobin: 11.6 g/dL — ABNORMAL LOW (ref 12.0–15.0)
MCH: 30.1 pg (ref 26.0–34.0)
MCHC: 33.3 g/dL (ref 30.0–36.0)
MCV: 90.4 fL (ref 80.0–100.0)
Platelets: 197 10*3/uL (ref 150–400)
RBC: 3.85 MIL/uL — ABNORMAL LOW (ref 3.87–5.11)
RDW: 14.3 % (ref 11.5–15.5)
WBC: 8.2 10*3/uL (ref 4.0–10.5)
nRBC: 0 % (ref 0.0–0.2)

## 2021-04-23 MED ORDER — FUROSEMIDE 10 MG/ML IJ SOLN
40.0000 mg | Freq: Once | INTRAMUSCULAR | Status: AC
  Administered 2021-04-23: 40 mg via INTRAVENOUS
  Filled 2021-04-23: qty 4

## 2021-04-23 MED ORDER — POTASSIUM CHLORIDE CRYS ER 20 MEQ PO TBCR
20.0000 meq | EXTENDED_RELEASE_TABLET | Freq: Once | ORAL | Status: AC
  Administered 2021-04-23: 20 meq via ORAL
  Filled 2021-04-23: qty 1

## 2021-04-23 MED ORDER — MAGNESIUM SULFATE 2 GM/50ML IV SOLN
2.0000 g | Freq: Once | INTRAVENOUS | Status: AC
  Administered 2021-04-23: 2 g via INTRAVENOUS
  Filled 2021-04-23: qty 50

## 2021-04-23 NOTE — Progress Notes (Signed)
NAME:  Kimberly Irwin, MRN:  [id number], DOB:  11/11/1945, LOS: 2 ADMISSION DATE:  04/21/2021, CONSULTATION DATE: 04/21/2021 REFERRING MD:  Dr. Kathrynn Humble, CHIEF COMPLAINT: Hypoxia and hypotension  History of Present Illness:  Kimberly Irwin is a 75 y.o. female with an extensive past medical history significant for but not limited to sarcoidosis (no active disease but remains on steroids due to SOB), chronic hypoxic respiratory failure requiring 8 L nasal cannula at baseline, emphysema, pulmonary hypertension, and HFpEF who presented to the emergency department via EMS today due to complaints of altered behavior and progressive weakness per family.  Daughter states patient had episode of full body shaking but did not lose consciousness or lose function of bowel or bladder.  Patient also had signs of hypoxia with cyanosis observed per family.  On ED arrival patient was seen hypotensive and hypoxic with SPO2 65 on home 8 L nasal cannula.  Patient was placed on BiPAP therapy with improvement in oxygenation.  However BiPAP removed when ABG revealed respiratory alkalosis with pH 7.64, CO2 21.9, bicarb 23.8.  Other pertinent lab work includes potassium 3.4, lactic acid 2.1, WBC 11.5.  Chest x-ray consistent with pulmonary edema.  Additionally while in the emergency department patient was seen with cardiac arrhythmia concerning for V. tach and patient was started on amiodarone drip with electrolyte replacement as patient presented with MOST form explicitly requesting no cardioversion.  Cardiology has been consulted and felt patient's arrhythmia was likely artifact from severe essential tremor and amiodarone drip was stopped.Marland Kitchen   PCCM consulted for further management and admission.  Pertinent  Medical History  Significant for but not limited to sarcoidosis (no active disease but remains on steroids due to SOB), chronic hypoxic respiratory failure requiring 8 L nasal cannula at baseline, emphysema, pulmonary  hypertension, and HFpEF  Significant Hospital Events:  9/19 admitted with acute on chronic respiratory failure likely due to volume overload, weaned to 10L [place], given IV lasix awaiting response  Interim History / Subjective:  Still more hypoxemic than baseline, she feels breathing no different or worse than normal.   Objective   Blood pressure 120/76, pulse 78, temperature 97.7 F (36.5 C), temperature source Axillary, resp. rate 18, height 5' 3.5" (1.613 m), weight 63.5 kg, SpO2 92 %.        Intake/Output Summary (Last 24 hours) at 04/23/2021 1034 Last data filed at 04/23/2021 2409 Gross per 24 hour  Intake 1110 ml  Output 800 ml  Net 310 ml    Filed Weights   04/21/21 2000 04/22/21 0500 04/23/21 0500  Weight: 65.4 kg 66.4 kg 63.5 kg    Examination: General: Acute on chronic ill-appearing elderly female lying in bed all in no acute distress HEENT: [place]/AT, MM pink/moist, PERRL, significant essential tremor mainly seen in jaw extending to upper torso Neuro: Alert and oriented x3, essential tremor CV: s1s2 regular rate and rhythm, no murmur, rubs, or gallops,  PULM: Clear to auscultation bilaterally, diminished bases, no increased work of breathing, on  currently GI: soft, bowel sounds active in all 4 quadrants, non-tender, non-distended Extremities: warm/dry, no edema  Skin: no rashes or lesions  Resolved Hospital Problem list     Assessment & Plan:  Acute on chronic hypoxic respiratory failure -On 8 L nasal cannula at baseline Emphysema Sarcoidosis on chronic steroid 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  Per daughter patient's baseline blood pressure ranges in upper 28M systolically. --MAP goal 60  Chronic heart failure with preserved EF Hypertension Concern for new arrhythmia including V. Tach --Patient was seen with change in rhythm in emergency department with concern for new arrhythmia however this appears to be artifact from significant essential tremor.  Cardiology stopped amiodarone drip.  Of note however patient was not want cardioversion if arrhythmia does develop. --Strict intake and output  --Daily weight to assess volume status --Lasix IV BID  Essential tremor: At baseline --Supportive care  Hypokalemia --improved after supplementation  Goals of care Per chart review and discussion with daughter patient has had extensive advanced care planning completed with established DNI/DNR and MOST form.  Palliative care has been consulted to help family further determine acute goals of care including but not limited to desire for aggressive measures including vasopressor support.  We will continue to coordinate with palliative care and family to help patient determine best plan of care.   Best Practice (right click and "Reselect all SmartList Selections" daily)   Diet/type: Regular consistency (see orders) DVT prophylaxis: LMWH GI prophylaxis: N/A Lines: N/A Foley:  N/A Code Status:  DNR Last date of multidisciplinary goals of care discussion: Discussed plan of care at bedside with daughter and patient day of admission 9/18   Labs   CBC: Recent Labs  Lab 04/21/21 1309 04/21/21 1317 04/21/21 1359 04/21/21 1613 04/22/21 0014 04/22/21 0042 04/23/21 0355  WBC 11.5*  --   --  9.0  --  6.9 8.2  NEUTROABS 9.2*  --   --   --   --   --   --   HGB 12.0   < > 9.2* 11.4* 9.5* 10.9* 11.6*  HCT 36.5   < > 27.0* 34.1* 28.0* 31.8* 34.8*  MCV 90.8  --   --  90.5  --  89.1 90.4  PLT 210  --   --  183  --  177 197   < > = values in this interval not displayed.     Basic  Metabolic Panel: Recent Labs  Lab 04/21/21 1309 04/21/21 1317 04/21/21 1359 04/21/21 1613 04/22/21 0014 04/22/21 0042 04/22/21 1635 04/23/21 0355  NA 137 139 134*  --  137 134* 139 134*  K 3.4* 3.3* 3.2*  --  3.3* 3.4* 3.3* 4.4  CL 104 103  --   --   --  100 100 101  CO2 21*  --   --   --   --  _0 GLUCOSE 108* 101*  --   --   --  89 120* 97  BUN 21 21  --   --   --  _1 CREATININE 1.51* 1.50*  --  1.39*  --  1.39* 1.34* 1.21*  CALCIUM 8.3*  --   --   --   --  8.5* 9.2 9.1  MG  --   --   --   --   --  2.1  --  1.9  PHOS  --   --   --   --   --  3.1  --   --     GFR: Estimated Creatinine Clearance: 32.9 mL/min (A) (by C-G formula based on SCr  of 1.21 mg/dL (H)). Recent Labs  Lab 04/21/21 1309 04/21/21 1613 04/21/21 1700 04/21/21 2047 04/22/21 0042 04/23/21 0355  PROCALCITON  --   --  <0.10  --   --   --   WBC 11.5* 9.0  --   --  6.9 8.2  LATICACIDVEN 2.1*  --   --  1.5  --   --      Liver Function Tests: Recent Labs  Lab 04/21/21 1309  AST 17  ALT 8  ALKPHOS 40  BILITOT 0.6  PROT 5.5*  ALBUMIN 2.6*    No results for input(s): LIPASE, AMYLASE in the last 168 hours. No results for input(s): AMMONIA in the last 168 hours.  ABG    Component Value Date/Time   PHART 7.441 04/22/2021 0014   PCO2ART 34.4 04/22/2021 0014   PO2ART 58 (L) 04/22/2021 0014   HCO3 23.5 04/22/2021 0014   TCO2 25 04/22/2021 0014   O2SAT 91.0 04/22/2021 0014      Coagulation Profile: No results for input(s): INR, PROTIME in the last 168 hours.  Cardiac Enzymes: No results for input(s): CKTOTAL, CKMB, CKMBINDEX, TROPONINI in the last 168 hours.  HbA1C: Hgb A1c MFr Bld  Date/Time Value Ref Range Status  09/14/2018 05:51 PM 5.4 4.6 - 6.5 % Final    Comment:    Glycemic Control Guidelines for People with Diabetes:Non Diabetic:  <6%Goal of Therapy: <7%Additional Action Suggested:  >8%   06/19/2017 11:35 AM 5.5 4.6 - 6.5 % Final    Comment:    Glycemic Control  Guidelines for People with Diabetes:Non Diabetic:  <6%Goal of Therapy: <7%Additional Action Suggested:  >8%     CBG: Recent Labs  Lab 04/21/21 2003 04/22/21 1528  GLUCAP 137* 119*     Review of Systems:   Please see the history of present illness. All other systems reviewed and are negative    Past Medical History:  She,  has a past medical history of Bursitis of left hip (10/26/2012), Chronic low back pain, Chronic respiratory failure with hypoxia ([place]) (10/21/2016), Coronary artery calcification (07/03/2016), DDD (degenerative disc disease), Degenerative spondylolisthesis, Emphysema lung ([place]), GERD (gastroesophageal reflux disease), Hiatal hernia, Hyperlipidemia, Hypertension, Hypothyroidism (10/28/2015), Pain in joint, lower leg (10/26/2012), Pneumonia, Pulmonary hypertension ([place]), Sarcoidosis, and Tremors of nervous system.   Surgical History:   Past Surgical History:  Procedure Laterality Date   ANTERIOR LAT LUMBAR FUSION Left 08/29/2014   Procedure: EXTREME LEFT LATERAL INTERBODY FUSION LUMBAR TWO-THREE LATERAL PLATE;  Surgeon: Charlie Pitter, MD;  Location: [facility];  Service: Neurosurgery;  Laterality: Left;   BACK SURGERY  01/15/09   L3-4 and L4-5 decompressive laminectomy with bilateral L3, L4, and L5 decompressive foraminotomies, more than it would be required for simple interbody fusion alone.   BACK SURGERY  01/15/09   L3-4 and L4-5 posterior lumbar interbody fusion utilizing tanget interbody allograft wedge, [place] interbody PEEk cage, and local autografting.   BACK SURGERY  01/15/09   L3, L4, and L5 posterolateral arthrodesis using segmental pedicle screw fixation and local autografting.   CARDIAC CATHETERIZATION     CARPAL TUNNEL RELEASE Bilateral    COLONOSCOPY     ESOPHAGOGASTRODUODENOSCOPY     EYE SURGERY     lens implant   JOINT REPLACEMENT  09/13/09   Right Hip, Dr. Alvan Dame   KYPHOPLASTY N/A 09/14/2014   Procedure: Lumbar Two Kyphoplasty/Vertebroplasty;  Surgeon:  Charlie Pitter, MD;  Location: [facility];  Service: Neurosurgery;  Laterality: N/A;  Lumbar Two  Kyphoplasty/Vertebroplasty   RIGHT HEART CATH N/A 11/19/2017   Procedure: RIGHT HEART CATH;  Surgeon: Larey Dresser, MD;  Location: [facility];  Service: Cardiovascular;  Laterality: N/A;   RIGHT HEART CATH N/A 05/27/2019   Procedure: RIGHT HEART CATH;  Surgeon: Larey Dresser, MD;  Location: [facility];  Service: Cardiovascular;  Laterality: N/A;   RIGHT HEART CATH N/A 01/07/2021   Procedure: RIGHT HEART CATH;  Surgeon: Larey Dresser, MD;  Location: [facility];  Service: Cardiovascular;  Laterality: N/A;   THYROIDECTOMY     TOTAL HIP ARTHROPLASTY Left 06/28/2013   Procedure: LEFT TOTAL  HIP ARTHROPLASTY ANTERIOR APPROACH;  Surgeon: Mauri Pole, MD;  Location: [facility];  Service: Orthopedics;  Laterality: Left;     Social History:   reports that she quit smoking about 26 years ago. Her smoking use included cigarettes. She has a 16.00 pack-year smoking history. She has never used smokeless tobacco. She reports that she does not drink alcohol and does not use drugs.   Family History:  Her family history includes Asthma in her maternal grandmother; Cancer (age of onset: 18) in her mother; Heart attack in her mother; Heart disease in her mother; Hyperlipidemia in her mother; Hypertension in her mother.   Allergies Allergies  Allergen Reactions   Lipitor [Atorvastatin] Swelling   Penicillins Swelling    Has patient had a PCN reaction causing immediate rash, facial/tongue/throat swelling, SOB or lightheadedness with hypotension: Yes Has patient had a PCN reaction causing severe rash involving mucus membranes or skin necrosis: No Has patient had a PCN reaction that required hospitalization: No Has patient had a PCN reaction occurring within the last 10 years: No If all of the above answers are "NO", then may proceed with Cephalosporin use.    Metronidazole Swelling    Pregabalin Other (See Comments)    REACTION: Somnolence and dizziness   Simvastatin Other (See Comments)    Leg pain     Home Medications  Prior to Admission medications   Medication Sig Start Date End Date Taking? Authorizing Provider  acetaminophen (TYLENOL) 500 MG tablet Take 1,000 mg by mouth every 6 (six) hours as needed for moderate pain.    [provider]  albuterol (PROVENTIL) (2.5 MG/3ML) 0.083% nebulizer solution Take 2.5 mg by nebulization every 6 (six) hours as needed for wheezing or shortness of breath.    [provider]  aspirin EC 81 MG tablet Take 81 mg by mouth at bedtime.     [provider]  atenolol (TENORMIN) 25 MG tablet TAKE 1 TABLET (25 MG TOTAL) BY MOUTH AT BEDTIME. TAKE THE 50 MG ATENOLOL IN AM AND THE 25 MG IN PM 08/15/20   Mosie Lukes, MD  atenolol (TENORMIN) 50 MG tablet TAKE 1 TABLET BY MOUTH EVERY DAY 02/01/21   Mosie Lukes, MD  BESIVANCE 0.6 % SUSP Place 1 drop into the left eye See admin instructions. 1 drop into left eye QID for 2 days after eye injection 12/18/14   [provider]  calcium carbonate (OSCAL) 1500 (600 Ca) MG TABS tablet Take 1,500 mg by mouth daily with breakfast.     [provider]  Cholecalciferol (VITAMIN D) 2000 UNITS tablet Take 2,000 Units by mouth every evening.     [provider]  CINNAMON PO Take 2,000 mg by mouth daily.    [provider]  citalopram (CELEXA) 10 MG tablet Take 1 tablet (10 mg total)  by mouth daily. 09/14/20   Saguier, Percell Miller, PA-C  Coenzyme Q10 200 MG capsule Take 200 mg by mouth at bedtime.     [provider]  Fexofenadine HCl (ALLEGRA ALLERGY PO) Take 1 tablet by mouth daily.    [provider]  Fluticasone-Umeclidin-Vilant (TRELEGY ELLIPTA) 100-62.5-25 MCG/INH AEPB Inhale 1 Inhaler into the lungs daily. 02/27/21   Giliana Vantil, Bonna Gains, MD  furosemide (LASIX) 40 MG tablet Take 1 tablet (40 mg total) by mouth daily. 03/21/21    Aleli Navedo, Bonna Gains, MD  gabapentin (NEURONTIN) 300 MG capsule Take 1 capsule (300 mg total) by mouth at bedtime. 09/14/20   Saguier, Percell Miller, PA-C  hydrochlorothiazide (MICROZIDE) 12.5 MG capsule TAKE 1 CAPSULE BY MOUTH EVERY DAY 02/01/21   Larey Dresser, MD  ipratropium-albuterol (DUONEB) 0.5-2.5 (3) MG/3ML SOLN Take 3 mLs by nebulization every 6 (six) hours. 03/06/21   Anaston Koehn, Bonna Gains, MD  ketoconazole (NIZORAL) 2 % shampoo Apply 1 application topically daily as needed for irritation (scalp).    [provider]  levocetirizine (XYZAL) 5 MG tablet TAKE 1 TABLET BY MOUTH EVERY DAY IN THE EVENING 12/06/20   Saguier, Percell Miller, PA-C  levothyroxine (SYNTHROID) 112 MCG tablet TAKE 1 TABLET BY MOUTH EVERY DAY BEFORE BREAKFAST 04/15/21   Mosie Lukes, MD  Omega-3 Fatty Acids (FISH OIL) 1200 MG CAPS Take 1,200 mg by mouth daily.    [provider]  omeprazole (PRILOSEC) 40 MG capsule TAKE 1 CAPSULE BY MOUTH EVERY DAY 04/15/21   Mosie Lukes, MD  potassium chloride (KLOR-CON) 10 MEQ tablet Take 2 tablets (20 mEq total) by mouth daily. 02/27/21   [place], Maricela Bo, FNP  rosuvastatin (CRESTOR) 20 MG tablet TAKE 1 TABLET BY MOUTH EVERY DAY Patient taking differently: Take 20 mg by mouth daily. 02/01/21   Mosie Lukes, MD  Selexipag 1600 MCG TABS Take 1 tablet (1,600 mcg total) by mouth 2 (two) times daily. 01/14/21   Damita Lack, MD  Spacer/Aero-Holding Chambers (AEROCHAMBER PLUS WITH MASK) inhaler Use as instructed 01/28/18   Debbe Odea, MD  tadalafil, PAH, (ADCIRCA) 20 MG tablet TAKE 2 TABLETS BY MOUTH EVERY DAY 10/16/20   Larey Dresser, MD  traMADol (ULTRAM) 50 MG tablet Take 1 tablet (50 mg total) by mouth every 6 (six) hours as needed for moderate pain or severe pain. 01/14/21 01/14/22  Amin, Jeanella Flattery, MD  vitamin B-12 (CYANOCOBALAMIN) 1000 MCG tablet Take 1,000 mcg by mouth daily.    [provider]     Critical care time:    Performed by: Lanier Clam  Total critical care time: 32 minutes  Critical care time was exclusive of separately billable procedures and treating other patients.  Critical care was necessary to treat or prevent imminent or life-threatening deterioration.  Critical care was time spent personally by me on the following activities: development of treatment plan with patient and/or surrogate as well as nursing, discussions with consultants, evaluation of patient's response to treatment, examination of patient, obtaining history from patient or surrogate, ordering and performing treatments and interventions, ordering and review of laboratory studies, ordering and review of radiographic studies, pulse oximetry and re-evaluation of patient's condition.  Lanier Clam, MD  [facility] Personal contact information can be found on Amion  04/23/2021, 10:34 AM

## 2021-04-23 NOTE — Progress Notes (Signed)
Pharmacy Electrolyte Replacement  Recent Labs:  Recent Labs    04/22/21 0042 04/22/21 1635 04/23/21 0355  K 3.4*   < > 4.4  MG 2.1  --  1.9  PHOS 3.1  --   --   CREATININE 1.39*   < > 1.21*   < > = values in this interval not displayed.    Low Critical Values (K </= 2.5, Phos </= 1, Mg </= 1) Present: None  MD Contacted: Dr. Silas Flood  Plan: Magnesium 2g IV x1 per protocol. Follow-up Magnesium level in AM.   Sloan Leiter, PharmD, BCPS, BCCCP Clinical Pharmacist Please refer to [facility] for [place] numbers 04/23/2021, 7:53 AM

## 2021-04-24 ENCOUNTER — Inpatient Hospital Stay (HOSPITAL_COMMUNITY): Payer: Medicare Other

## 2021-04-24 DIAGNOSIS — R609 Edema, unspecified: Secondary | ICD-10-CM

## 2021-04-24 DIAGNOSIS — R0902 Hypoxemia: Secondary | ICD-10-CM | POA: Diagnosis not present

## 2021-04-24 LAB — MAGNESIUM: Magnesium: 1.8 mg/dL (ref 1.7–2.4)

## 2021-04-24 LAB — BASIC METABOLIC PANEL WITH GFR
Anion gap: 10 (ref 5–15)
BUN: 11 mg/dL (ref 8–23)
CO2: 27 mmol/L (ref 22–32)
Calcium: 9 mg/dL (ref 8.9–10.3)
Chloride: 101 mmol/L (ref 98–111)
Creatinine, Ser: 1.32 mg/dL — ABNORMAL HIGH (ref 0.44–1.00)
GFR, Estimated: 42 mL/min — ABNORMAL LOW
Glucose, Bld: 95 mg/dL (ref 70–99)
Potassium: 3.6 mmol/L (ref 3.5–5.1)
Sodium: 138 mmol/L (ref 135–145)

## 2021-04-24 LAB — CBC
HCT: 35.8 % — ABNORMAL LOW (ref 36.0–46.0)
Hemoglobin: 12 g/dL (ref 12.0–15.0)
MCH: 29.9 pg (ref 26.0–34.0)
MCHC: 33.5 g/dL (ref 30.0–36.0)
MCV: 89.3 fL (ref 80.0–100.0)
Platelets: 211 10*3/uL (ref 150–400)
RBC: 4.01 MIL/uL (ref 3.87–5.11)
RDW: 14.3 % (ref 11.5–15.5)
WBC: 7.6 10*3/uL (ref 4.0–10.5)
nRBC: 0 % (ref 0.0–0.2)

## 2021-04-24 LAB — GLUCOSE, CAPILLARY
Glucose-Capillary: 111 mg/dL — ABNORMAL HIGH (ref 70–99)
Glucose-Capillary: 107 mg/dL — ABNORMAL HIGH (ref 70–99)

## 2021-04-24 LAB — CULTURE, BLOOD (ROUTINE X 2): Special Requests: ADEQUATE

## 2021-04-24 LAB — POTASSIUM: Potassium: 4.1 mmol/L (ref 3.5–5.1)

## 2021-04-24 MED ORDER — FUROSEMIDE 10 MG/ML IJ SOLN
40.0000 mg | Freq: Four times a day (QID) | INTRAMUSCULAR | Status: AC
Start: 2021-04-24 — End: 2021-04-24
  Administered 2021-04-24 (×2): 40 mg via INTRAVENOUS
  Filled 2021-04-24 (×2): qty 4

## 2021-04-24 MED ORDER — FUROSEMIDE 10 MG/ML IJ SOLN: 40.0000 mg | Freq: Once | INTRAMUSCULAR | Status: DC

## 2021-04-24 MED ORDER — POTASSIUM CHLORIDE 10 MEQ/100ML IV SOLN
10.0000 meq | INTRAVENOUS | Status: AC
  Administered 2021-04-24 (×4): 10 meq via INTRAVENOUS
  Filled 2021-04-24 (×2): qty 100

## 2021-04-24 MED ORDER — MAGNESIUM SULFATE 2 GM/50ML IV SOLN
2.0000 g | Freq: Once | INTRAVENOUS | Status: AC
  Administered 2021-04-24: 2 g via INTRAVENOUS
  Filled 2021-04-24: qty 50

## 2021-04-24 MED ORDER — POTASSIUM CHLORIDE CRYS ER 20 MEQ PO TBCR: 20.0000 meq | EXTENDED_RELEASE_TABLET | Freq: Once | ORAL | Status: DC

## 2021-04-24 NOTE — Progress Notes (Signed)
Bilateral lower extremity venous duplex completed. Refer to "CV Proc" under chart review to view preliminary results.  04/24/2021 2:53 PM Kelby Aline., MHA, RVT, RDCS, RDMS

## 2021-04-24 NOTE — Progress Notes (Signed)
[place] Progress Note Patient Name: Kimberly Irwin DOB: August 08, 1945 MRN: [id number]   Date of Service  04/24/2021  HPI/Events of Note  KCl low despite supplementation  eICU Interventions  Ordered IV KCl  Ordered IV mag as well     Intervention Category Minor Interventions: Electrolytes abnormality - evaluation and management  Tilden Dome 04/24/2021, 3:02 AM

## 2021-04-24 NOTE — Progress Notes (Signed)
NAME:  Kimberly Irwin, MRN:  [id number], DOB:  08-27-1945, LOS: 3 ADMISSION DATE:  04/21/2021, CONSULTATION DATE: 04/21/2021 REFERRING MD:  Dr. Kathrynn Humble, CHIEF COMPLAINT: Hypoxia and hypotension  History of Present Illness:  Kimberly Irwin is a 75 y.o. female with an extensive past medical history significant for but not limited to sarcoidosis (no active disease but remains on steroids due to SOB), chronic hypoxic respiratory failure requiring 8 L nasal cannula at baseline, emphysema, pulmonary hypertension, and HFpEF who presented to the emergency department via EMS today due to complaints of altered behavior and progressive weakness per family.  Daughter states patient had episode of full body shaking but did not lose consciousness or lose function of bowel or bladder.  Patient also had signs of hypoxia with cyanosis observed per family.  On ED arrival patient was seen hypotensive and hypoxic with SPO2 65 on home 8 L nasal cannula.  Patient was placed on BiPAP therapy with improvement in oxygenation.  However BiPAP removed when ABG revealed respiratory alkalosis with pH 7.64, CO2 21.9, bicarb 23.8.  Other pertinent lab work includes potassium 3.4, lactic acid 2.1, WBC 11.5.  Chest x-ray consistent with pulmonary edema.  Additionally while in the emergency department patient was seen with cardiac arrhythmia concerning for V. tach and patient was started on amiodarone drip with electrolyte replacement as patient presented with MOST form explicitly requesting no cardioversion.  Cardiology has been consulted and felt patient's arrhythmia was likely artifact from severe essential tremor and amiodarone drip was stopped.Marland Kitchen   PCCM consulted for further management and admission.  Pertinent  Medical History  Significant for but not limited to sarcoidosis (no active disease but remains on steroids due to SOB), chronic hypoxic respiratory failure requiring 8 L nasal cannula at baseline, emphysema, pulmonary  hypertension, and HFpEF  Significant Hospital Events:  9/19 admitted with acute on chronic respiratory failure likely due to volume overload, weaned to 10L [place], given IV lasix awaiting response 9/20 diuresed well weight down, no change in hypoxemia  Interim History / Subjective:  Still more hypoxemic than baseline, she feels breathing no different or worse than normal. Diuresing well. Cr stable. Weight down. Not making much progress.   Objective   Blood pressure 140/82, pulse 89, temperature 97.6 F (36.4 C), temperature source Oral, resp. rate (!) 22, height 5' 3.5" (1.613 m), weight 60.4 kg, SpO2 (!) 89 %.        Intake/Output Summary (Last 24 hours) at 04/24/2021 0815 Last data filed at 04/24/2021 0600 Gross per 24 hour  Intake 594.49 ml  Output 2360 ml  Net -1765.51 ml    Filed Weights   04/22/21 0500 04/23/21 0500 04/24/21 0400  Weight: 66.4 kg 63.5 kg 60.4 kg    Examination: General: Acute on chronic ill-appearing elderly female lying in bed all in no acute distress HEENT: [place]/AT, MM pink/moist, PERRL, significant essential tremor mainly seen in jaw extending to upper torso Neuro: Alert and oriented x3, essential tremor CV: s1s2 regular rate and rhythm, no murmur, rubs, or gallops,  PULM: Clear to auscultation bilaterally, diminished bases, no increased work of breathing, on [place] currently GI: soft, bowel sounds active in all 4 quadrants, non-tender, non-distended Extremities: warm/dry, no edema  Skin: no rashes or lesions  Resolved Hospital Problem list     Assessment & Plan:  Acute on chronic hypoxic respiratory failure -On 8 L nasal cannula at baseline Emphysema Sarcoidosis 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  Per daughter patient's baseline blood pressure ranges in upper 34K systolically. --MAP goal 60  Chronic heart failure with preserved EF Hypertension Concern for new arrhythmia including V. Tach --Patient was seen with change in rhythm in emergency department with concern for new arrhythmia however this appears to be artifact from significant essential tremor.  Cardiology stopped amiodarone drip.  Of note however patient was not want cardioversion if arrhythmia does develop. --Strict intake and output  --Daily weight to assess volume status --Lasix IV BID  Essential tremor: At baseline --Supportive care  Hypokalemia --improved after supplementation, replace as needed K > 4  Goals of care Per chart review and discussion with daughter patient has had extensive advanced care planning completed with established DNI/DNR and MOST form.  Palliative care has been consulted to help family further determine acute goals of care including but not limited to desire for aggressive measures including vasopressor support.  We will continue to coordinate with palliative care and family to help patient determine best plan of care.   Best Practice (right click and "Reselect all SmartList Selections" daily)   Diet/type: Regular consistency (see orders) DVT prophylaxis: LMWH GI prophylaxis: N/A Lines: N/A Foley:  N/A Code Status:  DNR Last date of multidisciplinary goals of care discussion: Discussed plan of care at bedside with daughter and patient day of admission 9/18   Labs   CBC: Recent Labs  Lab 04/21/21 1309 04/21/21 1317 04/21/21 1613 04/22/21 0014 04/22/21 0042 04/23/21 0355 04/24/21 0129  WBC 11.5*  --  9.0  --  6.9 8.2 7.6  NEUTROABS 9.2*  --   --   --   --   --   --   HGB 12.0   < > 11.4* 9.5* 10.9* 11.6* 12.0  HCT 36.5   < > 34.1* 28.0* 31.8* 34.8* 35.8*  MCV 90.8  --  90.5  --  89.1 90.4 89.3  PLT 210  --  183  --  177 197 211   < >  = values in this interval not displayed.     Basic Metabolic Panel: Recent Labs  Lab 04/22/21 0042 04/22/21 1635 04/23/21 0355 04/23/21 1609 04/24/21 0129  NA 134* 139 134* 137 138  K 3.4* 3.3* 4.4 3.9 3.6  CL 100 100 101 101 101  CO2 _0 GLUCOSE 89 120* 97 104* 95  BUN _1 CREATININE 1.39* 1.34* 1.21* 1.26* 1.32*  CALCIUM 8.5* 9.2 9.1 9.1 9.0  MG 2.1  --  1.9  --  1.8  PHOS 3.1  --   --   --   --     GFR: Estimated Creatinine Clearance: 30.2 mL/min (A) (by C-G formula based on SCr of 1.32 mg/dL (H)). Recent Labs  Lab 04/21/21 1309 04/21/21 1613 04/21/21 1700 04/21/21 2047 04/22/21 0042 04/23/21 0355 04/24/21 0129  PROCALCITON  --   --  <0.10  --   --   --   --   WBC 11.5* 9.0  --   --  6.9 8.2 7.6  LATICACIDVEN 2.1*  --   --  1.5  --   --   --      Liver Function Tests: Recent Labs  Lab 04/21/21 1309  AST 17  ALT 8  ALKPHOS 40  BILITOT 0.6  PROT 5.5*  ALBUMIN 2.6*    No results for input(s): LIPASE, AMYLASE in the last 168 hours. No results for input(s): AMMONIA in the last 168 hours.  ABG    Component Value Date/Time   PHART 7.441 04/22/2021 0014   PCO2ART 34.4 04/22/2021 0014   PO2ART 58 (L) 04/22/2021 0014   HCO3 23.5 04/22/2021 0014   TCO2 25 04/22/2021 0014   O2SAT 91.0 04/22/2021 0014      Coagulation Profile: No results for input(s): INR, PROTIME in the last 168 hours.  Cardiac Enzymes: No results for input(s): CKTOTAL, CKMB, CKMBINDEX, TROPONINI in the last 168 hours.  HbA1C: Hgb A1c MFr Bld  Date/Time Value Ref Range Status  09/14/2018 05:51 PM 5.4 4.6 - 6.5 % Final    Comment:    Glycemic Control Guidelines for People with Diabetes:Non Diabetic:  <6%Goal of Therapy: <7%Additional Action Suggested:  >8%   06/19/2017 11:35 AM 5.5 4.6 - 6.5 % Final    Comment:    Glycemic Control Guidelines for People with Diabetes:Non Diabetic:  <6%Goal of Therapy: <7%Additional Action Suggested:  >8%      CBG: Recent Labs  Lab 04/21/21 2003 04/22/21 1528  GLUCAP 137* 119*     Review of Systems:   Please see the history of present illness. All other systems reviewed and are negative    Past Medical History:  She,  has a past medical history of Bursitis of left hip (10/26/2012), Chronic low back pain, Chronic respiratory failure with hypoxia ([place]) (10/21/2016), Coronary artery calcification (07/03/2016), DDD (degenerative disc disease), Degenerative spondylolisthesis, Emphysema lung ([place]), GERD (gastroesophageal reflux disease), Hiatal hernia, Hyperlipidemia, Hypertension, Hypothyroidism (10/28/2015), Pain in joint, lower leg (10/26/2012), Pneumonia, Pulmonary hypertension ([place]), Sarcoidosis, and Tremors of nervous system.   Surgical History:   Past Surgical History:  Procedure Laterality Date   ANTERIOR LAT LUMBAR FUSION Left 08/29/2014   Procedure: EXTREME LEFT LATERAL INTERBODY FUSION LUMBAR TWO-THREE LATERAL PLATE;  Surgeon: Charlie Pitter, MD;  Location: [place] [facility];  Service: Neurosurgery;  Laterality: Left;   BACK SURGERY  01/15/09   L3-4 and L4-5 decompressive laminectomy with bilateral L3, L4, and L5 decompressive foraminotomies, more than it would be required for simple interbody fusion alone.   BACK SURGERY  01/15/09   L3-4 and L4-5 posterior lumbar interbody fusion utilizing tanget interbody allograft wedge, [place] interbody PEEk cage, and local autografting.   BACK SURGERY  01/15/09   L3, L4, and L5 posterolateral arthrodesis using segmental pedicle screw fixation and local autografting.   CARDIAC CATHETERIZATION     CARPAL TUNNEL RELEASE Bilateral    COLONOSCOPY     ESOPHAGOGASTRODUODENOSCOPY     EYE SURGERY     lens implant   JOINT REPLACEMENT  09/13/09   Right Hip, Dr. Alvan Dame   KYPHOPLASTY N/A 09/14/2014   Procedure: Lumbar Two Kyphoplasty/Vertebroplasty;  Surgeon: Charlie Pitter, MD;  Location: [place] [facility];  Service: Neurosurgery;  Laterality: N/A;  Lumbar Two  Kyphoplasty/Vertebroplasty   RIGHT HEART CATH N/A 11/19/2017   Procedure: RIGHT HEART CATH;  Surgeon: Larey Dresser, MD;  Location: [place] [facility];  Service: Cardiovascular;  Laterality: N/A;   RIGHT HEART CATH N/A 05/27/2019   Procedure: RIGHT HEART CATH;  Surgeon: Larey Dresser, MD;  Location: [place] [facility];  Service: Cardiovascular;  Laterality: N/A;   RIGHT HEART CATH N/A 01/07/2021   Procedure: RIGHT HEART CATH;  Surgeon: Larey Dresser, MD;  Location: [place] [facility];  Service: Cardiovascular;  Laterality: N/A;   THYROIDECTOMY     TOTAL HIP ARTHROPLASTY Left 06/28/2013   Procedure: LEFT TOTAL  HIP ARTHROPLASTY ANTERIOR APPROACH;  Surgeon: Mauri Pole, MD;  Location: [facility];  Service: Orthopedics;  Laterality: Left;     Social History:   reports that she quit smoking about 26 years ago. Her smoking use included cigarettes. She has a 16.00 pack-year smoking history. She has never used smokeless tobacco. She reports that she does not drink alcohol and does not use drugs.   Family History:  Her family history includes Asthma in her maternal grandmother; Cancer (age of onset: 70) in her mother; Heart attack in her mother; Heart disease in her mother; Hyperlipidemia in her mother; Hypertension in her mother.   Allergies Allergies  Allergen Reactions   Lipitor [Atorvastatin] Swelling   Penicillins Swelling    Has patient had a PCN reaction causing immediate rash, facial/tongue/throat swelling, SOB or lightheadedness with hypotension: Yes Has patient had a PCN reaction causing severe rash involving mucus membranes or skin necrosis: No Has patient had a PCN reaction that required hospitalization: No Has patient had a PCN reaction occurring within the last 10 years: No If all of the above answers are "NO", then may proceed with Cephalosporin use.    Metronidazole Swelling   Pregabalin Other (See Comments)    REACTION: Somnolence and dizziness   Simvastatin Other (See  Comments)    Leg pain     Home Medications  Prior to Admission medications   Medication Sig Start Date End Date Taking? Authorizing Provider  acetaminophen (TYLENOL) 500 MG tablet Take 1,000 mg by mouth every 6 (six) hours as needed for moderate pain.    [provider]  albuterol (PROVENTIL) (2.5 MG/3ML) 0.083% nebulizer solution Take 2.5 mg by nebulization every 6 (six) hours as needed for wheezing or shortness of breath.    [provider]  aspirin EC 81 MG tablet Take 81 mg by mouth at bedtime.     [provider]  atenolol (TENORMIN) 25 MG tablet TAKE 1 TABLET (25 MG TOTAL) BY MOUTH AT BEDTIME. TAKE THE 50 MG ATENOLOL IN AM AND THE 25 MG IN PM 08/15/20   Mosie Lukes, MD  atenolol (TENORMIN) 50 MG tablet TAKE 1 TABLET BY MOUTH EVERY DAY 02/01/21   Mosie Lukes, MD  BESIVANCE 0.6 % SUSP Place 1 drop into the left eye See admin instructions. 1 drop into left eye QID for 2 days after eye injection 12/18/14   [provider]  calcium carbonate (OSCAL) 1500 (600 Ca) MG TABS tablet Take 1,500 mg by mouth daily with breakfast.     [provider]  Cholecalciferol (VITAMIN D) 2000 UNITS tablet Take 2,000 Units by mouth every evening.     [provider]  CINNAMON PO Take 2,000 mg by mouth daily.    [provider]  citalopram (CELEXA) 10 MG tablet Take 1 tablet (10 mg total) by mouth daily. 09/14/20   Saguier, Percell Miller, PA-C  Coenzyme Q10 200 MG capsule Take 200 mg by mouth at bedtime.     [provider]  Fexofenadine HCl (ALLEGRA ALLERGY PO) Take 1 tablet by mouth daily.    [provider]  Fluticasone-Umeclidin-Vilant (TRELEGY ELLIPTA) 100-62.5-25 MCG/INH AEPB Inhale 1 Inhaler into the lungs daily. 02/27/21   Jamaul Heist, Bonna Gains, MD  furosemide (LASIX) 40 MG tablet Take 1 tablet (  40 mg total) by mouth daily. 03/21/21   Linley Moskal, Bonna Gains, MD  gabapentin (NEURONTIN) 300 MG capsule Take 1 capsule (300 mg total) by  mouth at bedtime. 09/14/20   Saguier, Percell Miller, PA-C  hydrochlorothiazide (MICROZIDE) 12.5 MG capsule TAKE 1 CAPSULE BY MOUTH EVERY DAY 02/01/21   Larey Dresser, MD  ipratropium-albuterol (DUONEB) 0.5-2.5 (3) MG/3ML SOLN Take 3 mLs by nebulization every 6 (six) hours. 03/06/21   Cassey Hurrell, Bonna Gains, MD  ketoconazole (NIZORAL) 2 % shampoo Apply 1 application topically daily as needed for irritation (scalp).    [provider]  levocetirizine (XYZAL) 5 MG tablet TAKE 1 TABLET BY MOUTH EVERY DAY IN THE EVENING 12/06/20   Saguier, Percell Miller, PA-C  levothyroxine (SYNTHROID) 112 MCG tablet TAKE 1 TABLET BY MOUTH EVERY DAY BEFORE BREAKFAST 04/15/21   Mosie Lukes, MD  Omega-3 Fatty Acids (FISH OIL) 1200 MG CAPS Take 1,200 mg by mouth daily.    [provider]  omeprazole (PRILOSEC) 40 MG capsule TAKE 1 CAPSULE BY MOUTH EVERY DAY 04/15/21   Mosie Lukes, MD  potassium chloride (KLOR-CON) 10 MEQ tablet Take 2 tablets (20 mEq total) by mouth daily. 02/27/21   [place], Maricela Bo, FNP  rosuvastatin (CRESTOR) 20 MG tablet TAKE 1 TABLET BY MOUTH EVERY DAY Patient taking differently: Take 20 mg by mouth daily. 02/01/21   Mosie Lukes, MD  Selexipag 1600 MCG TABS Take 1 tablet (1,600 mcg total) by mouth 2 (two) times daily. 01/14/21   Damita Lack, MD  Spacer/Aero-Holding Chambers (AEROCHAMBER PLUS WITH MASK) inhaler Use as instructed 01/28/18   Debbe Odea, MD  tadalafil, PAH, (ADCIRCA) 20 MG tablet TAKE 2 TABLETS BY MOUTH EVERY DAY 10/16/20   Larey Dresser, MD  traMADol (ULTRAM) 50 MG tablet Take 1 tablet (50 mg total) by mouth every 6 (six) hours as needed for moderate pain or severe pain. 01/14/21 01/14/22  Amin, Jeanella Flattery, MD  vitamin B-12 (CYANOCOBALAMIN) 1000 MCG tablet Take 1,000 mcg by mouth daily.    [provider]     Critical care time:    Performed by: Lanier Clam  Total critical care time: 31 minutes  Critical care time was exclusive of separately  billable procedures and treating other patients.  Critical care was necessary to treat or prevent imminent or life-threatening deterioration.  Critical care was time spent personally by me on the following activities: development of treatment plan with patient and/or surrogate as well as nursing, discussions with consultants, evaluation of patient's response to treatment, examination of patient, obtaining history from patient or surrogate, ordering and performing treatments and interventions, ordering and review of laboratory studies, ordering and review of radiographic studies, pulse oximetry and re-evaluation of patient's condition.  Lanier Clam, MD  [facility] Personal contact information can be found on Amion  04/24/2021, 8:15 AM

## 2021-04-25 DIAGNOSIS — J9620 Acute and chronic respiratory failure, unspecified whether with hypoxia or hypercapnia: Secondary | ICD-10-CM | POA: Diagnosis not present

## 2021-04-25 DIAGNOSIS — R0902 Hypoxemia: Secondary | ICD-10-CM | POA: Diagnosis not present

## 2021-04-25 DIAGNOSIS — Z515 Encounter for palliative care: Secondary | ICD-10-CM | POA: Diagnosis not present

## 2021-04-25 DIAGNOSIS — I959 Hypotension, unspecified: Secondary | ICD-10-CM | POA: Diagnosis not present

## 2021-04-25 DIAGNOSIS — I270 Primary pulmonary hypertension: Secondary | ICD-10-CM | POA: Diagnosis not present

## 2021-04-25 LAB — MAGNESIUM: Magnesium: 2.2 mg/dL (ref 1.7–2.4)

## 2021-04-25 LAB — BASIC METABOLIC PANEL
Anion gap: 9 (ref 5–15)
BUN: 9 mg/dL (ref 8–23)
CO2: 30 mmol/L (ref 22–32)
Calcium: 9 mg/dL (ref 8.9–10.3)
Chloride: 98 mmol/L (ref 98–111)
Creatinine, Ser: 1.33 mg/dL — ABNORMAL HIGH (ref 0.44–1.00)
GFR, Estimated: 41 mL/min — ABNORMAL LOW (ref >60)
Glucose, Bld: 92 mg/dL (ref 70–99)
Potassium: 3.8 mmol/L (ref 3.5–5.1)
Sodium: 137 mmol/L (ref 135–145)

## 2021-04-25 MED ORDER — POTASSIUM CHLORIDE CRYS ER 20 MEQ PO TBCR
40.0000 meq | EXTENDED_RELEASE_TABLET | Freq: Once | ORAL | Status: AC
  Administered 2021-04-25: 20 meq via ORAL
  Filled 2021-04-25: qty 2

## 2021-04-25 MED ORDER — FUROSEMIDE 10 MG/ML IJ SOLN
40.0000 mg | Freq: Four times a day (QID) | INTRAMUSCULAR | Status: AC
  Administered 2021-04-25 (×2): 40 mg via INTRAVENOUS
  Filled 2021-04-25 (×2): qty 4

## 2021-04-25 MED ORDER — POTASSIUM CHLORIDE 20 MEQ PO PACK
20.0000 meq | PACK | Freq: Once | ORAL | Status: AC
  Administered 2021-04-25: 20 meq via ORAL
  Filled 2021-04-25: qty 1

## 2021-04-25 NOTE — Progress Notes (Signed)
Daily Progress Note   Patient Name: Kimberly Irwin       Date: 04/25/2021 DOB: Jan 29, 1946  Age: 75 y.o. MRN#: [id number] Attending Physician: Lanier Clam, MD Primary Care Physician: Mosie Lukes, MD Admit Date: 04/21/2021  Reason for Consultation/Follow-up: Establishing goals of care  Subjective: Discussed case with Dr. Silas Flood. Patient continues to diurese but not making much progress.    12:30 - I had a lengthy discussion with patient at bedside. Confirmed with her that she wants to go home and does not want to return to the hospital. I told her very frankly that she is not medically stable for discharge despite current medical interventions, and that this will likely not change. I made a strong recommendation for hospice care at home, which seems to be consistent with patient's stated wishes of remaining at home and not returning to the hospital. Patient does acknowledge that she feels her "time is limited".   Provided education and counseling at length on the philosophy and benefits of hospice care. Discussed that it offers a holistic approach to care in the setting of end-stage illness/disease, and is about supporting the patient while allowing the natural course to occur.  Patient is tearful and withdrawn during our conversation. She states she will consider but is not ready to commit at this time. However, she is willing to continue our discussion later this afternoon.  17:45 - I returned to room. Patient's sister-in-law Silva Bandy is at bedside. With patient's permission, I told Silva Bandy about our conversation earlier today and recommendation for home hospice. Silva Bandy agrees that hospice would be appropriate. Patient eventually shares that the reason she is resistant to hospice, is  because her husband died under hospice. He was under hospice care at home for only a few days before he decided to go to [facility] where he died quickly. Patient feels that his death was hastened. Silva Bandy states that her brother (patient's husband) had very advanced cancer and was having a lot of pain. I provided education on the use of opioids at EOL, emphasizing that the intent is to use the least amount necessary to relieve pain and suffering. The goal is never to hasten the dying process.    I updated Dr. Silas Flood and bedside RN on my discussions with patient.   Length of Stay: 4  Physical Exam Vitals reviewed.  Constitutional:      General: She is not in acute distress.    Appearance: She is ill-appearing.  Pulmonary:     Effort: Pulmonary effort is normal.  Neurological:     Mental Status: She is alert and oriented to person, place, and time.     Motor: Weakness present.  Psychiatric:        Mood and Affect: Affect is tearful.        Behavior: Behavior is withdrawn.            Vital Signs: BP 118/81   Pulse 87   Temp 98.6 F (37 C) (Oral)   Resp (!) 21   Ht 5' 3.5" (1.613 m)   Wt 63.5 kg   SpO2 90%   BMI 24.41 kg/m  SpO2: SpO2: 90 % O2 Device: O2 Device: High Flow Nasal Cannula O2 Flow Rate: O2 Flow Rate (L/min): 10 L/min  Intake/output summary:  Intake/Output Summary (Last 24 hours) at 04/25/2021 2106 Last data filed at 04/25/2021 1700 Gross per 24 hour  Intake 360 ml  Output 700 ml  Net -340 ml   LBM: Last BM Date: 04/24/21 Baseline Weight: Weight: 65.4 kg Most recent weight: Weight: 63.5 kg       Palliative Assessment/Data PPS 20-30%      Palliative Care Assessment & Plan   HPI/Patient Profile: 75 y.o. female  with past medical history of sarcoidosis (no active disease but remains on steroids due to shortness of breath), chronic hypoxic respiratory failure requiring 8L nasal cannula at baseline, emphysema, pulmonary hypertension, and HFpEF. She  presented to the emergency department from home on 04/21/21 with altered mental status and progressive weakness per family. Daughter called EMS reporting that patient's pulse oximetry was unreadable. On arrival to the ED patient was hypotensive and hypoxic with spo2 65% on home 8L. Placed in BiPAP with improvement in oxygenation. However BiPAP was removed when ABG revealed respiratory alkalosis. Chest x-ray consistent with pulmonary edema. Started on vasopressor therapy. Patient also found to have a cardiac arrhythmia concerning for v-tach, however later determined to be cardiac artifact secondary to essential tremor. Admitted to PCCM.    Assessment: - acute on chronic hypoxic respiratory failure - emphysema - sarcoidosis on chronic steroids - pulmonary hypertension - hypotension - chronic heat failure with preserved EF - essential tremor - hypokalemia  Recommendations/Plan: DNR/DNI Patient wants to go home but is not medically stable for discharge Strong recommendation made for home with hospice - patient is receptive to our discussion but hesitant because she feels her husband's death was hastened under hospice care PMT will follow up tomorrow   Goals of Care and Additional Recommendations: Limitations on Scope of Treatment: Avoid Hospitalization and No Artificial Feeding   Per MOST form completed on 04/22/21:  Cardiopulmonary Resuscitation: Do Not Attempt Resuscitation (DNR/No CPR)  Medical Interventions: Limited Additional Interventions: Use medical treatment, IV fluids and cardiac monitoring as indicated, DO NOT USE intubation or mechanical ventilation. May consider use of less invasive airway support such as BiPAP or CPAP. Also provide comfort measures. Transfer to the hospital if indicated. Avoid intensive care.   Antibiotics: Antibiotics if indicated  IV Fluids: IV fluids for a defined trial period    Feeding Tube: No feeding tube       Prognosis:  < 6 months  Discharge  Planning: To Be Determined  Thank you for allowing the Palliative Medicine Team to assist in the care of this patient.  Total Time 67 minutes Prolonged Time Billed  yes       Greater than 50%  of this time was spent counseling and coordinating care related to the above assessment and plan.  Lavena Bullion, NP  Please contact Palliative Medicine Team phone at [phone] for questions and concerns.

## 2021-04-25 NOTE — Evaluation (Signed)
Occupational Therapy Evaluation Patient Details Name: Kimberly Irwin MRN: [id number] DOB: 1945/09/08 Today's Date: 04/25/2021   History of Present Illness 75 y.o. female with an extensive past medical history significant for but not limited to sarcoidosis (no active disease but remains on steroids due to SOB), chronic hypoxic respiratory failure requiring 8 L nasal cannula at baseline, emphysema, pulmonary hypertension, and HFpEF who presented due to complaints of altered behavior and progressive weakness per family.   Clinical Impression   Patient admitted for the diagnosis above.  PTA she lives with her daughter, who works outside the home.  When her daughter leaves, her cousin stays with her.  The patient's cousin assists with shower transfers, Min Guard for mobility at [facility] level, and needs assist with lower body dressing, meals, meds, and home management.  Deficits impacting independence are listed below.  Currently she is needing up to +2 for basic transfers, and near dependent care for lower body ADL.        Recommendations for follow up therapy are one component of a multi-disciplinary discharge planning process, led by the attending physician.  Recommendations may be updated based on patient status, additional functional criteria and insurance authorization.   Follow Up Recommendations  SNF    Equipment Recommendations  None recommended by OT    Recommendations for Other Services       Precautions / Restrictions Precautions Precautions: Fall Precaution Comments: Watch O2 Restrictions Weight Bearing Restrictions: No Other Position/Activity Restrictions: Tremors      Mobility Bed Mobility Overal bed mobility: Needs Assistance Bed Mobility: Supine to Sit;Sidelying to Sit   Sidelying to sit: Min assist Supine to sit: Min assist     General bed mobility comments: scoot hip to EOB closer to Mod A Patient Response: Flat affect  Transfers Overall transfer level: Needs  assistance Equipment used: 2 person hand held assist Transfers: Sit to/from [employer] Transfers Sit to Stand: Max assist;+2 physical assistance Stand pivot transfers: Max assist;+2 physical assistance       General transfer comment: unable to bring her hips forward to stand    Balance Overall balance assessment: Needs assistance Sitting-balance support: Bilateral upper extremity supported;Feet supported Sitting balance-Leahy Scale: Fair     Standing balance support: Bilateral upper extremity supported Standing balance-Leahy Scale: Zero Standing balance comment: unable to stand effectively                           ADL either performed or assessed with clinical judgement   ADL Overall ADL's : Needs assistance/impaired     Grooming: Wash/dry hands;Wash/dry face;Set up;Bed level   Upper Body Bathing: Moderate assistance;Bed level   Lower Body Bathing: Bed level;Maximal assistance   Upper Body Dressing : Total assistance;Bed level;Moderate assistance   Lower Body Dressing: Total assistance;Bed level   Toilet Transfer: +2 for physical assistance;Maximal assistance;Stand-pivot;BSC   Toileting- Clothing Manipulation and Hygiene: Sit to/from stand;Total assistance       Functional mobility during ADLs: Maximal assistance;+2 for physical assistance       Vision Patient Visual Report: No change from baseline       Perception     Praxis      Pertinent Vitals/Pain Pain Assessment: Faces Faces Pain Scale: No hurt Pain Intervention(s): Monitored during session     Hand Dominance Right   Extremity/Trunk Assessment Upper Extremity Assessment Upper Extremity Assessment: Generalized weakness   Lower Extremity Assessment Lower Extremity Assessment: Generalized weakness   Cervical /  Trunk Assessment Cervical / Trunk Assessment: Kyphotic   Communication Communication Communication: No difficulties   Cognition Arousal/Alertness:  Awake/alert Behavior During Therapy: WFL for tasks assessed/performed Overall Cognitive Status: No family/caregiver present to determine baseline cognitive functioning                                 General Comments: following commands and answering questions appropriately.   General Comments   Desaturated to 73% after transfer, with longer recovery time.      Exercises     Shoulder Instructions      Home Living Family/patient expects to be discharged to:: Private residence Living Arrangements: Children Available Help at Discharge: Family;Available 24 hours/day Type of Home: House Home Access: Ramped entrance     Home Layout: One level     Bathroom Shower/Tub: Teacher, early years/pre: Handicapped height     Home Equipment: [place] - single point;Walker - 2 wheels;Bedside commode;Shower seat;Grab bars - tub/shower;Walker - 4 wheels;Wheelchair - Scientist, physiological: Reacher;Long-handled sponge        Prior Functioning/Environment Level of Independence: Needs assistance  Gait / Transfers Assistance Needed: uses a Insurance risk surveyor for in home mobility. ADL's / Homemaking Assistance Needed: Cousin assist with shower trnafers and lower boday dressing            OT Problem List: Decreased strength;Decreased range of motion;Decreased activity tolerance;Impaired balance (sitting and/or standing)      OT Treatment/Interventions: Self-care/ADL training;Therapeutic exercise;DME and/or AE instruction;Balance training;Therapeutic activities    OT Goals(Current goals can be found in the care plan section) Acute Rehab OT Goals Patient Stated Goal: Would like to go home OT Goal Formulation: With patient Time For Goal Achievement: 05/09/21 Potential to Achieve Goals: Fair ADL Goals Pt Will Perform Grooming: with supervision;sitting Pt Will Perform Upper Body Bathing: with supervision;sitting Pt Will Perform Upper Body Dressing:  with supervision;sitting Pt Will Transfer to Toilet: with mod assist;stand pivot transfer;bedside commode Pt Will Perform Toileting - Clothing Manipulation and hygiene: with min assist;sit to/from stand;sitting/lateral leans Pt/caregiver will Perform Home Exercise Program: Increased strength;Both right and left upper extremity;With theraband;With Supervision;With written HEP provided  OT Frequency: Min 2X/week   Barriers to D/C: Other (comment)  needs to be able to mobilize at home with Min Guard       Co-evaluation              AM-PAC OT "6 Clicks" Daily Activity     Outcome Measure Help from another person eating meals?: None Help from another person taking care of personal grooming?: A Little Help from another person toileting, which includes using toliet, bedpan, or urinal?: Total Help from another person bathing (including washing, rinsing, drying)?: A Lot Help from another person to put on and taking off regular upper body clothing?: A Lot Help from another person to put on and taking off regular lower body clothing?: Total 6 Click Score: 13   End of Session Equipment Utilized During Treatment: Oxygen Nurse Communication: Mobility status  Activity Tolerance: Patient limited by fatigue Patient left: in chair;with call bell/phone within reach;with chair alarm set  OT Visit Diagnosis: Unsteadiness on feet (R26.81);Muscle weakness (generalized) (M62.81)                Time: 1000-1028 OT Time Calculation (min): 28 min Charges:  OT General Charges $OT Visit: 1 Visit OT Evaluation $OT Eval Moderate Complexity: 1 Mod  04/25/2021  RP, OTR/L  Acute Rehabilitation Services  Office:  [phone]   Metta Clines 04/25/2021, 11:02 AM

## 2021-04-25 NOTE — Evaluation (Signed)
Physical Therapy Evaluation Patient Details Name: Kimberly Irwin MRN: [id number] DOB: 02-Jul-1946 Today's Date: 04/25/2021  History of Present Illness  75 y.o. female who presented 9/18 with altered behavior and progressive weakness per family.  PMHx: sarcoidosis (no active disease but remains on steroids due to SOB), chronic hypoxic respiratory failure requiring 8 L nasal cannula at baseline, emphysema, pulmonary hypertension, and HFpEF.  Clinical Impression  Pt admitted with/for progressive weakness and Altered behaviors.  Pt is presently mobilizing at a min to max assist level for basic mobility.  Pt currently limited functionally due to the problems listed. ( See problems list.)   Pt will benefit from PT to maximize function and safety in order to get ready for next venue listed below.        Recommendations for follow up therapy are one component of a multi-disciplinary discharge planning process, led by the attending physician.  Recommendations may be updated based on patient status, additional functional criteria and insurance authorization.  Follow Up Recommendations SNF;Supervision/Assistance - 24 hour    Equipment Recommendations  None recommended by PT;Other (comment) (TBA)    Recommendations for Other Services       Precautions / Restrictions Precautions Precautions: Fall Precaution Comments: Watch O2 Restrictions Weight Bearing Restrictions: No Other Position/Activity Restrictions: Tremors      Mobility  Bed Mobility Overal bed mobility: Needs Assistance Bed Mobility: Supine to Sit;Sidelying to Sit   Sidelying to sit: Min assist Supine to sit: Min assist     General bed mobility comments: scoot hip to EOB closer to Mod A    Transfers Overall transfer level: Needs assistance Equipment used: 2 person hand held assist Transfers: Sit to/from [employer] Sit to Stand: Max assist;+2 physical assistance Stand pivot transfers: Max assist;+2 physical  assistance       General transfer comment: cues for direction, face to face assist to come forward and boost.  Pt still having problems "tucking" her pelvis in and standing erect with feet tending to slide forward.  stand pivot with significant stability assist.  Ambulation/Gait                Stairs            Wheelchair Mobility    Modified Rankin (Stroke Patients Only)       Balance Overall balance assessment: Needs assistance Sitting-balance support: Bilateral upper extremity supported;Feet supported Sitting balance-Leahy Scale: Fair     Standing balance support: Bilateral upper extremity supported Standing balance-Leahy Scale: Zero Standing balance comment: unable to stand fully upright and tuck her hips under her.                             Pertinent Vitals/Pain Pain Assessment: Faces Faces Pain Scale: No hurt Pain Intervention(s): Monitored during session    Home Living Family/patient expects to be discharged to:: Private residence Living Arrangements: Children Available Help at Discharge: Family;Available 24 hours/day Type of Home: House Home Access: Ramped entrance     Home Layout: One level Home Equipment: Cane - single point;Walker - 2 wheels;Bedside commode;Shower seat;Grab bars - tub/shower;Walker - 4 wheels;Wheelchair - Psychologist, educational      Prior Function Level of Independence: Needs assistance   Gait / Transfers Assistance Needed: uses a RW and [employer] for in home mobility.  ADL's / Homemaking Assistance Needed: Cousin assist with shower trnafers and lower boday dressing        Hand Dominance  Dominant Hand: Right    Extremity/Trunk Assessment   Upper Extremity Assessment Upper Extremity Assessment: Generalized weakness    Lower Extremity Assessment Lower Extremity Assessment: Generalized weakness    Cervical / Trunk Assessment Cervical / Trunk Assessment: Kyphotic  Communication    Communication: No difficulties  Cognition Arousal/Alertness: Awake/alert Behavior During Therapy: WFL for tasks assessed/performed Overall Cognitive Status: No family/caregiver present to determine baseline cognitive functioning                                 General Comments: following commands and answering questions appropriately.      General Comments General comments (skin integrity, edema, etc.): vss    Exercises     Assessment/Plan    PT Assessment Patient needs continued PT services  PT Problem List Decreased strength;Decreased activity tolerance;Decreased balance;Decreased mobility;Decreased knowledge of use of DME;Cardiopulmonary status limiting activity       PT Treatment Interventions Gait training;Functional mobility training;Therapeutic activities;DME instruction;Balance training;Patient/family education    PT Goals (Current goals can be found in the Care Plan section)  Acute Rehab PT Goals Patient Stated Goal: Would like to go home Time For Goal Achievement: 05/09/21 Potential to Achieve Goals: Good    Frequency Min 2X/week   Barriers to discharge        Co-evaluation PT/OT/SLP Co-Evaluation/Treatment: Yes Reason for Co-Treatment: For patient/therapist safety PT goals addressed during session: Mobility/safety with mobility         AM-PAC PT "6 Clicks" Mobility  Outcome Measure Help needed turning from your back to your side while in a flat bed without using bedrails?: A Little Help needed moving from lying on your back to sitting on the side of a flat bed without using bedrails?: A Little Help needed moving to and from a bed to a chair (including a wheelchair)?: A Lot Help needed standing up from a chair using your arms (e.g., wheelchair or bedside chair)?: A Lot Help needed to walk in hospital room?: A Lot Help needed climbing 3-5 steps with a railing? : A Lot 6 Click Score: 14    End of Session Equipment Utilized During Treatment:  Oxygen Activity Tolerance: Patient limited by fatigue;Patient tolerated treatment well Patient left: in chair;with call bell/phone within reach Nurse Communication: Mobility status PT Visit Diagnosis: Other abnormalities of gait and mobility (R26.89);Muscle weakness (generalized) (M62.81)    Time: 5993-5701 PT Time Calculation (min) (ACUTE ONLY): 23 min   Charges:   PT Evaluation $PT Eval Moderate Complexity: 1 Mod          04/25/2021  Ginger Carne., PT Acute Rehabilitation Services [phone]  (pager) [phone]  (office)  Tessie Fass Kaeden Depaz 04/25/2021, 2:05 PM

## 2021-04-25 NOTE — Progress Notes (Signed)
NAME:  Kimberly Irwin, MRN:  [id number], DOB:  [birth year], LOS: 4 ADMISSION DATE:  04/21/2021, CONSULTATION DATE: 04/21/2021 REFERRING MD:  Dr. Kathrynn Humble, CHIEF COMPLAINT: Hypoxia and hypotension  History of Present Illness:  Kimberly Irwin is a 75 y.o. female with an extensive past medical history significant for but not limited to sarcoidosis (no active disease but remains on steroids due to SOB), chronic hypoxic respiratory failure requiring 8 L nasal cannula at baseline, emphysema, pulmonary hypertension, and HFpEF who presented to the emergency department via EMS today due to complaints of altered behavior and progressive weakness per family.  Daughter states patient had episode of full body shaking but did not lose consciousness or lose function of bowel or bladder.  Patient also had signs of hypoxia with cyanosis observed per family.  On ED arrival patient was seen hypotensive and hypoxic with SPO2 65 on home 8 L nasal cannula.  Patient was placed on BiPAP therapy with improvement in oxygenation.  However BiPAP removed when ABG revealed respiratory alkalosis with pH 7.64, CO2 21.9, bicarb 23.8.  Other pertinent lab work includes potassium 3.4, lactic acid 2.1, WBC 11.5.  Chest x-ray consistent with pulmonary edema.  Additionally while in the emergency department patient was seen with cardiac arrhythmia concerning for V. tach and patient was started on amiodarone drip with electrolyte replacement as patient presented with MOST form explicitly requesting no cardioversion.  Cardiology has been consulted and felt patient's arrhythmia was likely artifact from severe essential tremor and amiodarone drip was stopped.Marland Kitchen   PCCM consulted for further management and admission.  Pertinent  Medical History  Significant for but not limited to sarcoidosis (no active disease but remains on steroids due to SOB), chronic hypoxic respiratory failure requiring 8 L nasal cannula at baseline, emphysema, pulmonary  hypertension, and HFpEF  Significant Hospital Events:  9/19 admitted with acute on chronic respiratory failure likely due to volume overload, weaned to 10L [place], given IV lasix awaiting response 9/20 diuresed well weight down, no change in hypoxemia 9/21 diuresing weight down  Interim History / Subjective:  Still more hypoxemic than baseline, she feels breathing no different or worse than normal. Diuresing well. Cr stable. Weight bumped back up. I/O show negative now so maybe just turning the corner.   Objective   Blood pressure 120/86, pulse 89, temperature 98.6 F (37 C), temperature source Oral, resp. rate 16, height 5' 3.5" (1.613 m), weight 63.5 kg, SpO2 (!) 88 %.        Intake/Output Summary (Last 24 hours) at 04/25/2021 0915 Last data filed at 04/25/2021 0600 Gross per 24 hour  Intake 460 ml  Output 1500 ml  Net -1040 ml    Filed Weights   04/23/21 0500 04/24/21 0400 04/25/21 0500  Weight: 63.5 kg 60.4 kg 63.5 kg    Examination: General: Acute on chronic ill-appearing elderly female lying in bed all in no acute distress HEENT: [place]/AT, MM pink/moist, PERRL, significant essential tremor mainly seen in jaw extending to upper torso Neuro: Alert and oriented x3, essential tremor CV: s1s2 regular rate and rhythm, no murmur, rubs, or gallops,  PULM: Clear to auscultation bilaterally, diminished bases, no increased work of breathing, on [place] currently GI: soft, bowel sounds active in all 4 quadrants, non-tender, non-distended Extremities: warm/dry, no edema  Skin: no rashes or lesions  Resolved Hospital Problem list     Assessment & Plan:  Acute on chronic hypoxic respiratory failure -On 8 L nasal cannula at baseline Emphysema Sarcoidosis 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  Per daughter patient's baseline blood pressure ranges in upper 69F systolically. --MAP goal 60  Chronic heart failure with preserved EF Hypertension Concern for new arrhythmia including V. Tach --Patient was seen with change in rhythm in emergency department with concern for new arrhythmia however this appears to be artifact from significant essential tremor.  Cardiology stopped amiodarone drip.  Of note however patient was not want cardioversion if arrhythmia does develop. --Strict intake and output  --Daily weight to assess volume status --Lasix IV BID  Essential tremor: At baseline --Supportive care  Hypokalemia --exacerbated by lasix, replace as needed K > 4  Goals of care Per chart review and discussion with daughter patient has had extensive advanced care planning completed with established DNI/DNR and MOST form.  Palliative care has been consulted to help family further determine acute goals of care including but not limited to desire for aggressive measures including vasopressor support.  We will continue to coordinate with palliative care and family to help patient determine best plan of care.   Best Practice (right click and "Reselect all SmartList Selections" daily)   Diet/type: Regular consistency (see orders) DVT prophylaxis: LMWH GI prophylaxis: N/A Lines: N/A Foley:  N/A Code Status:  DNR Last date of multidisciplinary goals of care discussion: Discussed plan of care at bedside with patient day of admission 9/22   Labs   CBC: Recent Labs  Lab 04/21/21 1309 04/21/21 1317 04/21/21 1613 04/22/21 0014 04/22/21 0042 04/23/21 0355 04/24/21 0129  WBC 11.5*  --  9.0  --  6.9 8.2 7.6  NEUTROABS 9.2*  --   --   --   --   --   --   HGB 12.0   < > 11.4* 9.5* 10.9* 11.6* 12.0  HCT 36.5   < > 34.1* 28.0* 31.8* 34.8* 35.8*  MCV 90.8   --  90.5  --  89.1 90.4 89.3  PLT 210  --  183  --  177 197 211   < > = values in this interval not displayed.     Basic Metabolic Panel: Recent Labs  Lab 04/22/21 0042 04/22/21 1635 04/23/21 0355 04/23/21 1609 04/24/21 0129 04/24/21 1515 04/25/21 0804  NA 134* 139 134* 137 138  --  137  K 3.4* 3.3* 4.4 3.9 3.6 4.1 3.8  CL 100 100 101 101 101  --  98  CO2 _0 --  30  GLUCOSE 89 120* 97 104* 95  --  92  BUN _1 --  9  CREATININE 1.39* 1.34* 1.21* 1.26* 1.32*  --  1.33*  CALCIUM 8.5* 9.2 9.1 9.1 9.0  --  9.0  MG 2.1  --  1.9  --  1.8  --  2.2  PHOS 3.1  --   --   --   --   --   --     GFR: Estimated Creatinine Clearance: 30 mL/min (A) (by C-G formula based on SCr of 1.33 mg/dL (H)). Recent Labs  Lab 04/21/21 1309 04/21/21 1613 04/21/21 1700 04/21/21 2047 04/22/21 0042 04/23/21 0355 04/24/21 0129  PROCALCITON  --   --  <  0.10  --   --   --   --   WBC 11.5* 9.0  --   --  6.9 8.2 7.6  LATICACIDVEN 2.1*  --   --  1.5  --   --   --      Liver Function Tests: Recent Labs  Lab 04/21/21 1309  AST 17  ALT 8  ALKPHOS 40  BILITOT 0.6  PROT 5.5*  ALBUMIN 2.6*    No results for input(s): LIPASE, AMYLASE in the last 168 hours. No results for input(s): AMMONIA in the last 168 hours.  ABG    Component Value Date/Time   PHART 7.441 04/22/2021 0014   PCO2ART 34.4 04/22/2021 0014   PO2ART 58 (L) 04/22/2021 0014   HCO3 23.5 04/22/2021 0014   TCO2 25 04/22/2021 0014   O2SAT 91.0 04/22/2021 0014      Coagulation Profile: No results for input(s): INR, PROTIME in the last 168 hours.  Cardiac Enzymes: No results for input(s): CKTOTAL, CKMB, CKMBINDEX, TROPONINI in the last 168 hours.  HbA1C: Hgb A1c MFr Bld  Date/Time Value Ref Range Status  09/14/2018 05:51 PM 5.4 4.6 - 6.5 % Final    Comment:    Glycemic Control Guidelines for People with Diabetes:Non Diabetic:  <6%Goal of Therapy: <7%Additional Action Suggested:  >8%   06/19/2017  11:35 AM 5.5 4.6 - 6.5 % Final    Comment:    Glycemic Control Guidelines for People with Diabetes:Non Diabetic:  <6%Goal of Therapy: <7%Additional Action Suggested:  >8%     CBG: Recent Labs  Lab 04/21/21 2003 04/22/21 1528 04/24/21 1528 04/24/21 2036  GLUCAP 137* 119* 111* 107*     Review of Systems:   Please see the history of present illness. All other systems reviewed and are negative    Past Medical History:  She,  has a past medical history of Bursitis of left hip (10/26/2012), Chronic low back pain, Chronic respiratory failure with hypoxia ([place]) (10/21/2016), Coronary artery calcification (07/03/2016), DDD (degenerative disc disease), Degenerative spondylolisthesis, Emphysema lung ([place]), GERD (gastroesophageal reflux disease), Hiatal hernia, Hyperlipidemia, Hypertension, Hypothyroidism (10/28/2015), Pain in joint, lower leg (10/26/2012), Pneumonia, Pulmonary hypertension ([place]), Sarcoidosis, and Tremors of nervous system.   Surgical History:   Past Surgical History:  Procedure Laterality Date   ANTERIOR LAT LUMBAR FUSION Left 08/29/2014   Procedure: EXTREME LEFT LATERAL INTERBODY FUSION LUMBAR TWO-THREE LATERAL PLATE;  Surgeon: Charlie Pitter, MD;  Location: [facility];  Service: Neurosurgery;  Laterality: Left;   BACK SURGERY  01/15/09   L3-4 and L4-5 decompressive laminectomy with bilateral L3, L4, and L5 decompressive foraminotomies, more than it would be required for simple interbody fusion alone.   BACK SURGERY  01/15/09   L3-4 and L4-5 posterior lumbar interbody fusion utilizing tanget interbody allograft wedge, [place] interbody PEEk cage, and local autografting.   BACK SURGERY  01/15/09   L3, L4, and L5 posterolateral arthrodesis using segmental pedicle screw fixation and local autografting.   CARDIAC CATHETERIZATION     CARPAL TUNNEL RELEASE Bilateral    COLONOSCOPY     ESOPHAGOGASTRODUODENOSCOPY     EYE SURGERY     lens implant   JOINT REPLACEMENT  09/13/09   Right  Hip, Dr. Alvan Dame   KYPHOPLASTY N/A 09/14/2014   Procedure: Lumbar Two Kyphoplasty/Vertebroplasty;  Surgeon: Charlie Pitter, MD;  Location: [facility];  Service: Neurosurgery;  Laterality: N/A;  Lumbar Two Kyphoplasty/Vertebroplasty   RIGHT HEART CATH N/A 11/19/2017   Procedure: RIGHT HEART CATH;  Surgeon: Larey Dresser, MD;  Location: [facility];  Service: Cardiovascular;  Laterality: N/A;   RIGHT HEART CATH N/A 05/27/2019   Procedure: RIGHT HEART CATH;  Surgeon: Larey Dresser, MD;  Location: [facility];  Service: Cardiovascular;  Laterality: N/A;   RIGHT HEART CATH N/A 01/07/2021   Procedure: RIGHT HEART CATH;  Surgeon: Larey Dresser, MD;  Location: [facility];  Service: Cardiovascular;  Laterality: N/A;   THYROIDECTOMY     TOTAL HIP ARTHROPLASTY Left 06/28/2013   Procedure: LEFT TOTAL  HIP ARTHROPLASTY ANTERIOR APPROACH;  Surgeon: Mauri Pole, MD;  Location: [facility];  Service: Orthopedics;  Laterality: Left;     Social History:   reports that she quit smoking about 26 years ago. Her smoking use included cigarettes. She has a 16.00 pack-year smoking history. She has never used smokeless tobacco. She reports that she does not drink alcohol and does not use drugs.   Family History:  Her family history includes Asthma in her maternal grandmother; Cancer (age of onset: 54) in her mother; Heart attack in her mother; Heart disease in her mother; Hyperlipidemia in her mother; Hypertension in her mother.   Allergies Allergies  Allergen Reactions   Lipitor [Atorvastatin] Swelling   Penicillins Swelling    Has patient had a PCN reaction causing immediate rash, facial/tongue/throat swelling, SOB or lightheadedness with hypotension: Yes Has patient had a PCN reaction causing severe rash involving mucus membranes or skin necrosis: No Has patient had a PCN reaction that required hospitalization: No Has patient had a PCN reaction occurring within the last 10 years: No If  all of the above answers are "NO", then may proceed with Cephalosporin use.    Metronidazole Swelling   Pregabalin Other (See Comments)    REACTION: Somnolence and dizziness   Simvastatin Other (See Comments)    Leg pain     Home Medications  Prior to Admission medications   Medication Sig Start Date End Date Taking? Authorizing Provider  acetaminophen (TYLENOL) 500 MG tablet Take 1,000 mg by mouth every 6 (six) hours as needed for moderate pain.    [provider]  albuterol (PROVENTIL) (2.5 MG/3ML) 0.083% nebulizer solution Take 2.5 mg by nebulization every 6 (six) hours as needed for wheezing or shortness of breath.    [provider]  aspirin EC 81 MG tablet Take 81 mg by mouth at bedtime.     [provider]  atenolol (TENORMIN) 25 MG tablet TAKE 1 TABLET (25 MG TOTAL) BY MOUTH AT BEDTIME. TAKE THE 50 MG ATENOLOL IN AM AND THE 25 MG IN PM 08/15/20   Mosie Lukes, MD  atenolol (TENORMIN) 50 MG tablet TAKE 1 TABLET BY MOUTH EVERY DAY 02/01/21   Mosie Lukes, MD  BESIVANCE 0.6 % SUSP Place 1 drop into the left eye See admin instructions. 1 drop into left eye QID for 2 days after eye injection 12/18/14   [provider]  calcium carbonate (OSCAL) 1500 (600 Ca) MG TABS tablet Take 1,500 mg by mouth daily with breakfast.     [provider]  Cholecalciferol (VITAMIN D) 2000 UNITS tablet Take 2,000 Units by mouth every evening.     [provider]  CINNAMON PO Take 2,000 mg by mouth daily.    [provider]  citalopram (CELEXA) 10 MG tablet Take 1 tablet (10 mg total) by mouth daily. 09/14/20   Saguier, Percell Miller, PA-C  Coenzyme Q10 200 MG capsule  Take 200 mg by mouth at bedtime.     [provider]  Fexofenadine HCl (ALLEGRA ALLERGY PO) Take 1 tablet by mouth daily.    [provider]  Fluticasone-Umeclidin-Vilant (TRELEGY ELLIPTA) 100-62.5-25 MCG/INH AEPB Inhale 1 Inhaler into the lungs daily. 02/27/21   Square Jowett,  Bonna Gains, MD  furosemide (LASIX) 40 MG tablet Take 1 tablet (40 mg total) by mouth daily. 03/21/21   Temple Ewart, Bonna Gains, MD  gabapentin (NEURONTIN) 300 MG capsule Take 1 capsule (300 mg total) by mouth at bedtime. 09/14/20   Saguier, Percell Miller, PA-C  hydrochlorothiazide (MICROZIDE) 12.5 MG capsule TAKE 1 CAPSULE BY MOUTH EVERY DAY 02/01/21   Larey Dresser, MD  ipratropium-albuterol (DUONEB) 0.5-2.5 (3) MG/3ML SOLN Take 3 mLs by nebulization every 6 (six) hours. 03/06/21   Langley Flatley, Bonna Gains, MD  ketoconazole (NIZORAL) 2 % shampoo Apply 1 application topically daily as needed for irritation (scalp).    [provider]  levocetirizine (XYZAL) 5 MG tablet TAKE 1 TABLET BY MOUTH EVERY DAY IN THE EVENING 12/06/20   Saguier, Percell Miller, PA-C  levothyroxine (SYNTHROID) 112 MCG tablet TAKE 1 TABLET BY MOUTH EVERY DAY BEFORE BREAKFAST 04/15/21   Mosie Lukes, MD  Omega-3 Fatty Acids (FISH OIL) 1200 MG CAPS Take 1,200 mg by mouth daily.    [provider]  omeprazole (PRILOSEC) 40 MG capsule TAKE 1 CAPSULE BY MOUTH EVERY DAY 04/15/21   Mosie Lukes, MD  potassium chloride (KLOR-CON) 10 MEQ tablet Take 2 tablets (20 mEq total) by mouth daily. 02/27/21   [place], Maricela Bo, FNP  rosuvastatin (CRESTOR) 20 MG tablet TAKE 1 TABLET BY MOUTH EVERY DAY Patient taking differently: Take 20 mg by mouth daily. 02/01/21   Mosie Lukes, MD  Selexipag 1600 MCG TABS Take 1 tablet (1,600 mcg total) by mouth 2 (two) times daily. 01/14/21   Damita Lack, MD  Spacer/Aero-Holding Chambers (AEROCHAMBER PLUS WITH MASK) inhaler Use as instructed 01/28/18   Debbe Odea, MD  tadalafil, PAH, (ADCIRCA) 20 MG tablet TAKE 2 TABLETS BY MOUTH EVERY DAY 10/16/20   Larey Dresser, MD  traMADol (ULTRAM) 50 MG tablet Take 1 tablet (50 mg total) by mouth every 6 (six) hours as needed for moderate pain or severe pain. 01/14/21 01/14/22  Amin, Jeanella Flattery, MD  vitamin B-12 (CYANOCOBALAMIN) 1000 MCG tablet Take 1,000 mcg by  mouth daily.    [provider]     Critical care time:    Performed by: Lanier Clam  Total critical care time: 34 minutes  Critical care time was exclusive of separately billable procedures and treating other patients.  Critical care was necessary to treat or prevent imminent or life-threatening deterioration.  Critical care was time spent personally by me on the following activities: development of treatment plan with patient and/or surrogate as well as nursing, discussions with consultants, evaluation of patient's response to treatment, examination of patient, obtaining history from patient or surrogate, ordering and performing treatments and interventions, ordering and review of laboratory studies, ordering and review of radiographic studies, pulse oximetry and re-evaluation of patient's condition.  Lanier Clam, MD  [facility] Personal contact information can be found on Amion  04/25/2021, 9:15 AM

## 2021-04-26 DIAGNOSIS — R54 Age-related physical debility: Secondary | ICD-10-CM

## 2021-04-26 DIAGNOSIS — J9620 Acute and chronic respiratory failure, unspecified whether with hypoxia or hypercapnia: Secondary | ICD-10-CM | POA: Diagnosis not present

## 2021-04-26 DIAGNOSIS — Z789 Other specified health status: Secondary | ICD-10-CM

## 2021-04-26 DIAGNOSIS — R0902 Hypoxemia: Secondary | ICD-10-CM | POA: Diagnosis not present

## 2021-04-26 DIAGNOSIS — E440 Moderate protein-calorie malnutrition: Secondary | ICD-10-CM

## 2021-04-26 DIAGNOSIS — Z515 Encounter for palliative care: Secondary | ICD-10-CM | POA: Diagnosis not present

## 2021-04-26 DIAGNOSIS — Z7189 Other specified counseling: Secondary | ICD-10-CM | POA: Diagnosis not present

## 2021-04-26 LAB — BASIC METABOLIC PANEL
Anion gap: 12 (ref 5–15)
BUN: 11 mg/dL (ref 8–23)
CO2: 28 mmol/L (ref 22–32)
Calcium: 9 mg/dL (ref 8.9–10.3)
Chloride: 98 mmol/L (ref 98–111)
Creatinine, Ser: 1.39 mg/dL — ABNORMAL HIGH (ref 0.44–1.00)
GFR, Estimated: 39 mL/min — ABNORMAL LOW (ref >60)
Glucose, Bld: 91 mg/dL (ref 70–99)
Potassium: 3.6 mmol/L (ref 3.5–5.1)
Sodium: 138 mmol/L (ref 135–145)

## 2021-04-26 LAB — MAGNESIUM: Magnesium: 1.9 mg/dL (ref 1.7–2.4)

## 2021-04-26 MED ORDER — ENOXAPARIN SODIUM 30 MG/0.3ML IJ SOSY
30.0000 mg | PREFILLED_SYRINGE | INTRAMUSCULAR | Status: DC
  Administered 2021-04-26: 30 mg via SUBCUTANEOUS
  Filled 2021-04-26 (×2): qty 0.3

## 2021-04-26 MED ORDER — POTASSIUM CHLORIDE 20 MEQ PO PACK
40.0000 meq | PACK | Freq: Once | ORAL | Status: DC
  Filled 2021-04-26: qty 2

## 2021-04-26 MED ORDER — POTASSIUM CHLORIDE CRYS ER 20 MEQ PO TBCR: 40.0000 meq | EXTENDED_RELEASE_TABLET | Freq: Once | ORAL | Status: DC

## 2021-04-26 MED ORDER — FUROSEMIDE 10 MG/ML IJ SOLN
40.0000 mg | Freq: Four times a day (QID) | INTRAMUSCULAR | Status: AC
  Administered 2021-04-26 (×2): 40 mg via INTRAVENOUS
  Filled 2021-04-26 (×2): qty 4

## 2021-04-26 NOTE — Progress Notes (Signed)
Daily Progress Note   Patient Name: Kimberly Irwin       Date: 04/26/2021 DOB: May 03, 1946  Age: 75 y.o. MRN#: [id number] Attending Physician: Maryjane Hurter, MD Primary Care Physician: Mosie Lukes, MD Admit Date: 04/21/2021  Reason for Consultation/Follow-up: Disposition and Establishing goals of care  Subjective: Chart review performed. Received report from primary RN - no acute concerns. RN reports patient is withdrawn and sad, poor PO intake.   Went to visit patient at bedside - no family/visitors present. Patient was lying in bed awake, alert, oriented, and able to participate in conversation; however, she is withdrawn. No signs or non-verbal gestures of pain or discomfort noted. No respiratory distress, increased work of breathing, or secretions noted. She is on 8L O2 [place]. Patient denies pain or shortness of breath. She states she feels "alright" today.  Emotional support offered. Reviewed interval history since last PMT visit. Reviewed information discussed at previous PMT visits. Patient tells me that she "hasn't made up her mind yet." She does ask thoughtful questions today around home vs residential hospice - all questions answered. She reiterates her goal would be go to home if she decides on hospice care. We discussed PT recommendations of 24 hour supervision/assistance - patient states her family is able to care for her at home 24/7. However, she does state she worries about her kids and her dogs. I attempted to gain insight into specific concerns, but she does not voice them. Therapeutic listening provided as she tells me about her dogs and how important they are to her. She does wish to be home with them. She asks about discharge timing if she decides on hospice care - questions  answered.  Created space and opportunity for patient to express thoughts and feelings regarding patient's current medical situation. All questions and concerns addressed. Encouraged to call with questions and/or concerns. PMT card provided. Patient is agreeable for PMT follow up tomorrow.  Provided verbal updates to Dr. Verlee Monte and primary RN.  Called patient's daughter/Brenda to offer support. Reviewed information/conversation with patient as outlined above. [name] confirms that patient has 24/7 care with family, which started in June 2022. They have no concerns about caring for patient at home with hospice/for EOL. Provided education and counseling at length on the philosophy and benefits of hospice care. Discussed that  it offers a holistic approach to care in the setting of end-stage illness, and is about supporting the patient where they are allowing nature to take it's course. Discussed the hospice team includes RNs, physicians, social workers, and chaplains. They can provide personal care, support for the family, and help keep patient out of the hospital as well as assist with DME needs for home hospice.   [name] tells me herself, her sister/patient's other daughter/Laura are hopeful to meet with patient tomorrow to discuss current situation. Offered PMT support during visit. PMT will call [name] tomorrow AM to schedule a time for meeting.  All questions and concerns addressed. Encouraged to call with questions and/or concerns. PMT number provided.   Length of Stay: 5  Current Medications: Scheduled Meds:   arformoterol  15 mcg Nebulization BID   aspirin EC  81 mg Oral Daily   budesonide (PULMICORT) nebulizer solution  0.25 mg Nebulization BID   Chlorhexidine Gluconate Cloth  6 each Topical Q0600   citalopram  10 mg Oral Daily   enoxaparin (LOVENOX) injection  30 mg Subcutaneous Q24H   feeding supplement  237 mL Oral Q24H   furosemide  40 mg Intravenous Q6H   Gerhardt's butt cream    Topical BID   levothyroxine  112 mcg Oral Q0600   mouth rinse  15 mL Mouth Rinse BID   pantoprazole  40 mg Oral Daily   potassium chloride  40 mEq Oral Once   revefenacin  175 mcg Nebulization Daily   rosuvastatin  20 mg Oral Daily   Selexipag  1,600 mcg Oral BID   And   Selexipag  200 mcg Oral BID   tadalafil  40 mg Oral Daily    Continuous Infusions:   PRN Meds: docusate sodium, loperamide, polyethylene glycol  Physical Exam Vitals and nursing note reviewed.  Constitutional:      General: She is not in acute distress.    Appearance: She is cachectic. She is ill-appearing.  Pulmonary:     Effort: No respiratory distress.  Skin:    General: Skin is warm and dry.  Neurological:     Mental Status: She is alert and oriented to person, place, and time.     Motor: Weakness present.  Psychiatric:        Attention and Perception: Attention normal.        Mood and Affect: Affect is flat.        Behavior: Behavior is withdrawn. Behavior is cooperative.        Cognition and Memory: Cognition and memory normal.            Vital Signs: BP 97/68 (BP Location: Left Arm)   Pulse 90   Temp 98.2 F (36.8 C) (Oral)   Resp 14   Ht 5' 3.5" (1.613 m)   Wt 61.7 kg   SpO2 92%   BMI 23.72 kg/m  SpO2: SpO2: 92 % O2 Device: O2 Device: High Flow Nasal Cannula O2 Flow Rate: O2 Flow Rate (L/min): 8 L/min  Intake/output summary:  Intake/Output Summary (Last 24 hours) at 04/26/2021 1523 Last data filed at 04/26/2021 0800 Gross per 24 hour  Intake 200 ml  Output 1050 ml  Net -850 ml   LBM: Last BM Date: 04/25/21 Baseline Weight: Weight: 65.4 kg Most recent weight: Weight: 61.7 kg       Palliative Assessment/Data: PPS 20-30%      Patient Active Problem List   Diagnosis Date Noted   Malnutrition of moderate degree 04/23/2021  Hypoxia 04/21/2021   Chronic heart failure with preserved ejection fraction (HFpEF) ([place]) 01/02/2021   COPD with acute exacerbation ([place]) 01/02/2021    Prolonged QT interval 01/02/2021   Diarrhea 09/15/2018   Pulmonary HTN ([place]) 01/28/2018   Acute on chronic respiratory failure with hypoxia ([place]) 01/28/2018   Epistaxis 12/28/2017   Hypoxia, sleep related 10/21/2016   Cardiomegaly 07/13/2016   Coronary artery calcification 07/03/2016   COPD (chronic obstructive pulmonary disease) ([place]) 07/01/2016   Hypersomnia 07/01/2016   Chronic respiratory failure with hypoxia ([place]) 05/20/2016   Dermatitis 04/14/2016   SOB (shortness of breath) 04/03/2016   Hypothyroidism 10/28/2015   Lumbar compression fracture ([place]) 09/14/2014   Medicare annual wellness visit, subsequent 04/27/2014   Obesity    S/P left THA, AA 06/28/2013   Decreased visual acuity 02/19/2013   Hyperglycemia 04/04/2011   Chronic pain syndrome 01/20/2011   Depression 08/10/2008   GERD 04/26/2008   Sinusitis 08/02/2007   Lumbar disc disease 05/31/2007   Sarcoidosis stage I 05/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 female  with past medical history of sarcoidosis (no active disease but remains on steroids due to shortness of breath), chronic hypoxic respiratory failure requiring 8L nasal cannula at baseline, emphysema, pulmonary hypertension, and HFpEF. She presented to the emergency department from home on 04/21/21 with altered mental status and progressive weakness per family. Daughter called EMS reporting that patient's pulse oximetry was unreadable. On arrival to the ED patient was hypotensive and hypoxic with spo2 65% on home 8L. Placed in BiPAP with improvement in oxygenation. However BiPAP was removed when ABG revealed respiratory alkalosis. Chest x-ray consistent with pulmonary edema. Started on vasopressor therapy. Patient also found to have a cardiac arrhythmia concerning for v-tach, however later determined to be cardiac artifact secondary to essential tremor. Admitted to  PCCM.   Assessment: Acute on chronic hypoxic respiratory failure Emphysema Sarcoidosis  Pulmonary hypertension Hypotension Chronic heat failure with preserved EF Essential tremor Hypokalemia  Recommendations/Plan: Continue current medical treatment Continue DNR/DNI as previously documented Patient has not yet made up her mind on discharge home with hospice; however, she did ask very thoughtful questions around this option today. Her goal would be to return home, not a residential hospice facility. Family confirm they can care for her 24/7 as they were doing this already prior to her hospitalization Patient's daughters request a family meeting tomorrow 9/24 - PMT to call in AM to schedule a time PMT will continue to follow and support holistically  Goals of Care and Additional Recommendations: Limitations on Scope of Treatment: Full Scope Treatment, No Artificial Feeding, and No Tracheostomy  Code Status:    Code Status Orders  (From admission, onward)           Start     Ordered   04/21/21 1614  Do not attempt resuscitation (DNR)  Continuous       Question Answer Comment  In the event of cardiac or respiratory ARREST Do not call a "code blue"   In the event of cardiac or respiratory ARREST Do not perform Intubation, CPR, defibrillation or ACLS   In the event of cardiac or respiratory ARREST Use medication by any route, position, wound care, and other measures to relive pain and suffering. May use oxygen, suction and manual treatment of airway obstruction as needed for comfort.      04/21/21 1614  Code Status History     Date Active Date Inactive Code Status Order ID Comments User Context   01/02/2021 2356 01/14/2021 1719 DNR [id number]  Chotiner, Yevonne Aline, MD Inpatient   05/27/2019 1332 05/27/2019 1741 Full Code [id number]  Larey Dresser, MD Inpatient   01/24/2018 2312 01/28/2018 1739 Full Code [id number]  Phillips Grout, MD ED   11/19/2017 1431 11/19/2017 1817  Full Code [id number]  Larey Dresser, MD Inpatient   09/14/2014 2008 09/15/2014 1613 Full Code [id number]  Charlie Pitter, MD Inpatient   08/29/2014 1109 08/30/2014 1330 Full Code [id number]  Charlie Pitter, MD Inpatient   06/28/2013 1318 06/29/2013 1703 Full Code [id number]  Babish, Lucille Passy, PA-C Inpatient      Advance Directive Documentation    Flowsheet Row Most Recent Value  Type of Advance Directive Out of facility DNR (pink MOST or yellow form), Healthcare Power of Attorney, Living will  Pre-existing out of facility DNR order (yellow form or pink MOST form) Pink MOST form placed in chart (order not valid for inpatient use)  "MOST" Form in Place? --       Prognosis:  < 6 months  Discharge Planning: To Be Determined  Care plan was discussed with primary RN, patient, patient's daughter, Dr. Verlee Monte  Thank you for allowing the Palliative Medicine Team to assist in the care of this patient.   Total Time 70 minutes Prolonged Time Billed  yes       Greater than 50%  of this time was spent counseling and coordinating care related to the above assessment and plan.  Lin Landsman, NP  Please contact Palliative Medicine Team phone at [phone] for questions and concerns.

## 2021-04-26 NOTE — TOC Initial Note (Signed)
Transition of Care [facility]) - Initial/Assessment Note    Patient Details  Name: Kimberly Irwin MRN: [id number] Date of Birth: [birth year]  Transition of [facility]) CM/SW Contact:    Verdell Carmine, RN Phone Number: 04/26/2021, 3:53 PM  Clinical Narrative:                  75 Year old patient admitted with [place]  overload respiratory failure. Is on Home oxygen at 8L normally. Palliative care saw for [name]. Has HH at home PT OT Aide. PT currently recommending SNF placement.  Is currently back to baseline oxygen level of 8L. CSW CM will follow for needs, recommendations and transitions.   Expected Discharge Plan: Skilled Nursing Facility Barriers to Discharge: Continued Medical Work up   Patient Goals and CMS Choice        Expected Discharge Plan and Services Expected Discharge Plan: [place] In-house Referral: Clinical Social Work     Living arrangements for the past 2 months: Single Family Home                                      Prior Living Arrangements/Services Living arrangements for the past 2 months: Single Family Home   Patient language and need for interpreter reviewed:: Yes        Need for Family Participation in Patient Care: Yes (Comment) Care giver support system in place?: Yes (comment) Current home services: Home PT, Home OT, Homehealth aide Criminal Activity/Legal Involvement Pertinent to Current Situation/Hospitalization: No - Comment as needed  Activities of Daily Living Home Assistive Devices/Equipment: Wheelchair, Environmental consultant (specify type), Shower chair with back, Shower chair without back ADL Screening (condition at time of admission) Patient's cognitive ability adequate to safely complete daily activities?: Yes Is the patient deaf or have difficulty hearing?: No Does the patient have difficulty seeing, even when wearing glasses/contacts?: No Does the patient have difficulty concentrating, remembering, or making decisions?:  No Patient able to express need for assistance with ADLs?: Yes Does the patient have difficulty dressing or bathing?: Yes Independently performs ADLs?: No Communication: Independent Dressing (OT): Needs assistance Is this a change from baseline?: Pre-admission baseline Grooming: Independent Feeding: Independent Toileting: Needs assistance Is this a change from baseline?: Pre-admission baseline In/Out Bed: Needs assistance Is this a change from baseline?: Pre-admission baseline Walks in Home: Needs assistance Is this a change from baseline?: Pre-admission baseline Does the patient have difficulty walking or climbing stairs?: Yes Weakness of Legs: Both Weakness of Arms/Hands: None  Permission Sought/Granted                  Emotional Assessment         Alcohol / Substance Use: Not Applicable Psych Involvement: No (comment)  Admission diagnosis:  Hypoxia [R09.02] Acute on chronic respiratory failure, unspecified whether with hypoxia or hypercapnia (HCC) [J96.20] Hypotension, unspecified hypotension type [I95.9] Patient Active Problem List   Diagnosis Date Noted   Malnutrition of moderate degree 04/23/2021   Hypoxia 04/21/2021   Chronic heart failure with preserved ejection fraction (HFpEF) ([place]) 01/02/2021   COPD with acute exacerbation ([place]) 01/02/2021   Prolonged QT interval 01/02/2021   Diarrhea 09/15/2018   Pulmonary HTN ([place]) 01/28/2018   Acute on chronic respiratory failure with hypoxia ([place]) 01/28/2018   Epistaxis 12/28/2017   Hypoxia, sleep related 10/21/2016   Cardiomegaly 07/13/2016   Coronary artery calcification 07/03/2016   COPD (chronic obstructive  pulmonary disease) ([place]) 07/01/2016   Hypersomnia 07/01/2016   Chronic respiratory failure with hypoxia ([place]) 05/20/2016   Dermatitis 04/14/2016   SOB (shortness of breath) 04/03/2016   Hypothyroidism 10/28/2015   Lumbar compression fracture ([place]) 09/14/2014   Medicare annual wellness visit, subsequent  04/27/2014   Obesity    S/P left THA, AA 06/28/2013   Decreased visual acuity 02/19/2013   Hyperglycemia 04/04/2011   Chronic pain syndrome 01/20/2011   Depression 08/10/2008   GERD 04/26/2008   Sinusitis 08/02/2007   Lumbar disc disease 05/31/2007   Sarcoidosis stage I 05/29/2007   Hyperlipidemia 05/29/2007   Tremor    Essential hypertension    PCP:  Mosie Lukes, MD Pharmacy:   [id number] [place] [place] [address]Phone: [phone]: [phone] [address] [place] City[place] [address]Phone: [phone]: [phone] [facility] [address] [address]Phone: [phone]: [phone] [address]Phone: [phone]: [phone] [address] [address] WMelina [address] WFredderick SeveranceAIllinois[place]iana[address]Phone: [phone]: 8[place] [address]MS[place]ana Grande[address]Phone: [phone]: [phone]    Social Determinants of Health ([place] Interventions    Readmission Risk Interventions No flowsheet data found.

## 2021-04-26 NOTE — Progress Notes (Addendum)
NAME:  Kimberly Irwin, MRN:  [id number], DOB:  [birth year], LOS: 5 ADMISSION DATE:  04/21/2021, CONSULTATION DATE: 04/21/2021 REFERRING MD:  Dr. Kathrynn Humble, CHIEF COMPLAINT: Hypoxia and hypotension  History of Present Illness:  Kimberly Irwin is a 75 y.o. female with an extensive past medical history significant for but not limited to sarcoidosis (no active disease but remains on steroids due to SOB), chronic hypoxic respiratory failure requiring 8 L nasal cannula at baseline, emphysema, pulmonary hypertension, and HFpEF who presented to the emergency department via EMS today due to complaints of altered behavior and progressive weakness per family.  Daughter states patient had episode of full body shaking but did not lose consciousness or lose function of bowel or bladder.  Patient also had signs of hypoxia with cyanosis observed per family.  On ED arrival patient was seen hypotensive and hypoxic with SPO2 65 on home 8 L nasal cannula.  Patient was placed on BiPAP therapy with improvement in oxygenation.  However BiPAP removed when ABG revealed respiratory alkalosis with pH 7.64, CO2 21.9, bicarb 23.8.  Other pertinent lab work includes potassium 3.4, lactic acid 2.1, WBC 11.5.  Chest x-ray consistent with pulmonary edema.  Additionally while in the emergency department patient was seen with cardiac arrhythmia concerning for V. tach and patient was started on amiodarone drip with electrolyte replacement as patient presented with MOST form explicitly requesting no cardioversion.  Cardiology has been consulted and felt patient's arrhythmia was likely artifact from severe essential tremor and amiodarone drip was stopped.Marland Kitchen   PCCM consulted for further management and admission.  Pertinent  Medical History  Significant for but not limited to sarcoidosis (no active disease but remains on steroids due to SOB), chronic hypoxic respiratory failure requiring 8 L nasal cannula at baseline, emphysema, pulmonary  hypertension, and HFpEF  Significant Hospital Events:  9/19 admitted with acute on chronic respiratory failure likely due to volume overload, weaned to 10L [place], given IV lasix awaiting response 9/20 diuresed well weight down, no change in hypoxemia 9/21 diuresing weight down  Interim History / Subjective:  She doesn't report any dyspnea or pain this morning. Weight fluctuating, 136 lb today.  Objective   Blood pressure 103/86, pulse 85, temperature 98.2 F (36.8 C), temperature source Oral, resp. rate 17, height 5' 3.5" (1.613 m), weight 61.7 kg, SpO2 92 %.        Intake/Output Summary (Last 24 hours) at 04/26/2021 0835 Last data filed at 04/26/2021 0400 Gross per 24 hour  Intake 240 ml  Output 1050 ml  Net -810 ml   Filed Weights   04/24/21 0400 04/25/21 0500 04/26/21 0500  Weight: 60.4 kg 63.5 kg 61.7 kg    Examination: General appearance: 75 y.o., female, NAD, chronically ill appearing Eyes: tracks appropriately, sclera anicteric HENT: NCAT, dry MM Neck: Trachea midline; no LAD Lungs: poor air movement, no wheeze, normal respiratory effort CV: RRR, no MRGs  Abdomen: Soft, non-tender; non-distended, BS present  Extremities: No peripheral edema, radial and DP pulses present bilaterally  Skin: Normal temperature, turgor and texture; no rash Neuro: Alert, follows commands in all extremities, coarse tremor   S Cr stable   Resolved Hospital Problem list     Assessment & Plan:  Acute on chronic hypoxic respiratory failure -On 8 L nasal cannula at baseline Emphysema Sarcoidosis 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 I don't see that  she has chronic hypercapnic respiratory failure --continue lasix IV 40 BID --PT/mobilize to chair  Hypotension:  Chronic. Patient does not appear acutely infected and/or septic nor does she appear in acute cardiogenic  shock.  Per daughter patient's baseline blood pressure ranges in upper 64H systolically. Had been off of steroids for several months prior to admission. --MAP goal 60  Chronic heart failure with preserved EF Hypertension Concern for new arrhythmia including V. Tach --Patient was seen with change in rhythm in emergency department with concern for new arrhythmia however this appears to be artifact from significant essential tremor.  Cardiology stopped amiodarone drip.  Of note however patient was not want cardioversion if arrhythmia does develop. --strict I/O --Daily weight, will attempt to clarify new dry weight, previously 144 lb --continue diuresis as above  Essential tremor: At baseline  Hypokalemia --replace for K of 4  Goals of care Per chart review and discussion with daughter patient has had extensive advanced care planning completed with established DNI/DNR and MOST form.  Palliative care has been consulted to help family further determine acute goals of care including but not limited to desire for aggressive measures including vasopressor support.  We will continue to coordinate with palliative care and family to help patient determine best plan of care.   Best Practice (right click and "Reselect all SmartList Selections" daily)   Diet/type: Regular consistency (see orders) DVT prophylaxis: LMWH GI prophylaxis: N/A Lines: N/A Foley:  N/A Code Status:  DNR Last date of multidisciplinary goals of care discussion: Discussed plan of care at bedside with patient and daughter 9/23. Transfer to telemetry bed.

## 2021-04-26 NOTE — Progress Notes (Signed)
[facility] ADULT ICU REPLACEMENT PROTOCOL   The patient does apply for the [facility] Adult ICU Electrolyte Replacment Protocol based on the criteria listed below:   1.Exclusion criteria: TCTS patients, ECMO patients and Hypothermia Protocol, and   Dialysis patients 2. Is GFR >/= 30 ml/min? Yes.    Patient's GFR today is 39 3. Is SCr </= 2? Yes.   Patient's SCr is 1.39 mg/dL 4. Did SCr increase >/= 0.5 in 24 hours? No. 5.Pt's weight >40kg  Yes.   6. Abnormal electrolyte(s):  K 3.6  7. Electrolytes replaced per protocol 8.  Call MD STAT for K+ </= 2.5, Phos </= 1, or Mag </= 1 Physician:  S. Rosie Fate R Kenden Brandt 04/26/2021 5:59 AM

## 2021-04-27 DIAGNOSIS — Z515 Encounter for palliative care: Secondary | ICD-10-CM | POA: Diagnosis not present

## 2021-04-27 DIAGNOSIS — E440 Moderate protein-calorie malnutrition: Secondary | ICD-10-CM | POA: Diagnosis not present

## 2021-04-27 DIAGNOSIS — J9620 Acute and chronic respiratory failure, unspecified whether with hypoxia or hypercapnia: Secondary | ICD-10-CM | POA: Diagnosis not present

## 2021-04-27 DIAGNOSIS — Z711 Person with feared health complaint in whom no diagnosis is made: Secondary | ICD-10-CM

## 2021-04-27 DIAGNOSIS — I959 Hypotension, unspecified: Secondary | ICD-10-CM | POA: Diagnosis not present

## 2021-04-27 LAB — MAGNESIUM: Magnesium: 1.9 mg/dL (ref 1.7–2.4)

## 2021-04-27 LAB — BASIC METABOLIC PANEL
Anion gap: 12 (ref 5–15)
BUN: 15 mg/dL (ref 8–23)
CO2: 28 mmol/L (ref 22–32)
Calcium: 9 mg/dL (ref 8.9–10.3)
Chloride: 96 mmol/L — ABNORMAL LOW (ref 98–111)
Creatinine, Ser: 1.39 mg/dL — ABNORMAL HIGH (ref 0.44–1.00)
GFR, Estimated: 39 mL/min — ABNORMAL LOW (ref >60)
Glucose, Bld: 85 mg/dL (ref 70–99)
Potassium: 3.1 mmol/L — ABNORMAL LOW (ref 3.5–5.1)
Sodium: 136 mmol/L (ref 135–145)

## 2021-04-27 MED ORDER — MORPHINE SULFATE (CONCENTRATE) 10 MG /0.5 ML PO SOLN: 5.0000 mg | ORAL | 0 refills | Status: AC | PRN

## 2021-04-27 MED ORDER — LORAZEPAM 0.5 MG PO TABS: 0.5000 mg | ORAL_TABLET | ORAL | 0 refills | Status: AC | PRN

## 2021-04-27 MED ORDER — FUROSEMIDE 40 MG PO TABS
40.0000 mg | ORAL_TABLET | Freq: Every day | ORAL | Status: DC
  Administered 2021-04-27: 40 mg via ORAL
  Filled 2021-04-27: qty 1

## 2021-04-27 NOTE — Discharge Summary (Signed)
PATIENT DETAILS Name: Kimberly Irwin Age: 75 y.o. Sex: female Date of Birth: 07/13/1946 MRN: [id number]. Admitting Physician: Spero Geralds, MD GBT:[id number], Bonnita Levan, MD  Admit Date: 04/21/2021 Discharge date: 04/27/2021  Recommendations for Outpatient Follow-up:  Optimize comfort medications. Slowly discontinue PAH medications if and when she continues to decline.  Admitted From:  Home  Disposition: Home with home [place]: No  Equipment/Devices: None  Discharge Condition: hospic  CODE STATUS: DNR/ Comfort Care  Diet recommendation:  Diet Order             Diet - low sodium heart healthy           Diet regular Room service appropriate? Yes; Fluid consistency: Thin  Diet effective now                    Brief Summary: Patient is a 75 y.o. female with history of sarcoidosis, pulmonary hypertension, chronic hypoxic respiratory failure on 8 L of oxygen at home, HFpEF-presented with acute metabolic encephalopathy-she was found to be profoundly hypoxemic likely due to HF PEF with exacerbation--requiring BiPAP-she was admitted to the ICU-diuresed-transition back to nasal cannula and upon further stability-transfer to the [facility] hospitalist service on 9/24.  Brief Hospital Course: HFpEF with exacerbation: Volume status stable-back on usual 8 L of oxygen-transition to oral Lasix.   Acute on chronic hypoxemic respiratory failure: Acute worsening felt to be due to HFpEF with exacerbation-on a background of sarcoidosis/emphysema and pulmonary hypertension.  She was profoundly hypoxemic requiring BiPAP-she has improved with diuretic therapy and has been transitioned to her usual regimen of 8 L via nasal cannula.   Essential tremor: At baseline   Coag negative staph: Likely contamination and not real infection.   Hypokalemia: Repleted   Hypotension: Chronic-no longer on steroids per prior notes-watch closely.   History of sarcoidosis/PAH/emphysema:  Stable-continue usual therapies.   Goals of care: Poor overall prognosis-DNR in place-palliative care follow closely-family meeting held today-recommendations from palliative care to discharge home with hospice care.  Goals of care for comfort.  We will plan to continue her usual medications as she is still relatively awake and alert but suspect that as she continues to deteriorate-we will need to stop/minimize these medications.  We will defer to outpatient hospice team.  Nutrition Status: Nutrition Problem: Moderate Malnutrition Etiology: chronic illness (COPD) Signs/Symptoms: mild muscle depletion, moderate muscle depletion, percent weight loss (11% weight loss within 6 months) Percent weight loss: 11 % Interventions: Ensure Enlive (each supplement provides 350kcal and 20 grams of protein), Magic cup, Snacks, Liberalize Diet    Discharge Diagnoses:  Active Problems:   Hypoxia   Malnutrition of moderate degree   Discharge Instructions:  Activity:  As tolerated with Full fall precautions use walker/cane & assistance as needed   Discharge Instructions     Diet - low sodium heart healthy   Complete by: As directed    Increase activity slowly   Complete by: As directed    No dressing needed   Complete by: As directed       Allergies as of 04/27/2021       Reactions   Lipitor [atorvastatin] Swelling   Penicillins Swelling   Has patient had a PCN reaction causing immediate rash, facial/tongue/throat swelling, SOB or lightheadedness with hypotension: Yes Has patient had a PCN reaction causing severe rash involving mucus membranes or skin necrosis: No Has patient had a PCN reaction that required hospitalization: No Has patient had a PCN  reaction occurring within the last 10 years: No If all of the above answers are "NO", then may proceed with Cephalosporin use.   Metronidazole Swelling   Pregabalin Other (See Comments)   REACTION: Somnolence and dizziness   Simvastatin Other  (See Comments)   Leg pain        Medication List     STOP taking these medications    atenolol 25 MG tablet Commonly known as: TENORMIN   atenolol 50 MG tablet Commonly known as: TENORMIN   calcium carbonate 1500 (600 Ca) MG Tabs tablet Commonly known as: OSCAL   CINNAMON PO   Coenzyme Q10 200 MG capsule   Fish Oil 1200 MG Caps   hydrochlorothiazide 12.5 MG capsule Commonly known as: MICROZIDE   levocetirizine 5 MG tablet Commonly known as: XYZAL   traMADol 50 MG tablet Commonly known as: Ultram   Vitamin D 50 MCG (2000 UT) tablet       TAKE these medications    acetaminophen 500 MG tablet Commonly known as: TYLENOL Take 1,000 mg by mouth every 6 (six) hours as needed for moderate pain.   aerochamber plus with mask inhaler Use as instructed   aspirin EC 81 MG tablet Take 81 mg by mouth at bedtime.   carbamide peroxide 6.5 % OTIC solution Commonly known as: DEBROX Place 5 drops into the left ear 2 (two) times daily as needed (ear pain).   cetirizine 10 MG tablet Commonly known as: ZYRTEC Take 10 mg by mouth daily.   citalopram 10 MG tablet Commonly known as: CELEXA Take 1 tablet (10 mg total) by mouth daily.   furosemide 40 MG tablet Commonly known as: LASIX Take 1 tablet (40 mg total) by mouth daily.   gabapentin 300 MG capsule Commonly known as: NEURONTIN Take 1 capsule (300 mg total) by mouth at bedtime.   guaifenesin 100 MG/5ML syrup Commonly known as: ROBITUSSIN Take 200 mg by mouth 3 (three) times daily as needed for cough.   ipratropium-albuterol 0.5-2.5 (3) MG/3ML Soln Commonly known as: DUONEB Take 3 mLs by nebulization every 6 (six) hours.   ketoconazole 2 % shampoo Commonly known as: NIZORAL Apply 1 application topically daily as needed for irritation (scalp).   levothyroxine 112 MCG tablet Commonly known as: SYNTHROID TAKE 1 TABLET BY MOUTH EVERY DAY BEFORE BREAKFAST What changed: See the new instructions.    loperamide 2 MG tablet Commonly known as: IMODIUM A-D Take 2 mg by mouth 2 (two) times daily as needed for diarrhea or loose stools.   LORazepam 0.5 MG tablet Commonly known as: ATIVAN Take 1 tablet (0.5 mg total) by mouth every hour as needed for anxiety, seizure, sedation or sleep (shortness of breath).   morphine CONCENTRATE 10 mg / 0.5 ml concentrated solution Take 0.25-0.5 mLs (5-10 mg total) by mouth every 2 (two) hours as needed for severe pain, anxiety, shortness of breath or moderate pain.   omeprazole 40 MG capsule Commonly known as: PRILOSEC TAKE 1 CAPSULE BY MOUTH EVERY DAY What changed: how much to take   potassium chloride 10 MEQ tablet Commonly known as: KLOR-CON Take 2 tablets (20 mEq total) by mouth daily.   rosuvastatin 20 MG tablet Commonly known as: CRESTOR TAKE 1 TABLET BY MOUTH EVERY DAY   Selexipag 200 MCG Tabs Take 200 mcg by mouth 2 (two) times daily. Take with 1663mg to equal 18030m twice daily. What changed: Another medication with the same name was changed. Make sure you understand how and when to  take each.   Selexipag 1600 MCG Tabs Take 1 tablet (1,600 mcg total) by mouth 2 (two) times daily. What changed: additional instructions   tadalafil (PAH) 20 MG tablet Commonly known as: ADCIRCA TAKE 2 TABLETS BY MOUTH EVERY DAY What changed:  how to take this when to take this   Trelegy Ellipta 100-62.5-25 MCG/INH Aepb Generic drug: Fluticasone-Umeclidin-Vilant Inhale 1 Inhaler into the lungs daily.   vitamin B-12 1000 MCG tablet Commonly known as: CYANOCOBALAMIN Take 5,000 mcg by mouth every other day.               Discharge Care Instructions  (From admission, onward)           Start     Ordered   04/27/21 0000  No dressing needed        04/27/21 1439            Allergies  Allergen Reactions   Lipitor [Atorvastatin] Swelling   Penicillins Swelling    Has patient had a PCN reaction causing immediate rash,  facial/tongue/throat swelling, SOB or lightheadedness with hypotension: Yes Has patient had a PCN reaction causing severe rash involving mucus membranes or skin necrosis: No Has patient had a PCN reaction that required hospitalization: No Has patient had a PCN reaction occurring within the last 10 years: No If all of the above answers are "NO", then may proceed with Cephalosporin use.    Metronidazole Swelling   Pregabalin Other (See Comments)    REACTION: Somnolence and dizziness   Simvastatin Other (See Comments)    Leg pain      Consultations:  PCCM/Palliative   Other Procedures/Studies: DG Chest Portable 1 View  Result Date: 04/21/2021 CLINICAL DATA:  Hypoxia EXAM: PORTABLE CHEST 1 VIEW COMPARISON:  02/08/2021 FINDINGS: Unchanged gross cardiomegaly. Mild, diffuse bilateral interstitial pulmonary opacity. No new or focal airspace opacity. Osseous structures are unremarkable. IMPRESSION: Unchanged gross cardiomegaly. Mild, diffuse bilateral interstitial pulmonary opacity, likely edema. No new or focal airspace opacity. Electronically Signed   By: Eddie Candle M.D.   On: 04/21/2021 13:49   VAS [place] LOWER EXTREMITY VENOUS (DVT)  Result Date: 04/24/2021  Lower Venous DVT Study Patient Name:  Kimberly Irwin  Date of Exam:   04/24/2021 Medical Rec #: [id number]      Accession #:    [id number] Date of Birth: 09-15-1945       Patient Gender: F Patient Age:   32 years Exam Location:  [facility] Procedure:      VAS [place] LOWER EXTREMITY VENOUS (DVT) Referring Phys: Larey Days --------------------------------------------------------------------------------  Indications: Edema.  Comparison Study: No prior study Performing Technologist: Maudry Mayhew MHA, RDMS, RVT, RDCS  Examination Guidelines: A complete evaluation includes B-mode imaging, spectral Doppler, color Doppler, and power Doppler as needed of all accessible portions of each vessel. Bilateral testing is considered an integral  part of a complete examination. Limited examinations for reoccurring indications may be performed as noted. The reflux portion of the exam is performed with the patient in reverse Trendelenburg.  +---------+---------------+---------+-----------+----------+--------------+ RIGHT    CompressibilityPhasicitySpontaneityPropertiesThrombus Aging +---------+---------------+---------+-----------+----------+--------------+ CFV      Full           Yes      Yes                                 +---------+---------------+---------+-----------+----------+--------------+ SFJ      Full                                                        +---------+---------------+---------+-----------+----------+--------------+  FV Prox  Full                                                        +---------+---------------+---------+-----------+----------+--------------+ FV Mid   Full                                                        +---------+---------------+---------+-----------+----------+--------------+ FV DistalFull                                                        +---------+---------------+---------+-----------+----------+--------------+ PFV      Full                                                        +---------+---------------+---------+-----------+----------+--------------+ POP      Full           Yes      Yes                                 +---------+---------------+---------+-----------+----------+--------------+ PTV      Full                                                        +---------+---------------+---------+-----------+----------+--------------+ PERO     Full                                                        +---------+---------------+---------+-----------+----------+--------------+   +---------+---------------+---------+-----------+----------+--------------+ LEFT     CompressibilityPhasicitySpontaneityPropertiesThrombus Aging  +---------+---------------+---------+-----------+----------+--------------+ CFV      Full           Yes      Yes                                 +---------+---------------+---------+-----------+----------+--------------+ SFJ      Full                                                        +---------+---------------+---------+-----------+----------+--------------+ FV Prox  Full                                                        +---------+---------------+---------+-----------+----------+--------------+  FV Mid   Full                                                        +---------+---------------+---------+-----------+----------+--------------+ FV DistalFull                                                        +---------+---------------+---------+-----------+----------+--------------+ PFV      Full                                                        +---------+---------------+---------+-----------+----------+--------------+ POP      Full           Yes      Yes                                 +---------+---------------+---------+-----------+----------+--------------+ PTV      Full                                                        +---------+---------------+---------+-----------+----------+--------------+ PERO     Full                                                        +---------+---------------+---------+-----------+----------+--------------+     Summary: RIGHT: - There is no evidence of deep vein thrombosis in the lower extremity.  - No cystic structure found in the popliteal fossa.  LEFT: - There is no evidence of deep vein thrombosis in the lower extremity.  - No cystic structure found in the popliteal fossa.  *See table(s) above for measurements and observations. Electronically signed by Harold Barban MD on 04/24/2021 at 7:27:38 PM.    Final      TODAY-DAY OF DISCHARGE:  Subjective:   Kimberly Irwin today has no headache,no chest  abdominal pain,no new weakness tingling or numbness, feels much better wants to go home today.   Objective:   Blood pressure 109/85, pulse 85, temperature 97.9 F (36.6 C), temperature source Oral, resp. rate (!) 22, height 5' 3.5" (1.613 m), weight 59.3 kg, SpO2 91 %.  Intake/Output Summary (Last 24 hours) at 04/27/2021 1451 Last data filed at 04/27/2021 0943 Gross per 24 hour  Intake 120 ml  Output 500 ml  Net -380 ml   Filed Weights   04/25/21 0500 04/26/21 0500 04/27/21 0500  Weight: 63.5 kg 61.7 kg 59.3 kg    Exam: Awake Alert, Oriented *3, No new F.N deficits, Normal affect [place].AT,PERRAL Supple Neck,No JVD, No cervical lymphadenopathy appriciated.  Symmetrical Chest wall movement, Good air movement bilaterally, CTAB RRR,No Gallops,Rubs or new Murmurs, No Parasternal Heave +  ve B.Sounds, Abd Soft, Non tender, No organomegaly appriciated, No rebound -guarding or rigidity. No Cyanosis, Clubbing or edema, No new Rash or bruise   PERTINENT RADIOLOGIC STUDIES: No results found.   PERTINENT LAB RESULTS: CBC: No results for input(s): WBC, HGB, HCT, PLT in the last 72 hours. CMET CMP     Component Value Date/Time   NA 136 04/27/2021 0153   NA 143 09/07/2018 1710   K 3.1 (L) 04/27/2021 0153   CL 96 (L) 04/27/2021 0153   CO2 28 04/27/2021 0153   GLUCOSE 85 04/27/2021 0153   BUN 15 04/27/2021 0153   BUN 17 09/07/2018 1710   CREATININE 1.39 (H) 04/27/2021 0153   CREATININE 1.20 (H) 11/30/2020 1523   CALCIUM 9.0 04/27/2021 0153   PROT 5.5 (L) 04/21/2021 1309   PROT 6.4 09/07/2018 1710   ALBUMIN 2.6 (L) 04/21/2021 1309   ALBUMIN 4.1 09/07/2018 1710   AST 17 04/21/2021 1309   ALT 8 04/21/2021 1309   ALKPHOS 40 04/21/2021 1309   BILITOT 0.6 04/21/2021 1309   BILITOT 0.4 09/07/2018 1710   GFRNONAA 39 (L) 04/27/2021 0153   GFRNONAA 51 (L) 04/22/2019 1618   GFRAA 59 (L) 05/10/2019 1621   GFRAA 59 (L) 04/22/2019 1618    GFR Estimated Creatinine Clearance: 28.7 mL/min  (A) (by C-G formula based on SCr of 1.39 mg/dL (H)). No results for input(s): LIPASE, AMYLASE in the last 72 hours. No results for input(s): CKTOTAL, CKMB, CKMBINDEX, TROPONINI in the last 72 hours. Invalid input(s): POCBNP No results for input(s): DDIMER in the last 72 hours. No results for input(s): HGBA1C in the last 72 hours. No results for input(s): CHOL, HDL, LDLCALC, TRIG, CHOLHDL, LDLDIRECT in the last 72 hours. No results for input(s): TSH, T4TOTAL, T3FREE, THYROIDAB in the last 72 hours.  Invalid input(s): FREET3 No results for input(s): VITAMINB12, FOLATE, FERRITIN, TIBC, IRON, RETICCTPCT in the last 72 hours. Coags: No results for input(s): INR in the last 72 hours.  Invalid input(s): PT Microbiology: Recent Results (from the past 240 hour(s))  Resp Panel by RT-PCR (Flu A&B, Covid) Nasopharyngeal Swab     Status: None   Collection Time: 04/21/21  1:02 PM   Specimen: Nasopharyngeal Swab; Nasopharyngeal(NP) swabs in vial transport medium  Result Value Ref Range Status   SARS Coronavirus 2 by RT PCR NEGATIVE NEGATIVE Final    Comment: (NOTE) SARS-CoV-2 target nucleic acids are NOT DETECTED.  The SARS-CoV-2 RNA is generally detectable in upper respiratory specimens during the acute phase of infection. The lowest concentration of SARS-CoV-2 viral copies this assay can detect is 138 copies/mL. A negative result does not preclude SARS-Cov-2 infection and should not be used as the sole basis for treatment or other patient management decisions. A negative result may occur with  improper specimen collection/handling, submission of specimen other than nasopharyngeal swab, presence of viral mutation(s) within the areas targeted by this assay, and inadequate number of viral copies(<138 copies/mL). A negative result must be combined with clinical observations, patient history, and epidemiological information. The expected result is Negative.  Fact Sheet for Patients:   EntrepreneurPulse.com.au  Fact Sheet for Healthcare Providers:  IncredibleEmployment.be  This test is no t yet approved or cleared by the [place] FDA and  has been authorized for detection and/or diagnosis of SARS-CoV-2 by FDA under an Emergency Use Authorization (EUA). This EUA will remain  in effect (meaning this test can be used) for the duration of the COVID-19 declaration under Section 564(b)(1) of the  Act, 21 U.S.C.section 360bbb-3(b)(1), unless the authorization is terminated  or revoked sooner.       Influenza A by PCR NEGATIVE NEGATIVE Final   Influenza B by PCR NEGATIVE NEGATIVE Final    Comment: (NOTE) The Xpert Xpress SARS-CoV-2/FLU/RSV plus assay is intended as an aid in the diagnosis of influenza from Nasopharyngeal swab specimens and should not be used as a sole basis for treatment. Nasal washings and aspirates are unacceptable for Xpert Xpress SARS-CoV-2/FLU/RSV testing.  Fact Sheet for Patients: EntrepreneurPulse.com.au  Fact Sheet for Healthcare Providers: IncredibleEmployment.be  This test is not yet approved or cleared by the [place] FDA and has been authorized for detection and/or diagnosis of SARS-CoV-2 by FDA under an Emergency Use Authorization (EUA). This EUA will remain in effect (meaning this test can be used) for the duration of the COVID-19 declaration under Section 564(b)(1) of the Act, 21 U.S.C. section 360bbb-3(b)(1), unless the authorization is terminated or revoked.  Performed at [facility], [place] [address] [place], [place] [address]   Blood culture (routine x 2)     Status: Abnormal   Collection Time: 04/21/21  1:24 PM   Specimen: BLOOD LEFT FOREARM  Result Value Ref Range Status   Specimen Description BLOOD LEFT FOREARM  Final   Special Requests   Final    BOTTLES DRAWN AEROBIC AND ANAEROBIC Blood Culture adequate volume   Culture  Setup Time    Final    GRAM POSITIVE COCCI IN CLUSTERS IN BOTH AEROBIC AND ANAEROBIC BOTTLES CRITICAL RESULT CALLED TO, READ BACK BY AND VERIFIED WITH: PHARM D A.LAWLESS ON [id number] AT 0813 BY E.PARRISH    Culture (A)  Final    STAPHYLOCOCCUS EPIDERMIDIS THE SIGNIFICANCE OF ISOLATING THIS ORGANISM FROM A SINGLE SET OF BLOOD CULTURES WHEN MULTIPLE SETS ARE DRAWN IS UNCERTAIN. PLEASE NOTIFY THE MICROBIOLOGY DEPARTMENT WITHIN ONE WEEK IF SPECIATION AND SENSITIVITIES ARE REQUIRED. Performed at [facility], [place] [address], [place], [place] [address]    Report Status 04/24/2021 FINAL  Final  Blood Culture ID Panel (Reflexed)     Status: Abnormal   Collection Time: 04/21/21  1:24 PM  Result Value Ref Range Status   Enterococcus faecalis NOT DETECTED NOT DETECTED Final   Enterococcus Faecium NOT DETECTED NOT DETECTED Final   Listeria monocytogenes NOT DETECTED NOT DETECTED Final   Staphylococcus species DETECTED (A) NOT DETECTED Final    Comment: CRITICAL RESULT CALLED TO, READ BACK BY AND VERIFIED WITH: PHARM D A.LAWLESS ON [id number] AT 0813 BY E.PARRISH    Staphylococcus aureus (BCID) NOT DETECTED NOT DETECTED Final   Staphylococcus epidermidis DETECTED (A) NOT DETECTED Final    Comment: CRITICAL RESULT CALLED TO, READ BACK BY AND VERIFIED WITH: PHARM D A.LAWLESS ON [id number] AT 0813 BY E.PARRISH    Staphylococcus lugdunensis NOT DETECTED NOT DETECTED Final   Streptococcus species NOT DETECTED NOT DETECTED Final   Streptococcus agalactiae NOT DETECTED NOT DETECTED Final   Streptococcus pneumoniae NOT DETECTED NOT DETECTED Final   Streptococcus pyogenes NOT DETECTED NOT DETECTED Final   A.calcoaceticus-baumannii NOT DETECTED NOT DETECTED Final   Bacteroides fragilis NOT DETECTED NOT DETECTED Final   Enterobacterales NOT DETECTED NOT DETECTED Final   Enterobacter cloacae complex NOT DETECTED NOT DETECTED Final   Escherichia coli NOT DETECTED NOT DETECTED Final   Klebsiella aerogenes NOT DETECTED  NOT DETECTED Final   Klebsiella oxytoca NOT DETECTED NOT DETECTED Final   Klebsiella pneumoniae NOT DETECTED NOT DETECTED Final   Proteus species NOT  DETECTED NOT DETECTED Final   Salmonella species NOT DETECTED NOT DETECTED Final   Serratia marcescens NOT DETECTED NOT DETECTED Final   Haemophilus influenzae NOT DETECTED NOT DETECTED Final   Neisseria meningitidis NOT DETECTED NOT DETECTED Final   Pseudomonas aeruginosa NOT DETECTED NOT DETECTED Final   Stenotrophomonas maltophilia NOT DETECTED NOT DETECTED Final   Candida albicans NOT DETECTED NOT DETECTED Final   Candida auris NOT DETECTED NOT DETECTED Final   Candida glabrata NOT DETECTED NOT DETECTED Final   Candida krusei NOT DETECTED NOT DETECTED Final   Candida parapsilosis NOT DETECTED NOT DETECTED Final   Candida tropicalis NOT DETECTED NOT DETECTED Final   Cryptococcus neoformans/gattii NOT DETECTED NOT DETECTED Final   Methicillin resistance mecA/C NOT DETECTED NOT DETECTED Final    Comment: Performed at [facility], [place] [address] [place], [place] [address]  Blood culture (routine x 2)     Status: None (Preliminary result)   Collection Time: 04/21/21  1:25 PM   Specimen: BLOOD RIGHT ARM  Result Value Ref Range Status   Specimen Description BLOOD RIGHT ARM  Final   Special Requests   Final    BOTTLES DRAWN AEROBIC AND ANAEROBIC Blood Culture adequate volume   Culture   Final    NO GROWTH 4 DAYS Performed at [facility], [place] [address], [place], [place] [address]    Report Status PENDING  Incomplete  MRSA Next Gen by PCR, Nasal     Status: None   Collection Time: 04/21/21  8:04 PM   Specimen: Nasal Mucosa; Nasal Swab  Result Value Ref Range Status   MRSA by PCR Next Gen NOT DETECTED NOT DETECTED Final    Comment: (NOTE) The GeneXpert MRSA Assay (FDA approved for NASAL specimens only), is one component of a comprehensive MRSA colonization surveillance program. It is not intended to diagnose MRSA  infection nor to guide or monitor treatment for MRSA infections. Test performance is not FDA approved in patients less than 1 years old. Performed at [facility], [place] [address], [place], [place] [address]   Respiratory (~20 pathogens) panel by PCR     Status: None   Collection Time: 04/21/21  9:04 PM   Specimen: Nasopharyngeal Swab; Respiratory  Result Value Ref Range Status   Adenovirus NOT DETECTED NOT DETECTED Final   Coronavirus 229E NOT DETECTED NOT DETECTED Final    Comment: (NOTE) The Coronavirus on the Respiratory Panel, DOES NOT test for the novel  Coronavirus (2019 nCoV)    Coronavirus HKU1 NOT DETECTED NOT DETECTED Final   Coronavirus NL63 NOT DETECTED NOT DETECTED Final   Coronavirus OC43 NOT DETECTED NOT DETECTED Final   Metapneumovirus NOT DETECTED NOT DETECTED Final   Rhinovirus / Enterovirus NOT DETECTED NOT DETECTED Final   Influenza A NOT DETECTED NOT DETECTED Final   Influenza B NOT DETECTED NOT DETECTED Final   Parainfluenza Virus 1 NOT DETECTED NOT DETECTED Final   Parainfluenza Virus 2 NOT DETECTED NOT DETECTED Final   Parainfluenza Virus 3 NOT DETECTED NOT DETECTED Final   Parainfluenza Virus 4 NOT DETECTED NOT DETECTED Final   Respiratory Syncytial Virus NOT DETECTED NOT DETECTED Final   Bordetella pertussis NOT DETECTED NOT DETECTED Final   Bordetella Parapertussis NOT DETECTED NOT DETECTED Final   Chlamydophila pneumoniae NOT DETECTED NOT DETECTED Final   Mycoplasma pneumoniae NOT DETECTED NOT DETECTED Final    Comment: Performed at [facility], [place]. [address] [place], [place] [address]  FURTHER DISCHARGE INSTRUCTIONS:  Get Medicines reviewed and adjusted: Please take all your medications with you for your next visit with your Primary MD  Laboratory/radiological data: Please request your Primary MD to go over all hospital tests and procedure/radiological results at the follow up, please ask your Primary MD to get all Hospital  records sent to his/her office.  In some cases, they will be blood work, cultures and biopsy results pending at the time of your discharge. Please request that your primary care M.D. goes through all the records of your hospital data and follows up on these results.  Also Note the following: If you experience worsening of your admission symptoms, develop shortness of breath, life threatening emergency, suicidal or homicidal thoughts you must seek medical attention immediately by calling 911 or calling your MD immediately  if symptoms less severe.  You must read complete instructions/literature along with all the possible adverse reactions/side effects for all the Medicines you take and that have been prescribed to you. Take any new Medicines after you have completely understood and accpet all the possible adverse reactions/side effects.   Do not drive when taking Pain medications or sleeping medications (Benzodaizepines)  Do not take more than prescribed Pain, Sleep and Anxiety Medications. It is not advisable to combine anxiety,sleep and pain medications without talking with your primary care practitioner  Special Instructions: If you have smoked or chewed Tobacco  in the last 2 yrs please stop smoking, stop any regular Alcohol  and or any Recreational drug use.  Wear Seat belts while driving.  Please note: You were cared for by a hospitalist during your hospital stay. Once you are discharged, your primary care physician will handle any further medical issues. Please note that NO REFILLS for any discharge medications will be authorized once you are discharged, as it is imperative that you return to your primary care physician (or establish a relationship with a primary care physician if you do not have one) for your post hospital discharge needs so that they can reassess your need for medications and monitor your lab values.  Total Time spent coordinating discharge including counseling,  education and face to face time equals 35 minutes.  SignedOren Binet 04/27/2021 2:51 PM

## 2021-04-27 NOTE — TOC Transition Note (Signed)
Transition of Care [facility]) - CM/SW Discharge Note   Patient Details  Name: Kimberly Irwin MRN: [id number] Date of Birth: 1946-06-15  Transition of [facility]) CM/SW Contact:  Bartholomew Crews, RN Phone Number: [phone] 04/27/2021, 2:34 PM   Clinical Narrative:     Notified by Luetta Nutting with palliative that family wants to transition patient home with hospice care ([place]). Spoke with patient's daughter, [name], to verify home location - [address] [place], [place] [address]. Referral placed with Fraser Din at [employer].   Final next level of care: Home w Hospice Care Barriers to Discharge: No Barriers Identified   Patient Goals and CMS Choice        Discharge Placement                       Discharge Plan and Services In-house Referral: Clinical Social Work                                   Social Determinants of Health (SDOH) Interventions     Readmission Risk Interventions No flowsheet data found.

## 2021-04-27 NOTE — Progress Notes (Signed)
PROGRESS NOTE        PATIENT DETAILS Name: Kimberly Irwin Age: 75 y.o. Sex: female Date of Birth: 1946-07-26 Admit Date: 04/21/2021 Admitting Physician Spero Geralds, MD [id number], Bonnita Levan, MD  Brief Narrative: Patient is a 75 y.o. female with history of sarcoidosis, pulmonary hypertension, chronic hypoxic respiratory failure on 8 L of oxygen at home, HFpEF-presented with acute metabolic encephalopathy-she was found to be profoundly hypoxemic likely due to HF PEF with exacerbation--requiring BiPAP-she was admitted to the ICU-diuresed-transition back to nasal cannula and upon further stability-transfer to the [facility]ist service on 9/24.  Subjective: Lying comfortably in bed-denies any chest pain or shortness of breath.  Looks very frail-has essential tremors.  Objective: Vitals: Blood pressure 127/75, pulse 86, temperature 97.9 F (36.6 C), temperature source Oral, resp. rate 19, height 5' 3.5" (1.613 m), weight 59.3 kg, SpO2 90 %.   Exam: Gen Exam:Alert awake-not in any distress HEENT:atraumatic, normocephalic Chest: B/L clear to auscultation anteriorly CVS:S1S2 regular Abdomen:soft non tender, non distended Extremities:no edema Neurology: Non focal Skin: no rash  Pertinent Labs/Radiology: WBC: 7.6 Hb: 12.0 PLT: 211 Na: 136  K: 3.1 Creatinine: 1.39  9/18>>Blood culture: 1/2 staff epidermidis  Assessment/Plan: HFpEF with exacerbation: Volume status stable-back on usual 8 L of oxygen-transition to oral Lasix.  Acute on chronic hypoxemic respiratory failure: Acute worsening felt to be due to HFpEF with exacerbation-on a background of sarcoidosis/emphysema and pulmonary hypertension.  She was profoundly hypoxemic requiring BiPAP-she has improved with diuretic therapy and has been transitioned to her usual regimen of 8 L via nasal cannula.  Essential tremor: At baseline  Coag negative staph: Likely contamination and not real  infection.  Hypokalemia: Replete and recheck  Hypotension: Chronic-no longer on steroids per prior notes-watch closely.  History of sarcoidosis/PAH/emphysema: Stable-continue usual therapies.  Goals of care: Poor overall prognosis-DNR in place-palliative care planning to meet with patient/family today-Home hospice being contemplated.  Will await further recommendations.   Nutrition Status: Nutrition Problem: Moderate Malnutrition Etiology: chronic illness (COPD) Signs/Symptoms: mild muscle depletion, moderate muscle depletion, percent weight loss (11% weight loss within 6 months) Percent weight loss: 11 % Interventions: Ensure Enlive (each supplement provides 350kcal and 20 grams of protein), Magic cup, Snacks, Liberalize Diet  Estimated body mass index is 22.79 kg/m as calculated from the following:   Height as of this encounter: 5' 3.5" (1.613 m).   Weight as of this encounter: 59.3 kg.    Procedures: None Consults: PCCM,Palliative DVT Prophylaxis: Lovenox Code Status:DNR Family Communication: None at bedside  Time spent: 25- minutes-Greater than 50% of this time was spent in counseling, explanation of diagnosis, planning of further management, and coordination of care.  Diet: Diet Order             Diet regular Room service appropriate? Yes; Fluid consistency: Thin  Diet effective now                      Disposition Plan: Status is: Inpatient  Remains inpatient appropriate because:Inpatient level of care appropriate due to severity of illness  Dispo: The patient is from: Home              Anticipated d/c is to: Home              Patient currently is not medically stable to d/c.   Difficult  to place patient No    Barriers to Discharge: Improving hypoxia-not yet at baseline-palliative care discussion goals of care/appropriate disposition.  Antimicrobial agents: Anti-infectives (From admission, onward)    None        MEDICATIONS: Scheduled  Meds:  arformoterol  15 mcg Nebulization BID   aspirin EC  81 mg Oral Daily   budesonide (PULMICORT) nebulizer solution  0.25 mg Nebulization BID   Chlorhexidine Gluconate Cloth  6 each Topical Q0600   citalopram  10 mg Oral Daily   enoxaparin (LOVENOX) injection  30 mg Subcutaneous Q24H   feeding supplement  237 mL Oral Q24H   Gerhardt's butt cream   Topical BID   levothyroxine  112 mcg Oral Q0600   mouth rinse  15 mL Mouth Rinse BID   pantoprazole  40 mg Oral Daily   potassium chloride  40 mEq Oral Once   revefenacin  175 mcg Nebulization Daily   rosuvastatin  20 mg Oral Daily   Selexipag  1,600 mcg Oral BID   And   Selexipag  200 mcg Oral BID   tadalafil  40 mg Oral Daily   Continuous Infusions: PRN Meds:.docusate sodium, loperamide, polyethylene glycol   I have personally reviewed following labs and imaging studies  LABORATORY DATA: CBC: Recent Labs  Lab 04/21/21 1309 04/21/21 1317 04/21/21 1613 04/22/21 0014 04/22/21 0042 04/23/21 0355 04/24/21 0129  WBC 11.5*  --  9.0  --  6.9 8.2 7.6  NEUTROABS 9.2*  --   --   --   --   --   --   HGB 12.0   < > 11.4* 9.5* 10.9* 11.6* 12.0  HCT 36.5   < > 34.1* 28.0* 31.8* 34.8* 35.8*  MCV 90.8  --  90.5  --  89.1 90.4 89.3  PLT 210  --  183  --  177 197 211   < > = values in this interval not displayed.    Basic Metabolic Panel: Recent Labs  Lab 04/22/21 0042 04/22/21 1635 04/23/21 0355 04/23/21 1609 04/24/21 0129 04/24/21 1515 04/25/21 0804 04/26/21 0055 04/27/21 0153  NA 134*   < > 134* 137 138  --  137 138 136  K 3.4*   < > 4.4 3.9 3.6 4.1 3.8 3.6 3.1*  CL 100   < > 101 101 101  --  98 98 96*  CO2 25   < > _0 --  _1 GLUCOSE 89   < > 97 104* 95  --  92 91 85  BUN 18   < > _2 --  _3 CREATININE 1.39*   < > 1.21* 1.26* 1.32*  --  1.33* 1.39* 1.39*  CALCIUM 8.5*   < > 9.1 9.1 9.0  --  9.0 9.0 9.0  MG 2.1  --  1.9  --  1.8  --  2.2 1.9 1.9  PHOS 3.1  --   --   --   --   --   --   --    --    < > = values in this interval not displayed.    GFR: Estimated Creatinine Clearance: 28.7 mL/min (A) (by C-G formula based on SCr of 1.39 mg/dL (H)).  Liver Function Tests: Recent Labs  Lab 04/21/21 1309  AST 17  ALT 8  ALKPHOS 40  BILITOT 0.6  PROT 5.5*  ALBUMIN 2.6*   No results for input(s): LIPASE, AMYLASE in the  last 168 hours. No results for input(s): AMMONIA in the last 168 hours.  Coagulation Profile: No results for input(s): INR, PROTIME in the last 168 hours.  Cardiac Enzymes: No results for input(s): CKTOTAL, CKMB, CKMBINDEX, TROPONINI in the last 168 hours.  BNP (last 3 results) No results for input(s): PROBNP in the last 8760 hours.  Lipid Profile: No results for input(s): CHOL, HDL, LDLCALC, TRIG, CHOLHDL, LDLDIRECT in the last 72 hours.  Thyroid Function Tests: No results for input(s): TSH, T4TOTAL, FREET4, T3FREE, THYROIDAB in the last 72 hours.  Anemia Panel: No results for input(s): VITAMINB12, FOLATE, FERRITIN, TIBC, IRON, RETICCTPCT in the last 72 hours.  Urine analysis:    Component Value Date/Time   COLORURINE YELLOW 04/21/2021 1728   APPEARANCEUR CLEAR 04/21/2021 1728   LABSPEC 1.020 04/21/2021 1728   PHURINE 5.5 04/21/2021 1728   GLUCOSEU NEGATIVE 04/21/2021 1728   GLUCOSEU NEGATIVE 08/28/2014 1122   HGBUR NEGATIVE 04/21/2021 1728   HGBUR negative 06/01/2008 1356   BILIRUBINUR NEGATIVE 04/21/2021 1728   KETONESUR NEGATIVE 04/21/2021 1728   PROTEINUR NEGATIVE 04/21/2021 1728   UROBILINOGEN 0.2 08/28/2014 1122   NITRITE NEGATIVE 04/21/2021 1728   LEUKOCYTESUR NEGATIVE 04/21/2021 1728    Sepsis Labs: Lactic Acid, Venous    Component Value Date/Time   LATICACIDVEN 1.5 04/21/2021 2047    MICROBIOLOGY: Recent Results (from the past 240 hour(s))  Resp Panel by RT-PCR (Flu A&B, Covid) Nasopharyngeal Swab     Status: None   Collection Time: 04/21/21  1:02 PM   Specimen: Nasopharyngeal Swab; Nasopharyngeal(NP) swabs in vial  transport medium  Result Value Ref Range Status   SARS Coronavirus 2 by RT PCR NEGATIVE NEGATIVE Final    Comment: (NOTE) SARS-CoV-2 target nucleic acids are NOT DETECTED.  The SARS-CoV-2 RNA is generally detectable in upper respiratory specimens during the acute phase of infection. The lowest concentration of SARS-CoV-2 viral copies this assay can detect is 138 copies/mL. A negative result does not preclude SARS-Cov-2 infection and should not be used as the sole basis for treatment or other patient management decisions. A negative result may occur with  improper specimen collection/handling, submission of specimen other than nasopharyngeal swab, presence of viral mutation(s) within the areas targeted by this assay, and inadequate number of viral copies(<138 copies/mL). A negative result must be combined with clinical observations, patient history, and epidemiological information. The expected result is Negative.  Fact Sheet for Patients:  EntrepreneurPulse.com.au  Fact Sheet for Healthcare Providers:  IncredibleEmployment.be  This test is no t yet approved or cleared by the [place] FDA and  has been authorized for detection and/or diagnosis of SARS-CoV-2 by FDA under an Emergency Use Authorization (EUA). This EUA will remain  in effect (meaning this test can be used) for the duration of the COVID-19 declaration under Section 564(b)(1) of the Act, 21 U.S.C.section 360bbb-3(b)(1), unless the authorization is terminated  or revoked sooner.       Influenza A by PCR NEGATIVE NEGATIVE Final   Influenza B by PCR NEGATIVE NEGATIVE Final    Comment: (NOTE) The Xpert Xpress SARS-CoV-2/FLU/RSV plus assay is intended as an aid in the diagnosis of influenza from Nasopharyngeal swab specimens and should not be used as a sole basis for treatment. Nasal washings and aspirates are unacceptable for Xpert Xpress SARS-CoV-2/FLU/RSV testing.  Fact  Sheet for Patients: EntrepreneurPulse.com.au  Fact Sheet for Healthcare Providers: IncredibleEmployment.be  This test is not yet approved or cleared by the [place] FDA and has been authorized for detection  and/or diagnosis of SARS-CoV-2 by FDA under an Emergency Use Authorization (EUA). This EUA will remain in effect (meaning this test can be used) for the duration of the COVID-19 declaration under Section 564(b)(1) of the Act, 21 U.S.C. section 360bbb-3(b)(1), unless the authorization is terminated or revoked.  Performed at [facility], [place] [address] [place], [place] [address]   Blood culture (routine x 2)     Status: Abnormal   Collection Time: 04/21/21  1:24 PM   Specimen: BLOOD LEFT FOREARM  Result Value Ref Range Status   Specimen Description BLOOD LEFT FOREARM  Final   Special Requests   Final    BOTTLES DRAWN AEROBIC AND ANAEROBIC Blood Culture adequate volume   Culture  Setup Time   Final    GRAM POSITIVE COCCI IN CLUSTERS IN BOTH AEROBIC AND ANAEROBIC BOTTLES CRITICAL RESULT CALLED TO, READ BACK BY AND VERIFIED WITH: PHARM D A.LAWLESS ON [id number] AT 0813 BY E.PARRISH    Culture (A)  Final    STAPHYLOCOCCUS EPIDERMIDIS THE SIGNIFICANCE OF ISOLATING THIS ORGANISM FROM A SINGLE SET OF BLOOD CULTURES WHEN MULTIPLE SETS ARE DRAWN IS UNCERTAIN. PLEASE NOTIFY THE MICROBIOLOGY DEPARTMENT WITHIN ONE WEEK IF SPECIATION AND SENSITIVITIES ARE REQUIRED. Performed at [facility], [place] [address], [place], [place] [address]    Report Status 04/24/2021 FINAL  Final  Blood Culture ID Panel (Reflexed)     Status: Abnormal   Collection Time: 04/21/21  1:24 PM  Result Value Ref Range Status   Enterococcus faecalis NOT DETECTED NOT DETECTED Final   Enterococcus Faecium NOT DETECTED NOT DETECTED Final   Listeria monocytogenes NOT DETECTED NOT DETECTED Final   Staphylococcus species DETECTED (A) NOT DETECTED Final    Comment:  CRITICAL RESULT CALLED TO, READ BACK BY AND VERIFIED WITH: PHARM D A.LAWLESS ON [id number] AT 0813 BY E.PARRISH    Staphylococcus aureus (BCID) NOT DETECTED NOT DETECTED Final   Staphylococcus epidermidis DETECTED (A) NOT DETECTED Final    Comment: CRITICAL RESULT CALLED TO, READ BACK BY AND VERIFIED WITH: PHARM D A.LAWLESS ON [id number] AT 0813 BY E.PARRISH    Staphylococcus lugdunensis NOT DETECTED NOT DETECTED Final   Streptococcus species NOT DETECTED NOT DETECTED Final   Streptococcus agalactiae NOT DETECTED NOT DETECTED Final   Streptococcus pneumoniae NOT DETECTED NOT DETECTED Final   Streptococcus pyogenes NOT DETECTED NOT DETECTED Final   A.calcoaceticus-baumannii NOT DETECTED NOT DETECTED Final   Bacteroides fragilis NOT DETECTED NOT DETECTED Final   Enterobacterales NOT DETECTED NOT DETECTED Final   Enterobacter cloacae complex NOT DETECTED NOT DETECTED Final   Escherichia coli NOT DETECTED NOT DETECTED Final   Klebsiella aerogenes NOT DETECTED NOT DETECTED Final   Klebsiella oxytoca NOT DETECTED NOT DETECTED Final   Klebsiella pneumoniae NOT DETECTED NOT DETECTED Final   Proteus species NOT DETECTED NOT DETECTED Final   Salmonella species NOT DETECTED NOT DETECTED Final   Serratia marcescens NOT DETECTED NOT DETECTED Final   Haemophilus influenzae NOT DETECTED NOT DETECTED Final   Neisseria meningitidis NOT DETECTED NOT DETECTED Final   Pseudomonas aeruginosa NOT DETECTED NOT DETECTED Final   Stenotrophomonas maltophilia NOT DETECTED NOT DETECTED Final   Candida albicans NOT DETECTED NOT DETECTED Final   Candida auris NOT DETECTED NOT DETECTED Final   Candida glabrata NOT DETECTED NOT DETECTED Final   Candida krusei NOT DETECTED NOT DETECTED Final   Candida parapsilosis NOT DETECTED NOT DETECTED Final   Candida tropicalis NOT DETECTED NOT DETECTED Final   Cryptococcus neoformans/gattii  NOT DETECTED NOT DETECTED Final   Methicillin resistance mecA/C NOT DETECTED NOT DETECTED  Final    Comment: Performed at [facility], [place] [address] [place], [place] [address]  Blood culture (routine x 2)     Status: None (Preliminary result)   Collection Time: 04/21/21  1:25 PM   Specimen: BLOOD RIGHT ARM  Result Value Ref Range Status   Specimen Description BLOOD RIGHT ARM  Final   Special Requests   Final    BOTTLES DRAWN AEROBIC AND ANAEROBIC Blood Culture adequate volume   Culture   Final    NO GROWTH 4 DAYS Performed at [facility], [place] [address], [place], [place] [address]    Report Status PENDING  Incomplete  MRSA Next Gen by PCR, Nasal     Status: None   Collection Time: 04/21/21  8:04 PM   Specimen: Nasal Mucosa; Nasal Swab  Result Value Ref Range Status   MRSA by PCR Next Gen NOT DETECTED NOT DETECTED Final    Comment: (NOTE) The GeneXpert MRSA Assay (FDA approved for NASAL specimens only), is one component of a comprehensive MRSA colonization surveillance program. It is not intended to diagnose MRSA infection nor to guide or monitor treatment for MRSA infections. Test performance is not FDA approved in patients less than 54 years old. Performed at [facility], [place] [address], [place], [place] [address]   Respiratory (~20 pathogens) panel by PCR     Status: None   Collection Time: 04/21/21  9:04 PM   Specimen: Nasopharyngeal Swab; Respiratory  Result Value Ref Range Status   Adenovirus NOT DETECTED NOT DETECTED Final   Coronavirus 229E NOT DETECTED NOT DETECTED Final    Comment: (NOTE) The Coronavirus on the Respiratory Panel, DOES NOT test for the novel  Coronavirus (2019 nCoV)    Coronavirus HKU1 NOT DETECTED NOT DETECTED Final   Coronavirus NL63 NOT DETECTED NOT DETECTED Final   Coronavirus OC43 NOT DETECTED NOT DETECTED Final   Metapneumovirus NOT DETECTED NOT DETECTED Final   Rhinovirus / Enterovirus NOT DETECTED NOT DETECTED Final   Influenza A NOT DETECTED NOT DETECTED Final   Influenza B NOT DETECTED NOT DETECTED Final    Parainfluenza Virus 1 NOT DETECTED NOT DETECTED Final   Parainfluenza Virus 2 NOT DETECTED NOT DETECTED Final   Parainfluenza Virus 3 NOT DETECTED NOT DETECTED Final   Parainfluenza Virus 4 NOT DETECTED NOT DETECTED Final   Respiratory Syncytial Virus NOT DETECTED NOT DETECTED Final   Bordetella pertussis NOT DETECTED NOT DETECTED Final   Bordetella Parapertussis NOT DETECTED NOT DETECTED Final   Chlamydophila pneumoniae NOT DETECTED NOT DETECTED Final   Mycoplasma pneumoniae NOT DETECTED NOT DETECTED Final    Comment: Performed at [facility], [place]. [address], [place], [place] [address]    RADIOLOGY STUDIES/RESULTS: No results found.   LOS: 6 days   Oren Binet, MD  [facility]    To contact the attending provider between 7A-7P or the covering provider during after hours 7P-7A, please log into the web site [email or web address] and access using universal [place] password for that web site. If you do not have the password, please call the hospital operator.  04/27/2021, 11:54 AM

## 2021-04-27 NOTE — Progress Notes (Signed)
Daily Progress Note   Patient Name: Kimberly Irwin       Date: 04/27/2021 DOB: [birth year]  Age: 75 y.o. MRN#: [id number] Attending Physician: Jonetta Osgood, MD Primary Care Physician: Mosie Lukes, MD Admit Date: 04/21/2021  Reason for Consultation/Follow-up: Establishing goals of care  Subjective: Chart review performed.   11:28 AM Eula Fried to offer support and set up family meeting for today. [name] states she and [name] are at patient's bedside discussing options now. At this time, family would like to continue speaking privately. [name] will call PMT later if they have additional questions/would like PMT involvement in [place] conversations.   1:32 PM Notified [name] called PMT phone and requested PMT call/visit family and patient at bedside.   Received report from primary RN - no acute concerns.   Went to visit patient at bedside - daughter/Brenda and daughter/Laura were present. Patient was lying in bed awake, alert, and able to participate in conversation; however she does remain withdrawn. Family provide updates that patient has decided to return home with hospice services. Family validate they are comfortable continuing to provide 24/7 care as they previously did prior to hospitalization and are comfortable with providing EOL care with hospice. Family request [facility]. Discussed DME needs - patient will need a hospital bed, but patient states she is comfortable going home prior to it's delivery. Patient and family do not want to hold discharge waiting for bed. Patient and family are hopeful for discharge today or as soon as possible.   Education provided on comfort medication that will be ordered at discharge - all questions answered. Confirmed pharmacy listed in EMR is  correct.   All questions and concerns addressed. Encouraged to call with questions and/or concerns. PMT card provided.  Reviewed updated plan with Dr. Sloan Leiter, [facility], and primary RN. Discharge comfort medication orders placed.  Length of Stay: 6  Current Medications: Scheduled Meds:   arformoterol  15 mcg Nebulization BID   aspirin EC  81 mg Oral Daily   budesonide (PULMICORT) nebulizer solution  0.25 mg Nebulization BID   Chlorhexidine Gluconate Cloth  6 each Topical Q0600   citalopram  10 mg Oral Daily   enoxaparin (LOVENOX) injection  30 mg Subcutaneous Q24H   feeding supplement  237 mL Oral Q24H   Gerhardt's butt cream  Topical BID   levothyroxine  112 mcg Oral Q0600   mouth rinse  15 mL Mouth Rinse BID   pantoprazole  40 mg Oral Daily   potassium chloride  40 mEq Oral Once   revefenacin  175 mcg Nebulization Daily   rosuvastatin  20 mg Oral Daily   Selexipag  1,600 mcg Oral BID   And   Selexipag  200 mcg Oral BID   tadalafil  40 mg Oral Daily    Continuous Infusions:   PRN Meds: docusate sodium, loperamide, polyethylene glycol  Physical Exam Vitals and nursing note reviewed.  Constitutional:      General: She is not in acute distress.    Appearance: She is cachectic. She is ill-appearing.  Pulmonary:     Effort: No respiratory distress.  Skin:    General: Skin is warm and dry.  Neurological:     Mental Status: She is alert and oriented to person, place, and time.     Motor: Weakness present.  Psychiatric:        Attention and Perception: Attention normal.        Mood and Affect: Affect is flat.        Behavior: Behavior is withdrawn. Behavior is cooperative.        Cognition and Memory: Cognition and memory normal.            Vital Signs: BP 127/81   Pulse 84   Temp 97.9 F (36.6 C) (Oral)   Resp (!) 23   Ht 5' 3.5" (1.613 m)   Wt 59.3 kg   SpO2 92%   BMI 22.79 kg/m  SpO2: SpO2: 92 % O2 Device: O2 Device: High Flow Nasal Cannula ([place]) O2 Flow  Rate: O2 Flow Rate (L/min): (S) 10 L/min  Intake/output summary:  Intake/Output Summary (Last 24 hours) at 04/27/2021 1127 Last data filed at 04/27/2021 5701 Gross per 24 hour  Intake 120 ml  Output 500 ml  Net -380 ml   LBM: Last BM Date: 04/25/21 Baseline Weight: Weight: 65.4 kg Most recent weight: Weight: 59.3 kg       Palliative Assessment/Data: PPS 20-30%      Patient Active Problem List   Diagnosis Date Noted   Malnutrition of moderate degree 04/23/2021   Hypoxia 04/21/2021   Chronic heart failure with preserved ejection fraction (HFpEF) ([place]) 01/02/2021   COPD with acute exacerbation ([place]) 01/02/2021   Prolonged QT interval 01/02/2021   Diarrhea 09/15/2018   Pulmonary HTN ([place]) 01/28/2018   Acute on chronic respiratory failure with hypoxia ([facility]) 01/28/2018   Epistaxis 12/28/2017   Hypoxia, sleep related 10/21/2016   Cardiomegaly 07/13/2016   Coronary artery calcification 07/03/2016   COPD (chronic obstructive pulmonary disease) ([place]) 07/01/2016   Hypersomnia 07/01/2016   Chronic respiratory failure with hypoxia ([place]) 05/20/2016   Dermatitis 04/14/2016   SOB (shortness of breath) 04/03/2016   Hypothyroidism 10/28/2015   Lumbar compression fracture ([place]) 09/14/2014   Medicare annual wellness visit, subsequent 04/27/2014   Obesity    S/P left THA, AA 06/28/2013   Decreased visual acuity 02/19/2013   Hyperglycemia 04/04/2011   Chronic pain syndrome 01/20/2011   Depression 08/10/2008   GERD 04/26/2008   Sinusitis 08/02/2007   Lumbar disc disease 05/31/2007   Sarcoidosis stage I 05/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 female  with past medical history of sarcoidosis (no active disease  but remains on steroids due to shortness of breath), chronic hypoxic respiratory failure requiring 8L nasal cannula at baseline, emphysema, pulmonary hypertension, and HFpEF. She  presented to the emergency department from home on 04/21/21 with altered mental status and progressive weakness per family. Daughter called EMS reporting that patient's pulse oximetry was unreadable. On arrival to the ED patient was hypotensive and hypoxic with spo2 65% on home 8L. Placed in BiPAP with improvement in oxygenation. However BiPAP was removed when ABG revealed respiratory alkalosis. Chest x-ray consistent with pulmonary edema. Started on vasopressor therapy. Patient also found to have a cardiac arrhythmia concerning for v-tach, however later determined to be cardiac artifact secondary to essential tremor. Admitted to PCCM.   Assessment: Acute on chronic hypoxic respiratory failure Emphysema Sarcoidosis  Pulmonary hypertension Hypotension Chronic heat failure with preserved EF Essential tremor Hypokalemia  Recommendations/Plan: Patient has decided for discharge home with hospice and is hopeful for discharge today. They need hospital bed delivered, but do not want to hold up discharge waiting for it - ok with discharge prior to hospital bed delivery Family are ready for patient to return home as soon as possible and can continue providing the 24/7 care she will need TOC notified and consulted for: home hospice referral - family requesting [facility] Discharge comfort medications sent to pharmacy PMT will continue to follow peripherally. If there are any imminent needs please call the service directly   Goals of Care and Additional Recommendations: Limitations on Scope of Treatment: Avoid Hospitalization  Code Status:    Code Status Orders  (From admission, onward)           Start     Ordered   04/21/21 1614  Do not attempt resuscitation (DNR)  Continuous       Question Answer Comment  In the event of cardiac or respiratory ARREST Do not call a "code blue"   In the event of cardiac or respiratory ARREST Do not perform Intubation, CPR, defibrillation or ACLS    In the event of cardiac or respiratory ARREST Use medication by any route, position, wound care, and other measures to relive pain and suffering. May use oxygen, suction and manual treatment of airway obstruction as needed for comfort.      04/21/21 1614           Code Status History     Date Active Date Inactive Code Status Order ID Comments User Context   01/02/2021 2356 01/14/2021 1719 DNR [id number]  Chotiner, Yevonne Aline, MD Inpatient   05/27/2019 1332 05/27/2019 1741 Full Code [id number]  Larey Dresser, MD Inpatient   01/24/2018 2312 01/28/2018 1739 Full Code [id number]  Phillips Grout, MD ED   11/19/2017 1431 11/19/2017 1817 Full Code [id number]  Larey Dresser, MD Inpatient   09/14/2014 2008 09/15/2014 1613 Full Code [id number]  Charlie Pitter, MD Inpatient   08/29/2014 1109 08/30/2014 1330 Full Code [id number]  Charlie Pitter, MD Inpatient   06/28/2013 1318 06/29/2013 1703 Full Code [id number]  Babish, Lucille Passy, PA-C Inpatient      Advance Directive Documentation    Flowsheet Row Most Recent Value  Type of Advance Directive Out of facility DNR (pink MOST or yellow form), Healthcare Power of Attorney, Living will  Pre-existing out of facility DNR order (yellow form or pink MOST form) Pink MOST form placed in chart (order not valid for inpatient use)  "MOST" Form in Place? --       Prognosis:  < 3  months  Discharge Planning: Home with Hospice  Care plan was discussed with primary RN, Dr. Sloan Leiter, [facility], patient's family  Thank you for allowing the Palliative Medicine Team to assist in the care of this patient.   Total Time 45 minutes Prolonged Time Billed  no       Greater than 50%  of this time was spent counseling and coordinating care related to the above assessment and plan.  Lin Landsman, NP  Please contact Palliative Medicine Team phone at [phone] for questions and concerns.

## 2021-04-28 DIAGNOSIS — G250 Essential tremor: Secondary | ICD-10-CM | POA: Diagnosis not present

## 2021-04-28 DIAGNOSIS — I2721 Secondary pulmonary arterial hypertension: Secondary | ICD-10-CM | POA: Diagnosis not present

## 2021-04-28 DIAGNOSIS — Z9981 Dependence on supplemental oxygen: Secondary | ICD-10-CM | POA: Diagnosis not present

## 2021-04-28 DIAGNOSIS — E440 Moderate protein-calorie malnutrition: Secondary | ICD-10-CM | POA: Diagnosis not present

## 2021-04-28 DIAGNOSIS — E039 Hypothyroidism, unspecified: Secondary | ICD-10-CM | POA: Diagnosis not present

## 2021-04-28 DIAGNOSIS — D860 Sarcoidosis of lung: Secondary | ICD-10-CM | POA: Diagnosis not present

## 2021-04-28 DIAGNOSIS — I110 Hypertensive heart disease with heart failure: Secondary | ICD-10-CM | POA: Diagnosis not present

## 2021-04-28 DIAGNOSIS — J439 Emphysema, unspecified: Secondary | ICD-10-CM | POA: Diagnosis not present

## 2021-04-28 DIAGNOSIS — K219 Gastro-esophageal reflux disease without esophagitis: Secondary | ICD-10-CM | POA: Diagnosis not present

## 2021-04-28 DIAGNOSIS — Z87891 Personal history of nicotine dependence: Secondary | ICD-10-CM | POA: Diagnosis not present

## 2021-04-28 DIAGNOSIS — I5032 Chronic diastolic (congestive) heart failure: Secondary | ICD-10-CM | POA: Diagnosis not present

## 2021-04-29 DIAGNOSIS — Z9981 Dependence on supplemental oxygen: Secondary | ICD-10-CM | POA: Diagnosis not present

## 2021-04-29 DIAGNOSIS — J439 Emphysema, unspecified: Secondary | ICD-10-CM | POA: Diagnosis not present

## 2021-04-29 DIAGNOSIS — I2721 Secondary pulmonary arterial hypertension: Secondary | ICD-10-CM | POA: Diagnosis not present

## 2021-04-29 DIAGNOSIS — I5032 Chronic diastolic (congestive) heart failure: Secondary | ICD-10-CM | POA: Diagnosis not present

## 2021-04-29 DIAGNOSIS — D860 Sarcoidosis of lung: Secondary | ICD-10-CM | POA: Diagnosis not present

## 2021-04-29 DIAGNOSIS — I110 Hypertensive heart disease with heart failure: Secondary | ICD-10-CM | POA: Diagnosis not present

## 2021-04-29 LAB — CULTURE, BLOOD (ROUTINE X 2)
Culture: NO GROWTH
Special Requests: ADEQUATE

## 2021-04-30 DIAGNOSIS — D860 Sarcoidosis of lung: Secondary | ICD-10-CM | POA: Diagnosis not present

## 2021-04-30 DIAGNOSIS — I110 Hypertensive heart disease with heart failure: Secondary | ICD-10-CM | POA: Diagnosis not present

## 2021-04-30 DIAGNOSIS — Z9981 Dependence on supplemental oxygen: Secondary | ICD-10-CM | POA: Diagnosis not present

## 2021-04-30 DIAGNOSIS — I2721 Secondary pulmonary arterial hypertension: Secondary | ICD-10-CM | POA: Diagnosis not present

## 2021-04-30 DIAGNOSIS — I5032 Chronic diastolic (congestive) heart failure: Secondary | ICD-10-CM | POA: Diagnosis not present

## 2021-04-30 DIAGNOSIS — J439 Emphysema, unspecified: Secondary | ICD-10-CM | POA: Diagnosis not present

## 2021-05-01 DIAGNOSIS — J439 Emphysema, unspecified: Secondary | ICD-10-CM | POA: Diagnosis not present

## 2021-05-01 DIAGNOSIS — I2721 Secondary pulmonary arterial hypertension: Secondary | ICD-10-CM | POA: Diagnosis not present

## 2021-05-01 DIAGNOSIS — D860 Sarcoidosis of lung: Secondary | ICD-10-CM | POA: Diagnosis not present

## 2021-05-01 DIAGNOSIS — I5032 Chronic diastolic (congestive) heart failure: Secondary | ICD-10-CM | POA: Diagnosis not present

## 2021-05-01 DIAGNOSIS — I110 Hypertensive heart disease with heart failure: Secondary | ICD-10-CM | POA: Diagnosis not present

## 2021-05-01 DIAGNOSIS — Z9981 Dependence on supplemental oxygen: Secondary | ICD-10-CM | POA: Diagnosis not present

## 2021-05-03 DIAGNOSIS — Z9981 Dependence on supplemental oxygen: Secondary | ICD-10-CM | POA: Diagnosis not present

## 2021-05-03 DIAGNOSIS — J439 Emphysema, unspecified: Secondary | ICD-10-CM | POA: Diagnosis not present

## 2021-05-03 DIAGNOSIS — D860 Sarcoidosis of lung: Secondary | ICD-10-CM | POA: Diagnosis not present

## 2021-05-03 DIAGNOSIS — I5032 Chronic diastolic (congestive) heart failure: Secondary | ICD-10-CM | POA: Diagnosis not present

## 2021-05-03 DIAGNOSIS — I110 Hypertensive heart disease with heart failure: Secondary | ICD-10-CM | POA: Diagnosis not present

## 2021-05-03 DIAGNOSIS — I2721 Secondary pulmonary arterial hypertension: Secondary | ICD-10-CM | POA: Diagnosis not present

## 2021-05-04 DIAGNOSIS — I2721 Secondary pulmonary arterial hypertension: Secondary | ICD-10-CM | POA: Diagnosis not present

## 2021-05-04 DIAGNOSIS — D860 Sarcoidosis of lung: Secondary | ICD-10-CM | POA: Diagnosis not present

## 2021-05-04 DIAGNOSIS — E039 Hypothyroidism, unspecified: Secondary | ICD-10-CM | POA: Diagnosis not present

## 2021-05-04 DIAGNOSIS — I110 Hypertensive heart disease with heart failure: Secondary | ICD-10-CM | POA: Diagnosis not present

## 2021-05-04 DIAGNOSIS — G250 Essential tremor: Secondary | ICD-10-CM | POA: Diagnosis not present

## 2021-05-04 DIAGNOSIS — Z9981 Dependence on supplemental oxygen: Secondary | ICD-10-CM | POA: Diagnosis not present

## 2021-05-04 DIAGNOSIS — E440 Moderate protein-calorie malnutrition: Secondary | ICD-10-CM | POA: Diagnosis not present

## 2021-05-04 DIAGNOSIS — J439 Emphysema, unspecified: Secondary | ICD-10-CM | POA: Diagnosis not present

## 2021-05-04 DIAGNOSIS — I5032 Chronic diastolic (congestive) heart failure: Secondary | ICD-10-CM | POA: Diagnosis not present

## 2021-05-04 DIAGNOSIS — K219 Gastro-esophageal reflux disease without esophagitis: Secondary | ICD-10-CM | POA: Diagnosis not present

## 2021-05-04 DIAGNOSIS — Z87891 Personal history of nicotine dependence: Secondary | ICD-10-CM | POA: Diagnosis not present

## 2021-05-07 DIAGNOSIS — I2721 Secondary pulmonary arterial hypertension: Secondary | ICD-10-CM | POA: Diagnosis not present

## 2021-05-07 DIAGNOSIS — I5032 Chronic diastolic (congestive) heart failure: Secondary | ICD-10-CM | POA: Diagnosis not present

## 2021-05-07 DIAGNOSIS — Z9981 Dependence on supplemental oxygen: Secondary | ICD-10-CM | POA: Diagnosis not present

## 2021-05-07 DIAGNOSIS — I110 Hypertensive heart disease with heart failure: Secondary | ICD-10-CM | POA: Diagnosis not present

## 2021-05-07 DIAGNOSIS — J439 Emphysema, unspecified: Secondary | ICD-10-CM | POA: Diagnosis not present

## 2021-05-07 DIAGNOSIS — D860 Sarcoidosis of lung: Secondary | ICD-10-CM | POA: Diagnosis not present

## 2021-05-08 ENCOUNTER — Encounter (HOSPITAL_COMMUNITY): Payer: Medicare Other | Admitting: Cardiology

## 2021-05-09 DIAGNOSIS — I2721 Secondary pulmonary arterial hypertension: Secondary | ICD-10-CM | POA: Diagnosis not present

## 2021-05-09 DIAGNOSIS — I5032 Chronic diastolic (congestive) heart failure: Secondary | ICD-10-CM | POA: Diagnosis not present

## 2021-05-09 DIAGNOSIS — J439 Emphysema, unspecified: Secondary | ICD-10-CM | POA: Diagnosis not present

## 2021-05-09 DIAGNOSIS — I110 Hypertensive heart disease with heart failure: Secondary | ICD-10-CM | POA: Diagnosis not present

## 2021-05-09 DIAGNOSIS — Z9981 Dependence on supplemental oxygen: Secondary | ICD-10-CM | POA: Diagnosis not present

## 2021-05-09 DIAGNOSIS — D860 Sarcoidosis of lung: Secondary | ICD-10-CM | POA: Diagnosis not present

## 2021-05-10 DIAGNOSIS — I110 Hypertensive heart disease with heart failure: Secondary | ICD-10-CM | POA: Diagnosis not present

## 2021-05-10 DIAGNOSIS — D860 Sarcoidosis of lung: Secondary | ICD-10-CM | POA: Diagnosis not present

## 2021-05-10 DIAGNOSIS — J439 Emphysema, unspecified: Secondary | ICD-10-CM | POA: Diagnosis not present

## 2021-05-10 DIAGNOSIS — I5032 Chronic diastolic (congestive) heart failure: Secondary | ICD-10-CM | POA: Diagnosis not present

## 2021-05-10 DIAGNOSIS — Z9981 Dependence on supplemental oxygen: Secondary | ICD-10-CM | POA: Diagnosis not present

## 2021-05-10 DIAGNOSIS — I2721 Secondary pulmonary arterial hypertension: Secondary | ICD-10-CM | POA: Diagnosis not present

## 2021-05-15 DIAGNOSIS — D860 Sarcoidosis of lung: Secondary | ICD-10-CM | POA: Diagnosis not present

## 2021-05-15 DIAGNOSIS — I2721 Secondary pulmonary arterial hypertension: Secondary | ICD-10-CM | POA: Diagnosis not present

## 2021-05-15 DIAGNOSIS — I110 Hypertensive heart disease with heart failure: Secondary | ICD-10-CM | POA: Diagnosis not present

## 2021-05-15 DIAGNOSIS — I5032 Chronic diastolic (congestive) heart failure: Secondary | ICD-10-CM | POA: Diagnosis not present

## 2021-05-15 DIAGNOSIS — J439 Emphysema, unspecified: Secondary | ICD-10-CM | POA: Diagnosis not present

## 2021-05-15 DIAGNOSIS — Z9981 Dependence on supplemental oxygen: Secondary | ICD-10-CM | POA: Diagnosis not present

## 2021-05-17 DIAGNOSIS — Z9981 Dependence on supplemental oxygen: Secondary | ICD-10-CM | POA: Diagnosis not present

## 2021-05-17 DIAGNOSIS — I110 Hypertensive heart disease with heart failure: Secondary | ICD-10-CM | POA: Diagnosis not present

## 2021-05-17 DIAGNOSIS — J439 Emphysema, unspecified: Secondary | ICD-10-CM | POA: Diagnosis not present

## 2021-05-17 DIAGNOSIS — I5032 Chronic diastolic (congestive) heart failure: Secondary | ICD-10-CM | POA: Diagnosis not present

## 2021-05-17 DIAGNOSIS — D860 Sarcoidosis of lung: Secondary | ICD-10-CM | POA: Diagnosis not present

## 2021-05-17 DIAGNOSIS — I2721 Secondary pulmonary arterial hypertension: Secondary | ICD-10-CM | POA: Diagnosis not present

## 2021-05-20 DIAGNOSIS — Z9981 Dependence on supplemental oxygen: Secondary | ICD-10-CM | POA: Diagnosis not present

## 2021-05-20 DIAGNOSIS — I2721 Secondary pulmonary arterial hypertension: Secondary | ICD-10-CM | POA: Diagnosis not present

## 2021-05-20 DIAGNOSIS — J439 Emphysema, unspecified: Secondary | ICD-10-CM | POA: Diagnosis not present

## 2021-05-20 DIAGNOSIS — I110 Hypertensive heart disease with heart failure: Secondary | ICD-10-CM | POA: Diagnosis not present

## 2021-05-20 DIAGNOSIS — I5032 Chronic diastolic (congestive) heart failure: Secondary | ICD-10-CM | POA: Diagnosis not present

## 2021-05-20 DIAGNOSIS — D860 Sarcoidosis of lung: Secondary | ICD-10-CM | POA: Diagnosis not present

## 2021-05-21 DIAGNOSIS — J439 Emphysema, unspecified: Secondary | ICD-10-CM | POA: Diagnosis not present

## 2021-05-21 DIAGNOSIS — I5032 Chronic diastolic (congestive) heart failure: Secondary | ICD-10-CM | POA: Diagnosis not present

## 2021-05-21 DIAGNOSIS — I2721 Secondary pulmonary arterial hypertension: Secondary | ICD-10-CM | POA: Diagnosis not present

## 2021-05-21 DIAGNOSIS — D860 Sarcoidosis of lung: Secondary | ICD-10-CM | POA: Diagnosis not present

## 2021-05-21 DIAGNOSIS — I110 Hypertensive heart disease with heart failure: Secondary | ICD-10-CM | POA: Diagnosis not present

## 2021-05-21 DIAGNOSIS — Z9981 Dependence on supplemental oxygen: Secondary | ICD-10-CM | POA: Diagnosis not present

## 2021-05-24 DIAGNOSIS — D860 Sarcoidosis of lung: Secondary | ICD-10-CM | POA: Diagnosis not present

## 2021-05-24 DIAGNOSIS — Z9981 Dependence on supplemental oxygen: Secondary | ICD-10-CM | POA: Diagnosis not present

## 2021-05-24 DIAGNOSIS — I110 Hypertensive heart disease with heart failure: Secondary | ICD-10-CM | POA: Diagnosis not present

## 2021-05-24 DIAGNOSIS — I2721 Secondary pulmonary arterial hypertension: Secondary | ICD-10-CM | POA: Diagnosis not present

## 2021-05-24 DIAGNOSIS — I5032 Chronic diastolic (congestive) heart failure: Secondary | ICD-10-CM | POA: Diagnosis not present

## 2021-05-24 DIAGNOSIS — J439 Emphysema, unspecified: Secondary | ICD-10-CM | POA: Diagnosis not present

## 2021-05-29 DIAGNOSIS — I5032 Chronic diastolic (congestive) heart failure: Secondary | ICD-10-CM | POA: Diagnosis not present

## 2021-05-29 DIAGNOSIS — Z9981 Dependence on supplemental oxygen: Secondary | ICD-10-CM | POA: Diagnosis not present

## 2021-05-29 DIAGNOSIS — I110 Hypertensive heart disease with heart failure: Secondary | ICD-10-CM | POA: Diagnosis not present

## 2021-05-29 DIAGNOSIS — J439 Emphysema, unspecified: Secondary | ICD-10-CM | POA: Diagnosis not present

## 2021-05-29 DIAGNOSIS — D860 Sarcoidosis of lung: Secondary | ICD-10-CM | POA: Diagnosis not present

## 2021-05-29 DIAGNOSIS — I2721 Secondary pulmonary arterial hypertension: Secondary | ICD-10-CM | POA: Diagnosis not present

## 2021-05-30 DIAGNOSIS — Z9981 Dependence on supplemental oxygen: Secondary | ICD-10-CM | POA: Diagnosis not present

## 2021-05-30 DIAGNOSIS — I5032 Chronic diastolic (congestive) heart failure: Secondary | ICD-10-CM | POA: Diagnosis not present

## 2021-05-30 DIAGNOSIS — D860 Sarcoidosis of lung: Secondary | ICD-10-CM | POA: Diagnosis not present

## 2021-05-30 DIAGNOSIS — J439 Emphysema, unspecified: Secondary | ICD-10-CM | POA: Diagnosis not present

## 2021-05-30 DIAGNOSIS — I2721 Secondary pulmonary arterial hypertension: Secondary | ICD-10-CM | POA: Diagnosis not present

## 2021-05-30 DIAGNOSIS — I110 Hypertensive heart disease with heart failure: Secondary | ICD-10-CM | POA: Diagnosis not present

## 2021-05-31 DIAGNOSIS — I5032 Chronic diastolic (congestive) heart failure: Secondary | ICD-10-CM | POA: Diagnosis not present

## 2021-05-31 DIAGNOSIS — D860 Sarcoidosis of lung: Secondary | ICD-10-CM | POA: Diagnosis not present

## 2021-05-31 DIAGNOSIS — Z9981 Dependence on supplemental oxygen: Secondary | ICD-10-CM | POA: Diagnosis not present

## 2021-05-31 DIAGNOSIS — I110 Hypertensive heart disease with heart failure: Secondary | ICD-10-CM | POA: Diagnosis not present

## 2021-05-31 DIAGNOSIS — J439 Emphysema, unspecified: Secondary | ICD-10-CM | POA: Diagnosis not present

## 2021-05-31 DIAGNOSIS — I2721 Secondary pulmonary arterial hypertension: Secondary | ICD-10-CM | POA: Diagnosis not present

## 2021-06-04 DIAGNOSIS — D860 Sarcoidosis of lung: Secondary | ICD-10-CM | POA: Diagnosis not present

## 2021-06-04 DIAGNOSIS — Z9981 Dependence on supplemental oxygen: Secondary | ICD-10-CM | POA: Diagnosis not present

## 2021-06-04 DIAGNOSIS — G250 Essential tremor: Secondary | ICD-10-CM | POA: Diagnosis not present

## 2021-06-04 DIAGNOSIS — I5032 Chronic diastolic (congestive) heart failure: Secondary | ICD-10-CM | POA: Diagnosis not present

## 2021-06-04 DIAGNOSIS — E039 Hypothyroidism, unspecified: Secondary | ICD-10-CM | POA: Diagnosis not present

## 2021-06-04 DIAGNOSIS — K219 Gastro-esophageal reflux disease without esophagitis: Secondary | ICD-10-CM | POA: Diagnosis not present

## 2021-06-04 DIAGNOSIS — Z87891 Personal history of nicotine dependence: Secondary | ICD-10-CM | POA: Diagnosis not present

## 2021-06-04 DIAGNOSIS — E440 Moderate protein-calorie malnutrition: Secondary | ICD-10-CM | POA: Diagnosis not present

## 2021-06-04 DIAGNOSIS — I2721 Secondary pulmonary arterial hypertension: Secondary | ICD-10-CM | POA: Diagnosis not present

## 2021-06-04 DIAGNOSIS — J439 Emphysema, unspecified: Secondary | ICD-10-CM | POA: Diagnosis not present

## 2021-06-04 DIAGNOSIS — I110 Hypertensive heart disease with heart failure: Secondary | ICD-10-CM | POA: Diagnosis not present

## 2021-06-05 DIAGNOSIS — Z9981 Dependence on supplemental oxygen: Secondary | ICD-10-CM | POA: Diagnosis not present

## 2021-06-05 DIAGNOSIS — I110 Hypertensive heart disease with heart failure: Secondary | ICD-10-CM | POA: Diagnosis not present

## 2021-06-05 DIAGNOSIS — I5032 Chronic diastolic (congestive) heart failure: Secondary | ICD-10-CM | POA: Diagnosis not present

## 2021-06-05 DIAGNOSIS — J439 Emphysema, unspecified: Secondary | ICD-10-CM | POA: Diagnosis not present

## 2021-06-05 DIAGNOSIS — D860 Sarcoidosis of lung: Secondary | ICD-10-CM | POA: Diagnosis not present

## 2021-06-05 DIAGNOSIS — I2721 Secondary pulmonary arterial hypertension: Secondary | ICD-10-CM | POA: Diagnosis not present

## 2021-06-10 DIAGNOSIS — D860 Sarcoidosis of lung: Secondary | ICD-10-CM | POA: Diagnosis not present

## 2021-06-10 DIAGNOSIS — J439 Emphysema, unspecified: Secondary | ICD-10-CM | POA: Diagnosis not present

## 2021-06-10 DIAGNOSIS — I110 Hypertensive heart disease with heart failure: Secondary | ICD-10-CM | POA: Diagnosis not present

## 2021-06-10 DIAGNOSIS — I2721 Secondary pulmonary arterial hypertension: Secondary | ICD-10-CM | POA: Diagnosis not present

## 2021-06-10 DIAGNOSIS — I5032 Chronic diastolic (congestive) heart failure: Secondary | ICD-10-CM | POA: Diagnosis not present

## 2021-06-10 DIAGNOSIS — Z9981 Dependence on supplemental oxygen: Secondary | ICD-10-CM | POA: Diagnosis not present

## 2021-06-11 DIAGNOSIS — I2721 Secondary pulmonary arterial hypertension: Secondary | ICD-10-CM | POA: Diagnosis not present

## 2021-06-11 DIAGNOSIS — D860 Sarcoidosis of lung: Secondary | ICD-10-CM | POA: Diagnosis not present

## 2021-06-11 DIAGNOSIS — Z9981 Dependence on supplemental oxygen: Secondary | ICD-10-CM | POA: Diagnosis not present

## 2021-06-11 DIAGNOSIS — I110 Hypertensive heart disease with heart failure: Secondary | ICD-10-CM | POA: Diagnosis not present

## 2021-06-11 DIAGNOSIS — J439 Emphysema, unspecified: Secondary | ICD-10-CM | POA: Diagnosis not present

## 2021-06-11 DIAGNOSIS — I5032 Chronic diastolic (congestive) heart failure: Secondary | ICD-10-CM | POA: Diagnosis not present

## 2021-06-17 DIAGNOSIS — D860 Sarcoidosis of lung: Secondary | ICD-10-CM | POA: Diagnosis not present

## 2021-06-17 DIAGNOSIS — I2721 Secondary pulmonary arterial hypertension: Secondary | ICD-10-CM | POA: Diagnosis not present

## 2021-06-17 DIAGNOSIS — J439 Emphysema, unspecified: Secondary | ICD-10-CM | POA: Diagnosis not present

## 2021-06-17 DIAGNOSIS — Z9981 Dependence on supplemental oxygen: Secondary | ICD-10-CM | POA: Diagnosis not present

## 2021-06-17 DIAGNOSIS — I5032 Chronic diastolic (congestive) heart failure: Secondary | ICD-10-CM | POA: Diagnosis not present

## 2021-06-17 DIAGNOSIS — I110 Hypertensive heart disease with heart failure: Secondary | ICD-10-CM | POA: Diagnosis not present

## 2021-06-23 ENCOUNTER — Other Ambulatory Visit: Payer: Self-pay | Admitting: Family Medicine

## 2021-06-24 DIAGNOSIS — I5032 Chronic diastolic (congestive) heart failure: Secondary | ICD-10-CM | POA: Diagnosis not present

## 2021-06-24 DIAGNOSIS — Z9981 Dependence on supplemental oxygen: Secondary | ICD-10-CM | POA: Diagnosis not present

## 2021-06-24 DIAGNOSIS — D860 Sarcoidosis of lung: Secondary | ICD-10-CM | POA: Diagnosis not present

## 2021-06-24 DIAGNOSIS — I110 Hypertensive heart disease with heart failure: Secondary | ICD-10-CM | POA: Diagnosis not present

## 2021-06-24 DIAGNOSIS — J439 Emphysema, unspecified: Secondary | ICD-10-CM | POA: Diagnosis not present

## 2021-06-24 DIAGNOSIS — I2721 Secondary pulmonary arterial hypertension: Secondary | ICD-10-CM | POA: Diagnosis not present

## 2021-06-25 DIAGNOSIS — J439 Emphysema, unspecified: Secondary | ICD-10-CM | POA: Diagnosis not present

## 2021-06-25 DIAGNOSIS — I110 Hypertensive heart disease with heart failure: Secondary | ICD-10-CM | POA: Diagnosis not present

## 2021-06-25 DIAGNOSIS — Z9981 Dependence on supplemental oxygen: Secondary | ICD-10-CM | POA: Diagnosis not present

## 2021-06-25 DIAGNOSIS — I5032 Chronic diastolic (congestive) heart failure: Secondary | ICD-10-CM | POA: Diagnosis not present

## 2021-06-25 DIAGNOSIS — I2721 Secondary pulmonary arterial hypertension: Secondary | ICD-10-CM | POA: Diagnosis not present

## 2021-06-25 DIAGNOSIS — D860 Sarcoidosis of lung: Secondary | ICD-10-CM | POA: Diagnosis not present

## 2021-06-28 DIAGNOSIS — J439 Emphysema, unspecified: Secondary | ICD-10-CM | POA: Diagnosis not present

## 2021-06-28 DIAGNOSIS — I2721 Secondary pulmonary arterial hypertension: Secondary | ICD-10-CM | POA: Diagnosis not present

## 2021-06-28 DIAGNOSIS — Z9981 Dependence on supplemental oxygen: Secondary | ICD-10-CM | POA: Diagnosis not present

## 2021-06-28 DIAGNOSIS — D860 Sarcoidosis of lung: Secondary | ICD-10-CM | POA: Diagnosis not present

## 2021-06-28 DIAGNOSIS — I110 Hypertensive heart disease with heart failure: Secondary | ICD-10-CM | POA: Diagnosis not present

## 2021-06-28 DIAGNOSIS — I5032 Chronic diastolic (congestive) heart failure: Secondary | ICD-10-CM | POA: Diagnosis not present

## 2021-07-04 DIAGNOSIS — K219 Gastro-esophageal reflux disease without esophagitis: Secondary | ICD-10-CM | POA: Diagnosis not present

## 2021-07-04 DIAGNOSIS — E440 Moderate protein-calorie malnutrition: Secondary | ICD-10-CM | POA: Diagnosis not present

## 2021-07-04 DIAGNOSIS — I2721 Secondary pulmonary arterial hypertension: Secondary | ICD-10-CM | POA: Diagnosis not present

## 2021-07-04 DIAGNOSIS — D860 Sarcoidosis of lung: Secondary | ICD-10-CM | POA: Diagnosis not present

## 2021-07-04 DIAGNOSIS — Z87891 Personal history of nicotine dependence: Secondary | ICD-10-CM | POA: Diagnosis not present

## 2021-07-04 DIAGNOSIS — G250 Essential tremor: Secondary | ICD-10-CM | POA: Diagnosis not present

## 2021-07-04 DIAGNOSIS — E039 Hypothyroidism, unspecified: Secondary | ICD-10-CM | POA: Diagnosis not present

## 2021-07-04 DIAGNOSIS — I110 Hypertensive heart disease with heart failure: Secondary | ICD-10-CM | POA: Diagnosis not present

## 2021-07-04 DIAGNOSIS — I5032 Chronic diastolic (congestive) heart failure: Secondary | ICD-10-CM | POA: Diagnosis not present

## 2021-07-04 DIAGNOSIS — Z9981 Dependence on supplemental oxygen: Secondary | ICD-10-CM | POA: Diagnosis not present

## 2021-07-04 DIAGNOSIS — J439 Emphysema, unspecified: Secondary | ICD-10-CM | POA: Diagnosis not present

## 2021-07-09 ENCOUNTER — Other Ambulatory Visit: Payer: Self-pay | Admitting: Family Medicine

## 2021-07-09 DIAGNOSIS — K219 Gastro-esophageal reflux disease without esophagitis: Secondary | ICD-10-CM

## 2021-07-11 DIAGNOSIS — Z9981 Dependence on supplemental oxygen: Secondary | ICD-10-CM | POA: Diagnosis not present

## 2021-07-11 DIAGNOSIS — I2721 Secondary pulmonary arterial hypertension: Secondary | ICD-10-CM | POA: Diagnosis not present

## 2021-07-11 DIAGNOSIS — I110 Hypertensive heart disease with heart failure: Secondary | ICD-10-CM | POA: Diagnosis not present

## 2021-07-11 DIAGNOSIS — J439 Emphysema, unspecified: Secondary | ICD-10-CM | POA: Diagnosis not present

## 2021-07-11 DIAGNOSIS — D860 Sarcoidosis of lung: Secondary | ICD-10-CM | POA: Diagnosis not present

## 2021-07-11 DIAGNOSIS — I5032 Chronic diastolic (congestive) heart failure: Secondary | ICD-10-CM | POA: Diagnosis not present

## 2021-07-13 DIAGNOSIS — J439 Emphysema, unspecified: Secondary | ICD-10-CM | POA: Diagnosis not present

## 2021-07-13 DIAGNOSIS — I2721 Secondary pulmonary arterial hypertension: Secondary | ICD-10-CM | POA: Diagnosis not present

## 2021-07-13 DIAGNOSIS — I110 Hypertensive heart disease with heart failure: Secondary | ICD-10-CM | POA: Diagnosis not present

## 2021-07-13 DIAGNOSIS — I5032 Chronic diastolic (congestive) heart failure: Secondary | ICD-10-CM | POA: Diagnosis not present

## 2021-07-13 DIAGNOSIS — Z9981 Dependence on supplemental oxygen: Secondary | ICD-10-CM | POA: Diagnosis not present

## 2021-07-13 DIAGNOSIS — D860 Sarcoidosis of lung: Secondary | ICD-10-CM | POA: Diagnosis not present

## 2021-07-16 DIAGNOSIS — J439 Emphysema, unspecified: Secondary | ICD-10-CM | POA: Diagnosis not present

## 2021-07-16 DIAGNOSIS — I110 Hypertensive heart disease with heart failure: Secondary | ICD-10-CM | POA: Diagnosis not present

## 2021-07-16 DIAGNOSIS — I5032 Chronic diastolic (congestive) heart failure: Secondary | ICD-10-CM | POA: Diagnosis not present

## 2021-07-16 DIAGNOSIS — I2721 Secondary pulmonary arterial hypertension: Secondary | ICD-10-CM | POA: Diagnosis not present

## 2021-07-16 DIAGNOSIS — D860 Sarcoidosis of lung: Secondary | ICD-10-CM | POA: Diagnosis not present

## 2021-07-16 DIAGNOSIS — Z9981 Dependence on supplemental oxygen: Secondary | ICD-10-CM | POA: Diagnosis not present

## 2021-07-17 ENCOUNTER — Telehealth (HOSPITAL_COMMUNITY): Payer: Self-pay | Admitting: Pharmacist

## 2021-07-17 ENCOUNTER — Other Ambulatory Visit (HOSPITAL_COMMUNITY): Payer: Self-pay

## 2021-07-17 NOTE — Telephone Encounter (Signed)
Advanced Heart Failure Patient Advocate Encounter  Prior Authorizations for Uptravi and tadalafil have been approved.    Effective dates: 08/04/21 through 08/03/22  Audry Riles, PharmD, BCPS, BCCP, CPP Heart Failure Clinic Pharmacist [phone]

## 2021-07-17 NOTE — Telephone Encounter (Signed)
Patient Advocate Encounter   Received notification from [employer] that prior authorizations for Uptravi and tadalafil are required.   PAs submitted on CoverMyMeds Status is pending   Will continue to follow.   Audry Riles, PharmD, BCPS, BCCP, CPP Heart Failure Clinic Pharmacist [phone]

## 2021-07-24 DIAGNOSIS — I2721 Secondary pulmonary arterial hypertension: Secondary | ICD-10-CM | POA: Diagnosis not present

## 2021-07-24 DIAGNOSIS — Z9981 Dependence on supplemental oxygen: Secondary | ICD-10-CM | POA: Diagnosis not present

## 2021-07-24 DIAGNOSIS — I5032 Chronic diastolic (congestive) heart failure: Secondary | ICD-10-CM | POA: Diagnosis not present

## 2021-07-24 DIAGNOSIS — J439 Emphysema, unspecified: Secondary | ICD-10-CM | POA: Diagnosis not present

## 2021-07-24 DIAGNOSIS — D860 Sarcoidosis of lung: Secondary | ICD-10-CM | POA: Diagnosis not present

## 2021-07-24 DIAGNOSIS — I110 Hypertensive heart disease with heart failure: Secondary | ICD-10-CM | POA: Diagnosis not present

## 2021-07-30 DIAGNOSIS — I2721 Secondary pulmonary arterial hypertension: Secondary | ICD-10-CM | POA: Diagnosis not present

## 2021-07-30 DIAGNOSIS — I5032 Chronic diastolic (congestive) heart failure: Secondary | ICD-10-CM | POA: Diagnosis not present

## 2021-07-30 DIAGNOSIS — J439 Emphysema, unspecified: Secondary | ICD-10-CM | POA: Diagnosis not present

## 2021-07-30 DIAGNOSIS — D860 Sarcoidosis of lung: Secondary | ICD-10-CM | POA: Diagnosis not present

## 2021-07-30 DIAGNOSIS — Z9981 Dependence on supplemental oxygen: Secondary | ICD-10-CM | POA: Diagnosis not present

## 2021-07-30 DIAGNOSIS — I110 Hypertensive heart disease with heart failure: Secondary | ICD-10-CM | POA: Diagnosis not present

## 2021-08-01 DIAGNOSIS — J439 Emphysema, unspecified: Secondary | ICD-10-CM | POA: Diagnosis not present

## 2021-08-01 DIAGNOSIS — Z9981 Dependence on supplemental oxygen: Secondary | ICD-10-CM | POA: Diagnosis not present

## 2021-08-01 DIAGNOSIS — I110 Hypertensive heart disease with heart failure: Secondary | ICD-10-CM | POA: Diagnosis not present

## 2021-08-01 DIAGNOSIS — D860 Sarcoidosis of lung: Secondary | ICD-10-CM | POA: Diagnosis not present

## 2021-08-01 DIAGNOSIS — I2721 Secondary pulmonary arterial hypertension: Secondary | ICD-10-CM | POA: Diagnosis not present

## 2021-08-01 DIAGNOSIS — I5032 Chronic diastolic (congestive) heart failure: Secondary | ICD-10-CM | POA: Diagnosis not present

## 2021-08-04 DIAGNOSIS — D860 Sarcoidosis of lung: Secondary | ICD-10-CM | POA: Diagnosis not present

## 2021-08-04 DIAGNOSIS — I110 Hypertensive heart disease with heart failure: Secondary | ICD-10-CM | POA: Diagnosis not present

## 2021-08-04 DIAGNOSIS — I2721 Secondary pulmonary arterial hypertension: Secondary | ICD-10-CM | POA: Diagnosis not present

## 2021-08-04 DIAGNOSIS — G250 Essential tremor: Secondary | ICD-10-CM | POA: Diagnosis not present

## 2021-08-04 DIAGNOSIS — I5032 Chronic diastolic (congestive) heart failure: Secondary | ICD-10-CM | POA: Diagnosis not present

## 2021-08-04 DIAGNOSIS — Z9981 Dependence on supplemental oxygen: Secondary | ICD-10-CM | POA: Diagnosis not present

## 2021-08-04 DIAGNOSIS — E039 Hypothyroidism, unspecified: Secondary | ICD-10-CM | POA: Diagnosis not present

## 2021-08-04 DIAGNOSIS — J439 Emphysema, unspecified: Secondary | ICD-10-CM | POA: Diagnosis not present

## 2021-08-04 DIAGNOSIS — Z87891 Personal history of nicotine dependence: Secondary | ICD-10-CM | POA: Diagnosis not present

## 2021-08-04 DIAGNOSIS — K219 Gastro-esophageal reflux disease without esophagitis: Secondary | ICD-10-CM | POA: Diagnosis not present

## 2021-08-04 DIAGNOSIS — E440 Moderate protein-calorie malnutrition: Secondary | ICD-10-CM | POA: Diagnosis not present

## 2021-08-07 ENCOUNTER — Other Ambulatory Visit (HOSPITAL_COMMUNITY): Payer: Self-pay

## 2021-08-08 DIAGNOSIS — Z9981 Dependence on supplemental oxygen: Secondary | ICD-10-CM | POA: Diagnosis not present

## 2021-08-08 DIAGNOSIS — I2721 Secondary pulmonary arterial hypertension: Secondary | ICD-10-CM | POA: Diagnosis not present

## 2021-08-08 DIAGNOSIS — I5032 Chronic diastolic (congestive) heart failure: Secondary | ICD-10-CM | POA: Diagnosis not present

## 2021-08-08 DIAGNOSIS — J439 Emphysema, unspecified: Secondary | ICD-10-CM | POA: Diagnosis not present

## 2021-08-08 DIAGNOSIS — D860 Sarcoidosis of lung: Secondary | ICD-10-CM | POA: Diagnosis not present

## 2021-08-08 DIAGNOSIS — I110 Hypertensive heart disease with heart failure: Secondary | ICD-10-CM | POA: Diagnosis not present

## 2021-08-16 ENCOUNTER — Telehealth: Payer: Self-pay | Admitting: Pulmonary Disease

## 2021-08-19 NOTE — Telephone Encounter (Signed)
Spoke with [name] - offered condolences. She will plan to drop off four unopened bottles of Uptravi. Will transfer to [facility] once received for donation stock  Knox Saliva, PharmD, MPH, BCPS Clinical Pharmacist (Rheumatology and Pulmonology)

## 2021-09-04 DEATH — deceased
